# Patient Record
Sex: Female | Born: 1965
Health system: Southern US, Community
[De-identification: ages and names within clinical notes are randomized; demographics above are authoritative.]

## PROBLEM LIST (undated history)

## (undated) ENCOUNTER — Emergency Department (HOSPITAL_BASED_OUTPATIENT_CLINIC_OR_DEPARTMENT_OTHER)
Admission: EM | Disposition: A | Discharge status: Home / Self Care | Payer: Self-pay | Attending: Emergency Medicine | Admitting: Emergency Medicine

## (undated) DIAGNOSIS — F329 Major depressive disorder, single episode, unspecified: Secondary | ICD-10-CM

## (undated) DIAGNOSIS — E785 Hyperlipidemia, unspecified: Secondary | ICD-10-CM

## (undated) DIAGNOSIS — E669 Obesity, unspecified: Secondary | ICD-10-CM

## (undated) DIAGNOSIS — F32A Depression, unspecified: Secondary | ICD-10-CM

## (undated) DIAGNOSIS — K859 Acute pancreatitis without necrosis or infection, unspecified: Secondary | ICD-10-CM

## (undated) DIAGNOSIS — E119 Type 2 diabetes mellitus without complications: Secondary | ICD-10-CM

## (undated) DIAGNOSIS — J45909 Unspecified asthma, uncomplicated: Secondary | ICD-10-CM

## (undated) DIAGNOSIS — D496 Neoplasm of unspecified behavior of brain: Secondary | ICD-10-CM

## (undated) DIAGNOSIS — Z8719 Personal history of other diseases of the digestive system: Secondary | ICD-10-CM

## (undated) DIAGNOSIS — G629 Polyneuropathy, unspecified: Secondary | ICD-10-CM

## (undated) DIAGNOSIS — I10 Essential (primary) hypertension: Secondary | ICD-10-CM

## (undated) DIAGNOSIS — L0390 Cellulitis, unspecified: Secondary | ICD-10-CM

## (undated) HISTORY — PX: SHOULDER ARTHROSCOPY W/ ROTATOR CUFF REPAIR: SHX2400

## (undated) HISTORY — PX: ABDOMINAL HYSTERECTOMY: SHX81

## (undated) HISTORY — PX: BREAST CYST EXCISION: SHX579

---

## 1999-10-17 ENCOUNTER — Other Ambulatory Visit: Admission: RE | Admit: 1999-10-17 | Discharge: 1999-10-17 | Payer: Self-pay | Admitting: Obstetrics

## 1999-10-25 ENCOUNTER — Ambulatory Visit (HOSPITAL_COMMUNITY): Admission: RE | Admit: 1999-10-25 | Discharge: 1999-10-25 | Payer: Self-pay | Admitting: Obstetrics

## 1999-10-25 HISTORY — PX: TUBAL LIGATION: SHX77

## 2001-08-08 ENCOUNTER — Emergency Department (HOSPITAL_COMMUNITY): Admission: EM | Admit: 2001-08-08 | Discharge: 2001-08-08 | Payer: Self-pay | Admitting: Emergency Medicine

## 2002-12-16 ENCOUNTER — Emergency Department (HOSPITAL_COMMUNITY): Admission: EM | Admit: 2002-12-16 | Discharge: 2002-12-17 | Payer: Self-pay | Admitting: Emergency Medicine

## 2008-11-07 ENCOUNTER — Emergency Department (HOSPITAL_COMMUNITY): Admission: EM | Admit: 2008-11-07 | Discharge: 2008-11-08 | Payer: Self-pay | Admitting: Emergency Medicine

## 2010-12-08 LAB — BASIC METABOLIC PANEL
BUN: 7 mg/dL (ref 6–23)
CO2: 30 mEq/L (ref 19–32)
Calcium: 9.3 mg/dL (ref 8.4–10.5)
Chloride: 96 mEq/L (ref 96–112)
Creatinine, Ser: 0.62 mg/dL (ref 0.4–1.2)
GFR calc Af Amer: >60 mL/min (ref >60)
GFR calc non Af Amer: >60 mL/min (ref >60)
Glucose, Bld: 173 mg/dL — ABNORMAL HIGH (ref 70–99)
Potassium: 2.8 mEq/L — ABNORMAL LOW (ref 3.5–5.1)
Sodium: 136 mEq/L (ref 135–145)

## 2010-12-08 LAB — DIFFERENTIAL
Basophils Absolute: 0.1 10*3/uL (ref 0.0–0.1)
Basophils Relative: 1 % (ref 0–1)
Eosinophils Absolute: 0.1 10*3/uL (ref 0.0–0.7)
Eosinophils Relative: 2 % (ref 0–5)
Lymphocytes Relative: 26 % (ref 12–46)
Lymphs Abs: 1.9 10*3/uL (ref 0.7–4.0)
Monocytes Absolute: 0.6 10*3/uL (ref 0.1–1.0)
Monocytes Relative: 8 % (ref 3–12)
Neutro Abs: 4.7 10*3/uL (ref 1.7–7.7)
Neutrophils Relative %: 64 % (ref 43–77)

## 2010-12-08 LAB — CBC
HCT: 48.3 % — ABNORMAL HIGH (ref 36.0–46.0)
Hemoglobin: 16.9 g/dL — ABNORMAL HIGH (ref 12.0–15.0)
MCHC: 35 g/dL (ref 30.0–36.0)
MCV: 85.6 fL (ref 78.0–100.0)
Platelets: 349 10*3/uL (ref 150–400)
RBC: 5.64 MIL/uL — ABNORMAL HIGH (ref 3.87–5.11)
RDW: 12.8 % (ref 11.5–15.5)
WBC: 7.4 10*3/uL (ref 4.0–10.5)

## 2010-12-08 LAB — GLUCOSE, CAPILLARY: Glucose-Capillary: 206 mg/dL — ABNORMAL HIGH (ref 70–99)

## 2011-01-13 NOTE — Op Note (Signed)
[facility]  Patient:    Barbara Wang, Barbara Wang                MRN: [id number] Proc. Date: 10/25/99 Adm. Date:  [id number] Attending:  Venita Sheffield                           Operative Report  PREOPERATIVE DIAGNOSIS:       Multiparity.  POSTOPERATIVE DIAGNOSIS:  PROCEDURE:                    Open laparoscopic tubal sterilization.  SURGEON:                      Kathreen Cosier, M.D.  ANESTHESIA:                   General anesthesia.  DESCRIPTION OF PROCEDURE:     Under general anesthesia, patient in lithotomy position, abdomen, perineum and vagina were prepped and draped and bladder emptied with a straight catheter.  A weighted speculum was placed in the vagina and a Hulka tenaculum inserted in the cervix.  In the umbilicus, a transverse incision was ade and carried down to the fascia.  The fascia was cleaned and grasped with two Kochers and the fascia and the peritoneum opened with the Mayo scissors.  The sleeve of the trocar was inserted intraperitoneally.  Three liters of CO2 were infused intraperitoneally.  The visualizing scope was inserted through the sleeve of the scope; uterus, tubes and ovaries were normal.  The cautery probe was inserted through the sleeve of the scope and the right tube grasped one inch from the cornu.  The tube was traced to the fimbria.  Starting one inch from the cornu, the tube was cauterized; it was cauterized in a total of five places, moving lateral from the first site of cautery; approximately two inches of tube cauterized.  The procedure was done in the exact fashion on the other side. Probes was removed, CO2 allowed to escape from the peritoneal cavity, the fascia closed with one stitch of 0 Dexon and the skin closed with subcuticular stitch of 3-0 plain.  Patient tolerated her procedure well and taken to recovery room in good  condition. DD:  10/25/99 TD:  10/26/99 Job:  [id number]

## 2011-06-03 ENCOUNTER — Inpatient Hospital Stay (INDEPENDENT_AMBULATORY_CARE_PROVIDER_SITE_OTHER)
Admission: RE | Admit: 2011-06-03 | Discharge: 2011-06-03 | Disposition: A | Discharge status: Home / Self Care | Payer: Self-pay | Source: Ambulatory Visit | Attending: Emergency Medicine | Admitting: Emergency Medicine

## 2011-06-03 DIAGNOSIS — K089 Disorder of teeth and supporting structures, unspecified: Secondary | ICD-10-CM

## 2011-10-16 ENCOUNTER — Emergency Department (INDEPENDENT_AMBULATORY_CARE_PROVIDER_SITE_OTHER): Payer: Self-pay

## 2011-10-16 ENCOUNTER — Emergency Department (HOSPITAL_COMMUNITY)
Admission: EM | Admit: 2011-10-16 | Discharge: 2011-10-16 | Disposition: A | Discharge status: Home / Self Care | Payer: Self-pay | Source: Home / Self Care

## 2011-10-16 ENCOUNTER — Emergency Department (HOSPITAL_COMMUNITY): Payer: Self-pay

## 2011-10-16 ENCOUNTER — Encounter (HOSPITAL_COMMUNITY): Payer: Self-pay | Admitting: *Deleted

## 2011-10-16 DIAGNOSIS — S93409A Sprain of unspecified ligament of unspecified ankle, initial encounter: Secondary | ICD-10-CM

## 2011-10-16 DIAGNOSIS — J039 Acute tonsillitis, unspecified: Secondary | ICD-10-CM

## 2011-10-16 HISTORY — DX: Essential (primary) hypertension: I10

## 2011-10-16 LAB — POCT RAPID STREP A: Streptococcus, Group A Screen (Direct): NEGATIVE

## 2011-10-16 MED ORDER — AMOXICILLIN 500 MG PO CAPS: 500.0000 mg | ORAL_CAPSULE | Freq: Two times a day (BID) | ORAL | Status: AC

## 2011-10-16 MED ORDER — TRAMADOL HCL 50 MG PO TABS: 50.0000 mg | ORAL_TABLET | Freq: Four times a day (QID) | ORAL | Status: AC | PRN

## 2011-10-16 NOTE — ED Provider Notes (Signed)
Barbara Wang is a 46 y.o. female who presents to Urgent Care today for sore throat and for a fall.  Sore throat has been present x 1 day only.  Pain radiates to Left ear.  She has no preceding URI or other illness.  Eating and drinking well, handling secretions, no difficulty breathing.  She also suffered a fall 3 days ago.  Was out walking in front yard, slipped on low wall, fell on ground.  She was able to stand and walk back in house.  She has been using boots since then for ankle support.  Taking Ibuprofen with inconsistent relief.  She has also been using ice for relief.  Wants to make sure she did not suffer fracture.    Also of note, blood pressure elevated here at Urgent Care, see A&P below.  PMH reviewed.  ROS as above otherwise neg Medications reviewed. No current facility-administered medications for this encounter.   Current Outpatient Prescriptions  Medication Sig Dispense Refill  . insulin aspart (NOVOLOG) 100 UNIT/ML injection Inject into the skin. Sliding scale      . insulin glargine (LANTUS) 100 UNIT/ML injection Inject 60 Units into the skin 2 (two) times daily.      Marland Kitchen lisinopril-hydrochlorothiazide (PRINZIDE,ZESTORETIC) 10-12.5 MG per tablet Take 1 tablet by mouth daily.        Exam:  BP 178/95  Pulse 88  Temp(Src) 98.4 F (36.9 C) (Oral)  Resp 20  SpO2 99% Gen: Well NAD HEENT: EOMI,  MMM.  Left tonsil with erythema and white exudates noted, moderately enlarged.  Right tonsil WNL.  Left TM erythematous, although able to visualize landmarks beyond TM.  Ear canal clear.  Right ear WNL.   Lungs: CTABL Nl WOB Heart: RRR no MRG Abd: NABS, NT, ND Exts: Non edematous BL  LE, warm and well perfused.   Assessment and Plan: 1.  Tonsillitis:  Likely diagnosis based on presentation and fact she has erythema and exudates noted Left tonsil.  Plan short term course of Amoxicillin for relief.  FU if no improvement or symptoms worsen.  She is talking well and handling  secretions/airway well, do not think anything like peritonsillar abscess.    2.  Ankle sprain:  Rest, ice, compression, elevation for relief.  Also prescribed Tramadol for pain relief.  She is to continue weight bearing as tolerated.  To FU with PCP in 2 weeks to assess for improvement, or with Sports Medicine or [place] sooner if no improvement or symptoms worsen.  Able to bear weight both immediately and here at Sports Medicine, however she does have some mild tenderness over Right lateral malleolus.  X-rays negative.  Fracture unlikely.   3.  Elevated blood pressure:  Patient has not taken BP meds for several days.  Recommended she take one at home today and take them regularly.  No red flags by history or exam.  FU with PCP.

## 2011-10-16 NOTE — ED Notes (Addendum)
Reports stumbling over landscape border on Saturday - c/o continued right ankle and dorsal foot tenderness and painful weight-bearing.  Has also had productive cough x 1 wk; started w/ sore throat last night and severe left earache today.  Has felt feverish.  Has been taking Tyl, Tyl PM, and Nyquil without relief.  Exudate noted on tonsils.

## 2011-10-16 NOTE — Discharge Instructions (Signed)
Tonsillitis Tonsils are lumps of lymphoid tissues at the back of the throat. Each tonsil has 20 crevices (crypts). Tonsils help fight nose and throat infections and keep infection from spreading to other parts of the body for the first 18 months of life. Tonsillitis is an infection of the throat that causes the tonsils to become red, tender, and swollen. CAUSES Sudden and, if treated, temporary (acute) tonsillitis is usually caused by infection with streptococcal bacteria. Long lasting (chronic) tonsillitis occurs when the crypts of the tonsils become filled with pieces of food and bacteria, which makes it easy for the tonsils to become constantly infected. SYMPTOMS  Symptoms of tonsillitis include:  A sore throat.   White patches on the tonsils.   Fever.   Tiredness.  DIAGNOSIS Tonsillitis can be diagnosed through a physical exam. Diagnosis can be confirmed with the results of lab tests, including a throat culture. TREATMENT  The goals of tonsillitis treatment include the reduction of the severity and duration of symptoms, prevention of associated conditions, and prevention of disease transmission. Tonsillitis caused by bacteria can be treated with antibiotics. Usually, treatment with antibiotics is started before the cause of the tonsillitis is known. However, if it is determined that the cause is not bacterial, antibiotics will not treat the tonsillitis. If attacks of tonsillitis are severe and frequent, your caregiver may recommend surgery to remove the tonsils (tonsillectomy). HOME CARE INSTRUCTIONS   Rest as much as possible and get plenty of sleep.   Drink plenty of fluids. While the throat is very sore, eat soft foods or liquids, such as sherbet, soups, or instant breakfast drinks.   Eat frozen ice pops.   Older children and adults may gargle with a warm or cold liquid to help soothe the throat. Mix 1 teaspoon of salt in 1 cup of water.   Other family members who also develop a  sore throat or fever should have a medical exam or throat culture.   Only take over-the-counter or prescription medicines for pain, discomfort, or fever as directed by your caregiver.  SEEK MEDICAL CARE IF:   Your baby is older than 3 months with a rectal temperature of 100.5 F (38.1 C) or higher for more than 1 day.   Large, tender lumps develop in your neck.   A rash develops.   Green, yellow-brown, or bloody substance is coughed up.   You are unable to swallow liquids or food for 24 hours.   Your child is unable to swallow food or liquids for 12 hours.  SEEK IMMEDIATE MEDICAL CARE IF:   You develop any new symptoms such as vomiting, severe headache, stiff neck, chest pain, or trouble breathing or swallowing.   You have severe throat pain along with drooling or voice changes.   You have severe pain, unrelieved with recommended medications.   You are unable to fully open the mouth.   You develop redness, swelling, or severe pain anywhere in the neck.   You have a fever.   Your baby is older than 3 months with a rectal temperature of 102 F (38.9 C) or higher.   Your baby is 6 months old or younger with a rectal temperature of 100.4 F (38 C) or higher.  MAKE SURE YOU:   Understand these instructions.   Will watch your condition.   Will get help right away if you are not doing well or get worse.  Document Released: 05/24/2005 Document Revised: 04/26/2011 Document Reviewed: 10/20/2010 [facility] Patient Information 2012 [employer]  Ankle Sprain You have a sprained ankle. When you twist or sprain your ankle, the ligaments that hold the joint together are injured. This usually causes a lot of swelling and pain. Although these injuries can be quite severe, proper treatment will reduce your pain, shorten the period of disability, and help prevent re-injury. To treat a sprained ankle you should:  Elevate your ankle for the next 2 to 4 days.   During this period apply  ice packs to the injury for 20 to 30 minutes every 2 to 3 hours.   Keep the ankle wrapped in a compression bandage or splint as long as it is painful or swollen.   Do not walk on your ankle if it still hurts. This can slow the healing. Gentle range of motion exercises, however, can help decrease disability.   Use crutches if necessary until weight bearing is painless.   Prescription pain medicine may be needed to relieve discomfort.  A plaster or fiberglass splint may be applied initially. As your sprain improves, air, foam or gel-lined braces can be used to protect the ankle from further injury until the joint is completely healed. Ankle rehabilitation exercises may also be used to speed your recovery and make the joint more stable. Most moderate ankle sprains will heal completely in 6 weeks. However, if the sprain is severe, a cast or even surgery may be needed. Restrict your activities and see your doctor for follow-up as advised. If you have persistent pain, further evaluation and x-rays may be needed. Document Released: 09/21/2004 Document Revised: 04/26/2011 Document Reviewed: 08/15/2008 [facility] Patient Information 2012 [place], [place].

## 2011-10-18 NOTE — ED Provider Notes (Signed)
Medical screening examination/treatment/procedure(s) were performed by resident physician and as supervising physician I was immediately available for consultation/collaboration.  Richardo Priest, MD  Richardo Priest, MD 10/18/11 2249

## 2012-04-29 ENCOUNTER — Emergency Department (HOSPITAL_COMMUNITY)
Admission: EM | Admit: 2012-04-29 | Discharge: 2012-04-29 | Disposition: A | Discharge status: Home / Self Care | Payer: Self-pay | Attending: Emergency Medicine | Admitting: Emergency Medicine

## 2012-04-29 ENCOUNTER — Encounter (HOSPITAL_COMMUNITY): Payer: Self-pay | Admitting: Family Medicine

## 2012-04-29 DIAGNOSIS — K859 Acute pancreatitis without necrosis or infection, unspecified: Secondary | ICD-10-CM | POA: Insufficient documentation

## 2012-04-29 DIAGNOSIS — R109 Unspecified abdominal pain: Secondary | ICD-10-CM | POA: Insufficient documentation

## 2012-04-29 HISTORY — DX: Acute pancreatitis without necrosis or infection, unspecified: K85.90

## 2012-04-29 LAB — URINALYSIS, ROUTINE W REFLEX MICROSCOPIC
Bilirubin Urine: NEGATIVE
Glucose, UA: >1000 mg/dL — AB
Hgb urine dipstick: NEGATIVE
Ketones, ur: 15 mg/dL — AB
Nitrite: NEGATIVE
Protein, ur: NEGATIVE mg/dL
Specific Gravity, Urine: 1.019 (ref 1.005–1.030)
Urobilinogen, UA: 0.2 mg/dL (ref 0.0–1.0)
pH: 6 (ref 5.0–8.0)

## 2012-04-29 LAB — COMPREHENSIVE METABOLIC PANEL
ALT: 18 U/L (ref 0–35)
AST: 27 U/L (ref 0–37)
Albumin: 3.5 g/dL (ref 3.5–5.2)
Alkaline Phosphatase: 103 U/L (ref 39–117)
BUN: 10 mg/dL (ref 6–23)
CO2: 30 mEq/L (ref 19–32)
Calcium: 9.9 mg/dL (ref 8.4–10.5)
Chloride: 97 mEq/L (ref 96–112)
Creatinine, Ser: 0.59 mg/dL (ref 0.50–1.10)
GFR calc Af Amer: >90 mL/min (ref >90)
GFR calc non Af Amer: >90 mL/min (ref >90)
Glucose, Bld: 304 mg/dL — ABNORMAL HIGH (ref 70–99)
Potassium: 3.5 mEq/L (ref 3.5–5.1)
Sodium: 135 mEq/L (ref 135–145)
Total Bilirubin: 0.4 mg/dL (ref 0.3–1.2)
Total Protein: 7.7 g/dL (ref 6.0–8.3)

## 2012-04-29 LAB — CBC WITH DIFFERENTIAL/PLATELET
Basophils Absolute: 0 10*3/uL (ref 0.0–0.1)
Basophils Relative: 1 % (ref 0–1)
Eosinophils Absolute: 0.3 10*3/uL (ref 0.0–0.7)
Eosinophils Relative: 4 % (ref 0–5)
HCT: 39.3 % (ref 36.0–46.0)
Hemoglobin: 13.3 g/dL (ref 12.0–15.0)
Lymphocytes Relative: 31 % (ref 12–46)
Lymphs Abs: 2 10*3/uL (ref 0.7–4.0)
MCH: 29.2 pg (ref 26.0–34.0)
MCHC: 33.8 g/dL (ref 30.0–36.0)
MCV: 86.2 fL (ref 78.0–100.0)
Monocytes Absolute: 0.7 10*3/uL (ref 0.1–1.0)
Monocytes Relative: 10 % (ref 3–12)
Neutro Abs: 3.4 10*3/uL (ref 1.7–7.7)
Neutrophils Relative %: 54 % (ref 43–77)
Platelets: 405 10*3/uL — ABNORMAL HIGH (ref 150–400)
RBC: 4.56 MIL/uL (ref 3.87–5.11)
RDW: 13.2 % (ref 11.5–15.5)
WBC: 6.3 10*3/uL (ref 4.0–10.5)

## 2012-04-29 LAB — URINE MICROSCOPIC-ADD ON

## 2012-04-29 LAB — LIPASE, BLOOD: Lipase: 84 U/L — ABNORMAL HIGH (ref 11–59)

## 2012-04-29 NOTE — ED Notes (Addendum)
Pt complaining of upper abdominal pain x 1 month. Hx of pancreatitis. N,V. Recently admitted for the same.

## 2012-05-09 ENCOUNTER — Emergency Department (HOSPITAL_COMMUNITY): Payer: Self-pay

## 2012-05-09 ENCOUNTER — Inpatient Hospital Stay (HOSPITAL_COMMUNITY)
Admission: EM | Admit: 2012-05-09 | Discharge: 2012-05-12 | DRG: 417 | Disposition: A | Discharge status: Home / Self Care | Payer: MEDICAID | Attending: Internal Medicine | Admitting: Internal Medicine

## 2012-05-09 ENCOUNTER — Encounter (HOSPITAL_COMMUNITY): Payer: Self-pay | Admitting: Emergency Medicine

## 2012-05-09 DIAGNOSIS — F17200 Nicotine dependence, unspecified, uncomplicated: Secondary | ICD-10-CM | POA: Diagnosis present

## 2012-05-09 DIAGNOSIS — Z90710 Acquired absence of both cervix and uterus: Secondary | ICD-10-CM

## 2012-05-09 DIAGNOSIS — IMO0001 Reserved for inherently not codable concepts without codable children: Secondary | ICD-10-CM | POA: Diagnosis present

## 2012-05-09 DIAGNOSIS — F329 Major depressive disorder, single episode, unspecified: Secondary | ICD-10-CM | POA: Diagnosis present

## 2012-05-09 DIAGNOSIS — E876 Hypokalemia: Secondary | ICD-10-CM | POA: Diagnosis present

## 2012-05-09 DIAGNOSIS — K828 Other specified diseases of gallbladder: Principal | ICD-10-CM | POA: Diagnosis present

## 2012-05-09 DIAGNOSIS — F3289 Other specified depressive episodes: Secondary | ICD-10-CM | POA: Diagnosis present

## 2012-05-09 DIAGNOSIS — R109 Unspecified abdominal pain: Secondary | ICD-10-CM | POA: Diagnosis present

## 2012-05-09 DIAGNOSIS — E119 Type 2 diabetes mellitus without complications: Secondary | ICD-10-CM

## 2012-05-09 DIAGNOSIS — Z882 Allergy status to sulfonamides status: Secondary | ICD-10-CM

## 2012-05-09 DIAGNOSIS — K859 Acute pancreatitis without necrosis or infection, unspecified: Secondary | ICD-10-CM | POA: Diagnosis present

## 2012-05-09 DIAGNOSIS — I10 Essential (primary) hypertension: Secondary | ICD-10-CM | POA: Diagnosis present

## 2012-05-09 DIAGNOSIS — Z794 Long term (current) use of insulin: Secondary | ICD-10-CM

## 2012-05-09 DIAGNOSIS — Z880 Allergy status to penicillin: Secondary | ICD-10-CM

## 2012-05-09 LAB — COMPREHENSIVE METABOLIC PANEL
ALT: 13 U/L (ref 0–35)
AST: 16 U/L (ref 0–37)
Albumin: 3.5 g/dL (ref 3.5–5.2)
Alkaline Phosphatase: 79 U/L (ref 39–117)
BUN: 6 mg/dL (ref 6–23)
CO2: 27 mEq/L (ref 19–32)
Calcium: 9.8 mg/dL (ref 8.4–10.5)
Chloride: 101 mEq/L (ref 96–112)
Creatinine, Ser: 0.49 mg/dL — ABNORMAL LOW (ref 0.50–1.10)
GFR calc Af Amer: >90 mL/min (ref >90)
GFR calc non Af Amer: >90 mL/min (ref >90)
Glucose, Bld: 269 mg/dL — ABNORMAL HIGH (ref 70–99)
Potassium: 3.3 mEq/L — ABNORMAL LOW (ref 3.5–5.1)
Sodium: 138 mEq/L (ref 135–145)
Total Bilirubin: 0.3 mg/dL (ref 0.3–1.2)
Total Protein: 7.3 g/dL (ref 6.0–8.3)

## 2012-05-09 LAB — CBC WITH DIFFERENTIAL/PLATELET
Basophils Absolute: 0 10*3/uL (ref 0.0–0.1)
Basophils Relative: 0 % (ref 0–1)
Eosinophils Absolute: 0.1 10*3/uL (ref 0.0–0.7)
Eosinophils Relative: 1 % (ref 0–5)
HCT: 39.4 % (ref 36.0–46.0)
Hemoglobin: 13.8 g/dL (ref 12.0–15.0)
Lymphocytes Relative: 22 % (ref 12–46)
Lymphs Abs: 1.7 10*3/uL (ref 0.7–4.0)
MCH: 29.6 pg (ref 26.0–34.0)
MCHC: 35 g/dL (ref 30.0–36.0)
MCV: 84.4 fL (ref 78.0–100.0)
Monocytes Absolute: 0.5 10*3/uL (ref 0.1–1.0)
Monocytes Relative: 7 % (ref 3–12)
Neutro Abs: 5.3 10*3/uL (ref 1.7–7.7)
Neutrophils Relative %: 70 % (ref 43–77)
Platelets: 388 10*3/uL (ref 150–400)
RBC: 4.67 MIL/uL (ref 3.87–5.11)
RDW: 13.2 % (ref 11.5–15.5)
WBC: 7.5 10*3/uL (ref 4.0–10.5)

## 2012-05-09 LAB — URINE MICROSCOPIC-ADD ON

## 2012-05-09 LAB — ETHANOL: Alcohol, Ethyl (B): <11 mg/dL (ref 0–11)

## 2012-05-09 LAB — LIPID PANEL
Cholesterol: 101 mg/dL (ref 0–200)
HDL: 34 mg/dL — ABNORMAL LOW (ref >39)
LDL Cholesterol: 45 mg/dL (ref 0–99)
Total CHOL/HDL Ratio: 3 RATIO
Triglycerides: 109 mg/dL (ref <150)
VLDL: 22 mg/dL (ref 0–40)

## 2012-05-09 LAB — URINALYSIS, ROUTINE W REFLEX MICROSCOPIC
Glucose, UA: >1000 mg/dL — AB
Hgb urine dipstick: NEGATIVE
Ketones, ur: NEGATIVE mg/dL
Nitrite: NEGATIVE
Protein, ur: NEGATIVE mg/dL
Specific Gravity, Urine: 1.02 (ref 1.005–1.030)
Urobilinogen, UA: 1 mg/dL (ref 0.0–1.0)
pH: 6 (ref 5.0–8.0)

## 2012-05-09 LAB — GLUCOSE, CAPILLARY
Glucose-Capillary: 334 mg/dL — ABNORMAL HIGH (ref 70–99)
Glucose-Capillary: 279 mg/dL — ABNORMAL HIGH (ref 70–99)

## 2012-05-09 LAB — MAGNESIUM: Magnesium: 1.7 mg/dL (ref 1.5–2.5)

## 2012-05-09 LAB — LIPASE, BLOOD: Lipase: 61 U/L — ABNORMAL HIGH (ref 11–59)

## 2012-05-09 LAB — PHOSPHORUS: Phosphorus: 3.2 mg/dL (ref 2.3–4.6)

## 2012-05-09 MED ORDER — SODIUM CHLORIDE 0.9 % IV BOLUS (SEPSIS)
1000.0000 mL | Freq: Once | INTRAVENOUS | Status: AC
  Administered 2012-05-09: 1000 mL via INTRAVENOUS

## 2012-05-09 MED ORDER — METOCLOPRAMIDE HCL 5 MG/ML IJ SOLN
10.0000 mg | Freq: Once | INTRAMUSCULAR | Status: AC
  Administered 2012-05-09: 10 mg via INTRAVENOUS
  Filled 2012-05-09: qty 2

## 2012-05-09 MED ORDER — DEXTROSE 5 % IV SOLN
1.0000 g | Freq: Once | INTRAVENOUS | Status: AC
  Administered 2012-05-09: 1 g via INTRAVENOUS
  Filled 2012-05-09: qty 10

## 2012-05-09 MED ORDER — INSULIN GLARGINE 100 UNIT/ML ~~LOC~~ SOLN
40.0000 [IU] | Freq: Two times a day (BID) | SUBCUTANEOUS | Status: DC
  Administered 2012-05-09 – 2012-05-10 (×2): 40 [IU] via SUBCUTANEOUS
  Filled 2012-05-09: qty 1

## 2012-05-09 MED ORDER — METOCLOPRAMIDE HCL 5 MG/ML IJ SOLN
10.0000 mg | Freq: Three times a day (TID) | INTRAMUSCULAR | Status: DC
  Administered 2012-05-10 – 2012-05-11 (×3): 10 mg via INTRAVENOUS
  Filled 2012-05-09 (×7): qty 2

## 2012-05-09 MED ORDER — SODIUM CHLORIDE 0.9 % IV SOLN: INTRAVENOUS | Status: DC

## 2012-05-09 MED ORDER — ENOXAPARIN SODIUM 30 MG/0.3ML ~~LOC~~ SOLN
30.0000 mg | SUBCUTANEOUS | Status: DC
  Filled 2012-05-09: qty 0.3

## 2012-05-09 MED ORDER — ONDANSETRON HCL 4 MG/2ML IJ SOLN
4.0000 mg | Freq: Once | INTRAMUSCULAR | Status: AC
  Administered 2012-05-09: 4 mg via INTRAVENOUS
  Filled 2012-05-09: qty 2

## 2012-05-09 MED ORDER — ONDANSETRON HCL 4 MG/2ML IJ SOLN: 4.0000 mg | Freq: Four times a day (QID) | INTRAMUSCULAR | Status: DC | PRN

## 2012-05-09 MED ORDER — POTASSIUM CHLORIDE CRYS ER 20 MEQ PO TBCR
40.0000 meq | EXTENDED_RELEASE_TABLET | Freq: Once | ORAL | Status: AC
  Administered 2012-05-09: 40 meq via ORAL
  Filled 2012-05-09: qty 2

## 2012-05-09 MED ORDER — INSULIN ASPART 100 UNIT/ML ~~LOC~~ SOLN: 4.0000 [IU] | Freq: Three times a day (TID) | SUBCUTANEOUS | Status: DC

## 2012-05-09 MED ORDER — ASPIRIN 81 MG PO CHEW
CHEWABLE_TABLET | ORAL | Status: AC
  Filled 2012-05-09: qty 1

## 2012-05-09 MED ORDER — INSULIN ASPART 100 UNIT/ML ~~LOC~~ SOLN
0.0000 [IU] | Freq: Three times a day (TID) | SUBCUTANEOUS | Status: DC
  Administered 2012-05-10: 2 [IU] via SUBCUTANEOUS
  Administered 2012-05-11: 3 [IU] via SUBCUTANEOUS
  Administered 2012-05-12: 5 [IU] via SUBCUTANEOUS

## 2012-05-09 MED ORDER — ASPIRIN EC 81 MG PO TBEC
81.0000 mg | DELAYED_RELEASE_TABLET | Freq: Every day | ORAL | Status: DC
  Administered 2012-05-12: 81 mg via ORAL
  Filled 2012-05-09 (×4): qty 1

## 2012-05-09 MED ORDER — HYDROMORPHONE HCL PF 1 MG/ML IJ SOLN
0.5000 mg | INTRAMUSCULAR | Status: DC | PRN
  Administered 2012-05-10 (×2): 0.5 mg via INTRAVENOUS
  Filled 2012-05-09 (×2): qty 1

## 2012-05-09 MED ORDER — HYDROMORPHONE HCL PF 1 MG/ML IJ SOLN
1.0000 mg | Freq: Once | INTRAMUSCULAR | Status: AC
  Administered 2012-05-09: 1 mg via INTRAVENOUS
  Filled 2012-05-09: qty 1

## 2012-05-09 MED ORDER — INSULIN ASPART 100 UNIT/ML ~~LOC~~ SOLN
4.0000 [IU] | Freq: Once | SUBCUTANEOUS | Status: AC
  Administered 2012-05-09: 4 [IU] via INTRAVENOUS
  Filled 2012-05-09: qty 1

## 2012-05-09 MED ORDER — HYDROCODONE-ACETAMINOPHEN 5-325 MG PO TABS
1.0000 | ORAL_TABLET | ORAL | Status: DC | PRN
  Administered 2012-05-10 – 2012-05-11 (×4): 2 via ORAL
  Administered 2012-05-11: 1 via ORAL
  Administered 2012-05-11 – 2012-05-12 (×3): 2 via ORAL
  Filled 2012-05-09: qty 2
  Filled 2012-05-09: qty 1
  Filled 2012-05-09 (×2): qty 2
  Filled 2012-05-09: qty 1
  Filled 2012-05-09 (×3): qty 2
  Filled 2012-05-09: qty 1

## 2012-05-09 MED ORDER — DEXTROSE 5 % IV SOLN
1.0000 g | INTRAVENOUS | Status: DC
  Filled 2012-05-09: qty 10

## 2012-05-09 MED ORDER — ONDANSETRON HCL 4 MG PO TABS: 4.0000 mg | ORAL_TABLET | Freq: Four times a day (QID) | ORAL | Status: DC | PRN

## 2012-05-09 NOTE — ED Notes (Signed)
Pt to ultrasound, ABC's intact 

## 2012-05-09 NOTE — ED Notes (Signed)
Pain currently 10/10 sharp.  Take pain medication at home and feels like that is just a band aid because pain returns.  Last BM 3 days ago and took a stool softener with no relief one day ago.

## 2012-05-09 NOTE — ED Notes (Signed)
Patient remains alert and oriented.  Updated patient on info

## 2012-05-09 NOTE — H&P (Signed)
[facility] History and Physical  Barbara Wang ZOX:[id number] DOB: [birth year] DOA: 05/09/2012  Referring physician: ED physician PCP: Sheila Oats, MD   Chief Complaint: Abdominal pain  HPI:  Pt is 46 yo female who presents with generalized epigastric abdominal pain, intermittent and colicky in nature, 10/10 in severity when present, associated with poor oral intake and nausea but no vomiting. Pt reports similar episodes in the past and was just recently seen at [facility] and was told she has pancreatitis but the exact cause was not clear. She denies drug or alcohol use. Pt denies any fevers, chills, no specific aggravating or alleviating factors, no urinary concern but tells she has had UTI and was started on medical regimen but has not completed the therapy.  Assessment and Plan:  Principal Problem:  *Abdominal  pain, other specified site - possibly related to pancreatitis but it is not clear to me why is this recurrent problem for pt  - will admit to medical floor for further evaluation - will check UDS, HIV, alcohol level and lipid panel - will start with providing supportive care for now with IVF, analgesia and antiemetics as needed - keep NPO for now  Active Problems:  Diabetes mellitus - will check A1C - continue home medication regimen    HTN (hypertension) - will continue home medication regimen - will continue to monitor vitals per floor protocol   Hypokalemia - will check Mg level  - will supplement - BMP in AM   ? UTI - will treat with empiric Rocephin and readjust when urine culture is back  Code Status: Full Family Communication: Pt at bedside Disposition Plan: Admit to medical floor   Review of Systems:  Constitutional: Negative for fever, chills and malaise/fatigue. Negative for diaphoresis.  HENT: Negative for hearing loss, ear pain, nosebleeds, congestion, sore throat, neck pain, tinnitus and ear discharge.   Eyes: Negative  for blurred vision, double vision, photophobia, pain, discharge and redness.  Respiratory: Negative for cough, hemoptysis, sputum production, shortness of breath, wheezing and stridor.   Cardiovascular: Negative for chest pain, palpitations, orthopnea, claudication and leg swelling.  Gastrointestinal: Positive for nausea, and abdominal pain. Negative for heartburn, constipation, blood in stool and melena.  Genitourinary: Negative for dysuria, urgency, frequency, hematuria and flank pain.  Musculoskeletal: Negative for myalgias, back pain, joint pain and falls.  Skin: Negative for itching and rash.  Neurological: Negative for dizziness and weakness. Negative for tingling, tremors, sensory change, speech change, focal weakness, loss of consciousness and headaches.  Endo/Heme/Allergies: Negative for environmental allergies and polydipsia. Does not bruise/bleed easily.  Psychiatric/Behavioral: Negative for suicidal ideas. The patient is not nervous/anxious.      Past Medical History  Diagnosis Date  . Diabetes mellitus   . Hypertension   . Pancreatitis     Past Surgical History  Procedure Date  . Cesarean section     x2  . Tubal ligation   . Abdominal hysterectomy   . Rotator cuff repair     Social History:  reports that she has been smoking.  She does not have any smokeless tobacco history on file. She reports that she does not drink alcohol or use illicit drugs.  Allergies  Allergen Reactions  . Penicillins Hives  . Sulfa Antibiotics Hives  . Tramadol Hives   No family history of heart disease or cancers.    Medication Sig  aspirin EC 81 MG tablet Take 81 mg by mouth daily.  insulin aspart (NOVOLOG) 100 UNIT/ML injection Inject 4-10  Units into the skin 3 (three) times daily before meals. Sliding scale  insulin glargine (LANTUS) 100 UNIT/ML injection Inject 40 Units into the skin 2 (two) times daily.     Physical Exam: Filed Vitals:   05/09/12 1319 05/09/12 1619 05/09/12  1957  BP: 142/77 135/84 123/63  Pulse: 90 84 73  Temp: 98.3 F (36.8 C) 98.6 F (37 C)   TempSrc: Oral Oral   Resp: 16 22 18   SpO2: 97% 100% 93%    Physical Exam  Constitutional: Appears well-developed and well-nourished. No distress.  HENT: Normocephalic. External right and left ear normal. Oropharynx is clear and moist.  Eyes: Conjunctivae and EOM are normal. PERRLA, no scleral icterus.  Neck: Normal ROM. Neck supple. No JVD. No tracheal deviation. No thyromegaly.  CVS: RRR, S1/S2 +, no murmurs, no gallops, no carotid bruit.  Pulmonary: Effort and breath sounds normal, no stridor, rhonchi, wheezes, rales.  Abdominal: Soft. BS +,  no distension, tenderness in epigastric area, no rebound or guarding.  Musculoskeletal: Normal range of motion. No edema and no tenderness.  Lymphadenopathy: No lymphadenopathy noted, cervical, inguinal. Neuro: Alert. Normal reflexes, muscle tone coordination. No cranial nerve deficit. Skin: Skin is warm and dry. No rash noted. Not diaphoretic. No erythema. No pallor.  Psychiatric: Normal mood and affect. Behavior, judgment, thought content normal.   Labs on Admission:  Basic Metabolic Panel:  Lab 05/09/12 1610  NA 138  K 3.3*  CL 101  CO2 27  GLUCOSE 269*  BUN 6  CREATININE 0.49*  CALCIUM 9.8  MG --  PHOS --   Liver Function Tests:  Lab 05/09/12 1349  AST 16  ALT 13  ALKPHOS 79  BILITOT 0.3  PROT 7.3  ALBUMIN 3.5    Lab 05/09/12 1349  LIPASE 61*  AMYLASE --   CBC:  Lab 05/09/12 1349  WBC 7.5  NEUTROABS 5.3  HGB 13.8  HCT 39.4  MCV 84.4  PLT 388   CBG:  Lab 05/09/12 2129 05/09/12 1945  GLUCAP 279* 334*    Radiological Exams on Admission: US Abdomen Complete  05/09/2012  *RADIOLOGY REPORT*  Clinical Data:  Abdominal pain, nausea and vomiting.  COMPLETE ABDOMINAL ULTRASOUND  Comparison:  Right upper quadrant abdomen ultrasound dated 04/13/2012 and abdomen CT dated 04/13/2012.  Findings:  Gallbladder:  No gallstones,  gallbladder wall thickening, or pericholecystic fluid.  Common bile duct:  Normal in caliber, measuring 4.9 mm in diameter proximally.  Liver:  No focal lesion identified.  Within normal limits in parenchymal echogenicity.  IVC:  Appears normal.  Pancreas:  Poorly visualized.  The visualized portions appear normal.  Spleen:  Normal, measuring 6.6 cm in length.  Right Kidney:  Normal, measuring 10.2 cm in length.  Left Kidney:  Normal, measuring 11.5 cm in length.  Abdominal aorta:  No aneurysm identified.  IMPRESSION: Normal examination.   Original Report Authenticated By: Darrol Angel, M.D.     EKG: Normal sinus rhythm, no ST/T wave changes  Debbora Presto, MD  [facility] Pager [phone]  If 7PM-7AM, please contact night-coverage [email or web address] Password [facility] 05/09/2012, 9:39 PM

## 2012-05-09 NOTE — ED Provider Notes (Signed)
History     CSN: [id number]  Arrival date & time 05/09/12  1315   First MD Initiated Contact with Patient 05/09/12 1807      Chief Complaint  Patient presents with  . Abdominal Pain    (Consider location/radiation/quality/duration/timing/severity/associated sxs/prior treatment) Patient is a 46 y.o. female presenting with abdominal pain. The history is provided by the patient. No language interpreter was used.  Abdominal Pain The primary symptoms of the illness include abdominal pain, nausea and vomiting. The primary symptoms of the illness do not include fever, diarrhea, vaginal discharge or vaginal bleeding. The current episode started more than 2 days ago. The onset of the illness was gradual. The problem has been gradually worsening.  Symptoms associated with the illness do not include heartburn, constipation, urgency, frequency or back pain. Significant associated medical issues include diabetes. Significant associated medical issues do not include gallstones.   46 year old female coming in with right upper quadrant epigastric pain nausea and vomiting especially after she eats the past 2 days. Patient had recent hospitalization or [facility] for pancreatitis and she states that her lipase was >1000. She also recently finished Macrobid for a UTI. Patient is an insulin-dependent diabetic and her sugars are also elevated. States that she has cut her Lantus down to half the dose in a.m. and p.m. She's also on a sliding scale. States she does have some burning with urination. Pain and nausea medicines are not helping at home so She came to the hospital.   Appears sick. Denies alcohol. She is a smoker.  Past Medical History  Diagnosis Date  . Diabetes mellitus   . Hypertension   . Pancreatitis     Past Surgical History  Procedure Date  . Cesarean section     x2  . Tubal ligation   . Abdominal hysterectomy   . Rotator cuff repair     No family history on  file.  History  Substance Use Topics  . Smoking status: Current Every Day Smoker -- 0.5 packs/day  . Smokeless tobacco: Not on file  . Alcohol Use: No    OB History    Grav Para Term Preterm Abortions TAB SAB Ect Mult Living                  Review of Systems  Constitutional: Negative.  Negative for fever.  HENT: Negative.   Eyes: Negative.   Respiratory: Negative.   Cardiovascular: Negative.   Gastrointestinal: Positive for nausea, vomiting and abdominal pain. Negative for heartburn, diarrhea, constipation and abdominal distention.  Genitourinary: Negative for urgency, frequency, vaginal bleeding and vaginal discharge.  Musculoskeletal: Negative for back pain.  Neurological: Negative.   Psychiatric/Behavioral: Negative.   All other systems reviewed and are negative.    Allergies  Penicillins; Sulfa antibiotics; and Tramadol  Home Medications   Current Outpatient Rx  Name Route Sig Dispense Refill  . ASPIRIN EC 81 MG PO TBEC Oral Take 81 mg by mouth daily.    . INSULIN ASPART 100 UNIT/ML [place] SOLN Subcutaneous Inject 4-10 Units into the skin 3 (three) times daily before meals. Sliding scale    . INSULIN GLARGINE 100 UNIT/ML [place] SOLN Subcutaneous Inject 40 Units into the skin 2 (two) times daily.     Marland Kitchen NITROFURANTOIN MONOHYD MACRO 100 MG PO CAPS Oral Take 100 mg by mouth 2 (two) times daily.      BP 123/63  Pulse 73  Temp 98.6 F (37 C) (Oral)  Resp 18  SpO2 93%  Physical Exam  Nursing note and vitals reviewed. Constitutional: She is oriented to person, place, and time. She appears well-developed and well-nourished.  HENT:  Head: Normocephalic and atraumatic.  Eyes: Conjunctivae normal and EOM are normal. Pupils are equal, round, and reactive to light.  Neck: Normal range of motion. Neck supple.  Cardiovascular: Normal rate.   Pulmonary/Chest: Effort normal and breath sounds normal. No respiratory distress.  Abdominal: Soft. Bowel sounds are normal. She exhibits  no distension and no mass. There is tenderness. There is no rebound and no guarding.       RUQ /epigastric pain  Genitourinary: No vaginal discharge found.  Musculoskeletal: Normal range of motion. She exhibits no edema and no tenderness.  Neurological: She is alert and oriented to person, place, and time. She has normal reflexes.  Skin: Skin is warm and dry.  Psychiatric: She has a normal mood and affect.    ED Course  Procedures (including critical care time)  Labs Reviewed  COMPREHENSIVE METABOLIC PANEL - Abnormal; Notable for the following:    Potassium 3.3 (*)     Glucose, Bld 269 (*)     Creatinine, Ser 0.49 (*)     All other components within normal limits  LIPASE, BLOOD - Abnormal; Notable for the following:    Lipase 61 (*)     All other components within normal limits  URINALYSIS, ROUTINE W REFLEX MICROSCOPIC - Abnormal; Notable for the following:    Color, Urine AMBER (*)  BIOCHEMICALS MAY BE AFFECTED BY COLOR   APPearance HAZY (*)     Glucose, UA >1000 (*)     Bilirubin Urine SMALL (*)     Leukocytes, UA SMALL (*)     All other components within normal limits  URINE MICROSCOPIC-ADD ON - Abnormal; Notable for the following:    Squamous Epithelial / LPF MANY (*)     Bacteria, UA FEW (*)     All other components within normal limits  GLUCOSE, CAPILLARY - Abnormal; Notable for the following:    Glucose-Capillary 334 (*)     All other components within normal limits  CBC WITH DIFFERENTIAL   US Abdomen Complete  05/09/2012  *RADIOLOGY REPORT*  Clinical Data:  Abdominal pain, nausea and vomiting.  COMPLETE ABDOMINAL ULTRASOUND  Comparison:  Right upper quadrant abdomen ultrasound dated 04/13/2012 and abdomen CT dated 04/13/2012.  Findings:  Gallbladder:  No gallstones, gallbladder wall thickening, or pericholecystic fluid.  Common bile duct:  Normal in caliber, measuring 4.9 mm in diameter proximally.  Liver:  No focal lesion identified.  Within normal limits in parenchymal  echogenicity.  IVC:  Appears normal.  Pancreas:  Poorly visualized.  The visualized portions appear normal.  Spleen:  Normal, measuring 6.6 cm in length.  Right Kidney:  Normal, measuring 10.2 cm in length.  Left Kidney:  Normal, measuring 11.5 cm in length.  Abdominal aorta:  No aneurysm identified.  IMPRESSION: Normal examination.   Original Report Authenticated By: Darrol Angel, M.D.      No diagnosis found.    MDM    Labs Reviewed  COMPREHENSIVE METABOLIC PANEL - Abnormal; Notable for the following:    Potassium 3.3 (*)     Glucose, Bld 269 (*)     Creatinine, Ser 0.49 (*)     All other components within normal limits  LIPASE, BLOOD - Abnormal; Notable for the following:    Lipase 61 (*)     All other components within normal limits  URINALYSIS, ROUTINE W  REFLEX MICROSCOPIC - Abnormal; Notable for the following:    Color, Urine AMBER (*)  BIOCHEMICALS MAY BE AFFECTED BY COLOR   APPearance HAZY (*)     Glucose, UA >1000 (*)     Bilirubin Urine SMALL (*)     Leukocytes, UA SMALL (*)     All other components within normal limits  URINE MICROSCOPIC-ADD ON - Abnormal; Notable for the following:    Squamous Epithelial / LPF MANY (*)     Bacteria, UA FEW (*)     All other components within normal limits  GLUCOSE, CAPILLARY - Abnormal; Notable for the following:    Glucose-Capillary 334 (*)     All other components within normal limits  GLUCOSE, CAPILLARY - Abnormal; Notable for the following:    Glucose-Capillary 279 (*)     All other components within normal limits  CBC WITH DIFFERENTIAL     46 year old female with right upper epigastric pain and positive lipase 61 with recent admission for pancreatitis. She's also hyperglycemic. She was given a gram of Rocephin in the ER for 7-10 white cells in her urine after Macrobid course for UTI. Hospitalist will admit patient and put orders in. U/s negative for gallstones.         Remi Haggard, NP 05/09/12 2144

## 2012-05-09 NOTE — ED Notes (Signed)
Awaiting reglan from pharmacy.

## 2012-05-09 NOTE — ED Notes (Signed)
abd pain x 1 month and her sugar has been up

## 2012-05-10 ENCOUNTER — Inpatient Hospital Stay (HOSPITAL_COMMUNITY): Payer: MEDICAID

## 2012-05-10 DIAGNOSIS — R1011 Right upper quadrant pain: Secondary | ICD-10-CM

## 2012-05-10 DIAGNOSIS — R110 Nausea: Secondary | ICD-10-CM

## 2012-05-10 LAB — CBC
HCT: 36.3 % (ref 36.0–46.0)
Hemoglobin: 12.2 g/dL (ref 12.0–15.0)
MCH: 28.8 pg (ref 26.0–34.0)
MCHC: 33.6 g/dL (ref 30.0–36.0)
MCV: 85.8 fL (ref 78.0–100.0)
Platelets: 365 10*3/uL (ref 150–400)
RBC: 4.23 MIL/uL (ref 3.87–5.11)
RDW: 13.4 % (ref 11.5–15.5)
WBC: 6.3 10*3/uL (ref 4.0–10.5)

## 2012-05-10 LAB — BASIC METABOLIC PANEL
BUN: 7 mg/dL (ref 6–23)
CO2: 29 mEq/L (ref 19–32)
Calcium: 9.1 mg/dL (ref 8.4–10.5)
Chloride: 105 mEq/L (ref 96–112)
Creatinine, Ser: 0.53 mg/dL (ref 0.50–1.10)
GFR calc Af Amer: >90 mL/min (ref >90)
GFR calc non Af Amer: >90 mL/min (ref >90)
Glucose, Bld: 248 mg/dL — ABNORMAL HIGH (ref 70–99)
Potassium: 3.6 mEq/L (ref 3.5–5.1)
Sodium: 141 mEq/L (ref 135–145)

## 2012-05-10 LAB — GLUCOSE, CAPILLARY
Glucose-Capillary: 173 mg/dL — ABNORMAL HIGH (ref 70–99)
Glucose-Capillary: 103 mg/dL — ABNORMAL HIGH (ref 70–99)
Glucose-Capillary: 64 mg/dL — ABNORMAL LOW (ref 70–99)

## 2012-05-10 LAB — TSH: TSH: 0.407 u[IU]/mL (ref 0.350–4.500)

## 2012-05-10 LAB — HIV ANTIBODY (ROUTINE TESTING W REFLEX): HIV: NONREACTIVE

## 2012-05-10 LAB — HEMOGLOBIN A1C
Hgb A1c MFr Bld: 12.3 % — ABNORMAL HIGH (ref <5.7)
Mean Plasma Glucose: 306 mg/dL — ABNORMAL HIGH (ref <117)

## 2012-05-10 LAB — LIPASE, BLOOD: Lipase: 63 U/L — ABNORMAL HIGH (ref 11–59)

## 2012-05-10 MED ORDER — DEXTROSE 50 % IV SOLN
25.0000 mL | Freq: Once | INTRAVENOUS | Status: AC | PRN
  Administered 2012-05-10: 25 mL via INTRAVENOUS

## 2012-05-10 MED ORDER — ENOXAPARIN SODIUM 40 MG/0.4ML ~~LOC~~ SOLN
40.0000 mg | SUBCUTANEOUS | Status: DC
  Administered 2012-05-10: 40 mg via SUBCUTANEOUS
  Filled 2012-05-10 (×2): qty 0.4

## 2012-05-10 MED ORDER — POTASSIUM CHLORIDE 2 MEQ/ML IV SOLN
INTRAVENOUS | Status: DC
  Administered 2012-05-10 – 2012-05-12 (×4): via INTRAVENOUS
  Filled 2012-05-10 (×5): qty 1000

## 2012-05-10 MED ORDER — INSULIN GLARGINE 100 UNIT/ML ~~LOC~~ SOLN: 20.0000 [IU] | Freq: Two times a day (BID) | SUBCUTANEOUS | Status: DC

## 2012-05-10 MED ORDER — DEXTROSE 50 % IV SOLN
INTRAVENOUS | Status: AC
  Administered 2012-05-10: 25 mL via INTRAVENOUS
  Filled 2012-05-10: qty 50

## 2012-05-10 MED ORDER — SINCALIDE 5 MCG IJ SOLR
0.0200 ug/kg | Freq: Once | INTRAMUSCULAR | Status: AC
  Administered 2012-05-10: 1.6 ug via INTRAVENOUS
  Filled 2012-05-10: qty 5

## 2012-05-10 MED ORDER — TECHNETIUM TC 99M MEBROFENIN IV KIT
5.0000 | PACK | Freq: Once | INTRAVENOUS | Status: AC | PRN
  Administered 2012-05-10: 5 via INTRAVENOUS

## 2012-05-10 NOTE — Progress Notes (Signed)
PATIENT DETAILS Name: Barbara Wang Age: 46 y.o. Sex: female Date of Birth: 02/04/1966 Admit Date: 05/09/2012 Admitting Physician Dorothea Ogle, MD ZOX:[id number],[email or web address], MD  Subjective: Having RUQ pain for almost 1 month-admitted at [facility] on 8/18 with similar problems  Assessment/Plan: Principal Problem: RUQ pain with vomiting -suspect Biliary pathology -reveiwed with Radiologist-results of CT abd and MRCP abd done at [facility] last month -will get [facility] to see and defer ordering HIDA scan to them -if HIDA neg-will need GI eval for EGD  Active Problems:  Diabetes mellitus -stable with SSI and Current dosing of Lantus-however NPO-therefore will half the home dose   HTN (hypertension) -moderate control-but fluctuant -continue to monitor off BP meds   Hypokalemia -repleted -monitor periodically  Doubt UTI -stop Rocephin -no clinical symptoms  Disposition: Remain inpatient  DVT Prophylaxis: Prophylactic Lovenox   Code Status: Full code    Procedures:  None  CONSULTS:  general surgery  PHYSICAL EXAM: Vital signs in last 24 hours: Filed Vitals:   05/09/12 1957 05/09/12 2307 05/09/12 2324 05/10/12 0534  BP: 123/63  138/76 150/81  Pulse: 73  69 68  Temp:   98.3 F (36.8 C) 98.3 F (36.8 C)  TempSrc:   Oral Oral  Resp: 18  18 18   Height:  5\' 4"  (1.626 m)    Weight:  78.699 kg (173 lb 8 oz)    SpO2: 93%  98% 95%    Weight change:  Body mass index is 29.78 kg/(m^2).   Gen Exam: Awake and alert with clear speech.   Neck: Supple, No JVD.   Chest: B/L Clear.   CVS: S1 S2 Regular, no murmurs.  Abdomen: soft, BS +,RUQ tender, non distended. No rebound or rigidity Extremities: no edema, lower extremities warm to touch. Neurologic: Non Focal.   Skin: No Rash.   Wounds: N/A.    Intake/Output from previous day:  Intake/Output Summary (Last 24 hours) at 05/10/12 1240 Last data filed at 05/10/12 0900  Gross per 24 hour  Intake      0 ml    Output      0 ml  Net      0 ml     LAB RESULTS: CBC  Lab 05/10/12 0541 05/09/12 1349  WBC 6.3 7.5  HGB 12.2 13.8  HCT 36.3 39.4  PLT 365 388  MCV 85.8 84.4  MCH 28.8 29.6  MCHC 33.6 35.0  RDW 13.4 13.2  LYMPHSABS -- 1.7  MONOABS -- 0.5  EOSABS -- 0.1  BASOSABS -- 0.0  BANDABS -- --    Chemistries   Lab 05/10/12 0541 05/09/12 2210 05/09/12 1349  NA 141 -- 138  K 3.6 -- 3.3*  CL 105 -- 101  CO2 29 -- 27  GLUCOSE 248* -- 269*  BUN 7 -- 6  CREATININE 0.53 -- 0.49*  CALCIUM 9.1 -- 9.8  MG -- 1.7 --    CBG:  Lab 05/10/12 1227 05/10/12 1133 05/10/12 0757 05/09/12 2129 05/09/12 1945  GLUCAP 103* 64* 173* 279* 334*    GFR Estimated Creatinine Clearance: 87.3 ml/min (by C-G formula based on Cr of 0.53).  Coagulation profile No results found for this basename: INR:5,PROTIME:5 in the last 168 hours  Cardiac Enzymes No results found for this basename: CK:3,CKMB:3,TROPONINI:3,MYOGLOBIN:3 in the last 168 hours  No components found with this basename: POCBNP:3 No results found for this basename: DDIMER:2 in the last 72 hours  Basename 05/09/12 2210  HGBA1C 12.3*    Basename 05/09/12 2210  CHOL 101  HDL 34*  LDLCALC 45  TRIG 409  CHOLHDL 3.0  LDLDIRECT --    Basename 05/09/12 2210  TSH 0.407  T4TOTAL --  T3FREE --  THYROIDAB --   No results found for this basename: VITAMINB12:2,FOLATE:2,FERRITIN:2,TIBC:2,IRON:2,RETICCTPCT:2 in the last 72 hours  Basename 05/10/12 0541 05/09/12 1349  LIPASE 63* 61*  AMYLASE -- --    Urine Studies No results found for this basename: UACOL:2,UAPR:2,USPG:2,UPH:2,UTP:2,UGL:2,UKET:2,UBIL:2,UHGB:2,UNIT:2,UROB:2,ULEU:2,UEPI:2,UWBC:2,URBC:2,UBAC:2,CAST:2,CRYS:2,UCOM:2,BILUA:2 in the last 72 hours  MICROBIOLOGY: No results found for this or any previous visit (from the past 240 hour(s)).  RADIOLOGY STUDIES/RESULTS: US Abdomen Complete  05/09/2012  *RADIOLOGY REPORT*  Clinical Data:  Abdominal pain, nausea and  vomiting.  COMPLETE ABDOMINAL ULTRASOUND  Comparison:  Right upper quadrant abdomen ultrasound dated 04/13/2012 and abdomen CT dated 04/13/2012.  Findings:  Gallbladder:  No gallstones, gallbladder wall thickening, or pericholecystic fluid.  Common bile duct:  Normal in caliber, measuring 4.9 mm in diameter proximally.  Liver:  No focal lesion identified.  Within normal limits in parenchymal echogenicity.  IVC:  Appears normal.  Pancreas:  Poorly visualized.  The visualized portions appear normal.  Spleen:  Normal, measuring 6.6 cm in length.  Right Kidney:  Normal, measuring 10.2 cm in length.  Left Kidney:  Normal, measuring 11.5 cm in length.  Abdominal aorta:  No aneurysm identified.  IMPRESSION: Normal examination.   Original Report Authenticated By: Darrol Angel, M.D.     MEDICATIONS: Scheduled Meds:   . aspirin EC  81 mg Oral Daily  . cefTRIAXone (ROCEPHIN)  IV  1 g Intravenous Once  . enoxaparin (LOVENOX) injection  40 mg Subcutaneous Q24H  .  HYDROmorphone (DILAUDID) injection  1 mg Intravenous Once  . insulin aspart  0-9 Units Subcutaneous TID AC  . insulin aspart  4 Units Intravenous Once  . insulin glargine  40 Units Subcutaneous BID  . metoCLOPramide (REGLAN) injection  10 mg Intravenous Once  . metoCLOPramide (REGLAN) injection  10 mg Intravenous Q8H  . ondansetron  4 mg Intravenous Once  . potassium chloride  40 mEq Oral Once  . sodium chloride  1,000 mL Intravenous Once  . DISCONTD: aspirin      . DISCONTD: cefTRIAXone (ROCEPHIN)  IV  1 g Intravenous Q24H  . DISCONTD: enoxaparin (LOVENOX) injection  30 mg Subcutaneous Q24H  . DISCONTD: insulin aspart  4-10 Units Subcutaneous TID AC   Continuous Infusions:   . sodium chloride     PRN Meds:.dextrose, HYDROcodone-acetaminophen, HYDROmorphone (DILAUDID) injection, ondansetron (ZOFRAN) IV, ondansetron  Antibiotics: Anti-infectives     Start     Dose/Rate Route Frequency Ordered Stop   05/09/12 2200   cefTRIAXone  (ROCEPHIN) 1 g in dextrose 5 % 50 mL IVPB  Status:  Discontinued        1 g 100 mL/hr over 30 Minutes Intravenous Every 24 hours 05/09/12 2156 05/10/12 0929   05/09/12 1900   cefTRIAXone (ROCEPHIN) 1 g in dextrose 5 % 50 mL IVPB        1 g 100 mL/hr over 30 Minutes Intravenous  Once 05/09/12 1850 05/09/12 1941           Jeoffrey Massed, MD  [facility] Pager:[phone]  If 7PM-7AM, please contact night-coverage [email or web address] Password [email or web address] 05/10/2012, 12:40 PM   LOS: 1 day

## 2012-05-10 NOTE — ED Provider Notes (Signed)
Medical screening examination/treatment/procedure(s) were performed by non-physician practitioner and as supervising physician I was immediately available for consultation/collaboration.   Analiah Drum, MD 05/10/12 0040 

## 2012-05-10 NOTE — Progress Notes (Signed)
Hypoglycemic Event  CBG: 64  Treatment: D50 IV 25 mL  Symptoms: Shaky  Follow-up CBG: Time:1227 CBG Result:103  Possible Reasons for Event: Other: NPO  Comments/MD notified:Ghimire    Rondell Pardon, Derryl Harbor Ray  Remember to initiate Hypoglycemia Order Set & complete

## 2012-05-10 NOTE — Consult Note (Signed)
Without stones noted on any previous study definitively, I believe that a HIDA scan would be helpful.  The patient is at risk for acalculous cholecystitis, and may represent chronic cholecystitis.  Will follow up with HIDA results.  Marta Lamas. Gae Bon, MD, FACS [phone] [facility]

## 2012-05-10 NOTE — Consult Note (Signed)
Reason for Consult: Possible cholecystitis Referring Physician: Ghimire Primary care: None previously at [facility] clinic. Barbara Wang is an 46 y.o. female.  Chief complaint: Nausea and abdominal pain right upper quadrant. HPI: Patient is a 46 year old female who presents to the ER at [facility] yesterday with abdominal pain and nausea. Patient has a history of diabetes mellitus insulin-dependent with poor control she was admitted 03/31/12- 04/05/12 with DKA and a glucose greater than 1000. She was readmitted 04/13/2012 with pancreatitis adult lipase greater than 1000. She was discharged on 04/16/2012 reported ongoing nausea and abdominal discomfort. The abdominal pain and nausea are intermittent and seem to be associated with greasy foods. Problem has  been ongoing since March of 2013. She reports a 30 pound weight loss since January. She also reports in the smell of greasy food makes her sick now.She does have some reflux and epigastric discomfort.   Workup at [facility] included: CT scan showing edema fluid posterior pancreatic head the IVC and including the left renal vein. There was lymphadenopathy and  distal densities in common bile duct a possible stones or sludge. Abdominal ultrasound showed no stones no pericholecystic fluid, disease common bile duct was 0.5 cm with no choledocholithiasis. MRCP on 04/15/12 showed bilateral effusions atelectasis, perihepatic ascites, common bile duct was normal. Intrahepatic ducts showed no filling defects. Some small renal cysts. There were no filling defects in the gallbladder no gallbladder wall thickening. No focal hepatic lesions, pancreas was normal there was some lymphadenopathy. On admission to the [facility] yesterday, abdominal ultrasound shows no gallstones, no gallbladder wall thickening or pericholecystic, fluid common bile duct is normal, no focal lesions within the liver parenchyma. LFTs are normal. Lipase is 61 and 63  respectively yesterday and today. White count is 6.3 hemoglobin 12.2 hematocrit 36.3. Urinalysis shows some white cells and bacteria. Patient continues to have abdominal pain and some nausea. We have been asked to see to evaluate for possible cholecystectomy.   Past Medical History  Diagnosis Date  . Diabetes mellitus, Insulin dependant with poor control   . Hypertension   . Pancreatitis Tobacco use  30 years up to 3PPD Depression BMI 29.7     Past Surgical History  Procedure Date  . Cesarean section     x2  . Tubal ligation   . Abdominal hysterectomy   . Rotator cuff repair     No family history on file.  Social History:  reports that she has been smoking.  She does not have any smokeless tobacco history on file. She reports that she does not drink alcohol or use illicit drugs. unemployed since February 2013. Drugs: None  alcohol: None for 20 years  tobacco: Up to 3 packs per day for 30 years. 2 adult children, currently going through a divorce.  Allergies:  Allergies  Allergen Reactions  . Penicillins Hives  . Sulfa Antibiotics Hives  . Tramadol Hives    Medications:  Prior to Admission:  Prescriptions prior to admission  Medication Sig Dispense Refill  . aspirin EC 81 MG tablet Take 81 mg by mouth daily.      . insulin aspart (NOVOLOG) 100 UNIT/ML injection Inject 4-10 Units into the skin 3 (three) times daily before meals. Sliding scale      . insulin glargine (LANTUS) 100 UNIT/ML injection Inject 40 Units into the skin 2 (two) times daily.       . nitrofurantoin, macrocrystal-monohydrate, (MACROBID) 100 MG capsule Take 100 mg by mouth 2 (two) times  daily.       Scheduled:   . aspirin EC  81 mg Oral Daily  . cefTRIAXone (ROCEPHIN)  IV  1 g Intravenous Once  . enoxaparin (LOVENOX) injection  40 mg Subcutaneous Q24H  .  HYDROmorphone (DILAUDID) injection  1 mg Intravenous Once  . insulin aspart  0-9 Units Subcutaneous TID AC  . insulin aspart  4 Units Intravenous  Once  . insulin glargine  40 Units Subcutaneous BID  . metoCLOPramide (REGLAN) injection  10 mg Intravenous Once  . metoCLOPramide (REGLAN) injection  10 mg Intravenous Q8H  . ondansetron  4 mg Intravenous Once  . potassium chloride  40 mEq Oral Once  . sodium chloride  1,000 mL Intravenous Once  . DISCONTD: aspirin      . DISCONTD: cefTRIAXone (ROCEPHIN)  IV  1 g Intravenous Q24H  . DISCONTD: enoxaparin (LOVENOX) injection  30 mg Subcutaneous Q24H  . DISCONTD: insulin aspart  4-10 Units Subcutaneous TID AC   Continuous:   . sodium chloride     ZOX:[id number], HYDROmorphone (DILAUDID) injection, ondansetron (ZOFRAN) IV, ondansetron Anti-infectives     Start     Dose/Rate Route Frequency Ordered Stop   05/09/12 2200   cefTRIAXone (ROCEPHIN) 1 g in dextrose 5 % 50 mL IVPB  Status:  Discontinued        1 g 100 mL/hr over 30 Minutes Intravenous Every 24 hours 05/09/12 2156 05/10/12 0929   05/09/12 1900   cefTRIAXone (ROCEPHIN) 1 g in dextrose 5 % 50 mL IVPB        1 g 100 mL/hr over 30 Minutes Intravenous  Once 05/09/12 1850 05/09/12 1941          Results for orders placed during the hospital encounter of 05/09/12 (from the past 48 hour(s))  CBC WITH DIFFERENTIAL     Status: Normal   Collection Time   05/09/12  1:49 PM      Component Value Range Comment   WBC 7.5  4.0 - 10.5 K/uL    RBC 4.67  3.87 - 5.11 MIL/uL    Hemoglobin 13.8  12.0 - 15.0 g/dL    HCT 08.6  57.8 - 46.9 %    MCV 84.4  78.0 - 100.0 fL    MCH 29.6  26.0 - 34.0 pg    MCHC 35.0  30.0 - 36.0 g/dL    RDW 62.9  52.8 - 41.3 %    Platelets 388  150 - 400 K/uL    Neutrophils Relative 70  43 - 77 %    Neutro Abs 5.3  1.7 - 7.7 K/uL    Lymphocytes Relative 22  12 - 46 %    Lymphs Abs 1.7  0.7 - 4.0 K/uL    Monocytes Relative 7  3 - 12 %    Monocytes Absolute 0.5  0.1 - 1.0 K/uL    Eosinophils Relative 1  0 - 5 %    Eosinophils Absolute 0.1  0.0 - 0.7 K/uL    Basophils Relative 0  0 - 1 %     Basophils Absolute 0.0  0.0 - 0.1 K/uL   COMPREHENSIVE METABOLIC PANEL     Status: Abnormal   Collection Time   05/09/12  1:49 PM      Component Value Range Comment   Sodium 138  135 - 145 mEq/L    Potassium 3.3 (*) 3.5 - 5.1 mEq/L    Chloride 101  96 - 112 mEq/L    CO2 27  19 - 32 mEq/L    Glucose, Bld 269 (*) 70 - 99 mg/dL    BUN 6  6 - 23 mg/dL    Creatinine, Ser 4.09 (*) 0.50 - 1.10 mg/dL    Calcium 9.8  8.4 - 81.1 mg/dL    Total Protein 7.3  6.0 - 8.3 g/dL    Albumin 3.5  3.5 - 5.2 g/dL    AST 16  0 - 37 U/L    ALT 13  0 - 35 U/L    Alkaline Phosphatase 79  39 - 117 U/L    Total Bilirubin 0.3  0.3 - 1.2 mg/dL    GFR calc non Af Amer >90  >90 mL/min    GFR calc Af Amer >90  >90 mL/min   LIPASE, BLOOD     Status: Abnormal   Collection Time   05/09/12  1:49 PM      Component Value Range Comment   Lipase 61 (*) 11 - 59 U/L   URINALYSIS, ROUTINE W REFLEX MICROSCOPIC     Status: Abnormal   Collection Time   05/09/12  4:59 PM      Component Value Range Comment   Color, Urine AMBER (*) YELLOW BIOCHEMICALS MAY BE AFFECTED BY COLOR   APPearance HAZY (*) CLEAR    Specific Gravity, Urine 1.020  1.005 - 1.030    pH 6.0  5.0 - 8.0    Glucose, UA >1000 (*) NEGATIVE mg/dL    Hgb urine dipstick NEGATIVE  NEGATIVE    Bilirubin Urine SMALL (*) NEGATIVE    Ketones, ur NEGATIVE  NEGATIVE mg/dL    Protein, ur NEGATIVE  NEGATIVE mg/dL    Urobilinogen, UA 1.0  0.0 - 1.0 mg/dL    Nitrite NEGATIVE  NEGATIVE    Leukocytes, UA SMALL (*) NEGATIVE   URINE MICROSCOPIC-ADD ON     Status: Abnormal   Collection Time   05/09/12  4:59 PM      Component Value Range Comment   Squamous Epithelial / LPF MANY (*) RARE    WBC, UA 7-10  <3 WBC/hpf    Bacteria, UA FEW (*) RARE    Urine-Other MUCOUS PRESENT     GLUCOSE, CAPILLARY     Status: Abnormal   Collection Time   05/09/12  7:45 PM      Component Value Range Comment   Glucose-Capillary 334 (*) 70 - 99 mg/dL    Comment 1 Notify RN      Comment 2  Documented in Chart     GLUCOSE, CAPILLARY     Status: Abnormal   Collection Time   05/09/12  9:29 PM      Component Value Range Comment   Glucose-Capillary 279 (*) 70 - 99 mg/dL   PHOSPHORUS     Status: Normal   Collection Time   05/09/12 10:10 PM      Component Value Range Comment   Phosphorus 3.2  2.3 - 4.6 mg/dL   MAGNESIUM     Status: Normal   Collection Time   05/09/12 10:10 PM      Component Value Range Comment   Magnesium 1.7  1.5 - 2.5 mg/dL   LIPID PANEL     Status: Abnormal   Collection Time   05/09/12 10:10 PM      Component Value Range Comment   Cholesterol 101  0 - 200 mg/dL    Triglycerides 914  <782 mg/dL    HDL 34 (*) >95 mg/dL    Total  CHOL/HDL Ratio 3.0      VLDL 22  0 - 40 mg/dL    LDL Cholesterol 45  0 - 99 mg/dL   ETHANOL     Status: Normal   Collection Time   05/09/12 10:10 PM      Component Value Range Comment   Alcohol, Ethyl (B) <11  0 - 11 mg/dL   BASIC METABOLIC PANEL     Status: Abnormal   Collection Time   05/10/12  5:41 AM      Component Value Range Comment   Sodium 141  135 - 145 mEq/L    Potassium 3.6  3.5 - 5.1 mEq/L    Chloride 105  96 - 112 mEq/L    CO2 29  19 - 32 mEq/L    Glucose, Bld 248 (*) 70 - 99 mg/dL    BUN 7  6 - 23 mg/dL    Creatinine, Ser 4.09  0.50 - 1.10 mg/dL    Calcium 9.1  8.4 - 81.1 mg/dL    GFR calc non Af Amer >90  >90 mL/min    GFR calc Af Amer >90  >90 mL/min   CBC     Status: Normal   Collection Time   05/10/12  5:41 AM      Component Value Range Comment   WBC 6.3  4.0 - 10.5 K/uL    RBC 4.23  3.87 - 5.11 MIL/uL    Hemoglobin 12.2  12.0 - 15.0 g/dL    HCT 91.4  78.2 - 95.6 %    MCV 85.8  78.0 - 100.0 fL    MCH 28.8  26.0 - 34.0 pg    MCHC 33.6  30.0 - 36.0 g/dL    RDW 21.3  08.6 - 57.8 %    Platelets 365  150 - 400 K/uL   LIPASE, BLOOD     Status: Abnormal   Collection Time   05/10/12  5:41 AM      Component Value Range Comment   Lipase 63 (*) 11 - 59 U/L   GLUCOSE, CAPILLARY     Status: Abnormal    Collection Time   05/10/12  7:57 AM      Component Value Range Comment   Glucose-Capillary 173 (*) 70 - 99 mg/dL     US Abdomen Complete  05/09/2012  *RADIOLOGY REPORT*  Clinical Data:  Abdominal pain, nausea and vomiting.  COMPLETE ABDOMINAL ULTRASOUND  Comparison:  Right upper quadrant abdomen ultrasound dated 04/13/2012 and abdomen CT dated 04/13/2012.  Findings:  Gallbladder:  No gallstones, gallbladder wall thickening, or pericholecystic fluid.  Common bile duct:  Normal in caliber, measuring 4.9 mm in diameter proximally.  Liver:  No focal lesion identified.  Within normal limits in parenchymal echogenicity.  IVC:  Appears normal.  Pancreas:  Poorly visualized.  The visualized portions appear normal.  Spleen:  Normal, measuring 6.6 cm in length.  Right Kidney:  Normal, measuring 10.2 cm in length.  Left Kidney:  Normal, measuring 11.5 cm in length.  Abdominal aorta:  No aneurysm identified.  IMPRESSION: Normal examination.   Original Report Authenticated By: Darrol Angel, M.D.     Review of Systems  Constitutional: Positive for weight loss (says she has lost 30 pounds since January 2013.). Negative for fever, chills, malaise/fatigue and diaphoresis.  HENT: Negative.   Eyes:       Says her vision is poor, has not seen anyone about it.  Blames it on high sugars.  Respiratory: Negative  for cough, hemoptysis, sputum production, shortness of breath and wheezing.   Cardiovascular: Positive for chest pain (with vomiting, and reflux), palpitations (says she has palpatations at night.), orthopnea, claudication (says she cant lay flat at night.) and leg swelling (calf and ankle pain walking). PND: occasional.  Gastrointestinal: Positive for heartburn, nausea, vomiting (less now), abdominal pain (pain RUQ, mid abdomen) and constipation (chronic problem). Negative for diarrhea, blood in stool and melena.  Genitourinary:       Just finished treatment for UTI, ok voiding this AM.  Musculoskeletal:  Negative.   Skin: Negative for itching.       Spots right lower leg   Neurological: Positive for weakness (since bening in hospital 2 times in August.). Negative for dizziness, tingling, tremors, sensory change, speech change, focal weakness, seizures, loss of consciousness and headaches.  Endo/Heme/Allergies: Negative.   Psychiatric/Behavioral: Positive for depression (with recent illness  and situationl issues feels very depressed.). Negative for suicidal ideas, hallucinations, memory loss and substance abuse. The patient does not have insomnia.    Blood pressure 150/81, pulse 68, temperature 98.3 F (36.8 C), temperature source Oral, resp. rate 18, height 5\' 4"  (1.626 m), weight 78.699 kg (173 lb 8 oz), SpO2 95.00%. Physical Exam  Constitutional: She is oriented to person, place, and time. She appears well-developed and well-nourished. No distress.       BMI 29.7  HENT:  Head: Normocephalic and atraumatic.  Nose: Nose normal.  Eyes: Conjunctivae normal and EOM are normal. Pupils are equal, round, and reactive to light.  Neck: Normal range of motion. Neck supple. No JVD present. No tracheal deviation present. No thyromegaly present.  Cardiovascular: Normal rate, regular rhythm, normal heart sounds and intact distal pulses.  Exam reveals no gallop.   No murmur heard. Respiratory: Effort normal and breath sounds normal. No stridor. No respiratory distress. She has no wheezes. She has no rales. She exhibits no tenderness.  GI: Soft. Bowel sounds are normal. She exhibits no distension and no mass. There is tenderness (RUQ and mid epigastric areas). There is no rebound and no guarding.  Musculoskeletal: Normal range of motion. She exhibits no edema and no tenderness.  Lymphadenopathy:    She has no cervical adenopathy.  Neurological: She is alert and oriented to person, place, and time. No cranial nerve deficit.  Skin: Skin is warm and dry. No rash noted. She is not diaphoretic. No erythema.  No pallor.  Psychiatric: She has a normal mood and affect. Her behavior is normal. Judgment and thought content normal.    Assessment/Plan: 1. Pancreatitis with recurrent nausea and abdominal pain. 2. MRCP 04/15/12 which shows no stones and no gallbladder filling defects or thickening. 3. Insulin-dependent diabetes with poor control. 4. Hypertension 5. Tobacco use 6. BMI 29.7  Plan: I discussed with Dr. Lindie Spruce for a HIDA scan and follow with you. Will [facility] physician assistant for Dr. Frederik Schmidt.  Zaiya Annunziato 05/10/2012, 9:35 AM

## 2012-05-10 NOTE — Progress Notes (Signed)
patientt request a meal status after HIDDA scan. Patient stated she would eat something regardless if we got an order from the doctor or not. She would get someone to bring her some food. Got an order for clear liquid diet.

## 2012-05-11 ENCOUNTER — Encounter (HOSPITAL_COMMUNITY)
Admission: EM | Disposition: A | Discharge status: Home / Self Care | Payer: Self-pay | Source: Home / Self Care | Attending: Internal Medicine

## 2012-05-11 ENCOUNTER — Encounter (HOSPITAL_COMMUNITY): Payer: Self-pay | Admitting: *Deleted

## 2012-05-11 ENCOUNTER — Inpatient Hospital Stay (HOSPITAL_COMMUNITY): Payer: MEDICAID | Admitting: *Deleted

## 2012-05-11 ENCOUNTER — Inpatient Hospital Stay (HOSPITAL_COMMUNITY): Payer: MEDICAID

## 2012-05-11 DIAGNOSIS — K811 Chronic cholecystitis: Secondary | ICD-10-CM

## 2012-05-11 HISTORY — PX: CHOLECYSTECTOMY: SHX55

## 2012-05-11 LAB — GLUCOSE, CAPILLARY
Glucose-Capillary: 170 mg/dL — ABNORMAL HIGH (ref 70–99)
Glucose-Capillary: 204 mg/dL — ABNORMAL HIGH (ref 70–99)
Glucose-Capillary: 233 mg/dL — ABNORMAL HIGH (ref 70–99)

## 2012-05-11 LAB — COMPREHENSIVE METABOLIC PANEL
ALT: 14 U/L (ref 0–35)
AST: 20 U/L (ref 0–37)
Albumin: 3 g/dL — ABNORMAL LOW (ref 3.5–5.2)
Alkaline Phosphatase: 63 U/L (ref 39–117)
BUN: 4 mg/dL — ABNORMAL LOW (ref 6–23)
CO2: 25 mEq/L (ref 19–32)
Calcium: 9.2 mg/dL (ref 8.4–10.5)
Chloride: 106 mEq/L (ref 96–112)
Creatinine, Ser: 0.46 mg/dL — ABNORMAL LOW (ref 0.50–1.10)
GFR calc Af Amer: >90 mL/min (ref >90)
GFR calc non Af Amer: >90 mL/min (ref >90)
Glucose, Bld: 167 mg/dL — ABNORMAL HIGH (ref 70–99)
Potassium: 3.5 mEq/L (ref 3.5–5.1)
Sodium: 140 mEq/L (ref 135–145)
Total Bilirubin: 0.3 mg/dL (ref 0.3–1.2)
Total Protein: 6.5 g/dL (ref 6.0–8.3)

## 2012-05-11 LAB — CBC
HCT: 36.4 % (ref 36.0–46.0)
Hemoglobin: 12.1 g/dL (ref 12.0–15.0)
MCH: 28.5 pg (ref 26.0–34.0)
MCHC: 33.2 g/dL (ref 30.0–36.0)
MCV: 85.8 fL (ref 78.0–100.0)
Platelets: 349 10*3/uL (ref 150–400)
RBC: 4.24 MIL/uL (ref 3.87–5.11)
RDW: 13.1 % (ref 11.5–15.5)
WBC: 7 10*3/uL (ref 4.0–10.5)

## 2012-05-11 LAB — SURGICAL PCR SCREEN
MRSA, PCR: NEGATIVE
Staphylococcus aureus: NEGATIVE

## 2012-05-11 LAB — LIPASE, BLOOD: Lipase: 52 U/L (ref 11–59)

## 2012-05-11 SURGERY — LAPAROSCOPIC CHOLECYSTECTOMY WITH INTRAOPERATIVE CHOLANGIOGRAM
Anesthesia: General | Site: Abdomen | Wound class: Contaminated

## 2012-05-11 SURGERY — LAPAROSCOPIC CHOLECYSTECTOMY SINGLE SITE
Anesthesia: General

## 2012-05-11 MED ORDER — HYDRALAZINE HCL 20 MG/ML IJ SOLN
5.0000 mg | Freq: Four times a day (QID) | INTRAMUSCULAR | Status: DC | PRN
  Administered 2012-05-11: 5 mg via INTRAVENOUS
  Filled 2012-05-11 (×3): qty 0.25

## 2012-05-11 MED ORDER — ACETAMINOPHEN 10 MG/ML IV SOLN
INTRAVENOUS | Status: DC | PRN
  Administered 2012-05-11: 1000 mg via INTRAVENOUS

## 2012-05-11 MED ORDER — CIPROFLOXACIN IN D5W 400 MG/200ML IV SOLN
INTRAVENOUS | Status: DC | PRN
  Administered 2012-05-11: 400 mg via INTRAVENOUS

## 2012-05-11 MED ORDER — CIPROFLOXACIN IN D5W 400 MG/200ML IV SOLN
INTRAVENOUS | Status: AC
  Filled 2012-05-11: qty 200

## 2012-05-11 MED ORDER — NEOSTIGMINE METHYLSULFATE 1 MG/ML IJ SOLN
INTRAMUSCULAR | Status: DC | PRN
  Administered 2012-05-11: 5 mg via INTRAVENOUS

## 2012-05-11 MED ORDER — BUPIVACAINE-EPINEPHRINE PF 0.25-1:200000 % IJ SOLN
INTRAMUSCULAR | Status: AC
  Filled 2012-05-11: qty 30

## 2012-05-11 MED ORDER — HYDROMORPHONE HCL PF 1 MG/ML IJ SOLN: 0.2500 mg | INTRAMUSCULAR | Status: DC | PRN

## 2012-05-11 MED ORDER — MIDAZOLAM HCL 5 MG/5ML IJ SOLN
INTRAMUSCULAR | Status: DC | PRN
  Administered 2012-05-11: 2 mg via INTRAVENOUS

## 2012-05-11 MED ORDER — HYDROMORPHONE HCL PF 1 MG/ML IJ SOLN: 0.5000 mg | INTRAMUSCULAR | Status: DC | PRN

## 2012-05-11 MED ORDER — CIPROFLOXACIN IN D5W 400 MG/200ML IV SOLN: 400.0000 mg | INTRAVENOUS | Status: DC

## 2012-05-11 MED ORDER — METOPROLOL TARTRATE 1 MG/ML IV SOLN
2.5000 mg | Freq: Three times a day (TID) | INTRAVENOUS | Status: DC
  Administered 2012-05-11 – 2012-05-12 (×3): 2.5 mg via INTRAVENOUS
  Filled 2012-05-11 (×7): qty 5

## 2012-05-11 MED ORDER — ESMOLOL HCL 10 MG/ML IV SOLN
INTRAVENOUS | Status: DC | PRN
  Administered 2012-05-11: 30 mg via INTRAVENOUS
  Administered 2012-05-11: 10 mg via INTRAVENOUS

## 2012-05-11 MED ORDER — GLYCOPYRROLATE 0.2 MG/ML IJ SOLN
INTRAMUSCULAR | Status: DC | PRN
  Administered 2012-05-11: .6 mg via INTRAVENOUS

## 2012-05-11 MED ORDER — VECURONIUM BROMIDE 10 MG IV SOLR
INTRAVENOUS | Status: DC | PRN
  Administered 2012-05-11: 1 mg via INTRAVENOUS

## 2012-05-11 MED ORDER — OXYCODONE HCL 5 MG/5ML PO SOLN: 5.0000 mg | Freq: Once | ORAL | Status: AC | PRN

## 2012-05-11 MED ORDER — FENTANYL CITRATE 0.05 MG/ML IJ SOLN
INTRAMUSCULAR | Status: DC | PRN
  Administered 2012-05-11 (×3): 100 ug via INTRAVENOUS
  Administered 2012-05-11: 50 ug via INTRAVENOUS

## 2012-05-11 MED ORDER — LACTATED RINGERS IV SOLN
INTRAVENOUS | Status: DC | PRN
  Administered 2012-05-11 (×2): via INTRAVENOUS

## 2012-05-11 MED ORDER — BUPIVACAINE-EPINEPHRINE 0.25% -1:200000 IJ SOLN
INTRAMUSCULAR | Status: DC | PRN
  Administered 2012-05-11: 19 mL

## 2012-05-11 MED ORDER — ONDANSETRON HCL 4 MG/2ML IJ SOLN
INTRAMUSCULAR | Status: DC | PRN
  Administered 2012-05-11: 4 mg via INTRAVENOUS

## 2012-05-11 MED ORDER — ACETAMINOPHEN 10 MG/ML IV SOLN
INTRAVENOUS | Status: AC
  Filled 2012-05-11: qty 100

## 2012-05-11 MED ORDER — SODIUM CHLORIDE 0.9 % IR SOLN
Status: DC | PRN
  Administered 2012-05-11: 1

## 2012-05-11 MED ORDER — OXYCODONE HCL 5 MG PO TABS: 5.0000 mg | ORAL_TABLET | Freq: Once | ORAL | Status: AC | PRN

## 2012-05-11 MED ORDER — SODIUM CHLORIDE 0.9 % IV SOLN
INTRAVENOUS | Status: DC | PRN
  Administered 2012-05-11: 11:00:00

## 2012-05-11 MED ORDER — ENOXAPARIN SODIUM 40 MG/0.4ML ~~LOC~~ SOLN
40.0000 mg | SUBCUTANEOUS | Status: DC
  Administered 2012-05-12: 40 mg via SUBCUTANEOUS
  Filled 2012-05-11 (×2): qty 0.4

## 2012-05-11 MED ORDER — DROPERIDOL 2.5 MG/ML IJ SOLN
INTRAMUSCULAR | Status: DC | PRN
  Administered 2012-05-11: 0.625 mg via INTRAVENOUS

## 2012-05-11 SURGICAL SUPPLY — 45 items
APL SKNCLS STERI-STRIP NONHPOA (GAUZE/BANDAGES/DRESSINGS) ×1
APPLIER CLIP 5 13 M/L LIGAMAX5 (MISCELLANEOUS) ×2
APR CLP MED LRG 5 ANG JAW (MISCELLANEOUS) ×1
BAG SPEC RTRVL LRG 6X4 10 (ENDOMECHANICALS) ×1
BANDAGE ADHESIVE 1X3 (GAUZE/BANDAGES/DRESSINGS) ×4 IMPLANT
BENZOIN TINCTURE PRP APPL 2/3 (GAUZE/BANDAGES/DRESSINGS) ×2 IMPLANT
BLADE SURG ROTATE 9660 (MISCELLANEOUS) ×1 IMPLANT
CANISTER SUCTION 2500CC (MISCELLANEOUS) ×2 IMPLANT
CHLORAPREP W/TINT 26ML (MISCELLANEOUS) ×2 IMPLANT
CLIP APPLIE 5 13 M/L LIGAMAX5 (MISCELLANEOUS) ×1 IMPLANT
CLOTH BEACON ORANGE TIMEOUT ST (SAFETY) ×2 IMPLANT
CLSR STERI-STRIP ANTIMIC 1/2X4 (GAUZE/BANDAGES/DRESSINGS) ×1 IMPLANT
COVER MAYO STAND STRL (DRAPES) ×2 IMPLANT
COVER SURGICAL LIGHT HANDLE (MISCELLANEOUS) ×2 IMPLANT
DECANTER SPIKE VIAL GLASS SM (MISCELLANEOUS) ×1 IMPLANT
DEVICE TROCAR PUNCTURE CLOSURE (ENDOMECHANICALS) ×1 IMPLANT
DRAPE C-ARM 42X72 X-RAY (DRAPES) ×2 IMPLANT
DRAPE UTILITY 15X26 W/TAPE STR (DRAPE) ×4 IMPLANT
DRSG TEGADERM 4X4.75 (GAUZE/BANDAGES/DRESSINGS) ×2 IMPLANT
ELECT REM PT RETURN 9FT ADLT (ELECTROSURGICAL) ×2
ELECTRODE REM PT RTRN 9FT ADLT (ELECTROSURGICAL) ×1 IMPLANT
GAUZE SPONGE 2X2 8PLY STRL LF (GAUZE/BANDAGES/DRESSINGS) ×1 IMPLANT
GLOVE BIOGEL M STRL SZ7.5 (GLOVE) ×2 IMPLANT
GLOVE BIOGEL PI IND STRL 8 (GLOVE) ×1 IMPLANT
GLOVE BIOGEL PI INDICATOR 8 (GLOVE) ×1
GOWN STRL NON-REIN LRG LVL3 (GOWN DISPOSABLE) ×4 IMPLANT
GOWN STRL REIN XL XLG (GOWN DISPOSABLE) ×3 IMPLANT
KIT BASIN OR (CUSTOM PROCEDURE TRAY) ×2 IMPLANT
KIT ROOM TURNOVER OR (KITS) ×2 IMPLANT
NS IRRIG 1000ML POUR BTL (IV SOLUTION) ×2 IMPLANT
PAD ARMBOARD 7.5X6 YLW CONV (MISCELLANEOUS) ×2 IMPLANT
POUCH SPECIMEN RETRIEVAL 10MM (ENDOMECHANICALS) ×2 IMPLANT
SCISSORS LAP 5X35 DISP (ENDOMECHANICALS) ×1 IMPLANT
SET CHOLANGIOGRAPH 5 50 .035 (SET/KITS/TRAYS/PACK) ×2 IMPLANT
SET IRRIG TUBING LAPAROSCOPIC (IRRIGATION / IRRIGATOR) ×2 IMPLANT
SLEEVE ENDOPATH XCEL 5M (ENDOMECHANICALS) ×4 IMPLANT
SPECIMEN JAR SMALL (MISCELLANEOUS) ×2 IMPLANT
SPONGE GAUZE 2X2 STER 10/PKG (GAUZE/BANDAGES/DRESSINGS) ×1
SUT MNCRL AB 4-0 PS2 18 (SUTURE) ×2 IMPLANT
TOWEL OR 17X24 6PK STRL BLUE (TOWEL DISPOSABLE) ×2 IMPLANT
TOWEL OR 17X26 10 PK STRL BLUE (TOWEL DISPOSABLE) ×2 IMPLANT
TRAY LAPAROSCOPIC (CUSTOM PROCEDURE TRAY) ×2 IMPLANT
TROCAR XCEL 12X100 BLDLESS (ENDOMECHANICALS) ×1 IMPLANT
TROCAR XCEL BLUNT TIP 100MML (ENDOMECHANICALS) ×2 IMPLANT
TROCAR XCEL NON-BLD 5MMX100MML (ENDOMECHANICALS) ×2 IMPLANT

## 2012-05-11 NOTE — Progress Notes (Signed)
Subjective: Pt seen and examined. Has had RUQ pain radiating to back for about a month. Extensive workup at OSH. Associated with N/V. HIDA showed low GB ef  Objective: Vital signs in last 24 hours: Temp:  [98.4 F (36.9 C)-98.6 F (37 C)] 98.6 F (37 C) (09/14 0529) Pulse Rate:  [72-74] 74  (09/14 0529) Resp:  [18-20] 20  (09/14 0529) BP: (163-170)/(76-82) 170/76 mmHg (09/14 0529) SpO2:  [93 %-100 %] 100 % (09/14 0529)    Intake/Output from previous day: 09/13 0701 - 09/14 0700 In: 1345 [I.V.:1345] Out: -  Intake/Output this shift:    Alert, nad, approp cta Reg Soft, mild TTP in RUQ, lower midline incision  Lab Results:   Basename 05/11/12 0645 05/10/12 0541  WBC 7.0 6.3  HGB 12.1 12.2  HCT 36.4 36.3  PLT 349 365   BMET  Basename 05/11/12 0645 05/10/12 0541  NA 140 141  K 3.5 3.6  CL 106 105  CO2 25 29  GLUCOSE 167* 248*  BUN 4* 7  CREATININE 0.46* 0.53  CALCIUM 9.2 9.1   PT/INR No results found for this basename: LABPROT:2,INR:2 in the last 72 hours ABG No results found for this basename: PHART:2,PCO2:2,PO2:2,HCO3:2 in the last 72 hours  Studies/Results: US Abdomen Complete  05/09/2012  *RADIOLOGY REPORT*  Clinical Data:  Abdominal pain, nausea and vomiting.  COMPLETE ABDOMINAL ULTRASOUND  Comparison:  Right upper quadrant abdomen ultrasound dated 04/13/2012 and abdomen CT dated 04/13/2012.  Findings:  Gallbladder:  No gallstones, gallbladder wall thickening, or pericholecystic fluid.  Common bile duct:  Normal in caliber, measuring 4.9 mm in diameter proximally.  Liver:  No focal lesion identified.  Within normal limits in parenchymal echogenicity.  IVC:  Appears normal.  Pancreas:  Poorly visualized.  The visualized portions appear normal.  Spleen:  Normal, measuring 6.6 cm in length.  Right Kidney:  Normal, measuring 10.2 cm in length.  Left Kidney:  Normal, measuring 11.5 cm in length.  Abdominal aorta:  No aneurysm identified.  IMPRESSION: Normal  examination.   Original Report Authenticated By: Darrol Angel, M.D.    Nm Hepato W/eject Fract  05/10/2012  *RADIOLOGY REPORT*  Clinical Data:  Right upper quadrant abdominal pain.  NUCLEAR MEDICINE HEPATOBILIARY IMAGING WITH GALLBLADDER EF  Technique: Sequential images of the abdomen were obtained out to 60 minutes following intravenous administration of radiopharmaceutical.  After the slow intravenous infusion of 1.6 uCg Cholecystokinin, the gallbladder ejection fraction was determined.  Radiopharmaceutical:  5.0 mCi Tc-80m Choletec  Comparison: Abdominal ultrasound 05/09/2012.  Findings:  Initial images demonstrate homogenous hepatic activity and prompt visualization of the gallbladder and biliary system.  The stimulated portion of the study demonstrates limited gallbladder contraction and progressive small bowel activity.  The gallbladder ejection fraction calculated at approximately 30 minutes is 23.2%.  Normal ejection fractions are considered greater than 30%.  The patient did experience symptoms (abdominal pain) during CCK administration.  IMPRESSION:  1.  The cystic and common bile ducts are patent. 2.  Mildly decreased gallbladder ejection fraction of 23%.  This may indicate the presence of biliary dyskinesia.   Original Report Authenticated By: Gerrianne Scale, M.D.     Anti-infectives: Anti-infectives     Start     Dose/Rate Route Frequency Ordered Stop   05/09/12 2200   cefTRIAXone (ROCEPHIN) 1 g in dextrose 5 % 50 mL IVPB  Status:  Discontinued        1 g 100 mL/hr over 30 Minutes Intravenous Every 24 hours  05/09/12 2156 05/10/12 0929   05/09/12 1900   cefTRIAXone (ROCEPHIN) 1 g in dextrose 5 % 50 mL IVPB        1 g 100 mL/hr over 30 Minutes Intravenous  Once 05/09/12 1850 05/09/12 1941          Assessment/Plan: s/p * No surgery found * Mild pancreatitis Biliary dyskinesia Uncontrolled DM HTN  I believe some of the patient's symptoms are consistent with gallbladder  disease.  We discussed gallbladder disease. We discussed non-operative and operative management.  I discussed laparoscopic cholecystectomy with  in detail.  The patient was shown diagrams detailing the procedure.  We discussed the risks and benefits of a laparoscopic cholecystectomy including, but not limited to bleeding, infection, injury to surrounding structures such as the intestine or liver, bile leak, retained gallstones, need to convert to an open procedure, prolonged diarrhea, blood clots such as  DVT, common bile duct injury, anesthesia risks, and possible need for additional procedures.  We discussed the typical post-operative recovery course. I explained that the likelihood of improvement of their symptoms is fair to good.  She has elected to proceed with surgery  Mary Sella. Andrey Campanile, MD, FACS General, Bariatric, & Minimally Invasive Surgery [facility], [place]    LOS: 2 days    Barbara Wang 05/11/2012

## 2012-05-11 NOTE — Progress Notes (Signed)
PATIENT DETAILS Name: Barbara Wang Age: 46 y.o. Sex: female Date of Birth: 06/04/1966 Admit Date: 05/09/2012 Admitting Physician Dorothea Ogle, MD NGE:[id number], MD  Subjective: Having RUQ pain for almost 1 month-admitted at [facility] on 8/18 with similar problems  Assessment/Plan: Principal Problem: RUQ pain with vomiting -suspect Biliary pathology-HIDA shows patent cystic duct -but low Gall bladder EF-suspicious for biliary dyskinesia -reveiwed with Radiologist-results of CT abd and MRCP abd done at [place] last month -agree with cholecystectomy-hopefully her symptoms are from GB disease-if not will need GI w/u after cholecystectomy-patient aware of this and is agreeable  Active Problems:  Diabetes mellitus -hypoglycemic episode yesterday-SSI-resume lantus when diet resumed   HTN (hypertension) -apparently takes Metoprolol as outpatient-previously not on her outpatient meds-BP up today-therefore will start low dose IV lopressor   Hypokalemia -repleted -monitor periodically  Doubt UTI -stop Rocephin -no clinical symptoms  Disposition: Remain inpatient  DVT Prophylaxis: Prophylactic Lovenox   Code Status: Full code    Procedures:  Lap Cholecystectomy-9/14  CONSULTS:  general surgery  PHYSICAL EXAM: Vital signs in last 24 hours: Filed Vitals:   05/09/12 2324 05/10/12 0534 05/10/12 2116 05/11/12 0529  BP: 138/76 150/81 163/82 170/76  Pulse: 69 68 72 74  Temp: 98.3 F (36.8 C) 98.3 F (36.8 C) 98.4 F (36.9 C) 98.6 F (37 C)  TempSrc: Oral Oral Oral Oral  Resp: 18 18 18 20   Height:      Weight:      SpO2: 98% 95% 93% 100%    Weight change:  Body mass index is 29.78 kg/(m^2).   Gen Exam: Awake and alert with clear speech.   Neck: Supple, No JVD.   Chest: B/L Clear.   CVS: S1 S2 Regular, no murmurs.  Abdomen: soft, BS +,RUQ tender, non distended. No rebound or rigidity Extremities: no edema, lower extremities warm to  touch. Neurologic: Non Focal.   Skin: No Rash.   Wounds: N/A.    Intake/Output from previous day:  Intake/Output Summary (Last 24 hours) at 05/11/12 1135 Last data filed at 05/11/12 1115  Gross per 24 hour  Intake   2745 ml  Output     25 ml  Net   2720 ml     LAB RESULTS: CBC  Lab 05/11/12 0645 05/10/12 0541 05/09/12 1349  WBC 7.0 6.3 7.5  HGB 12.1 12.2 13.8  HCT 36.4 36.3 39.4  PLT 349 365 388  MCV 85.8 85.8 84.4  MCH 28.5 28.8 29.6  MCHC 33.2 33.6 35.0  RDW 13.1 13.4 13.2  LYMPHSABS -- -- 1.7  MONOABS -- -- 0.5  EOSABS -- -- 0.1  BASOSABS -- -- 0.0  BANDABS -- -- --    Chemistries   Lab 05/11/12 0645 05/10/12 0541 05/09/12 2210 05/09/12 1349  NA 140 141 -- 138  K 3.5 3.6 -- 3.3*  CL 106 105 -- 101  CO2 25 29 -- 27  GLUCOSE 167* 248* -- 269*  BUN 4* 7 -- 6  CREATININE 0.46* 0.53 -- 0.49*  CALCIUM 9.2 9.1 -- 9.8  MG -- -- 1.7 --    CBG:  Lab 05/11/12 0753 05/10/12 1227 05/10/12 1133 05/10/12 0757 05/09/12 2129  GLUCAP 170* 103* 64* 173* 279*    GFR Estimated Creatinine Clearance: 87.3 ml/min (by C-G formula based on Cr of 0.46).  Coagulation profile No results found for this basename: INR:5,PROTIME:5 in the last 168 hours  Cardiac Enzymes No results found for this basename: CK:3,CKMB:3,TROPONINI:3,MYOGLOBIN:3 in the last 168 hours  No components found with this basename: POCBNP:3 No results found for this basename: DDIMER:2 in the last 72 hours  Basename 05/09/12 2210  HGBA1C 12.3*    Basename 05/09/12 2210  CHOL 101  HDL 34*  LDLCALC 45  TRIG 409  CHOLHDL 3.0  LDLDIRECT --    Basename 05/09/12 2210  TSH 0.407  T4TOTAL --  T3FREE --  THYROIDAB --   No results found for this basename: VITAMINB12:2,FOLATE:2,FERRITIN:2,TIBC:2,IRON:2,RETICCTPCT:2 in the last 72 hours  Basename 05/11/12 0645 05/10/12 0541  LIPASE 52 63*  AMYLASE -- --    Urine Studies No results found for this basename:  UACOL:2,UAPR:2,USPG:2,UPH:2,UTP:2,UGL:2,UKET:2,UBIL:2,UHGB:2,UNIT:2,UROB:2,ULEU:2,UEPI:2,UWBC:2,URBC:2,UBAC:2,CAST:2,CRYS:2,UCOM:2,BILUA:2 in the last 72 hours  MICROBIOLOGY: No results found for this or any previous visit (from the past 240 hour(s)).  RADIOLOGY STUDIES/RESULTS: US Abdomen Complete  05/09/2012  *RADIOLOGY REPORT*  Clinical Data:  Abdominal pain, nausea and vomiting.  COMPLETE ABDOMINAL ULTRASOUND  Comparison:  Right upper quadrant abdomen ultrasound dated 04/13/2012 and abdomen CT dated 04/13/2012.  Findings:  Gallbladder:  No gallstones, gallbladder wall thickening, or pericholecystic fluid.  Common bile duct:  Normal in caliber, measuring 4.9 mm in diameter proximally.  Liver:  No focal lesion identified.  Within normal limits in parenchymal echogenicity.  IVC:  Appears normal.  Pancreas:  Poorly visualized.  The visualized portions appear normal.  Spleen:  Normal, measuring 6.6 cm in length.  Right Kidney:  Normal, measuring 10.2 cm in length.  Left Kidney:  Normal, measuring 11.5 cm in length.  Abdominal aorta:  No aneurysm identified.  IMPRESSION: Normal examination.   Original Report Authenticated By: Darrol Angel, M.D.     MEDICATIONS: Scheduled Meds:    . aspirin EC  81 mg Oral Daily  . enoxaparin (LOVENOX) injection  40 mg Subcutaneous Q24H  . insulin aspart  0-9 Units Subcutaneous TID AC  . metoprolol  2.5 mg Intravenous Q8H  . sincalide  0.02 mcg/kg Intravenous Once  . DISCONTD: insulin glargine  20 Units Subcutaneous BID  . DISCONTD: insulin glargine  40 Units Subcutaneous BID  . DISCONTD: metoCLOPramide (REGLAN) injection  10 mg Intravenous Q8H   Continuous Infusions:    . dextrose 5 % and 0.9% NaCl 1,000 mL with potassium chloride 20 mEq infusion 75 mL/hr at 05/11/12 0700  . DISCONTD: sodium chloride     PRN Meds:.bupivacaine-EPINEPHrine, dextrose, hydrALAZINE, HYDROcodone-acetaminophen, HYDROmorphone (DILAUDID) injection, Omnipaque 300 mg/mL (50 mL) in  0.9% normal saline (50 mL), ondansetron (ZOFRAN) IV, ondansetron, sodium chloride irrigation, technetium TC 49M mebrofenin  Antibiotics: Anti-infectives     Start     Dose/Rate Route Frequency Ordered Stop   05/09/12 2200   cefTRIAXone (ROCEPHIN) 1 g in dextrose 5 % 50 mL IVPB  Status:  Discontinued        1 g 100 mL/hr over 30 Minutes Intravenous Every 24 hours 05/09/12 2156 05/10/12 0929   05/09/12 1900   cefTRIAXone (ROCEPHIN) 1 g in dextrose 5 % 50 mL IVPB        1 g 100 mL/hr over 30 Minutes Intravenous  Once 05/09/12 1850 05/09/12 1941           Jeoffrey Massed, MD  [facility] Pager:[phone]  If 7PM-7AM, please contact night-coverage [email or web address] Password [email or web address] 05/11/2012, 11:35 AM   LOS: 2 days

## 2012-05-11 NOTE — Progress Notes (Signed)
Barbara Wang [id number] Transfer Data: 05/11/2012 12:58 PM Attending Provider: Maretta Bees, MD WUJ:[id number], MD Code Status: full   Barbara Wang is a 46 y.o. female patient transferred from PACU No acute distress noted.  No c/o shortness of breath, no c/o chest pain.   Blood pressure 177/71, pulse 67, temperature 97.7 F (36.5 C), temperature source Oral, resp. rate 20, height 5\' 4"  (1.626 m), weight 78.699 kg (173 lb 8 oz), SpO2 98.00%.   IV Fluids:  IV in place, occlusive dsg intact without redness, IV cath wrist left, condition patent and no redness normal saline.   Allergies:  Penicillins; Sulfa antibiotics; and Tramadol  Past Medical History:   has a past medical history of Diabetes mellitus; Hypertension; and Pancreatitis.  Past Surgical History:   has past surgical history that includes Cesarean section; Tubal ligation; Abdominal hysterectomy; and Rotator cuff repair.  Social History:   reports that she has been smoking.  She does not have any smokeless tobacco history on file. She reports that she does not drink alcohol or use illicit drugs.  Skin: surgical incisions  Orientation to room, and floor completed with information packet given to patient/family.   SR up x 2, fall assessment complete, with patient and family able to verbalize understanding of risk associated with falls, and verbalized understanding to call for assistance before getting out of bed.   Call light within reach. Patient able to voice and demonstrate understanding of unit orientation instructions.   Will cont to eval and treat per MD orders.  Susann Givens, RN 05/11/2012 12:58 PM

## 2012-05-11 NOTE — Anesthesia Preprocedure Evaluation (Addendum)
Anesthesia Evaluation  Patient identified by MRN, date of birth, ID band Patient awake    Reviewed: Allergy & Precautions, H&P , NPO status , Patient's Chart, lab work & pertinent test results  Airway Mallampati: II TM Distance: >3 FB Neck ROM: Full    Dental No notable dental hx. (+) Teeth Intact and Dental Advisory Given   Pulmonary neg pulmonary ROS,    Pulmonary exam normal       Cardiovascular hypertension, Pt. on medications     Neuro/Psych negative neurological ROS  negative psych ROS   GI/Hepatic negative GI ROS, Neg liver ROS,   Endo/Other  diabetes, Well Controlled, Type 2, Insulin Dependent  Renal/GU negative Renal ROS  negative genitourinary   Musculoskeletal   Abdominal   Peds  Hematology negative hematology ROS (+)   Anesthesia Other Findings   Reproductive/Obstetrics negative OB ROS                          Anesthesia Physical Anesthesia Plan  ASA: III  Anesthesia Plan: General   Post-op Pain Management:    Induction: Intravenous  Airway Management Planned: Oral ETT  Additional Equipment:   Intra-op Plan:   Post-operative Plan: Extubation in OR  Informed Consent: I have reviewed the patients History and Physical, chart, labs and discussed the procedure including the risks, benefits and alternatives for the proposed anesthesia with the patient or authorized representative who has indicated his/her understanding and acceptance.   Dental advisory given  Plan Discussed with: Surgeon and CRNA  Anesthesia Plan Comments:         Anesthesia Quick Evaluation

## 2012-05-11 NOTE — Preoperative (Signed)
Beta Blockers   Reason not to administer Beta Blockers:Not Applicable 

## 2012-05-11 NOTE — Op Note (Signed)
Laparoscopic Cholecystectomy with IOC Procedure Note  Indications: This patient presents with symptomatic gallbladder disease (biliary dyskinesia) and will undergo laparoscopic cholecystectomy.  Pre-operative Diagnosis: biliary dyskinesia  Post-operative Diagnosis: Same  Surgeon: Atilano Ina   Assistants: none  Anesthesia: General endotracheal anesthesia  ASA Class: 3  Procedure Details  The patient was seen again in the Holding Room. The risks, benefits, complications, treatment options, and expected outcomes were discussed with the patient. The possibilities of reaction to medication, pulmonary aspiration, perforation of viscus, bleeding, recurrent infection, finding a normal gallbladder, the need for additional procedures, failure to diagnose a condition, the possible need to convert to an open procedure, and creating a complication requiring transfusion or operation were discussed with the patient. The likelihood of improving the patient's symptoms with return to their baseline status is good.  The patient and/or family concurred with the proposed plan, giving informed consent. The site of surgery properly noted. The patient was taken to Operating Room, identified as Barbara Wang and the procedure verified as Laparoscopic Cholecystectomy with Intraoperative Cholangiogram. A Time Out was held and the above information confirmed.  Prior to the induction of general anesthesia, antibiotic prophylaxis was administered. General endotracheal anesthesia was then administered and tolerated well. After the induction, the abdomen was prepped with Chloraprep and draped in the sterile fashion. The patient was positioned in the supine position.  Because of her previous lower midline incision, I decided to gain entry into the abdominal cavity using a Optiview technique. A small incision was made in the LUQ and I then advanced a 0 degree 5mm laparoscope thru a 5mm trocar thru all layers of the  abdominal wall under direct visualization and smoothly entered the abdominal cavity. Pneumoperitoneum was then created with CO2 and tolerated well without any adverse changes in the patient's vital signs.  Local anesthetic agent was injected into the skin near the umbilicus and an incision made. A 12 mm trocar was placed under direct visualization.   Two 5-mm ports were placed in the right upper quadrant. All skin incisions were infiltrated with a local anesthetic agent before making the incision and placing the trocars.   We positioned the patient in reverse Trendelenburg, tilted slightly to the patient's left.  The gallbladder was identified, the fundus grasped and retracted cephalad. Adhesions were lysed bluntly and with the electrocautery where indicated, taking care not to injure any adjacent organs or viscus. The infundibulum was grasped and retracted laterally, exposing the peritoneum overlying the triangle of Calot. This was then divided and exposed in a blunt fashion. A critical view of the cystic duct and cystic artery was obtained.  The cystic duct was clearly identified and bluntly dissected circumferentially. The cystic duct was ligated with a clip distally.   An incision was made in the cystic duct and the [facility] cholangiogram catheter introduced. The catheter was secured using a clip. A cholangiogram was then obtained which showed good visualization of the distal and proximal biliary tree with no sign of filling defects or obstruction.  Contrast flowed easily into the duodenum. The catheter was then removed.   The cystic duct was then ligated with clips and divided. The cystic artery was identified, dissected free, ligated with clips and divided as well.   The gallbladder was dissected from the liver bed in retrograde fashion with the electrocautery. An additional vessel was encountered up in the GB fossa which was clipped. The gallbladder was removed and placed in an Endocatch sac.  The  gallbladder and Endocatch  sac were then removed through the umbilical port site. The liver bed was irrigated and inspected. Hemostasis was achieved with the electrocautery. Copious irrigation was utilized and was repeatedly aspirated until clear.      We again inspected the right upper quadrant for hemostasis.  I placed 2 interrupted 0-vicryl sutures to close the umbilical fascia using an endoclose. The umbilical closure was inspected and there was no air leak and nothing trapped within the closure. Pneumoperitoneum was released as we removed the trocars.  4-0 Monocryl was used to close the skin.   Benzoin, steri-strips, and clean dressings were applied. The patient was then extubated and brought to the recovery room in stable condition. Instrument, sponge, and needle counts were correct at closure and at the conclusion of the case.   Findings: +critical view. Some light omental adhesions in LUQ  Estimated Blood Loss: Minimal         Drains: none         Specimens: Gallbladder           Complications: None; patient tolerated the procedure well.         Disposition: PACU - hemodynamically stable.         Condition: stable  Barbara Wang. Andrey Campanile, MD, FACS General, Bariatric, & Minimally Invasive Surgery [facility], [place]

## 2012-05-11 NOTE — Anesthesia Postprocedure Evaluation (Signed)
  Anesthesia Post-op Note  Patient: Barbara Wang  Procedure(s) Performed: Procedure(s) (LRB) with comments: LAPAROSCOPIC CHOLECYSTECTOMY WITH INTRAOPERATIVE CHOLANGIOGRAM (N/A)  Patient Location: PACU  Anesthesia Type: General  Level of Consciousness: awake and alert   Airway and Oxygen Therapy: Patient Spontanous Breathing  Post-op Pain: none  Post-op Assessment: Post-op Vital signs reviewed, Patient's Cardiovascular Status Stable, Respiratory Function Stable, Patent Airway and No signs of Nausea or vomiting  Post-op Vital Signs: Reviewed and stable  Complications: No apparent anesthesia complications

## 2012-05-11 NOTE — Progress Notes (Signed)
[facility] notified that pt bp is 170/76 pt is asymptomatic new orders given will continue to monitor Barbara Skill LPN

## 2012-05-11 NOTE — Transfer of Care (Signed)
Immediate Anesthesia Transfer of Care Note  Patient: Barbara Wang  Procedure(s) Performed: Procedure(s) (LRB) with comments: LAPAROSCOPIC CHOLECYSTECTOMY WITH INTRAOPERATIVE CHOLANGIOGRAM (N/A)  Patient Location: PACU  Anesthesia Type: General  Level of Consciousness: awake  Airway & Oxygen Therapy: Patient Spontanous Breathing and Patient connected to face mask oxygen  Post-op Assessment: Report given to PACU RN and Post -op Vital signs reviewed and stable  Post vital signs: Reviewed and stable  Complications: No apparent anesthesia complications

## 2012-05-12 LAB — GLUCOSE, CAPILLARY: Glucose-Capillary: 273 mg/dL — ABNORMAL HIGH (ref 70–99)

## 2012-05-12 MED ORDER — OMEPRAZOLE 40 MG PO CPDR: 40.0000 mg | DELAYED_RELEASE_CAPSULE | Freq: Every day | ORAL | Status: DC

## 2012-05-12 MED ORDER — METOPROLOL TARTRATE 12.5 MG HALF TABLET: 12.5000 mg | ORAL_TABLET | Freq: Two times a day (BID) | ORAL | Status: DC

## 2012-05-12 MED ORDER — INSULIN GLARGINE 100 UNIT/ML ~~LOC~~ SOLN: 40.0000 [IU] | Freq: Two times a day (BID) | SUBCUTANEOUS | Status: DC

## 2012-05-12 MED ORDER — METOPROLOL TARTRATE 12.5 MG HALF TABLET
12.5000 mg | ORAL_TABLET | Freq: Two times a day (BID) | ORAL | Status: DC
  Filled 2012-05-12 (×2): qty 1

## 2012-05-12 MED ORDER — INSULIN ASPART 100 UNIT/ML ~~LOC~~ SOLN: 4.0000 [IU] | Freq: Three times a day (TID) | SUBCUTANEOUS | Status: DC

## 2012-05-12 MED ORDER — INSULIN GLARGINE 100 UNIT/ML ~~LOC~~ SOLN
40.0000 [IU] | Freq: Two times a day (BID) | SUBCUTANEOUS | Status: DC
  Administered 2012-05-12: 40 [IU] via SUBCUTANEOUS

## 2012-05-12 MED ORDER — WHITE PETROLATUM GEL
Status: AC
  Administered 2012-05-12: 07:00:00
  Filled 2012-05-12: qty 5

## 2012-05-12 MED ORDER — HYDROCODONE-ACETAMINOPHEN 5-325 MG PO TABS: 1.0000 | ORAL_TABLET | Freq: Four times a day (QID) | ORAL | Status: AC | PRN

## 2012-05-12 NOTE — Discharge Summary (Signed)
PATIENT DETAILS Name: Barbara Wang Age: 46 y.o. Sex: female Date of Birth: 04-Nov-1965 MRN: [id number]. Admit Date: 05/09/2012 Admitting Physician: Dorothea Ogle, MD WUJ:[id number],WGNFAOZH, MD  Recommendations for Outpatient Follow-up:  1. Further optimization of DM and anti Hypertensive medications 2. If RUQ pain recurs-needs GI work up and referral  PRIMARY DISCHARGE DIAGNOSIS:  Principal Problem:  *Biliary Colic/Dyskinesia  Active Problems:  Pancreatits  Diabetes mellitus  HTN (hypertension)  Hypokalemia      PAST MEDICAL HISTORY: Past Medical History  Diagnosis Date  . Diabetes mellitus   . Hypertension   . Pancreatitis     DISCHARGE MEDICATIONS:   Medication List     As of 05/12/2012 10:26 AM    TAKE these medications         aspirin EC 81 MG tablet   Take 81 mg by mouth daily.      HYDROcodone-acetaminophen 5-325 MG per tablet   Commonly known as: NORCO/VICODIN   Take 1-2 tablets by mouth every 6 (six) hours as needed.      insulin aspart 100 UNIT/ML injection   Commonly known as: novoLOG   Inject 4-10 Units into the skin 3 (three) times daily before meals. Sliding scale      insulin glargine 100 UNIT/ML injection   Commonly known as: LANTUS   Inject 40 Units into the skin 2 (two) times daily.      metoprolol tartrate 12.5 mg Tabs   Commonly known as: LOPRESSOR   Take 0.5 tablets (12.5 mg total) by mouth 2 (two) times daily.      nitrofurantoin (macrocrystal-monohydrate) 100 MG capsule   Commonly known as: MACROBID   Take 100 mg by mouth 2 (two) times daily.          BRIEF HPI:  See H&P, Labs, Consult and Test reports for all details in brief, patient was admitted for RUQ pain and vomiting. She recently had a admission at [facility] for similar complaints, she had a MRCP Abd and CT abd and was subsequently discharged to follow with GI as outpatient.  CONSULTATIONS:   general surgery  PERTINENT RADIOLOGIC STUDIES: Dg  Cholangiogram Operative  05/11/2012  *RADIOLOGY REPORT*  Clinical data:  Laparoscopic cholecystectomy.  INTRAOPERATIVE CHOLANGIOGRAM 05/11/2012:  Comparison: Abdominal ultrasound a 05/09/2012.  Findings:  A series of cholangiographic images from the C-arm fluoroscopic device were submitted for interpretation post- operatively.  The cannula is present in the cystic duct remnant with excellent opacification of the common bile duct, common hepatic duct, and proximal intrahepatic ducts.  No filling defects to suggest retained bile duct stones.  Excellent antegrade flow into the duodenum.  No extravasation.  The radiologic technologist documented 2.7 seconds of fluoroscopy time.  Please correlate with findings at real time fluoroscopy.  IMPRESSION: Normal intraoperative cholangiogram.  These images were submitted for radiologic interpretation only. Please see the procedural report for the amount of contrast and the fluoroscopy time utilized.   Original Report Authenticated By: Arnell Sieving, M.D.    US Abdomen Complete  05/09/2012  *RADIOLOGY REPORT*  Clinical Data:  Abdominal pain, nausea and vomiting.  COMPLETE ABDOMINAL ULTRASOUND  Comparison:  Right upper quadrant abdomen ultrasound dated 04/13/2012 and abdomen CT dated 04/13/2012.  Findings:  Gallbladder:  No gallstones, gallbladder wall thickening, or pericholecystic fluid.  Common bile duct:  Normal in caliber, measuring 4.9 mm in diameter proximally.  Liver:  No focal lesion identified.  Within normal limits in parenchymal echogenicity.  IVC:  Appears normal.  Pancreas:  Poorly visualized.  The visualized portions appear normal.  Spleen:  Normal, measuring 6.6 cm in length.  Right Kidney:  Normal, measuring 10.2 cm in length.  Left Kidney:  Normal, measuring 11.5 cm in length.  Abdominal aorta:  No aneurysm identified.  IMPRESSION: Normal examination.   Original Report Authenticated By: Darrol Angel, M.D.    Nm Hepato W/eject Fract  05/10/2012   *RADIOLOGY REPORT*  Clinical Data:  Right upper quadrant abdominal pain.  NUCLEAR MEDICINE HEPATOBILIARY IMAGING WITH GALLBLADDER EF  Technique: Sequential images of the abdomen were obtained out to 60 minutes following intravenous administration of radiopharmaceutical.  After the slow intravenous infusion of 1.6 uCg Cholecystokinin, the gallbladder ejection fraction was determined.  Radiopharmaceutical:  5.0 mCi Tc-65m Choletec  Comparison: Abdominal ultrasound 05/09/2012.  Findings:  Initial images demonstrate homogenous hepatic activity and prompt visualization of the gallbladder and biliary system.  The stimulated portion of the study demonstrates limited gallbladder contraction and progressive small bowel activity.  The gallbladder ejection fraction calculated at approximately 30 minutes is 23.2%.  Normal ejection fractions are considered greater than 30%.  The patient did experience symptoms (abdominal pain) during CCK administration.  IMPRESSION:  1.  The cystic and common bile ducts are patent. 2.  Mildly decreased gallbladder ejection fraction of 23%.  This may indicate the presence of biliary dyskinesia.   Original Report Authenticated By: Gerrianne Scale, M.D.      PERTINENT LAB RESULTS: CBC:  Basename 05/11/12 0645 05/10/12 0541  WBC 7.0 6.3  HGB 12.1 12.2  HCT 36.4 36.3  PLT 349 365   CMET CMP     Component Value Date/Time   NA 140 05/11/2012 0645   K 3.5 05/11/2012 0645   CL 106 05/11/2012 0645   CO2 25 05/11/2012 0645   GLUCOSE 167* 05/11/2012 0645   BUN 4* 05/11/2012 0645   CREATININE 0.46* 05/11/2012 0645   CALCIUM 9.2 05/11/2012 0645   PROT 6.5 05/11/2012 0645   ALBUMIN 3.0* 05/11/2012 0645   AST 20 05/11/2012 0645   ALT 14 05/11/2012 0645   ALKPHOS 63 05/11/2012 0645   BILITOT 0.3 05/11/2012 0645   GFRNONAA >90 05/11/2012 0645   GFRAA >90 05/11/2012 0645    GFR Estimated Creatinine Clearance: 87.3 ml/min (by C-G formula based on Cr of 0.46).  Basename 05/11/12 0645 05/10/12  0541  LIPASE 52 63*  AMYLASE -- --   No results found for this basename: CKTOTAL:3,CKMB:3,CKMBINDEX:3,TROPONINI:3 in the last 72 hours No components found with this basename: POCBNP:3 No results found for this basename: DDIMER:2 in the last 72 hours  Basename 05/09/12 2210  HGBA1C 12.3*    Basename 05/09/12 2210  CHOL 101  HDL 34*  LDLCALC 45  TRIG 829  CHOLHDL 3.0  LDLDIRECT --    Basename 05/09/12 2210  TSH 0.407  T4TOTAL --  T3FREE --  THYROIDAB --   No results found for this basename: VITAMINB12:2,FOLATE:2,FERRITIN:2,TIBC:2,IRON:2,RETICCTPCT:2 in the last 72 hours Coags: No results found for this basename: PT:2,INR:2 in the last 72 hours Microbiology: Recent Results (from the past 240 hour(s))  SURGICAL PCR SCREEN     Status: Normal   Collection Time   05/11/12  9:35 AM      Component Value Range Status Comment   MRSA, PCR NEGATIVE  NEGATIVE Final    Staphylococcus aureus NEGATIVE  NEGATIVE Final      BRIEF HOSPITAL COURSE:   Principal Problem:  *Abdominal  pain, other specified site-Likely Biliary Colic/Dyskinesia -given her history, clinical  features and RUQ pain on exam-it was suggestive of Biliary etiology. CT Abd and MRCP abd done at [facility] was discussed with Radiologist-no apparent CBD stone, suggestion of Gallstones on the CT Abd, however Abd Ultrasound done here on admission was negative for Choledocholithiasis. Her lipase was minimally elevated on admission. She was placed NPO, given IV fluids and other supportive care. General Surgery was consulted, HIDA scan was done, cystic ducts were patent-Gall Bladder EF was low. Subsequently CCS advised Lap Cholecystectomy, which was done on 9/14. This am-she does not have any RUQ pain like previous and has tolerated a regular diet. She is being discharged home, if her GI pain were to recur after cholecystectomy, she will need further GI work up and consultation.  Active Problems:  Diabetes  mellitus -continue with Lantus and Novolog,she does qualify for indigent funds here-and will be provided with supply of Novolog and Lantus prior to discharge.   HTN (hypertension) -c/w Lopressor   Hypokalemia -this was 2/2 to GI loss -K was repleted   TODAY-DAY OF DISCHARGE:  Subjective:   Tallula Romney today has no headache,no chest abdominal pain,no new weakness tingling or numbness, feels much better wants to go home today. No RUQ pain today  Objective:   Blood pressure 150/82, pulse 82, temperature 99 F (37.2 C), temperature source Oral, resp. rate 18, height 5\' 4"  (1.626 m), weight 78.699 kg (173 lb 8 oz), SpO2 92.00%.  Intake/Output Summary (Last 24 hours) at 05/12/12 1026 Last data filed at 05/12/12 0603  Gross per 24 hour  Intake 2988.75 ml  Output     25 ml  Net 2963.75 ml    Exam Awake Alert, Oriented *3, No new F.N deficits, Normal affect [place].AT,PERRAL Supple Neck,No JVD, No cervical lymphadenopathy appriciated.  Symmetrical Chest wall movement, Good air movement bilaterally, CTAB RRR,No Gallops,Rubs or new Murmurs, No Parasternal Heave +ve B.Sounds, Abd Soft, Non tender, No organomegaly appriciated, No rebound -guarding or rigidity.No RUQ pain today. No Cyanosis, Clubbing or edema, No new Rash or bruise  DISCHARGE CONDITION: Stable  DISPOSITION: HOME  DISCHARGE INSTRUCTIONS:    Activity:  As tolerated   Diet recommendation: Diabetic Diet Heart Healthy diet  Follow-up Information    Follow up with Atilano Ina, MD,FACS. Schedule an appointment as soon as possible for a visit in 2 weeks.   Contact information:   [address] [place] [address] [phone]       Please follow up.   Contact information:   Patient has a previously scheduled appointment at [facility] on 05/15/12. Please keep the appointment         Total Time spent on discharge equals 45 minutes.  SignedJeoffrey Massed 05/12/2012 10:26 AM

## 2012-05-12 NOTE — Progress Notes (Signed)
1 Day Post-Op  Subjective: PT doing well.  Tol PO.  +Amb.  Objective: Vital signs in last 24 hours: Temp:  [97.7 F (36.5 C)-99.3 F (37.4 C)] 99 F (37.2 C) (09/15 0602) Pulse Rate:  [66-88] 82  (09/15 0602) Resp:  [10-20] 18  (09/15 0602) BP: (150-177)/(59-89) 150/82 mmHg (09/15 0602) SpO2:  [92 %-100 %] 92 % (09/15 0602) Last BM Date: 05/07/12  Intake/Output from previous day: 09/14 0701 - 09/15 0700 In: 3488.8 [P.O.:360; I.V.:3128.8] Out: 25 [Blood:25] Intake/Output this shift:    General appearance: alert and cooperative GI: soft, non-tender; bowel sounds normal; no masses,  no organomegaly  Lab Results:   [facility] 05/11/12 0645 05/10/12 0541  WBC 7.0 6.3  HGB 12.1 12.2  HCT 36.4 36.3  PLT 349 365   BMET  Basename 05/11/12 0645 05/10/12 0541  NA 140 141  K 3.5 3.6  CL 106 105  CO2 25 29  GLUCOSE 167* 248*  BUN 4* 7  CREATININE 0.46* 0.53  CALCIUM 9.2 9.1   PT/INR No results found for this basename: LABPROT:2,INR:2 in the last 72 hours ABG No results found for this basename: PHART:2,PCO2:2,PO2:2,HCO3:2 in the last 72 hours  Studies/Results: Dg Cholangiogram Operative  05/11/2012  *RADIOLOGY REPORT*  Clinical data:  Laparoscopic cholecystectomy.  INTRAOPERATIVE CHOLANGIOGRAM 05/11/2012:  Comparison: Abdominal ultrasound a 05/09/2012.  Findings:  A series of cholangiographic images from the C-arm fluoroscopic device were submitted for interpretation post- operatively.  The cannula is present in the cystic duct remnant with excellent opacification of the common bile duct, common hepatic duct, and proximal intrahepatic ducts.  No filling defects to suggest retained bile duct stones.  Excellent antegrade flow into the duodenum.  No extravasation.  The radiologic technologist documented 2.7 seconds of fluoroscopy time.  Please correlate with findings at real time fluoroscopy.  IMPRESSION: Normal intraoperative cholangiogram.  These images were submitted for  radiologic interpretation only. Please see the procedural report for the amount of contrast and the fluoroscopy time utilized.   Original Report Authenticated By: Arnell Sieving, M.D.    Nm Hepato W/eject Fract  05/10/2012  *RADIOLOGY REPORT*  Clinical Data:  Right upper quadrant abdominal pain.  NUCLEAR MEDICINE HEPATOBILIARY IMAGING WITH GALLBLADDER EF  Technique: Sequential images of the abdomen were obtained out to 60 minutes following intravenous administration of radiopharmaceutical.  After the slow intravenous infusion of 1.6 uCg Cholecystokinin, the gallbladder ejection fraction was determined.  Radiopharmaceutical:  5.0 mCi Tc-81m Choletec  Comparison: Abdominal ultrasound 05/09/2012.  Findings:  Initial images demonstrate homogenous hepatic activity and prompt visualization of the gallbladder and biliary system.  The stimulated portion of the study demonstrates limited gallbladder contraction and progressive small bowel activity.  The gallbladder ejection fraction calculated at approximately 30 minutes is 23.2%.  Normal ejection fractions are considered greater than 30%.  The patient did experience symptoms (abdominal pain) during CCK administration.  IMPRESSION:  1.  The cystic and common bile ducts are patent. 2.  Mildly decreased gallbladder ejection fraction of 23%.  This may indicate the presence of biliary dyskinesia.   Original Report Authenticated By: Gerrianne Scale, M.D.     Anti-infectives: Anti-infectives     Start     Dose/Rate Route Frequency Ordered Stop   05/11/12 1300   ciprofloxacin (CIPRO) IVPB 400 mg  Status:  Discontinued        400 mg 200 mL/hr over 60 Minutes Intravenous 120 min pre-op 05/11/12 1300 05/11/12 1302   05/09/12 2200   cefTRIAXone (ROCEPHIN)  1 g in dextrose 5 % 50 mL IVPB  Status:  Discontinued        1 g 100 mL/hr over 30 Minutes Intravenous Every 24 hours 05/09/12 2156 05/10/12 0929   05/09/12 1900   cefTRIAXone (ROCEPHIN) 1 g in dextrose 5 % 50  mL IVPB        1 g 100 mL/hr over 30 Minutes Intravenous  Once 05/09/12 1850 05/09/12 1941          Assessment/Plan: s/p Procedure(s) (LRB) with comments: LAPAROSCOPIC CHOLECYSTECTOMY WITH INTRAOPERATIVE CHOLANGIOGRAM (N/A) 1.OK from Surgery standpoint to DC home 2.PT to f/u with Surgery x 2 weeks    LOS: 3 days    Marigene Ehlers., [facility] 05/12/2012

## 2012-05-12 NOTE — Progress Notes (Signed)
   CARE MANAGEMENT NOTE 05/12/2012  Patient:  Barbara Wang, Barbara Wang   Account Number:  [id number]  Date Initiated:  05/12/2012  Documentation initiated by:  [facility]  Subjective/Objective Assessment:   DM     Action/Plan:   unemployed, medication assistance   Anticipated DC Date:  05/12/2012   Anticipated DC Plan:  HOME/SELF CARE      DC Planning Services  CM consult  Medication Assistance      Choice offered to / List presented to:             Status of service:  Completed, signed off Medicare Important Message given?   (If response is "NO", the following Medicare IM given date fields will be blank) Date Medicare IM given:   Date Additional Medicare IM given:    Discharge Disposition:  HOME W HOME HEALTH SERVICES  Per UR Regulation:    If discussed at Long Length of Stay Meetings, dates discussed:    Comments:  05/12/2012 1000 Spoke to pt and states she is currently not working and has no drug or insurance coverage. Provided pt with a discount card. Pt is eligible for [employer] fund. Used fund for [employer] and Lantus. Explained to her that fund can only be used once per year. Dr. Jerral Ralph complete [employer] PAP form for Lantus. NCM explained she would need to added proof of income and fax to drug manufacture. She has an appt with her PCP on 9/18. Isidoro Donning RN CCM Case Mgmt phone [phone]

## 2012-05-13 ENCOUNTER — Encounter (HOSPITAL_COMMUNITY): Payer: Self-pay | Admitting: General Surgery

## 2012-05-27 ENCOUNTER — Telehealth (INDEPENDENT_AMBULATORY_CARE_PROVIDER_SITE_OTHER): Payer: Self-pay

## 2012-05-27 DIAGNOSIS — G8918 Other acute postprocedural pain: Secondary | ICD-10-CM

## 2012-05-27 MED ORDER — OXYCODONE HCL 5 MG PO TABS: 5.0000 mg | ORAL_TABLET | Freq: Four times a day (QID) | ORAL | Status: DC | PRN

## 2012-05-27 NOTE — Telephone Encounter (Signed)
Post op appt made 06/04/12 @ 3:45 pm. Will call patient with appt when Dr Andrey Campanile addresses pain medication question this pm.

## 2012-05-27 NOTE — Telephone Encounter (Signed)
MS Sprick RETURNED PHONE CALL FROM EARLIER TODAY. I RELAYED MESSAGE RE WRITTEN RX READY FOR PICK-UP AT FRONT DESK AND ALSO HER FOLLOW UP APPOINTMENT WITH DR. Bea Laura. [facility]/GY

## 2012-05-27 NOTE — Telephone Encounter (Signed)
The patient called for more pain medicine.  She was using Vicodin but feels like the Tylenol "gripes" her stomach.  She wants something without Tylenol.  She did have pus one time at her umbilicus but she cleaned it with alcohol and it's better today.  She has no fever.  She uses the [facility] in [place] [phone].  I told her Dr Andrey Campanile is here this afternoon and if he will write something she may have to pick it up this afternoon.  Please let her know.

## 2012-05-27 NOTE — Telephone Encounter (Signed)
Per Dr Andrey Campanile okay to write for Oxycodone IR 5 mg tablets one po q6hrs prn pain #30 with no refill. This is patient's last narcotic refill. [name] for patient to call back and let her know RX is written and at the front for pick up. Also to inform her of her post op appt that is scheduled. Awaiting patient's call.

## 2012-06-04 ENCOUNTER — Encounter (INDEPENDENT_AMBULATORY_CARE_PROVIDER_SITE_OTHER): Payer: Self-pay | Admitting: General Surgery

## 2012-06-04 ENCOUNTER — Ambulatory Visit (INDEPENDENT_AMBULATORY_CARE_PROVIDER_SITE_OTHER): Payer: Self-pay | Admitting: General Surgery

## 2012-06-04 VITALS — BP 132/84 | HR 95 | Temp 97.2°F | Resp 18 | Ht 64.0 in | Wt 167.8 lb

## 2012-06-04 DIAGNOSIS — R82998 Other abnormal findings in urine: Secondary | ICD-10-CM

## 2012-06-04 DIAGNOSIS — Z09 Encounter for follow-up examination after completed treatment for conditions other than malignant neoplasm: Secondary | ICD-10-CM

## 2012-06-04 DIAGNOSIS — R8290 Unspecified abnormal findings in urine: Secondary | ICD-10-CM

## 2012-06-04 NOTE — Progress Notes (Signed)
Subjective:     Patient ID: Barbara Wang, female   DOB: 1966-06-13, 46 y.o.   MRN: [id number]  HPI 46 year old [place] American female comes in for followup after undergoing laparoscopic cholecystectomy with interoperative cholangiogram for biliary dyskinesia. She was admitted to the medicine service from September 12 through the 15th for abdominal pain. She had been previously admitted for a mild case of pancreatitis. However her ultrasound did not reveal any gallstones. An MRCP did not show any ductal dilatation or common bile duct stones. She underwent a HIDA scan which showed biliary dyskinesia. She states that the pain that she was having before surgery has gone. She states now she has some intermittent left upper clot returned and lower back pain. She states the pain is relieved after having gas or passing a bowel movement. She denies any fever, chills, nausea or vomiting. She complains of foul-smelling urine. She reports a good appetite. She denies any pain with urination. She is having a bowel movement about every 4 days. She is still taking some pain medication  Review of Systems     Objective:   Physical Exam BP 132/84  Pulse 95  Temp 97.2 F (36.2 C) (Temporal)  Resp 18  Ht 5\' 4"  (1.626 m)  Wt 167 lb 12.8 oz (76.114 kg)  BMI 28.80 kg/m2  Gen: alert, NAD, non-toxic appearing Pupils: equal, no scleral icterus Pulm: Lungs clear to auscultation, symmetric chest rise CV: regular rate and rhythm Abd: soft, nontender, nondistended. Well-healed trocar sites. No cellulitis. No incisional hernia Ext: no edema, no calf tenderness Skin: no rash, no jaundice     Assessment:     Status post laparoscopic cholecystectomy with interoperative cholangiogram for biliary dyskinesia    Plan:     I reviewed her pathology report which showed mild chronic inflammation of her gallbladder. She does not appear to be ill. We discussed the importance of a high-fiber diet and drinking plenty  of fluids on a daily basis. I encouraged her to wean herself off of the pain medication. We discussed taking stool softeners as well as MiraLAX as needed to keep her regular. Says she will not be able to see her primary care physician for another couple of weeks, we will order a urinalysis with culture. We will contact her if her urinalysis is abnormal. Followup as needed  Mary Sella. Andrey Campanile, MD, FACS General, Bariatric, & Minimally Invasive Surgery [facility], [place]

## 2012-06-04 NOTE — Patient Instructions (Signed)
Try to drink 6-8 glasses of water per day decrease amount of pain medicine you are taking Try some miralax for constipation We will have your urine checked

## 2012-06-05 ENCOUNTER — Telehealth (INDEPENDENT_AMBULATORY_CARE_PROVIDER_SITE_OTHER): Payer: Self-pay | Admitting: General Surgery

## 2012-06-05 LAB — URINALYSIS, ROUTINE W REFLEX MICROSCOPIC
Bilirubin Urine: NEGATIVE
Glucose, UA: 100 mg/dL — AB
Hgb urine dipstick: NEGATIVE
Ketones, ur: NEGATIVE mg/dL
Leukocytes, UA: NEGATIVE
Nitrite: NEGATIVE
Protein, ur: NEGATIVE mg/dL
Specific Gravity, Urine: 1.017 (ref 1.005–1.030)
Urobilinogen, UA: 0.2 mg/dL (ref 0.0–1.0)
pH: 6 (ref 5.0–8.0)

## 2012-06-05 NOTE — Telephone Encounter (Signed)
[name] letting patient know testing was normal and to call back with any questions. Patient had a UA done yesterday due to foul smelling urine. Dr Andrey Campanile ordered UA. Results reviewed by Dr Derrell Lolling and patient does not need antibiotics. Results are normal. To follow up with her PCP.

## 2012-06-06 NOTE — Telephone Encounter (Signed)
The patient called stating she hadn't heard about her results.  I saw where there was a message left and I told her the results were normal and she didn't need antibiotics.  She asked why her urine smelled so bad and I told her I didn't know but she should follow up with her PCP.

## 2012-06-21 ENCOUNTER — Telehealth (INDEPENDENT_AMBULATORY_CARE_PROVIDER_SITE_OTHER): Payer: Self-pay

## 2012-06-21 NOTE — Telephone Encounter (Signed)
Norco # 30 with no refills called to [facility] with [place].

## 2012-06-21 NOTE — Telephone Encounter (Signed)
Pt called requesting refill pain med. Pt 5 wk po lap chole. Pt states she still has soreness at umbilical area. No fever,swelling,redness or drainage. Pt states wound has healed.Pt advised I will forward request to Dr Andrey Campanile to review. Pt uses [facility] [place].

## 2012-06-21 NOTE — Telephone Encounter (Signed)
Can have vicodin protocol. No more refills.

## 2012-06-21 NOTE — Telephone Encounter (Signed)
Patient aware.

## 2012-07-15 ENCOUNTER — Emergency Department (HOSPITAL_COMMUNITY): Payer: Self-pay

## 2012-07-15 ENCOUNTER — Emergency Department (HOSPITAL_COMMUNITY)
Admission: EM | Admit: 2012-07-15 | Discharge: 2012-07-16 | Disposition: A | Discharge status: Home / Self Care | Payer: Self-pay | Attending: Emergency Medicine | Admitting: Emergency Medicine

## 2012-07-15 ENCOUNTER — Encounter (HOSPITAL_COMMUNITY): Payer: Self-pay | Admitting: Adult Health

## 2012-07-15 DIAGNOSIS — Z79899 Other long term (current) drug therapy: Secondary | ICD-10-CM | POA: Insufficient documentation

## 2012-07-15 DIAGNOSIS — R1110 Vomiting, unspecified: Secondary | ICD-10-CM | POA: Insufficient documentation

## 2012-07-15 DIAGNOSIS — I10 Essential (primary) hypertension: Secondary | ICD-10-CM | POA: Insufficient documentation

## 2012-07-15 DIAGNOSIS — F17200 Nicotine dependence, unspecified, uncomplicated: Secondary | ICD-10-CM | POA: Insufficient documentation

## 2012-07-15 DIAGNOSIS — Z794 Long term (current) use of insulin: Secondary | ICD-10-CM | POA: Insufficient documentation

## 2012-07-15 DIAGNOSIS — R109 Unspecified abdominal pain: Secondary | ICD-10-CM | POA: Insufficient documentation

## 2012-07-15 DIAGNOSIS — E119 Type 2 diabetes mellitus without complications: Secondary | ICD-10-CM | POA: Insufficient documentation

## 2012-07-15 DIAGNOSIS — Z9889 Other specified postprocedural states: Secondary | ICD-10-CM | POA: Insufficient documentation

## 2012-07-15 DIAGNOSIS — N390 Urinary tract infection, site not specified: Secondary | ICD-10-CM | POA: Insufficient documentation

## 2012-07-15 DIAGNOSIS — Z8719 Personal history of other diseases of the digestive system: Secondary | ICD-10-CM | POA: Insufficient documentation

## 2012-07-15 DIAGNOSIS — R509 Fever, unspecified: Secondary | ICD-10-CM | POA: Insufficient documentation

## 2012-07-15 LAB — URINALYSIS, ROUTINE W REFLEX MICROSCOPIC
Glucose, UA: NEGATIVE mg/dL
Ketones, ur: 15 mg/dL — AB
Nitrite: NEGATIVE
Protein, ur: NEGATIVE mg/dL
Specific Gravity, Urine: 1.026 (ref 1.005–1.030)
Urobilinogen, UA: 0.2 mg/dL (ref 0.0–1.0)
pH: 5.5 (ref 5.0–8.0)

## 2012-07-15 LAB — CBC WITH DIFFERENTIAL/PLATELET
Basophils Absolute: 0 10*3/uL (ref 0.0–0.1)
Basophils Relative: 0 % (ref 0–1)
Eosinophils Absolute: 0 10*3/uL (ref 0.0–0.7)
Eosinophils Relative: 0 % (ref 0–5)
HCT: 37.9 % (ref 36.0–46.0)
Hemoglobin: 13.2 g/dL (ref 12.0–15.0)
Lymphocytes Relative: 21 % (ref 12–46)
Lymphs Abs: 1 10*3/uL (ref 0.7–4.0)
MCH: 29.1 pg (ref 26.0–34.0)
MCHC: 34.8 g/dL (ref 30.0–36.0)
MCV: 83.7 fL (ref 78.0–100.0)
Monocytes Absolute: 0.9 10*3/uL (ref 0.1–1.0)
Monocytes Relative: 19 % — ABNORMAL HIGH (ref 3–12)
Neutro Abs: 2.8 10*3/uL (ref 1.7–7.7)
Neutrophils Relative %: 60 % (ref 43–77)
Platelets: 210 10*3/uL (ref 150–400)
RBC: 4.53 MIL/uL (ref 3.87–5.11)
RDW: 12.9 % (ref 11.5–15.5)
WBC: 4.7 10*3/uL (ref 4.0–10.5)

## 2012-07-15 LAB — COMPREHENSIVE METABOLIC PANEL
ALT: 23 U/L (ref 0–35)
AST: 26 U/L (ref 0–37)
Albumin: 3.6 g/dL (ref 3.5–5.2)
Alkaline Phosphatase: 78 U/L (ref 39–117)
BUN: 19 mg/dL (ref 6–23)
CO2: 26 mEq/L (ref 19–32)
Calcium: 9.2 mg/dL (ref 8.4–10.5)
Chloride: 97 mEq/L (ref 96–112)
Creatinine, Ser: 0.91 mg/dL (ref 0.50–1.10)
GFR calc Af Amer: 85 mL/min — ABNORMAL LOW (ref >90)
GFR calc non Af Amer: 73 mL/min — ABNORMAL LOW (ref >90)
Glucose, Bld: 142 mg/dL — ABNORMAL HIGH (ref 70–99)
Potassium: 3.6 mEq/L (ref 3.5–5.1)
Sodium: 137 mEq/L (ref 135–145)
Total Bilirubin: 0.5 mg/dL (ref 0.3–1.2)
Total Protein: 7.5 g/dL (ref 6.0–8.3)

## 2012-07-15 LAB — URINE MICROSCOPIC-ADD ON

## 2012-07-15 LAB — LIPASE, BLOOD: Lipase: 38 U/L (ref 11–59)

## 2012-07-15 MED ORDER — SODIUM CHLORIDE 0.9 % IV BOLUS (SEPSIS)
1000.0000 mL | Freq: Once | INTRAVENOUS | Status: AC
  Administered 2012-07-15: 1000 mL via INTRAVENOUS

## 2012-07-15 NOTE — ED Notes (Addendum)
Presents with sternal discomfort that is better after coughing. Recently had laproscopic gallbladder surgery the 14th. Cough is described as productive and described as clear yelllow sputum. Reports fever of 101.0 today. Bilateral breath sounds clear.

## 2012-07-15 NOTE — ED Provider Notes (Signed)
History   This chart was scribed for Loren Racer, MD by Gerlean Ren, ED Scribe. This patient was seen in room TR07C/TR07C and the patient's care was started at 9:22 PM    CSN: [id number]  Arrival date & time 07/15/12  2045   First MD Initiated Contact with Patient 07/15/12 2106      Chief Complaint  Patient presents with  . Cough     The history is provided by the patient. No language interpreter was used.   Barbara Wang is a 46 y.o. female who presents to the Emergency Department complaining of 3-4 days of constant phlegm-producing cough with post-tussive non-bloody, non-bilious emesis when cough is severe with associated fever as high as 101.  Pt denies abdominal pain, chest pain, back pain, diarrhea, or any unusual foods.  Pt states food intake has been very little because eating causes emesis.  Pt had cholecystectomy 11/14.   Past Medical History  Diagnosis Date  . Diabetes mellitus   . Hypertension   . Pancreatitis     Past Surgical History  Procedure Date  . Cesarean section     x2  . Tubal ligation   . Abdominal hysterectomy   . Rotator cuff repair   . Cholecystectomy 05/11/2012    Procedure: LAPAROSCOPIC CHOLECYSTECTOMY WITH INTRAOPERATIVE CHOLANGIOGRAM;  Surgeon: Atilano Ina, MD,FACS;  Location: [facility] OR;  Service: General;  Laterality: N/A;    History reviewed. No pertinent family history.  History  Substance Use Topics  . Smoking status: Current Every Day Smoker -- 0.5 packs/day  . Smokeless tobacco: Not on file  . Alcohol Use: No    No OB history provided.  Review of Systems  Constitutional: Positive for fever.  HENT: Negative for sore throat.   Respiratory: Positive for cough. Negative for wheezing.   Cardiovascular: Negative for chest pain.  Gastrointestinal: Positive for nausea and vomiting. Negative for abdominal pain, diarrhea and abdominal distention.  Genitourinary: Positive for decreased urine volume. Negative for dysuria, urgency  and flank pain.  Musculoskeletal: Negative for back pain.    Allergies  Penicillins; Sulfa antibiotics; and Tramadol  Home Medications   Current Outpatient Rx  Name  Route  Sig  Dispense  Refill  . AMITRIPTYLINE HCL 25 MG PO TABS   Oral   Take 25 mg by mouth at bedtime.         . ASPIRIN EC 81 MG PO TBEC   Oral   Take 81 mg by mouth daily.         . INSULIN ASPART 100 UNIT/ML [place] SOLN   Subcutaneous   Inject 2-10 Units into the skin 3 (three) times daily before meals. Sliding scale:  1 unit per 100 CBG         . INSULIN GLARGINE 100 UNIT/ML [place] SOLN   Subcutaneous   Inject 25-40 Units into the skin at bedtime. Varies based on how much she eats         . LISINOPRIL-HYDROCHLOROTHIAZIDE 10-12.5 MG PO TABS   Oral   Take 1 tablet by mouth daily.         Marland Kitchen PRAVASTATIN SODIUM 40 MG PO TABS   Oral   Take 40 mg by mouth daily.           BP 100/64  Pulse 120  Temp 99.6 F (37.6 C) (Oral)  Resp 20  SpO2 95%  Physical Exam  Nursing note and vitals reviewed. Constitutional: She is oriented to person, place, and time. She  appears well-developed and well-nourished.  HENT:  Head: Normocephalic and atraumatic.  Eyes: Conjunctivae normal and EOM are normal. Pupils are equal, round, and reactive to light.  Neck: Normal range of motion. Neck supple.  Cardiovascular: Normal rate, regular rhythm and normal heart sounds.   Pulmonary/Chest: Effort normal and breath sounds normal. She has no wheezes.  Abdominal: Soft. Bowel sounds are normal. She exhibits no distension. There is no tenderness.  Musculoskeletal: Normal range of motion.  Neurological: She is alert and oriented to person, place, and time.  Skin: Skin is warm and dry.  Psychiatric: She has a normal mood and affect.    ED Course  Procedures (including critical care time) DIAGNOSTIC STUDIES: Oxygen Saturation is 95% on room air, adequate by my interpretation.    COORDINATION OF CARE: 9:28 PM- Patient  informed of clinical course, understands medical decision-making process, and agrees with plan.  Ordered IV bolus, CBC, c-met, lipase, urinalysis, abdominal XR, chest XR.      Labs Reviewed - No data to display Dg Abd Acute W/chest  07/15/2012  *RADIOLOGY REPORT*  Clinical Data: Vomiting.  ACUTE ABDOMEN SERIES (ABDOMEN 2 VIEW & CHEST 1 VIEW)  Comparison: CT scan 04/13/2012.  Findings: The upright chest x-ray demonstrates no acute cardiopulmonary findings.  The heart is normal in size.  Two views of the abdomen demonstrate moderate air and stool throughout the colon.  No distended small bowel loops to suggest a small bowel obstruction.  No free air.  The soft tissue shadows of the abdomen are maintained.  The bony structures are intact.  IMPRESSION:  1.  No acute cardial pulmonary findings. 2.  Plain film findings suggest a colonic ileus.   Original Report Authenticated By: Rudie Meyer, M.D.      No diagnosis found.    MDM  I personally performed the services described in this documentation, which was scribed in my presence. The recorded information has been reviewed and is accurate.      Loren Racer, MD 07/16/12 [phone]

## 2012-07-15 NOTE — ED Notes (Signed)
Pt returned from radiology.

## 2012-07-16 ENCOUNTER — Emergency Department (HOSPITAL_COMMUNITY): Payer: Self-pay

## 2012-07-16 MED ORDER — HYDROCODONE-ACETAMINOPHEN 5-500 MG PO TABS: 1.0000 | ORAL_TABLET | Freq: Four times a day (QID) | ORAL | Status: DC | PRN

## 2012-07-16 MED ORDER — ONDANSETRON HCL 4 MG PO TABS: 4.0000 mg | ORAL_TABLET | Freq: Four times a day (QID) | ORAL | Status: DC

## 2012-07-16 MED ORDER — LEVOFLOXACIN 500 MG PO TABS: 500.0000 mg | ORAL_TABLET | Freq: Every day | ORAL | Status: DC

## 2012-07-16 MED ORDER — IOHEXOL 300 MG/ML  SOLN
100.0000 mL | Freq: Once | INTRAMUSCULAR | Status: AC | PRN
  Administered 2012-07-16: 100 mL via INTRAVENOUS

## 2012-07-16 MED ORDER — BENZONATATE 100 MG PO CAPS: 100.0000 mg | ORAL_CAPSULE | Freq: Three times a day (TID) | ORAL | Status: DC

## 2012-07-16 MED ORDER — PHENAZOPYRIDINE HCL 200 MG PO TABS: 200.0000 mg | ORAL_TABLET | Freq: Three times a day (TID) | ORAL | Status: DC

## 2012-07-16 MED ORDER — CIPROFLOXACIN HCL 500 MG PO TABS: 500.0000 mg | ORAL_TABLET | Freq: Two times a day (BID) | ORAL | Status: DC

## 2012-07-17 LAB — URINE CULTURE: Colony Count: >=100000

## 2012-07-18 NOTE — ED Notes (Signed)
+   Urine Patient treated with cipro-sensitive to same-chart appended per protocol MD. 

## 2012-10-01 ENCOUNTER — Emergency Department (HOSPITAL_COMMUNITY)
Admission: EM | Admit: 2012-10-01 | Discharge: 2012-10-01 | Disposition: A | Discharge status: Home / Self Care | Payer: Self-pay | Attending: Emergency Medicine | Admitting: Emergency Medicine

## 2012-10-01 ENCOUNTER — Encounter (HOSPITAL_COMMUNITY): Payer: Self-pay | Admitting: *Deleted

## 2012-10-01 DIAGNOSIS — E119 Type 2 diabetes mellitus without complications: Secondary | ICD-10-CM | POA: Insufficient documentation

## 2012-10-01 DIAGNOSIS — K047 Periapical abscess without sinus: Secondary | ICD-10-CM | POA: Insufficient documentation

## 2012-10-01 DIAGNOSIS — I10 Essential (primary) hypertension: Secondary | ICD-10-CM | POA: Insufficient documentation

## 2012-10-01 DIAGNOSIS — Z7982 Long term (current) use of aspirin: Secondary | ICD-10-CM | POA: Insufficient documentation

## 2012-10-01 DIAGNOSIS — Z794 Long term (current) use of insulin: Secondary | ICD-10-CM | POA: Insufficient documentation

## 2012-10-01 DIAGNOSIS — Z8719 Personal history of other diseases of the digestive system: Secondary | ICD-10-CM | POA: Insufficient documentation

## 2012-10-01 DIAGNOSIS — F17200 Nicotine dependence, unspecified, uncomplicated: Secondary | ICD-10-CM | POA: Insufficient documentation

## 2012-10-01 DIAGNOSIS — K029 Dental caries, unspecified: Secondary | ICD-10-CM | POA: Insufficient documentation

## 2012-10-01 MED ORDER — CLINDAMYCIN HCL 150 MG PO CAPS: 150.0000 mg | ORAL_CAPSULE | Freq: Four times a day (QID) | ORAL | Status: DC

## 2012-10-01 MED ORDER — OXYCODONE-ACETAMINOPHEN 5-325 MG PO TABS
1.0000 | ORAL_TABLET | Freq: Once | ORAL | Status: AC
  Administered 2012-10-01: 1 via ORAL
  Filled 2012-10-01: qty 1

## 2012-10-01 MED ORDER — OXYCODONE-ACETAMINOPHEN 10-325 MG PO TABS: 1.0000 | ORAL_TABLET | ORAL | Status: DC | PRN

## 2012-10-01 NOTE — ED Provider Notes (Signed)
History     CSN: [id number]  Arrival date & time 10/01/12  1224   First MD Initiated Contact with Patient 10/01/12 1246      Chief Complaint  Patient presents with  . Dental Pain    (Consider location/radiation/quality/duration/timing/severity/associated sxs/prior treatment) HPI  47 year old female presents complaining of dental pain. Patient reports she has a bad dental decay to the right upper tooth for many years. For the past several days she developed gradual onset of sharp throbbing pain to the gum above the decay tooth.  Pain is moderate to severe, radiates up to her face, worse with eating, not improved with OTC pain meds.  Denies fever, chills, hearing changes, neck stiffness, vision changes, or rash.  No recent trauma.  Has hx of diabetes and HTN.  Past Medical History  Diagnosis Date  . Diabetes mellitus   . Hypertension   . Pancreatitis     Past Surgical History  Procedure Date  . Cesarean section     x2  . Tubal ligation   . Abdominal hysterectomy   . Rotator cuff repair   . Cholecystectomy 05/11/2012    Procedure: LAPAROSCOPIC CHOLECYSTECTOMY WITH INTRAOPERATIVE CHOLANGIOGRAM;  Surgeon: Atilano Ina, MD,FACS;  Location: [facility] OR;  Service: General;  Laterality: N/A;    No family history on file.  History  Substance Use Topics  . Smoking status: Current Every Day Smoker -- 0.5 packs/day  . Smokeless tobacco: Not on file  . Alcohol Use: No    OB History    Grav Para Term Preterm Abortions TAB SAB Ect Mult Living                  Review of Systems  Constitutional: Negative for fever and chills.  HENT: Positive for dental problem. Negative for congestion and sore throat.   Eyes: Negative for pain.  Skin: Negative for rash and wound.    Allergies  Penicillins; Sulfa antibiotics; and Tramadol  Home Medications   Current Outpatient Rx  Name  Route  Sig  Dispense  Refill  . AMITRIPTYLINE HCL 25 MG PO TABS   Oral   Take 25 mg by mouth at bedtime.         . ASPIRIN EC 81 MG PO TBEC   Oral   Take 81 mg by mouth daily.         Marland Kitchen FERROUS SULFATE 325 (65 FE) MG PO TABS   Oral   Take 325 mg by mouth daily with breakfast.         . INSULIN ASPART 100 UNIT/ML [place] SOLN   Subcutaneous   Inject 2-10 Units into the skin 3 (three) times daily before meals. Sliding scale:  1 unit per 100 CBG         . INSULIN GLARGINE 100 UNIT/ML [place] SOLN   Subcutaneous   Inject 25-40 Units into the skin at bedtime. Varies based on how much she eats         . LISINOPRIL-HYDROCHLOROTHIAZIDE 10-12.5 MG PO TABS   Oral   Take 1 tablet by mouth daily.         Marland Kitchen POLYETHYLENE GLYCOL 3350 PO POWD   Oral   Take 17 g by mouth daily.         Marland Kitchen PRAVASTATIN SODIUM 40 MG PO TABS   Oral   Take 40 mg by mouth daily.         Marland Kitchen VITAMIN B-6 50 MG PO TABS   Oral  Take 50 mg by mouth daily.         . TERBINAFINE HCL 250 MG PO TABS   Oral   Take 250 mg by mouth daily. For 12 weeks starting on 09/16/12         . VITAMIN B-12 250 MCG PO TABS   Oral   Take 250 mcg by mouth daily.           BP 173/100  Pulse 116  Temp 98.2 F (36.8 C) (Oral)  Resp 18  Ht 5\' 4"  (1.626 m)  Wt 169 lb (76.658 kg)  BMI 29.01 kg/m2  SpO2 98%  Physical Exam  Nursing note and vitals reviewed. Constitutional: She appears well-developed and well-nourished. She appears distressed (uncomfortable appearing.).  HENT:  Head: Normocephalic and atraumatic.  Right Ear: External ear normal.  Nose: Nose normal.  Mouth/Throat: Oropharynx is clear and moist. No oropharyngeal exudate.    Eyes: Conjunctivae normal are normal.  Neck: Normal range of motion. Neck supple.  Lymphadenopathy:    She has cervical adenopathy.  Neurological: She is alert.  Skin: Skin is warm. No rash noted.  Psychiatric: She has a normal mood and affect.    ED Course  Procedures (including critical care time)  Labs Reviewed - No data to display No results found.   No diagnosis  found.  1:26 PM Pt was seen and evaluated by me for her dental pain.  She appears to have evidence of periapical abscess overlying her R upper second premolar and canine.  Although periapical abscess is amenable for drainage pt declined procedure.  Will give pain meds, abx and referral to dentist in the next 2 days.  Smoking cessation discussed.  BP 173/100  Pulse 116  Temp 98.2 F (36.8 C) (Oral)  Resp 18  Ht 5\' 4"  (1.626 m)  Wt 169 lb (76.658 kg)  BMI 29.01 kg/m2  SpO2 98%  I have reviewed nursing notes and vital signs.  I reviewed available ER/hospitalization records thought the EMR  1. Periapical abscess MDM          Fayrene Helper, PA-C 10/01/12 1328

## 2012-10-01 NOTE — ED Provider Notes (Signed)
Medical screening examination/treatment/procedure(s) were performed by non-physician practitioner and as supervising physician I was immediately available for consultation/collaboration.   Gerhard Munch, MD 10/01/12 [phone]

## 2012-10-01 NOTE — ED Notes (Signed)
PT with R upper gum pain x 3 days there her upper R molars have been broken off for some time.

## 2012-10-04 ENCOUNTER — Emergency Department (HOSPITAL_COMMUNITY): Payer: No Typology Code available for payment source

## 2012-10-04 ENCOUNTER — Emergency Department (HOSPITAL_COMMUNITY)
Admission: EM | Admit: 2012-10-04 | Discharge: 2012-10-04 | Disposition: A | Discharge status: Home Health | Payer: No Typology Code available for payment source | Attending: Emergency Medicine | Admitting: Emergency Medicine

## 2012-10-04 ENCOUNTER — Encounter (HOSPITAL_COMMUNITY): Payer: Self-pay | Admitting: Emergency Medicine

## 2012-10-04 DIAGNOSIS — Y9389 Activity, other specified: Secondary | ICD-10-CM | POA: Insufficient documentation

## 2012-10-04 DIAGNOSIS — S0990XA Unspecified injury of head, initial encounter: Secondary | ICD-10-CM | POA: Insufficient documentation

## 2012-10-04 DIAGNOSIS — Z79899 Other long term (current) drug therapy: Secondary | ICD-10-CM | POA: Insufficient documentation

## 2012-10-04 DIAGNOSIS — R000 Tachycardia, unspecified: Secondary | ICD-10-CM | POA: Insufficient documentation

## 2012-10-04 DIAGNOSIS — E119 Type 2 diabetes mellitus without complications: Secondary | ICD-10-CM | POA: Insufficient documentation

## 2012-10-04 DIAGNOSIS — S39012A Strain of muscle, fascia and tendon of lower back, initial encounter: Secondary | ICD-10-CM

## 2012-10-04 DIAGNOSIS — Z8719 Personal history of other diseases of the digestive system: Secondary | ICD-10-CM | POA: Insufficient documentation

## 2012-10-04 DIAGNOSIS — F17200 Nicotine dependence, unspecified, uncomplicated: Secondary | ICD-10-CM | POA: Insufficient documentation

## 2012-10-04 DIAGNOSIS — S335XXA Sprain of ligaments of lumbar spine, initial encounter: Secondary | ICD-10-CM | POA: Insufficient documentation

## 2012-10-04 DIAGNOSIS — Y92410 Unspecified street and highway as the place of occurrence of the external cause: Secondary | ICD-10-CM | POA: Insufficient documentation

## 2012-10-04 DIAGNOSIS — R51 Headache: Secondary | ICD-10-CM

## 2012-10-04 DIAGNOSIS — Z794 Long term (current) use of insulin: Secondary | ICD-10-CM | POA: Insufficient documentation

## 2012-10-04 DIAGNOSIS — I10 Essential (primary) hypertension: Secondary | ICD-10-CM | POA: Insufficient documentation

## 2012-10-04 DIAGNOSIS — Z7982 Long term (current) use of aspirin: Secondary | ICD-10-CM | POA: Insufficient documentation

## 2012-10-04 MED ORDER — OXYCODONE-ACETAMINOPHEN 5-325 MG PO TABS
2.0000 | ORAL_TABLET | Freq: Once | ORAL | Status: AC
  Administered 2012-10-04: 2 via ORAL
  Filled 2012-10-04: qty 2

## 2012-10-04 MED ORDER — NAPROXEN 500 MG PO TABS: 500.0000 mg | ORAL_TABLET | Freq: Two times a day (BID) | ORAL | Status: DC

## 2012-10-04 MED ORDER — OXYCODONE-ACETAMINOPHEN 5-325 MG PO TABS: 1.0000 | ORAL_TABLET | ORAL | Status: DC | PRN

## 2012-10-04 NOTE — ED Notes (Signed)
Patient transported to X-ray 
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
## 2012-10-04 NOTE — ED Notes (Signed)
Backboard removed.

## 2012-10-04 NOTE — ED Notes (Signed)
Pt discharged to home with family. NAD.  
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
## 2014-07-13 ENCOUNTER — Emergency Department (INDEPENDENT_AMBULATORY_CARE_PROVIDER_SITE_OTHER)
Admission: EM | Admit: 2014-07-13 | Discharge: 2014-07-13 | Disposition: A | Discharge status: Home / Self Care | Payer: Self-pay | Source: Home / Self Care | Attending: Family Medicine | Admitting: Family Medicine

## 2014-07-13 ENCOUNTER — Ambulatory Visit (HOSPITAL_COMMUNITY): Payer: No Typology Code available for payment source | Attending: Family Medicine

## 2014-07-13 ENCOUNTER — Encounter (HOSPITAL_COMMUNITY): Payer: Self-pay | Admitting: Emergency Medicine

## 2014-07-13 DIAGNOSIS — J40 Bronchitis, not specified as acute or chronic: Secondary | ICD-10-CM

## 2014-07-13 DIAGNOSIS — R05 Cough: Secondary | ICD-10-CM | POA: Insufficient documentation

## 2014-07-13 DIAGNOSIS — R0602 Shortness of breath: Secondary | ICD-10-CM | POA: Insufficient documentation

## 2014-07-13 DIAGNOSIS — R509 Fever, unspecified: Secondary | ICD-10-CM | POA: Insufficient documentation

## 2014-07-13 MED ORDER — IPRATROPIUM-ALBUTEROL 0.5-2.5 (3) MG/3ML IN SOLN
RESPIRATORY_TRACT | Status: AC
  Filled 2014-07-13: qty 3

## 2014-07-13 MED ORDER — DOXYCYCLINE HYCLATE 100 MG PO CAPS: 100.0000 mg | ORAL_CAPSULE | Freq: Two times a day (BID) | ORAL | Status: DC

## 2014-07-13 MED ORDER — PREDNISONE 10 MG PO TABS: 30.0000 mg | ORAL_TABLET | Freq: Every day | ORAL | Status: DC

## 2014-07-13 MED ORDER — HYDROCODONE-ACETAMINOPHEN 5-325 MG PO TABS: 0.5000 | ORAL_TABLET | Freq: Every evening | ORAL | Status: DC | PRN

## 2014-07-13 MED ORDER — IPRATROPIUM-ALBUTEROL 0.5-2.5 (3) MG/3ML IN SOLN
3.0000 mL | Freq: Once | RESPIRATORY_TRACT | Status: AC
  Administered 2014-07-13: 3 mL via RESPIRATORY_TRACT

## 2014-07-13 NOTE — Discharge Instructions (Signed)
Thank you for coming in today. Continue albuterol Take prednisone and doxycycline Use hydrocodone cough medicine as needed  Call or go to the emergency room if you get worse, have trouble breathing, have chest pains, or palpitations.   Acute Bronchitis Bronchitis is inflammation of the airways that extend from the windpipe into the lungs (bronchi). The inflammation often causes mucus to develop. This leads to a cough, which is the most common symptom of bronchitis.  In acute bronchitis, the condition usually develops suddenly and goes away over time, usually in a couple weeks. Smoking, allergies, and asthma can make bronchitis worse. Repeated episodes of bronchitis may cause further lung problems.  CAUSES Acute bronchitis is most often caused by the same virus that causes a cold. The virus can spread from person to person (contagious) through coughing, sneezing, and touching contaminated objects. SIGNS AND SYMPTOMS   Cough.   Fever.   Coughing up mucus.   Body aches.   Chest congestion.   Chills.   Shortness of breath.   Sore throat.  DIAGNOSIS  Acute bronchitis is usually diagnosed through a physical exam. Your health care provider will also ask you questions about your medical history. Tests, such as chest X-rays, are sometimes done to rule out other conditions.  TREATMENT  Acute bronchitis usually goes away in a couple weeks. Oftentimes, no medical treatment is necessary. Medicines are sometimes given for relief of fever or cough. Antibiotic medicines are usually not needed but may be prescribed in certain situations. In some cases, an inhaler may be recommended to help reduce shortness of breath and control the cough. A cool mist vaporizer may also be used to help thin bronchial secretions and make it easier to clear the chest.  HOME CARE INSTRUCTIONS  Get plenty of rest.   Drink enough fluids to keep your urine clear or pale yellow (unless you have a medical  condition that requires fluid restriction). Increasing fluids may help thin your respiratory secretions (sputum) and reduce chest congestion, and it will prevent dehydration.   Take medicines only as directed by your health care provider.  If you were prescribed an antibiotic medicine, finish it all even if you start to feel better.  Avoid smoking and secondhand smoke. Exposure to cigarette smoke or irritating chemicals will make bronchitis worse. If you are a smoker, consider using nicotine gum or skin patches to help control withdrawal symptoms. Quitting smoking will help your lungs heal faster.   Reduce the chances of another bout of acute bronchitis by washing your hands frequently, avoiding people with cold symptoms, and trying not to touch your hands to your mouth, nose, or eyes.   Keep all follow-up visits as directed by your health care provider.  SEEK MEDICAL CARE IF: Your symptoms do not improve after 1 week of treatment.  SEEK IMMEDIATE MEDICAL CARE IF:  You develop an increased fever or chills.   You have chest pain.   You have severe shortness of breath.  You have bloody sputum.   You develop dehydration.  You faint or repeatedly feel like you are going to pass out.  You develop repeated vomiting.  You develop a severe headache. MAKE SURE YOU:   Understand these instructions.  Will watch your condition.  Will get help right away if you are not doing well or get worse. Document Released: 09/21/2004 Document Revised: 12/29/2013 Document Reviewed: 02/04/2013 [facility] Patient Information 2015 [place], [place]. This information is not intended to replace advice given to you by your  health care provider. Make sure you discuss any questions you have with your health care provider. ° °

## 2014-07-13 NOTE — ED Notes (Signed)
Patient concerned for pneumonia

## 2014-07-13 NOTE — ED Notes (Signed)
When asking patient how she was feeling, reported have chest discomfort through to back.  Notified dr Georgina Snell.  Patient to have ekg prior to going to radiology

## 2014-07-13 NOTE — ED Provider Notes (Signed)
Barbara Wang is a 48 y.o. female who presents to Urgent Care today for Bodyaches congestion wheezing shortness of breath. Symptoms present for the last several days. She has tried TheraFlu which helps some. Her current symptoms are similar to previous episodes of pneumonia. Patient notes subjective fevers. No significant chest pains or palpitations.   Past Medical History  Diagnosis Date  . Diabetes mellitus   . Hypertension   . Pancreatitis    Past Surgical History  Procedure Laterality Date  . Cesarean section      x2  . Tubal ligation    . Abdominal hysterectomy    . Rotator cuff repair    . Cholecystectomy  05/11/2012    Procedure: LAPAROSCOPIC CHOLECYSTECTOMY WITH INTRAOPERATIVE CHOLANGIOGRAM;  Surgeon: Gayland Curry, MD,FACS;  Location: [facility];  Service: General;  Laterality: N/A;   History  Substance Use Topics  . Smoking status: Current Every Day Smoker -- 0.50 packs/day  . Smokeless tobacco: Not on file  . Alcohol Use: No   ROS as above Medications: No current facility-administered medications for this encounter.   Current Outpatient Prescriptions  Medication Sig Dispense Refill  . amitriptyline (ELAVIL) 25 MG tablet Take 25 mg by mouth at bedtime.    Marland Kitchen aspirin EC 81 MG tablet Take 81 mg by mouth daily.    . clindamycin (CLEOCIN) 150 MG capsule Take 1 capsule (150 mg total) by mouth every 6 (six) hours. 28 capsule 0  . doxycycline (VIBRAMYCIN) 100 MG capsule Take 1 capsule (100 mg total) by mouth 2 (two) times daily. 20 capsule 0  . ferrous sulfate 325 (65 FE) MG tablet Take 325 mg by mouth daily with breakfast.    . HYDROcodone-acetaminophen (NORCO/VICODIN) 5-325 MG per tablet Take 0.5 tablets by mouth at bedtime as needed (cough). 6 tablet 0  . insulin aspart (NOVOLOG) 100 UNIT/ML injection Inject 2-10 Units into the skin 3 (three) times daily before meals. Per sliding scale    . insulin glargine (LANTUS) 100 UNIT/ML injection Inject 30 Units into the skin 2  (two) times daily.     Marland Kitchen lisinopril-hydrochlorothiazide (PRINZIDE,ZESTORETIC) 10-12.5 MG per tablet Take 1 tablet by mouth daily.    . naproxen (NAPROSYN) 500 MG tablet Take 1 tablet (500 mg total) by mouth 2 (two) times daily with a meal. 30 tablet 0  . oxyCODONE-acetaminophen (PERCOCET) 10-325 MG per tablet Take 1 tablet by mouth every 4 (four) hours as needed for pain. 16 tablet 0  . oxyCODONE-acetaminophen (PERCOCET) 5-325 MG per tablet Take 1 tablet by mouth every 4 (four) hours as needed for pain. 20 tablet 0  . polyethylene glycol powder (GLYCOLAX/MIRALAX) powder Take 8.5 g by mouth daily as needed. For constipation. Takes less than whole packet at times    . pravastatin (PRAVACHOL) 40 MG tablet Take 40 mg by mouth daily.    . predniSONE (DELTASONE) 10 MG tablet Take 3 tablets (30 mg total) by mouth daily. 15 tablet 0  . pyridOXINE (VITAMIN B-6) 50 MG tablet Take 50 mg by mouth daily.    Marland Kitchen terbinafine (LAMISIL) 250 MG tablet Take 250 mg by mouth daily. For 12 weeks starting on 09/16/12    . vitamin B-12 (CYANOCOBALAMIN) 250 MCG tablet Take 250 mcg by mouth daily.     Allergies  Allergen Reactions  . Azithromycin Hives  . Penicillins Hives  . Sulfa Antibiotics Hives  . Tramadol Hives     Exam:  BP 118/71 mmHg  Pulse 88  Temp(Src) 98.7 F (  37.1 C) (Oral)  Resp 16  SpO2 96% Gen: Well NAD HEENT: EOMI,  MMM Lungs: Normal work of breathing. CTABL Heart: RRR no MRG Abd: NABS, Soft. Nondistended, Nontender Exts: Brisk capillary refill, warm and well perfused.   Patient was given a 2.5/0.5 mg DuoNeb nebulizer treatment, and felt a little better  Twelve-lead EKG normal sinus rhythm at 98 bpm. No ST segment elevation or depression. Biphasic P waves leads II, and III.   No results found for this or any previous visit (from the past 24 hour(s)). Dg Chest 2 View  07/13/2014   CLINICAL DATA:  Short of breath, fever, productive cough  EXAM: CHEST  2 VIEW  COMPARISON:  Radiograph  07/15/2012  FINDINGS: Normal mediastinum and cardiac silhouette. Normal pulmonary vasculature. No evidence of effusion, infiltrate, or pneumothorax. No acute bony abnormality.  IMPRESSION: No acute cardiopulmonary process.   Electronically Signed   By: Suzy Bouchard M.D.   On: 07/13/2014 16:31    Assessment and Plan: 48 y.o. female with Bronchitis/viral URI. Treatment with prednisone and doxycycline and hydrocodone for cough suppression.  Discussed warning signs or symptoms. Please see discharge instructions. Patient expresses understanding.     Gregor Hams, MD 07/13/14 [phone]

## 2015-06-29 DIAGNOSIS — L0390 Cellulitis, unspecified: Secondary | ICD-10-CM

## 2015-06-29 HISTORY — DX: Cellulitis, unspecified: L03.90

## 2015-07-04 ENCOUNTER — Emergency Department (HOSPITAL_COMMUNITY): Payer: Self-pay

## 2015-07-04 ENCOUNTER — Inpatient Hospital Stay (HOSPITAL_COMMUNITY)
Admission: EM | Admit: 2015-07-04 | Discharge: 2015-07-06 | DRG: 603 | Disposition: A | Discharge status: Home / Self Care | Payer: Self-pay | Attending: Family Medicine | Admitting: Family Medicine

## 2015-07-04 DIAGNOSIS — B372 Candidiasis of skin and nail: Secondary | ICD-10-CM | POA: Diagnosis present

## 2015-07-04 DIAGNOSIS — Z79899 Other long term (current) drug therapy: Secondary | ICD-10-CM

## 2015-07-04 DIAGNOSIS — Z7982 Long term (current) use of aspirin: Secondary | ICD-10-CM

## 2015-07-04 DIAGNOSIS — E785 Hyperlipidemia, unspecified: Secondary | ICD-10-CM | POA: Diagnosis present

## 2015-07-04 DIAGNOSIS — Z888 Allergy status to other drugs, medicaments and biological substances status: Secondary | ICD-10-CM

## 2015-07-04 DIAGNOSIS — Z881 Allergy status to other antibiotic agents status: Secondary | ICD-10-CM

## 2015-07-04 DIAGNOSIS — Z8614 Personal history of Methicillin resistant Staphylococcus aureus infection: Secondary | ICD-10-CM

## 2015-07-04 DIAGNOSIS — Z880 Allergy status to penicillin: Secondary | ICD-10-CM

## 2015-07-04 DIAGNOSIS — I10 Essential (primary) hypertension: Secondary | ICD-10-CM | POA: Insufficient documentation

## 2015-07-04 DIAGNOSIS — E118 Type 2 diabetes mellitus with unspecified complications: Secondary | ICD-10-CM | POA: Insufficient documentation

## 2015-07-04 DIAGNOSIS — L0390 Cellulitis, unspecified: Secondary | ICD-10-CM | POA: Insufficient documentation

## 2015-07-04 DIAGNOSIS — Z882 Allergy status to sulfonamides status: Secondary | ICD-10-CM

## 2015-07-04 DIAGNOSIS — E1165 Type 2 diabetes mellitus with hyperglycemia: Secondary | ICD-10-CM | POA: Insufficient documentation

## 2015-07-04 DIAGNOSIS — L03119 Cellulitis of unspecified part of limb: Secondary | ICD-10-CM

## 2015-07-04 DIAGNOSIS — L02511 Cutaneous abscess of right hand: Principal | ICD-10-CM

## 2015-07-04 DIAGNOSIS — N179 Acute kidney failure, unspecified: Secondary | ICD-10-CM | POA: Diagnosis present

## 2015-07-04 DIAGNOSIS — L03011 Cellulitis of right finger: Secondary | ICD-10-CM | POA: Diagnosis present

## 2015-07-04 DIAGNOSIS — F17210 Nicotine dependence, cigarettes, uncomplicated: Secondary | ICD-10-CM | POA: Diagnosis present

## 2015-07-04 DIAGNOSIS — L02519 Cutaneous abscess of unspecified hand: Secondary | ICD-10-CM

## 2015-07-04 DIAGNOSIS — E1140 Type 2 diabetes mellitus with diabetic neuropathy, unspecified: Secondary | ICD-10-CM

## 2015-07-04 DIAGNOSIS — Z794 Long term (current) use of insulin: Secondary | ICD-10-CM

## 2015-07-04 DIAGNOSIS — Z23 Encounter for immunization: Secondary | ICD-10-CM

## 2015-07-04 DIAGNOSIS — Z79891 Long term (current) use of opiate analgesic: Secondary | ICD-10-CM

## 2015-07-04 HISTORY — DX: Personal history of other diseases of the digestive system: Z87.19

## 2015-07-04 HISTORY — DX: Unspecified asthma, uncomplicated: J45.909

## 2015-07-04 HISTORY — DX: Depression, unspecified: F32.A

## 2015-07-04 HISTORY — DX: Polyneuropathy, unspecified: G62.9

## 2015-07-04 HISTORY — DX: Cellulitis, unspecified: L03.90

## 2015-07-04 HISTORY — DX: Type 2 diabetes mellitus without complications: E11.9

## 2015-07-04 HISTORY — DX: Hyperlipidemia, unspecified: E78.5

## 2015-07-04 HISTORY — DX: Major depressive disorder, single episode, unspecified: F32.9

## 2015-07-04 LAB — GLUCOSE, CAPILLARY
Glucose-Capillary: 284 mg/dL — ABNORMAL HIGH (ref 65–99)
Glucose-Capillary: 419 mg/dL — ABNORMAL HIGH (ref 65–99)

## 2015-07-04 LAB — PROTIME-INR
INR: 1.1 (ref 0.00–1.49)
Prothrombin Time: 14.4 seconds (ref 11.6–15.2)

## 2015-07-04 LAB — I-STAT CHEM 8, ED
BUN: 26 mg/dL — ABNORMAL HIGH (ref 6–20)
Calcium, Ion: 1.16 mmol/L (ref 1.12–1.23)
Chloride: 100 mmol/L — ABNORMAL LOW (ref 101–111)
Creatinine, Ser: 1 mg/dL (ref 0.44–1.00)
Glucose, Bld: 280 mg/dL — ABNORMAL HIGH (ref 65–99)
HCT: 44 % (ref 36.0–46.0)
Hemoglobin: 15 g/dL (ref 12.0–15.0)
Potassium: 4.3 mmol/L (ref 3.5–5.1)
Sodium: 140 mmol/L (ref 135–145)
TCO2: 28 mmol/L (ref 0–100)

## 2015-07-04 LAB — CBC WITH DIFFERENTIAL/PLATELET
Basophils Absolute: 0 10*3/uL (ref 0.0–0.1)
Basophils Relative: 1 %
Eosinophils Absolute: 0.2 10*3/uL (ref 0.0–0.7)
Eosinophils Relative: 3 %
HCT: 41.9 % (ref 36.0–46.0)
Hemoglobin: 14.2 g/dL (ref 12.0–15.0)
Lymphocytes Relative: 24 %
Lymphs Abs: 2.1 10*3/uL (ref 0.7–4.0)
MCH: 29.2 pg (ref 26.0–34.0)
MCHC: 33.9 g/dL (ref 30.0–36.0)
MCV: 86.2 fL (ref 78.0–100.0)
Monocytes Absolute: 0.6 10*3/uL (ref 0.1–1.0)
Monocytes Relative: 7 %
Neutro Abs: 5.8 10*3/uL (ref 1.7–7.7)
Neutrophils Relative %: 65 %
Platelets: 361 10*3/uL (ref 150–400)
RBC: 4.86 MIL/uL (ref 3.87–5.11)
RDW: 12.9 % (ref 11.5–15.5)
WBC: 8.8 10*3/uL (ref 4.0–10.5)

## 2015-07-04 LAB — COMPREHENSIVE METABOLIC PANEL
ALT: 21 U/L (ref 14–54)
AST: 26 U/L (ref 15–41)
Albumin: 3.3 g/dL — ABNORMAL LOW (ref 3.5–5.0)
Alkaline Phosphatase: 96 U/L (ref 38–126)
Anion gap: 11 (ref 5–15)
BUN: 20 mg/dL (ref 6–20)
CO2: 24 mmol/L (ref 22–32)
Calcium: 8.8 mg/dL — ABNORMAL LOW (ref 8.9–10.3)
Chloride: 100 mmol/L — ABNORMAL LOW (ref 101–111)
Creatinine, Ser: 1.1 mg/dL — ABNORMAL HIGH (ref 0.44–1.00)
GFR calc Af Amer: >60 mL/min (ref >60)
GFR calc non Af Amer: 57 mL/min — ABNORMAL LOW (ref >60)
Glucose, Bld: 450 mg/dL — ABNORMAL HIGH (ref 65–99)
Potassium: 4.2 mmol/L (ref 3.5–5.1)
Sodium: 135 mmol/L (ref 135–145)
Total Bilirubin: 0.3 mg/dL (ref 0.3–1.2)
Total Protein: 6.9 g/dL (ref 6.5–8.1)

## 2015-07-04 MED ORDER — LIDOCAINE HCL 2 % IJ SOLN
10.0000 mL | Freq: Once | INTRAMUSCULAR | Status: DC
  Filled 2015-07-04: qty 20

## 2015-07-04 MED ORDER — SODIUM CHLORIDE 0.9 % IJ SOLN: 3.0000 mL | INTRAMUSCULAR | Status: DC | PRN

## 2015-07-04 MED ORDER — INSULIN ASPART 100 UNIT/ML ~~LOC~~ SOLN
5.0000 [IU] | Freq: Once | SUBCUTANEOUS | Status: AC
  Administered 2015-07-04: 5 [IU] via SUBCUTANEOUS

## 2015-07-04 MED ORDER — HEPARIN SODIUM (PORCINE) 5000 UNIT/ML IJ SOLN
5000.0000 [IU] | Freq: Three times a day (TID) | INTRAMUSCULAR | Status: DC
  Administered 2015-07-04 – 2015-07-06 (×4): 5000 [IU] via SUBCUTANEOUS
  Filled 2015-07-04 (×4): qty 1

## 2015-07-04 MED ORDER — ACETAMINOPHEN 325 MG PO TABS
650.0000 mg | ORAL_TABLET | Freq: Four times a day (QID) | ORAL | Status: DC | PRN
  Administered 2015-07-05 – 2015-07-06 (×3): 650 mg via ORAL
  Filled 2015-07-04 (×3): qty 2

## 2015-07-04 MED ORDER — CLINDAMYCIN PHOSPHATE 600 MG/50ML IV SOLN: 600.0000 mg | Freq: Three times a day (TID) | INTRAVENOUS | Status: DC

## 2015-07-04 MED ORDER — ACETAMINOPHEN 650 MG RE SUPP: 650.0000 mg | Freq: Four times a day (QID) | RECTAL | Status: DC | PRN

## 2015-07-04 MED ORDER — ONDANSETRON HCL 4 MG/2ML IJ SOLN
4.0000 mg | Freq: Once | INTRAMUSCULAR | Status: AC
  Administered 2015-07-04: 4 mg via INTRAVENOUS
  Filled 2015-07-04: qty 2

## 2015-07-04 MED ORDER — LISINOPRIL 10 MG PO TABS
10.0000 mg | ORAL_TABLET | Freq: Every day | ORAL | Status: DC
  Administered 2015-07-04 – 2015-07-06 (×3): 10 mg via ORAL
  Filled 2015-07-04 (×3): qty 1

## 2015-07-04 MED ORDER — HYDROCHLOROTHIAZIDE 25 MG PO TABS
25.0000 mg | ORAL_TABLET | Freq: Every day | ORAL | Status: DC
  Administered 2015-07-04 – 2015-07-06 (×3): 25 mg via ORAL
  Filled 2015-07-04 (×3): qty 1

## 2015-07-04 MED ORDER — ATORVASTATIN CALCIUM 40 MG PO TABS
40.0000 mg | ORAL_TABLET | Freq: Every day | ORAL | Status: DC
  Administered 2015-07-04 – 2015-07-06 (×3): 40 mg via ORAL
  Filled 2015-07-04 (×3): qty 1

## 2015-07-04 MED ORDER — INSULIN GLARGINE 100 UNIT/ML ~~LOC~~ SOLN
40.0000 [IU] | Freq: Every day | SUBCUTANEOUS | Status: DC
  Administered 2015-07-04 – 2015-07-06 (×3): 40 [IU] via SUBCUTANEOUS
  Filled 2015-07-04 (×4): qty 0.4

## 2015-07-04 MED ORDER — CLINDAMYCIN PHOSPHATE 600 MG/50ML IV SOLN
600.0000 mg | Freq: Three times a day (TID) | INTRAVENOUS | Status: DC
  Administered 2015-07-04 – 2015-07-06 (×6): 600 mg via INTRAVENOUS
  Filled 2015-07-04 (×8): qty 50

## 2015-07-04 MED ORDER — CLINDAMYCIN PHOSPHATE 600 MG/50ML IV SOLN
600.0000 mg | Freq: Once | INTRAVENOUS | Status: AC
  Administered 2015-07-04: 600 mg via INTRAVENOUS
  Filled 2015-07-04: qty 50

## 2015-07-04 MED ORDER — MORPHINE SULFATE (PF) 4 MG/ML IV SOLN
4.0000 mg | Freq: Once | INTRAVENOUS | Status: AC
  Administered 2015-07-04: 4 mg via INTRAVENOUS
  Filled 2015-07-04: qty 1

## 2015-07-04 MED ORDER — ASPIRIN EC 81 MG PO TBEC
81.0000 mg | DELAYED_RELEASE_TABLET | Freq: Every day | ORAL | Status: DC
  Administered 2015-07-04 – 2015-07-06 (×3): 81 mg via ORAL
  Filled 2015-07-04 (×3): qty 1

## 2015-07-04 MED ORDER — OXYCODONE HCL 5 MG PO TABS
5.0000 mg | ORAL_TABLET | ORAL | Status: DC | PRN
  Administered 2015-07-04 – 2015-07-06 (×9): 10 mg via ORAL
  Filled 2015-07-04 (×9): qty 2

## 2015-07-04 MED ORDER — SENNOSIDES-DOCUSATE SODIUM 8.6-50 MG PO TABS: 1.0000 | ORAL_TABLET | Freq: Every evening | ORAL | Status: DC | PRN

## 2015-07-04 MED ORDER — SODIUM CHLORIDE 0.9 % IV SOLN: 250.0000 mL | INTRAVENOUS | Status: DC | PRN

## 2015-07-04 MED ORDER — SODIUM CHLORIDE 0.9 % IJ SOLN
3.0000 mL | Freq: Two times a day (BID) | INTRAMUSCULAR | Status: DC
  Administered 2015-07-04: 3 mL via INTRAVENOUS

## 2015-07-04 MED ORDER — INSULIN ASPART 100 UNIT/ML ~~LOC~~ SOLN
0.0000 [IU] | Freq: Three times a day (TID) | SUBCUTANEOUS | Status: DC
  Administered 2015-07-04 – 2015-07-05 (×3): 8 [IU] via SUBCUTANEOUS
  Administered 2015-07-05: 5 [IU] via SUBCUTANEOUS
  Administered 2015-07-06 (×2): 8 [IU] via SUBCUTANEOUS

## 2015-07-04 NOTE — ED Notes (Signed)
Pt in from home c/o R middle finger pain, pt reports popping a bump on her R middle finger x 5 days ago, + swelling to R hand, limited ROM d/t pain, +PS, A&O x4

## 2015-07-04 NOTE — ED Provider Notes (Addendum)
CSN: [id number]     Arrival date & time 07/04/15  1054 History   First MD Initiated Contact with Patient 07/04/15 1210     Chief Complaint  Patient presents with  . Hand Pain     (Consider location/radiation/quality/duration/timing/severity/associated sxs/prior Treatment) HPI Comments: Patient is a 49 year old female who is right handed and has a history of diabetes which is insulin-dependent and hypertension presenting today with a five-day history of worsening pain swelling and redness to the right third finger and hand. On Tuesday patient noticed a small boil on her finger and was able to pop it. A moderate amount of pus came out and she put Neosporin on it and covered it with a Band-Aid. Later that day there was more swelling and a lot more pus drainage. Since that time she's continued to apply) and wash with soap and water however the drainage has stopped the finger has continued to swell and become more painful as well as swelling and redness now moving into the dorsal portion of her hand. She has severe pain when trying to extend the finger. She denies systemic symptoms at this time. She does have a prior history of MRSA but has never had infection on her hand   Patient is a 49 y.o. female presenting with hand pain. The history is provided by the patient.  Hand Pain This is a new problem. Episode onset: 5 days. The problem occurs constantly. The problem has been gradually worsening. Associated symptoms comments: Pain and swelling of the right 3rd finger.  . The symptoms are aggravated by bending. Nothing relieves the symptoms. She has tried a warm compress and rest for the symptoms. The treatment provided no relief.    Past Medical History  Diagnosis Date  . Diabetes mellitus   . Hypertension   . Pancreatitis    Past Surgical History  Procedure Laterality Date  . Cesarean section      x2  . Tubal ligation    . Abdominal hysterectomy    . Rotator cuff repair    . Cholecystectomy   05/11/2012    Procedure: LAPAROSCOPIC CHOLECYSTECTOMY WITH INTRAOPERATIVE CHOLANGIOGRAM;  Surgeon: Gayland Curry, MD,FACS;  Location: [facility];  Service: General;  Laterality: N/A;   No family history on file. Social History  Substance Use Topics  . Smoking status: Current Every Day Smoker -- 0.50 packs/day  . Smokeless tobacco: Not on file  . Alcohol Use: No   OB History    No data available     Review of Systems  All other systems reviewed and are negative.     Allergies  Penicillins; Azithromycin; Sulfa antibiotics; and Tramadol  Home Medications   Prior to Admission medications   Medication Sig Start Date End Date Taking? Authorizing Provider  amitriptyline (ELAVIL) 25 MG tablet Take 25 mg by mouth at bedtime.   Yes Historical Provider, MD  aspirin EC 81 MG tablet Take 81 mg by mouth daily.   Yes Historical Provider, MD  atorvastatin (LIPITOR) 40 MG tablet Take 40 mg by mouth daily.   Yes Historical Provider, MD  hydrochlorothiazide (HYDRODIURIL) 25 MG tablet Take 25 mg by mouth daily.   Yes Historical Provider, MD  insulin glargine (LANTUS) 100 UNIT/ML injection Inject 40 Units into the skin 2 (two) times daily.    Yes Historical Provider, MD  insulin regular (NOVOLIN R,HUMULIN R) 100 units/mL injection Inject 10-30 Units into the skin 3 (three) times daily before meals.   Yes Historical Provider, MD  lisinopril (PRINIVIL,ZESTRIL) 10 MG tablet Take 10 mg by mouth daily.   Yes Historical Provider, MD  lisinopril-hydrochlorothiazide (PRINZIDE,ZESTORETIC) 10-12.5 MG per tablet Take 1 tablet by mouth daily.   Yes Historical Provider, MD  vitamin B-12 (CYANOCOBALAMIN) 250 MCG tablet Take 250 mcg by mouth daily.   Yes Historical Provider, MD  clindamycin (CLEOCIN) 150 MG capsule Take 1 capsule (150 mg total) by mouth every 6 (six) hours. Patient not taking: Reported on 07/04/2015 10/01/12   Domenic Moras, PA-C  doxycycline (VIBRAMYCIN) 100 MG capsule Take 1 capsule (100 mg total) by mouth  2 (two) times daily. Patient not taking: Reported on 07/04/2015 07/13/14   Gregor Hams, MD  ferrous sulfate 325 (65 FE) MG tablet Take 325 mg by mouth daily as needed. Takes when her iron is low    Historical Provider, MD  HYDROcodone-acetaminophen (NORCO/VICODIN) 5-325 MG per tablet Take 0.5 tablets by mouth at bedtime as needed (cough). Patient not taking: Reported on 07/04/2015 07/13/14   Gregor Hams, MD  insulin aspart (NOVOLOG) 100 UNIT/ML injection Inject 2-10 Units into the skin 3 (three) times daily before meals. Per sliding scale    Historical Provider, MD  naproxen (NAPROSYN) 500 MG tablet Take 1 tablet (500 mg total) by mouth 2 (two) times daily with a meal. Patient not taking: Reported on 07/04/2015 10/04/12   Noemi Chapel, MD  oxyCODONE-acetaminophen (PERCOCET) 10-325 MG per tablet Take 1 tablet by mouth every 4 (four) hours as needed for pain. Patient not taking: Reported on 07/04/2015 10/01/12   Domenic Moras, PA-C  oxyCODONE-acetaminophen (PERCOCET) 5-325 MG per tablet Take 1 tablet by mouth every 4 (four) hours as needed for pain. Patient not taking: Reported on 07/04/2015 10/04/12   Noemi Chapel, MD  polyethylene glycol powder (GLYCOLAX/MIRALAX) powder Take 8.5 g by mouth daily as needed. For constipation. Takes less than whole packet at times    Historical Provider, MD  pravastatin (PRAVACHOL) 40 MG tablet Take 40 mg by mouth daily.    Historical Provider, MD  predniSONE (DELTASONE) 10 MG tablet Take 3 tablets (30 mg total) by mouth daily. Patient not taking: Reported on 07/04/2015 07/13/14   Gregor Hams, MD  pyridOXINE (VITAMIN B-6) 50 MG tablet Take 50 mg by mouth daily as needed. For tired spells    Historical Provider, MD  terbinafine (LAMISIL) 250 MG tablet Take 250 mg by mouth daily. For 12 weeks starting on 09/16/12    Historical Provider, MD   BP 118/75 mmHg  Pulse 114  Temp(Src) 98 F (36.7 C) (Oral)  Resp 16  Ht 5\' 4"  (1.626 m)  Wt 211 lb 4 oz (95.822 kg)  BMI 36.24 kg/m2   SpO2 94% Physical Exam  Constitutional: She is oriented to person, place, and time. She appears well-developed and well-nourished. No distress.  HENT:  Head: Normocephalic and atraumatic.  Mouth/Throat: Oropharynx is clear and moist.  Eyes: Conjunctivae and EOM are normal. Pupils are equal, round, and reactive to light.  Neck: Normal range of motion. Neck supple.  Cardiovascular: Regular rhythm and intact distal pulses.  Tachycardia present.   No murmur heard. Pulmonary/Chest: Effort normal and breath sounds normal. No respiratory distress. She has no wheezes. She has no rales.  Abdominal: Soft. She exhibits no distension. There is no tenderness. There is no rebound and no guarding.  Musculoskeletal: Normal range of motion. She exhibits tenderness. She exhibits no edema.       Hands: Neurological: She is alert and oriented to person, place,  and time.  Skin: Skin is warm and dry. No rash noted. No erythema.  Psychiatric: She has a normal mood and affect. Her behavior is normal.  Nursing note and vitals reviewed.   ED Course  Procedures (including critical care time) Labs Review Labs Reviewed  I-STAT CHEM 8, ED - Abnormal; Notable for the following:    Chloride 100 (*)    BUN 26 (*)    Glucose, Bld 280 (*)    All other components within normal limits  CBC WITH DIFFERENTIAL/PLATELET    Imaging Review Dg Hand Complete Right  07/04/2015  CLINICAL DATA:  Pain and swelling of the right hand. Possible insect bite 5 days ago. EXAM: RIGHT HAND - COMPLETE 3+ VIEW COMPARISON:  None. FINDINGS: There is soft tissue swelling of the entire hand and of the fingers and thumb. No visible foreign bodies. Arthritic changes between the scaphoid and trapezium. Degenerative changes in the capitate. IMPRESSION: Diffuse soft tissue swelling. Slight arthritic changes in the wrist. Electronically Signed   By: Lorriane Shire M.D.   On: 07/04/2015 13:36   I have personally reviewed and evaluated these images  and lab results as part of my medical decision-making.   EKG Interpretation None      MDM   Final diagnoses:  Cellulitis of hand excluding fingers  Abscess of finger, right    Patient is a 49 year old diabetic right-handed female who presents today with a five-day history of worsening pain swelling and redness of the hand. On the right third digit she has evidence of an abscess to the proximal finger with surrounding induration, fluctuance and redness spreading to the dorsal hand. She has flexion of the finger with severe pain when attempting to extend the finger. Concern for inspection status tenosynovitis as well as cellulitis and abscess. Patient denies any trauma prior to the symptoms starting. She does have a history of MRSA.  Patient given pain control and clindamycin. Will discuss with hand surgery. CBC, Chem-8 and x-ray pending  2:32 PM Labs with hyperglycemia but otherwise within normal limits. Dr. Burney Gauze saw the patient and this time does not feel that she needs to go to the OR but will do 24 hours of IV antibiotics to ensure improvement before going home.  Blanchie Dessert, MD 07/04/15 1432  Blanchie Dessert, MD 07/04/15 1451

## 2015-07-04 NOTE — H&P (Signed)
[facility] Admission History and Physical Service Pager: [phone]  Patient name: Barbara Wang Medical record number: [id number] Date of birth: [birth year] Age: 49 y.o. Gender: female  Primary Care Provider: Default, Provider, MD Consultants: Hand surgery  Code Status: Full  Chief Complaint: Right third digit pain and cellulitis  Assessment and Plan: Barbara Wang is a 49 y.o. female from [place] presenting with right third digit pain/cellulitis . PMH is significant for poorly controlled type 2 diabetes, hypertension, h/o pancreatitis, s/p cholecystectomy.  # Skin lesion/cellulitis; right UE, third digit, proximal phalanx, dorsal surface: Patient states that skin lesion was first noticed Tuesday morning (11/1). No history of trauma or injury to this hand. No prior history of skin ulcerations or poorly healing wounds. Initial lesion of unknown etiology. Most likely cause is a bacterial infection of a previously unrecognized skin lesion. X-ray showed no bony involvement. Patient has signs/symptoms concerning for infectious tenosynovitis with passive flexion of the affected digit, pain with passive extension, pain along the length of the tendon sheath, and digit edema. DDx includes a scenario much less likely: the acute nature of symptom development as well as described bulla formation is concerning for possible spider bite. Although unlikely and no known history of bite, brown recluse bites can sometimes go unnoticed until skin lesion surfaces. - Admit to MedSurg; attending physician Dr. Madison Hickman. - Consult hand surgery; appreciate the recommendations. - IV clindamycin 600 mg 3 times a day (allergy to penicillin) - Obtain blood cultures - Obtain PT/INR (due to possible spider-bite) - [place] Rt 3rd digit: r/o tenosynovitis (due to concern for infectious process) - Consideration for further imaging: MRI - AM BMP/CBC  # Type 2  diabetes, insulin-dependent: - A1c pending - Continue home aspirin 81 mg - SSI moderate scale - Lantus 40 units daily (home dose: 40 units twice a day)  # Hypertension - Continue home lisinopril and hydrochlorothiazide  # Hyperlipidemia - Continue home atorvastatin  FEN/GI: KVO IVF; heart healthy/carb modified diet >> NPO after breakfast 2/2 hand surgery requests Prophylaxis: SQ hep  Disposition: Pending medical improvement.  History of Present Illness:  Barbara Wang is a 49 y.o. female from [place] presenting with right third digit pain/cellulitis. Patient states that her pain began approximately 5 days ago. She states that she woke up on Tuesday of this week with a small lesion on the dorsal aspect of the third digit of her right hand. This lesion was firm, and had a small white central head. She states that she proceeded to pop this central head, expressing a small amount of purulent fluid. She then placed a Band-Aid over it. Later that day she noticed that the lesion now had a larger blister forming over it. This blister was then popped and a clear fluid was expressed. Over the next few days the pain in this area gradually worsened. Pain which started over this lesion then began to incorporate the associated MCP joint. It has since then progressed proximally beyond her wrist to her posterior forearm.   Patient denies any other signs or symptoms at this time. She states that she did not have any lesion, sore, wound, or scab located at the site of the current skin lesion prior to waking up Tuesday morning. She denies any trauma to this region. She denies any fever, chills, nausea, or vomiting. She states that, to her knowledge, she has not been exposed to any insects or noticed any spiders in her home.  Patient also denies any prior history of upper or lower extremity ulcerations or poorly healing skin lesions.  Patient is a poorly controlled type II diabetic. She reports that  her most recent A1c was "11 something". She has not taken some of her medications recently due to running out of them. She endorses diabetic neuropathy in her hands and feet, but denies any recent symptoms of hypo-/hyperglycemia.    Review Of Systems: Per HPI with the following additions: Denies diaphoresis, vision changes, headache, chest pain, shortness of breath, vision changes, abdominal pain, urinary frequency, dysuria. Otherwise the remainder of the systems were negative.  Patient Active Problem List   Diagnosis Date Noted  . Cellulitis of hand excluding fingers 07/04/2015  . Cellulitis and abscess of hand 07/04/2015  . Abscess of finger   . Type 2 diabetes mellitus with diabetic neuropathy ([place])   . Abdominal pain, other specified site 05/09/2012  . Diabetes mellitus ([place]) 05/09/2012  . HTN (hypertension) 05/09/2012  . Hypokalemia 05/09/2012    Past Medical History: Past Medical History  Diagnosis Date  . Diabetes mellitus   . Hypertension   . Pancreatitis     Past Surgical History: Past Surgical History  Procedure Laterality Date  . Cesarean section      x2  . Tubal ligation    . Abdominal hysterectomy    . Rotator cuff repair    . Cholecystectomy  05/11/2012    Procedure: LAPAROSCOPIC CHOLECYSTECTOMY WITH INTRAOPERATIVE CHOLANGIOGRAM;  Surgeon: Gayland Curry, MD,FACS;  Location: [facility];  Service: General;  Laterality: N/A;    Social History: Social History  Substance Use Topics  . Smoking status: Current Every Day Smoker -- 0.50 packs/day  . Smokeless tobacco: Not on file  . Alcohol Use: No   Additional social history: Continues to smoke 3-5 cigarettes a day Please also refer to relevant sections of EMR.  Family History: Diabetes and hypertension in her parents.  Allergies and Medications: Allergies  Allergen Reactions  . Penicillins Hives and Shortness Of Breath    Has patient had a PCN reaction causing immediate rash, facial/tongue/throat swelling, SOB  or lightheadedness with hypotension: Yes Has patient had a PCN reaction causing severe rash involving mucus membranes or skin necrosis: No Has patient had a PCN reaction that required hospitalization No Has patient had a PCN reaction occurring within the last 10 years: No If all of the above answers are "NO", then may proceed with Cephalosporin use.  . Azithromycin Hives  . Sulfa Antibiotics Hives  . Tramadol Hives   No current facility-administered medications on file prior to encounter.   Current Outpatient Prescriptions on File Prior to Encounter  Medication Sig Dispense Refill  . amitriptyline (ELAVIL) 25 MG tablet Take 25 mg by mouth at bedtime.    Marland Kitchen aspirin EC 81 MG tablet Take 81 mg by mouth daily.    . insulin glargine (LANTUS) 100 UNIT/ML injection Inject 40 Units into the skin 2 (two) times daily.     Marland Kitchen lisinopril-hydrochlorothiazide (PRINZIDE,ZESTORETIC) 10-12.5 MG per tablet Take 1 tablet by mouth daily.    . vitamin B-12 (CYANOCOBALAMIN) 250 MCG tablet Take 250 mcg by mouth daily.    . ferrous sulfate 325 (65 FE) MG tablet Take 325 mg by mouth daily as needed. Takes when her iron is low    . HYDROcodone-acetaminophen (NORCO/VICODIN) 5-325 MG per tablet Take 0.5 tablets by mouth at bedtime as needed (cough). (Patient not taking: Reported on 07/04/2015) 6 tablet 0  . insulin aspart (  NOVOLOG) 100 UNIT/ML injection Inject 2-10 Units into the skin 3 (three) times daily before meals. Per sliding scale    . naproxen (NAPROSYN) 500 MG tablet Take 1 tablet (500 mg total) by mouth 2 (two) times daily with a meal. (Patient not taking: Reported on 07/04/2015) 30 tablet 0  . oxyCODONE-acetaminophen (PERCOCET) 10-325 MG per tablet Take 1 tablet by mouth every 4 (four) hours as needed for pain. (Patient not taking: Reported on 07/04/2015) 16 tablet 0  . oxyCODONE-acetaminophen (PERCOCET) 5-325 MG per tablet Take 1 tablet by mouth every 4 (four) hours as needed for pain. (Patient not taking:  Reported on 07/04/2015) 20 tablet 0  . polyethylene glycol powder (GLYCOLAX/MIRALAX) powder Take 8.5 g by mouth daily as needed. For constipation. Takes less than whole packet at times    . pravastatin (PRAVACHOL) 40 MG tablet Take 40 mg by mouth daily.    Marland Kitchen pyridOXINE (VITAMIN B-6) 50 MG tablet Take 50 mg by mouth daily as needed. For tired spells    . terbinafine (LAMISIL) 250 MG tablet Take 250 mg by mouth daily. For 12 weeks starting on 09/16/12      Objective: BP 138/86 mmHg  Pulse 118  Temp(Src) 98.4 F (36.9 C) (Oral)  Resp 18  Ht 5\' 4"  (1.626 m)  Wt 211 lb 4 oz (95.822 kg)  BMI 36.24 kg/m2  SpO2 98% Exam: General -- oriented x3, pleasant and cooperative. HEENT -- PERRLA. EOMI. Ears, nose and throat were benign. Neck -- supple; no bruits. Integument -- 1 x 1 cm superficial skin ulceration. Surrounding erythema and edema present. No evidence of current drainage. Overlying scab. No surrounding vesicles/pustules. No evidence of other skin lesions appreciated. Neg Nikolsky sign. Chest -- normal work of breathing. Some bilateral crackles appreciated. Cardiac -- RRR. No murmurs noted.  Abdomen -- soft, nontender. No masses palpable. CNS -- cranial nerves II through XII grossly intact. Sensation intact bilaterally and throughout. Extremeties -- extreme tenderness noted at the right upper extremity's proximal phalanx of the third digit. Tenderness extends to incorporate the second third and fourth MCP joints, majority of the dorsal hand, wrist, and extensor surface of the right forearm. ROM limited by pain with both finger flexion and extension. Edema present within the right hand and second/third/fourth digits. Peripheral pulses intact bilaterally.  Labs and Imaging: CBC BMET   Recent Labs Lab 07/04/15 1345 07/04/15 1354  WBC 8.8  --   HGB 14.2 15.0  HCT 41.9 44.0  PLT 361  --     Recent Labs Lab 07/04/15 1354  NA 140  K 4.3  CL 100*  BUN 26*  CREATININE 1.00  GLUCOSE  280*     A1c: Pending Blood cultures: Pending PT/INR: Pending HIV: Pending   Elberta Leatherwood, MD 07/04/2015, 6:05 PM PGY-2, [facility] pager: [phone], text pages welcome

## 2015-07-04 NOTE — ED Notes (Signed)
Report attempted 

## 2015-07-04 NOTE — Consult Note (Signed)
Reason for Consult:right long finger pain Referring Physician: [place]  Barbara Wang is an 49 y.o. female.  HPI: with h/o right long finger pain and swelling with small abscess that drained last week now with increased pain and swelling  Past Medical History  Diagnosis Date  . Diabetes mellitus   . Hypertension   . Pancreatitis     Past Surgical History  Procedure Laterality Date  . Cesarean section      x2  . Tubal ligation    . Abdominal hysterectomy    . Rotator cuff repair    . Cholecystectomy  05/11/2012    Procedure: LAPAROSCOPIC CHOLECYSTECTOMY WITH INTRAOPERATIVE CHOLANGIOGRAM;  Surgeon: Gayland Curry, MD,FACS;  Location: [facility];  Service: General;  Laterality: N/A;    No family history on file.  Social History:  reports that she has been smoking.  She does not have any smokeless tobacco history on file. She reports that she does not drink alcohol or use illicit drugs.  Allergies:  Allergies  Allergen Reactions  . Penicillins Hives and Shortness Of Breath    Has patient had a PCN reaction causing immediate rash, facial/tongue/throat swelling, SOB or lightheadedness with hypotension: Yes Has patient had a PCN reaction causing severe rash involving mucus membranes or skin necrosis: No Has patient had a PCN reaction that required hospitalization No Has patient had a PCN reaction occurring within the last 10 years: No If all of the above answers are "NO", then may proceed with Cephalosporin use.  . Azithromycin Hives  . Sulfa Antibiotics Hives  . Tramadol Hives    Medications:  Scheduled: . lidocaine  10 mL Other Once  .  morphine injection  4 mg Intravenous Once  . ondansetron  4 mg Intravenous Once    No results found for this or any previous visit (from the past 48 hour(s)).  Dg Hand Complete Right  07/04/2015  CLINICAL DATA:  Pain and swelling of the right hand. Possible insect bite 5 days ago. EXAM: RIGHT HAND - COMPLETE 3+ VIEW COMPARISON:  None.  FINDINGS: There is soft tissue swelling of the entire hand and of the fingers and thumb. No visible foreign bodies. Arthritic changes between the scaphoid and trapezium. Degenerative changes in the capitate. IMPRESSION: Diffuse soft tissue swelling. Slight arthritic changes in the wrist. Electronically Signed   By: Lorriane Shire M.D.   On: 07/04/2015 13:36    Review of Systems  All other systems reviewed and are negative.  Blood pressure 118/75, pulse 114, temperature 98 F (36.7 C), temperature source Oral, resp. rate 16, height 5\' 4"  (1.626 m), weight 95.822 kg (211 lb 4 oz), SpO2 94 %. Physical Exam  Constitutional: She is oriented to person, place, and time. She appears well-developed and well-nourished.  HENT:  Head: Normocephalic and atraumatic.  Cardiovascular: Normal rate.   Respiratory: Effort normal.  Musculoskeletal:       Right hand: She exhibits tenderness and swelling.  Mild pain and tenderness over right long proximal phalanx with negative kanavels signs  Neurological: She is alert and oriented to person, place, and time.  Skin: Skin is warm. There is erythema.  Psychiatric: She has a normal mood and affect. Her behavior is normal. Judgment and thought content normal.    Assessment/Plan: As above  No surgical intervention needed now  Would admit to medicine and will wait for WBC  Most likely will need 24 hours of IV abx followed by po times 1 week  Please make npo  after breakfast tomorrow in case surgery is needed  Gotti Alwin A 07/04/2015, 1:40 PM

## 2015-07-04 NOTE — ED Notes (Signed)
Patient transported to X-ray 

## 2015-07-05 ENCOUNTER — Encounter (HOSPITAL_COMMUNITY): Payer: Self-pay | Admitting: General Practice

## 2015-07-05 ENCOUNTER — Inpatient Hospital Stay (HOSPITAL_COMMUNITY): Payer: Self-pay

## 2015-07-05 DIAGNOSIS — L03011 Cellulitis of right finger: Secondary | ICD-10-CM

## 2015-07-05 DIAGNOSIS — L0390 Cellulitis, unspecified: Secondary | ICD-10-CM | POA: Insufficient documentation

## 2015-07-05 DIAGNOSIS — I10 Essential (primary) hypertension: Secondary | ICD-10-CM

## 2015-07-05 DIAGNOSIS — Z794 Long term (current) use of insulin: Secondary | ICD-10-CM

## 2015-07-05 LAB — BASIC METABOLIC PANEL
Anion gap: 9 (ref 5–15)
BUN: 26 mg/dL — ABNORMAL HIGH (ref 6–20)
CO2: 27 mmol/L (ref 22–32)
Calcium: 8.9 mg/dL (ref 8.9–10.3)
Chloride: 98 mmol/L — ABNORMAL LOW (ref 101–111)
Creatinine, Ser: 1.26 mg/dL — ABNORMAL HIGH (ref 0.44–1.00)
GFR calc Af Amer: 56 mL/min — ABNORMAL LOW (ref >60)
GFR calc non Af Amer: 48 mL/min — ABNORMAL LOW (ref >60)
Glucose, Bld: 353 mg/dL — ABNORMAL HIGH (ref 65–99)
Potassium: 4.2 mmol/L (ref 3.5–5.1)
Sodium: 134 mmol/L — ABNORMAL LOW (ref 135–145)

## 2015-07-05 LAB — GLUCOSE, CAPILLARY
Glucose-Capillary: 372 mg/dL — ABNORMAL HIGH (ref 65–99)
Glucose-Capillary: 293 mg/dL — ABNORMAL HIGH (ref 65–99)
Glucose-Capillary: 273 mg/dL — ABNORMAL HIGH (ref 65–99)
Glucose-Capillary: 226 mg/dL — ABNORMAL HIGH (ref 65–99)
Glucose-Capillary: 313 mg/dL — ABNORMAL HIGH (ref 65–99)

## 2015-07-05 LAB — CBC
HCT: 40.8 % (ref 36.0–46.0)
Hemoglobin: 13.4 g/dL (ref 12.0–15.0)
MCH: 28.8 pg (ref 26.0–34.0)
MCHC: 32.8 g/dL (ref 30.0–36.0)
MCV: 87.6 fL (ref 78.0–100.0)
Platelets: 374 10*3/uL (ref 150–400)
RBC: 4.66 MIL/uL (ref 3.87–5.11)
RDW: 13.3 % (ref 11.5–15.5)
WBC: 10.1 10*3/uL (ref 4.0–10.5)

## 2015-07-05 LAB — HIV ANTIBODY (ROUTINE TESTING W REFLEX): HIV Screen 4th Generation wRfx: NONREACTIVE

## 2015-07-05 MED ORDER — DEXTROSE-NACL 5-0.9 % IV SOLN
INTRAVENOUS | Status: DC
  Administered 2015-07-05 – 2015-07-06 (×2): via INTRAVENOUS

## 2015-07-05 MED ORDER — NYSTATIN 100000 UNIT/GM EX POWD
Freq: Three times a day (TID) | CUTANEOUS | Status: DC
  Administered 2015-07-05 – 2015-07-06 (×3): via TOPICAL
  Filled 2015-07-05: qty 15

## 2015-07-05 MED ORDER — INSULIN ASPART 100 UNIT/ML ~~LOC~~ SOLN
8.0000 [IU] | Freq: Once | SUBCUTANEOUS | Status: AC
  Administered 2015-07-05: 8 [IU] via SUBCUTANEOUS

## 2015-07-05 MED ORDER — TETANUS-DIPHTH-ACELL PERTUSSIS 5-2.5-18.5 LF-MCG/0.5 IM SUSP
0.5000 mL | Freq: Once | INTRAMUSCULAR | Status: AC
  Administered 2015-07-06: 0.5 mL via INTRAMUSCULAR
  Filled 2015-07-05 (×3): qty 0.5

## 2015-07-05 MED ORDER — NICOTINE 7 MG/24HR TD PT24
7.0000 mg | MEDICATED_PATCH | Freq: Every day | TRANSDERMAL | Status: DC
Start: 2015-07-05 — End: 2015-07-06
  Administered 2015-07-05 – 2015-07-06 (×2): 7 mg via TRANSDERMAL
  Filled 2015-07-05 (×2): qty 1

## 2015-07-05 NOTE — Progress Notes (Signed)
Patient examined at bedside this am  Still no signs of tendon sheath or joint involvement  Tenderness most likely from cellulitis although out of proportion to exam  No surgical intervention needed  Continue IV abx till wbc trending down   Will see in my office thursday

## 2015-07-05 NOTE — Progress Notes (Signed)
Received patient form float RN [employer].  Patient AOx4 and independent.  Pain level at 6/10 at right middle finger.  Administered nicotine patch as new order.

## 2015-07-05 NOTE — Care Management Note (Signed)
Case Management Note  Patient Details  Name: CRYSTALE GIANNATTASIO MRN: [id number] Date of Birth: 1965/12/30  Subjective/Objective:                    Action/Plan:  Provided [place] letter dated 07-05-15 to 07-12-15 , explained to patient , patient aware pain medication not covered . Patient voiced understanding.   Patient has PCP . [facility] information provided .   Expected Discharge Date:                  Expected Discharge Plan:  Home/Self Care  In-House Referral:     Discharge planning Services  CM Consult, [place] Program, Medication Assistance  Post Acute Care Choice:    Choice offered to:  Patient  DME Arranged:    DME Agency:     HH Arranged:    [place] Agency:     Status of Service:  In process, will continue to follow  Medicare Important Message Given:    Date Medicare IM Given:    Medicare IM give by:    Date Additional Medicare IM Given:    Additional Medicare Important Message give by:     If discussed at [place] of Stay Meetings, dates discussed:    Additional Comments:  Marilu Favre, RN 07/05/2015, 3:46 PM

## 2015-07-05 NOTE — Progress Notes (Addendum)
Inpatient Diabetes Program Recommendations  AACE/ADA: New Consensus Statement on Inpatient Glycemic Control (2015)  Target Ranges:  Prepandial:   less than 140 mg/dL      Peak postprandial:   less than 180 mg/dL (1-2 hours)      Critically ill patients:  140 - 180 mg/dL  Results for WHITNI, PASQUINI (MRN [id number]) as of 07/05/2015 08:24  Ref. Range 07/04/2015 17:01 07/04/2015 22:02 07/05/2015 02:16  Glucose-Capillary Latest Ref Range: 65-99 mg/dL 284 (H) 419 (H) 372 (H)   Review of Glycemic Control  Diabetes history: DM2 Outpatient Diabetes medications: Lantus 40 units BID, Novolin R 10-30 units TID with meals Current orders for Inpatient glycemic control: Lantus 40 units daily, Novolog 0-15 units TID with meals  Inpatient Diabetes Program Recommendations: Correction (SSI): Please consider ordering Novolog bedtime correction scale. Insulin - Meal Coverage: When diet is resumed, please consider ordering Novolog 6 units TID with meals for meal coverage. HgbA1C: A1C is in process.  07/05/15@14 :15-Spoke with patient over the phone about diabetes and home regimen for diabetes control. Patient reports that she is followed by her PCP for diabetes management and currently she takes Lantus 40 units BID and Novolin R 10-30 units TID with meals as an outpatient for diabetes control.  Patient reports that she ran out of Lantus about 5 days ago and she has not been able to afford to refill her Lantus. Inquired about whether patient has applied for any medication assistance with manufacture of Lantus and she states that she has applied but she is unable to produce tax forms to verify income and is therefore not eligible for the program. Patient reports that she has applied for medicaid several times but has been turned down each time. In talking with the patient, questions had to be repeated twice at times and patient reports that she is very sleepy from the pain medication she has received and prefers  that diabetes coordinator talk with tomorrow. Therefore, will talk with patient again on Tuesday 07/06/15. Since patient has no insurance and is having difficulty getting medications, will consult Case Management. Patient may need to be placed on more affordable insulin such as Novolin 70/30 as an outpatient.  Thanks, Barnie Alderman, RN, MSN, CDE Diabetes Coordinator Inpatient Diabetes Program [phone] (Team Pager from [place] to [place]) [phone] ([place] office) [phone] [facility] office) [phone] [facility] office)

## 2015-07-05 NOTE — Progress Notes (Signed)
Family Medicine Teaching Service Daily Progress Note Intern Pager: [phone]  Patient name: Barbara Wang Medical record number: [id number] Date of birth: 1965-12-11 Age: 49 y.o. Gender: female  Primary Care Provider: Default, Provider, MD Consultants: Hand surgery Code Status: Full  Pt Overview and Major Events to Date:  11/6: Admitted to [facility] for cellulitis and possible tenosynovitis.  Assessment and Plan: Barbara Wang is a 49 y.o. female from the [place] area presenting with right third digit pain/cellulitis . PMH is significant for poorly controlled type 2 diabetes, hypertension, h/o pancreatitis, s/p cholecystectomy.  Skin lesion/cellulitis; right UE, third digit, proximal phalanx, dorsal surface:  Initial lesion of unknown etiology. Most likely cause is a bacterial infection of a previously unrecognized skin lesion. X-ray showed no bony involvement. Patient has signs/symptoms concerning for infectious tenosynovitis with passive flexion of the affected digit, pain with passive extension, pain along the length of the tendon sheath, and digit edema. Afebrile overnight with WBC 10.1. - Hand surgery consult; appreciate the recommendations. Believe no surgical intervention needed at this time. Will need 24 hours of IV abx followed by PO abx x 1 week. Will follow-up with new recommendations today. - IV clindamycin 600 mg 3 times a day (allergy to penicillin) - Blood cultures pending - PT 14.4, INR 1.10 - [place] Rt 3rd digit: r/o tenosynovitis (due to concern for infectious process) - Consideration for further imaging: MRI  Candidal infection under bilateral breasts: Erythema and moisture present under breasts. - Nystatin powder  Type 2 diabetes, insulin-dependent: CBGs ranging from 284-419 since admission. - A1c pending - Continue home aspirin 81 mg - SSI moderate scale - Lantus 40 units daily (home dose: 40 units twice a day). Will follow CBGs today and increase Lantus  as needed.  - Diabetes coordinator following, recommend Novolog 6 units TID with meals.   Hypertension: BPs ranging from 111-151/89-98 since admission. - Continue home lisinopril and hydrochlorothiazide  Hyperlipidemia: Last lipid panel 04/2012: Chol 101, TG 109, HDL 34, LDL 45 - Continue home atorvastatin 40mg   FEN/GI: KVO IVF; heart healthy/carb modified diet >> NPO after breakfast 2/2 hand surgery requests Prophylaxis: SQ hep  Disposition: Continued hospitalization required, will await hand surgery recommendations today.  Subjective:  Pt states she is doing okay this morning. She feels like the swelling in her right hand has gotten worse since admission. She states the pain medications are helping, but they wear off before she can get another dose. Her hand pain is shooting and episodic.  Objective: Temp:  [98 F (36.7 C)-99.2 F (37.3 C)] 98.8 F (37.1 C) (11/07 0646) Pulse Rate:  [77-120] 77 (11/07 0646) Resp:  [16-18] 18 (11/07 0646) BP: (111-151)/(66-98) 121/66 mmHg (11/07 0646) SpO2:  [90 %-100 %] 97 % (11/07 0646) Weight:  [211 lb 4 oz (95.822 kg)] 211 lb 4 oz (95.822 kg) (11/06 1127) Physical Exam: General -- pleasant and cooperative, in NAD HEENT -- PERRLA. EOMI. MMM. Neck -- supple, normal ROM Chest -- normal work of breathing. Lungs clear to auscultation.  Cardiac -- RRR. No murmurs noted.  Abdomen -- soft, nontender. No masses palpable. CNS -- cranial nerves II through XII grossly intact. Sensation intact bilaterally and throughout. Extremeties -- extreme tenderness noted at the right upper extremity's proximal phalanx of the third digit. Tenderness over the second, third, and fourth MCP joints. Also having tenderness over the hand and wrist. ROM limited by pain with both finger flexion and extension. Edema present within the right hand and second/third/fourth digits. Peripheral  pulses intact bilaterally. Skin -- 1 x 1 cm superficial skin ulceration. Surrounding  erythema and edema present. No drainage noted. Overlying scab. No surrounding vesicles/pustules. No evidence of other skin lesions appreciated.   Laboratory:  Recent Labs Lab 07/04/15 1345 07/04/15 1354 07/05/15 0407  WBC 8.8  --  10.1  HGB 14.2 15.0 13.4  HCT 41.9 44.0 40.8  PLT 361  --  374    Recent Labs Lab 07/04/15 1354 07/04/15 1926 07/05/15 0407  NA 140 135 134*  K 4.3 4.2 4.2  CL 100* 100* 98*  CO2  --  24 27  BUN 26* 20 26*  CREATININE 1.00 1.10* 1.26*  CALCIUM  --  8.8* 8.9  PROT  --  6.9  --   BILITOT  --  0.3  --   ALKPHOS  --  96  --   ALT  --  21  --   AST  --  26  --   GLUCOSE 280* 450* 353*   A1c: pending Blood cultures: pending PT 14.4, INR 1.10 HIV: pending  Imaging/Diagnostic Tests: X-ray R hand (11/6): diffuse soft tissue swelling, slight arthritic changes in the wrist.  Sela Hua, MD 07/05/2015, 8:33 AM PGY-1, [place] Intern pager: [phone], text pages welcome

## 2015-07-06 ENCOUNTER — Encounter (HOSPITAL_COMMUNITY): Payer: Self-pay | Admitting: General Practice

## 2015-07-06 LAB — HEMOGLOBIN A1C
Hgb A1c MFr Bld: 9.8 % — ABNORMAL HIGH (ref 4.8–5.6)
Mean Plasma Glucose: 235 mg/dL

## 2015-07-06 LAB — BASIC METABOLIC PANEL WITH GFR
Anion gap: 8 (ref 5–15)
BUN: 20 mg/dL (ref 6–20)
CO2: 26 mmol/L (ref 22–32)
Calcium: 8.9 mg/dL (ref 8.9–10.3)
Chloride: 100 mmol/L — ABNORMAL LOW (ref 101–111)
Creatinine, Ser: 1.03 mg/dL — ABNORMAL HIGH (ref 0.44–1.00)
GFR calc Af Amer: >60 mL/min
GFR calc non Af Amer: >60 mL/min
Glucose, Bld: 355 mg/dL — ABNORMAL HIGH (ref 65–99)
Potassium: 4 mmol/L (ref 3.5–5.1)
Sodium: 134 mmol/L — ABNORMAL LOW (ref 135–145)

## 2015-07-06 LAB — CBC
HCT: 37.9 % (ref 36.0–46.0)
Hemoglobin: 12.6 g/dL (ref 12.0–15.0)
MCH: 28.8 pg (ref 26.0–34.0)
MCHC: 33.2 g/dL (ref 30.0–36.0)
MCV: 86.5 fL (ref 78.0–100.0)
Platelets: 327 K/uL (ref 150–400)
RBC: 4.38 MIL/uL (ref 3.87–5.11)
RDW: 13 % (ref 11.5–15.5)
WBC: 8.1 K/uL (ref 4.0–10.5)

## 2015-07-06 LAB — GLUCOSE, CAPILLARY
Glucose-Capillary: 300 mg/dL — ABNORMAL HIGH (ref 65–99)
Glucose-Capillary: 297 mg/dL — ABNORMAL HIGH (ref 65–99)

## 2015-07-06 MED ORDER — INSULIN ASPART 100 UNIT/ML ~~LOC~~ SOLN: 10.0000 [IU] | Freq: Three times a day (TID) | SUBCUTANEOUS | Status: DC

## 2015-07-06 MED ORDER — CLINDAMYCIN HCL 300 MG PO CAPS: 600.0000 mg | ORAL_CAPSULE | Freq: Three times a day (TID) | ORAL | Status: DC

## 2015-07-06 MED ORDER — NYSTATIN 100000 UNIT/GM EX POWD: CUTANEOUS | Status: DC

## 2015-07-06 MED ORDER — INSULIN GLARGINE 100 UNIT/ML ~~LOC~~ SOLN: 50.0000 [IU] | Freq: Every day | SUBCUTANEOUS | Status: DC

## 2015-07-06 NOTE — Progress Notes (Signed)
MD contacted about clarification of IV fluid. Telephone order to [facility] fluid for now and MD stated they will address order.

## 2015-07-06 NOTE — Discharge Instructions (Signed)
You were hospitalized for an infection of your finger. We gave you IV antibiotics in the hospital. We switched you to the pill form of that antibiotic (Clindamycin) when you were discharged. Please take this antibiotic three times a day for 7 days.  Please follow-up in the Hand Surgeon's office on Thursday.  We have changed your insulin regimen. Please take Lantus 50 units daily. Please take Novolog 10 units three times a day with meals. We recommend that you follow with your doctor closely so that we can adjust your insulin as needed to keep your blood sugars under control.

## 2015-07-06 NOTE — Progress Notes (Signed)
Family Medicine Teaching Service Daily Progress Note Intern Pager: [phone]  Patient name: Barbara Wang Medical record number: [id number] Date of birth: 12-23-1965 Age: 49 y.o. Gender: female  Primary Care Provider: Default, Provider, MD Consultants: Hand surgery Code Status: Full  Pt Overview and Major Events to Date:  11/6: Admitted to [facility] for cellulitis and possible tenosynovitis.  Assessment and Plan: Barbara Wang is a 49 y.o. female from the [place] area presenting with right third digit pain/cellulitis . PMH is significant for poorly controlled type 2 diabetes, hypertension, h/o pancreatitis, s/p cholecystectomy.  Skin lesion/cellulitis; right UE, third digit, proximal phalanx, dorsal surface:  Initial lesion of unknown etiology. Most likely cause is a bacterial infection of a previously unrecognized skin lesion. X-ray showed no bony involvement. Afebrile overnight with WBC 8.1.  - U/S of right 3rd finger showing 1.6cm right third digit subq hypoechoic abnormality, concerning for underlying phlegmon or developing abscess. - Hand surgery consult; appreciate the recommendations. No signs of tendon sheath or joint involvement. No surgical intervention needed at this time. Follow-up in their clinic on Thursday. - IV clindamycin 600 mg 3 times a day (allergy to penicillin). Will transition to [place] today. - Blood cultures showing no growth x < 24 hours.  Candidal infection under bilateral breasts: Erythema and moisture present under breasts. - Nystatin powder tid  Mild AKI, resolved: Pt was noted to have a bump in Cr from 1.00 on admission to 1.26 on 11/7. Likely pre-renal, as BUN/Cr ratio was > 20:1 and Pt appeared dry on exam. - Hydrated yesterday with D5 NS at 160ml/hr - Cr improved this am to 1.08.  - Will continue to monitor.  Type 2 diabetes, insulin-dependent: CBGs ranging from 226-313 in the last 24 hours. Likely elevated in the setting of  infection. - A1c 9.8 - Continue home aspirin 81 mg - SSI moderate scale - Takes 40 units of Lantus bid at home and has been on Lantus 40 units daily during hospitalization. Has required 11 units of Novolog in the last 24 hours.  - Diabetes coordinator following, appreciate recommendations. Pt is having trouble affording Lantus and Novolog. Has been turned down multiple times by Medicaid. May need to be placed on more affordable insulin such as Novolin 70/30 as an outpatient.  Hypertension: BPs ranging from 113-116/63-75 since admission. - Continue home lisinopril and hydrochlorothiazide  Hyperlipidemia: Last lipid panel 04/2012: Chol 101, TG 109, HDL 34, LDL 45 - Continue home atorvastatin 40mg   FEN/GI: Will d/c IVFs today, heart healthy carb-modified diet. Prophylaxis: SQ hep  Disposition: Likely home today, with close follow-up on Thursday with Hand Surgery.  Subjective:  Pt states she is doing well this morning. She would like to go home today. She feels like the swelling of her right hand is unchanged from yesterday. She denies any fevers, chills, nausea, or vomiting.   Objective: Temp:  [98.4 F (36.9 C)-98.5 F (36.9 C)] 98.5 F (36.9 C) (11/08 0444) Pulse Rate:  [70-115] 70 (11/08 0444) Resp:  [18-19] 19 (11/08 0444) BP: (113-116)/(63-75) 116/75 mmHg (11/07 2114) SpO2:  [89 %-100 %] 89 % (11/08 0444) Weight:  [223 lb 1.6 oz (101.197 kg)] 223 lb 1.6 oz (101.197 kg) (11/07 2114) Physical Exam: General -- pleasant and cooperative, in NAD HEENT -- PERRLA. EOMI. MMM. Neck -- supple, normal ROM Chest -- normal work of breathing. Lungs clear to auscultation.  Cardiac -- RRR. No murmurs noted.  Abdomen -- soft, nontender. No masses palpable. CNS -- cranial nerves  II through XII grossly intact. Sensation intact bilaterally and throughout. Extremeties -- extreme tenderness noted at the right upper extremity's proximal phalanx of the third digit. Tenderness over the second, third,  and fourth MCP joints. Also having tenderness over the hand and wrist. ROM limited by pain with both finger flexion and extension. Edema present within the right hand and second/third/fourth digits. Peripheral pulses intact bilaterally. Skin -- 1 x 1 cm superficial skin ulceration. Surrounding erythema and edema present. No drainage noted. Overlying scab. No surrounding vesicles/pustules. No evidence of other skin lesions appreciated.   Laboratory:  Recent Labs Lab 07/04/15 1345 07/04/15 1354 07/05/15 0407  WBC 8.8  --  10.1  HGB 14.2 15.0 13.4  HCT 41.9 44.0 40.8  PLT 361  --  374    Recent Labs Lab 07/04/15 1926 07/05/15 0407 07/06/15 0228  NA 135 134* 134*  K 4.2 4.2 4.0  CL 100* 98* 100*  CO2 24 27 26   BUN 20 26* 20  CREATININE 1.10* 1.26* 1.03*  CALCIUM 8.8* 8.9 8.9  PROT 6.9  --   --   BILITOT 0.3  --   --   ALKPHOS 96  --   --   ALT 21  --   --   AST 26  --   --   GLUCOSE 450* 353* 355*   A1c: pending Blood cultures: pending PT 14.4, INR 1.10 HIV: pending  Imaging/Diagnostic Tests: X-ray R hand (11/6): diffuse soft tissue swelling, slight arthritic changes in the wrist.  Sela Hua, MD 07/06/2015, 7:12 AM PGY-1, [place] Intern pager: [phone], text pages welcome

## 2015-07-06 NOTE — Progress Notes (Signed)
Interim Progress Note: We spoke with Dr. Burney Gauze, who is the hand surgeon that was consulted for recommendations on the management of Barbara Wang's cellulitis. We read him the ultrasound results and he does not believe that a surgery is warranted at this time. He would like to continue the current plan and have Ms. Kroeze follow-up with him in clinic on Thursday.  Hyman Bible, MD PGY-1

## 2015-07-06 NOTE — Progress Notes (Signed)
Patient received discharge instructions and education using teach back. Prescriptions given to patient and reviewed with handouts. All questions answered. Patient to discharge home.

## 2015-07-06 NOTE — Progress Notes (Addendum)
Inpatient Diabetes Program Recommendations  AACE/ADA: New Consensus Statement on Inpatient Glycemic Control (2015)  Target Ranges:  Prepandial:   less than 140 mg/dL      Peak postprandial:   less than 180 mg/dL (1-2 hours)      Critically ill patients:  140 - 180 mg/dL  Results for Barbara Wang, Barbara Wang (MRN [id number]) as of 07/06/2015 10:32  Ref. Range 07/05/2015 02:16 07/05/2015 08:54 07/05/2015 11:54 07/05/2015 17:07 07/05/2015 21:11 07/06/2015 08:06  Glucose-Capillary Latest Ref Range: 65-99 mg/dL 372 (H) 293 (H) 273 (H) 226 (H) 313 (H) 300 (H)   Review of Glycemic Control Diabetes history: DM2 Outpatient Diabetes medications: Lantus 40 units BID, Novolin R 10-30 units TID with meals Current orders for Inpatient glycemic control: Lantus 40 units daily, Novolog 0-15 units TID with meals   Inpatient Diabetes Program Recommendations: Insulin - Basal: Please consider increasing Lantus to 50 units daily (if increased as recommend, please order one time order for Lantus 10 units now since patient has already received Lantus 40 units this morning). Correction (SSI): Please consider adding Novolog bedtime correction scale. Insulin - Meal Coverage: Please consider ordering Novolog 6 units TID with meals for meal coverage (in addition to Novolog correction scale). HgbA1C: A1C 9.8% on 07/04/2015.  07/06/15@13 :57-Spoke with patient again regarding diabetes and importance of glycemic control. Patient reports that she has been followed by [facility] for about 3 years and that she has been denied Medicaid multiple times. Currently patient does not have any insurance and she is having difficulty getting her insulin because she can not afford to pay for the refill. Patient has bee provided with [facility] voucher by Case Management. Discussed the [place] program and explained that the [facility] program will only provide her to purchase a 30 day supply of prescribed medications for $3 each. Patient states that all she  needs is a 30 day supply of her medications and that will allow her to have the medications she needs until she can move back to [place]. Patient states that she is planning to move back to [place] within the next few weeks and once she gets back there she will be able to get everything she needs. In talking with the patient she stated that she has had diabetes since 1992 and that she feels depressed and does not feel like doing the things she needs to do to take care of herself. Patient reports that she has 2 grandchildren which are her motivation to improve her health.  Patient reports that she use to be on Elavil in the past but she has not taken it in years. Encouraged patient to talk with her doctor to let them know how she is feeling. Stressed importance of getting diabetes controlled and impact diabetes has on overall health. Patient verbalized understanding of information discussed and states that she has no further questions related to diabetes at this time.   Thanks, Barnie Alderman, RN, MSN, CDE Diabetes Coordinator Inpatient Diabetes Program [phone] (Team Pager from [place] to [place]) [phone] ([place] office) [phone] [facility] office) [phone] [facility] office)

## 2015-07-09 LAB — CULTURE, BLOOD (ROUTINE X 2)
Culture: NO GROWTH
Culture: NO GROWTH

## 2015-07-09 NOTE — Discharge Summary (Signed)
[facility] Discharge Summary  Patient name: Barbara Wang record number: [id number] Date of birth: 1966/07/07 Age: 49 y.o. Gender: female Date of Admission: 07/04/2015  Date of Discharge: 07/06/15 Admitting Physician: Zenia Resides, MD  Primary Care Provider: Default, Provider, MD Consultants: Hand Surgery  Indication for Hospitalization: Cellulitis of the right third digit  Discharge Diagnoses/Problem List:  Cellulitis and abscess of the right third finger HTN T2DM HLD Candidal skin infection under breasts  Disposition: Home with follow-up in Hand Surgery clinic  Discharge Condition: Stable  Discharge Exam:  General -- pleasant and cooperative, in NAD HEENT -- PERRLA. EOMI. MMM. Neck -- supple, normal ROM Chest -- normal work of breathing. Lungs clear to auscultation.  Cardiac -- RRR. No murmurs noted.  Abdomen -- soft, nontender. No masses palpable. CNS -- cranial nerves II through XII grossly intact. Sensation intact bilaterally and throughout. Extremeties -- extreme tenderness noted at the right upper extremity's proximal phalanx of the third digit. Tenderness over the second, third, and fourth MCP joints. Also having tenderness over the hand and wrist. ROM limited by pain with both finger flexion and extension. Edema present within the right hand and second/third/fourth digits. Peripheral pulses intact bilaterally. Skin -- 1 x 1 cm superficial skin ulceration. Surrounding erythema and edema present. No drainage noted. Overlying scab. No surrounding vesicles/pustules. No evidence of other skin lesions appreciated.   Brief Hospital Course:  Barbara Wang is a 49 year old female who presented with right third digit pain and cellulitis that began as a small vesicular lesion. She also had associated swelling of the second, third, and fourth digits as well as pain throughout her hand, wrist, and posterior forearm. X-ray of her  right hand showed diffuse soft tissue swelling. She was treated with IV Clindamycin 600mg  tid (she has an allergy to penicillin). Hand surgery was consulted and felt that no surgical intervention was warranted at that time. They felt that she would improve with 24 hours of IV antibiotics, followed by po antibiotics for one week. We performed an ultrasound of the right third finger, which showed a 1/6cm hypoechoic abnormality concerning for underlying phlegmon or developing abscess. Hand surgery felt that she could be safely discharged with follow-up in their clinic. Blood cultures drawn on admission showed no growth. She was transitioned to PO Clindamycin to complete a 10 day course of antibiotics.   During hospitalization, Pt noted a rash underneath both breasts that was consistent with a candidal infection. She was treated with Nystatin powder. She also had a mild AKI, as her Cr bumped from 1.00 on admission to 1.26 on 11/7. We thought this was likely pre-renal in etiology, as Pt appeared dry on exam and BUN/Cr ratio was >20:1. Her Cr trended back down to baseline after IV hydration.  Based on Barbara Wang's blood sugars in the hospital, we changed her insulin regimen to Lantus 50 units daily and Novolog 10 units three times a day with meals. We recommend that she follow-up closely with her doctor to adjust her insulin regimen as needed.  Issues for Follow Up:  1. Pt was discharged on the following insulin regimen: Lantus 50 units daily and Novolog 10 units three times a day. 2. Barbara Wang medication list included HCTZ, Lisinopril, and Lisinopril-HCTZ. We advised Pt to stop taking the HCTZ and Lisinopril, but continue to take the Lisinopril-HCTZ  Significant Procedures: None  Significant Labs and Imaging:   Recent Labs Lab 07/04/15 1345 07/04/15 1354 07/05/15 0407  07/06/15 0854  WBC 8.8  --  10.1 8.1  HGB 14.2 15.0 13.4 12.6  HCT 41.9 44.0 40.8 37.9  PLT 361  --  374 327    Recent  Labs Lab 07/04/15 1354 07/04/15 1926 07/05/15 0407 07/06/15 0228  NA 140 135 134* 134*  K 4.3 4.2 4.2 4.0  CL 100* 100* 98* 100*  CO2  --  24 27 26   GLUCOSE 280* 450* 353* 355*  BUN 26* 20 26* 20  CREATININE 1.00 1.10* 1.26* 1.03*  CALCIUM  --  8.8* 8.9 8.9  ALKPHOS  --  96  --   --   AST  --  26  --   --   ALT  --  21  --   --   ALBUMIN  --  3.3*  --   --    Blood cultures: no growth U/S right third finger (11/7): 1.6cm right third digit subcutaneous complex hypoechoic abnormality, concerning for underlying phlegmon or developing abscess  X-ray right hand (11/6): Diffuse soft tissue swelling. Slight arthritic changes in the wrist.  Results/Tests Pending at Time of Discharge: None  Discharge Medications:    Medication List    STOP taking these medications        hydrochlorothiazide 25 MG tablet  Commonly known as:  HYDRODIURIL     insulin regular 100 units/mL injection  Commonly known as:  NOVOLIN R,HUMULIN R     lisinopril 10 MG tablet  Commonly known as:  PRINIVIL,ZESTRIL      TAKE these medications        amitriptyline 25 MG tablet  Commonly known as:  ELAVIL  Take 25 mg by mouth at bedtime.     aspirin EC 81 MG tablet  Take 81 mg by mouth daily.     atorvastatin 40 MG tablet  Commonly known as:  LIPITOR  Take 40 mg by mouth daily.     clindamycin 300 MG capsule  Commonly known as:  CLEOCIN  Take 2 capsules (600 mg total) by mouth 3 (three) times daily.     ferrous sulfate 325 (65 FE) MG tablet  Take 325 mg by mouth daily as needed. Takes when her iron is low     HYDROcodone-acetaminophen 5-325 MG tablet  Commonly known as:  NORCO/VICODIN  Take 0.5 tablets by mouth at bedtime as needed (cough).     insulin aspart 100 UNIT/ML injection  Commonly known as:  novoLOG  Inject 10 Units into the skin 3 (three) times daily before meals.     insulin glargine 100 UNIT/ML injection  Commonly known as:  LANTUS  Inject 0.5 mLs (50 Units total) into the  skin daily.     insulin glargine 100 UNIT/ML injection  Commonly known as:  LANTUS  Inject 0.5 mLs (50 Units total) into the skin daily.     lisinopril-hydrochlorothiazide 10-12.5 MG tablet  Commonly known as:  PRINZIDE,ZESTORETIC  Take 1 tablet by mouth daily.     naproxen 500 MG tablet  Commonly known as:  NAPROSYN  Take 1 tablet (500 mg total) by mouth 2 (two) times daily with a meal.     nystatin 100000 UNIT/GM Powd  Apply to affected area twice daily until rash goes away.     oxyCODONE-acetaminophen 10-325 MG tablet  Commonly known as:  PERCOCET  Take 1 tablet by mouth every 4 (four) hours as needed for pain.     oxyCODONE-acetaminophen 5-325 MG tablet  Commonly known as:  PERCOCET  Take  1 tablet by mouth every 4 (four) hours as needed for pain.     polyethylene glycol powder powder  Commonly known as:  GLYCOLAX/MIRALAX  Take 8.5 g by mouth daily as needed. For constipation. Takes less than whole packet at times     pravastatin 40 MG tablet  Commonly known as:  PRAVACHOL  Take 40 mg by mouth daily.     pyridOXINE 50 MG tablet  Commonly known as:  VITAMIN B-6  Take 50 mg by mouth daily as needed. For tired spells     terbinafine 250 MG tablet  Commonly known as:  LAMISIL  Take 250 mg by mouth daily. For 12 weeks starting on 09/16/12     vitamin B-12 250 MCG tablet  Commonly known as:  CYANOCOBALAMIN  Take 250 mcg by mouth daily.        Discharge Instructions: Please refer to Patient Instructions section of EMR for full details.  Patient was counseled important signs and symptoms that should prompt return to medical care, changes in medications, dietary instructions, activity restrictions, and follow up appointments.   Follow-Up Appointments:     Follow-up Information    Follow up with Schuyler Amor, MD.   Specialty:  Orthopedic Surgery   Why:  Please call ASAP to schedule appointment for Thursday.   Contact information:   [place]  [phone]       Sela Hua, MD 07/09/2015, 8:10 PM PGY-1, [place]

## 2016-01-15 ENCOUNTER — Ambulatory Visit (HOSPITAL_COMMUNITY)
Admission: EM | Admit: 2016-01-15 | Discharge: 2016-01-15 | Disposition: A | Discharge status: Home / Self Care | Payer: MEDICAID | Attending: Emergency Medicine | Admitting: Emergency Medicine

## 2016-01-15 ENCOUNTER — Encounter (HOSPITAL_COMMUNITY): Payer: Self-pay

## 2016-01-15 DIAGNOSIS — J302 Other seasonal allergic rhinitis: Secondary | ICD-10-CM

## 2016-01-15 DIAGNOSIS — N390 Urinary tract infection, site not specified: Secondary | ICD-10-CM

## 2016-01-15 DIAGNOSIS — I10 Essential (primary) hypertension: Secondary | ICD-10-CM

## 2016-01-15 LAB — POCT URINALYSIS DIP (DEVICE)
Glucose, UA: 100 mg/dL — AB
Hgb urine dipstick: NEGATIVE
Ketones, ur: NEGATIVE mg/dL
Nitrite: NEGATIVE
Protein, ur: 100 mg/dL — AB
Specific Gravity, Urine: 1.025 (ref 1.005–1.030)
Urobilinogen, UA: 0.2 mg/dL (ref 0.0–1.0)
pH: 6 (ref 5.0–8.0)

## 2016-01-15 LAB — POCT I-STAT, CHEM 8
BUN: 12 mg/dL (ref 6–20)
Calcium, Ion: 1.13 mmol/L (ref 1.12–1.23)
Chloride: 103 mmol/L (ref 101–111)
Creatinine, Ser: 0.7 mg/dL (ref 0.44–1.00)
Glucose, Bld: 184 mg/dL — ABNORMAL HIGH (ref 65–99)
HCT: 46 % (ref 36.0–46.0)
Hemoglobin: 15.6 g/dL — ABNORMAL HIGH (ref 12.0–15.0)
Potassium: 3.7 mmol/L (ref 3.5–5.1)
Sodium: 144 mmol/L (ref 135–145)
TCO2: 27 mmol/L (ref 0–100)

## 2016-01-15 MED ORDER — FLUCONAZOLE 150 MG PO TABS: 150.0000 mg | ORAL_TABLET | Freq: Every day | ORAL | Status: DC

## 2016-01-15 MED ORDER — LISINOPRIL-HYDROCHLOROTHIAZIDE 10-12.5 MG PO TABS: 1.0000 | ORAL_TABLET | Freq: Every day | ORAL | Status: DC

## 2016-01-15 MED ORDER — NITROFURANTOIN MONOHYD MACRO 100 MG PO CAPS: 100.0000 mg | ORAL_CAPSULE | Freq: Two times a day (BID) | ORAL | Status: DC

## 2016-01-15 NOTE — ED Notes (Signed)
Patient presents with elevated BP and UTI x3 days and has been hoarse x1 month, she has been taking Zyrtec and Claritin and has had no relief and would like to be checked out No acute distress

## 2016-01-15 NOTE — Discharge Instructions (Signed)
FOR UTI drink plenty of fluids. Take all of antibiotics.  BP meds are refilled x 1 only, f/u with PCP. Labs look ok.  Continue with current allergy medications and nasal spray daily.   Hypertension Hypertension is another name for high blood pressure. High blood pressure forces your heart to work harder to pump blood. A blood pressure reading has two numbers, which includes a higher number over a lower number (example: 110/72). HOME CARE   Have your blood pressure rechecked by your doctor.  Only take medicine as told by your doctor. Follow the directions carefully. The medicine does not work as well if you skip doses. Skipping doses also puts you at risk for problems.  Do not smoke.  Monitor your blood pressure at home as told by your doctor. GET HELP IF:  You think you are having a reaction to the medicine you are taking.  You have repeat headaches or feel dizzy.  You have puffiness (swelling) in your ankles.  You have trouble with your vision. GET HELP RIGHT AWAY IF:   You get a very bad headache and are confused.  You feel weak, numb, or faint.  You get chest or belly (abdominal) pain.  You throw up (vomit).  You cannot breathe very well. MAKE SURE YOU:   Understand these instructions.  Will watch your condition.  Will get help right away if you are not doing well or get worse.   This information is not intended to replace advice given to you by your health care provider. Make sure you discuss any questions you have with your health care provider.   Document Released: 01/31/2008 Document Revised: 08/19/2013 Document Reviewed: 06/06/2013 Elsevier Interactive Patient Education 2016 [place] Fever Hay fever is an allergic reaction to particles in the air. It cannot be passed from person to person. It cannot be cured, but it can be controlled. CAUSES  Hay fever is caused by something that triggers an allergic reaction (allergens). The following are  examples of allergens:  Ragweed.  Feathers.  Animal dander.  Grass and tree pollens.  Cigarette smoke.  House dust.  Pollution. SYMPTOMS   Sneezing.  Runny or stuffy nose.  Tearing eyes.  Itchy eyes, nose, mouth, throat, skin, or other area.  Sore throat.  Headache.  Decreased sense of smell or taste. DIAGNOSIS Your caregiver will perform a physical exam and ask questions about the symptoms you are having.Allergy testing may be done to determine exactly what triggers your hay fever.  TREATMENT   Over-the-counter medicines may help symptoms. These include:  Antihistamines.  Decongestants. These may help with nasal congestion.  Your caregiver may prescribe medicines if over-the-counter medicines do not work.  Some people benefit from allergy shots when other medicines are not helpful. HOME CARE INSTRUCTIONS   Avoid the allergen that is causing your symptoms, if possible.  Take all medicine as told by your caregiver. SEEK MEDICAL CARE IF:   You have severe allergy symptoms and your current medicines are not helping.  Your treatment was working at one time, but you are now experiencing symptoms.  You have sinus congestion and pressure.  You develop a fever or headache.  You have thick nasal discharge.  You have asthma and have a worsening cough and wheezing. SEEK IMMEDIATE MEDICAL CARE IF:   You have swelling of your tongue or lips.  You have trouble breathing.  You feel lightheaded or like you are going to faint.  You have cold sweats.  You have a fever.   This information is not intended to replace advice given to you by your health care provider. Make sure you discuss any questions you have with your health care provider.   Document Released: 08/14/2005 Document Revised: 11/06/2011 Document Reviewed: 02/24/2015 Elsevier Interactive Patient Education 2016 Elsevier Inc[employer]  Urinary Tract Infection A urinary tract infection (UTI) can occur  any place along the urinary tract. The tract includes the kidneys, ureters, bladder, and urethra. A type of germ called bacteria often causes a UTI. UTIs are often helped with antibiotic medicine.  HOME CARE   If given, take antibiotics as told by your doctor. Finish them even if you start to feel better.  Drink enough fluids to keep your pee (urine) clear or pale yellow.  Avoid tea, drinks with caffeine, and bubbly (carbonated) drinks.  Pee often. Avoid holding your pee in for a long time.  Pee before and after having sex (intercourse).  Wipe from front to back after you poop (bowel movement) if you are a woman. Use each tissue only once. GET HELP RIGHT AWAY IF:   You have back pain.  You have lower belly (abdominal) pain.  You have chills.  You feel sick to your stomach (nauseous).  You throw up (vomit).  Your burning or discomfort with peeing does not go away.  You have a fever.  Your symptoms are not better in 3 days. MAKE SURE YOU:   Understand these instructions.  Will watch your condition.  Will get help right away if you are not doing well or get worse.   This information is not intended to replace advice given to you by your health care provider. Make sure you discuss any questions you have with your health care provider.   Document Released: 01/31/2008 Document Revised: 09/04/2014 Document Reviewed: 03/14/2012 Elsevier Interactive Patient Education [employer].

## 2016-01-15 NOTE — ED Provider Notes (Signed)
CSN: [id number]     Arrival date & time 01/15/16  1433 History   First MD Initiated Contact with Patient 01/15/16 1518     Chief Complaint  Patient presents with  . Urinary Tract Infection  . Hypertension  . Hoarse   (Consider location/radiation/quality/duration/timing/severity/associated sxs/prior Treatment) HPI Comments: 50 yo black female presents for multiple reasons: 1-Need anti-hypertension medications refilled. She is here from [place] with her Mother and needs a refill until she goes back. Reports long term use.  2. Dysuria x 3 days. Some blood in urine. AZO without relief. No N, V, fevers.  3. Allergies-Horse ness. "happens every year". No sore throat.    Patient is a 50 y.o. female presenting with urinary tract infection and hypertension. The history is provided by the patient.  Urinary Tract Infection Associated symptoms: no fever and no flank pain   Hypertension Pertinent negatives include no headaches.    Past Medical History  Diagnosis Date  . Hypertension   . Pancreatitis   . Hyperlipemia   . History of hiatal hernia     "it went away on it's own"  . Depression   . Cellulitis 06/2015    rt hand   . Asthma   . Type II diabetes mellitus (HCC)     insulin dependent   . Neuropathy ([place])      "feet & hands "   Past Surgical History  Procedure Laterality Date  . Cesarean section  1981; 1987  . Shoulder arthroscopy w/ rotator cuff repair Right   . Cholecystectomy  05/11/2012    Procedure: LAPAROSCOPIC CHOLECYSTECTOMY WITH INTRAOPERATIVE CHOLANGIOGRAM;  Surgeon: Gayland Curry, MD,FACS;  Location: [facility];  Service: General;  Laterality: N/A;  . Abdominal hysterectomy    . Tubal ligation  10/25/1999    Archie Endo 01/11/2011   No family history on file. Social History  Substance Use Topics  . Smoking status: Current Every Day Smoker -- 0.50 packs/day for 40 years    Types: Cigarettes  . Smokeless tobacco: Never Used  . Alcohol Use: No   OB History    No data  available     Review of Systems  Constitutional: Negative for fever and chills.  HENT: Positive for congestion and postnasal drip. Negative for rhinorrhea and sore throat.   Respiratory: Negative for cough, chest tightness and wheezing.   Cardiovascular: Negative.   Genitourinary: Positive for dysuria. Negative for urgency, frequency, flank pain and pelvic pain.  Musculoskeletal: Negative for back pain.  Skin: Negative.   Allergic/Immunologic: Positive for environmental allergies.  Neurological: Negative for headaches.  Psychiatric/Behavioral: Negative.     Allergies  Penicillins; Azithromycin; Sulfa antibiotics; and Tramadol  Home Medications   Prior to Admission medications   Medication Sig Start Date End Date Taking? Authorizing Provider  atorvastatin (LIPITOR) 40 MG tablet Take 40 mg by mouth daily.   Yes Historical Provider, MD  insulin glargine (LANTUS) 100 UNIT/ML injection Inject 0.5 mLs (50 Units total) into the skin daily. 07/06/15  Yes Sela Hua, MD  insulin glargine (LANTUS) 100 UNIT/ML injection Inject 0.5 mLs (50 Units total) into the skin daily. 07/06/15  Yes Sela Hua, MD  amitriptyline (ELAVIL) 25 MG tablet Take 25 mg by mouth at bedtime.    Historical Provider, MD  aspirin EC 81 MG tablet Take 81 mg by mouth daily.    Historical Provider, MD  clindamycin (CLEOCIN) 300 MG capsule Take 2 capsules (600 mg total) by mouth 3 (three) times daily. 07/06/15  Sela Hua, MD  ferrous sulfate 325 (65 FE) MG tablet Take 325 mg by mouth daily as needed. Takes when her iron is low    Historical Provider, MD  fluconazole (DIFLUCAN) 150 MG tablet Take 1 tablet (150 mg total) by mouth daily. 01/15/16   Bjorn Pippin, PA-C  HYDROcodone-acetaminophen (NORCO/VICODIN) 5-325 MG per tablet Take 0.5 tablets by mouth at bedtime as needed (cough). Patient not taking: Reported on 07/04/2015 07/13/14   Gregor Hams, MD  insulin aspart (NOVOLOG) 100 UNIT/ML injection Inject 10  Units into the skin 3 (three) times daily before meals. 07/06/15   Sela Hua, MD  lisinopril-hydrochlorothiazide (PRINZIDE,ZESTORETIC) 10-12.5 MG tablet Take 1 tablet by mouth daily. 01/15/16   Bjorn Pippin, PA-C  naproxen (NAPROSYN) 500 MG tablet Take 1 tablet (500 mg total) by mouth 2 (two) times daily with a meal. Patient not taking: Reported on 07/04/2015 10/04/12   Noemi Chapel, MD  nitrofurantoin, macrocrystal-monohydrate, (MACROBID) 100 MG capsule Take 1 capsule (100 mg total) by mouth 2 (two) times daily. 01/15/16   Bjorn Pippin, PA-C  nystatin (MYCOSTATIN/NYSTOP) 100000 UNIT/GM POWD Apply to affected area twice daily until rash goes away. 07/06/15   Sela Hua, MD  oxyCODONE-acetaminophen (PERCOCET) 10-325 MG per tablet Take 1 tablet by mouth every 4 (four) hours as needed for pain. Patient not taking: Reported on 07/04/2015 10/01/12   Domenic Moras, PA-C  oxyCODONE-acetaminophen (PERCOCET) 5-325 MG per tablet Take 1 tablet by mouth every 4 (four) hours as needed for pain. Patient not taking: Reported on 07/04/2015 10/04/12   Noemi Chapel, MD  polyethylene glycol powder (GLYCOLAX/MIRALAX) powder Take 8.5 g by mouth daily as needed. For constipation. Takes less than whole packet at times    Historical Provider, MD  pravastatin (PRAVACHOL) 40 MG tablet Take 40 mg by mouth daily.    Historical Provider, MD  pyridOXINE (VITAMIN B-6) 50 MG tablet Take 50 mg by mouth daily as needed. For tired spells    Historical Provider, MD  terbinafine (LAMISIL) 250 MG tablet Take 250 mg by mouth daily. For 12 weeks starting on 09/16/12    Historical Provider, MD  vitamin B-12 (CYANOCOBALAMIN) 250 MCG tablet Take 250 mcg by mouth daily.    Historical Provider, MD   Meds Ordered and Administered this Visit  Medications - No data to display  BP 124/83 mmHg  Pulse 117  Temp(Src) 98.5 F (36.9 C) (Oral)  SpO2 100% No data found.   Physical Exam  Constitutional: She is oriented to person, place, and  time. She appears well-developed and well-nourished. No distress.  HENT:  Head: Normocephalic and atraumatic.  Mouth/Throat: Oropharynx is clear and moist. No oropharyngeal exudate.  Eyes: Right eye exhibits no discharge. Left eye exhibits no discharge.  Neck: Normal range of motion. Neck supple.  Cardiovascular: Normal rate and regular rhythm.   Pulmonary/Chest: Effort normal.  Abdominal:  No CVA tenderness  Lymphadenopathy:    She has no cervical adenopathy.  Neurological: She is alert and oriented to person, place, and time.  Skin: Skin is warm and dry. She is not diaphoretic.  Psychiatric: Her behavior is normal.  Nursing note and vitals reviewed.   ED Course  Procedures (including critical care time)  Labs Review Labs Reviewed  POCT URINALYSIS DIP (DEVICE) - Abnormal; Notable for the following:    Glucose, UA 100 (*)    Bilirubin Urine SMALL (*)    Protein, ur 100 (*)    Leukocytes, UA SMALL (*)  All other components within normal limits  POCT I-STAT, CHEM 8 - Abnormal; Notable for the following:    Glucose, Bld 184 (*)    Hemoglobin 15.6 (*)    All other components within normal limits    Imaging Review No results found.   Visual Acuity Review  Right Eye Distance:   Left Eye Distance:   Bilateral Distance:    Right Eye Near:   Left Eye Near:    Bilateral Near:         MDM   1. Essential hypertension, benign   2. Seasonal allergies   3. UTI (lower urinary tract infection)    1. ISTAT-normal o/t elevated glucose (184)- Ok to refill Meds x 1-Must f/u with PCP 2. Continue with Flonase/Zyrtec daily through pollen season.  3. Treat with Macrobid and Diflucan (patient requested) for empiric candida 4. FU PRN    Bjorn Pippin, PA-C 01/15/16 1636

## 2016-01-21 ENCOUNTER — Inpatient Hospital Stay (HOSPITAL_COMMUNITY)
Admission: EM | Admit: 2016-01-21 | Discharge: 2016-01-25 | DRG: 439 | Disposition: A | Discharge status: Home / Self Care | Payer: Medicaid - Out of State | Attending: Internal Medicine | Admitting: Internal Medicine

## 2016-01-21 ENCOUNTER — Encounter (HOSPITAL_COMMUNITY): Payer: Self-pay | Admitting: Emergency Medicine

## 2016-01-21 ENCOUNTER — Emergency Department (HOSPITAL_COMMUNITY): Payer: Medicaid - Out of State

## 2016-01-21 DIAGNOSIS — E876 Hypokalemia: Secondary | ICD-10-CM | POA: Diagnosis present

## 2016-01-21 DIAGNOSIS — I10 Essential (primary) hypertension: Secondary | ICD-10-CM | POA: Diagnosis present

## 2016-01-21 DIAGNOSIS — B3749 Other urogenital candidiasis: Secondary | ICD-10-CM | POA: Diagnosis present

## 2016-01-21 DIAGNOSIS — E785 Hyperlipidemia, unspecified: Secondary | ICD-10-CM | POA: Diagnosis present

## 2016-01-21 DIAGNOSIS — E669 Obesity, unspecified: Secondary | ICD-10-CM | POA: Diagnosis present

## 2016-01-21 DIAGNOSIS — Z794 Long term (current) use of insulin: Secondary | ICD-10-CM

## 2016-01-21 DIAGNOSIS — E1165 Type 2 diabetes mellitus with hyperglycemia: Secondary | ICD-10-CM | POA: Diagnosis present

## 2016-01-21 DIAGNOSIS — Z7982 Long term (current) use of aspirin: Secondary | ICD-10-CM

## 2016-01-21 DIAGNOSIS — F17210 Nicotine dependence, cigarettes, uncomplicated: Secondary | ICD-10-CM | POA: Diagnosis present

## 2016-01-21 DIAGNOSIS — K859 Acute pancreatitis without necrosis or infection, unspecified: Principal | ICD-10-CM | POA: Diagnosis present

## 2016-01-21 DIAGNOSIS — Z9049 Acquired absence of other specified parts of digestive tract: Secondary | ICD-10-CM

## 2016-01-21 DIAGNOSIS — Z6834 Body mass index (BMI) 34.0-34.9, adult: Secondary | ICD-10-CM

## 2016-01-21 HISTORY — DX: Obesity, unspecified: E66.9

## 2016-01-21 LAB — URINALYSIS, ROUTINE W REFLEX MICROSCOPIC
Glucose, UA: 500 mg/dL — AB
Hgb urine dipstick: NEGATIVE
Ketones, ur: 40 mg/dL — AB
Nitrite: NEGATIVE
Protein, ur: NEGATIVE mg/dL
Specific Gravity, Urine: 1.025 (ref 1.005–1.030)
pH: 5.5 (ref 5.0–8.0)

## 2016-01-21 LAB — CBC
HCT: 41.8 % (ref 36.0–46.0)
Hemoglobin: 14.3 g/dL (ref 12.0–15.0)
MCH: 29.4 pg (ref 26.0–34.0)
MCHC: 34.2 g/dL (ref 30.0–36.0)
MCV: 85.8 fL (ref 78.0–100.0)
Platelets: 374 10*3/uL (ref 150–400)
RBC: 4.87 MIL/uL (ref 3.87–5.11)
RDW: 12.5 % (ref 11.5–15.5)
WBC: 9 10*3/uL (ref 4.0–10.5)

## 2016-01-21 LAB — COMPREHENSIVE METABOLIC PANEL
ALT: 18 U/L (ref 14–54)
AST: 17 U/L (ref 15–41)
Albumin: 3.4 g/dL — ABNORMAL LOW (ref 3.5–5.0)
Alkaline Phosphatase: 92 U/L (ref 38–126)
Anion gap: 10 (ref 5–15)
BUN: 14 mg/dL (ref 6–20)
CO2: 28 mmol/L (ref 22–32)
Calcium: 9.6 mg/dL (ref 8.9–10.3)
Chloride: 100 mmol/L — ABNORMAL LOW (ref 101–111)
Creatinine, Ser: 0.81 mg/dL (ref 0.44–1.00)
GFR calc Af Amer: >60 mL/min (ref >60)
GFR calc non Af Amer: >60 mL/min (ref >60)
Glucose, Bld: 278 mg/dL — ABNORMAL HIGH (ref 65–99)
Potassium: 3.2 mmol/L — ABNORMAL LOW (ref 3.5–5.1)
Sodium: 138 mmol/L (ref 135–145)
Total Bilirubin: 0.7 mg/dL (ref 0.3–1.2)
Total Protein: 7.1 g/dL (ref 6.5–8.1)

## 2016-01-21 LAB — GLUCOSE, CAPILLARY
Glucose-Capillary: 239 mg/dL — ABNORMAL HIGH (ref 65–99)
Glucose-Capillary: 204 mg/dL — ABNORMAL HIGH (ref 65–99)

## 2016-01-21 LAB — LIPASE, BLOOD: Lipase: 613 U/L — ABNORMAL HIGH (ref 11–51)

## 2016-01-21 LAB — URINE MICROSCOPIC-ADD ON
Bacteria, UA: NONE SEEN
RBC / HPF: NONE SEEN RBC/hpf (ref 0–5)

## 2016-01-21 LAB — MAGNESIUM: Magnesium: 2 mg/dL (ref 1.7–2.4)

## 2016-01-21 MED ORDER — HYDROMORPHONE HCL 1 MG/ML IJ SOLN
0.5000 mg | INTRAMUSCULAR | Status: DC | PRN
  Administered 2016-01-21 – 2016-01-24 (×8): 0.5 mg via INTRAVENOUS
  Filled 2016-01-21 (×9): qty 1

## 2016-01-21 MED ORDER — ONDANSETRON HCL 4 MG/2ML IJ SOLN: 4.0000 mg | Freq: Four times a day (QID) | INTRAMUSCULAR | Status: DC | PRN

## 2016-01-21 MED ORDER — FLUCONAZOLE 100 MG PO TABS
150.0000 mg | ORAL_TABLET | Freq: Once | ORAL | Status: AC
  Administered 2016-01-21: 150 mg via ORAL
  Filled 2016-01-21: qty 2

## 2016-01-21 MED ORDER — HYDROMORPHONE HCL 1 MG/ML IJ SOLN
1.0000 mg | Freq: Once | INTRAMUSCULAR | Status: AC
  Administered 2016-01-21: 1 mg via INTRAVENOUS
  Filled 2016-01-21: qty 1

## 2016-01-21 MED ORDER — ACETAMINOPHEN 325 MG PO TABS: 650.0000 mg | ORAL_TABLET | Freq: Four times a day (QID) | ORAL | Status: DC | PRN

## 2016-01-21 MED ORDER — INSULIN ASPART 100 UNIT/ML ~~LOC~~ SOLN
0.0000 [IU] | Freq: Every day | SUBCUTANEOUS | Status: DC
  Administered 2016-01-21: 2 [IU] via SUBCUTANEOUS
  Administered 2016-01-24: 5 [IU] via SUBCUTANEOUS

## 2016-01-21 MED ORDER — HYDRALAZINE HCL 20 MG/ML IJ SOLN: 5.0000 mg | INTRAMUSCULAR | Status: DC | PRN

## 2016-01-21 MED ORDER — INSULIN GLARGINE 100 UNIT/ML ~~LOC~~ SOLN
25.0000 [IU] | Freq: Every day | SUBCUTANEOUS | Status: DC
  Administered 2016-01-21 – 2016-01-24 (×4): 25 [IU] via SUBCUTANEOUS
  Filled 2016-01-21 (×6): qty 0.25

## 2016-01-21 MED ORDER — SODIUM CHLORIDE 0.9 % IV SOLN
INTRAVENOUS | Status: AC
  Administered 2016-01-21 (×2): via INTRAVENOUS

## 2016-01-21 MED ORDER — DIPHENHYDRAMINE HCL 50 MG/ML IJ SOLN: 12.5000 mg | Freq: Four times a day (QID) | INTRAMUSCULAR | Status: DC | PRN

## 2016-01-21 MED ORDER — ACETAMINOPHEN 650 MG RE SUPP: 650.0000 mg | Freq: Four times a day (QID) | RECTAL | Status: DC | PRN

## 2016-01-21 MED ORDER — ENOXAPARIN SODIUM 40 MG/0.4ML ~~LOC~~ SOLN
40.0000 mg | SUBCUTANEOUS | Status: DC
  Administered 2016-01-21 – 2016-01-24 (×4): 40 mg via SUBCUTANEOUS
  Filled 2016-01-21 (×4): qty 0.4

## 2016-01-21 MED ORDER — SODIUM CHLORIDE 0.9 % IV BOLUS (SEPSIS)
1000.0000 mL | Freq: Once | INTRAVENOUS | Status: AC
  Administered 2016-01-21: 1000 mL via INTRAVENOUS

## 2016-01-21 MED ORDER — POTASSIUM CHLORIDE CRYS ER 20 MEQ PO TBCR
40.0000 meq | EXTENDED_RELEASE_TABLET | Freq: Two times a day (BID) | ORAL | Status: DC
Start: 2016-01-21 — End: 2016-01-25
  Administered 2016-01-21 – 2016-01-25 (×8): 40 meq via ORAL
  Filled 2016-01-21 (×8): qty 2

## 2016-01-21 MED ORDER — ONDANSETRON HCL 4 MG/2ML IJ SOLN
4.0000 mg | Freq: Once | INTRAMUSCULAR | Status: AC
  Administered 2016-01-21: 4 mg via INTRAVENOUS
  Filled 2016-01-21: qty 2

## 2016-01-21 MED ORDER — SODIUM CHLORIDE 0.9 % IV SOLN
INTRAVENOUS | Status: DC
  Administered 2016-01-21: 16:00:00 via INTRAVENOUS

## 2016-01-21 MED ORDER — KETOROLAC TROMETHAMINE 15 MG/ML IJ SOLN
15.0000 mg | Freq: Four times a day (QID) | INTRAMUSCULAR | Status: DC | PRN
  Administered 2016-01-21 – 2016-01-24 (×7): 15 mg via INTRAVENOUS
  Filled 2016-01-21 (×7): qty 1

## 2016-01-21 MED ORDER — MORPHINE SULFATE (PF) 2 MG/ML IV SOLN: 1.0000 mg | INTRAVENOUS | Status: DC | PRN

## 2016-01-21 MED ORDER — ONDANSETRON HCL 4 MG PO TABS: 4.0000 mg | ORAL_TABLET | Freq: Four times a day (QID) | ORAL | Status: DC | PRN

## 2016-01-21 MED ORDER — IOPAMIDOL (ISOVUE-300) INJECTION 61%
INTRAVENOUS | Status: AC
  Administered 2016-01-21: 100 mL
  Filled 2016-01-21: qty 100

## 2016-01-21 MED ORDER — TRAZODONE HCL 50 MG PO TABS
25.0000 mg | ORAL_TABLET | Freq: Every evening | ORAL | Status: DC | PRN
  Administered 2016-01-24 (×2): 25 mg via ORAL
  Filled 2016-01-21 (×2): qty 1

## 2016-01-21 MED ORDER — INSULIN ASPART 100 UNIT/ML ~~LOC~~ SOLN
0.0000 [IU] | Freq: Three times a day (TID) | SUBCUTANEOUS | Status: DC
  Administered 2016-01-21: 5 [IU] via SUBCUTANEOUS
  Administered 2016-01-22: 3 [IU] via SUBCUTANEOUS
  Administered 2016-01-22 – 2016-01-23 (×2): 5 [IU] via SUBCUTANEOUS
  Administered 2016-01-23: 11 [IU] via SUBCUTANEOUS
  Administered 2016-01-24: 3 [IU] via SUBCUTANEOUS
  Administered 2016-01-24: 8 [IU] via SUBCUTANEOUS
  Administered 2016-01-25: 3 [IU] via SUBCUTANEOUS
  Administered 2016-01-25: 15 [IU] via SUBCUTANEOUS

## 2016-01-21 MED ORDER — POTASSIUM CHLORIDE CRYS ER 20 MEQ PO TBCR
40.0000 meq | EXTENDED_RELEASE_TABLET | Freq: Once | ORAL | Status: AC
  Administered 2016-01-21: 40 meq via ORAL
  Filled 2016-01-21: qty 2

## 2016-01-21 MED ORDER — CETYLPYRIDINIUM CHLORIDE 0.05 % MT LIQD
7.0000 mL | Freq: Two times a day (BID) | OROMUCOSAL | Status: DC
  Administered 2016-01-21 – 2016-01-23 (×4): 7 mL via OROMUCOSAL

## 2016-01-21 MED ORDER — FENTANYL CITRATE (PF) 100 MCG/2ML IJ SOLN
50.0000 ug | Freq: Once | INTRAMUSCULAR | Status: AC
  Administered 2016-01-21: 50 ug via INTRAVENOUS
  Filled 2016-01-21: qty 2

## 2016-01-21 NOTE — ED Provider Notes (Signed)
CSN: [id number]     Arrival date & time 01/21/16  1003 History   First MD Initiated Contact with Patient 01/21/16 1123     Chief Complaint  Patient presents with  . Abdominal Pain  . Urinary Tract Infection    HPI  Barbara Wang is an 50 y.o. female with history of HTN, HLD, pancreatitis, DM who presents to the ED for evaluation of abdominal pain. She states she was seen at [facility] one week ago and was diagnosed with a UTI, treated with a course of Macrobid which she completed. She states she still has persistent dysuria and increased urinary frequency. She states that three days ago she started experiencing progressive epigastric pain. States the pain now radiates across her upper abdomen and radiates into her bilateral flanks. She does endorse associated nausea with intermittent NBNB emesis. She states she has had loose BM but this is normal for her. She is s/p cholecystectomy. She denies EtOH use. Denies tobacco or other drugs. She has not tried anything else to alleviate her symptoms.  Past Medical History  Diagnosis Date  . Hypertension   . Pancreatitis   . Hyperlipemia   . History of hiatal hernia     "it went away on it's own"  . Depression   . Cellulitis 06/2015    rt hand   . Asthma   . Type II diabetes mellitus (HCC)     insulin dependent   . Neuropathy ([place])      "feet & hands "   Past Surgical History  Procedure Laterality Date  . Cesarean section  1981; 1987  . Shoulder arthroscopy w/ rotator cuff repair Right   . Cholecystectomy  05/11/2012    Procedure: LAPAROSCOPIC CHOLECYSTECTOMY WITH INTRAOPERATIVE CHOLANGIOGRAM;  Surgeon: Gayland Curry, MD,FACS;  Location: [place];  Service: General;  Laterality: N/A;  . Abdominal hysterectomy    . Tubal ligation  10/25/1999    Archie Endo 01/11/2011   No family history on file. Social History  Substance Use Topics  . Smoking status: Current Every Day Smoker -- 0.50 packs/day for 40 years    Types: Cigarettes  . Smokeless  tobacco: Never Used  . Alcohol Use: No   OB History    No data available     Review of Systems  All other systems reviewed and are negative.     Allergies  Penicillins; Azithromycin; Sulfa antibiotics; and Tramadol  Home Medications   Prior to Admission medications   Medication Sig Start Date End Date Taking? Authorizing Provider  atorvastatin (LIPITOR) 40 MG tablet Take 40 mg by mouth daily.   Yes Historical Provider, MD  insulin glargine (LANTUS) 100 UNIT/ML injection Inject 0.5 mLs (50 Units total) into the skin daily. Patient taking differently: Inject 50 Units into the skin 2 (two) times daily.  07/06/15  Yes Sela Hua, MD  insulin regular (HUMULIN R) 100 units/mL injection Inject 5-25 Units into the skin 3 (three) times daily. 03/22/14  Yes Historical Provider, MD  lisinopril-hydrochlorothiazide (PRINZIDE,ZESTORETIC) 10-12.5 MG tablet Take 1 tablet by mouth daily. 01/15/16  Yes Bjorn Pippin, PA-C  amitriptyline (ELAVIL) 25 MG tablet Take 25 mg by mouth at bedtime.    Historical Provider, MD  aspirin EC 81 MG tablet Take 81 mg by mouth daily.    Historical Provider, MD  clindamycin (CLEOCIN) 300 MG capsule Take 2 capsules (600 mg total) by mouth 3 (three) times daily. 07/06/15   Sela Hua, MD  ferrous sulfate 325 (  65 FE) MG tablet Take 325 mg by mouth daily as needed. Takes when her iron is low    Historical Provider, MD  fluconazole (DIFLUCAN) 150 MG tablet Take 1 tablet (150 mg total) by mouth daily. 01/15/16   Bjorn Pippin, PA-C  HYDROcodone-acetaminophen (NORCO/VICODIN) 5-325 MG per tablet Take 0.5 tablets by mouth at bedtime as needed (cough). Patient not taking: Reported on 07/04/2015 07/13/14   Gregor Hams, MD  insulin aspart (NOVOLOG) 100 UNIT/ML injection Inject 10 Units into the skin 3 (three) times daily before meals. 07/06/15   Sela Hua, MD  insulin glargine (LANTUS) 100 UNIT/ML injection Inject 0.5 mLs (50 Units total) into the skin daily. 07/06/15    Sela Hua, MD  naproxen (NAPROSYN) 500 MG tablet Take 1 tablet (500 mg total) by mouth 2 (two) times daily with a meal. Patient not taking: Reported on 07/04/2015 10/04/12   Noemi Chapel, MD  nitrofurantoin, macrocrystal-monohydrate, (MACROBID) 100 MG capsule Take 1 capsule (100 mg total) by mouth 2 (two) times daily. 01/15/16   Bjorn Pippin, PA-C  nystatin (MYCOSTATIN/NYSTOP) 100000 UNIT/GM POWD Apply to affected area twice daily until rash goes away. 07/06/15   Sela Hua, MD  oxyCODONE-acetaminophen (PERCOCET) 10-325 MG per tablet Take 1 tablet by mouth every 4 (four) hours as needed for pain. Patient not taking: Reported on 07/04/2015 10/01/12   Domenic Moras, PA-C  oxyCODONE-acetaminophen (PERCOCET) 5-325 MG per tablet Take 1 tablet by mouth every 4 (four) hours as needed for pain. Patient not taking: Reported on 07/04/2015 10/04/12   Noemi Chapel, MD  polyethylene glycol powder (GLYCOLAX/MIRALAX) powder Take 8.5 g by mouth daily as needed. For constipation. Takes less than whole packet at times    Historical Provider, MD  pravastatin (PRAVACHOL) 40 MG tablet Take 40 mg by mouth daily.    Historical Provider, MD  pyridOXINE (VITAMIN B-6) 50 MG tablet Take 50 mg by mouth daily as needed. For tired spells    Historical Provider, MD  terbinafine (LAMISIL) 250 MG tablet Take 250 mg by mouth daily. For 12 weeks starting on 09/16/12    Historical Provider, MD  vitamin B-12 (CYANOCOBALAMIN) 250 MCG tablet Take 250 mcg by mouth daily.    Historical Provider, MD   BP 125/69 mmHg  Pulse 69  Temp(Src) 98.2 F (36.8 C) (Oral)  Resp 18  SpO2 95% Physical Exam  Constitutional: She is oriented to person, place, and time.  HENT:  Right Ear: External ear normal.  Left Ear: External ear normal.  Nose: Nose normal.  Mouth/Throat: Oropharynx is clear and moist. No oropharyngeal exudate.  Eyes: Conjunctivae and EOM are normal. Pupils are equal, round, and reactive to light.  Neck: Normal range of motion.  Neck supple.  Cardiovascular: Normal rate, regular rhythm, normal heart sounds and intact distal pulses.   Pulmonary/Chest: Effort normal and breath sounds normal. No respiratory distress. She has no wheezes.  Abdominal: Soft. Bowel sounds are normal. She exhibits no distension. There is no rebound.  +epigastric ttp + bilateral CVA tenderness Abdomen soft, nondistended, no rebound, no guarding  Musculoskeletal: She exhibits no edema.  Neurological: She is alert and oriented to person, place, and time. No cranial nerve deficit.  Skin: Skin is warm and dry.  Psychiatric: She has a normal mood and affect.  Nursing note and vitals reviewed.   ED Course  Procedures (including critical care time) Labs Review Labs Reviewed  LIPASE, BLOOD - Abnormal; Notable for the following:  Lipase 613 (*)    All other components within normal limits  COMPREHENSIVE METABOLIC PANEL - Abnormal; Notable for the following:    Potassium 3.2 (*)    Chloride 100 (*)    Glucose, Bld 278 (*)    Albumin 3.4 (*)    All other components within normal limits  CBC  URINALYSIS, ROUTINE W REFLEX MICROSCOPIC (NOT AT [facility])    Imaging Review No results found. I have personally reviewed and evaluated these images and lab results as part of my medical decision-making.   EKG Interpretation None      MDM   Final diagnoses:  Acute pancreatitis, unspecified complication status, unspecified pancreatitis type    Labs reveal mild hypokalemia of 3.2, lipase elevated to 613. Fluids, pain meds, and anti-emetics given. Will replete potassium in the ED. Will obtain CT abd/pelvis to assess pancreatitis. NPO and continue fluids. UA is pending though I do suspect possible persistent UTI/pyelonephritis. On repeat discussion pt continues to deny EtOH use; states she did drink socially in her youth but has not drank in >20 years. I am not sure the cause of her pancreatitis. Given unknown etiology, degree of pain, and history I  will call unassigned medicine for obs admission for pancreatitis.  I spoke with Dyanne Carrel of the hospitalist team who will admit pt to med-surg obs.   Anne Ng, PA-C 01/21/16 [place], MD 01/23/16 [id number]

## 2016-01-21 NOTE — H&P (Signed)
[facility] History and Physical  ALTHEIA SHADWELL [id number] DOB: 07-25-1966 DOA: 01/21/2016  Referring physician: Delrae Rend PCP: Default, Provider, MD   Chief Complaint: abdominal pain  HPI: Barbara Wang is a 50 y.o. female with a past medical history includes hypertension, height per lipidemia, pancreatitis, diabetes, obesity since emergency department with the chief complaint today history of abdominal pain. Initial evaluation reveals pancreatitis.  Information is obtained from the patient. She states one week ago she was diagnosed with urinary tract back and was treated with Macrobid which she says she completed. He reports continued dysuria and frequency in spite of completing antibiotics. 3 days ago she developed epigastric pain that radiates across her abdomen to bilateral flanks. She describes the pain as constant sharp. Nothing makes it better or worse. Associated symptoms include nausea with 2 episodes of vomiting. She denies bloody emesis or coffee-ground emesis. He is status post cholecystectomy she denies EtOH use. She denies chest pain palpitation headache dizziness syncope or near-syncope. She denies fever chills cough diarrhea constipation bright red blood per rectum.  In the emergency department she is afebrile hemodynamically stable and not hypoxic. She is provided with fentanyl IV fluids.   Review of Systems:  10 point review of systems complete and all systems are negative except as indicated in the history of present illness  Past Medical History  Diagnosis Date  . Hypertension   . Pancreatitis   . Hyperlipemia   . History of hiatal hernia     "it went away on it's own"  . Depression   . Cellulitis 06/2015    rt hand   . Asthma   . Type II diabetes mellitus (HCC)     insulin dependent   . Neuropathy ([place])      "feet & hands "  . Obesity    Past Surgical History  Procedure Laterality Date  . Cesarean section  1981; 1987  . Shoulder  arthroscopy w/ rotator cuff repair Right   . Cholecystectomy  05/11/2012    Procedure: LAPAROSCOPIC CHOLECYSTECTOMY WITH INTRAOPERATIVE CHOLANGIOGRAM;  Surgeon: Gayland Curry, MD,FACS;  Location: [place];  Service: General;  Laterality: N/A;  . Abdominal hysterectomy    . Tubal ligation  10/25/1999    Archie Endo 01/11/2011   Social History:  reports that she has been smoking Cigarettes.  She has a 20 pack-year smoking history. She has never used smokeless tobacco. She reports that she does not drink alcohol or use illicit drugs.  Allergies  Allergen Reactions  . Penicillins Hives and Shortness Of Breath    Has patient had a PCN reaction causing immediate rash, facial/tongue/throat swelling, SOB or lightheadedness with hypotension: Yes Has patient had a PCN reaction causing severe rash involving mucus membranes or skin necrosis: No Has patient had a PCN reaction that required hospitalization No Has patient had a PCN reaction occurring within the last 10 years: No If all of the above answers are "NO", then may proceed with Cephalosporin use.  . Azithromycin Hives  . Sulfa Antibiotics Hives  . Tramadol Hives    No family history on file. Family medical history positive for diabetes and her mother. She has 3 siblings their family medical history is negative for cancer positive for diabetes and hypertension  Prior to Admission medications   Medication Sig Start Date End Date Taking? Authorizing Provider  atorvastatin (LIPITOR) 40 MG tablet Take 40 mg by mouth daily.   Yes Historical Provider, MD  hydrochlorothiazide (MICROZIDE) 12.5 MG capsule Take 12.5  mg by mouth daily. 03/22/14  Yes Historical Provider, MD  insulin glargine (LANTUS) 100 UNIT/ML injection Inject 0.5 mLs (50 Units total) into the skin daily. Patient taking differently: Inject 50 Units into the skin 2 (two) times daily.  07/06/15  Yes Sela Hua, MD  insulin regular (HUMULIN R) 100 units/mL injection Inject 5-25 Units into the skin  3 (three) times daily. 03/22/14  Yes Historical Provider, MD  lisinopril-hydrochlorothiazide (PRINZIDE,ZESTORETIC) 10-12.5 MG tablet Take 1 tablet by mouth daily. 01/15/16  Yes Bjorn Pippin, PA-C  nystatin (MYCOSTATIN/NYSTOP) 100000 UNIT/GM POWD Apply to affected area twice daily until rash goes away. Patient not taking: Reported on 01/21/2016 07/06/15   Sela Hua, MD  polyethylene glycol powder [facility]) powder Take 8.5 g by mouth daily as needed. For constipation. Takes less than whole packet at times    Historical Provider, MD   Physical Exam: Filed Vitals:   01/21/16 1415 01/21/16 1430 01/21/16 1445 01/21/16 1446  BP: 131/70 123/72 133/68   Pulse:   66   Temp:      TempSrc:      Resp:      SpO2:   90% 95%    Wt Readings from Last 3 Encounters:  07/05/15 101.197 kg (223 lb 1.6 oz)  10/01/12 76.658 kg (169 lb)  06/04/12 76.114 kg (167 lb 12.8 oz)    General:  Appears Somewhat lethargic and comfortable Eyes: PERRL, normal lids, irises & conjunctiva ENT: grossly normal hearing, lips & tongue, mucous membranes of her mouth are pink somewhat dry Neck: no LAD, masses or thyromegaly Cardiovascular: RRR, no m/r/g. No LE edema.  Respiratory: CTA bilaterally, no w/r/r. Normal respiratory effort. Aspiration somewhat shallow Abdomen: soft, obese positive bowel sounds are sluggish. Mild tenderness to palpation particularly in epigastric area, no guarding or rebounding Skin: no rash or induration seen on limited exam Musculoskeletal: grossly normal tone BUE/BLE Psychiatric: grossly normal mood and affect, speech fluent and appropriate Neurologic: grossly non-focal. speech slow but clear facial symmetry responds appropriately to questions and commands albeit of slowly           Labs on Admission:  Basic Metabolic Panel:  Recent Labs Lab 01/15/16 1610 01/21/16 1100  NA 144 138  K 3.7 3.2*  CL 103 100*  CO2  --  28  GLUCOSE 184* 278*  BUN 12 14  CREATININE 0.70 0.81   CALCIUM  --  9.6   Liver Function Tests:  Recent Labs Lab 01/21/16 1100  AST 17  ALT 18  ALKPHOS 92  BILITOT 0.7  PROT 7.1  ALBUMIN 3.4*    Recent Labs Lab 01/21/16 1100  LIPASE 613*   No results for input(s): AMMONIA in the last 168 hours. CBC:  Recent Labs Lab 01/15/16 1610 01/21/16 1100  WBC  --  9.0  HGB 15.6* 14.3  HCT 46.0 41.8  MCV  --  85.8  PLT  --  374   Cardiac Enzymes: No results for input(s): CKTOTAL, CKMB, CKMBINDEX, TROPONINI in the last 168 hours.  BNP (last 3 results) No results for input(s): BNP in the last 8760 hours.  ProBNP (last 3 results) No results for input(s): PROBNP in the last 8760 hours.  CBG: No results for input(s): GLUCAP in the last 168 hours.  Radiological Exams on Admission: Ct Abdomen Pelvis W Contrast  01/21/2016  CLINICAL DATA:  Bilateral abdominal pain radiating through to back 3-4 days with nausea and vomiting. Concern for pancreatitis. EXAM: CT ABDOMEN AND PELVIS WITH CONTRAST  TECHNIQUE: Multidetector CT imaging of the abdomen and pelvis was performed using the standard protocol following bolus administration of intravenous contrast. CONTRAST:  151mL ISOVUE-300 IOPAMIDOL (ISOVUE-300) INJECTION 61% COMPARISON:  07/16/2012 FINDINGS: Lung bases demonstrate mild bibasilar scarring. Abdominal images demonstrate evidence of previous cholecystectomy. The liver, spleen and adrenal glands are within normal. There is subtle hazy attenuation of the peripancreatic fat adjacent the head of the pancreas as well as tail of the pancreas. Findings may be due to mild acute pancreatitis. Kidneys are normal in size without hydronephrosis or nephrolithiasis. There are a few small sub cm renal cortical hypodensities bilaterally too small to characterize but likely cysts. Ureters are within normal. Vascular structures are within normal. Stomach is within normal. Small bowel is within normal. Appendix is normal. Colon is unremarkable. Few small lymph  nodes in gastrohepatic ligament and celiac axis. The mesentery is otherwise unremarkable. Pelvic images demonstrate surgical absence of the uterus. Bladder, adnexa and rectum are normal. No free fluid. Remaining bones hand soft tissues are unchanged. IMPRESSION: Subtle findings as described likely representing mild acute pancreatitis. Few small bilateral sub cm renal cortical hypodensities too small to characterize but likely cysts. Electronically Signed   By: Marin Olp M.D.   On: 01/21/2016 14:06    EKG:   Assessment/Plan Principal Problem:   Pancreatitis Active Problems:   Diabetes mellitus (HCC)   HTN (hypertension)   Hypokalemia   Obesity  #1. Pancreatitis. Patient with history of same. She is status post cholecystectomy 2013. Lipase 613. CT of the abdomen and pelvis yields subtle findings consistent with mild acute pancreatitis. Denies EtOH. May be related to medications -Admit to MedSurg -Vigorous IV fluids -Bowel rest -advance to clears as tolerated -Hold home hydrochlorothiazide -Analgesia -Anti-emetic -Recheck lipase tomorrow  #2. Hypokalemia. Mild. Likely related to decreased oral intake in the setting of nausea vomiting secondary to #1. -We'll replete -We'll check a magnesium level -Recheck in the morning  #3. Abdominal pain/nausea/vomiting. Likely related to above. Patient also diagnosed with UTI last week and completed antibiotics. Continues to complain of dysuria. Urinalysis with no bacteria 6-30 WBCs.  -We will await urine culture before providing antibiotics -Monitor urine output  #4. Diabetes. Patient on Lantus and sliding scale at home. Serum glucose 178 on admission -We'll continue Lantus at half her home dose -Will use sliding scale insulin for optimal control -We'll obtain a hemoglobin A1c  #5. Hypertension. Fair control in the emergency department. Home medications include hydrochlorothiazide, lisinopril -We'll hold these for now -We'll use when  necessary hydralazine -Monitor closely  #6. Obesity -nutritional consult -obtain BMI   Code Status: full DVT Prophylaxis: Family Communication: none present Disposition Plan: home when ready hopefully 24 hours  Time spent: 12 minutes  [facility]

## 2016-01-21 NOTE — ED Notes (Signed)
Patient transported to CT 

## 2016-01-21 NOTE — ED Notes (Signed)
PA Dyanne Carrel at bedside

## 2016-01-21 NOTE — ED Notes (Signed)
Pt c/o abd pain started 2-3 days ago after being treated for UTI (wseen at urgent care) also has hoarse voice since pollen "was bad-- can't get rid of it" c/o burning on urination.

## 2016-01-21 NOTE — ED Notes (Signed)
PA Sam in room.

## 2016-01-21 NOTE — Care Management Note (Signed)
Case Management Note  Patient Details  Name: Barbara Wang MRN: [id number] Date of Birth: 03/06/1966  Subjective/Objective:       50 y.o. female with history of HTN, HLD, pancreatitis, DM who presents to the ED for evaluation of abdominal pain. She states she was seen at [facility] one week ago and was diagnosed with a UTI./ From alone.             Action/Plan: Follow for disposition needs.   Expected Discharge Date:  01/22/16               Expected Discharge Plan:  Home/Self Care  In-House Referral:  NA  Discharge planning Services  CM Consult  Post Acute Care Choice:    Choice offered to:     DME Arranged:    DME Agency:     HH Arranged:    HH Agency:     Status of Service:  In process, will continue to follow  Medicare Important Message Given:    Date Medicare IM Given:    Medicare IM give by:    Date Additional Medicare IM Given:    Additional Medicare Important Message give by:     If discussed at [place] of Stay Meetings, dates discussed:    Additional Comments: Pt enrolled in [facility] program 06/2015; will not be eligible until 06/2016. Fuller Mandril, RN 01/21/2016, 3:01 PM

## 2016-01-22 DIAGNOSIS — B3749 Other urogenital candidiasis: Secondary | ICD-10-CM | POA: Diagnosis present

## 2016-01-22 DIAGNOSIS — I10 Essential (primary) hypertension: Secondary | ICD-10-CM

## 2016-01-22 DIAGNOSIS — F17210 Nicotine dependence, cigarettes, uncomplicated: Secondary | ICD-10-CM | POA: Diagnosis present

## 2016-01-22 DIAGNOSIS — K859 Acute pancreatitis without necrosis or infection, unspecified: Principal | ICD-10-CM

## 2016-01-22 DIAGNOSIS — E785 Hyperlipidemia, unspecified: Secondary | ICD-10-CM | POA: Diagnosis present

## 2016-01-22 DIAGNOSIS — Z7982 Long term (current) use of aspirin: Secondary | ICD-10-CM | POA: Diagnosis not present

## 2016-01-22 DIAGNOSIS — E669 Obesity, unspecified: Secondary | ICD-10-CM | POA: Diagnosis present

## 2016-01-22 DIAGNOSIS — Z6834 Body mass index (BMI) 34.0-34.9, adult: Secondary | ICD-10-CM | POA: Diagnosis not present

## 2016-01-22 DIAGNOSIS — E876 Hypokalemia: Secondary | ICD-10-CM

## 2016-01-22 DIAGNOSIS — Z9049 Acquired absence of other specified parts of digestive tract: Secondary | ICD-10-CM | POA: Diagnosis not present

## 2016-01-22 DIAGNOSIS — E1165 Type 2 diabetes mellitus with hyperglycemia: Secondary | ICD-10-CM | POA: Diagnosis present

## 2016-01-22 DIAGNOSIS — Z794 Long term (current) use of insulin: Secondary | ICD-10-CM | POA: Diagnosis not present

## 2016-01-22 LAB — BASIC METABOLIC PANEL
Anion gap: 5 (ref 5–15)
BUN: 14 mg/dL (ref 6–20)
CO2: 26 mmol/L (ref 22–32)
Calcium: 8.6 mg/dL — ABNORMAL LOW (ref 8.9–10.3)
Chloride: 109 mmol/L (ref 101–111)
Creatinine, Ser: 0.76 mg/dL (ref 0.44–1.00)
GFR calc Af Amer: >60 mL/min (ref >60)
GFR calc non Af Amer: >60 mL/min (ref >60)
Glucose, Bld: 179 mg/dL — ABNORMAL HIGH (ref 65–99)
Potassium: 3.8 mmol/L (ref 3.5–5.1)
Sodium: 140 mmol/L (ref 135–145)

## 2016-01-22 LAB — URINE CULTURE

## 2016-01-22 LAB — GLUCOSE, CAPILLARY
Glucose-Capillary: 220 mg/dL — ABNORMAL HIGH (ref 65–99)
Glucose-Capillary: 104 mg/dL — ABNORMAL HIGH (ref 65–99)
Glucose-Capillary: 178 mg/dL — ABNORMAL HIGH (ref 65–99)
Glucose-Capillary: 111 mg/dL — ABNORMAL HIGH (ref 65–99)

## 2016-01-22 LAB — HEMOGLOBIN A1C
Hgb A1c MFr Bld: 12.2 % — ABNORMAL HIGH (ref 4.8–5.6)
Mean Plasma Glucose: 303 mg/dL

## 2016-01-22 LAB — LIPID PANEL
Cholesterol: 124 mg/dL (ref 0–200)
HDL: 25 mg/dL — ABNORMAL LOW (ref >40)
LDL Cholesterol: 74 mg/dL (ref 0–99)
Total CHOL/HDL Ratio: 5 RATIO
Triglycerides: 127 mg/dL (ref <150)
VLDL: 25 mg/dL (ref 0–40)

## 2016-01-22 LAB — CBC
HCT: 38.1 % (ref 36.0–46.0)
Hemoglobin: 12.2 g/dL (ref 12.0–15.0)
MCH: 28.3 pg (ref 26.0–34.0)
MCHC: 32 g/dL (ref 30.0–36.0)
MCV: 88.4 fL (ref 78.0–100.0)
Platelets: 307 10*3/uL (ref 150–400)
RBC: 4.31 MIL/uL (ref 3.87–5.11)
RDW: 12.8 % (ref 11.5–15.5)
WBC: 7.2 10*3/uL (ref 4.0–10.5)

## 2016-01-22 LAB — LIPASE, BLOOD: Lipase: 574 U/L — ABNORMAL HIGH (ref 11–51)

## 2016-01-22 MED ORDER — SODIUM CHLORIDE 0.9 % IV SOLN
INTRAVENOUS | Status: DC
  Administered 2016-01-22 – 2016-01-24 (×4): via INTRAVENOUS

## 2016-01-22 MED ORDER — FLUCONAZOLE IN SODIUM CHLORIDE 100-0.9 MG/50ML-% IV SOLN
100.0000 mg | INTRAVENOUS | Status: DC
  Administered 2016-01-22: 100 mg via INTRAVENOUS
  Filled 2016-01-22 (×6): qty 50

## 2016-01-22 MED ORDER — CIPROFLOXACIN IN D5W 200 MG/100ML IV SOLN: 200.0000 mg | INTRAVENOUS | Status: DC

## 2016-01-22 NOTE — Progress Notes (Signed)
Patient very upset and wanting to go out, this Probation officer explained to her that we need a clearance from her Md to go out, patient insisted that she needs to go out and when this Probation officer ask her why she is wanting to go out, she said, she needed to smoke, this Probation officer reminded patient our no smoking policy and patient insisted to go out. Nicotine offered but patient stated she need to smoke. MD paged.

## 2016-01-22 NOTE — Progress Notes (Addendum)
Patient ID: Barbara Wang, female   DOB: [birth year], 50 y.o.   MRN: [id number]  PROGRESS NOTE    Barbara Wang  [id number] DOB: 1966-05-28 DOA: 01/21/2016  PCP: Default, Provider, MD   Brief Narrative:  50 y.o. female with a past medical history significant for Dm on insulin, dyslipidemia and hypertension who presented to [facility] ED with upper epigastric and periumbilicalabdominal pain started few days prior to this admission, burning like, constant, radiating to the back and not relieved with tylenol. She has had associated nausea and 2 episodes of non bloody vomiting. Also, recently she completed Macrobid for uti but continues to have burning sensation on urination and pain with urination.   Assessment & Plan:  Epigastric and periumbilical pain / Acute pancreatitis  - Lipase on admission 613 and this am 574 - Check lipid level - Alcohol level not checked on admission, pt denied alcohol use  - CT abdomen showed mild acute pancreatitis  - Still has pain so will keep NPO, IV fluids and antiemetics as needed  - Continue pain management effort - LFT's are WNL - Recheck lipase in am  Hypokalemia - Due to GI losses - Supplemented - Now WNL  Yeast UTI - Start fluconazole today  Uncontrolled diabetes mellitus without complications with long term insulin use - Continue Lantus 25 units at bedtime and SSI - A1c 12.2 indicating poor glycemic control   Essential hypertension - Home meds not resumed as BP controlled 133/62  Obesity - Body mass index is 34 kg/(m^2). - Counseled on nutrition    DVT prophylaxis: Lovenox subQ Code Status: full code  Family Communication: no family at the bedside Disposition Plan: home once tolerates regula diet    Consultants:   None   Procedures:  None   Antimicrobials:   Fluconazole 01/22/2016 -->    Subjective: Says pain in stomach burning and radiating to back.  Objective: Filed Vitals:   01/21/16 1548 01/21/16 1600  01/21/16 2129 01/22/16 0450  BP: 124/55  129/58 132/62  Pulse: 64  71 66  Temp: 97.9 F (36.6 C)  97.8 F (36.6 C) 98.2 F (36.8 C)  TempSrc: Oral  Oral Oral  Resp: 18  18 18   Height:  5\' 4"  (1.626 m)    Weight:  89.9 kg (198 lb 3.1 oz)    SpO2: 96%  97% 98%    Intake/Output Summary (Last 24 hours) at 01/22/16 0659 Last data filed at 01/22/16 0500  Gross per 24 hour  Intake 2581.67 ml  Output    550 ml  Net 2031.67 ml   Filed Weights   01/21/16 1600  Weight: 89.9 kg (198 lb 3.1 oz)    Examination:  General exam: Appears calm and comfortable  Respiratory system: Clear to auscultation. Respiratory effort normal. Cardiovascular system: S1 & S2 heard, RRR. No JVD, murmurs, rubs, gallops or clicks. No pedal edema. Gastrointestinal system: Abdomen is nondistended, soft and tender in periumbilical area. No organomegaly or masses felt. Normal bowel sounds heard. Central nervous system: Alert and oriented. No focal neurological deficits. Extremities: Symmetric 5 x 5 power. Skin: No rashes, lesions or ulcers Psychiatry: Judgement and insight appear normal. Mood & affect appropriate.   Data Reviewed: I have personally reviewed following labs and imaging studies  CBC:  Recent Labs Lab 01/15/16 1610 01/21/16 1100 01/22/16 0447  WBC  --  9.0 7.2  HGB 15.6* 14.3 12.2  HCT 46.0 41.8 38.1  MCV  --  85.8 88.4  PLT  --  374 [id number]   Basic Metabolic Panel:  Recent Labs Lab 01/15/16 1610 01/21/16 1045 01/21/16 1100 01/22/16 0447  NA 144  --  138 140  K 3.7  --  3.2* 3.8  CL 103  --  100* 109  CO2  --   --  28 26  GLUCOSE 184*  --  278* 179*  BUN 12  --  14 14  CREATININE 0.70  --  0.81 0.76  CALCIUM  --   --  9.6 8.6*  MG  --  2.0  --   --    GFR: Estimated Creatinine Clearance: 89.3 mL/min (by C-G formula based on Cr of 0.76). Liver Function Tests:  Recent Labs Lab 01/21/16 1100  AST 17  ALT 18  ALKPHOS 92  BILITOT 0.7  PROT 7.1  ALBUMIN 3.4*    Recent  Labs Lab 01/21/16 1100 01/22/16 0447  LIPASE 613* 574*   No results for input(s): AMMONIA in the last 168 hours. Coagulation Profile: No results for input(s): INR, PROTIME in the last 168 hours. Cardiac Enzymes: No results for input(s): CKTOTAL, CKMB, CKMBINDEX, TROPONINI in the last 168 hours. BNP (last 3 results) No results for input(s): PROBNP in the last 8760 hours. HbA1C:  Recent Labs  01/21/16 1045  HGBA1C 12.2*   CBG:  Recent Labs Lab 01/21/16 1645 01/21/16 2136  GLUCAP 239* 204*   Lipid Profile: No results for input(s): CHOL, HDL, LDLCALC, TRIG, CHOLHDL, LDLDIRECT in the last 72 hours. Thyroid Function Tests: No results for input(s): TSH, T4TOTAL, FREET4, T3FREE, THYROIDAB in the last 72 hours. Anemia Panel: No results for input(s): VITAMINB12, FOLATE, FERRITIN, TIBC, IRON, RETICCTPCT in the last 72 hours. Urine analysis:    Component Value Date/Time   COLORURINE YELLOW 01/21/2016 1314   APPEARANCEUR CLOUDY* 01/21/2016 1314   LABSPEC 1.025 01/21/2016 1314   PHURINE 5.5 01/21/2016 1314   GLUCOSEU 500* 01/21/2016 1314   HGBUR NEGATIVE 01/21/2016 1314   BILIRUBINUR SMALL* 01/21/2016 1314   KETONESUR 40* 01/21/2016 1314   PROTEINUR NEGATIVE 01/21/2016 1314   UROBILINOGEN 0.2 01/15/2016 1524   NITRITE NEGATIVE 01/21/2016 1314   LEUKOCYTESUR SMALL* 01/21/2016 1314   Sepsis Labs: @LABRCNTIP (procalcitonin:4,lacticidven:4)   )No results found for this or any previous visit (from the past 240 hour(s)).    Radiology Studies: Ct Abdomen Pelvis W Contrast 01/21/2016   Subtle findings as described likely representing mild acute pancreatitis. Few small bilateral sub cm renal cortical hypodensities too small to characterize but likely cysts.    Scheduled Meds: . [COMPLETED] sodium chloride   Intravenous STAT  . antiseptic oral rinse  7 mL Mouth Rinse BID  . enoxaparin (LOVENOX) injection  40 mg Subcutaneous Q24H  . insulin aspart  0-15 Units Subcutaneous TID  WC  . insulin aspart  0-5 Units Subcutaneous QHS  . insulin glargine  25 Units Subcutaneous QHS  . potassium chloride  40 mEq Oral BID   Continuous Infusions: . sodium chloride 100 mL/hr at 01/22/16 0500      Time spent: 25 minutes  Greater than 50% of the time spent on counseling and coordinating the care.   Leisa Lenz, MD [facility] Pager [phone]  If 7PM-7AM, please contact night-coverage [email or web address] Password [facility] 01/22/2016, 6:59 AM

## 2016-01-23 LAB — GLUCOSE, CAPILLARY
Glucose-Capillary: 104 mg/dL — ABNORMAL HIGH (ref 65–99)
Glucose-Capillary: 242 mg/dL — ABNORMAL HIGH (ref 65–99)
Glucose-Capillary: 308 mg/dL — ABNORMAL HIGH (ref 65–99)
Glucose-Capillary: 222 mg/dL — ABNORMAL HIGH (ref 65–99)

## 2016-01-23 LAB — LIPASE, BLOOD: Lipase: 149 U/L — ABNORMAL HIGH (ref 11–51)

## 2016-01-23 MED ORDER — LISINOPRIL 10 MG PO TABS
10.0000 mg | ORAL_TABLET | Freq: Every day | ORAL | Status: DC
  Administered 2016-01-23 – 2016-01-25 (×3): 10 mg via ORAL
  Filled 2016-01-23 (×3): qty 1

## 2016-01-23 MED ORDER — HYDROCHLOROTHIAZIDE 12.5 MG PO CAPS
12.5000 mg | ORAL_CAPSULE | Freq: Every day | ORAL | Status: DC
  Administered 2016-01-23 – 2016-01-25 (×3): 12.5 mg via ORAL
  Filled 2016-01-23 (×3): qty 1

## 2016-01-23 MED ORDER — CIPROFLOXACIN IN D5W 200 MG/100ML IV SOLN
200.0000 mg | Freq: Two times a day (BID) | INTRAVENOUS | Status: DC
  Administered 2016-01-23 (×2): 200 mg via INTRAVENOUS
  Filled 2016-01-23 (×3): qty 100

## 2016-01-23 MED ORDER — LISINOPRIL-HYDROCHLOROTHIAZIDE 10-12.5 MG PO TABS: 1.0000 | ORAL_TABLET | Freq: Every day | ORAL | Status: DC

## 2016-01-23 MED ORDER — LORAZEPAM 2 MG/ML IJ SOLN
0.5000 mg | Freq: Once | INTRAMUSCULAR | Status: AC
  Administered 2016-01-23: 0.5 mg via INTRAVENOUS
  Filled 2016-01-23: qty 1

## 2016-01-23 MED ORDER — CIPROFLOXACIN IN D5W 400 MG/200ML IV SOLN: 400.0000 mg | INTRAVENOUS | Status: DC

## 2016-01-23 NOTE — Progress Notes (Signed)
Patient complaining of burning with urination.  She says diflucan is not working;wants an antibiotic. Paged MD for Pyridium for symptom relief.

## 2016-01-23 NOTE — Progress Notes (Signed)
Patient ID: Barbara Wang, female   DOB: 1965-11-05, 50 y.o.   MRN: [id number]  PROGRESS NOTE    Barbara Wang  [id number] DOB: 01-14-1966 DOA: 01/21/2016  PCP: Default, Provider, MD   Brief Narrative:  50 y.o. female with a past medical history significant for Dm on insulin, dyslipidemia and hypertension who presented to [facility] ED with upper epigastric and periumbilicalabdominal pain started few days prior to this admission, burning like, constant, radiating to the back and not relieved with tylenol. She has had associated nausea and 2 episodes of non bloody vomiting. Also, recently she completed Macrobid for uti but continues to have burning sensation on urination and pain with urination.   Assessment & Plan:  Epigastric and periumbilical pain / Acute pancreatitis  - CT scan on the admission with findings of acute pancreatitis - Lipase on admission 613 and this am 574 - Lipase level this morning is 149 - Lipid panel demonstrated normal LDL and triglycerides - Advance diet to clear liquids - Continue IV fluids for hydration  Hypokalemia - Due to GI losses - Supplemented - Now WNL  Yeast UTI - Started fluconazole 01/22/2016 - Since patient also has burning sensation on urination she requests an antibiotic which we will use ciprofloxacin  Uncontrolled diabetes mellitus without complications with long term insulin use - A1c 12.2 indicating poor glycemic control  - CBGs in past 24 hours: 178, 111, 104 - Continue Lantus 25 units at bedtime and SSI  Essential hypertension - Home meds not resume at the time of the admission because blood pressure was controlled without antihypertensive - Since blood pressure is 154/74 we will resume home medications   Obesity - Body mass index is 34 kg/(m^2). - Counseled on nutrition    DVT prophylaxis: Lovenox subQ Code Status: full code  Family Communication: no family at the bedside Disposition Plan: home once tolerates  regular diet    Consultants:   None   Procedures:  None   Antimicrobials:   Fluconazole 01/22/2016 -->   Cipro 01/23/2016 -->    Subjective: Reports pain is better but has burnign sensation on urination.   Objective: Filed Vitals:   01/22/16 0450 01/22/16 1439 01/22/16 2154 01/23/16 0630  BP: 132/62 163/75 159/69 154/74  Pulse: 66 69 60 57  Temp: 98.2 F (36.8 C) 98.2 F (36.8 C) 97.6 F (36.4 C) 97.5 F (36.4 C)  TempSrc: Oral Oral Oral Oral  Resp: 18 18 17 18   Height:      Weight:      SpO2: 98% 100% 100% 100%    Intake/Output Summary (Last 24 hours) at 01/23/16 [id number] Last data filed at 01/23/16 0810  Gross per 24 hour  Intake    800 ml  Output    400 ml  Net    400 ml   Filed Weights   01/21/16 1600  Weight: 89.9 kg (198 lb 3.1 oz)    Examination:  General exam: Appears in no acute distress  Respiratory system: Respiratory effort normal. No wheezing  Cardiovascular system: S1 & S2 appreciated, Rate controlled Gastrointestinal system: (+) BS, non tender  Central nervous system: No focal neurological deficits. Extremities: no edema, palpable pulses  Skin: warm and dry  Psychiatry: no agitation, no restlessness   Data Reviewed: I have personally reviewed following labs and imaging studies  CBC:  Recent Labs Lab 01/21/16 1100 01/22/16 0447  WBC 9.0 7.2  HGB 14.3 12.2  HCT 41.8 38.1  MCV 85.8 88.4  PLT 374  [id number]   Basic Metabolic Panel:  Recent Labs Lab 01/21/16 1045 01/21/16 1100 01/22/16 0447  NA  --  138 140  K  --  3.2* 3.8  CL  --  100* 109  CO2  --  28 26  GLUCOSE  --  278* 179*  BUN  --  14 14  CREATININE  --  0.81 0.76  CALCIUM  --  9.6 8.6*  MG 2.0  --   --    GFR: Estimated Creatinine Clearance: 89.3 mL/min (by C-G formula based on Cr of 0.76). Liver Function Tests:  Recent Labs Lab 01/21/16 1100  AST 17  ALT 18  ALKPHOS 92  BILITOT 0.7  PROT 7.1  ALBUMIN 3.4*    Recent Labs Lab 01/21/16 1100  01/22/16 0447 01/23/16 0518  LIPASE 613* 574* 149*   No results for input(s): AMMONIA in the last 168 hours. Coagulation Profile: No results for input(s): INR, PROTIME in the last 168 hours. Cardiac Enzymes: No results for input(s): CKTOTAL, CKMB, CKMBINDEX, TROPONINI in the last 168 hours. BNP (last 3 results) No results for input(s): PROBNP in the last 8760 hours. HbA1C:  Recent Labs  01/21/16 1045  HGBA1C 12.2*   CBG:  Recent Labs Lab 01/22/16 0753 01/22/16 1152 01/22/16 1656 01/22/16 2035 01/23/16 0816  GLUCAP 220* 104* 178* 111* 104*   Lipid Profile:  Recent Labs  01/22/16 1044  CHOL 124  HDL 25*  LDLCALC 74  TRIG 127  CHOLHDL 5.0   Thyroid Function Tests: No results for input(s): TSH, T4TOTAL, FREET4, T3FREE, THYROIDAB in the last 72 hours. Anemia Panel: No results for input(s): VITAMINB12, FOLATE, FERRITIN, TIBC, IRON, RETICCTPCT in the last 72 hours. Urine analysis:    Component Value Date/Time   COLORURINE YELLOW 01/21/2016 1314   APPEARANCEUR CLOUDY* 01/21/2016 1314   LABSPEC 1.025 01/21/2016 1314   PHURINE 5.5 01/21/2016 1314   GLUCOSEU 500* 01/21/2016 1314   HGBUR NEGATIVE 01/21/2016 1314   BILIRUBINUR SMALL* 01/21/2016 1314   KETONESUR 40* 01/21/2016 1314   PROTEINUR NEGATIVE 01/21/2016 1314   UROBILINOGEN 0.2 01/15/2016 1524   NITRITE NEGATIVE 01/21/2016 1314   LEUKOCYTESUR SMALL* 01/21/2016 1314   Sepsis Labs: @LABRCNTIP (procalcitonin:4,lacticidven:4)   Recent Results (from the past 240 hour(s))  Urine culture     Status: Abnormal   Collection Time: 01/21/16  1:14 PM  Result Value Ref Range Status   Specimen Description URINE, RANDOM  Final   Special Requests NONE  Final   Culture MULTIPLE SPECIES PRESENT, SUGGEST RECOLLECTION (A)  Final   Report Status 01/22/2016 FINAL  Final      Radiology Studies: Ct Abdomen Pelvis W Contrast 01/21/2016   Subtle findings as described likely representing mild acute pancreatitis. Few small  bilateral sub cm renal cortical hypodensities too small to characterize but likely cysts.    Scheduled Meds: . antiseptic oral rinse  7 mL Mouth Rinse BID  . ciprofloxacin  200 mg Intravenous Q12H  . enoxaparin (LOVENOX) injection  40 mg Subcutaneous Q24H  . fluconazole (DIFLUCAN) IV  100 mg Intravenous Q24H  . insulin aspart  0-15 Units Subcutaneous TID WC  . insulin aspart  0-5 Units Subcutaneous QHS  . insulin glargine  25 Units Subcutaneous QHS  . potassium chloride  40 mEq Oral BID   Continuous Infusions: . sodium chloride 100 mL/hr at 01/23/16 0615     LOS: 1 day  Time spent: 25 minutes  Greater than 50% of the time spent on counseling and coordinating the care.  Leisa Lenz, MD [facility] Pager [phone]  If 7PM-7AM, please contact night-coverage [email or web address] Password [facility] 01/23/2016, 8:24 AM

## 2016-01-23 NOTE — Progress Notes (Signed)
Patient made RN aware that she was going outside. RN told patient per policy, she cannot go outside without her doctor's order. "I'm 50 years old, I'll go where I want when I want." RN paged MD. Awaiting a return call.

## 2016-01-23 NOTE — Progress Notes (Signed)
RN called into patient's room due to patient having difficulty breathing. On assessment, patient's O2 sat is 100% on RA and pulse is 54. Per patient, "It feels like my chest is tight and I can't catch my breath." MD paged. Awaiting a return call. Will continue to monitor.

## 2016-01-24 LAB — GLUCOSE, CAPILLARY
Glucose-Capillary: 104 mg/dL — ABNORMAL HIGH (ref 65–99)
Glucose-Capillary: 67 mg/dL (ref 65–99)
Glucose-Capillary: 188 mg/dL — ABNORMAL HIGH (ref 65–99)
Glucose-Capillary: 285 mg/dL — ABNORMAL HIGH (ref 65–99)
Glucose-Capillary: 394 mg/dL — ABNORMAL HIGH (ref 65–99)

## 2016-01-24 LAB — LIPASE, BLOOD: Lipase: 107 U/L — ABNORMAL HIGH (ref 11–51)

## 2016-01-24 MED ORDER — DIPHENHYDRAMINE HCL 12.5 MG/5ML PO ELIX: 12.5000 mg | ORAL_SOLUTION | Freq: Four times a day (QID) | ORAL | Status: DC | PRN

## 2016-01-24 MED ORDER — HYDROCODONE-ACETAMINOPHEN 10-325 MG PO TABS
1.0000 | ORAL_TABLET | ORAL | Status: DC | PRN
  Administered 2016-01-24 – 2016-01-25 (×2): 2 via ORAL
  Filled 2016-01-24 (×3): qty 2

## 2016-01-24 MED ORDER — CIPROFLOXACIN HCL 250 MG PO TABS: 250.0000 mg | ORAL_TABLET | Freq: Two times a day (BID) | ORAL | Status: DC

## 2016-01-24 MED ORDER — CIPROFLOXACIN HCL 250 MG PO TABS
250.0000 mg | ORAL_TABLET | Freq: Two times a day (BID) | ORAL | Status: DC
  Administered 2016-01-24 – 2016-01-25 (×3): 250 mg via ORAL
  Filled 2016-01-24 (×3): qty 1

## 2016-01-24 MED ORDER — FLUCONAZOLE 100 MG PO TABS: 100.0000 mg | ORAL_TABLET | Freq: Every day | ORAL | Status: DC

## 2016-01-24 MED ORDER — FLUCONAZOLE 100 MG PO TABS
100.0000 mg | ORAL_TABLET | Freq: Every day | ORAL | Status: DC
  Administered 2016-01-24 – 2016-01-25 (×2): 100 mg via ORAL
  Filled 2016-01-24 (×2): qty 1

## 2016-01-24 NOTE — Discharge Instructions (Addendum)

## 2016-01-24 NOTE — Discharge Summary (Signed)
Physician Discharge Summary  Barbara Wang [id number] DOB: Jan 07, 1966 DOA: 01/21/2016   PCP: Default, Provider, MD  Admit date: 01/21/2016 Discharge date: 01/24/2016  Recommendations for Outpatient Follow-up:  1. Cipro and fluconazole on discharge for 5 days for bacterial and yeast uti   Discharge Diagnoses:  Principal Problem:   Pancreatitis Active Problems:   Diabetes mellitus ([place])   HTN (hypertension)   Hypokalemia   Obesity    Discharge Condition: stable   Diet recommendation: as tolerated; soft, cooked at least for next weeks or so  History of present illness:  50 y.o. female with a past medical history significant for Dm on insulin, dyslipidemia and hypertension who presented to [facility] ED with upper epigastric and periumbilicalabdominal pain started few days prior to this admission, burning like, constant, radiating to the back and not relieved with tylenol. She has had associated nausea and 2 episodes of non bloody vomiting. Also, recently she completed Macrobid for uti but continues to have burning sensation on urination and pain with urination.  Hospital Course:   Assessment & Plan:  Epigastric and periumbilical pain / Acute pancreatitis  - CT scan on the admission with findings of acute pancreatitis - Lipase on admission 613 --> 107 - Wanted regular diet today and tolerated it well - Lipid panel demonstrated normal LDL and triglycerides  Hypokalemia - Due to GI losses - Supplemented and WNL  Yeast UTI - Started fluconazole 01/22/2016 - Since patient also has burning sensation on urination she requests an antibiotic which we will use ciprofloxacin. Cipro and diflucan for 5 days on discharge   Uncontrolled diabetes mellitus without complications with long term insulin use - A1c 12.2 indicating poor glycemic control  - Continue Lantus per home regimen   Essential hypertension - Continue home meds on discharge   Obesity - Body mass index is 34  kg/(m^2). - Counseled on nutrition    DVT prophylaxis: Lovenox subQ Code Status: full code  Family Communication: no family at the bedside    Consultants:   None  Procedures:  None  Antimicrobials:   Fluconazole 01/22/2016 -->   Cipro 01/23/2016 -->   Signed:  Leisa Lenz, MD  [facility] 01/24/2016, 3:07 PM  Pager #: [phone]  Time spent in minutes: less than 30 minutes    Discharge Exam: Filed Vitals:   01/24/16 0621 01/24/16 1226  BP: 154/70 176/80  Pulse: 60 62  Temp: 98.1 F (36.7 C) 98.2 F (36.8 C)  Resp: 18 18   Filed Vitals:   01/23/16 1324 01/23/16 2212 01/24/16 0621 01/24/16 1226  BP: 159/70 161/81 154/70 176/80  Pulse: 67 69 60 62  Temp: 98.2 F (36.8 C) 97.8 F (36.6 C) 98.1 F (36.7 C) 98.2 F (36.8 C)  TempSrc: Oral Oral Oral Oral  Resp: 16 17 18 18   Height:      Weight:      SpO2: 100% 99% 100% 100%    General: Pt is alert, follows commands appropriately, not in acute distress Cardiovascular: Regular rate and rhythm, S1/S2 +, no murmurs Respiratory: Clear to auscultation bilaterally, no wheezing, no crackles, no rhonchi Abdominal: Soft, non tender, non distended, bowel sounds +, no guarding Extremities: no edema, no cyanosis, pulses palpable bilaterally DP and PT Neuro: Grossly nonfocal  Discharge Instructions  Discharge Instructions    Call MD for:  difficulty breathing, headache or visual disturbances    Complete by:  As directed      Call MD for:  persistant nausea and vomiting  Complete by:  As directed      Call MD for:  severe uncontrolled pain    Complete by:  As directed      Diet - low sodium heart healthy    Complete by:  As directed      Discharge instructions    Complete by:  As directed   Please take cipro and fluconazole for 5 days on discharge for UTI     Increase activity slowly    Complete by:  As directed             Medication List    STOP taking these medications         nystatin powder  Generic drug:  nystatin      TAKE these medications        atorvastatin 40 MG tablet  Commonly known as:  LIPITOR  Take 40 mg by mouth daily.     ciprofloxacin 250 MG tablet  Commonly known as:  CIPRO  Take 1 tablet (250 mg total) by mouth 2 (two) times daily.     fluconazole 100 MG tablet  Commonly known as:  DIFLUCAN  Take 1 tablet (100 mg total) by mouth daily.     HUMULIN R 100 units/mL injection  Generic drug:  insulin regular  Inject 5-25 Units into the skin 3 (three) times daily.     insulin glargine 100 UNIT/ML injection  Commonly known as:  LANTUS  Inject 0.5 mLs (50 Units total) into the skin daily.     lisinopril-hydrochlorothiazide 10-12.5 MG tablet  Commonly known as:  PRINZIDE,ZESTORETIC  Take 1 tablet by mouth daily.     polyethylene glycol powder powder  Commonly known as:  GLYCOLAX/MIRALAX  Take 8.5 g by mouth daily as needed. For constipation. Takes less than whole packet at times          The results of significant diagnostics from this hospitalization (including imaging, microbiology, ancillary and laboratory) are listed below for reference.    Significant Diagnostic Studies: Ct Abdomen Pelvis W Contrast  01/21/2016  CLINICAL DATA:  Bilateral abdominal pain radiating through to back 3-4 days with nausea and vomiting. Concern for pancreatitis. EXAM: CT ABDOMEN AND PELVIS WITH CONTRAST TECHNIQUE: Multidetector CT imaging of the abdomen and pelvis was performed using the standard protocol following bolus administration of intravenous contrast. CONTRAST:  136mL ISOVUE-300 IOPAMIDOL (ISOVUE-300) INJECTION 61% COMPARISON:  07/16/2012 FINDINGS: Lung bases demonstrate mild bibasilar scarring. Abdominal images demonstrate evidence of previous cholecystectomy. The liver, spleen and adrenal glands are within normal. There is subtle hazy attenuation of the peripancreatic fat adjacent the head of the pancreas as well as tail of the pancreas. Findings  may be due to mild acute pancreatitis. Kidneys are normal in size without hydronephrosis or nephrolithiasis. There are a few small sub cm renal cortical hypodensities bilaterally too small to characterize but likely cysts. Ureters are within normal. Vascular structures are within normal. Stomach is within normal. Small bowel is within normal. Appendix is normal. Colon is unremarkable. Few small lymph nodes in gastrohepatic ligament and celiac axis. The mesentery is otherwise unremarkable. Pelvic images demonstrate surgical absence of the uterus. Bladder, adnexa and rectum are normal. No free fluid. Remaining bones hand soft tissues are unchanged. IMPRESSION: Subtle findings as described likely representing mild acute pancreatitis. Few small bilateral sub cm renal cortical hypodensities too small to characterize but likely cysts. Electronically Signed   By: Marin Olp M.D.   On: 01/21/2016 14:06    Microbiology: Recent  Results (from the past 240 hour(s))  Urine culture     Status: Abnormal   Collection Time: 01/21/16  1:14 PM  Result Value Ref Range Status   Specimen Description URINE, RANDOM  Final   Special Requests NONE  Final   Culture MULTIPLE SPECIES PRESENT, SUGGEST RECOLLECTION (A)  Final   Report Status 01/22/2016 FINAL  Final     Labs: Basic Metabolic Panel:  Recent Labs Lab 01/21/16 1045 01/21/16 1100 01/22/16 0447  NA  --  138 140  K  --  3.2* 3.8  CL  --  100* 109  CO2  --  28 26  GLUCOSE  --  278* 179*  BUN  --  14 14  CREATININE  --  0.81 0.76  CALCIUM  --  9.6 8.6*  MG 2.0  --   --    Liver Function Tests:  Recent Labs Lab 01/21/16 1100  AST 17  ALT 18  ALKPHOS 92  BILITOT 0.7  PROT 7.1  ALBUMIN 3.4*    Recent Labs Lab 01/21/16 1100 01/22/16 0447 01/23/16 0518 01/24/16 0432  LIPASE 613* 574* 149* 107*   No results for input(s): AMMONIA in the last 168 hours. CBC:  Recent Labs Lab 01/21/16 1100 01/22/16 0447  WBC 9.0 7.2  HGB 14.3 12.2   HCT 41.8 38.1  MCV 85.8 88.4  PLT 374 307   Cardiac Enzymes: No results for input(s): CKTOTAL, CKMB, CKMBINDEX, TROPONINI in the last 168 hours. BNP: BNP (last 3 results) No results for input(s): BNP in the last 8760 hours.  ProBNP (last 3 results) No results for input(s): PROBNP in the last 8760 hours.  CBG:  Recent Labs Lab 01/23/16 1633 01/23/16 2158 01/24/16 0731 01/24/16 0826 01/24/16 1117  GLUCAP 308* 222* 67 104* 188*

## 2016-01-25 LAB — GLUCOSE, CAPILLARY
Glucose-Capillary: 190 mg/dL — ABNORMAL HIGH (ref 65–99)
Glucose-Capillary: 383 mg/dL — ABNORMAL HIGH (ref 65–99)

## 2016-01-25 MED ORDER — OXYCODONE-ACETAMINOPHEN 5-325 MG PO TABS: 1.0000 | ORAL_TABLET | ORAL | Status: DC | PRN

## 2016-01-25 NOTE — Progress Notes (Signed)
Pt still c/o pain after eating. Discussed pancreatitis diet with her with good understanding. Discharged home accompanied by daughter.

## 2016-01-25 NOTE — Care Management Note (Signed)
Case Management Note  Patient Details  Name: SHAMIKIA JUHAS MRN: [id number] Date of Birth: Sep 14, 1965  Subjective/Objective:                    Action/Plan:  Gave patient [place] letter for antibiotics , patient aware pain medication is NOT covered . Patient will be staying in [facility] at discharge . Information given to patient on [facility] . Patient states she has heard of it in past . Explained she needs to call to schedule appointment. [facility] needs to speak directly to the patient to see if she is eligible.   Patient voiced understanding of above.  Expected Discharge Date:  01/22/16               Expected Discharge Plan:  Home/Self Care  In-House Referral:  NA  Discharge planning Services  CM Consult, [facility], [facility]  Post Acute Care Choice:    Choice offered to:  Patient  DME Arranged:    DME Agency:     HH Arranged:    [place] Agency:     Status of Service:  Completed, signed off  Medicare Important Message Given:    Date Medicare IM Given:    Medicare IM give by:    Date Additional Medicare IM Given:    Additional Medicare Important Message give by:     If discussed at [place] of Stay Meetings, dates discussed:    Additional Comments:  Marilu Favre, RN 01/25/2016, 10:36 AM

## 2016-01-25 NOTE — Progress Notes (Signed)
Patient seen and examined at the bedside. She did not feel she was ready for discharge yesterday because of pain. Patient is not nauseous and she is not throwing up at this time. She still has mild pain but eating fine this morning, tolerating regular diet without nausea or vomiting.  She is from [place] area and has primary care physician in [place] area. We'll try to schedule an appointment with our community health wellness clinic but I'm sure of their time availability prior to patient's flight back to [place] area.  Medically she is stable for discharge. Please refer to discharge summary completed 01/24/2016.  Leisa Lenz [facility] [id number]

## 2016-02-02 ENCOUNTER — Ambulatory Visit: Payer: Self-pay | Attending: Internal Medicine | Admitting: Internal Medicine

## 2016-02-02 ENCOUNTER — Other Ambulatory Visit: Payer: Self-pay | Admitting: Internal Medicine

## 2016-02-02 ENCOUNTER — Encounter: Payer: Self-pay | Admitting: Internal Medicine

## 2016-02-02 ENCOUNTER — Other Ambulatory Visit: Payer: Self-pay | Admitting: *Deleted

## 2016-02-02 VITALS — BP 100/65 | HR 89 | Temp 98.0°F | Resp 18 | Ht 64.0 in | Wt 192.8 lb

## 2016-02-02 DIAGNOSIS — Z79899 Other long term (current) drug therapy: Secondary | ICD-10-CM | POA: Insufficient documentation

## 2016-02-02 DIAGNOSIS — K449 Diaphragmatic hernia without obstruction or gangrene: Secondary | ICD-10-CM | POA: Insufficient documentation

## 2016-02-02 DIAGNOSIS — E119 Type 2 diabetes mellitus without complications: Secondary | ICD-10-CM | POA: Insufficient documentation

## 2016-02-02 DIAGNOSIS — K859 Acute pancreatitis without necrosis or infection, unspecified: Secondary | ICD-10-CM | POA: Insufficient documentation

## 2016-02-02 DIAGNOSIS — F17210 Nicotine dependence, cigarettes, uncomplicated: Secondary | ICD-10-CM | POA: Insufficient documentation

## 2016-02-02 DIAGNOSIS — I10 Essential (primary) hypertension: Secondary | ICD-10-CM | POA: Insufficient documentation

## 2016-02-02 DIAGNOSIS — E1165 Type 2 diabetes mellitus with hyperglycemia: Secondary | ICD-10-CM

## 2016-02-02 DIAGNOSIS — Z880 Allergy status to penicillin: Secondary | ICD-10-CM | POA: Insufficient documentation

## 2016-02-02 DIAGNOSIS — Z794 Long term (current) use of insulin: Secondary | ICD-10-CM | POA: Insufficient documentation

## 2016-02-02 DIAGNOSIS — Z9851 Tubal ligation status: Secondary | ICD-10-CM | POA: Insufficient documentation

## 2016-02-02 DIAGNOSIS — Z9049 Acquired absence of other specified parts of digestive tract: Secondary | ICD-10-CM | POA: Insufficient documentation

## 2016-02-02 DIAGNOSIS — Z90710 Acquired absence of both cervix and uterus: Secondary | ICD-10-CM | POA: Insufficient documentation

## 2016-02-02 LAB — CBC WITH DIFFERENTIAL/PLATELET
Basophils Absolute: 75 cells/uL (ref 0–200)
Basophils Relative: 1 %
Eosinophils Absolute: 150 cells/uL (ref 15–500)
Eosinophils Relative: 2 %
HCT: 45.2 % — ABNORMAL HIGH (ref 35.0–45.0)
Hemoglobin: 15.2 g/dL (ref 11.7–15.5)
Lymphocytes Relative: 25 %
Lymphs Abs: 1875 cells/uL (ref 850–3900)
MCH: 28.7 pg (ref 27.0–33.0)
MCHC: 33.6 g/dL (ref 32.0–36.0)
MCV: 85.4 fL (ref 80.0–100.0)
MPV: 10.4 fL (ref 7.5–12.5)
Monocytes Absolute: 525 cells/uL (ref 200–950)
Monocytes Relative: 7 %
Neutro Abs: 4875 cells/uL (ref 1500–7800)
Neutrophils Relative %: 65 %
Platelets: 433 10*3/uL — ABNORMAL HIGH (ref 140–400)
RBC: 5.29 MIL/uL — ABNORMAL HIGH (ref 3.80–5.10)
RDW: 13.4 % (ref 11.0–15.0)
WBC: 7.5 10*3/uL (ref 3.8–10.8)

## 2016-02-02 LAB — POCT URINALYSIS DIPSTICK
Bilirubin, UA: NEGATIVE
Blood, UA: NEGATIVE
Ketones, UA: NEGATIVE
Nitrite, UA: NEGATIVE
Spec Grav, UA: 1.01
Urobilinogen, UA: 0.2
pH, UA: 5.5

## 2016-02-02 LAB — GLUCOSE, POCT (MANUAL RESULT ENTRY): POC Glucose: 281 mg/dl — AB (ref 70–99)

## 2016-02-02 MED ORDER — PANTOPRAZOLE SODIUM 40 MG PO TBEC: 40.0000 mg | DELAYED_RELEASE_TABLET | Freq: Every day | ORAL | Status: DC

## 2016-02-02 MED ORDER — ACETAMINOPHEN-CODEINE #3 300-30 MG PO TABS: 1.0000 | ORAL_TABLET | ORAL | Status: DC | PRN

## 2016-02-02 MED ORDER — ACETAMINOPHEN-CODEINE #2 300-15 MG PO TABS: 1.0000 | ORAL_TABLET | ORAL | Status: DC | PRN

## 2016-02-02 MED FILL — ?PANTOPRAZOLE SOD DR 40MG: 40 MG | 30 days supply | Qty: 30 | Fill #0

## 2016-02-02 MED FILL — ACETAMINOPHEN/COD #3 TABLET: 300-30 | 2 days supply | Qty: 10 | Fill #0

## 2016-02-02 NOTE — Assessment & Plan Note (Addendum)
Unclear the etiology of current pain- May be a flare of pancreatitis- needs labs- see orders  Her pain is more left sided- will get a UA- look for blood. I doubt renal colic but needs eval  i've asked her to take PPI for the next few days while we get her labs back. Also clear liquids until we call her tomorrow.

## 2016-02-02 NOTE — Progress Notes (Signed)
Patient comes in for post hospital visit She was hospitalized with mild pancreatitis (CT report) and elevated lipase> 600)  Was also treated for uti with cipr and antifungal. Reviewed other labs- no significant abnormality  Worth noting that CT abd without evidence of renal calculi  Past Medical History  Diagnosis Date  . Hypertension   . Pancreatitis   . Hyperlipemia   . History of hiatal hernia     "it went away on it's own"  . Depression   . Cellulitis 06/2015    rt hand   . Asthma   . Type II diabetes mellitus (HCC)     insulin dependent   . Neuropathy ([place])      "feet & hands "  . Obesity     Social History   Social History  . Marital Status: Married    Spouse Name: N/A  . Number of Children: N/A  . Years of Education: N/A   Occupational History  . Not on file.   Social History Main Topics  . Smoking status: Current Every Day Smoker -- 0.50 packs/day for 40 years    Types: Cigarettes  . Smokeless tobacco: Never Used  . Alcohol Use: No  . Drug Use: No  . Sexual Activity: Not Currently    Birth Control/ Protection: Surgical   Other Topics Concern  . Not on file   Social History Narrative    Past Surgical History  Procedure Laterality Date  . Cesarean section  1981; 1987  . Shoulder arthroscopy w/ rotator cuff repair Right   . Cholecystectomy  05/11/2012    Procedure: LAPAROSCOPIC CHOLECYSTECTOMY WITH INTRAOPERATIVE CHOLANGIOGRAM;  Surgeon: Gayland Curry, MD,FACS;  Location: [facility];  Service: General;  Laterality: N/A;  . Abdominal hysterectomy    . Tubal ligation  10/25/1999    Archie Endo 01/11/2011    History reviewed. No pertinent family history.  Allergies  Allergen Reactions  . Penicillins Hives and Shortness Of Breath    Has patient had a PCN reaction causing immediate rash, facial/tongue/throat swelling, SOB or lightheadedness with hypotension: Yes Has patient had a PCN reaction causing severe rash involving mucus membranes or skin necrosis:  No Has patient had a PCN reaction that required hospitalization No Has patient had a PCN reaction occurring within the last 10 years: No If all of the above answers are "NO", then may proceed with Cephalosporin use.  . Azithromycin Hives  . Sulfa Antibiotics Hives  . Tramadol Hives    Current Outpatient Prescriptions on File Prior to Visit  Medication Sig Dispense Refill  . atorvastatin (LIPITOR) 40 MG tablet Take 40 mg by mouth daily.    . insulin glargine (LANTUS) 100 UNIT/ML injection Inject 0.5 mLs (50 Units total) into the skin daily. (Patient taking differently: Inject 50 Units into the skin 2 (two) times daily. ) 10 mL 11  . insulin regular (HUMULIN R) 100 units/mL injection Inject 5-25 Units into the skin 3 (three) times daily.    Marland Kitchen lisinopril-hydrochlorothiazide (PRINZIDE,ZESTORETIC) 10-12.5 MG tablet Take 1 tablet by mouth daily. 30 tablet 0  . polyethylene glycol powder (GLYCOLAX/MIRALAX) powder Take 8.5 g by mouth daily as needed. For constipation. Takes less than whole packet at times    . oxyCODONE-acetaminophen (PERCOCET) 5-325 MG tablet Take 1 tablet by mouth every 4 (four) hours as needed for severe pain. (Patient not taking: Reported on 02/02/2016) 20 tablet 0   No current facility-administered medications on file prior to visit.     patient denies chest  pain, shortness of breath, orthopnea. Denies lower extremity edema, abdominal pain, change in appetite, change in bowel movements. Patient denies rashes, musculoskeletal complaints. No other specific complaints in a complete review of systems.   BP 100/65 mmHg  Pulse 89  Temp(Src) 98 F (36.7 C) (Oral)  Resp 18  Ht 5\' 4"  (1.626 m)  Wt 192 lb 12.8 oz (87.454 kg)  BMI 33.08 kg/m2  SpO2 100%  uncomfortable appearingfemale in no acute distress. HEENT exam atraumatic, normocephalic, extraocular muscles are intact. No icterus.  Neck is supple. No jugular venous distention no thyromegaly. Chest clear to auscultation without  increased work of breathing. Cardiac exam S1 and S2 are regular. Abdominal exam active bowel sounds, soft, nontender. Extremities no edema. Neurologic exam she is alert without any motor sensory deficits. Gait is normal.  She does have some left sided flank tenderness  Pancreatitis Unclear the etiology of current pain- May be a flare of pancreatitis- needs labs- see orders  Her pain is more left sided- will get a UA- look for blood. I doubt renal colic but needs eval  i've asked her to take PPI for the next few days while we get her labs back. Also clear liquids until we call her tomorrow.     i do not think she has uti or pyelo- she tells me she completed 5 days of cipro and fluconazole.   Ok to use prn percocet-- she should minimize  wil try ppi for a few days

## 2016-02-02 NOTE — Patient Instructions (Signed)
Stay hydrated with clear liquids- no sugar  We will call you tomorrow. If we don't call you please call us back.

## 2016-02-02 NOTE — Progress Notes (Signed)
Patient is here for HFU  Patient complains of having lower back pain scaled currently at a 10.  Patient has taken medication today. Patient has only drank a cup of coffee today.

## 2016-02-02 NOTE — Addendum Note (Signed)
Addended by: Lisabeth Pick on: 02/02/2016 03:42 PM   Modules accepted: Orders

## 2016-02-03 ENCOUNTER — Telehealth: Payer: Self-pay

## 2016-02-03 LAB — MICROALBUMIN / CREATININE URINE RATIO
Creatinine, Urine: 128 mg/dL (ref 20–320)
Microalb Creat Ratio: 19 mcg/mg creat (ref <30)
Microalb, Ur: 2.4 mg/dL

## 2016-02-03 LAB — HEPATIC FUNCTION PANEL
ALT: 17 U/L (ref 6–29)
AST: 14 U/L (ref 10–35)
Albumin: 4.1 g/dL (ref 3.6–5.1)
Alkaline Phosphatase: 109 U/L (ref 33–130)
Bilirubin, Direct: 0.1 mg/dL (ref <=0.2)
Indirect Bilirubin: 0.3 mg/dL (ref 0.2–1.2)
Total Bilirubin: 0.4 mg/dL (ref 0.2–1.2)
Total Protein: 7.7 g/dL (ref 6.1–8.1)

## 2016-02-03 LAB — LIPASE: Lipase: 1894 U/L — ABNORMAL HIGH (ref 7–60)

## 2016-02-03 LAB — BASIC METABOLIC PANEL
BUN: 24 mg/dL (ref 7–25)
CO2: 25 mmol/L (ref 20–31)
Calcium: 10 mg/dL (ref 8.6–10.4)
Chloride: 100 mmol/L (ref 98–110)
Creat: 0.92 mg/dL (ref 0.50–1.05)
Glucose, Bld: 266 mg/dL — ABNORMAL HIGH (ref 65–99)
Potassium: 4.1 mmol/L (ref 3.5–5.3)
Sodium: 137 mmol/L (ref 135–146)

## 2016-02-03 LAB — URINE CULTURE
Colony Count: NO GROWTH
Organism ID, Bacteria: NO GROWTH

## 2016-02-03 NOTE — Telephone Encounter (Signed)
Pt. Called requesting her Lab results. Please f/u with pt. °

## 2016-02-04 ENCOUNTER — Encounter (HOSPITAL_COMMUNITY): Payer: Self-pay

## 2016-02-04 ENCOUNTER — Inpatient Hospital Stay (HOSPITAL_COMMUNITY)
Admission: EM | Admit: 2016-02-04 | Discharge: 2016-02-08 | DRG: 194 | Disposition: A | Discharge status: Home / Self Care | Payer: Medicaid - Out of State | Attending: Internal Medicine | Admitting: Internal Medicine

## 2016-02-04 DIAGNOSIS — J189 Pneumonia, unspecified organism: Principal | ICD-10-CM | POA: Diagnosis present

## 2016-02-04 DIAGNOSIS — E869 Volume depletion, unspecified: Secondary | ICD-10-CM | POA: Diagnosis present

## 2016-02-04 DIAGNOSIS — E785 Hyperlipidemia, unspecified: Secondary | ICD-10-CM | POA: Diagnosis present

## 2016-02-04 DIAGNOSIS — I10 Essential (primary) hypertension: Secondary | ICD-10-CM | POA: Diagnosis present

## 2016-02-04 DIAGNOSIS — E1142 Type 2 diabetes mellitus with diabetic polyneuropathy: Secondary | ICD-10-CM | POA: Diagnosis present

## 2016-02-04 DIAGNOSIS — B379 Candidiasis, unspecified: Secondary | ICD-10-CM | POA: Diagnosis present

## 2016-02-04 DIAGNOSIS — E118 Type 2 diabetes mellitus with unspecified complications: Secondary | ICD-10-CM | POA: Diagnosis present

## 2016-02-04 DIAGNOSIS — E860 Dehydration: Secondary | ICD-10-CM | POA: Diagnosis present

## 2016-02-04 DIAGNOSIS — N179 Acute kidney failure, unspecified: Secondary | ICD-10-CM | POA: Diagnosis present

## 2016-02-04 DIAGNOSIS — F17210 Nicotine dependence, cigarettes, uncomplicated: Secondary | ICD-10-CM | POA: Diagnosis present

## 2016-02-04 DIAGNOSIS — E876 Hypokalemia: Secondary | ICD-10-CM | POA: Diagnosis present

## 2016-02-04 DIAGNOSIS — N390 Urinary tract infection, site not specified: Secondary | ICD-10-CM | POA: Diagnosis present

## 2016-02-04 DIAGNOSIS — R8290 Unspecified abnormal findings in urine: Secondary | ICD-10-CM | POA: Diagnosis present

## 2016-02-04 DIAGNOSIS — Z09 Encounter for follow-up examination after completed treatment for conditions other than malignant neoplasm: Secondary | ICD-10-CM

## 2016-02-04 DIAGNOSIS — R1013 Epigastric pain: Secondary | ICD-10-CM

## 2016-02-04 DIAGNOSIS — E1140 Type 2 diabetes mellitus with diabetic neuropathy, unspecified: Secondary | ICD-10-CM | POA: Diagnosis present

## 2016-02-04 DIAGNOSIS — E1165 Type 2 diabetes mellitus with hyperglycemia: Secondary | ICD-10-CM | POA: Diagnosis present

## 2016-02-04 DIAGNOSIS — N760 Acute vaginitis: Secondary | ICD-10-CM | POA: Diagnosis present

## 2016-02-04 DIAGNOSIS — B9689 Other specified bacterial agents as the cause of diseases classified elsewhere: Secondary | ICD-10-CM | POA: Diagnosis present

## 2016-02-04 DIAGNOSIS — Z794 Long term (current) use of insulin: Secondary | ICD-10-CM

## 2016-02-04 DIAGNOSIS — K2970 Gastritis, unspecified, without bleeding: Secondary | ICD-10-CM | POA: Diagnosis present

## 2016-02-04 LAB — CBC
HCT: 43.2 % (ref 36.0–46.0)
Hemoglobin: 14.5 g/dL (ref 12.0–15.0)
MCH: 28.8 pg (ref 26.0–34.0)
MCHC: 33.6 g/dL (ref 30.0–36.0)
MCV: 85.9 fL (ref 78.0–100.0)
Platelets: 333 10*3/uL (ref 150–400)
RBC: 5.03 MIL/uL (ref 3.87–5.11)
RDW: 12.7 % (ref 11.5–15.5)
WBC: 7 10*3/uL (ref 4.0–10.5)

## 2016-02-04 LAB — TSH: TSH: 0.428 u[IU]/mL (ref 0.350–4.500)

## 2016-02-04 LAB — COMPREHENSIVE METABOLIC PANEL
ALT: 22 U/L (ref 14–54)
AST: 18 U/L (ref 15–41)
Albumin: 3.6 g/dL (ref 3.5–5.0)
Alkaline Phosphatase: 99 U/L (ref 38–126)
Anion gap: 9 (ref 5–15)
BUN: 22 mg/dL — ABNORMAL HIGH (ref 6–20)
CO2: 27 mmol/L (ref 22–32)
Calcium: 9.6 mg/dL (ref 8.9–10.3)
Chloride: 98 mmol/L — ABNORMAL LOW (ref 101–111)
Creatinine, Ser: 1.12 mg/dL — ABNORMAL HIGH (ref 0.44–1.00)
GFR calc Af Amer: >60 mL/min (ref >60)
GFR calc non Af Amer: 55 mL/min — ABNORMAL LOW (ref >60)
Glucose, Bld: 502 mg/dL — ABNORMAL HIGH (ref 65–99)
Potassium: 4 mmol/L (ref 3.5–5.1)
Sodium: 134 mmol/L — ABNORMAL LOW (ref 135–145)
Total Bilirubin: 0.8 mg/dL (ref 0.3–1.2)
Total Protein: 7.7 g/dL (ref 6.5–8.1)

## 2016-02-04 LAB — URINALYSIS, ROUTINE W REFLEX MICROSCOPIC
Bilirubin Urine: NEGATIVE
Glucose, UA: >1000 mg/dL — AB
Ketones, ur: 15 mg/dL — AB
Nitrite: NEGATIVE
Protein, ur: NEGATIVE mg/dL
Specific Gravity, Urine: 1.034 — ABNORMAL HIGH (ref 1.005–1.030)
pH: 5.5 (ref 5.0–8.0)

## 2016-02-04 LAB — URINE MICROSCOPIC-ADD ON
Bacteria, UA: NONE SEEN
RBC / HPF: NONE SEEN RBC/hpf (ref 0–5)

## 2016-02-04 LAB — WET PREP, GENITAL
Sperm: NONE SEEN
Trich, Wet Prep: NONE SEEN
Yeast Wet Prep HPF POC: NONE SEEN

## 2016-02-04 LAB — CBG MONITORING, ED: Glucose-Capillary: 534 mg/dL — ABNORMAL HIGH (ref 65–99)

## 2016-02-04 LAB — LIPASE, BLOOD: Lipase: 152 U/L — ABNORMAL HIGH (ref 11–51)

## 2016-02-04 MED ORDER — ALUM & MAG HYDROXIDE-SIMETH 200-200-20 MG/5ML PO SUSP
15.0000 mL | Freq: Once | ORAL | Status: AC
  Administered 2016-02-04: 15 mL via ORAL
  Filled 2016-02-04: qty 30

## 2016-02-04 MED ORDER — INSULIN ASPART 100 UNIT/ML ~~LOC~~ SOLN
8.0000 [IU] | Freq: Once | SUBCUTANEOUS | Status: AC
  Administered 2016-02-05: 8 [IU] via INTRAVENOUS
  Filled 2016-02-04: qty 1

## 2016-02-04 MED ORDER — CYCLOBENZAPRINE HCL 10 MG PO TABS
5.0000 mg | ORAL_TABLET | Freq: Once | ORAL | Status: AC
  Administered 2016-02-04: 5 mg via ORAL
  Filled 2016-02-04: qty 1

## 2016-02-04 MED ORDER — ONDANSETRON 4 MG PO TBDP
4.0000 mg | ORAL_TABLET | Freq: Once | ORAL | Status: AC | PRN
  Administered 2016-02-04: 4 mg via ORAL

## 2016-02-04 MED ORDER — LIDOCAINE VISCOUS 2 % MT SOLN
15.0000 mL | Freq: Once | OROMUCOSAL | Status: AC
  Administered 2016-02-04: 15 mL via OROMUCOSAL
  Filled 2016-02-04: qty 15

## 2016-02-04 MED ORDER — HYDROMORPHONE HCL 1 MG/ML IJ SOLN: 1.0000 mg | Freq: Once | INTRAMUSCULAR | Status: DC

## 2016-02-04 MED ORDER — FLUCONAZOLE 100 MG PO TABS
150.0000 mg | ORAL_TABLET | Freq: Once | ORAL | Status: AC
  Administered 2016-02-05: 150 mg via ORAL
  Filled 2016-02-04: qty 2

## 2016-02-04 MED ORDER — SODIUM CHLORIDE 0.9 % IV BOLUS (SEPSIS)
1000.0000 mL | Freq: Once | INTRAVENOUS | Status: AC
  Administered 2016-02-04: 1000 mL via INTRAVENOUS

## 2016-02-04 MED ORDER — ONDANSETRON 4 MG PO TBDP
ORAL_TABLET | ORAL | Status: AC
  Filled 2016-02-04: qty 1

## 2016-02-04 MED ORDER — ACETAMINOPHEN 500 MG PO TABS
1000.0000 mg | ORAL_TABLET | Freq: Once | ORAL | Status: AC
  Administered 2016-02-04: 1000 mg via ORAL
  Filled 2016-02-04: qty 2

## 2016-02-04 NOTE — ED Notes (Signed)
Informed MD of pt's request for pain medication

## 2016-02-04 NOTE — Telephone Encounter (Signed)
Patient called to get lab results. Please follow up.

## 2016-02-04 NOTE — ED Notes (Signed)
CBG was 534, RN notified

## 2016-02-04 NOTE — ED Provider Notes (Signed)
CSN: [id number]     Arrival date & time 02/04/16  1711 History   First MD Initiated Contact with Patient 02/04/16 1924     Chief Complaint  Patient presents with  . Abdominal Pain   HPI Pt reports that she has severe abdominal burning after eating and the pain radiates into her lt for about a month or so. Endorses flank pain, worsening, progressive, present for longer than the dysuria. Started on protonix 2 days ago.   The pain starts in the stomatch and worse when she eats, burning, radiates to both sides, now only the left side.Movement makes pain worse. Pt not very active, difficult to ambulate.  No vomiting, some nausea. Does not take ibuprofen or nsaids. No vaginal discharge. Review of records shows pt had multiple contaminated urine samples, but most recent ones in 6/7 were negative. rx with cirpo, macrobid.   Pt states that after pancretitis treated (recent admission) she felt better but now coming back  Pt. Is having dysuria. no vaginal dc  Has cp and sob  Pt. Is weak and fatigued.   Past Medical History  Diagnosis Date  . Hypertension   . Pancreatitis   . Hyperlipemia   . History of hiatal hernia     "it went away on it's own"  . Depression   . Cellulitis 06/2015    rt hand   . Asthma   . Type II diabetes mellitus (HCC)     insulin dependent   . Neuropathy ([place])      "feet & hands "  . Obesity    Past Surgical History  Procedure Laterality Date  . Cesarean section  1981; 1987  . Shoulder arthroscopy w/ rotator cuff repair Right   . Cholecystectomy  05/11/2012    Procedure: LAPAROSCOPIC CHOLECYSTECTOMY WITH INTRAOPERATIVE CHOLANGIOGRAM;  Surgeon: Gayland Curry, MD,FACS;  Location: [facility];  Service: General;  Laterality: N/A;  . Abdominal hysterectomy    . Tubal ligation  10/25/1999    Archie Endo 01/11/2011   No family history on file. Social History  Substance Use Topics  . Smoking status: Current Every Day Smoker -- 0.50 packs/day for 40 years    Types: Cigarettes   . Smokeless tobacco: Never Used  . Alcohol Use: No   OB History    No data available     Review of Systems  Constitutional: Negative for fever.  Allergic/Immunologic: Negative for immunocompromised state.  All other systems reviewed and are negative.     Allergies  Penicillins; Azithromycin; Sulfa antibiotics; and Tramadol  Home Medications   Prior to Admission medications   Medication Sig Start Date End Date Taking? Authorizing Provider  acetaminophen (TYLENOL) 650 MG CR tablet Take 1,300 mg by mouth every 4 (four) hours.   Yes Historical Provider, MD  acetaminophen-codeine (TYLENOL #3) 300-30 MG tablet Take 1 tablet by mouth every 4 (four) hours as needed for moderate pain. 02/02/16  Yes Lisabeth Pick, MD  atorvastatin (LIPITOR) 40 MG tablet Take 40 mg by mouth daily at 6 PM.    Yes Historical Provider, MD  insulin glargine (LANTUS) 100 UNIT/ML injection Inject 0.5 mLs (50 Units total) into the skin daily. Patient taking differently: Inject 50 Units into the skin 2 (two) times daily.  07/06/15  Yes Sela Hua, MD  insulin regular (HUMULIN R) 100 units/mL injection Inject 5-25 Units into the skin 3 (three) times daily. 03/22/14  Yes Historical Provider, MD  lisinopril-hydrochlorothiazide (PRINZIDE,ZESTORETIC) 10-12.5 MG tablet Take 1 tablet by  mouth daily. Patient taking differently: Take 1 tablet by mouth at bedtime.  01/15/16  Yes Bjorn Pippin, PA-C  pantoprazole (PROTONIX) 40 MG tablet Take 1 tablet (40 mg total) by mouth daily. 02/02/16  Yes Bruce Kendall Flack, MD   BP 112/71 mmHg  Pulse 98  Temp(Src) 98.7 F (37.1 C) (Oral)  Resp 18  Ht 5\' 4"  (1.626 m)  Wt 87.091 kg  BMI 32.94 kg/m2  SpO2 96% Physical Exam  Constitutional: She is oriented to person, place, and time. She appears well-developed and well-nourished. No distress.  HENT:  Head: Normocephalic and atraumatic.  Eyes: Conjunctivae are normal. Right eye exhibits no discharge. Left eye exhibits no discharge.   Neck: Normal range of motion. Neck supple.  Cardiovascular: Normal rate and regular rhythm.   Pulmonary/Chest: Effort normal and breath sounds normal. No respiratory distress.  Abdominal: Soft. Bowel sounds are normal. She exhibits no distension and no mass. There is no tenderness. There is no rebound and no guarding.  Neg cvat Neg murphys sign  Genitourinary: Vaginal discharge found.  No cervix (s/p hysterectomy) but thin white discharge, no significant tenderness  Musculoskeletal: She exhibits no edema.  Pt very deconditioned, has to be assisted to be sit up, lay down, sit in pelvic bed, moans with any movement of her back  Neurological: She is alert and oriented to person, place, and time.  Skin: Skin is warm. No rash noted.  Psychiatric: She has a normal mood and affect.  Nursing note and vitals reviewed.   ED Course  Procedures (including critical care time) Labs Review Labs Reviewed  WET PREP, GENITAL - Abnormal; Notable for the following:    Clue Cells Wet Prep HPF POC PRESENT (*)    WBC, Wet Prep HPF POC FEW (*)    All other components within normal limits  LIPASE, BLOOD - Abnormal; Notable for the following:    Lipase 152 (*)    All other components within normal limits  COMPREHENSIVE METABOLIC PANEL - Abnormal; Notable for the following:    Sodium 134 (*)    Chloride 98 (*)    Glucose, Bld 502 (*)    BUN 22 (*)    Creatinine, Ser 1.12 (*)    GFR calc non Af Amer 55 (*)    All other components within normal limits  URINALYSIS, ROUTINE W REFLEX MICROSCOPIC (NOT AT [facility]) - Abnormal; Notable for the following:    Specific Gravity, Urine 1.034 (*)    Glucose, UA >1000 (*)    Hgb urine dipstick TRACE (*)    Ketones, ur 15 (*)    Leukocytes, UA TRACE (*)    All other components within normal limits  URINE MICROSCOPIC-ADD ON - Abnormal; Notable for the following:    Squamous Epithelial / LPF 0-5 (*)    All other components within normal limits  CBG MONITORING, ED -  Abnormal; Notable for the following:    Glucose-Capillary 534 (*)    All other components within normal limits  URINE CULTURE  CBC  TSH  H. PYLORI ANTIBODY, IGG  CBG MONITORING, ED  GC/CHLAMYDIA PROBE AMP ([place]) NOT AT [facility]  WET PREP  (BD AFFIRM) ([place])    Imaging Review No results found. I have personally reviewed and evaluated these images and lab results as part of my medical decision-making.   EKG Interpretation   Date/Time:  Friday February 04 2016 21:00:23 EDT Ventricular Rate:  93 PR Interval:  149 QRS Duration: 90 QT Interval:  368 QTC Calculation: 458 R Axis:   41 Text Interpretation:  Sinus rhythm Borderline low voltage, extremity leads  Minimal ST elevation, inferior leads Confirmed by Hazle Coca [phone]) on  02/04/2016 9:02:33 PM      MDM   Final diagnoses:  Epigastric pain  Yeast infection   Labs reviewed - UA without evidence of infection - will send for culture given dysuria and mild leuks but doubt stone as no sig blood. lipase is improving from prior (recently pancreatitis), CMP with elevated flucose at 502 but no anion gap, Cr wnls, CBC without significant leukocytoisis. Pt has multiple prior abdominal surgeries, recent CT in May when pt had pancreatitis. Pt has had gb removed but still has appendix However, abdominal exam reassuring and benign. Patient likely has gastritis, doubt esophageal pathology or significant ulcer, doubt perforation. Patient has very musculoskeletal reproducible positional pain on her back but no numbness, weakness, saddle anesthesia. Urinary incontinence is at baseline. Doubt cauda equina or any significant back abnormality. EKG without signs of acute ischemia doubt ACS. No lightheadedness to suggest AAA. Patient treated for her elevated glucose and will be given for condoms all for her yeast infection which is likely call seeing her dysuria. Patient instructed to have more close control of her sugars. Social work consult placed  as pt has difficulty affording medications. Patient to follow up with PCP regarding H pylori and GC chlamydia. Return for severe worsening abdominal pain with intractable vomiting, chest pain, lightheadedness, or any other concerning symptoms for an emergency.   Karma Greaser, MD 02/05/16 Benancio Deeds  Quintella Reichert, MD 02/08/16 1538

## 2016-02-04 NOTE — Telephone Encounter (Signed)
Pt. Called requesting her lab results. Please f/u with pt.  °

## 2016-02-04 NOTE — ED Notes (Signed)
Pt. Reports that she has severe abdominal burning after eating and the pain radiates into her lt. Flank.  Pt. Is having dysuria .  Pt. Is having nausea denies any vomiting.  Pt. Is weak and fatigued.  Skin is warm and dry.  Alert and oriented. X4

## 2016-02-05 ENCOUNTER — Emergency Department (HOSPITAL_COMMUNITY): Payer: Medicaid - Out of State

## 2016-02-05 ENCOUNTER — Encounter (HOSPITAL_COMMUNITY): Payer: Self-pay | Admitting: Radiology

## 2016-02-05 DIAGNOSIS — E876 Hypokalemia: Secondary | ICD-10-CM

## 2016-02-05 DIAGNOSIS — E869 Volume depletion, unspecified: Secondary | ICD-10-CM | POA: Diagnosis present

## 2016-02-05 DIAGNOSIS — N760 Acute vaginitis: Secondary | ICD-10-CM

## 2016-02-05 DIAGNOSIS — R8290 Unspecified abnormal findings in urine: Secondary | ICD-10-CM | POA: Diagnosis not present

## 2016-02-05 DIAGNOSIS — J189 Pneumonia, unspecified organism: Secondary | ICD-10-CM | POA: Diagnosis present

## 2016-02-05 DIAGNOSIS — E1040 Type 1 diabetes mellitus with diabetic neuropathy, unspecified: Secondary | ICD-10-CM | POA: Diagnosis not present

## 2016-02-05 DIAGNOSIS — B9689 Other specified bacterial agents as the cause of diseases classified elsewhere: Secondary | ICD-10-CM | POA: Diagnosis present

## 2016-02-05 DIAGNOSIS — E860 Dehydration: Secondary | ICD-10-CM | POA: Diagnosis present

## 2016-02-05 LAB — CBG MONITORING, ED
Glucose-Capillary: 401 mg/dL — ABNORMAL HIGH (ref 65–99)
Glucose-Capillary: 431 mg/dL — ABNORMAL HIGH (ref 65–99)
Glucose-Capillary: 536 mg/dL — ABNORMAL HIGH (ref 65–99)

## 2016-02-05 LAB — RAPID URINE DRUG SCREEN, HOSP PERFORMED
Amphetamines: NOT DETECTED
Barbiturates: NOT DETECTED
Benzodiazepines: NOT DETECTED
Cocaine: NOT DETECTED
Opiates: POSITIVE — AB
Tetrahydrocannabinol: NOT DETECTED

## 2016-02-05 LAB — GLUCOSE, CAPILLARY
Glucose-Capillary: 402 mg/dL — ABNORMAL HIGH (ref 65–99)
Glucose-Capillary: 403 mg/dL — ABNORMAL HIGH (ref 65–99)
Glucose-Capillary: 335 mg/dL — ABNORMAL HIGH (ref 65–99)
Glucose-Capillary: 327 mg/dL — ABNORMAL HIGH (ref 65–99)

## 2016-02-05 LAB — GAMMA GT: GGT: 25 U/L (ref 7–50)

## 2016-02-05 LAB — I-STAT CG4 LACTIC ACID, ED: Lactic Acid, Venous: 0.92 mmol/L (ref 0.5–2.0)

## 2016-02-05 LAB — I-STAT TROPONIN, ED: Troponin i, poc: 0 ng/mL (ref 0.00–0.08)

## 2016-02-05 LAB — STREP PNEUMONIAE URINARY ANTIGEN: Strep Pneumo Urinary Antigen: NEGATIVE

## 2016-02-05 LAB — BRAIN NATRIURETIC PEPTIDE: B Natriuretic Peptide: 16.8 pg/mL (ref 0.0–100.0)

## 2016-02-05 MED ORDER — NICOTINE 21 MG/24HR TD PT24
21.0000 mg | MEDICATED_PATCH | Freq: Every day | TRANSDERMAL | Status: DC
  Administered 2016-02-05 – 2016-02-08 (×4): 21 mg via TRANSDERMAL
  Filled 2016-02-05 (×4): qty 1

## 2016-02-05 MED ORDER — KETOROLAC TROMETHAMINE 30 MG/ML IJ SOLN
30.0000 mg | Freq: Once | INTRAMUSCULAR | Status: AC
  Administered 2016-02-05: 30 mg via INTRAVENOUS
  Filled 2016-02-05: qty 1

## 2016-02-05 MED ORDER — INSULIN GLARGINE 100 UNIT/ML ~~LOC~~ SOLN
50.0000 [IU] | Freq: Two times a day (BID) | SUBCUTANEOUS | Status: DC
  Administered 2016-02-05 – 2016-02-08 (×6): 50 [IU] via SUBCUTANEOUS
  Filled 2016-02-05 (×7): qty 0.5

## 2016-02-05 MED ORDER — OXYCODONE HCL 5 MG PO TABS
5.0000 mg | ORAL_TABLET | Freq: Two times a day (BID) | ORAL | Status: DC | PRN
  Administered 2016-02-05 – 2016-02-08 (×6): 5 mg via ORAL
  Filled 2016-02-05 (×6): qty 1

## 2016-02-05 MED ORDER — PHENAZOPYRIDINE HCL 200 MG PO TABS: 200.0000 mg | ORAL_TABLET | Freq: Three times a day (TID) | ORAL | Status: DC

## 2016-02-05 MED ORDER — SODIUM CHLORIDE 0.9 % IV BOLUS (SEPSIS)
1000.0000 mL | Freq: Once | INTRAVENOUS | Status: AC
  Administered 2016-02-05: 1000 mL via INTRAVENOUS

## 2016-02-05 MED ORDER — VANCOMYCIN HCL IN DEXTROSE 1-5 GM/200ML-% IV SOLN
1000.0000 mg | Freq: Two times a day (BID) | INTRAVENOUS | Status: DC
  Administered 2016-02-05 – 2016-02-07 (×5): 1000 mg via INTRAVENOUS
  Filled 2016-02-05 (×6): qty 200

## 2016-02-05 MED ORDER — INSULIN ASPART 100 UNIT/ML ~~LOC~~ SOLN
0.0000 [IU] | Freq: Three times a day (TID) | SUBCUTANEOUS | Status: DC
  Administered 2016-02-05: 7 [IU] via SUBCUTANEOUS
  Administered 2016-02-05 (×2): 9 [IU] via SUBCUTANEOUS
  Administered 2016-02-06: 5 [IU] via SUBCUTANEOUS
  Administered 2016-02-06: 7 [IU] via SUBCUTANEOUS
  Administered 2016-02-06 – 2016-02-07 (×2): 3 [IU] via SUBCUTANEOUS
  Administered 2016-02-07: 5 [IU] via SUBCUTANEOUS
  Administered 2016-02-07: 1 [IU] via SUBCUTANEOUS

## 2016-02-05 MED ORDER — PANTOPRAZOLE SODIUM 40 MG PO TBEC
40.0000 mg | DELAYED_RELEASE_TABLET | Freq: Every day | ORAL | Status: DC
  Administered 2016-02-05 – 2016-02-08 (×4): 40 mg via ORAL
  Filled 2016-02-05 (×4): qty 1

## 2016-02-05 MED ORDER — METRONIDAZOLE IN NACL 5-0.79 MG/ML-% IV SOLN
500.0000 mg | Freq: Three times a day (TID) | INTRAVENOUS | Status: DC
  Administered 2016-02-05 – 2016-02-06 (×3): 500 mg via INTRAVENOUS
  Filled 2016-02-05 (×3): qty 100

## 2016-02-05 MED ORDER — POTASSIUM CHLORIDE IN NACL 20-0.9 MEQ/L-% IV SOLN
INTRAVENOUS | Status: DC
  Administered 2016-02-05 (×2): 1000 mL via INTRAVENOUS
  Administered 2016-02-06: 03:00:00 via INTRAVENOUS
  Administered 2016-02-06 – 2016-02-07 (×2): 1000 mL via INTRAVENOUS
  Filled 2016-02-05 (×11): qty 1000

## 2016-02-05 MED ORDER — DEXTROSE 5 % IV SOLN: 2.0000 g | Freq: Three times a day (TID) | INTRAVENOUS | Status: DC

## 2016-02-05 MED ORDER — ACETAMINOPHEN 325 MG PO TABS
650.0000 mg | ORAL_TABLET | Freq: Four times a day (QID) | ORAL | Status: DC | PRN
  Administered 2016-02-05 – 2016-02-06 (×2): 650 mg via ORAL
  Filled 2016-02-05 (×4): qty 2

## 2016-02-05 MED ORDER — CYCLOBENZAPRINE HCL 10 MG PO TABS: 10.0000 mg | ORAL_TABLET | Freq: Two times a day (BID) | ORAL | Status: DC | PRN

## 2016-02-05 MED ORDER — DEXTROSE 5 % IV SOLN: 1.0000 g | Freq: Once | INTRAVENOUS | Status: DC

## 2016-02-05 MED ORDER — INSULIN GLARGINE 100 UNIT/ML ~~LOC~~ SOLN: 50.0000 [IU] | Freq: Two times a day (BID) | SUBCUTANEOUS | Status: DC

## 2016-02-05 MED ORDER — INSULIN ASPART 100 UNIT/ML ~~LOC~~ SOLN
5.0000 [IU] | Freq: Three times a day (TID) | SUBCUTANEOUS | Status: DC
  Administered 2016-02-05 – 2016-02-08 (×9): 5 [IU] via SUBCUTANEOUS

## 2016-02-05 MED ORDER — INSULIN GLARGINE 100 UNIT/ML ~~LOC~~ SOLN
40.0000 [IU] | Freq: Once | SUBCUTANEOUS | Status: AC
  Administered 2016-02-05: 40 [IU] via SUBCUTANEOUS
  Filled 2016-02-05: qty 0.4

## 2016-02-05 MED ORDER — ATORVASTATIN CALCIUM 40 MG PO TABS
40.0000 mg | ORAL_TABLET | Freq: Every day | ORAL | Status: DC
  Administered 2016-02-05 – 2016-02-07 (×3): 40 mg via ORAL
  Filled 2016-02-05 (×3): qty 1

## 2016-02-05 MED ORDER — IOPAMIDOL (ISOVUE-370) INJECTION 76%
INTRAVENOUS | Status: AC
  Administered 2016-02-05: 50 mL
  Filled 2016-02-05: qty 100

## 2016-02-05 MED ORDER — OXYCODONE HCL 5 MG PO TABS
5.0000 mg | ORAL_TABLET | Freq: Once | ORAL | Status: AC
  Administered 2016-02-06: 5 mg via ORAL
  Filled 2016-02-05: qty 1

## 2016-02-05 MED ORDER — DEXTROSE 5 % IV SOLN
2.0000 g | Freq: Three times a day (TID) | INTRAVENOUS | Status: DC
  Administered 2016-02-05 – 2016-02-07 (×7): 2 g via INTRAVENOUS
  Filled 2016-02-05 (×9): qty 2

## 2016-02-05 MED ORDER — INSULIN ASPART 100 UNIT/ML ~~LOC~~ SOLN
0.0000 [IU] | Freq: Every day | SUBCUTANEOUS | Status: DC
  Administered 2016-02-05: 4 [IU] via SUBCUTANEOUS
  Administered 2016-02-06 – 2016-02-07 (×2): 3 [IU] via SUBCUTANEOUS

## 2016-02-05 MED ORDER — ENOXAPARIN SODIUM 40 MG/0.4ML ~~LOC~~ SOLN
40.0000 mg | SUBCUTANEOUS | Status: DC
  Administered 2016-02-05 – 2016-02-07 (×3): 40 mg via SUBCUTANEOUS
  Filled 2016-02-05 (×4): qty 0.4

## 2016-02-05 NOTE — Progress Notes (Signed)
Patient complaining of back pain; MD notified and an order for Tylenol 650 was given. Patient refused Tylenol, saying that is not helping at all and she wants something stronger than Tylenol; patient very upset, yelling at staff. MD informed, but no new order was placed. Patient was advised to try first Tylenol and if it is not working we can try something stronger, but patient refused. MD notified. Will continue to monitor.

## 2016-02-05 NOTE — Progress Notes (Signed)
Patient trasfered from ED to [phone] via stretcher; alert and oriented x 4; no complaints of abdominal pain - tolerated; IV saline locked in RAC and R hand fluids running in right wrist; skin intact. Orient patient to room and unit; gave patient care guide; instructed how to use the call bell and  fall risk precautions. Will continue to monitor the patient.

## 2016-02-05 NOTE — ED Notes (Signed)
Pt acknowledges taking all belongings upstairs with her.

## 2016-02-05 NOTE — ED Notes (Signed)
Pt reports running low on insulin at home; unable to afford insulin prescriptions

## 2016-02-05 NOTE — H&P (Signed)
History and Physical    Barbara Wang [id number] DOB: 07-31-1966 DOA: 02/04/2016   PCP: Default, Provider, MD Althia Forts Patient coming from/Resides with: Private residence in [place] and periodically returns to [place], [place] to visit her boyfriend-currently has been here nearly 30 days  Chief Complaint: Abdominal pain and generalized weakness  HPI: Barbara Wang is a 50 y.o. female with medical history significant for hypertension, diabetes on insulin, peripheral neuropathy secondary to diabetes, obesity and recent admission for idiopathic pancreatitis. She initially presented to the hospital for abdominal pain and tachycardia. After treatment in the ER her tachycardia persisted and her CBGs remained elevated. Her care was turned over to be on coming EDP who noted the patient had developed new borderline hypoxemic with room air saturations between 91-93% at rest and given her persistent tachycardia and recent hospitalization concerns were for pulmonary embolism. CT of the chest showed no evidence of PE athough revealed early mild bibasilar pneumonia processes. Her lactic acid was normal and she was empirically given vancomycin and aztreonam.  In discussion with the patient she states she has been having some abdominal pain since discharge but it never resolved. She is had generalized weakness with persistent nausea. Her CBGs had been better controlled in the hospital but have been increasing since discharge although it appears the patient was also not able to afford her Lantus and apparently was not taking her insulins as prescribed. She also has sliding scale insulin per her med rec. She has not had any vomiting or diarrhea. She does report that she's been very tired with walking and a little short winded but she thought this was because she had recently been hospitalized. She's not had any cough fevers or chills.  ED Course:  Initial vital signs: PO 98.7-BP  112/71-pulse 98-respirations 18-room air saturations 96% Follow-up Vital signs: PO 98.1-BP 121/78-pulse 124-respirations 22-room air saturations 91-93% CT Angio of the chest (PE protocol): No evidence of pulmonary embolism but mild patchy central opacities in the lung bases concerning for mild pneumonia Lab data: Sodium 134, potassium 4.0, BUN 22 creatinine 1.12, glucose 502, lipase 152, LFTs normal, WBC 7000, hemoglobin 10.5, platelets 333,000; urinalysis unremarkable except for greater than 1000 of glucose, 15 of ketones, trace leukocytes, specific gravity elevated at 1.034-positive yeast in 6-30 WBC Zofran 4 mg sublingual tablet 1 Mylanta 15 mL 1 Viscous lidocaine 15 mL 1 Normal saline bolus 2 L Flexeril 5 mg by mouth 1 Tylenol 1000 mg by mouth 1 Regular insulin 8 units IV 1 Diflucan 150 mg by mouth 1 Lantus 40 units subcutaneous 1 Toradol 30 mg IV 1 Aztreonam 2 g IV 1 Vancomycin 1000 mg IV 1  Review of Systems:  In addition to the HPI above,  No Fever-chills No Headache, changes with Vision or hearing, new weakness, tingling, numbness in any extremity, No problems swallowing food or Liquids, indigestion/reflux No Chest pain, Cough, palpitations, orthopnea or DOE No melena or hematochezia, no dark tarry stools No dysuria, hematuria or flank pain No new skin rashes, lesions, masses or bruises, No new joints pains-aches No recent weight gain or loss No polyuria, polydypsia or polyphagia,   Past Medical History  Diagnosis Date  . Hypertension   . Pancreatitis   . Hyperlipemia   . History of hiatal hernia     "it went away on it's own"  . Depression   . Cellulitis 06/2015    rt hand   . Asthma   . 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 Neuropathy ([place])      "feet & hands "  . Obesity     Past Surgical History  Procedure Laterality Date  . Cesarean section  1981; 1987  . Shoulder arthroscopy w/ rotator cuff repair Right   .  Cholecystectomy  05/11/2012    Procedure: LAPAROSCOPIC CHOLECYSTECTOMY WITH INTRAOPERATIVE CHOLANGIOGRAM;  Surgeon: Gayland Curry, MD,FACS;  Location: [facility];  Service: General;  Laterality: N/A;  . Abdominal hysterectomy    . Tubal ligation  10/25/1999    Archie Endo 01/11/2011    Social History   Social History  . Marital Status: Married    Spouse Name: N/A  . Number of Children: N/A  . Years of Education: N/A   Occupational History  . Not on file.   Social History Main Topics  . Smoking status: Current Every Day Smoker -- 0.50 packs/day for 40 years    Types: Cigarettes  . Smokeless tobacco: Never Used  . Alcohol Use: No  . Drug Use: No  . Sexual Activity: Not Currently    Birth Control/ Protection: Surgical   Other Topics Concern  . Not on file   Social History Narrative    Mobility: Without assistive devices Work history: Unemployed, previously worked at an [employer], corporate in [place]  . Penicillins Hives and Shortness Of Breath    Has patient had a PCN reaction causing immediate rash, facial/tongue/throat swelling, SOB or lightheadedness with hypotension: Yes Has patient had a PCN reaction causing severe rash involving mucus membranes or skin necrosis: No Has patient had a PCN reaction that required hospitalization No Has patient had a PCN reaction occurring within the last 10 years: No If all of the above answers are "NO", then may proceed with Cephalosporin use.  . Azithromycin Hives  . Sulfa Antibiotics Hives  . Tramadol Hives    Family history reviewed and not pertinent to current admission symptoms   Prior to Admission medications   Medication Sig Start Date End Date Taking? Authorizing Provider  acetaminophen (TYLENOL) 650 MG CR tablet Take 1,300 mg by mouth every 4 (four) hours.   Yes Historical Provider, MD  acetaminophen-codeine (TYLENOL #3) 300-30 MG tablet Take 1 tablet by mouth every 4 (four) hours as needed for  moderate pain. 02/02/16  Yes Lisabeth Pick, MD  atorvastatin (LIPITOR) 40 MG tablet Take 40 mg by mouth daily at 6 PM.    Yes Historical Provider, MD  insulin glargine (LANTUS) 100 UNIT/ML injection Inject 0.5 mLs (50 Units total) into the skin daily. Patient taking differently: Inject 50 Units into the skin 2 (two) times daily.  07/06/15  Yes Sela Hua, MD  insulin regular (HUMULIN R) 100 units/mL injection Inject 5-25 Units into the skin 3 (three) times daily. 03/22/14  Yes Historical Provider, MD  lisinopril-hydrochlorothiazide (PRINZIDE,ZESTORETIC) 10-12.5 MG tablet Take 1 tablet by mouth daily. Patient taking differently: Take 1 tablet by mouth at bedtime.  01/15/16  Yes Bjorn Pippin, PA-C  pantoprazole (PROTONIX) 40 MG tablet Take 1 tablet (40 mg total) by mouth daily. 02/02/16  Yes Lisabeth Pick, MD  cyclobenzaprine (FLEXERIL) 10 MG tablet Take 1 tablet (10 mg total) by mouth 2 (two) times daily as needed for muscle spasms. 02/05/16   Karma Greaser, MD  phenazopyridine (PYRIDIUM) 200 MG tablet Take 1 tablet (200 mg total) by mouth 3 (three) times daily. 02/05/16   Karma Greaser, MD    Physical  Exam: Filed Vitals:   02/05/16 0730 02/05/16 0744 02/05/16 0800 02/05/16 0839  BP: 134/95  137/94 123/65  Pulse: 120 117 119 76  Temp:    97.8 F (36.6 C)  TempSrc:      Resp: 22 20 18 20   Height:      Weight:      SpO2: 92% 93% 95% 100%      Constitutional: NAD, calm, comfortable Eyes: PERRL, lids and conjunctivae normal ENMT: Mucous membranes are moist. Posterior pharynx clear of any exudate or lesions.Normal dentition.  Neck: normal, supple, no masses, no thyromegaly Respiratory: clear to auscultation bilaterallySomewhat decreased in the bases, no wheezing, no crackles. Normal respiratory effort. No accessory muscle use. O2 sats greater than 92% at rest on room air  Cardiovascular: Regular rate and rhythm, no murmurs / rubs / gallops. No extremity edema. 2+ pedal pulses.  No carotid bruits.  Abdomen: no tenderness appreciated with palpation, no masses palpated. No hepatosplenomegaly. Bowel sounds positive.  Musculoskeletal: no clubbing / cyanosis. No joint deformity upper and lower extremities. Good ROM, no contractures. Normal muscle tone.  Skin: no rashes, lesions, ulcers. No induration Neurologic: CN 2-12 grossly intact. Sensation intact, DTR normal. Strength 5/5 x all 4 extremities.  Psychiatric: Normal judgment and insight. Alert and oriented x 3. Normal mood.    Labs on Admission: I have personally reviewed following labs and imaging studies  CBC:  Recent Labs Lab 02/02/16 1443 02/04/16 1753  WBC 7.5 7.0  NEUTROABS 4875  --   HGB 15.2 14.5  HCT 45.2* 43.2  MCV 85.4 85.9  PLT 433* 0000000   Basic Metabolic Panel:  Recent Labs Lab 02/02/16 1443 02/04/16 1753  NA 137 134*  K 4.1 4.0  CL 100 98*  CO2 25 27  GLUCOSE 266* 502*  BUN 24 22*  CREATININE 0.92 1.12*  CALCIUM 10.0 9.6   GFR: Estimated Creatinine Clearance: 62.8 mL/min (by C-G formula based on Cr of 1.12). Liver Function Tests:  Recent Labs Lab 02/02/16 1443 02/04/16 1753  AST 14 18  ALT 17 22  ALKPHOS 109 99  BILITOT 0.4 0.8  PROT 7.7 7.7  ALBUMIN 4.1 3.6    Recent Labs Lab 02/02/16 1443 02/04/16 1753  LIPASE 1894* 152*   No results for input(s): AMMONIA in the last 168 hours. Coagulation Profile: No results for input(s): INR, PROTIME in the last 168 hours. Cardiac Enzymes: No results for input(s): CKTOTAL, CKMB, CKMBINDEX, TROPONINI in the last 168 hours. BNP (last 3 results) No results for input(s): PROBNP in the last 8760 hours. HbA1C: No results for input(s): HGBA1C in the last 72 hours. CBG:  Recent Labs Lab 02/04/16 2258 02/05/16 0053 02/05/16 0326 02/05/16 0643 02/05/16 0835  GLUCAP 534* 536* 431* 401* 402*   Lipid Profile: No results for input(s): CHOL, HDL, LDLCALC, TRIG, CHOLHDL, LDLDIRECT in the last 72 hours. Thyroid Function  Tests:  Recent Labs  02/04/16 2108  TSH 0.428   Anemia Panel: No results for input(s): VITAMINB12, FOLATE, FERRITIN, TIBC, IRON, RETICCTPCT in the last 72 hours. Urine analysis:    Component Value Date/Time   COLORURINE YELLOW 02/04/2016 1839   APPEARANCEUR CLEAR 02/04/2016 1839   LABSPEC 1.034* 02/04/2016 1839   PHURINE 5.5 02/04/2016 1839   GLUCOSEU >1000* 02/04/2016 1839   HGBUR TRACE* 02/04/2016 [place] NEGATIVE 02/04/2016 1839   BILIRUBINUR negative 02/02/2016 1600   KETONESUR 15* 02/04/2016 1839   PROTEINUR NEGATIVE 02/04/2016 1839   PROTEINUR trace 02/02/2016 1600   UROBILINOGEN  0.2 02/02/2016 1600   UROBILINOGEN 0.2 01/15/2016 1524   NITRITE NEGATIVE 02/04/2016 1839   NITRITE negative 02/02/2016 1600   LEUKOCYTESUR TRACE* 02/04/2016 1839   Sepsis Labs: @LABRCNTIP (procalcitonin:4,lacticidven:4) ) Recent Results (from the past 240 hour(s))  Urine culture     Status: None   Collection Time: 02/02/16  2:30 PM  Result Value Ref Range Status   Colony Count NO GROWTH  Final   Organism ID, Bacteria NO GROWTH  Final  Wet prep, genital     Status: Abnormal   Collection Time: 02/04/16 10:34 PM  Result Value Ref Range Status   Yeast Wet Prep HPF POC NONE SEEN NONE SEEN Final   Trich, Wet Prep NONE SEEN NONE SEEN Final   Clue Cells Wet Prep HPF POC PRESENT (A) NONE SEEN Final   WBC, Wet Prep HPF POC FEW (A) NONE SEEN Final   Sperm NONE SEEN  Final  Culture, blood (routine x 2)     Status: None (Preliminary result)   Collection Time: 02/05/16  6:51 AM  Result Value Ref Range Status   Specimen Description BLOOD RIGHT UPPER ARM  Final   Special Requests IN PEDIATRIC BOTTLE  4CC  Final   Culture PENDING  Incomplete   Report Status PENDING  Incomplete     Radiological Exams on Admission: Ct Angio Chest Pe W/cm &/or Wo Cm  02/05/2016  CLINICAL DATA:  Acute onset of fatigue, generalized weakness and tachycardia. Shortness of breath and chest pain. Initial  encounter. EXAM: CT ANGIOGRAPHY CHEST WITH CONTRAST TECHNIQUE: Multidetector CT imaging of the chest was performed using the standard protocol during bolus administration of intravenous contrast. Multiplanar CT image reconstructions and MIPs were obtained to evaluate the vascular anatomy. CONTRAST:  50 mL of Isovue 370 IV contrast COMPARISON:  Chest radiograph performed 07/13/2014 FINDINGS: There is no evidence of pulmonary embolus. Mild patchy central opacities are noted at the lung bases, concerning for mild infection. There is no evidence of pleural effusion or pneumothorax. No masses are identified; no abnormal focal contrast enhancement is seen. The mediastinum is unremarkable in appearance. No mediastinal lymphadenopathy is seen. No pericardial effusion is identified. The great vessels are grossly unremarkable in appearance. No axillary lymphadenopathy is seen. The visualized portions of the thyroid gland are unremarkable in appearance. The visualized portions of the liver and spleen are unremarkable. The patient is status post cholecystectomy, with clips noted at the gallbladder fossa. The visualized portions of the pancreas, adrenal glands and kidneys are within normal limits. No acute osseous abnormalities are seen. Review of the MIP images confirms the above findings. IMPRESSION: 1. No evidence of pulmonary embolus. 2. Mild patchy central opacities at the lung bases, concerning for mild pneumonia. Electronically Signed   By: Garald Balding M.D.   On: 02/05/2016 05:12    EKG: (Independently reviewed) sinus tachycardia, ventricular rate 133 bpm, QTC 466 ms, no evidence of ischemic changes  Assessment/Plan Principal Problem:   HCAP (healthcare-associated pneumonia) -Patient presented with abdominal pain generalized malaise and some shortness of breath with activity is being hospitalized recently with borderline hypoxia and CT findings concerning for early bibasilar pneumonia -No leukocytosis, fevers  or chills but will presumptively treat for healthcare acquired pneumonia -Check ambulatory pulse oximetry on room air to confirm definitive hypoxemia -Follow up on blood cultures -Check urinary strep and Legionella -HIV per protocol  Active Problems:   Diabetes mellitus, insulin dependent (IDDM), uncontrolled  -Likely related to underlying infectious processes as well as noncompliance with medication regimens  due to inability to obtain medications while temp rarely residing out of state -Patient apparently has Medicaid in [place] but reports is unable to afford Lantus here in [place] -Case management consulted to assist with obtaining medications while residing in [place] -Patient reported to attending physician she will immediately return to [place] once discharged -Check hemoglobin A1c -Continue reported Lantus dosage while here -SSI -Did have ketones in urine but anion gap was normal    Volume depletion -Appears to be multifactorial: ongoing abdominal pain with poor oral intake, hyperglycemia, and concomitant use of antihypertensive medication with thiazide diuretic -Specific gravity on urine elevated at 1.034 -BUN stable but creatinine slightly elevated from a baseline of 0.92 to 1.12 -Received 2 liters fluid in ER and will continue IV fluids at 1 25 mL per hour for now -Electrolyte panel in the morning -Given tachycardia we'll also check urine drug screen to rule out substances such as cocaine -With recent pancreatitis lipase currently has decreased to 152 from a peak of 1800, check GGT to ensure no alcohol component although patient denies alcohol usage but does admit to tobacco abuse    HTN (hypertension) -Currently controlled -Given dehydration and borderline acute kidney injury will hold preadmission ACE inhibitor (lisinopril)    Abnormal urinalysis -Appears consistent with UTI and could explain patient's continued issues with nonspecific abdominal pain and  nausea -Check urine culture and follow-up on blood cultures -Empiric antibiotics for HCAP should cover urinary processes until culture resulted    Bacterial vaginosis -Noted on wet prep in ER -Patient asymptomatic -Given nausea will treat initially with IV Flagyl   Tobacco abuse -Nicotine patch      DVT prophylaxis: Lovenox Code Status: Full code Family Communication: Boyfriend at bedside but is asleep Disposition Plan: Anticipate discharge back to [facility] once medically stable Consults called: None Admission status: Observation/telemetry     Ortencia Askari L. ANP-BC [facility] Pager [phone]   If 7PM-7AM, please contact night-coverage [email or web address] Password [email or web address]  02/05/2016, 8:45 AM

## 2016-02-05 NOTE — Progress Notes (Signed)
Patient asking again for pain but she refused again Tylenol. Patient said that she was before on oxycodone and then when she want to wellness clinic oxycodone was changed to Tylenol #3. Patient said Tylenol is not helping and she asked to talk with her doctor. MD was notified. Will continue to monitor.

## 2016-02-05 NOTE — ED Provider Notes (Signed)
This patient was evaluated earlier in the evening by Drs. Jarold Song and Ralene Bathe after she presented with abdominal pain. Recent hospitalization for pancreatitis. She had been discharged and was awaiting fluid bolus. I was contacted by the nurse because her blood glucose remained in the 500s with no improvement after treatments. She was also noted to be tachycardic and mildly hypoxic, 91-93% on room air. EKG shows sinus tachycardia without ischemic changes. Obtained troponin which was negative. Because of patient's recent hospitalization, obtained CTA of chest to rule out PE. CTA was negative for PE but does show early pneumonia. Gave the patient vancomycin and aztreonam for HCAP coverage. Pt admitted to hospitalist, Dr. Eliseo Squires, discussed with Ebony Hail.  Sharlett Iles, MD 02/05/16 [phone]

## 2016-02-05 NOTE — Care Management Note (Signed)
Case Management Note  Patient Details  Name: Barbara Wang MRN: [id number] Date of Birth: 1966-03-25  Subjective/Objective:                 Presents with abd pain, generalized weakness, history of HTN, DM with neuropathy. She lives in [place] and has medicaid. She comes to [facility] periodically to see family/ boyfriend and has run out of her diabetes medications. She says she is unable to fill them here as her medicaid does not work. She was recently hospitalized in late May for pancreatitis. Independent to ADL's pta, no DME usage. States active with the [facility].  Action/Plan: Plan is to d/c to home when medically stable. CM to f/u with d/c disposition.  Expected Discharge Date:                  Expected Discharge Plan:  Home/Self Care Mallie Snooks, fiance)  In-House Referral:     Discharge planning Services  CM Consult  Post Acute Care Choice:    Choice offered to:     DME Arranged:    DME Agency:     HH Arranged:    HH Agency:     Status of Service:  In process, will continue to follow  Medicare Important Message Given:    Date Medicare IM Given:    Medicare IM give by:    Date Additional Medicare IM Given:    Additional Medicare Important Message give by:     If discussed at [place] of Stay Meetings, dates discussed:    Additional Comments: Saara Cabanilla (Mother)  [phone]  [address] [place], [place] [phone] 02/05/2016, 10:21 AM

## 2016-02-05 NOTE — ED Notes (Signed)
IV team at bedside 

## 2016-02-05 NOTE — Progress Notes (Signed)
Pharmacy Antibiotic Note  Barbara Wang is a 50 y.o. female admitted on 02/04/2016 with pneumonia.  Pharmacy has been consulted for Vancomycin/Aztreonam dosing. CT Angio concerning for PNA. WBC WNL. Renal function ok.   Plan: -Vancomycin 1000 mg IV q12h -Aztreonam 2g IV q8h -Trend WBC, temp, renal function  -Drug levels at steady state  Height: 5\' 4"  (162.6 cm) Weight: 192 lb (87.091 kg) IBW/kg (Calculated) : 54.7  Temp (24hrs), Avg:98.4 F (36.9 C), Min:98.1 F (36.7 C), Max:98.7 F (37.1 C)   Recent Labs Lab 02/02/16 1443 02/04/16 1753  WBC 7.5 7.0  CREATININE 0.92 1.12*    Estimated Creatinine Clearance: 62.8 mL/min (by C-G formula based on Cr of 1.12).    Allergies  Allergen Reactions  . Penicillins Hives and Shortness Of Breath    Has patient had a PCN reaction causing immediate rash, facial/tongue/throat swelling, SOB or lightheadedness with hypotension: Yes Has patient had a PCN reaction causing severe rash involving mucus membranes or skin necrosis: No Has patient had a PCN reaction that required hospitalization No Has patient had a PCN reaction occurring within the last 10 years: No If all of the above answers are "NO", then may proceed with Cephalosporin use.  . Azithromycin Hives  . Sulfa Antibiotics Hives  . Tramadol Hives    Narda Bonds 02/05/2016 6:28 AM

## 2016-02-05 NOTE — Progress Notes (Signed)
Patient demanding oxycodone as given during previous hospitalization for her chronic pain.  She is "allergic" to tramadol.  Would avoid IV narcotics and WOULD NOT PRESCRIBE PO NARCOTICS AT DISCHARGE. Eulogio Bear

## 2016-02-05 NOTE — ED Notes (Signed)
Pt noted to be tachycardic on the pulse ox; cardiac monitor applied showing sinus tach. Dr.Little made aware

## 2016-02-05 NOTE — ED Notes (Signed)
Ct contacted for new IV placement

## 2016-02-06 DIAGNOSIS — N760 Acute vaginitis: Secondary | ICD-10-CM | POA: Diagnosis present

## 2016-02-06 DIAGNOSIS — K2970 Gastritis, unspecified, without bleeding: Secondary | ICD-10-CM | POA: Diagnosis present

## 2016-02-06 DIAGNOSIS — E860 Dehydration: Secondary | ICD-10-CM | POA: Diagnosis present

## 2016-02-06 DIAGNOSIS — N390 Urinary tract infection, site not specified: Secondary | ICD-10-CM | POA: Diagnosis present

## 2016-02-06 DIAGNOSIS — E1142 Type 2 diabetes mellitus with diabetic polyneuropathy: Secondary | ICD-10-CM | POA: Diagnosis present

## 2016-02-06 DIAGNOSIS — E1065 Type 1 diabetes mellitus with hyperglycemia: Secondary | ICD-10-CM | POA: Diagnosis not present

## 2016-02-06 DIAGNOSIS — E1165 Type 2 diabetes mellitus with hyperglycemia: Secondary | ICD-10-CM | POA: Diagnosis present

## 2016-02-06 DIAGNOSIS — Z794 Long term (current) use of insulin: Secondary | ICD-10-CM | POA: Diagnosis not present

## 2016-02-06 DIAGNOSIS — E785 Hyperlipidemia, unspecified: Secondary | ICD-10-CM | POA: Diagnosis present

## 2016-02-06 DIAGNOSIS — I10 Essential (primary) hypertension: Secondary | ICD-10-CM | POA: Diagnosis present

## 2016-02-06 DIAGNOSIS — J189 Pneumonia, unspecified organism: Principal | ICD-10-CM

## 2016-02-06 DIAGNOSIS — B379 Candidiasis, unspecified: Secondary | ICD-10-CM | POA: Diagnosis present

## 2016-02-06 DIAGNOSIS — E1140 Type 2 diabetes mellitus with diabetic neuropathy, unspecified: Secondary | ICD-10-CM | POA: Diagnosis present

## 2016-02-06 DIAGNOSIS — R1013 Epigastric pain: Secondary | ICD-10-CM | POA: Diagnosis not present

## 2016-02-06 DIAGNOSIS — F17210 Nicotine dependence, cigarettes, uncomplicated: Secondary | ICD-10-CM | POA: Diagnosis present

## 2016-02-06 DIAGNOSIS — E1040 Type 1 diabetes mellitus with diabetic neuropathy, unspecified: Secondary | ICD-10-CM | POA: Diagnosis not present

## 2016-02-06 DIAGNOSIS — N179 Acute kidney failure, unspecified: Secondary | ICD-10-CM | POA: Diagnosis present

## 2016-02-06 LAB — CBC WITH DIFFERENTIAL/PLATELET
Basophils Absolute: 0 10*3/uL (ref 0.0–0.1)
Basophils Relative: 0 %
Eosinophils Absolute: 0.2 10*3/uL (ref 0.0–0.7)
Eosinophils Relative: 3 %
HCT: 36 % (ref 36.0–46.0)
Hemoglobin: 11.6 g/dL — ABNORMAL LOW (ref 12.0–15.0)
Lymphocytes Relative: 24 %
Lymphs Abs: 1.6 10*3/uL (ref 0.7–4.0)
MCH: 27.9 pg (ref 26.0–34.0)
MCHC: 32.2 g/dL (ref 30.0–36.0)
MCV: 86.5 fL (ref 78.0–100.0)
Monocytes Absolute: 0.6 10*3/uL (ref 0.1–1.0)
Monocytes Relative: 10 %
Neutro Abs: 4.1 10*3/uL (ref 1.7–7.7)
Neutrophils Relative %: 63 %
Platelets: 284 10*3/uL (ref 150–400)
RBC: 4.16 MIL/uL (ref 3.87–5.11)
RDW: 12.6 % (ref 11.5–15.5)
WBC: 6.5 10*3/uL (ref 4.0–10.5)

## 2016-02-06 LAB — GLUCOSE, CAPILLARY
Glucose-Capillary: 312 mg/dL — ABNORMAL HIGH (ref 65–99)
Glucose-Capillary: 257 mg/dL — ABNORMAL HIGH (ref 65–99)
Glucose-Capillary: 207 mg/dL — ABNORMAL HIGH (ref 65–99)
Glucose-Capillary: 294 mg/dL — ABNORMAL HIGH (ref 65–99)

## 2016-02-06 LAB — URINE CULTURE

## 2016-02-06 LAB — COMPREHENSIVE METABOLIC PANEL
ALT: 13 U/L — ABNORMAL LOW (ref 14–54)
AST: 15 U/L (ref 15–41)
Albumin: 2.6 g/dL — ABNORMAL LOW (ref 3.5–5.0)
Alkaline Phosphatase: 80 U/L (ref 38–126)
Anion gap: 7 (ref 5–15)
BUN: 17 mg/dL (ref 6–20)
CO2: 25 mmol/L (ref 22–32)
Calcium: 8.6 mg/dL — ABNORMAL LOW (ref 8.9–10.3)
Chloride: 107 mmol/L (ref 101–111)
Creatinine, Ser: 0.98 mg/dL (ref 0.44–1.00)
GFR calc Af Amer: >60 mL/min (ref >60)
GFR calc non Af Amer: >60 mL/min (ref >60)
Glucose, Bld: 346 mg/dL — ABNORMAL HIGH (ref 65–99)
Potassium: 4.3 mmol/L (ref 3.5–5.1)
Sodium: 139 mmol/L (ref 135–145)
Total Bilirubin: 0.5 mg/dL (ref 0.3–1.2)
Total Protein: 5.8 g/dL — ABNORMAL LOW (ref 6.5–8.1)

## 2016-02-06 LAB — HIV ANTIBODY (ROUTINE TESTING W REFLEX): HIV Screen 4th Generation wRfx: NONREACTIVE

## 2016-02-06 LAB — LEGIONELLA PNEUMOPHILA SEROGP 1 UR AG: L. pneumophila Serogp 1 Ur Ag: NEGATIVE

## 2016-02-06 MED ORDER — FLUCONAZOLE 100 MG PO TABS
200.0000 mg | ORAL_TABLET | Freq: Once | ORAL | Status: AC
  Administered 2016-02-06: 200 mg via ORAL
  Filled 2016-02-06: qty 2

## 2016-02-06 MED ORDER — BENZOCAINE 10 % MT GEL
Freq: Four times a day (QID) | OROMUCOSAL | Status: DC | PRN
  Administered 2016-02-06 – 2016-02-07 (×2): 1 via OROMUCOSAL
  Filled 2016-02-06: qty 9.4

## 2016-02-06 MED ORDER — METRONIDAZOLE 500 MG PO TABS
500.0000 mg | ORAL_TABLET | Freq: Three times a day (TID) | ORAL | Status: DC
  Administered 2016-02-06 – 2016-02-07 (×3): 500 mg via ORAL
  Filled 2016-02-06 (×3): qty 1

## 2016-02-06 NOTE — Progress Notes (Signed)
Patient requesting a wheelchair to go outside.  I explained she has to stay on the unit. She agreed.

## 2016-02-06 NOTE — Progress Notes (Signed)
PROGRESS NOTE    GAYE ALLISON  [id number] DOB: 09/05/1965 DOA: 02/04/2016 PCP: Default, Provider, MD    Brief Narrative:Barbara Wang is a 50 y.o. female with a Past Medical History of HTN, DM with neuropathy presents with abdominal pain and generalized weakness. She was found to have developing pneumonia and appears to have run out of anti diabetic medications.   Assessment & Plan:   Principal Problem:   HCAP (healthcare-associated pneumonia) Active Problems:   HTN (hypertension)   Diabetes mellitus, insulin dependent (IDDM), uncontrolled ([place])   Bacterial vaginosis   Volume depletion   Abnormal urinalysis  Health Care associated pneumonia: Admitted to telemetry.  Started on broad spectrum antibiotics, and nac oxygen as needed.  PE was ruled out.  Blood cultures ordered and negative so far.   Abdominal pain: probably secondary to bacterial vaginosis vs UTI .  Non specific.  Liver fucntion panel is normal.  abd exam is benign.  Currently on flagyl for vaginosis and diflucan for yeast in the UTI.  Urine cultures are negative.    Dehydration/ generalized weakness: - gentle hydration and PT evaluation.   Uncontrolled diabetes mellitus: - hgba1c is pending.  CBG (last 3)   Recent Labs  02/06/16 0813 02/06/16 1202 02/06/16 1742  GLUCAP 312* 257* 207*    Restarted her lantus and SSI.  If her cbgs remain elevated, plan to add on novolog premeals.     DVT prophylaxis: (Lovenox/) Code Status: (Full) Family Communication: none at bedside.  Disposition Plan: pending further management, pending PT eval.    Consultants:   none   Procedures: none   Antimicrobials: vancomycin  Aztreonam   Flagyl bacterial vaginosis.   Diflucan yeast in the urine.    Subjective: Reports burning micturition.   Objective: Filed Vitals:   02/05/16 0846 02/05/16 1454 02/05/16 2118 02/06/16 0510  BP:  140/70 149/78 139/64  Pulse:  77 88 74  Temp:   97.6 F (36.4 C) 97.8 F (36.6 C) 98 F (36.7 C)  TempSrc:    Oral  Resp:  19 18 18   Height: 5\' 4"  (1.626 m)     Weight: 88.043 kg (194 lb 1.6 oz)     SpO2:   98% 98%    Intake/Output Summary (Last 24 hours) at 02/06/16 1515 Last data filed at 02/06/16 1430  Gross per 24 hour  Intake 4443.75 ml  Output      0 ml  Net 4443.75 ml   Filed Weights   02/04/16 1730 02/05/16 0846  Weight: 87.091 kg (192 lb) 88.043 kg (194 lb 1.6 oz)    Examination:  General exam: Appears calm and comfortable  Respiratory system: Clear to auscultation. Respiratory effort normal. Cardiovascular system: S1 & S2 heard, RRR. No JVD, murmurs, rubs, gallops or clicks. No pedal edema. Gastrointestinal system: Abdomen is nondistended, soft and nontender. No organomegaly or masses felt. Normal bowel sounds heard. Central nervous system: Alert and oriented. No focal neurological deficits. Extremities: Symmetric 5 x 5 power. Skin: No rashes, lesions or ulcers Psychiatry: Judgement and insight appear normal. Mood & affect appropriate.     Data Reviewed: I have personally reviewed following labs and imaging studies  CBC:  Recent Labs Lab 02/02/16 1443 02/04/16 1753 02/06/16 0537  WBC 7.5 7.0 6.5  NEUTROABS 4875  --  4.1  HGB 15.2 14.5 11.6*  HCT 45.2* 43.2 36.0  MCV 85.4 85.9 86.5  PLT 433* 333 [id number]   Basic Metabolic Panel:  Recent Labs Lab 02/02/16 1443  02/04/16 1753 02/06/16 0537  NA 137 134* 139  K 4.1 4.0 4.3  CL 100 98* 107  CO2 25 27 25   GLUCOSE 266* 502* 346*  BUN 24 22* 17  CREATININE 0.92 1.12* 0.98  CALCIUM 10.0 9.6 8.6*   GFR: Estimated Creatinine Clearance: 72.1 mL/min (by C-G formula based on Cr of 0.98). Liver Function Tests:  Recent Labs Lab 02/02/16 1443 02/04/16 1753 02/06/16 0537  AST 14 18 15   ALT 17 22 13*  ALKPHOS 109 99 80  BILITOT 0.4 0.8 0.5  PROT 7.7 7.7 5.8*  ALBUMIN 4.1 3.6 2.6*    Recent Labs Lab 02/02/16 1443 02/04/16 1753  LIPASE 1894*  152*   No results for input(s): AMMONIA in the last 168 hours. Coagulation Profile: No results for input(s): INR, PROTIME in the last 168 hours. Cardiac Enzymes: No results for input(s): CKTOTAL, CKMB, CKMBINDEX, TROPONINI in the last 168 hours. BNP (last 3 results) No results for input(s): PROBNP in the last 8760 hours. HbA1C: No results for input(s): HGBA1C in the last 72 hours. CBG:  Recent Labs Lab 02/05/16 1148 02/05/16 1702 02/05/16 2118 02/06/16 0813 02/06/16 1202  GLUCAP 403* 335* 327* 312* 257*   Lipid Profile: No results for input(s): CHOL, HDL, LDLCALC, TRIG, CHOLHDL, LDLDIRECT in the last 72 hours. Thyroid Function Tests:  Recent Labs  02/04/16 2108  TSH 0.428   Anemia Panel: No results for input(s): VITAMINB12, FOLATE, FERRITIN, TIBC, IRON, RETICCTPCT in the last 72 hours. Sepsis Labs:  Recent Labs Lab 02/05/16 0705  LATICACIDVEN 0.92    Recent Results (from the past 240 hour(s))  Urine culture     Status: None   Collection Time: 02/02/16  2:30 PM  Result Value Ref Range Status   Colony Count NO GROWTH  Final   Organism ID, Bacteria NO GROWTH  Final  Urine culture     Status: Abnormal   Collection Time: 02/04/16  6:39 PM  Result Value Ref Range Status   Specimen Description URINE, CLEAN CATCH  Final   Special Requests NONE  Final   Culture MULTIPLE SPECIES PRESENT, SUGGEST RECOLLECTION (A)  Final   Report Status 02/06/2016 FINAL  Final  Wet prep, genital     Status: Abnormal   Collection Time: 02/04/16 10:34 PM  Result Value Ref Range Status   Yeast Wet Prep HPF POC NONE SEEN NONE SEEN Final   Trich, Wet Prep NONE SEEN NONE SEEN Final   Clue Cells Wet Prep HPF POC PRESENT (A) NONE SEEN Final   WBC, Wet Prep HPF POC FEW (A) NONE SEEN Final   Sperm NONE SEEN  Final  Culture, blood (routine x 2)     Status: None (Preliminary result)   Collection Time: 02/05/16  6:43 AM  Result Value Ref Range Status   Specimen Description BLOOD RIGHT WRIST   Final   Special Requests   Final    BOTTLES DRAWN AEROBIC AND ANAEROBIC  10 CC AER 5CC ANA   Culture NO GROWTH 1 DAY  Final   Report Status PENDING  Incomplete  Culture, blood (routine x 2)     Status: None (Preliminary result)   Collection Time: 02/05/16  6:51 AM  Result Value Ref Range Status   Specimen Description BLOOD RIGHT UPPER ARM  Final   Special Requests IN PEDIATRIC BOTTLE  4CC  Final   Culture NO GROWTH 1 DAY  Final   Report Status PENDING  Incomplete  Urine culture     Status: None (  Preliminary result)   Collection Time: 02/05/16  9:33 AM  Result Value Ref Range Status   Specimen Description URINE, RANDOM  Final   Special Requests NONE  Final   Culture CULTURE REINCUBATED FOR BETTER GROWTH  Final   Report Status PENDING  Incomplete         Radiology Studies: Ct Angio Chest Pe W/cm &/or Wo Cm  02/05/2016  CLINICAL DATA:  Acute onset of fatigue, generalized weakness and tachycardia. Shortness of breath and chest pain. Initial encounter. EXAM: CT ANGIOGRAPHY CHEST WITH CONTRAST TECHNIQUE: Multidetector CT imaging of the chest was performed using the standard protocol during bolus administration of intravenous contrast. Multiplanar CT image reconstructions and MIPs were obtained to evaluate the vascular anatomy. CONTRAST:  50 mL of Isovue 370 IV contrast COMPARISON:  Chest radiograph performed 07/13/2014 FINDINGS: There is no evidence of pulmonary embolus. Mild patchy central opacities are noted at the lung bases, concerning for mild infection. There is no evidence of pleural effusion or pneumothorax. No masses are identified; no abnormal focal contrast enhancement is seen. The mediastinum is unremarkable in appearance. No mediastinal lymphadenopathy is seen. No pericardial effusion is identified. The great vessels are grossly unremarkable in appearance. No axillary lymphadenopathy is seen. The visualized portions of the thyroid gland are unremarkable in appearance. The visualized  portions of the liver and spleen are unremarkable. The patient is status post cholecystectomy, with clips noted at the gallbladder fossa. The visualized portions of the pancreas, adrenal glands and kidneys are within normal limits. No acute osseous abnormalities are seen. Review of the MIP images confirms the above findings. IMPRESSION: 1. No evidence of pulmonary embolus. 2. Mild patchy central opacities at the lung bases, concerning for mild pneumonia. Electronically Signed   By: Garald Balding M.D.   On: 02/05/2016 05:12        Scheduled Meds: . atorvastatin  40 mg Oral q1800  . aztreonam  2 g Intravenous Q8H  . enoxaparin (LOVENOX) injection  40 mg Subcutaneous Q24H  . insulin aspart  0-5 Units Subcutaneous QHS  . insulin aspart  0-9 Units Subcutaneous TID WC  . insulin aspart  5 Units Subcutaneous TID WC  . insulin glargine  50 Units Subcutaneous BID  . metroNIDAZOLE  500 mg Oral Q8H  . nicotine  21 mg Transdermal Daily  . pantoprazole  40 mg Oral Daily  . vancomycin  1,000 mg Intravenous Q12H   Continuous Infusions: . 0.9 % NaCl with KCl 20 mEq / L 200 mL (02/06/16 1025)        Time spent: 25 minutes.    Hosie Poisson, MD [facility] Pager [phone]  If 7PM-7AM, please contact night-coverage [email or web address] Password [facility] 02/06/2016, 3:15 PM

## 2016-02-07 ENCOUNTER — Inpatient Hospital Stay (HOSPITAL_COMMUNITY): Payer: Medicaid - Out of State

## 2016-02-07 DIAGNOSIS — E1040 Type 1 diabetes mellitus with diabetic neuropathy, unspecified: Secondary | ICD-10-CM

## 2016-02-07 DIAGNOSIS — I10 Essential (primary) hypertension: Secondary | ICD-10-CM

## 2016-02-07 DIAGNOSIS — E1065 Type 1 diabetes mellitus with hyperglycemia: Secondary | ICD-10-CM

## 2016-02-07 LAB — H. PYLORI ANTIBODY, IGG: H Pylori IgG: 1.1 U/mL — ABNORMAL HIGH (ref 0.0–0.8)

## 2016-02-07 LAB — GLUCOSE, CAPILLARY
Glucose-Capillary: 147 mg/dL — ABNORMAL HIGH (ref 65–99)
Glucose-Capillary: 237 mg/dL — ABNORMAL HIGH (ref 65–99)
Glucose-Capillary: 228 mg/dL — ABNORMAL HIGH (ref 65–99)
Glucose-Capillary: 265 mg/dL — ABNORMAL HIGH (ref 65–99)

## 2016-02-07 LAB — URINE CULTURE: Culture: >=100000 — AB

## 2016-02-07 LAB — HEMOGLOBIN A1C
Hgb A1c MFr Bld: 12.7 % — ABNORMAL HIGH (ref 4.8–5.6)
Mean Plasma Glucose: 318 mg/dL

## 2016-02-07 LAB — GC/CHLAMYDIA PROBE AMP (~~LOC~~) NOT AT ARMC
Chlamydia: NEGATIVE
Neisseria Gonorrhea: NEGATIVE

## 2016-02-07 MED ORDER — FLUCONAZOLE 100 MG PO TABS
200.0000 mg | ORAL_TABLET | Freq: Once | ORAL | Status: AC
  Administered 2016-02-07: 200 mg via ORAL
  Filled 2016-02-07: qty 2

## 2016-02-07 MED ORDER — INSULIN ASPART 100 UNIT/ML ~~LOC~~ SOLN
0.0000 [IU] | Freq: Three times a day (TID) | SUBCUTANEOUS | Status: DC
Start: 2016-02-08 — End: 2016-02-08
  Administered 2016-02-08: 4 [IU] via SUBCUTANEOUS

## 2016-02-07 MED ORDER — LABETALOL HCL 5 MG/ML IV SOLN
10.0000 mg | Freq: Once | INTRAVENOUS | Status: AC
  Administered 2016-02-07: 10 mg via INTRAVENOUS
  Filled 2016-02-07: qty 4

## 2016-02-07 MED ORDER — LEVOFLOXACIN 750 MG PO TABS
750.0000 mg | ORAL_TABLET | Freq: Every day | ORAL | Status: DC
  Administered 2016-02-07 – 2016-02-08 (×2): 750 mg via ORAL
  Filled 2016-02-07 (×2): qty 1

## 2016-02-07 MED ORDER — METRONIDAZOLE 500 MG PO TABS
500.0000 mg | ORAL_TABLET | Freq: Two times a day (BID) | ORAL | Status: DC
Start: 2016-02-07 — End: 2016-02-08
  Administered 2016-02-07 – 2016-02-08 (×2): 500 mg via ORAL
  Filled 2016-02-07 (×2): qty 1

## 2016-02-07 NOTE — Progress Notes (Addendum)
Inpatient Diabetes Program Recommendations  AACE/ADA: New Consensus Statement on Inpatient Glycemic Control (2015)  Target Ranges:  Prepandial:   less than 140 mg/dL      Peak postprandial:   less than 180 mg/dL (1-2 hours)      Critically ill patients:  140 - 180 mg/dL  Results for KORTNY, SULEMAN (MRN [id number]) as of 02/07/2016 10:40  Ref. Range 02/06/2016 08:13 02/06/2016 12:02 02/06/2016 17:42 02/06/2016 21:47 02/07/2016 07:45  Glucose-Capillary Latest Ref Range: 65-99 mg/dL 312 (H) 257 (H) 207 (H) 294 (H) 147 (H)  Results for NIOMIE, FADLEY (MRN [id number]) as of 02/07/2016 10:40  Ref. Range 01/21/2016 10:45  Hemoglobin A1C Latest Ref Range: 4.8-5.6 % 12.2 (H)    Review of Glycemic Control  Diabetes history: DM2 Outpatient Diabetes medications: Lantus 50 units BID, Novolin R 5-25 units TID with meals Current orders for Inpatient glycemic control: Lantus 50 units BID, Novolog 5 units TID with meals for meal coverage, Novolog 0-9 units TID with meals, Novolog 0-5 units QHS  Inpatient Diabetes Program Recommendations: Insulin - Meal Coverage: Please consider increasing meal coverage to Novolog 10 units TID with meals. HgbA1C: Current A1C in process. Last A1C in the chart was 12.2% on 01/21/2016 indicating an average glucose of 312 mg/dl.  NOTE: Noted in chart that patient has not been able to get DM medications as she is from [place] area but visiting her boyfriend in [place] for at least past 30 days. Called patient over the phone; however, PT in room with patient at this time and patient asked to call back in 30 minutes.   Addendum 02/07/16@13 :19-Have attempted to call patient multiple times but patient is not answering phone. Called RN and asked that she ask patient to answer her phone so diabetes coordinator (off campus) could talk with her over the phone. However, again tried to call patient but no answer. Will continue to try to call patient.  Thanks, Barnie Alderman, RN, MSN,  CDE Diabetes Coordinator Inpatient Diabetes Program [phone] (Team Pager from [place] to [place]) [phone] ([place] office) [phone] [facility] office) [phone] [facility] office)

## 2016-02-07 NOTE — Progress Notes (Signed)
PT Cancellation and Discharge Note  Patient Details Name: Barbara Wang MRN: [id number] DOB: 09/04/1965   Cancelled Treatment:    Reason Eval/Treat Not Completed: PT screened, no needs identified, will sign off.  Pt adamant about not needing PT services, stating that she is getting up by herself.  Confirmed with RN that she is independent for nsg staff.  No further PT needs at this time.  Please re-order if needs arise.     Catarina Hartshorn, [place] 02/07/2016, 12:31 PM

## 2016-02-07 NOTE — Progress Notes (Signed)
MD on unit notified of pt's burst of svt.  Pt asymptomatic will continue to monitor the pt.Hoover Brunette, RN

## 2016-02-07 NOTE — Progress Notes (Signed)
PROGRESS NOTE    Barbara Wang  [id number] DOB: [birth year] DOA: 02/04/2016 PCP: Default, Provider, MD    Brief Narrative:Barbara Wang is a 50 y.o. female with a Past Medical History of HTN, DM with neuropathy presents with abdominal pain and generalized weakness. She was found to have developing pneumonia and appears to have run out of anti diabetic medications.   Assessment & Plan:   Principal Problem:   HCAP (healthcare-associated pneumonia) Active Problems:   HTN (hypertension)   Diabetes mellitus, insulin dependent (IDDM), uncontrolled ([place])   Bacterial vaginosis   Volume depletion   Abnormal urinalysis  Health Care associated pneumonia: Admitted to telemetry.  Started on broad spectrum antibiotics, and nac oxygen as needed.  PE was ruled out.  Blood cultures ordered and negative so far.  Transition to oral antibiotics soon.  Abdominal pain: probably secondary to bacterial vaginosis vs UTI .  Non specific.  Liver fucntion panel is normal.  abd exam is benign.  Currently on flagyl for vaginosis and diflucan for yeast in the UTI.  Urine cultures show yeast.  Dehydration/ generalized weakness: - gentle hydration and PT evaluation.   Uncontrolled diabetes mellitus: - hgba1c is pending.  CBG (last 3)   Recent Labs  02/06/16 2147 02/07/16 0745 02/07/16 1203  GLUCAP 294* 147* 237*    Restarted her lantus and SSI.  If her cbgs remain elevated, plan to add on novolog premeals.  Increased the SSI to resistant scale.      DVT prophylaxis: (Lovenox/) Code Status: (Full) Family Communication: none at bedside.  Disposition Plan: home tomorrow.    Consultants:   none   Procedures: none   Antimicrobials: vancomycin  Aztreonam   Flagyl bacterial vaginosis.   Diflucan yeast in the urine.    Subjective: Reports burning micturition.   Objective: Filed Vitals:   02/06/16 0510 02/06/16 2150 02/07/16 0559 02/07/16 1519  BP: 139/64  178/83 174/73 152/69  Pulse: 74 77 76 78  Temp: 98 F (36.7 C) 98.3 F (36.8 C) 98.3 F (36.8 C) 98.1 F (36.7 C)  TempSrc: Oral  Oral   Resp: 18 18 16 20   Height:      Weight:      SpO2: 98% 98% 96% 98%    Intake/Output Summary (Last 24 hours) at 02/07/16 1729 Last data filed at 02/06/16 1816  Gross per 24 hour  Intake     60 ml  Output      0 ml  Net     60 ml   Filed Weights   02/04/16 1730 02/05/16 0846  Weight: 87.091 kg (192 lb) 88.043 kg (194 lb 1.6 oz)    Examination:  General exam: Appears calm and comfortable  Respiratory system: Clear to auscultation. Respiratory effort normal. Cardiovascular system: S1 & S2 heard, RRR. No JVD, murmurs, rubs, gallops or clicks. No pedal edema. Gastrointestinal system: Abdomen is nondistended, soft and nontender. No organomegaly or masses felt. Normal bowel sounds heard. Central nervous system: Alert and oriented. No focal neurological deficits. Extremities: Symmetric 5 x 5 power. Skin: No rashes, lesions or ulcers Psychiatry: Judgement and insight appear normal. Mood & affect appropriate.     Data Reviewed: I have personally reviewed following labs and imaging studies  CBC:  Recent Labs Lab 02/02/16 1443 02/04/16 1753 02/06/16 0537  WBC 7.5 7.0 6.5  NEUTROABS 4875  --  4.1  HGB 15.2 14.5 11.6*  HCT 45.2* 43.2 36.0  MCV 85.4 85.9 86.5  PLT 433* 333 284  Basic Metabolic Panel:  Recent Labs Lab 02/02/16 1443 02/04/16 1753 02/06/16 0537  NA 137 134* 139  K 4.1 4.0 4.3  CL 100 98* 107  CO2 25 27 25   GLUCOSE 266* 502* 346*  BUN 24 22* 17  CREATININE 0.92 1.12* 0.98  CALCIUM 10.0 9.6 8.6*   GFR: Estimated Creatinine Clearance: 72.1 mL/min (by C-G formula based on Cr of 0.98). Liver Function Tests:  Recent Labs Lab 02/02/16 1443 02/04/16 1753 02/06/16 0537  AST 14 18 15   ALT 17 22 13*  ALKPHOS 109 99 80  BILITOT 0.4 0.8 0.5  PROT 7.7 7.7 5.8*  ALBUMIN 4.1 3.6 2.6*    Recent Labs Lab  02/02/16 1443 02/04/16 1753  LIPASE 1894* 152*   No results for input(s): AMMONIA in the last 168 hours. Coagulation Profile: No results for input(s): INR, PROTIME in the last 168 hours. Cardiac Enzymes: No results for input(s): CKTOTAL, CKMB, CKMBINDEX, TROPONINI in the last 168 hours. BNP (last 3 results) No results for input(s): PROBNP in the last 8760 hours. HbA1C:  Recent Labs  02/05/16 0730  HGBA1C 12.7*   CBG:  Recent Labs Lab 02/06/16 1202 02/06/16 1742 02/06/16 2147 02/07/16 0745 02/07/16 1203  GLUCAP 257* 207* 294* 147* 237*   Lipid Profile: No results for input(s): CHOL, HDL, LDLCALC, TRIG, CHOLHDL, LDLDIRECT in the last 72 hours. Thyroid Function Tests:  Recent Labs  02/04/16 2108  TSH 0.428   Anemia Panel: No results for input(s): VITAMINB12, FOLATE, FERRITIN, TIBC, IRON, RETICCTPCT in the last 72 hours. Sepsis Labs:  Recent Labs Lab 02/05/16 0705  LATICACIDVEN 0.92    Recent Results (from the past 240 hour(s))  Urine culture     Status: None   Collection Time: 02/02/16  2:30 PM  Result Value Ref Range Status   Colony Count NO GROWTH  Final   Organism ID, Bacteria NO GROWTH  Final  Urine culture     Status: Abnormal   Collection Time: 02/04/16  6:39 PM  Result Value Ref Range Status   Specimen Description URINE, CLEAN CATCH  Final   Special Requests NONE  Final   Culture MULTIPLE SPECIES PRESENT, SUGGEST RECOLLECTION (A)  Final   Report Status 02/06/2016 FINAL  Final  Wet prep, genital     Status: Abnormal   Collection Time: 02/04/16 10:34 PM  Result Value Ref Range Status   Yeast Wet Prep HPF POC NONE SEEN NONE SEEN Final   Trich, Wet Prep NONE SEEN NONE SEEN Final   Clue Cells Wet Prep HPF POC PRESENT (A) NONE SEEN Final   WBC, Wet Prep HPF POC FEW (A) NONE SEEN Final   Sperm NONE SEEN  Final  Culture, blood (routine x 2)     Status: None (Preliminary result)   Collection Time: 02/05/16  6:43 AM  Result Value Ref Range Status    Specimen Description BLOOD RIGHT WRIST  Final   Special Requests   Final    BOTTLES DRAWN AEROBIC AND ANAEROBIC  10 CC AER 5CC ANA   Culture NO GROWTH 2 DAYS  Final   Report Status PENDING  Incomplete  Culture, blood (routine x 2)     Status: None (Preliminary result)   Collection Time: 02/05/16  6:51 AM  Result Value Ref Range Status   Specimen Description BLOOD RIGHT UPPER ARM  Final   Special Requests IN PEDIATRIC BOTTLE  4CC  Final   Culture NO GROWTH 2 DAYS  Final   Report Status PENDING  Incomplete  Urine culture     Status: Abnormal   Collection Time: 02/05/16  9:33 AM  Result Value Ref Range Status   Specimen Description URINE, RANDOM  Final   Special Requests NONE  Final   Culture >=100,000 COLONIES/mL YEAST (A)  Final   Report Status 02/07/2016 FINAL  Final         Radiology Studies: Dg Chest 2 View  02/07/2016  CLINICAL DATA:  Pneumonia.  Chest pain.  Weakness. EXAM: CHEST  2 VIEW COMPARISON:  CT chest 02/05/2016.  Chest x-ray 07/13/2014. FINDINGS: The lungs are clear wiithout focal pneumonia, edema, pneumothorax or pleural effusion. Interstitial markings are diffusely coarsened with chronic features. The cardio pericardial silhouette is enlarged. The visualized bony structures of the thorax are intact. Telemetry leads overlie the chest. IMPRESSION: No dense focal airspace consolidation evident by x-ray. Patchy central opacities seen at the lung bases on the previous CT are not evident by x-ray today. Electronically Signed   By: Misty Stanley M.D.   On: 02/07/2016 13:46        Scheduled Meds: . atorvastatin  40 mg Oral q1800  . enoxaparin (LOVENOX) injection  40 mg Subcutaneous Q24H  . [START ON 02/08/2016] insulin aspart  0-20 Units Subcutaneous TID WC  . insulin aspart  0-5 Units Subcutaneous QHS  . insulin aspart  5 Units Subcutaneous TID WC  . insulin glargine  50 Units Subcutaneous BID  . levofloxacin  750 mg Oral Daily  . metroNIDAZOLE  500 mg Oral Q12H  .  nicotine  21 mg Transdermal Daily  . pantoprazole  40 mg Oral Daily   Continuous Infusions:     LOS: 1 day    Time spent: 25 minutes.    Hosie Poisson, MD [facility] Pager [phone]  If 7PM-7AM, please contact night-coverage [email or web address] Password [facility] 02/07/2016, 5:29 PM

## 2016-02-08 ENCOUNTER — Telehealth: Payer: Self-pay

## 2016-02-08 ENCOUNTER — Telehealth: Payer: Self-pay | Admitting: *Deleted

## 2016-02-08 DIAGNOSIS — A499 Bacterial infection, unspecified: Secondary | ICD-10-CM

## 2016-02-08 DIAGNOSIS — N760 Acute vaginitis: Secondary | ICD-10-CM

## 2016-02-08 LAB — GLUCOSE, CAPILLARY
Glucose-Capillary: 89 mg/dL (ref 65–99)
Glucose-Capillary: 191 mg/dL — ABNORMAL HIGH (ref 65–99)

## 2016-02-08 MED ORDER — OXYCODONE HCL 5 MG PO TABS: 5.0000 mg | ORAL_TABLET | Freq: Two times a day (BID) | ORAL | Status: DC | PRN

## 2016-02-08 MED ORDER — METRONIDAZOLE 500 MG PO TABS: 500.0000 mg | ORAL_TABLET | Freq: Two times a day (BID) | ORAL | Status: DC

## 2016-02-08 MED ORDER — INSULIN GLARGINE 100 UNIT/ML SOLOSTAR PEN: 50.0000 [IU] | PEN_INJECTOR | Freq: Two times a day (BID) | SUBCUTANEOUS | Status: DC

## 2016-02-08 MED ORDER — LEVOFLOXACIN 750 MG PO TABS: 750.0000 mg | ORAL_TABLET | Freq: Every day | ORAL | Status: DC

## 2016-02-08 MED ORDER — INSULIN ASPART 100 UNIT/ML FLEXPEN: PEN_INJECTOR | SUBCUTANEOUS | Status: DC

## 2016-02-08 NOTE — Progress Notes (Signed)
NURSING PROGRESS NOTE  Barbara Wang [id number] Discharge Data: 02/08/2016 3:07 PM Attending Provider: Hosie Poisson, MD [id number], Provider, MD   Madolyn Frieze to be D/C'd Home per MD order.    All IV's will be discontinued and monitored for bleeding.  All belongings will be returned to patient for patient to take home.  Last Documented Vital Signs:  Blood pressure 153/74, pulse 66, temperature 98.6 F (37 C), temperature source Oral, resp. rate 20, height 5\' 4"  (1.626 m), weight 88.043 kg (194 lb 1.6 oz), SpO2 100 %.  Joslyn Hy, MSN, RN, [employer]

## 2016-02-08 NOTE — Progress Notes (Addendum)
Spoke with patient about diabetes and home regimen for diabetes control. Patient states that she has Medicaid in [place] but has been denied [place] Medicaid multiple times when she has applied in the past. Patient stated that she came to [place] to visit her mother.  Patient reports that she is followed by PCP in [place] for diabetes management and currently she takes Lantus 40 units BID and Novolin R 5-25 units TID with meals as an outpatient for diabetes control. Patient states that she went to the [facility] and [facility] on 02/02/2016 and that she was able to get the medications she needed for $4 copay. However, at that time she still had insulin and was anticipating on returning to [place] before she ran out of insulin. Patient reports that she ran out of insulin and that she needs to see if she can get one vial of Lantus and one vial of Novolin R at time of discharge so that she can get back home to [place] within a week and get her DM medications refilled. Patient states that she checks her glucose 2-3 per day and that when she takes her insulin it is "still up and down".  Discussed A1C results (12.7% on 02/05/2016) and explained that his current A1C indicates an average glucose of 318 mg/dl over the past 2-3 months. Discussed glucose and A1C goals. Discussed importance of checking CBGs and maintaining good CBG control to prevent long-term and short-term complications. Explained how hyperglycemia leads to damage within blood vessels which lead to the common complications seen with uncontrolled diabetes. Stressed to the patient the importance of improving glycemic control to prevent further complications from uncontrolled diabetes. Discussed impact of nutrition, exercise, stress, sickness, and medications on diabetes control. Informed patient that diabetes coordinator will talk with Case Management to see if the hospital can provide any medication assistance. However, explained to the patient that since she  has active [place] Medicaid the hospital may not be able to provide her with an assistance.  Patient states that she plans to return to [place] within a week and she will be able to get her insulins refilled in [place]. Discussed generic insulins (Novolin R, Novolin N, and Novolin 70/30) that can be purchased at [facility] for $25 per vial and asked patient if she could afford to get generic insulins from [facility]. Patient states that she does not have $25 for generic insulin and again states that she needs to be given a vial of Lantus and Novolin R. Will discuss with CM.  Patient reports that she has all supplies needed to check her glucose. Encouraged patient to check her glucose 3-4 times per day (before meals and at bedtime) and to keep a log book of glucose readings and insulin taken which she will need to take to doctor appointments. Explained that she needs to follow up with her doctor in [place] to improve diabetes control. Patient verbalized understanding of information discussed and she states that she has no further questions at this time related to diabetes.  Called Whitman Hero, RN, CM to discuss insulin needs and patient request for vial of Lantus and Novolin R. CM stated that since patient has [place] Medicaid patient is no eligable for [facility] program. Informed CM that patient has been seen by [facility] recently so patient can follow up there while still in [place]. Called [facility] pharmacy to asked about insulin samples and/or to see if the clinic could provide the patient with Lantus and rapid acting insulin. Spoke  with Samul Dada) representative from [place] and was told that patient is not eligible for samples of insulin since she is not planning to establish care at the clinic since she plans to return to [place]. As samples are for established patients with PCP's at the clinic who need insulin and are in the process of applying for medication assistance. Samul Dada talked with the clinic director and got approval for patient to get  one Lantus 3 ml insulin pen for $10 copay and one Novolog 3 ml insulin pen for $10 copay. By getting one insulin pen of Lantus and one of Novolog patient should have enough insulin to last her about 1 week to give her time to get back to [place]. Called patient back to inform her of Lantus and Novolog insulin pen for $10 each at the [facility] and patient stated "that isn't doing me any good because I don't have $20 to get the insulin pens."  Encouraged patient to ask family and/or friends to see if they can give her the $20 to get the insulin pens from the [facility].   MD, at time discharge, please provide a Rx for Lantus 3 ml insulin pen and Novolog 3 ml insulin pen.  Thanks, Barnie Alderman, RN, MSN, CDE Diabetes Coordinator Inpatient Diabetes Program [phone] (Team Pager) [phone] ([place] office) [phone] [facility] office) [phone] [facility] office)

## 2016-02-08 NOTE — Telephone Encounter (Signed)
Message received from Blanchie Dessert, [place], inquiring if the patient would be eligible for a [facility] letter for her insulins.  The cost of the insulins would be $20.   Call placed to Whitman Hero, RN CM and inquired about the [facility] letter. She stated that the patient is not eligible for a MATCH letter at this time. She noted that the patient is aware that her insulin would cost $20 at [place] and she ( the patient)  is currently trying to collect the money from a friend. The patient is aware that the [facility] pharmacy is open until 1730.

## 2016-02-08 NOTE — Care Management Note (Signed)
Case Management Note  Patient Details  Name: Barbara Wang MRN: [id number] Date of Birth: Oct 25, 1965  Subjective/Objective:                 CM spoke with pt regarding insulin pens. [facility] pharmacy will provide pt with lantus and novolog pens @ a $ 10.00 cost for each. CM explained information to pt and pt stated she couldn't afford the cost of the pens, and stated she will solicit from family and friends to help with cost of medication.CM informed pt prescriptions(lantus/novolog pens) were electronically sent to [facility] pharmacy and the pharmacy closes @ 1730.   Action/Plan: Discharge to home today.  Expected Discharge Date:       02/08/2016           Expected Discharge Plan:  Home/Self Care Mallie Snooks, fiance)  In-House Referral:     Discharge planning Services  CM Consult  Post Acute Care Choice:    Choice offered to:     DME Arranged:    DME Agency:     HH Arranged:    [place] Agency:     Status of Service:  Completed   Medicare Important Message Given:    Date Medicare IM Given:    Medicare IM give by:    Date Additional Medicare IM Given:    Additional Medicare Important Message give by:     If discussed at [place] of Stay Meetings, dates discussed:    Additional Comments:  Sharin Mons, [place] [phone] 02/08/2016, 12:52 PM

## 2016-02-08 NOTE — Telephone Encounter (Signed)
Patient is currently in the hospital. Results have not been reviewed by the provider at this time.

## 2016-02-08 NOTE — Discharge Summary (Signed)
Physician Discharge Summary  ZOEL PICOU [id number] DOB: 1966/08/08 DOA: 02/04/2016  PCP: Default, Provider, MD  Admit date: 02/04/2016 Discharge date: 02/08/2016  Admitted From: Home Disposition: Home   Recommendations for Outpatient Follow-up:  1. Follow up with PCP in 1-2 weeks 2. Please obtain BMP/CBC in one week     Discharge Condition:stable.  CODE STATUS:full code.  Diet recommendation: Heart Healthy / Carb Modified  Brief/Interim Summary: Tenishia H Chamorro is a 50 y.o. female with a Past Medical History of HTN, DM with neuropathy presents with abdominal pain and generalized weakness. She was found to have developing pneumonia and appears to have run out of anti diabetic medications.   Discharge Diagnoses:  Principal Problem:   HCAP (healthcare-associated pneumonia) Active Problems:   HTN (hypertension)   Diabetes mellitus, insulin dependent (IDDM), uncontrolled ([place])   Bacterial vaginosis   Volume depletion   Abnormal urinalysis  Health Care associated pneumonia: Admitted to telemetry.  Started on broad spectrum antibiotics, and nac oxygen as needed.  PE was ruled out.  Blood cultures ordered and negative so far.  Transition to oral antibiotics on discharge.   Abdominal pain: probably secondary to bacterial vaginosis vs UTI .  Non specific.  Liver fucntion panel is normal.  abd exam is benign.  Currently on flagyl for vaginosis and diflucan for yeast in the UTI.  Urine cultures show yeast. Discharged on oral flagyl.   Dehydration/ generalized weakness: - gentle hydration and PT evaluation.   Uncontrolled diabetes mellitus: - hgba1c is around 12.  CBG (last 3)   Recent Labs (last 2 labs)      Recent Labs  02/06/16 2147 02/07/16 0745 02/07/16 1203  GLUCAP 294* 147* 237*      Restarted her lantus and SSI.         Discharge Instructions  Discharge Instructions    Diet - low sodium heart healthy    Complete  by:  As directed      Discharge instructions    Complete by:  As directed   Please follow up with PCP in one week.            Medication List    STOP taking these medications        acetaminophen-codeine 300-30 MG tablet  Commonly known as:  TYLENOL #3     HUMULIN R 100 units/mL injection  Generic drug:  insulin regular     insulin glargine 100 UNIT/ML injection  Commonly known as:  LANTUS  Replaced by:  Insulin Glargine 100 UNIT/ML Solostar Pen      TAKE these medications        acetaminophen 650 MG CR tablet  Commonly known as:  TYLENOL  Take 1,300 mg by mouth every 4 (four) hours.     atorvastatin 40 MG tablet  Commonly known as:  LIPITOR  Take 40 mg by mouth daily at 6 PM.     insulin aspart 100 UNIT/ML FlexPen  Commonly known as:  NOVOLOG  CBG 70 - 120: 0 units CBG 121 - 150: 3 units CBG 151 - 200: 4 units CBG 201 - 250: 7 units CBG 251 - 300: 11 units CBG 301 - 350: 15 units CBG 351 - 400: 20 units     Insulin Glargine 100 UNIT/ML Solostar Pen  Commonly known as:  LANTUS  Inject 50 Units into the skin 2 (two) times daily.     levofloxacin 750 MG tablet  Commonly known as:  LEVAQUIN  Take 1 tablet (750  mg total) by mouth daily.  Start taking on:  02/09/2016     lisinopril-hydrochlorothiazide 10-12.5 MG tablet  Commonly known as:  PRINZIDE,ZESTORETIC  Take 1 tablet by mouth daily.     metroNIDAZOLE 500 MG tablet  Commonly known as:  FLAGYL  Take 1 tablet (500 mg total) by mouth every 12 (twelve) hours.     oxyCODONE 5 MG immediate release tablet  Commonly known as:  Oxy IR/ROXICODONE  Take 1 tablet (5 mg total) by mouth every 12 (twelve) hours as needed for severe pain.     pantoprazole 40 MG tablet  Commonly known as:  PROTONIX  Take 1 tablet (40 mg total) by mouth daily.           Follow-up Information    Follow up with [facility]. Schedule an appointment as soon as possible for a visit in 2 days.   Contact  information:   [place] [phone]     Allergies  Allergen Reactions  . Penicillins Hives and Shortness Of Breath    Has patient had a PCN reaction causing immediate rash, facial/tongue/throat swelling, SOB or lightheadedness with hypotension: Yes Has patient had a PCN reaction causing severe rash involving mucus membranes or skin necrosis: No Has patient had a PCN reaction that required hospitalization No Has patient had a PCN reaction occurring within the last 10 years: No If all of the above answers are "NO", then may proceed with Cephalosporin use.  . Azithromycin Hives  . Sulfa Antibiotics Hives  . Tramadol Hives    Consultations:  none   Procedures/Studies: Dg Chest 2 View  02/07/2016  CLINICAL DATA:  Pneumonia.  Chest pain.  Weakness. EXAM: CHEST  2 VIEW COMPARISON:  CT chest 02/05/2016.  Chest x-ray 07/13/2014. FINDINGS: The lungs are clear wiithout focal pneumonia, edema, pneumothorax or pleural effusion. Interstitial markings are diffusely coarsened with chronic features. The cardio pericardial silhouette is enlarged. The visualized bony structures of the thorax are intact. Telemetry leads overlie the chest. IMPRESSION: No dense focal airspace consolidation evident by x-ray. Patchy central opacities seen at the lung bases on the previous CT are not evident by x-ray today. Electronically Signed   By: Misty Stanley M.D.   On: 02/07/2016 13:46   Ct Angio Chest Pe W/cm &/or Wo Cm  02/05/2016  CLINICAL DATA:  Acute onset of fatigue, generalized weakness and tachycardia. Shortness of breath and chest pain. Initial encounter. EXAM: CT ANGIOGRAPHY CHEST WITH CONTRAST TECHNIQUE: Multidetector CT imaging of the chest was performed using the standard protocol during bolus administration of intravenous contrast. Multiplanar CT image reconstructions and MIPs were obtained to evaluate the vascular anatomy. CONTRAST:  50 mL of Isovue 370 IV  contrast COMPARISON:  Chest radiograph performed 07/13/2014 FINDINGS: There is no evidence of pulmonary embolus. Mild patchy central opacities are noted at the lung bases, concerning for mild infection. There is no evidence of pleural effusion or pneumothorax. No masses are identified; no abnormal focal contrast enhancement is seen. The mediastinum is unremarkable in appearance. No mediastinal lymphadenopathy is seen. No pericardial effusion is identified. The great vessels are grossly unremarkable in appearance. No axillary lymphadenopathy is seen. The visualized portions of the thyroid gland are unremarkable in appearance. The visualized portions of the liver and spleen are unremarkable. The patient is status post cholecystectomy, with clips noted at the gallbladder fossa. The visualized portions of the pancreas, adrenal glands and kidneys are within normal limits. No  acute osseous abnormalities are seen. Review of the MIP images confirms the above findings. IMPRESSION: 1. No evidence of pulmonary embolus. 2. Mild patchy central opacities at the lung bases, concerning for mild pneumonia. Electronically Signed   By: Garald Balding M.D.   On: 02/05/2016 05:12   Ct Abdomen Pelvis W Contrast  01/21/2016  CLINICAL DATA:  Bilateral abdominal pain radiating through to back 3-4 days with nausea and vomiting. Concern for pancreatitis. EXAM: CT ABDOMEN AND PELVIS WITH CONTRAST TECHNIQUE: Multidetector CT imaging of the abdomen and pelvis was performed using the standard protocol following bolus administration of intravenous contrast. CONTRAST:  155mL ISOVUE-300 IOPAMIDOL (ISOVUE-300) INJECTION 61% COMPARISON:  07/16/2012 FINDINGS: Lung bases demonstrate mild bibasilar scarring. Abdominal images demonstrate evidence of previous cholecystectomy. The liver, spleen and adrenal glands are within normal. There is subtle hazy attenuation of the peripancreatic fat adjacent the head of the pancreas as well as tail of the  pancreas. Findings may be due to mild acute pancreatitis. Kidneys are normal in size without hydronephrosis or nephrolithiasis. There are a few small sub cm renal cortical hypodensities bilaterally too small to characterize but likely cysts. Ureters are within normal. Vascular structures are within normal. Stomach is within normal. Small bowel is within normal. Appendix is normal. Colon is unremarkable. Few small lymph nodes in gastrohepatic ligament and celiac axis. The mesentery is otherwise unremarkable. Pelvic images demonstrate surgical absence of the uterus. Bladder, adnexa and rectum are normal. No free fluid. Remaining bones hand soft tissues are unchanged. IMPRESSION: Subtle findings as described likely representing mild acute pancreatitis. Few small bilateral sub cm renal cortical hypodensities too small to characterize but likely cysts. Electronically Signed   By: Marin Olp M.D.   On: 01/21/2016 14:06       Subjective: Denies any new complaints.   Discharge Exam: Filed Vitals:   02/07/16 2118 02/08/16 0508  BP: 158/80 153/74  Pulse: 73 66  Temp: 99.3 F (37.4 C) 98.6 F (37 C)  Resp: 18 20   Filed Vitals:   02/07/16 0559 02/07/16 1519 02/07/16 2118 02/08/16 0508  BP: 174/73 152/69 158/80 153/74  Pulse: 76 78 73 66  Temp: 98.3 F (36.8 C) 98.1 F (36.7 C) 99.3 F (37.4 C) 98.6 F (37 C)  TempSrc: Oral  Oral Oral  Resp: 16 20 18 20   Height:      Weight:      SpO2: 96% 98% 97% 100%    General: Pt is alert, awake, not in acute distress Cardiovascular: RRR, S1/S2 +, no rubs, no gallops Respiratory: CTA bilaterally, no wheezing, no rhonchi Abdominal: Soft, NT, ND, bowel sounds + Extremities: no edema, no cyanosis    The results of significant diagnostics from this hospitalization (including imaging, microbiology, ancillary and laboratory) are listed below for reference.     Microbiology: Recent Results (from the past 240 hour(s))  Urine culture     Status:  None   Collection Time: 02/02/16  2:30 PM  Result Value Ref Range Status   Colony Count NO GROWTH  Final   Organism ID, Bacteria NO GROWTH  Final  Urine culture     Status: Abnormal   Collection Time: 02/04/16  6:39 PM  Result Value Ref Range Status   Specimen Description URINE, CLEAN CATCH  Final   Special Requests NONE  Final   Culture MULTIPLE SPECIES PRESENT, SUGGEST RECOLLECTION (A)  Final   Report Status 02/06/2016 FINAL  Final  Wet prep, genital     Status: Abnormal  Collection Time: 02/04/16 10:34 PM  Result Value Ref Range Status   Yeast Wet Prep HPF POC NONE SEEN NONE SEEN Final   Trich, Wet Prep NONE SEEN NONE SEEN Final   Clue Cells Wet Prep HPF POC PRESENT (A) NONE SEEN Final   WBC, Wet Prep HPF POC FEW (A) NONE SEEN Final   Sperm NONE SEEN  Final  Culture, blood (routine x 2)     Status: None (Preliminary result)   Collection Time: 02/05/16  6:43 AM  Result Value Ref Range Status   Specimen Description BLOOD RIGHT WRIST  Final   Special Requests   Final    BOTTLES DRAWN AEROBIC AND ANAEROBIC  10 CC AER 5CC ANA   Culture NO GROWTH 3 DAYS  Final   Report Status PENDING  Incomplete  Culture, blood (routine x 2)     Status: None (Preliminary result)   Collection Time: 02/05/16  6:51 AM  Result Value Ref Range Status   Specimen Description BLOOD RIGHT UPPER ARM  Final   Special Requests IN PEDIATRIC BOTTLE  4CC  Final   Culture NO GROWTH 3 DAYS  Final   Report Status PENDING  Incomplete  Urine culture     Status: Abnormal   Collection Time: 02/05/16  9:33 AM  Result Value Ref Range Status   Specimen Description URINE, RANDOM  Final   Special Requests NONE  Final   Culture >=100,000 COLONIES/mL YEAST (A)  Final   Report Status 02/07/2016 FINAL  Final     Labs: BNP (last 3 results)  Recent Labs  02/05/16 0328  BNP [id number]   Basic Metabolic Panel:  Recent Labs Lab 02/02/16 1443 02/04/16 1753 02/06/16 0537  NA 137 134* 139  K 4.1 4.0 4.3  CL 100 98* 107   CO2 25 27 25   GLUCOSE 266* 502* 346*  BUN 24 22* 17  CREATININE 0.92 1.12* 0.98  CALCIUM 10.0 9.6 8.6*   Liver Function Tests:  Recent Labs Lab 02/02/16 1443 02/04/16 1753 02/06/16 0537  AST 14 18 15   ALT 17 22 13*  ALKPHOS 109 99 80  BILITOT 0.4 0.8 0.5  PROT 7.7 7.7 5.8*  ALBUMIN 4.1 3.6 2.6*    Recent Labs Lab 02/02/16 1443 02/04/16 1753  LIPASE 1894* 152*   No results for input(s): AMMONIA in the last 168 hours. CBC:  Recent Labs Lab 02/02/16 1443 02/04/16 1753 02/06/16 0537  WBC 7.5 7.0 6.5  NEUTROABS 4875  --  4.1  HGB 15.2 14.5 11.6*  HCT 45.2* 43.2 36.0  MCV 85.4 85.9 86.5  PLT 433* 333 284   Cardiac Enzymes: No results for input(s): CKTOTAL, CKMB, CKMBINDEX, TROPONINI in the last 168 hours. BNP: Invalid input(s): POCBNP CBG:  Recent Labs Lab 02/07/16 1203 02/07/16 1737 02/07/16 2115 02/08/16 0831 02/08/16 1222  GLUCAP 237* 228* 265* 89 191*   D-Dimer No results for input(s): DDIMER in the last 72 hours. Hgb A1c No results for input(s): HGBA1C in the last 72 hours. Lipid Profile No results for input(s): CHOL, HDL, LDLCALC, TRIG, CHOLHDL, LDLDIRECT in the last 72 hours. Thyroid function studies No results for input(s): TSH, T4TOTAL, T3FREE, THYROIDAB in the last 72 hours.  Invalid input(s): FREET3 Anemia work up No results for input(s): VITAMINB12, FOLATE, FERRITIN, TIBC, IRON, RETICCTPCT in the last 72 hours. Urinalysis    Component Value Date/Time   COLORURINE YELLOW 02/04/2016 1839   APPEARANCEUR CLEAR 02/04/2016 1839   LABSPEC 1.034* 02/04/2016 1839   PHURINE 5.5 02/04/2016  [place] >1000* 02/04/2016 1839   HGBUR TRACE* 02/04/2016 [place] 02/04/2016 1839   BILIRUBINUR negative 02/02/2016 1600   KETONESUR 15* 02/04/2016 1839   PROTEINUR NEGATIVE 02/04/2016 1839   PROTEINUR trace 02/02/2016 1600   UROBILINOGEN 0.2 02/02/2016 1600   UROBILINOGEN 0.2 01/15/2016 1524   NITRITE NEGATIVE 02/04/2016  1839   NITRITE negative 02/02/2016 1600   LEUKOCYTESUR TRACE* 02/04/2016 1839   Sepsis Labs Invalid input(s): PROCALCITONIN,  WBC,  LACTICIDVEN Microbiology Recent Results (from the past 240 hour(s))  Urine culture     Status: None   Collection Time: 02/02/16  2:30 PM  Result Value Ref Range Status   Colony Count NO GROWTH  Final   Organism ID, Bacteria NO GROWTH  Final  Urine culture     Status: Abnormal   Collection Time: 02/04/16  6:39 PM  Result Value Ref Range Status   Specimen Description URINE, CLEAN CATCH  Final   Special Requests NONE  Final   Culture MULTIPLE SPECIES PRESENT, SUGGEST RECOLLECTION (A)  Final   Report Status 02/06/2016 FINAL  Final  Wet prep, genital     Status: Abnormal   Collection Time: 02/04/16 10:34 PM  Result Value Ref Range Status   Yeast Wet Prep HPF POC NONE SEEN NONE SEEN Final   Trich, Wet Prep NONE SEEN NONE SEEN Final   Clue Cells Wet Prep HPF POC PRESENT (A) NONE SEEN Final   WBC, Wet Prep HPF POC FEW (A) NONE SEEN Final   Sperm NONE SEEN  Final  Culture, blood (routine x 2)     Status: None (Preliminary result)   Collection Time: 02/05/16  6:43 AM  Result Value Ref Range Status   Specimen Description BLOOD RIGHT WRIST  Final   Special Requests   Final    BOTTLES DRAWN AEROBIC AND ANAEROBIC  10 CC AER 5CC ANA   Culture NO GROWTH 3 DAYS  Final   Report Status PENDING  Incomplete  Culture, blood (routine x 2)     Status: None (Preliminary result)   Collection Time: 02/05/16  6:51 AM  Result Value Ref Range Status   Specimen Description BLOOD RIGHT UPPER ARM  Final   Special Requests IN PEDIATRIC BOTTLE  4CC  Final   Culture NO GROWTH 3 DAYS  Final   Report Status PENDING  Incomplete  Urine culture     Status: Abnormal   Collection Time: 02/05/16  9:33 AM  Result Value Ref Range Status   Specimen Description URINE, RANDOM  Final   Special Requests NONE  Final   Culture >=100,000 COLONIES/mL YEAST (A)  Final   Report Status  02/07/2016 FINAL  Final     Time coordinating discharge: Over 30 minutes  SIGNED:   Hosie Poisson, MD  [facility] 02/08/2016, 11:21 PM Pager  [phone] If 7PM-7AM, please contact night-coverage [email or web address] Password [email or web address]

## 2016-02-10 LAB — CULTURE, BLOOD (ROUTINE X 2)
Culture: NO GROWTH
Culture: NO GROWTH

## 2016-02-19 ENCOUNTER — Encounter (HOSPITAL_COMMUNITY): Payer: Self-pay

## 2016-02-19 ENCOUNTER — Emergency Department (HOSPITAL_COMMUNITY): Payer: Medicaid - Out of State

## 2016-02-19 ENCOUNTER — Inpatient Hospital Stay (HOSPITAL_COMMUNITY)
Admission: EM | Admit: 2016-02-19 | Discharge: 2016-02-23 | DRG: 872 | Disposition: A | Discharge status: Home / Self Care | Payer: Medicaid - Out of State | Attending: Internal Medicine | Admitting: Internal Medicine

## 2016-02-19 ENCOUNTER — Other Ambulatory Visit: Payer: Self-pay

## 2016-02-19 DIAGNOSIS — R509 Fever, unspecified: Secondary | ICD-10-CM | POA: Diagnosis present

## 2016-02-19 DIAGNOSIS — E785 Hyperlipidemia, unspecified: Secondary | ICD-10-CM | POA: Diagnosis present

## 2016-02-19 DIAGNOSIS — D649 Anemia, unspecified: Secondary | ICD-10-CM | POA: Diagnosis not present

## 2016-02-19 DIAGNOSIS — F329 Major depressive disorder, single episode, unspecified: Secondary | ICD-10-CM | POA: Diagnosis present

## 2016-02-19 DIAGNOSIS — Z79899 Other long term (current) drug therapy: Secondary | ICD-10-CM

## 2016-02-19 DIAGNOSIS — I10 Essential (primary) hypertension: Secondary | ICD-10-CM | POA: Diagnosis present

## 2016-02-19 DIAGNOSIS — R291 Meningismus: Secondary | ICD-10-CM | POA: Insufficient documentation

## 2016-02-19 DIAGNOSIS — E1142 Type 2 diabetes mellitus with diabetic polyneuropathy: Secondary | ICD-10-CM | POA: Diagnosis present

## 2016-02-19 DIAGNOSIS — A419 Sepsis, unspecified organism: Principal | ICD-10-CM | POA: Diagnosis present

## 2016-02-19 DIAGNOSIS — E1165 Type 2 diabetes mellitus with hyperglycemia: Secondary | ICD-10-CM | POA: Diagnosis present

## 2016-02-19 DIAGNOSIS — J45909 Unspecified asthma, uncomplicated: Secondary | ICD-10-CM | POA: Diagnosis present

## 2016-02-19 DIAGNOSIS — G039 Meningitis, unspecified: Secondary | ICD-10-CM

## 2016-02-19 DIAGNOSIS — N390 Urinary tract infection, site not specified: Secondary | ICD-10-CM

## 2016-02-19 DIAGNOSIS — Z794 Long term (current) use of insulin: Secondary | ICD-10-CM | POA: Diagnosis not present

## 2016-02-19 DIAGNOSIS — R319 Hematuria, unspecified: Secondary | ICD-10-CM

## 2016-02-19 DIAGNOSIS — F17210 Nicotine dependence, cigarettes, uncomplicated: Secondary | ICD-10-CM | POA: Diagnosis present

## 2016-02-19 DIAGNOSIS — E669 Obesity, unspecified: Secondary | ICD-10-CM | POA: Diagnosis present

## 2016-02-19 DIAGNOSIS — E876 Hypokalemia: Secondary | ICD-10-CM | POA: Diagnosis present

## 2016-02-19 DIAGNOSIS — Z6837 Body mass index (BMI) 37.0-37.9, adult: Secondary | ICD-10-CM

## 2016-02-19 LAB — URINE MICROSCOPIC-ADD ON: Bacteria, UA: NONE SEEN

## 2016-02-19 LAB — CBC WITH DIFFERENTIAL/PLATELET
Band Neutrophils: 0 %
Basophils Absolute: 1.7 10*3/uL — ABNORMAL HIGH (ref 0.0–0.1)
Basophils Relative: 16 %
Blasts: 0 %
Eosinophils Absolute: 0 10*3/uL (ref 0.0–0.7)
Eosinophils Relative: 0 %
HCT: 42.6 % (ref 36.0–46.0)
Hemoglobin: 14.8 g/dL (ref 12.0–15.0)
Lymphocytes Relative: 10 %
Lymphs Abs: 1.1 10*3/uL (ref 0.7–4.0)
MCH: 30.1 pg (ref 26.0–34.0)
MCHC: 34.7 g/dL (ref 30.0–36.0)
MCV: 86.6 fL (ref 78.0–100.0)
Metamyelocytes Relative: 0 %
Monocytes Absolute: 0.3 10*3/uL (ref 0.1–1.0)
Monocytes Relative: 3 %
Myelocytes: 0 %
Neutro Abs: 7.4 10*3/uL (ref 1.7–7.7)
Neutrophils Relative %: 71 %
Other: 0 %
Platelets: 312 10*3/uL (ref 150–400)
Promyelocytes Absolute: 0 %
RBC: 4.92 MIL/uL (ref 3.87–5.11)
RDW: 13 % (ref 11.5–15.5)
WBC: 10.5 10*3/uL (ref 4.0–10.5)
nRBC: 0 /100 WBC

## 2016-02-19 LAB — CSF CELL COUNT WITH DIFFERENTIAL
RBC Count, CSF: 0 /mm3
RBC Count, CSF: 8 /mm3 — ABNORMAL HIGH
Tube #: 1
Tube #: 4
WBC, CSF: 1 /mm3 (ref 0–5)
WBC, CSF: 7 /mm3 — ABNORMAL HIGH (ref 0–5)

## 2016-02-19 LAB — COMPREHENSIVE METABOLIC PANEL
ALT: 27 U/L (ref 14–54)
AST: 35 U/L (ref 15–41)
Albumin: 2.8 g/dL — ABNORMAL LOW (ref 3.5–5.0)
Alkaline Phosphatase: 89 U/L (ref 38–126)
Anion gap: 10 (ref 5–15)
BUN: 13 mg/dL (ref 6–20)
CO2: 24 mmol/L (ref 22–32)
Calcium: 8.8 mg/dL — ABNORMAL LOW (ref 8.9–10.3)
Chloride: 100 mmol/L — ABNORMAL LOW (ref 101–111)
Creatinine, Ser: 0.95 mg/dL (ref 0.44–1.00)
GFR calc Af Amer: >60 mL/min (ref >60)
GFR calc non Af Amer: >60 mL/min (ref >60)
Glucose, Bld: 284 mg/dL — ABNORMAL HIGH (ref 65–99)
Potassium: 3.3 mmol/L — ABNORMAL LOW (ref 3.5–5.1)
Sodium: 134 mmol/L — ABNORMAL LOW (ref 135–145)
Total Bilirubin: 0.7 mg/dL (ref 0.3–1.2)
Total Protein: 6.6 g/dL (ref 6.5–8.1)

## 2016-02-19 LAB — URINALYSIS, ROUTINE W REFLEX MICROSCOPIC
Glucose, UA: >1000 mg/dL — AB
Ketones, ur: 40 mg/dL — AB
Nitrite: POSITIVE — AB
Protein, ur: 30 mg/dL — AB
Specific Gravity, Urine: 1.021 (ref 1.005–1.030)
pH: 5.5 (ref 5.0–8.0)

## 2016-02-19 LAB — PROTEIN AND GLUCOSE, CSF
Glucose, CSF: 171 mg/dL — ABNORMAL HIGH (ref 40–70)
Total  Protein, CSF: 66 mg/dL — ABNORMAL HIGH (ref 15–45)

## 2016-02-19 LAB — CBG MONITORING, ED
Glucose-Capillary: 297 mg/dL — ABNORMAL HIGH (ref 65–99)
Glucose-Capillary: 285 mg/dL — ABNORMAL HIGH (ref 65–99)

## 2016-02-19 LAB — PROTIME-INR
INR: 1.33 (ref 0.00–1.49)
Prothrombin Time: 16.6 seconds — ABNORMAL HIGH (ref 11.6–15.2)

## 2016-02-19 LAB — LACTIC ACID, PLASMA
Lactic Acid, Venous: 1.3 mmol/L (ref 0.5–2.0)
Lactic Acid, Venous: 1.1 mmol/L (ref 0.5–2.0)

## 2016-02-19 LAB — APTT: aPTT: 32 seconds (ref 24–37)

## 2016-02-19 LAB — GLUCOSE, CAPILLARY: Glucose-Capillary: 300 mg/dL — ABNORMAL HIGH (ref 65–99)

## 2016-02-19 LAB — PROCALCITONIN: Procalcitonin: 5.97 ng/mL

## 2016-02-19 LAB — I-STAT CG4 LACTIC ACID, ED
Lactic Acid, Venous: 1.39 mmol/L (ref 0.5–2.0)
Lactic Acid, Venous: 1.12 mmol/L (ref 0.5–2.0)

## 2016-02-19 LAB — MRSA PCR SCREENING: MRSA by PCR: NEGATIVE

## 2016-02-19 MED ORDER — SODIUM CHLORIDE 0.9 % IV BOLUS (SEPSIS)
1000.0000 mL | Freq: Once | INTRAVENOUS | Status: AC
  Administered 2016-02-19: 1000 mL via INTRAVENOUS

## 2016-02-19 MED ORDER — DEXTROSE 5 % IV SOLN
2.0000 g | Freq: Three times a day (TID) | INTRAVENOUS | Status: AC
Start: 2016-02-20 — End: 2016-02-21
  Administered 2016-02-19 – 2016-02-21 (×6): 2 g via INTRAVENOUS
  Filled 2016-02-19 (×7): qty 2

## 2016-02-19 MED ORDER — DEXTROSE 5 % IV SOLN
1.0000 g | Freq: Once | INTRAVENOUS | Status: DC
  Filled 2016-02-19: qty 1

## 2016-02-19 MED ORDER — VANCOMYCIN HCL IN DEXTROSE 750-5 MG/150ML-% IV SOLN
750.0000 mg | Freq: Once | INTRAVENOUS | Status: AC
  Administered 2016-02-19: 750 mg via INTRAVENOUS
  Filled 2016-02-19: qty 150

## 2016-02-19 MED ORDER — DEXTROSE 5 % IV SOLN: 2.0000 g | Freq: Once | INTRAVENOUS | Status: DC

## 2016-02-19 MED ORDER — FLUCONAZOLE IN SODIUM CHLORIDE 400-0.9 MG/200ML-% IV SOLN
400.0000 mg | INTRAVENOUS | Status: DC
  Administered 2016-02-19 – 2016-02-21 (×3): 400 mg via INTRAVENOUS
  Filled 2016-02-19 (×5): qty 200

## 2016-02-19 MED ORDER — ACETAMINOPHEN 325 MG PO TABS
650.0000 mg | ORAL_TABLET | ORAL | Status: DC | PRN
  Administered 2016-02-19: 650 mg via ORAL
  Filled 2016-02-19 (×2): qty 2

## 2016-02-19 MED ORDER — SODIUM CHLORIDE 0.9% FLUSH
3.0000 mL | Freq: Two times a day (BID) | INTRAVENOUS | Status: DC
  Administered 2016-02-19 – 2016-02-22 (×6): 3 mL via INTRAVENOUS

## 2016-02-19 MED ORDER — IBUPROFEN 200 MG PO TABS
400.0000 mg | ORAL_TABLET | Freq: Once | ORAL | Status: AC
  Administered 2016-02-19: 400 mg via ORAL
  Filled 2016-02-19: qty 2

## 2016-02-19 MED ORDER — DEXTROSE 5 % IV SOLN
2.0000 g | Freq: Two times a day (BID) | INTRAVENOUS | Status: DC
  Administered 2016-02-19: 2 g via INTRAVENOUS
  Filled 2016-02-19 (×2): qty 2

## 2016-02-19 MED ORDER — VANCOMYCIN HCL IN DEXTROSE 1-5 GM/200ML-% IV SOLN
1000.0000 mg | Freq: Three times a day (TID) | INTRAVENOUS | Status: AC
  Administered 2016-02-19 – 2016-02-21 (×6): 1000 mg via INTRAVENOUS
  Filled 2016-02-19 (×7): qty 200

## 2016-02-19 MED ORDER — KETOROLAC TROMETHAMINE 30 MG/ML IJ SOLN
15.0000 mg | Freq: Once | INTRAMUSCULAR | Status: AC
  Administered 2016-02-19: 15 mg via INTRAVENOUS
  Filled 2016-02-19: qty 1

## 2016-02-19 MED ORDER — PANTOPRAZOLE SODIUM 40 MG PO TBEC
40.0000 mg | DELAYED_RELEASE_TABLET | Freq: Every day | ORAL | Status: DC
  Administered 2016-02-19 – 2016-02-23 (×5): 40 mg via ORAL
  Filled 2016-02-19 (×5): qty 1

## 2016-02-19 MED ORDER — ENOXAPARIN SODIUM 40 MG/0.4ML ~~LOC~~ SOLN
40.0000 mg | SUBCUTANEOUS | Status: DC
  Administered 2016-02-19 – 2016-02-22 (×4): 40 mg via SUBCUTANEOUS
  Filled 2016-02-19 (×4): qty 0.4

## 2016-02-19 MED ORDER — INSULIN GLARGINE 100 UNIT/ML ~~LOC~~ SOLN
30.0000 [IU] | Freq: Two times a day (BID) | SUBCUTANEOUS | Status: DC
  Administered 2016-02-19 – 2016-02-20 (×2): 30 [IU] via SUBCUTANEOUS
  Filled 2016-02-19 (×3): qty 0.3

## 2016-02-19 MED ORDER — INSULIN ASPART 100 UNIT/ML ~~LOC~~ SOLN
0.0000 [IU] | Freq: Three times a day (TID) | SUBCUTANEOUS | Status: DC
  Administered 2016-02-19: 8 [IU] via SUBCUTANEOUS
  Administered 2016-02-20: 15 [IU] via SUBCUTANEOUS
  Filled 2016-02-19: qty 1

## 2016-02-19 MED ORDER — ACETAMINOPHEN 325 MG PO TABS
650.0000 mg | ORAL_TABLET | Freq: Once | ORAL | Status: AC
  Administered 2016-02-19: 650 mg via ORAL
  Filled 2016-02-19: qty 2

## 2016-02-19 MED ORDER — CEFTRIAXONE SODIUM 2 G IJ SOLR
2.0000 g | Freq: Two times a day (BID) | INTRAMUSCULAR | Status: DC
  Administered 2016-02-19: 2 g via INTRAVENOUS
  Filled 2016-02-19 (×2): qty 2

## 2016-02-19 MED ORDER — VANCOMYCIN HCL IN DEXTROSE 1-5 GM/200ML-% IV SOLN
1000.0000 mg | Freq: Once | INTRAVENOUS | Status: AC
  Administered 2016-02-19: 1000 mg via INTRAVENOUS
  Filled 2016-02-19: qty 200

## 2016-02-19 NOTE — ED Notes (Signed)
Pt. Tolerated LP without any difficulty, no leaking at the site.  Pt. Denies any  numbness or tingling of extremeties

## 2016-02-19 NOTE — ED Notes (Signed)
Pt. Refused to have In and Out Cath

## 2016-02-19 NOTE — ED Provider Notes (Signed)
CSN: [id number]     Arrival date & time 02/19/16  1058 History   First MD Initiated Contact with Patient 02/19/16 1117     Chief Complaint  Patient presents with  . Fever     (Consider location/radiation/quality/duration/timing/severity/associated sxs/prior Treatment) HPI Comments: Barbara Wang is a 50 y.o. Female with history of T2DM, obesity, and HTN presents to ED with complaint of fever, head pain, back pain back, and neck pain. She was dx with PNA at the begining of June and admitted to hospital for tx. During hospital stay,she was found to have yeast in urine and bacterial vaginosis. She was tx'd with antibiotics and antifungals and d/c'd home on antibiotics - which she is still taking; however, endorses she never fully recovered. Review of records, indicate an additional hospital admission in late May 2017 with findings of a yeast UTI, tx'd with fluconazole. Returns to ED today was onset of fever, head pain, and neck pain since yesterday that has progressively worsened. Head pain is in the occipital region with no radiation, 10/10 in pain, with associated lightheadedness. Denies changes in vision, trouble swallowing, numbness/weakness, or head trauma. She continues to endorse dysuria. Denies hematuria. Of note, she also endorses shortness of breath and left sided back pain, worse with deep inspiration. Has associated non-productive cough. Denies chest pain.  Patient is a 50 y.o. female presenting with fever. The history is provided by the patient.  Fever Associated symptoms: chills, dysuria and headaches   Associated symptoms: no chest pain, no diarrhea, no nausea, no rash, no sore throat and no vomiting     Past Medical History  Diagnosis Date  . Hypertension   . Pancreatitis   . Hyperlipemia   . History of hiatal hernia     "it went away on it's own"  . Depression   . Cellulitis 06/2015    rt hand   . Asthma   . Type II diabetes mellitus (HCC)     insulin dependent    . Neuropathy ([facility])      "feet & hands "  . Obesity    Past Surgical History  Procedure Laterality Date  . Cesarean section  1981; 1987  . Shoulder arthroscopy w/ rotator cuff repair Right   . Cholecystectomy  05/11/2012    Procedure: LAPAROSCOPIC CHOLECYSTECTOMY WITH INTRAOPERATIVE CHOLANGIOGRAM;  Surgeon: Gayland Curry, MD,FACS;  Location: [facility];  Service: General;  Laterality: N/A;  . Abdominal hysterectomy    . Tubal ligation  10/25/1999    Archie Endo 01/11/2011   No family history on file. Social History  Substance Use Topics  . Smoking status: Current Every Day Smoker -- 0.50 packs/day for 40 years    Types: Cigarettes  . Smokeless tobacco: Never Used  . Alcohol Use: No   OB History    No data available     Review of Systems  Constitutional: Positive for fever, chills and fatigue. Negative for diaphoresis.  HENT: Negative for sore throat and trouble swallowing.   Eyes: Negative for visual disturbance.  Respiratory: Positive for shortness of breath.   Cardiovascular: Negative for chest pain and palpitations.  Gastrointestinal: Negative for nausea, vomiting, abdominal pain, diarrhea, constipation and blood in stool.  Genitourinary: Positive for dysuria. Negative for hematuria.  Musculoskeletal: Positive for neck pain.  Skin: Negative for rash.  Neurological: Positive for weakness ( generalized), light-headedness and headaches. Negative for dizziness and numbness.      Allergies  Penicillins; Azithromycin; Sulfa antibiotics; and Tramadol  Home Medications  Prior to Admission medications   Medication Sig Start Date End Date Taking? Authorizing Provider  acetaminophen (TYLENOL) 650 MG CR tablet Take 1,300 mg by mouth every 4 (four) hours.   Yes Historical Provider, MD  atorvastatin (LIPITOR) 40 MG tablet Take 40 mg by mouth daily at 6 PM.    Yes Historical Provider, MD  insulin aspart (NOVOLOG) 100 UNIT/ML FlexPen CBG 70 - 120: 0 units CBG 121 - 150: 3 units CBG 151 -  200: 4 units CBG 201 - 250: 7 units CBG 251 - 300: 11 units CBG 301 - 350: 15 units CBG 351 - 400: 20 units 02/08/16  Yes Hosie Poisson, MD  Insulin Glargine (LANTUS) 100 UNIT/ML Solostar Pen Inject 50 Units into the skin 2 (two) times daily. 02/08/16  Yes Hosie Poisson, MD  levofloxacin (LEVAQUIN) 750 MG tablet Take 1 tablet (750 mg total) by mouth daily. 02/09/16  Yes Hosie Poisson, MD  lisinopril-hydrochlorothiazide (PRINZIDE,ZESTORETIC) 10-12.5 MG tablet Take 1 tablet by mouth daily. Patient taking differently: Take 1 tablet by mouth at bedtime.  01/15/16  Yes Bjorn Pippin, PA-C  pantoprazole (PROTONIX) 40 MG tablet Take 1 tablet (40 mg total) by mouth daily. 02/02/16  Yes Lisabeth Pick, MD  metroNIDAZOLE (FLAGYL) 500 MG tablet Take 1 tablet (500 mg total) by mouth every 12 (twelve) hours. Patient not taking: Reported on 02/19/2016 02/08/16   Hosie Poisson, MD  oxyCODONE (OXY IR/ROXICODONE) 5 MG immediate release tablet Take 1 tablet (5 mg total) by mouth every 12 (twelve) hours as needed for severe pain. Patient not taking: Reported on 02/19/2016 02/08/16   Hosie Poisson, MD   BP 132/94 mmHg  Pulse 120  Temp(Src) 97.8 F (36.6 C) (Oral)  Resp 27  Ht 5\' 4"  (1.626 m)  Wt 98.3 kg  BMI 37.18 kg/m2  SpO2 98% Physical Exam  Constitutional: She appears well-developed and well-nourished. She appears toxic. She appears ill.  HENT:  Head: Normocephalic and atraumatic.  Mouth/Throat: Uvula is midline and oropharynx is clear and moist. No trismus in the jaw. No uvula swelling. No oropharyngeal exudate.  Eyes: Conjunctivae and EOM are normal. Pupils are equal, round, and reactive to light. Right eye exhibits no discharge. Left eye exhibits no discharge. No scleral icterus.  Neck: Phonation normal. Spinous process tenderness and muscular tenderness present. Decreased range of motion present. No Kernig's sign noted.  Patient able to rotate neck approximately 45 degrees with pain.   Cardiovascular:  Regular rhythm, normal heart sounds and intact distal pulses.  Tachycardia present.   No murmur heard. Pulmonary/Chest: Effort normal and breath sounds normal. No respiratory distress. She has no wheezes. She has no rales.  Abdominal: Soft. Bowel sounds are normal. There is no tenderness. There is no rebound and no guarding.  Lymphadenopathy:    She has no cervical adenopathy.  Neurological: She is alert. She has normal strength. No sensory deficit.  Reflex Scores:      Bicep reflexes are 2+ on the right side and 2+ on the left side.      Patellar reflexes are 2+ on the right side and 2+ on the left side. Mental Status:  Alert, thought content appropriate, able to give a coherent history. Speech fluent without evidence of aphasia.  Cranial Nerves:  II:  Peripheral visual fields grossly normal, pupils equal, round, reactive to light III,IV, VI: ptosis not present, extra-ocular motions intact bilaterally  V,VII: smile symmetric, facial light touch sensation equal VIII: hearing grossly normal to voice  X:  uvula elevates symmetrically  XI: bilateral shoulder shrug symmetric and strong XII: midline tongue extension without fassiculations Deep Tendon Reflexes: 2+ and symmetric in the biceps and patella Cerebellar: normal finger-to-nose with bilateral upper extremities CV: distal pulses palpable throughout   Skin: Skin is warm and dry.  Psychiatric: She has a normal mood and affect. Her behavior is normal.    ED Course  Procedures (including critical care time) Labs Review Labs Reviewed  COMPREHENSIVE METABOLIC PANEL - Abnormal; Notable for the following:    Sodium 134 (*)    Potassium 3.3 (*)    Chloride 100 (*)    Glucose, Bld 284 (*)    Calcium 8.8 (*)    Albumin 2.8 (*)    All other components within normal limits  CBC WITH DIFFERENTIAL/PLATELET - Abnormal; Notable for the following:    Basophils Absolute 1.7 (*)    All other components within normal limits  URINALYSIS, ROUTINE  W REFLEX MICROSCOPIC (NOT AT [facility]) - Abnormal; Notable for the following:    Color, Urine ORANGE (*)    APPearance CLOUDY (*)    Glucose, UA >1000 (*)    Hgb urine dipstick SMALL (*)    Bilirubin Urine SMALL (*)    Ketones, ur 40 (*)    Protein, ur 30 (*)    Nitrite POSITIVE (*)    Leukocytes, UA SMALL (*)    All other components within normal limits  URINE MICROSCOPIC-ADD ON - Abnormal; Notable for the following:    Squamous Epithelial / LPF 0-5 (*)    All other components within normal limits  PROTIME-INR - Abnormal; Notable for the following:    Prothrombin Time 16.6 (*)    All other components within normal limits  PROTEIN AND GLUCOSE, CSF - Abnormal; Notable for the following:    Glucose, CSF 171 (*)    Total  Protein, CSF 66 (*)    All other components within normal limits  CSF CELL COUNT WITH DIFFERENTIAL - Abnormal; Notable for the following:    RBC Count, CSF 8 (*)    WBC, CSF 7 (*)    All other components within normal limits  COMPREHENSIVE METABOLIC PANEL - Abnormal; Notable for the following:    Potassium 3.3 (*)    Glucose, Bld 406 (*)    Calcium 8.1 (*)    Total Protein 5.9 (*)    Albumin 2.4 (*)    AST 56 (*)    All other components within normal limits  CBC - Abnormal; Notable for the following:    Hemoglobin 11.7 (*)    HCT 35.9 (*)    All other components within normal limits  GLUCOSE, CAPILLARY - Abnormal; Notable for the following:    Glucose-Capillary 300 (*)    All other components within normal limits  CBG MONITORING, ED - Abnormal; Notable for the following:    Glucose-Capillary 297 (*)    All other components within normal limits  CBG MONITORING, ED - Abnormal; Notable for the following:    Glucose-Capillary 285 (*)    All other components within normal limits  CULTURE, BLOOD (ROUTINE X 2)  CSF CULTURE  MRSA PCR SCREENING  CULTURE, BLOOD (ROUTINE X 2)  CULTURE, BLOOD (ROUTINE X 2)  CULTURE, BLOOD (ROUTINE X 2)  LACTIC ACID, PLASMA  LACTIC  ACID, PLASMA  PROCALCITONIN  APTT  CSF CELL COUNT WITH DIFFERENTIAL  I-STAT CG4 LACTIC ACID, ED  I-STAT CG4 LACTIC ACID, ED  CYTOLOGY - NON PAP    Imaging  Review Ct Head Wo Contrast  02/19/2016  CLINICAL DATA:  Awoke this morning with posterior head and neck pain, pain with movement, fever, weakness, generalized body aches, history hypertension, type II diabetes mellitus, smoker EXAM: CT HEAD WITHOUT CONTRAST CT CERVICAL SPINE WITHOUT CONTRAST TECHNIQUE: Multidetector CT imaging of the head and cervical spine was performed following the standard protocol without intravenous contrast. Multiplanar CT image reconstructions of the cervical spine were also generated. COMPARISON:  CT head 12/25/2008 FINDINGS: CT HEAD FINDINGS Normal ventricular morphology. No midline shift or mass effect. Normal appearance of brain parenchyma. No intracranial hemorrhage, mass lesion, evidence acute infarction, or extra-axial fluid collections. Mucosal retention cyst at floor of RIGHT maxillary sinus. Bones and sinuses otherwise unremarkable. CT CERVICAL SPINE FINDINGS Prevertebral soft tissues normal thickness. Disc space narrowing with endplate spur formation C4-C5 through C6-C7. Vertebral body heights maintained without fracture or subluxation. Visualized skullbase intact. Lung apices clear. IMPRESSION: Normal CT head. Degenerative disc disease changes cervical spine. No acute cervical spine abnormalities. Electronically Signed   By: Lavonia Dana M.D.   On: 02/19/2016 13:21   Ct Cervical Spine Wo Contrast  02/19/2016  CLINICAL DATA:  Awoke this morning with posterior head and neck pain, pain with movement, fever, weakness, generalized body aches, history hypertension, type II diabetes mellitus, smoker EXAM: CT HEAD WITHOUT CONTRAST CT CERVICAL SPINE WITHOUT CONTRAST TECHNIQUE: Multidetector CT imaging of the head and cervical spine was performed following the standard protocol without intravenous contrast. Multiplanar CT  image reconstructions of the cervical spine were also generated. COMPARISON:  CT head 12/25/2008 FINDINGS: CT HEAD FINDINGS Normal ventricular morphology. No midline shift or mass effect. Normal appearance of brain parenchyma. No intracranial hemorrhage, mass lesion, evidence acute infarction, or extra-axial fluid collections. Mucosal retention cyst at floor of RIGHT maxillary sinus. Bones and sinuses otherwise unremarkable. CT CERVICAL SPINE FINDINGS Prevertebral soft tissues normal thickness. Disc space narrowing with endplate spur formation C4-C5 through C6-C7. Vertebral body heights maintained without fracture or subluxation. Visualized skullbase intact. Lung apices clear. IMPRESSION: Normal CT head. Degenerative disc disease changes cervical spine. No acute cervical spine abnormalities. Electronically Signed   By: Lavonia Dana M.D.   On: 02/19/2016 13:21   Dg Chest Port 1 View  02/19/2016  CLINICAL DATA:  Fever and headache. EXAM: PORTABLE CHEST 1 VIEW COMPARISON:  02/07/2016 FINDINGS: Both lungs are clear. Heart and mediastinum are within normal limits. The trachea is midline. No acute bone abnormality. Negative for a pneumothorax. IMPRESSION: No active disease. Electronically Signed   By: Markus Daft M.D.   On: 02/19/2016 12:12   I have personally reviewed and evaluated these images and lab results as part of my medical decision-making.   EKG Interpretation   Date/Time:  Saturday February 19 2016 11:07:07 EDT Ventricular Rate:  145 PR Interval:    QRS Duration: 85 QT Interval:  291 QTC Calculation: 452 R Axis:   -28 Text Interpretation:  Sinus tachycardia Borderline left axis deviation  Confirmed by Ashok Cordia  MD, Lennette Bihari ([id number]) on 02/19/2016 11:20:02 AM      MDM   Final diagnoses:  Urinary tract infection with hematuria, site unspecified   Patient is febrile and tachycardic. She is ill-appearing and in discomfort. Patient is able to rotate neck with pain, some TTP of neck musculature.  Negative kernigs. Sepsis orders initiated, IVF initiatied. Tylenol and ibuprofen given. CXR negative. Lactic acid normal. EKG shows sinus tachycardia. CT head normal. CBC unremarkable. CMP shows hyperglycemia, most likely infectious in etiology. Hyponatremic and hypokalemic -  may be secondary to hyperlycemia. U/A positive UTI and yeast present on microscopic. Continue IVF. Discussed pt with Dr. Ashok Cordia, who also evaluated pt. Suspect sepsis with UTI likely source. Broad spectrum ABX initiated and pharmacy consult for fluconazole initiated. Consult to hospitalist for admission.   Consulted Dr. Janne Napoleon, greatly appreciated her time. Will admit for further evaluation and management of sepsis in setting of UTI.     [address] [place], [place] 02/20/16 [id number]  Lajean Saver, MD 02/20/16 [phone]

## 2016-02-19 NOTE — H&P (Signed)
[facility] History and Physical  GENECIS SCHMOLL [id number] DOB: June 13, 1966 DOA: 02/19/2016  Referring physician: ED PCP: Default, Provider, MD   Chief Complaint: fever  HPI: Barbara Wang is a 50 y.o. female with significant past history hypertension, diabetes on insulin, peripheral neuropathy secondary to diabetes, obesity, recent admission for idiopathic and cryptitis and also recent admission on 02/05/2016 for healthcare acquired pneumonia.  Of note, during that hospitalization, she grew out yeast in Urine cultures, and has vaginosis as well.  She was discharged with oral Flagyl and levofloxacin.  Per patient, ever since she was discharged, she still never felt quite the same, with  complaints of dysuria.  She presents to the ED today feeling worse with neck pain, headache, fevers, n/v as well as chills and rigors at home. She denies any chest pain or palpitations or sore throat.   In ED, tachycardic 140s, tm 102.2, hypotensive 84/66 (lowest bp).  Sepsis protocol initiated, pt received vancomycin, 30mg  /kg NS bolus, about to receive cefepime and fluconazole.  Review of Systems:  Per HPI, o/w all systems reviewed and negative.   Past Medical History  Diagnosis Date  . Hypertension   . Pancreatitis   . Hyperlipemia   . History of hiatal hernia     "it went away on it's own"  . Depression   . Cellulitis 06/2015    rt hand   . Asthma   . Type II diabetes mellitus (HCC)     insulin dependent   . Neuropathy ([place])      "feet & hands "  . Obesity    Past Surgical History  Procedure Laterality Date  . Cesarean section  1981; 1987  . Shoulder arthroscopy w/ rotator cuff repair Right   . Cholecystectomy  05/11/2012    Procedure: LAPAROSCOPIC CHOLECYSTECTOMY WITH INTRAOPERATIVE CHOLANGIOGRAM;  Surgeon: Gayland Curry, MD,FACS;  Location: [facility];  Service: General;  Laterality: N/A;  . Abdominal hysterectomy    . Tubal ligation  10/25/1999    Archie Endo 01/11/2011    Social History:  reports that she has been smoking Cigarettes.  She has a 20 pack-year smoking history. She has never used smokeless tobacco. She reports that she does not drink alcohol or use illicit drugs.  Allergies  Allergen Reactions  . Penicillins Hives and Shortness Of Breath    Has patient had a PCN reaction causing immediate rash, facial/tongue/throat swelling, SOB or lightheadedness with hypotension: Yes Has patient had a PCN reaction causing severe rash involving mucus membranes or skin necrosis: No Has patient had a PCN reaction that required hospitalization No Has patient had a PCN reaction occurring within the last 10 years: No If all of the above answers are "NO", then may proceed with Cephalosporin use.  . Azithromycin Hives  . Sulfa Antibiotics Hives  . Tramadol Hives    No family history on file. reviewed, noncontributory.  Prior to Admission medications   Medication Sig Start Date End Date Taking? Authorizing Provider  acetaminophen (TYLENOL) 650 MG CR tablet Take 1,300 mg by mouth every 4 (four) hours.   Yes Historical Provider, MD  atorvastatin (LIPITOR) 40 MG tablet Take 40 mg by mouth daily at 6 PM.    Yes Historical Provider, MD  insulin aspart (NOVOLOG) 100 UNIT/ML FlexPen CBG 70 - 120: 0 units CBG 121 - 150: 3 units CBG 151 - 200: 4 units CBG 201 - 250: 7 units CBG 251 - 300: 11 units CBG 301 - 350: 15 units CBG 351 -  400: 20 units 02/08/16  Yes Hosie Poisson, MD  Insulin Glargine (LANTUS) 100 UNIT/ML Solostar Pen Inject 50 Units into the skin 2 (two) times daily. 02/08/16  Yes Hosie Poisson, MD  levofloxacin (LEVAQUIN) 750 MG tablet Take 1 tablet (750 mg total) by mouth daily. 02/09/16  Yes Hosie Poisson, MD  lisinopril-hydrochlorothiazide (PRINZIDE,ZESTORETIC) 10-12.5 MG tablet Take 1 tablet by mouth daily. Patient taking differently: Take 1 tablet by mouth at bedtime.  01/15/16  Yes Bjorn Pippin, PA-C  pantoprazole (PROTONIX) 40 MG tablet Take 1 tablet  (40 mg total) by mouth daily. 02/02/16  Yes Lisabeth Pick, MD  metroNIDAZOLE (FLAGYL) 500 MG tablet Take 1 tablet (500 mg total) by mouth every 12 (twelve) hours. Patient not taking: Reported on 02/19/2016 02/08/16   Hosie Poisson, MD  oxyCODONE (OXY IR/ROXICODONE) 5 MG immediate release tablet Take 1 tablet (5 mg total) by mouth every 12 (twelve) hours as needed for severe pain. Patient not taking: Reported on 02/19/2016 02/08/16   Hosie Poisson, MD   Physical Exam: Filed Vitals:   02/19/16 1430 02/19/16 1500 02/19/16 1515 02/19/16 1518  BP: 97/60 89/50 84/66    Pulse: 139 135 134   Temp:    97.8 F (36.6 C)  TempSrc:    Oral  Resp: 24 26 31    SpO2: 99% 96% 99%     Wt Readings from Last 3 Encounters:  02/05/16 88.043 kg (194 lb 1.6 oz)  02/02/16 87.454 kg (192 lb 12.8 oz)  01/21/16 89.9 kg (198 lb 3.1 oz)    General: ill appearing,  pleasant, NAD, AAOx3, morbid obese Eyes: PERRL, normal lids, irises & conjunctiva ENT: grossly normal hearing, lips & tongue, dry mmm, +nuchal regidity. Neck: no LAD, no jvd Cardiovascular: RRR, tachycardic, no m/r/g. No LE edema. Telemetry: ST 140s Respiratory: CTA bilaterally, no w/r/c. Normal respiratory effort. Abdomen: soft, ntnd, obese, difficult exam 2nd to body habitus Skin: no rash or induration seen on limited exam Musculoskeletal: grossly normal tone BUE/BLE Psychiatric: grossly normal mood and affect, speech fluent and appropriate Neurologic: grossly non-focal.          Labs on Admission:  Basic Metabolic Panel:  Recent Labs Lab 02/19/16 1150  NA 134*  K 3.3*  CL 100*  CO2 24  GLUCOSE 284*  BUN 13  CREATININE 0.95  CALCIUM 8.8*   Liver Function Tests:  Recent Labs Lab 02/19/16 1150  AST 35  ALT 27  ALKPHOS 89  BILITOT 0.7  PROT 6.6  ALBUMIN 2.8*   No results for input(s): LIPASE, AMYLASE in the last 168 hours. No results for input(s): AMMONIA in the last 168 hours. CBC:  Recent Labs Lab 02/19/16 1150  WBC 10.5   NEUTROABS 7.4  HGB 14.8  HCT 42.6  MCV 86.6  PLT 312   Cardiac Enzymes: No results for input(s): CKTOTAL, CKMB, CKMBINDEX, TROPONINI in the last 168 hours.  BNP (last 3 results)  Recent Labs  02/05/16 0328  BNP 16.8    ProBNP (last 3 results) No results for input(s): PROBNP in the last 8760 hours.  CBG:  Recent Labs Lab 02/19/16 1121  GLUCAP 297*    Radiological Exams on Admission: Ct Head Wo Contrast  02/19/2016  CLINICAL DATA:  Awoke this morning with posterior head and neck pain, pain with movement, fever, weakness, generalized body aches, history hypertension, type II diabetes mellitus, smoker EXAM: CT HEAD WITHOUT CONTRAST CT CERVICAL SPINE WITHOUT CONTRAST TECHNIQUE: Multidetector CT imaging of the head and cervical spine was performed  following the standard protocol without intravenous contrast. Multiplanar CT image reconstructions of the cervical spine were also generated. COMPARISON:  CT head 12/25/2008 FINDINGS: CT HEAD FINDINGS Normal ventricular morphology. No midline shift or mass effect. Normal appearance of brain parenchyma. No intracranial hemorrhage, mass lesion, evidence acute infarction, or extra-axial fluid collections. Mucosal retention cyst at floor of RIGHT maxillary sinus. Bones and sinuses otherwise unremarkable. CT CERVICAL SPINE FINDINGS Prevertebral soft tissues normal thickness. Disc space narrowing with endplate spur formation C4-C5 through C6-C7. Vertebral body heights maintained without fracture or subluxation. Visualized skullbase intact. Lung apices clear. IMPRESSION: Normal CT head. Degenerative disc disease changes cervical spine. No acute cervical spine abnormalities. Electronically Signed   By: Lavonia Dana M.D.   On: 02/19/2016 13:21   Ct Cervical Spine Wo Contrast  02/19/2016  CLINICAL DATA:  Awoke this morning with posterior head and neck pain, pain with movement, fever, weakness, generalized body aches, history hypertension, type II diabetes  mellitus, smoker EXAM: CT HEAD WITHOUT CONTRAST CT CERVICAL SPINE WITHOUT CONTRAST TECHNIQUE: Multidetector CT imaging of the head and cervical spine was performed following the standard protocol without intravenous contrast. Multiplanar CT image reconstructions of the cervical spine were also generated. COMPARISON:  CT head 12/25/2008 FINDINGS: CT HEAD FINDINGS Normal ventricular morphology. No midline shift or mass effect. Normal appearance of brain parenchyma. No intracranial hemorrhage, mass lesion, evidence acute infarction, or extra-axial fluid collections. Mucosal retention cyst at floor of RIGHT maxillary sinus. Bones and sinuses otherwise unremarkable. CT CERVICAL SPINE FINDINGS Prevertebral soft tissues normal thickness. Disc space narrowing with endplate spur formation C4-C5 through C6-C7. Vertebral body heights maintained without fracture or subluxation. Visualized skullbase intact. Lung apices clear. IMPRESSION: Normal CT head. Degenerative disc disease changes cervical spine. No acute cervical spine abnormalities. Electronically Signed   By: Lavonia Dana M.D.   On: 02/19/2016 13:21   Dg Chest Port 1 View  02/19/2016  CLINICAL DATA:  Fever and headache. EXAM: PORTABLE CHEST 1 VIEW COMPARISON:  02/07/2016 FINDINGS: Both lungs are clear. Heart and mediastinum are within normal limits. The trachea is midline. No acute bone abnormality. Negative for a pneumothorax. IMPRESSION: No active disease. Electronically Signed   By: Markus Daft M.D.   On: 02/19/2016 12:12    EKG: Independently reviewed.   EKG Interpretation  Date/Time:  Saturday February 19 2016 11:07:07 EDT Ventricular Rate:  145 PR Interval:    QRS Duration: 85 QT Interval:  291 QTC Calculation: 452 R Axis:   -28 Text Interpretation:  Sinus tachycardia Borderline left axis deviation Confirmed by STEINL  MD, Lennette Bihari ([id number]) on 02/19/2016 11:20:02 AM        Assessment/Plan Principal Problem:   Sepsis ([place]) Active Problems:   UTI  (lower urinary tract infection)   Diabetes mellitus ([place])   Obesity   1. Sepsis - likely source is UTI, w/ hypotension, tachcardia, fevers, but w/ nuchal regidity. - admit Stepdown - pancx, emp abx w/ vancomycin, cefepime, rocephin 2 g q12h, fluconazole, pharm to assist w/ dosing - r/o meningitis, c/s Neuro, Dr Leonel Ramsay, who is about to do LP now. Appreciate assisstance greatly. - sepsis protocol, trend lactic acid, chk procalcitonin -  2. Uti, w/ hx of +yeast in urine - f/u ucx,  - ct abd 01/21/16 w/ no renal stones noted at time.  3. Dm, uncontrolled, insulin dependent, w/ dm neuropathy - accuchks - riss - reduced lantus to 30bid (from home 50bid) for now.  4. Morbid obesity  5. Hx of htn - holding  antihypertensives in setting of sepsis.   Code Status: FULL DVT Prophylaxis: lovenox 40 Family Communication: patient Disposition Plan:  inpt stepdown  Time spent: 22mins  Maren Reamer MD., MBA/MHA [facility] Pager [phone]

## 2016-02-19 NOTE — ED Notes (Signed)
Checked patient blood sugar it was 297 notified RN of blood sugar

## 2016-02-19 NOTE — ED Notes (Signed)
Placed patient into a gown and on the monitor 

## 2016-02-19 NOTE — Progress Notes (Signed)
Pharmacy Antibiotic Note  Barbara Wang is a 58F with PMH significant for DM, HTN, HLD, depression, and asthma presenting with fever. Pharmacy to dose cefepime, vancomycin and fluconazole for possible sepsis 2/2 UTI. On course of levaquin PTA for PNA. Also prescribed metronidazole PTA for trichomoniasis but denies taking medication per med rec. Tmax 102.2, SCr 0.95, CrCl ~95 mL/min. Allergy to penicillins (SOB). Discussed with MD and would like to proceed with cefepime. WBC WNL. Received vancomycin 1g in ED  Plan: -Vancomycin 750mg  x1 followed by 1000mg  q8h -Cefepime 2g q12h -Fluconazole 400mg  q24h -F/U cultures, clinical progress     Temp (24hrs), Avg:100.4 F (38 C), Min:97.8 F (36.6 C), Max:102.2 F (39 C)   Recent Labs Lab 02/19/16 1138 02/19/16 1150  WBC  --  10.5  CREATININE  --  0.95  LATICACIDVEN 1.39  --     CrCl cannot be calculated (Unknown ideal weight.).    Allergies  Allergen Reactions  . Penicillins Hives and Shortness Of Breath    Has patient had a PCN reaction causing immediate rash, facial/tongue/throat swelling, SOB or lightheadedness with hypotension: Yes Has patient had a PCN reaction causing severe rash involving mucus membranes or skin necrosis: No Has patient had a PCN reaction that required hospitalization No Has patient had a PCN reaction occurring within the last 10 years: No If all of the above answers are "NO", then may proceed with Cephalosporin use.  . Azithromycin Hives  . Sulfa Antibiotics Hives  . Tramadol Hives    Antimicrobials this admission: Vanc 6/24>> Cefepime 6/24>> Fluconazole 6/24>>  Thank you for allowing pharmacy to be a part of this patient's care.  Stephens November, PharmD Clinical Pharmacy Resident 3:37 PM, 02/19/2016

## 2016-02-19 NOTE — ED Notes (Signed)
Pt. Has been treating pneumonia and UTI and yesterday she has developed a fever and has progressively feeling worse.  She woke up this AM with posterior neck pain and head pain.   She is alert and oriented X4   She is very weak .  She is having generalized body aches.

## 2016-02-19 NOTE — Procedures (Signed)
I was asked by the internal medicine team for assistance with lumbar puncture.   Indication: Fever, meningismus.   Risks of the procedure were dicussed with the patient including post-LP headache, bleeding, infection, weakness/numbness of legs(radiculopathy), death.  The patient agreed and written consent was obtained.   The patient was prepped and draped, and using sterile technique a 20 gauge quinke spinal needle was inserted in the L4-5 space. The opening pressure was 17 cm H2O. Approximately 6 cc of CSF were obtained and sent for analysis.   Will defer follow up of labs to IM team. Please call with any further questions or concerns.   Roland Rack, MD [facility] [phone]  If 7pm- 7am, please page neurology on call as listed in [place].

## 2016-02-20 LAB — COMPREHENSIVE METABOLIC PANEL
ALT: 37 U/L (ref 14–54)
AST: 56 U/L — ABNORMAL HIGH (ref 15–41)
Albumin: 2.4 g/dL — ABNORMAL LOW (ref 3.5–5.0)
Alkaline Phosphatase: 78 U/L (ref 38–126)
Anion gap: 6 (ref 5–15)
BUN: 14 mg/dL (ref 6–20)
CO2: 26 mmol/L (ref 22–32)
Calcium: 8.1 mg/dL — ABNORMAL LOW (ref 8.9–10.3)
Chloride: 105 mmol/L (ref 101–111)
Creatinine, Ser: 0.93 mg/dL (ref 0.44–1.00)
GFR calc Af Amer: >60 mL/min (ref >60)
GFR calc non Af Amer: >60 mL/min (ref >60)
Glucose, Bld: 406 mg/dL — ABNORMAL HIGH (ref 65–99)
Potassium: 3.3 mmol/L — ABNORMAL LOW (ref 3.5–5.1)
Sodium: 137 mmol/L (ref 135–145)
Total Bilirubin: 0.4 mg/dL (ref 0.3–1.2)
Total Protein: 5.9 g/dL — ABNORMAL LOW (ref 6.5–8.1)

## 2016-02-20 LAB — CBC
HCT: 35.9 % — ABNORMAL LOW (ref 36.0–46.0)
Hemoglobin: 11.7 g/dL — ABNORMAL LOW (ref 12.0–15.0)
MCH: 28.8 pg (ref 26.0–34.0)
MCHC: 32.6 g/dL (ref 30.0–36.0)
MCV: 88.4 fL (ref 78.0–100.0)
Platelets: 338 10*3/uL (ref 150–400)
RBC: 4.06 MIL/uL (ref 3.87–5.11)
RDW: 13.1 % (ref 11.5–15.5)
WBC: 8.7 10*3/uL (ref 4.0–10.5)

## 2016-02-20 LAB — GLUCOSE, CAPILLARY
Glucose-Capillary: 390 mg/dL — ABNORMAL HIGH (ref 65–99)
Glucose-Capillary: 336 mg/dL — ABNORMAL HIGH (ref 65–99)
Glucose-Capillary: 297 mg/dL — ABNORMAL HIGH (ref 65–99)
Glucose-Capillary: 254 mg/dL — ABNORMAL HIGH (ref 65–99)

## 2016-02-20 MED ORDER — POTASSIUM CHLORIDE CRYS ER 20 MEQ PO TBCR
40.0000 meq | EXTENDED_RELEASE_TABLET | Freq: Once | ORAL | Status: AC
  Administered 2016-02-20: 40 meq via ORAL
  Filled 2016-02-20: qty 2

## 2016-02-20 MED ORDER — KETOROLAC TROMETHAMINE 30 MG/ML IJ SOLN
15.0000 mg | Freq: Once | INTRAMUSCULAR | Status: AC
  Administered 2016-02-20: 15 mg via INTRAVENOUS
  Filled 2016-02-20: qty 1

## 2016-02-20 MED ORDER — INSULIN ASPART 100 UNIT/ML ~~LOC~~ SOLN
0.0000 [IU] | Freq: Three times a day (TID) | SUBCUTANEOUS | Status: DC
  Administered 2016-02-20: 11 [IU] via SUBCUTANEOUS
  Administered 2016-02-20: 15 [IU] via SUBCUTANEOUS
  Administered 2016-02-21: 7 [IU] via SUBCUTANEOUS
  Administered 2016-02-21: 11 [IU] via SUBCUTANEOUS
  Administered 2016-02-21: 4 [IU] via SUBCUTANEOUS
  Administered 2016-02-22: 7 [IU] via SUBCUTANEOUS
  Administered 2016-02-22 (×2): 3 [IU] via SUBCUTANEOUS
  Administered 2016-02-23: 7 [IU] via SUBCUTANEOUS

## 2016-02-20 MED ORDER — IBUPROFEN 400 MG PO TABS
400.0000 mg | ORAL_TABLET | Freq: Four times a day (QID) | ORAL | Status: DC | PRN
  Administered 2016-02-21 – 2016-02-23 (×4): 400 mg via ORAL
  Filled 2016-02-20 (×2): qty 1
  Filled 2016-02-20: qty 2
  Filled 2016-02-20: qty 1

## 2016-02-20 MED ORDER — ONDANSETRON HCL 4 MG/2ML IJ SOLN
4.0000 mg | Freq: Four times a day (QID) | INTRAMUSCULAR | Status: DC | PRN
  Administered 2016-02-20: 4 mg via INTRAVENOUS
  Filled 2016-02-20: qty 2

## 2016-02-20 MED ORDER — INSULIN GLARGINE 100 UNIT/ML ~~LOC~~ SOLN
40.0000 [IU] | Freq: Two times a day (BID) | SUBCUTANEOUS | Status: DC
Start: 2016-02-20 — End: 2016-02-21
  Administered 2016-02-20 – 2016-02-21 (×2): 40 [IU] via SUBCUTANEOUS
  Filled 2016-02-20 (×3): qty 0.4

## 2016-02-20 MED ORDER — ACETAMINOPHEN 325 MG PO TABS
650.0000 mg | ORAL_TABLET | ORAL | Status: DC | PRN
  Administered 2016-02-23: 650 mg via ORAL
  Filled 2016-02-20: qty 2

## 2016-02-20 MED ORDER — METHOCARBAMOL 750 MG PO TABS: 750.0000 mg | ORAL_TABLET | Freq: Four times a day (QID) | ORAL | Status: DC | PRN

## 2016-02-20 MED ORDER — INSULIN ASPART 100 UNIT/ML ~~LOC~~ SOLN
0.0000 [IU] | Freq: Every day | SUBCUTANEOUS | Status: DC
  Administered 2016-02-20: 3 [IU] via SUBCUTANEOUS

## 2016-02-20 MED ORDER — OXYCODONE HCL 5 MG PO TABS
5.0000 mg | ORAL_TABLET | ORAL | Status: DC | PRN
  Administered 2016-02-20 – 2016-02-23 (×9): 10 mg via ORAL
  Filled 2016-02-20 (×9): qty 2

## 2016-02-20 NOTE — Progress Notes (Signed)
Pt is having continuous, severe, 10/10 neck pain.  Toradol has helped her sleep but the pain does not ever go away.  RN will continue to monitor.

## 2016-02-20 NOTE — Progress Notes (Signed)
Utilization Review Completed.Barbara Wang T6/25/2017  

## 2016-02-20 NOTE — Progress Notes (Signed)
[facility] TEAM 1 - Stepdown/ICU TEAM  Barbara Wang  [id number] DOB: [birth year] DOA: 02/19/2016 PCP: Default, Provider, MD    Brief Narrative:  50 y.o. female with a history of hypertension, diabetes on insulin, peripheral neuropathy, obesity, recent admission for idiopathic pancreatitis, and a recent admission (02/05/2016) for healthcare acquired pneumonia. She was discharged with oral Flagyl and levofloxacin. Per patient she never felt quite the same after her d/c, with ongoing dysuria. She presented to the ED feeling worse with neck pain, headache, fevers, n/v as well as chills and rigors at home.   In the ED she was tachycardic in the 140s, febrile to 102.2, and hypotensive at 84/66. The sepsis protocol was initiated, and the pt received vancomycin, 30mg  /kg NS bolus, cefepime, and fluconazole.  Subjective: The patient complains of ongoing severe neck pain limiting her ability to turn her head either way up or down.  She reports an ongoing modest headache.  She denies fevers chills shortness of breath or abdominal pain.  She does report some left flank pain.  Assessment & Plan:  Headache - Nuchal rigidity LP was obtained with results not consistent with bacterial meningitis (cell count unremarkable - protein elevated - glucose elevated - cx pending) - HIV negative 02/05/16 - send CSF for HSV PCR  Sepsis due to UTI  ?fungal UTI (>100k yeast 02/05/16 urine cx) - follow  Hypokalemia  Replace and follow  Normocytic anemia  likely due to smoldering illness - check Fe studies  DM - uncontrolled, insulin dependent CBG poorly controlled - adjust tx and follow   Obesity - Body mass index is 37.18 kg/(m^2).  HLD Hold medical tx until intake more consistent   Hx of HTN BP has stabilized/is climbing - resume tx in a stepwise fashion  DVT prophylaxis: lovenox  Code Status: FULL CODE Family Communication: no family present at time of exam  Disposition Plan: SDU - probable  transfer to medical bed 6/26  Consultants:  Neurology (for LP only)  Procedures: 6/24 LP  Antimicrobials:  Cefepime 6/24 > Diflucan 6/24> Vanc 6/24 > Rocephin 6/24  Objective: Blood pressure 144/84, pulse 76, temperature 97.7 F (36.5 C), temperature source Oral, resp. rate 24, height 5\' 4"  (1.626 m), weight 98.3 kg (216 lb 11.4 oz), SpO2 100 %.  Intake/Output Summary (Last 24 hours) at 02/20/16 0916 Last data filed at 02/20/16 0808  Gross per 24 hour  Intake   1290 ml  Output   1450 ml  Net   -160 ml   Filed Weights   02/19/16 1844  Weight: 98.3 kg (216 lb 11.4 oz)    Examination: General: No acute respiratory distress - limited range of motion in the neck with pain induced on attempts to move left right up or down Lungs: Clear to auscultation bilaterally without wheezes or crackles Cardiovascular: Regular rate and rhythm without murmur gallop or rub normal S1 and S2 Abdomen: Nontender, overweight, soft, bowel sounds positive, no rebound, no ascites, no appreciable mass Extremities: No significant cyanosis, clubbing, or edema bilateral lower extremities  CBC:  Recent Labs Lab 02/19/16 1150 02/20/16 0304  WBC 10.5 8.7  NEUTROABS 7.4  --   HGB 14.8 11.7*  HCT 42.6 35.9*  MCV 86.6 88.4  PLT 312 [id number]   Basic Metabolic Panel:  Recent Labs Lab 02/19/16 1150 02/20/16 0304  NA 134* 137  K 3.3* 3.3*  CL 100* 105  CO2 24 26  GLUCOSE 284* 406*  BUN 13 14  CREATININE 0.95 0.93  CALCIUM 8.8* 8.1*   GFR: Estimated Creatinine Clearance: 80.5 mL/min (by C-G formula based on Cr of 0.93).  Liver Function Tests:  Recent Labs Lab 02/19/16 1150 02/20/16 0304  AST 35 56*  ALT 27 37  ALKPHOS 89 78  BILITOT 0.7 0.4  PROT 6.6 5.9*  ALBUMIN 2.8* 2.4*    Coagulation Profile:  Recent Labs Lab 02/19/16 1801  INR 1.33    Cardiac Enzymes: No results for input(s): CKTOTAL, CKMB, CKMBINDEX, TROPONINI in the last 168 hours.  HbA1C: HGB A1C MFR BLD    Date/Time Value Ref Range Status  02/05/2016 07:30 AM 12.7* 4.8 - 5.6 % Final    Comment:    (NOTE)         Pre-diabetes: 5.7 - 6.4         Diabetes: >6.4         Glycemic control for adults with diabetes: <7.0   01/21/2016 10:45 AM 12.2* 4.8 - 5.6 % Final    Comment:    (NOTE)         Pre-diabetes: 5.7 - 6.4         Diabetes: >6.4         Glycemic control for adults with diabetes: <7.0     CBG:  Recent Labs Lab 02/19/16 1121 02/19/16 1624 02/19/16 2215 02/20/16 0758  GLUCAP 297* 285* 300* 390*    Recent Results (from the past 240 hour(s))  Blood Culture (routine x 2)     Status: None (Preliminary result)   Collection Time: 02/19/16 11:35 AM  Result Value Ref Range Status   Specimen Description BLOOD LEFT HAND  Final   Special Requests BOTTLES DRAWN AEROBIC AND ANAEROBIC  5CC  Final   Culture PENDING  Incomplete   Report Status PENDING  Incomplete  CSF culture with Stat gram stain     Status: None (Preliminary result)   Collection Time: 02/19/16  4:35 PM  Result Value Ref Range Status   Specimen Description CSF  Final   Special Requests Normal  Final   Gram Stain   Final    WBC PRESENT, PREDOMINANTLY MONONUCLEAR NO ORGANISMS SEEN CYTOSPIN SMEAR    Culture NO GROWTH < 24 HOURS  Final   Report Status PENDING  Incomplete  MRSA PCR Screening     Status: None   Collection Time: 02/19/16  6:46 PM  Result Value Ref Range Status   MRSA by PCR NEGATIVE NEGATIVE Final    Comment:        The GeneXpert MRSA Assay (FDA approved for NASAL specimens only), is one component of a comprehensive MRSA colonization surveillance program. It is not intended to diagnose MRSA infection nor to guide or monitor treatment for MRSA infections.      Scheduled Meds: . ceFEPime (MAXIPIME) IV  2 g Intravenous Q8H  . enoxaparin (LOVENOX) injection  40 mg Subcutaneous Q24H  . fluconazole (DIFLUCAN) IV  400 mg Intravenous Q24H  . insulin aspart  0-15 Units Subcutaneous TID WC  .  insulin glargine  30 Units Subcutaneous BID  . pantoprazole  40 mg Oral Daily  . sodium chloride flush  3 mL Intravenous Q12H  . vancomycin  1,000 mg Intravenous Q8H     LOS: 1 day   Time spent: 35 minutes   Cherene Altes, MD [facility] Office  [phone] Pager - Text Page per Amion as per below:  On-Call/Text Page:      [email or web address]      password [email or web address]  If  7PM-7AM, please contact night-coverage [email or web address] Password [email or web address] 02/20/2016, 9:16 AM

## 2016-02-21 DIAGNOSIS — G030 Nonpyogenic meningitis: Secondary | ICD-10-CM

## 2016-02-21 LAB — COMPREHENSIVE METABOLIC PANEL
ALT: 36 U/L (ref 14–54)
AST: 38 U/L (ref 15–41)
Albumin: 2.6 g/dL — ABNORMAL LOW (ref 3.5–5.0)
Alkaline Phosphatase: 74 U/L (ref 38–126)
Anion gap: 6 (ref 5–15)
BUN: 13 mg/dL (ref 6–20)
CO2: 24 mmol/L (ref 22–32)
Calcium: 8.8 mg/dL — ABNORMAL LOW (ref 8.9–10.3)
Chloride: 106 mmol/L (ref 101–111)
Creatinine, Ser: 0.83 mg/dL (ref 0.44–1.00)
GFR calc Af Amer: >60 mL/min (ref >60)
GFR calc non Af Amer: >60 mL/min (ref >60)
Glucose, Bld: 246 mg/dL — ABNORMAL HIGH (ref 65–99)
Potassium: 3.7 mmol/L (ref 3.5–5.1)
Sodium: 136 mmol/L (ref 135–145)
Total Bilirubin: 0.5 mg/dL (ref 0.3–1.2)
Total Protein: 6.1 g/dL — ABNORMAL LOW (ref 6.5–8.1)

## 2016-02-21 LAB — FERRITIN: Ferritin: 188 ng/mL (ref 11–307)

## 2016-02-21 LAB — URINE CULTURE: Culture: NO GROWTH

## 2016-02-21 LAB — RETICULOCYTES
RBC.: 3.94 MIL/uL (ref 3.87–5.11)
Retic Count, Absolute: 67 10*3/uL (ref 19.0–186.0)
Retic Ct Pct: 1.7 % (ref 0.4–3.1)

## 2016-02-21 LAB — IRON AND TIBC
Iron: 39 ug/dL (ref 28–170)
Saturation Ratios: 16 % (ref 10.4–31.8)
TIBC: 248 ug/dL — ABNORMAL LOW (ref 250–450)
UIBC: 209 ug/dL

## 2016-02-21 LAB — HERPES SIMPLEX VIRUS(HSV) DNA BY PCR
HSV 1 DNA: NEGATIVE
HSV 2 DNA: NEGATIVE

## 2016-02-21 LAB — CBC
HCT: 34.1 % — ABNORMAL LOW (ref 36.0–46.0)
Hemoglobin: 10.8 g/dL — ABNORMAL LOW (ref 12.0–15.0)
MCH: 27.4 pg (ref 26.0–34.0)
MCHC: 31.7 g/dL (ref 30.0–36.0)
MCV: 86.5 fL (ref 78.0–100.0)
Platelets: 326 10*3/uL (ref 150–400)
RBC: 3.94 MIL/uL (ref 3.87–5.11)
RDW: 13.1 % (ref 11.5–15.5)
WBC: 7.5 10*3/uL (ref 4.0–10.5)

## 2016-02-21 LAB — GLUCOSE, CAPILLARY
Glucose-Capillary: 207 mg/dL — ABNORMAL HIGH (ref 65–99)
Glucose-Capillary: 261 mg/dL — ABNORMAL HIGH (ref 65–99)
Glucose-Capillary: 155 mg/dL — ABNORMAL HIGH (ref 65–99)
Glucose-Capillary: 129 mg/dL — ABNORMAL HIGH (ref 65–99)

## 2016-02-21 LAB — FOLATE: Folate: 9.6 ng/mL (ref >5.9)

## 2016-02-21 LAB — VITAMIN B12: Vitamin B-12: 279 pg/mL (ref 180–914)

## 2016-02-21 MED ORDER — INSULIN GLARGINE 100 UNIT/ML ~~LOC~~ SOLN
50.0000 [IU] | Freq: Two times a day (BID) | SUBCUTANEOUS | Status: DC
Start: 2016-02-21 — End: 2016-02-23
  Administered 2016-02-21 – 2016-02-23 (×4): 50 [IU] via SUBCUTANEOUS
  Filled 2016-02-21 (×5): qty 0.5

## 2016-02-21 MED ORDER — LISINOPRIL 10 MG PO TABS
10.0000 mg | ORAL_TABLET | Freq: Every day | ORAL | Status: DC
  Administered 2016-02-21 – 2016-02-23 (×3): 10 mg via ORAL
  Filled 2016-02-21 (×3): qty 1

## 2016-02-21 MED ORDER — ATORVASTATIN CALCIUM 40 MG PO TABS
40.0000 mg | ORAL_TABLET | Freq: Every day | ORAL | Status: DC
Start: 2016-02-21 — End: 2016-02-23
  Administered 2016-02-21 – 2016-02-22 (×2): 40 mg via ORAL
  Filled 2016-02-21 (×2): qty 1

## 2016-02-21 MED ORDER — CYANOCOBALAMIN 1000 MCG/ML IJ SOLN
1000.0000 ug | Freq: Once | INTRAMUSCULAR | Status: AC
  Administered 2016-02-21: 1000 ug via SUBCUTANEOUS
  Filled 2016-02-21: qty 1

## 2016-02-21 MED ORDER — HYDROCHLOROTHIAZIDE 12.5 MG PO CAPS
12.5000 mg | ORAL_CAPSULE | Freq: Every day | ORAL | Status: DC
  Administered 2016-02-21 – 2016-02-23 (×3): 12.5 mg via ORAL
  Filled 2016-02-21 (×3): qty 1

## 2016-02-21 MED ORDER — POTASSIUM CHLORIDE CRYS ER 20 MEQ PO TBCR
40.0000 meq | EXTENDED_RELEASE_TABLET | Freq: Once | ORAL | Status: AC
  Administered 2016-02-21: 40 meq via ORAL
  Filled 2016-02-21: qty 2

## 2016-02-21 MED ORDER — LISINOPRIL-HYDROCHLOROTHIAZIDE 10-12.5 MG PO TABS: 1.0000 | ORAL_TABLET | Freq: Every day | ORAL | Status: DC

## 2016-02-21 NOTE — Progress Notes (Signed)
Inpatient Diabetes Program Recommendations  AACE/ADA: New Consensus Statement on Inpatient Glycemic Control (2015)  Target Ranges:  Prepandial:   less than 140 mg/dL      Peak postprandial:   less than 180 mg/dL (1-2 hours)      Critically ill patients:  140 - 180 mg/dL   Lab Results  Component Value Date   GLUCAP 261* 02/21/2016   HGBA1C 12.7* 02/05/2016    Review of Glycemic Control:  Results for ENNIS, CRITE (MRN [id number]) as of 02/21/2016 15:15  Ref. Range 02/20/2016 12:09 02/20/2016 16:34 02/20/2016 21:30 02/21/2016 08:45 02/21/2016 13:19  Glucose-Capillary Latest Ref Range: 65-99 mg/dL 336 (H) 297 (H) 254 (H) 207 (H) 261 (H)   Diabetes history: Type 2 diabetes Outpatient Diabetes medications: Novolog resistant tid with meals, Lantus 50 units bid Current orders for Inpatient glycemic control:  Lantus 50 units bid, Novolog resistant tid with meals and HS  Inpatient Diabetes Program Recommendations:   Spoke with patient regarding elevated A1C.  Pt. states she is from [place], [place] and was here visiting her mother.  She has a PCP in [place] who assists in management of her diabetes. She states that she takes Lantus and Novolin R at home.  She endorses that she takes medications regularly.  Discussed results of A1C and she states that this is better then it was.  We discussed goal A1C of 7% and normal blood sugar levels. Patient believes that her A1C is high due to the continued infections she has had.  May need to add Novolog meal coverage 6 units tid with meals-hold if patient eats less than 50%. Will follow.   Thanks, Adah Perl, RN, BC-ADM Inpatient Diabetes Coordinator Pager [phone] (8a-5p)

## 2016-02-21 NOTE — Progress Notes (Signed)
[place] TEAM 1 - Stepdown/ICU TEAM  Barbara Wang  [id number] DOB: Jul 25, 1966 DOA: 02/19/2016 PCP: Default, Provider, MD    Brief Narrative:  50 y.o. female with a history of hypertension, diabetes on insulin, peripheral neuropathy, obesity, recent admission for idiopathic pancreatitis, and a recent admission (02/05/2016) for healthcare acquired pneumonia. She was discharged with oral Flagyl and levofloxacin. Per patient she never felt quite the same after her d/c, with ongoing dysuria. She presented to the ED feeling worse with neck pain, headache, fevers, n/v as well as chills and rigors at home.   In the ED she was tachycardic in the 140s, febrile to 102.2, and hypotensive at 84/66. The sepsis protocol was initiated, and the pt received vancomycin, 30mg  /kg NS bolus, cefepime, and fluconazole.  Subjective: The patient is feeling much better.  Her neck remains somewhat sore but is nowhere near as stiff as it was even just yesterday.  She denies shortness of breath nausea or vomiting.  She did have very transient musculoskeletal type chest pain this morning but she states this typically happens when her blood pressure gets higher than [id number] systolic.  Assessment & Plan:  Headache - Nuchal rigidity - ?Aseptic meningitis  LP was obtained with results not consistent with bacterial meningitis (cell count unremarkable - protein elevated - glucose elevated - cx remains negative) - HIV negative 02/05/16 - CSF has been sent for for HSV PCR but I suspect the likelihood that this is quite low, especially given her clinical improvement, and therefore we will withhold acyclovir unless her symptoms worsen again - stop abx coverage after 3 days of tx   Sepsis due to UTI  ?fungal UTI (>100k yeast 02/05/16 urine cx) - repeat urine culture pending - cont empiric diflucan for now   Hypokalemia  Replaced - follow  Normocytic anemia  likely due to smoldering illness - Fe studies c/w this - give  one time B12 supplement for borderline level of 279  DM - uncontrolled, insulin dependent CBG remains poorly controlled - adjust tx again today and follow   Obesity - Body mass index is 37.18 kg/(m^2).  HLD Resume home medical tx today   HTN BP has stabilized/is climbing - resume home meds and follow   DVT prophylaxis: lovenox  Code Status: FULL CODE Family Communication: no family present at time of exam  Disposition Plan: transfer to medical bed - ambulate   Consultants:  Neurology (for LP only)  Procedures: 6/24 LP  Antimicrobials:  Cefepime 6/24 > 6/26 Diflucan 6/24> Vanc 6/24 > 6/26 Rocephin 6/24  Objective: Blood pressure 150/77, pulse 93, temperature 98.5 F (36.9 C), temperature source Oral, resp. rate 19, height 5\' 4"  (1.626 m), weight 98.3 kg (216 lb 11.4 oz), SpO2 96 %.  Intake/Output Summary (Last 24 hours) at 02/21/16 1031 Last data filed at 02/21/16 1027  Gross per 24 hour  Intake    503 ml  Output    500 ml  Net      3 ml   Filed Weights   02/19/16 1844  Weight: 98.3 kg (216 lb 11.4 oz)    Examination: General: No acute respiratory distress - significantly improved range of motion in the neck  Lungs: Clear to auscultation bilaterally without wheezes  Cardiovascular: Regular rate and rhythm without murmur gallop or rub Abdomen: Nontender, overweight, soft, bowel sounds positive, no rebound, no ascites, no appreciable mass Extremities: No significant cyanosis, clubbing, edema bilateral lower extremities  CBC:  Recent Labs Lab 02/19/16 1150 02/20/16  0304 02/21/16 0240  WBC 10.5 8.7 7.5  NEUTROABS 7.4  --   --   HGB 14.8 11.7* 10.8*  HCT 42.6 35.9* 34.1*  MCV 86.6 88.4 86.5  PLT 312 338 [id number]   Basic Metabolic Panel:  Recent Labs Lab 02/19/16 1150 02/20/16 0304 02/21/16 0240  NA 134* 137 136  K 3.3* 3.3* 3.7  CL 100* 105 106  CO2 24 26 24   GLUCOSE 284* 406* 246*  BUN 13 14 13   CREATININE 0.95 0.93 0.83  CALCIUM 8.8* 8.1* 8.8*     GFR: Estimated Creatinine Clearance: 90.2 mL/min (by C-G formula based on Cr of 0.83).  Liver Function Tests:  Recent Labs Lab 02/19/16 1150 02/20/16 0304 02/21/16 0240  AST 35 56* 38  ALT 27 37 36  ALKPHOS 89 78 74  BILITOT 0.7 0.4 0.5  PROT 6.6 5.9* 6.1*  ALBUMIN 2.8* 2.4* 2.6*    Coagulation Profile:  Recent Labs Lab 02/19/16 1801  INR 1.33    Cardiac Enzymes: No results for input(s): CKTOTAL, CKMB, CKMBINDEX, TROPONINI in the last 168 hours.  HbA1C: HGB A1C MFR BLD  Date/Time Value Ref Range Status  02/05/2016 07:30 AM 12.7* 4.8 - 5.6 % Final    Comment:    (NOTE)         Pre-diabetes: 5.7 - 6.4         Diabetes: >6.4         Glycemic control for adults with diabetes: <7.0   01/21/2016 10:45 AM 12.2* 4.8 - 5.6 % Final    Comment:    (NOTE)         Pre-diabetes: 5.7 - 6.4         Diabetes: >6.4         Glycemic control for adults with diabetes: <7.0     CBG:  Recent Labs Lab 02/20/16 0758 02/20/16 1209 02/20/16 1634 02/20/16 2130 02/21/16 0845  GLUCAP 390* 336* 297* 254* 207*    Recent Results (from the past 240 hour(s))  Blood Culture (routine x 2)     Status: None (Preliminary result)   Collection Time: 02/19/16 11:30 AM  Result Value Ref Range Status   Specimen Description BLOOD LEFT FOREARM  Final   Special Requests BOTTLES DRAWN AEROBIC AND ANAEROBIC  10CC  Final   Culture NO GROWTH 1 DAY  Final   Report Status PENDING  Incomplete  Blood Culture (routine x 2)     Status: None (Preliminary result)   Collection Time: 02/19/16 11:35 AM  Result Value Ref Range Status   Specimen Description BLOOD LEFT HAND  Final   Special Requests BOTTLES DRAWN AEROBIC AND ANAEROBIC  5CC  Final   Culture NO GROWTH 1 DAY  Final   Report Status PENDING  Incomplete  CSF culture with Stat gram stain     Status: None (Preliminary result)   Collection Time: 02/19/16  4:35 PM  Result Value Ref Range Status   Specimen Description CSF  Final   Special  Requests Normal  Final   Gram Stain   Final    WBC PRESENT, PREDOMINANTLY MONONUCLEAR NO ORGANISMS SEEN CYTOSPIN SMEAR    Culture NO GROWTH 2 DAYS  Final   Report Status PENDING  Incomplete  Culture, blood (x 2)     Status: None (Preliminary result)   Collection Time: 02/19/16  5:56 PM  Result Value Ref Range Status   Specimen Description BLOOD RIGHT ARM  Final   Special Requests IN PEDIATRIC BOTTLE 4CC  Final   Culture NO GROWTH < 24 HOURS  Final   Report Status PENDING  Incomplete  Culture, blood (x 2)     Status: None (Preliminary result)   Collection Time: 02/19/16  6:01 PM  Result Value Ref Range Status   Specimen Description BLOOD RIGHT FOREARM  Final   Special Requests BOTTLES DRAWN AEROBIC AND ANAEROBIC 5CC  Final   Culture NO GROWTH < 24 HOURS  Final   Report Status PENDING  Incomplete  MRSA PCR Screening     Status: None   Collection Time: 02/19/16  6:46 PM  Result Value Ref Range Status   MRSA by PCR NEGATIVE NEGATIVE Final    Comment:        The GeneXpert MRSA Assay (FDA approved for NASAL specimens only), is one component of a comprehensive MRSA colonization surveillance program. It is not intended to diagnose MRSA infection nor to guide or monitor treatment for MRSA infections.      Scheduled Meds: . ceFEPime (MAXIPIME) IV  2 g Intravenous Q8H  . enoxaparin (LOVENOX) injection  40 mg Subcutaneous Q24H  . fluconazole (DIFLUCAN) IV  400 mg Intravenous Q24H  . insulin aspart  0-20 Units Subcutaneous TID WC  . insulin aspart  0-5 Units Subcutaneous QHS  . insulin glargine  40 Units Subcutaneous BID  . pantoprazole  40 mg Oral Daily  . sodium chloride flush  3 mL Intravenous Q12H  . vancomycin  1,000 mg Intravenous Q8H     LOS: 2 days   Time spent: 35 minutes   Cherene Altes, MD [facility] Office  [phone] Pager - Text Page per Shea Evans as per below:  On-Call/Text Page:      [email or web address]      password [email or web address]  If 7PM-7AM, please contact  night-coverage [email or web address] Password [facility] 02/21/2016, 10:31 AM

## 2016-02-21 NOTE — Plan of Care (Signed)
Problem: Pain Managment: Goal: General experience of comfort will improve Outcome: Progressing Pt continue to have severe neck pain, but medication allows some relief.

## 2016-02-21 NOTE — Progress Notes (Signed)
Transferred patient to 6N05 per wheelchair.  Patient is alert and oriented.

## 2016-02-21 NOTE — Progress Notes (Signed)
Pt complaining of chest pain and headache.  Chest pain is 7/10, feels like tightness.  Pt does not have her daily lisinopril and HCTZ scheduled?  They are both on her home med list.  She feels this is why her chest and head are hurting.  EKG was NSR.  400mg  of Ibuprofen was given at 0645.  RN will continue to monitor.

## 2016-02-22 LAB — GLUCOSE, CAPILLARY
Glucose-Capillary: 133 mg/dL — ABNORMAL HIGH (ref 65–99)
Glucose-Capillary: 145 mg/dL — ABNORMAL HIGH (ref 65–99)
Glucose-Capillary: 232 mg/dL — ABNORMAL HIGH (ref 65–99)
Glucose-Capillary: 113 mg/dL — ABNORMAL HIGH (ref 65–99)

## 2016-02-22 MED ORDER — CYANOCOBALAMIN 1000 MCG/ML IJ SOLN
1000.0000 ug | Freq: Once | INTRAMUSCULAR | Status: AC
  Administered 2016-02-22: 1000 ug via INTRAMUSCULAR
  Filled 2016-02-22: qty 1

## 2016-02-22 NOTE — Progress Notes (Signed)
Barbara Wang  [id number] DOB: 12/21/1965 DOA: 02/19/2016 PCP: Default, Provider, MD    Brief Narrative:  50 y.o. female with a history of hypertension, diabetes on insulin, peripheral neuropathy, obesity, recent admission for idiopathic pancreatitis, and a recent admission (02/05/2016) for healthcare acquired pneumonia. She was discharged with oral Flagyl and levofloxacin. Per patient she never felt quite the same after her d/c, with ongoing dysuria. She presented to the ED feeling worse with neck pain, headache, fevers, n/v as well as chills and rigors at home.   In the ED she was tachycardic in the 140s, febrile to 102.2, and hypotensive at 84/66. The sepsis protocol was initiated, and the pt received vancomycin, 30mg  /kg NS bolus, cefepime, and fluconazole.  Subjective: The patient is feeling much better.  Her neck remains somewhat sore but is improving. She denies shortness of breath nausea or vomiting.   Assessment & Plan:  Headache - Nuchal rigidity - ?Aseptic meningitis  LP was obtained with results not consistent with bacterial meningitis (cell count unremarkable - protein elevated - glucose elevated - cx remains negative) - HIV negative 02/05/16 - CSF has been sent for for HSV PCR is negative.  Sepsis due to UTI  - Patient presents with fever, tachycardia, hypotension, perform negative blood culture, LP is nonrevealing, only workup significant for positive urine analysis, she was empirically on IV Diflucan for presumed fungal UTI. - As was discussed with ID Dr Linus Salmons, current urine culture is negative, and usually fungal UTI does not cause sepsis as discussed with him, and no indication for treatment, so plan is to observe off antibiotic over next 24 hours, if remains afebrile and continues to improve and be discharged home tomorrow.  Hypokalemia  Replaced - follow  Normocytic anemia  likely due to smoldering illness - Fe studies c/w this - given  B12 supplement for  borderline level of 279, discharged on by mouth B-12  DM - uncontrolled, insulin dependent CBG is acceptable today, continue with current regimen on Lantus and sliding scale  Obesity - Body mass index is 37.18 kg/(m^2).  HLD Resume home medical tx    HTN Acceptable on home meds  DVT prophylaxis: lovenox  Code Status: FULL CODE Family Communication: no family present at time of exam  Disposition Plan: Home when stable   Consultants:  Neurology (for LP only)  Procedures: 6/24 LP  Antimicrobials:  Cefepime 6/24 > 6/26 Diflucan 6/24>6-26 Vanc 6/24 > 6/26 Rocephin 6/24  Objective: Blood pressure 150/73, pulse 90, temperature 98.3 F (36.8 C), temperature source Oral, resp. rate 14, height 5\' 4"  (1.626 m), weight 98.3 kg (216 lb 11.4 oz), SpO2 98 %.  Intake/Output Summary (Last 24 hours) at 02/22/16 1116 Last data filed at 02/22/16 0918  Gross per 24 hour  Intake    940 ml  Output   1350 ml  Net   -410 ml   Filed Weights   02/19/16 1844  Weight: 98.3 kg (216 lb 11.4 oz)    Examination: General: No acute respiratory distress - significantly improved range of motion in the neck  Lungs: Clear to auscultation bilaterally without wheezes  Cardiovascular: Regular rate and rhythm without murmur gallop or rub Abdomen: Nontender, overweight, soft, bowel sounds positive, no rebound, no ascites, no appreciable mass Extremities: No significant cyanosis, clubbing, edema bilateral lower extremities  CBC:  Recent Labs Lab 02/19/16 1150 02/20/16 0304 02/21/16 0240  WBC 10.5 8.7 7.5  NEUTROABS 7.4  --   --   HGB 14.8  11.7* 10.8*  HCT 42.6 35.9* 34.1*  MCV 86.6 88.4 86.5  PLT 312 338 [id number]   Basic Metabolic Panel:  Recent Labs Lab 02/19/16 1150 02/20/16 0304 02/21/16 0240  NA 134* 137 136  K 3.3* 3.3* 3.7  CL 100* 105 106  CO2 24 26 24   GLUCOSE 284* 406* 246*  BUN 13 14 13   CREATININE 0.95 0.93 0.83  CALCIUM 8.8* 8.1* 8.8*   GFR: Estimated Creatinine  Clearance: 90.2 mL/min (by C-G formula based on Cr of 0.83).  Liver Function Tests:  Recent Labs Lab 02/19/16 1150 02/20/16 0304 02/21/16 0240  AST 35 56* 38  ALT 27 37 36  ALKPHOS 89 78 74  BILITOT 0.7 0.4 0.5  PROT 6.6 5.9* 6.1*  ALBUMIN 2.8* 2.4* 2.6*    Coagulation Profile:  Recent Labs Lab 02/19/16 1801  INR 1.33    Cardiac Enzymes: No results for input(s): CKTOTAL, CKMB, CKMBINDEX, TROPONINI in the last 168 hours.  HbA1C: HGB A1C MFR BLD  Date/Time Value Ref Range Status  02/05/2016 07:30 AM 12.7* 4.8 - 5.6 % Final    Comment:    (NOTE)         Pre-diabetes: 5.7 - 6.4         Diabetes: >6.4         Glycemic control for adults with diabetes: <7.0   01/21/2016 10:45 AM 12.2* 4.8 - 5.6 % Final    Comment:    (NOTE)         Pre-diabetes: 5.7 - 6.4         Diabetes: >6.4         Glycemic control for adults with diabetes: <7.0     CBG:  Recent Labs Lab 02/21/16 0845 02/21/16 1319 02/21/16 1723 02/21/16 2121 02/22/16 0722  GLUCAP 207* 261* 155* 129* 133*    Recent Results (from the past 240 hour(s))  Blood Culture (routine x 2)     Status: None (Preliminary result)   Collection Time: 02/19/16 11:30 AM  Result Value Ref Range Status   Specimen Description BLOOD LEFT FOREARM  Final   Special Requests BOTTLES DRAWN AEROBIC AND ANAEROBIC  10CC  Final   Culture NO GROWTH 2 DAYS  Final   Report Status PENDING  Incomplete  Blood Culture (routine x 2)     Status: None (Preliminary result)   Collection Time: 02/19/16 11:35 AM  Result Value Ref Range Status   Specimen Description BLOOD LEFT HAND  Final   Special Requests BOTTLES DRAWN AEROBIC AND ANAEROBIC  5CC  Final   Culture NO GROWTH 2 DAYS  Final   Report Status PENDING  Incomplete  CSF culture with Stat gram stain     Status: None (Preliminary result)   Collection Time: 02/19/16  4:35 PM  Result Value Ref Range Status   Specimen Description CSF  Final   Special Requests Normal  Final   Gram  Stain   Final    WBC PRESENT, PREDOMINANTLY MONONUCLEAR NO ORGANISMS SEEN CYTOSPIN SMEAR    Culture NO GROWTH 2 DAYS  Final   Report Status PENDING  Incomplete  Culture, blood (x 2)     Status: None (Preliminary result)   Collection Time: 02/19/16  5:56 PM  Result Value Ref Range Status   Specimen Description BLOOD RIGHT ARM  Final   Special Requests IN PEDIATRIC BOTTLE 4CC  Final   Culture NO GROWTH 2 DAYS  Final   Report Status PENDING  Incomplete  Culture, blood (x  2)     Status: None (Preliminary result)   Collection Time: 02/19/16  6:01 PM  Result Value Ref Range Status   Specimen Description BLOOD RIGHT FOREARM  Final   Special Requests BOTTLES DRAWN AEROBIC AND ANAEROBIC 5CC  Final   Culture NO GROWTH 2 DAYS  Final   Report Status PENDING  Incomplete  MRSA PCR Screening     Status: None   Collection Time: 02/19/16  6:46 PM  Result Value Ref Range Status   MRSA by PCR NEGATIVE NEGATIVE Final    Comment:        The GeneXpert MRSA Assay (FDA approved for NASAL specimens only), is one component of a comprehensive MRSA colonization surveillance program. It is not intended to diagnose MRSA infection nor to guide or monitor treatment for MRSA infections.   Culture, Urine     Status: None   Collection Time: 02/20/16  4:30 PM  Result Value Ref Range Status   Specimen Description URINE, CLEAN CATCH  Final   Special Requests NONE  Final   Culture NO GROWTH  Final   Report Status 02/21/2016 FINAL  Final     Scheduled Meds: . atorvastatin  40 mg Oral q1800  . enoxaparin (LOVENOX) injection  40 mg Subcutaneous Q24H  . lisinopril  10 mg Oral Daily   And  . hydrochlorothiazide  12.5 mg Oral Daily  . insulin aspart  0-20 Units Subcutaneous TID WC  . insulin aspart  0-5 Units Subcutaneous QHS  . insulin glargine  50 Units Subcutaneous BID  . pantoprazole  40 mg Oral Daily  . sodium chloride flush  3 mL Intravenous Q12H     LOS: 3 days   Time spent: 25 minutes    Phillips Climes , MD Pager [phone] [facility] Office  [phone] Pager - Text Page per Amion as per below:  On-Call/Text Page:      [email or web address]      password [email or web address]  If 7PM-7AM, please contact night-coverage [email or web address] Password [facility] 02/22/2016, 11:16 AM

## 2016-02-22 NOTE — Care Management Note (Signed)
Case Management Note  Patient Details  Name: Barbara Wang MRN: [id number] Date of Birth: 01/22/1966  Subjective/Objective:                    Action/Plan: Spoke to patient regarding discharge. Patient is NOT  Eligible for [facility] letter , she has already used [facility] this month. Patient plans on going to [place] to fill prescriptions at discharge ( used this pharmacy the last time she was discharge).  Patient is here visiting her mother and is not sure when she will be returning to [place]. Offered to schedule a follow up appointment at  [facility] , patient wanted appointment made. Same done for March 30, 2016 at 230 pm   Expected Discharge Date:                  Expected Discharge Plan:  Home/Self Care  In-House Referral:     Discharge planning Services  CM Consult, [facility]  Post Acute Care Choice:    Choice offered to:     DME Arranged:    DME Agency:     HH Arranged:    [place] Agency:     Status of Service:  Completed, signed off  If discussed at [employer], dates discussed:    Additional Comments:  Marilu Favre, RN 02/22/2016, 1:36 PM

## 2016-02-23 LAB — BASIC METABOLIC PANEL
Anion gap: 12 (ref 5–15)
BUN: 10 mg/dL (ref 6–20)
CO2: 25 mmol/L (ref 22–32)
Calcium: 8.9 mg/dL (ref 8.9–10.3)
Chloride: 103 mmol/L (ref 101–111)
Creatinine, Ser: 0.97 mg/dL (ref 0.44–1.00)
GFR calc Af Amer: >60 mL/min (ref >60)
GFR calc non Af Amer: >60 mL/min (ref >60)
Glucose, Bld: 125 mg/dL — ABNORMAL HIGH (ref 65–99)
Potassium: 3.3 mmol/L — ABNORMAL LOW (ref 3.5–5.1)
Sodium: 140 mmol/L (ref 135–145)

## 2016-02-23 LAB — CBC
HCT: 33.9 % — ABNORMAL LOW (ref 36.0–46.0)
Hemoglobin: 10.8 g/dL — ABNORMAL LOW (ref 12.0–15.0)
MCH: 27.5 pg (ref 26.0–34.0)
MCHC: 31.9 g/dL (ref 30.0–36.0)
MCV: 86.3 fL (ref 78.0–100.0)
Platelets: 309 10*3/uL (ref 150–400)
RBC: 3.93 MIL/uL (ref 3.87–5.11)
RDW: 12.9 % (ref 11.5–15.5)
WBC: 7.4 10*3/uL (ref 4.0–10.5)

## 2016-02-23 LAB — CSF CULTURE: Culture: NO GROWTH

## 2016-02-23 LAB — GLUCOSE, CAPILLARY
Glucose-Capillary: 87 mg/dL (ref 65–99)
Glucose-Capillary: 226 mg/dL — ABNORMAL HIGH (ref 65–99)

## 2016-02-23 LAB — CSF CULTURE W GRAM STAIN: Special Requests: NORMAL

## 2016-02-23 MED ORDER — LEVOFLOXACIN 750 MG PO TABS: 750.0000 mg | ORAL_TABLET | Freq: Every day | ORAL | Status: DC

## 2016-02-23 MED ORDER — IBUPROFEN 400 MG PO TABS: 400.0000 mg | ORAL_TABLET | Freq: Four times a day (QID) | ORAL | Status: DC | PRN

## 2016-02-23 NOTE — Progress Notes (Signed)
AVS given to patient. IV removed. Understanding verbalized. Belongings packed. Transportation arranged by patient.

## 2016-02-23 NOTE — Discharge Summary (Signed)
Physician Discharge Summary  SMAYA ZYLKA [id number] DOB: 1966/05/23 DOA: 02/19/2016  PCP: Default, Provider, MD  Admit date: 02/19/2016 Discharge date: 02/23/2016  Time spent: > 30 minutes  Recommendations for Outpatient Follow-up:  1. Follow up with primary MD in [place] area in 1 week   Discharge Diagnoses:  Principal Problem:   Sepsis ([place]) Active Problems:   Diabetes mellitus ([place])   Obesity   UTI (lower urinary tract infection)  Discharge Condition: stable  Diet recommendation: diabetic  Filed Weights   02/19/16 1844  Weight: 98.3 kg (216 lb 11.4 oz)    History of present illness:  See H&P, Labs, Consult and Test reports for all details in brief, patient is a 50 y.o. female with a history of hypertension, diabetes on insulin, peripheral neuropathy, obesity, recent admission for idiopathic pancreatitis, and a recent admission (02/05/2016) for healthcare acquired pneumonia. She was discharged with oral Flagyl and levofloxacin. Per patient she never felt quite the same after her d/c, with ongoing dysuria. She presented to the ED feeling worse with neck pain, headache, fevers, n/v as well as chills and rigors at home.   In the ED she was tachycardic in the 140s, febrile to 102.2, and hypotensive at 84/66. The sepsis protocol was initiated, and the pt received vancomycin, 30mg  /kg NS bolus, cefepime, and fluconazole.  Hospital Course:  Headache - Nuchal rigidity - ?Aseptic meningitis  LP was obtained with results not consistent with bacterial meningitis (cell count unremarkable - protein elevated - glucose elevated - cx remains negative) - HIV negative 02/05/16 - CSF has been sent for for HSV PCR is negative. Sepsis due to UTI  - Patient presents with fever, tachycardia, hypotension, perform negative blood culture, LP is nonrevealing, only workup significant for positive urine analysis - cultures are negative however has been on and off on antibiotics, favor  treating on d/c given significant septic picture on admission DM - uncontrolled, insulin dependent Obesity - Body mass index is 37.18 kg/(m^2). HLD - Resume home medical tx  HTN - Acceptable on home meds  Procedures:  None    Consultations:  None   Discharge Exam: Filed Vitals:   02/22/16 1012 02/22/16 1403 02/22/16 2143 02/23/16 0614  BP: 150/73 167/83 145/71 140/70  Pulse: 90 81 84 66  Temp:  98.5 F (36.9 C) 98.1 F (36.7 C) 98.2 F (36.8 C)  TempSrc:  Oral Oral Oral  Resp:  16 16 16   Height:      Weight:      SpO2: 98% 100% 97% 100%    General: NAD Cardiovascular: RRR Respiratory: CTA biL  Discharge Instructions Activity:  As tolerated   Get Medicines reviewed and adjusted: Please take all your medications with you for your next visit with your Primary MD  Please request your Primary MD to go over all hospital tests and procedure/radiological results at the follow up, please ask your Primary MD to get all Hospital records sent to his/her office.  If you experience worsening of your admission symptoms, develop shortness of breath, life threatening emergency, suicidal or homicidal thoughts you must seek medical attention immediately by calling 911 or calling your MD immediately if symptoms less severe.  You must read complete instructions/literature along with all the possible adverse reactions/side effects for all the Medicines you take and that have been prescribed to you. Take any new Medicines after you have completely understood and accpet all the possible adverse reactions/side effects.   Do not drive when taking Pain medications.  Do not take more than prescribed Pain, Sleep and Anxiety Medications  Special Instructions: If you have smoked or chewed Tobacco in the last 2 yrs please stop smoking, stop any regular Alcohol and or any Recreational drug use.  Wear Seat belts while driving.  Please note  You were cared for by a hospitalist during your  hospital stay. Once you are discharged, your primary care physician will handle any further medical issues. Please note that NO REFILLS for any discharge medications will be authorized once you are discharged, as it is imperative that you return to your primary care physician (or establish a relationship with a primary care physician if you do not have one) for your aftercare needs so that they can reassess your need for medications and monitor your lab values.    Medication List    STOP taking these medications        metroNIDAZOLE 500 MG tablet  Commonly known as:  FLAGYL      TAKE these medications        acetaminophen 650 MG CR tablet  Commonly known as:  TYLENOL  Take 1,300 mg by mouth every 4 (four) hours.     atorvastatin 40 MG tablet  Commonly known as:  LIPITOR  Take 40 mg by mouth daily at 6 PM.     ibuprofen 400 MG tablet  Commonly known as:  ADVIL,MOTRIN  Take 1 tablet (400 mg total) by mouth every 6 (six) hours as needed for moderate pain.     insulin aspart 100 UNIT/ML FlexPen  Commonly known as:  NOVOLOG  CBG 70 - 120: 0 units CBG 121 - 150: 3 units CBG 151 - 200: 4 units CBG 201 - 250: 7 units CBG 251 - 300: 11 units CBG 301 - 350: 15 units CBG 351 - 400: 20 units     Insulin Glargine 100 UNIT/ML Solostar Pen  Commonly known as:  LANTUS  Inject 50 Units into the skin 2 (two) times daily.     levofloxacin 750 MG tablet  Commonly known as:  LEVAQUIN  Take 1 tablet (750 mg total) by mouth daily.     lisinopril-hydrochlorothiazide 10-12.5 MG tablet  Commonly known as:  PRINZIDE,ZESTORETIC  Take 1 tablet by mouth daily.     oxyCODONE 5 MG immediate release tablet  Commonly known as:  Oxy IR/ROXICODONE  Take 1 tablet (5 mg total) by mouth every 12 (twelve) hours as needed for severe pain.     pantoprazole 40 MG tablet  Commonly known as:  PROTONIX  Take 1 tablet (40 mg total) by mouth daily.           Follow-up Information    Go to [facility].   Why:  March 30, 2016 at 230 pm    Contact information:   [place] [phone]       The results of significant diagnostics from this hospitalization (including imaging, microbiology, ancillary and laboratory) are listed below for reference.    Significant Diagnostic Studies: Dg Chest 2 View  02/07/2016  CLINICAL DATA:  Pneumonia.  Chest pain.  Weakness. EXAM: CHEST  2 VIEW COMPARISON:  CT chest 02/05/2016.  Chest x-ray 07/13/2014. FINDINGS: The lungs are clear wiithout focal pneumonia, edema, pneumothorax or pleural effusion. Interstitial markings are diffusely coarsened with chronic features. The cardio pericardial silhouette is enlarged. The visualized bony structures of the thorax are intact. Telemetry leads overlie the chest. IMPRESSION: No dense focal  airspace consolidation evident by x-ray. Patchy central opacities seen at the lung bases on the previous CT are not evident by x-ray today. Electronically Signed   By: Misty Stanley M.D.   On: 02/07/2016 13:46   Ct Head Wo Contrast  02/19/2016  CLINICAL DATA:  Awoke this morning with posterior head and neck pain, pain with movement, fever, weakness, generalized body aches, history hypertension, type II diabetes mellitus, smoker EXAM: CT HEAD WITHOUT CONTRAST CT CERVICAL SPINE WITHOUT CONTRAST TECHNIQUE: Multidetector CT imaging of the head and cervical spine was performed following the standard protocol without intravenous contrast. Multiplanar CT image reconstructions of the cervical spine were also generated. COMPARISON:  CT head 12/25/2008 FINDINGS: CT HEAD FINDINGS Normal ventricular morphology. No midline shift or mass effect. Normal appearance of brain parenchyma. No intracranial hemorrhage, mass lesion, evidence acute infarction, or extra-axial fluid collections. Mucosal retention cyst at floor of RIGHT maxillary sinus. Bones and sinuses otherwise unremarkable. CT CERVICAL SPINE FINDINGS  Prevertebral soft tissues normal thickness. Disc space narrowing with endplate spur formation C4-C5 through C6-C7. Vertebral body heights maintained without fracture or subluxation. Visualized skullbase intact. Lung apices clear. IMPRESSION: Normal CT head. Degenerative disc disease changes cervical spine. No acute cervical spine abnormalities. Electronically Signed   By: Lavonia Dana M.D.   On: 02/19/2016 13:21   Ct Angio Chest Pe W/cm &/or Wo Cm  02/05/2016  CLINICAL DATA:  Acute onset of fatigue, generalized weakness and tachycardia. Shortness of breath and chest pain. Initial encounter. EXAM: CT ANGIOGRAPHY CHEST WITH CONTRAST TECHNIQUE: Multidetector CT imaging of the chest was performed using the standard protocol during bolus administration of intravenous contrast. Multiplanar CT image reconstructions and MIPs were obtained to evaluate the vascular anatomy. CONTRAST:  50 mL of Isovue 370 IV contrast COMPARISON:  Chest radiograph performed 07/13/2014 FINDINGS: There is no evidence of pulmonary embolus. Mild patchy central opacities are noted at the lung bases, concerning for mild infection. There is no evidence of pleural effusion or pneumothorax. No masses are identified; no abnormal focal contrast enhancement is seen. The mediastinum is unremarkable in appearance. No mediastinal lymphadenopathy is seen. No pericardial effusion is identified. The great vessels are grossly unremarkable in appearance. No axillary lymphadenopathy is seen. The visualized portions of the thyroid gland are unremarkable in appearance. The visualized portions of the liver and spleen are unremarkable. The patient is status post cholecystectomy, with clips noted at the gallbladder fossa. The visualized portions of the pancreas, adrenal glands and kidneys are within normal limits. No acute osseous abnormalities are seen. Review of the MIP images confirms the above findings. IMPRESSION: 1. No evidence of pulmonary embolus. 2. Mild  patchy central opacities at the lung bases, concerning for mild pneumonia. Electronically Signed   By: Garald Balding M.D.   On: 02/05/2016 05:12   Ct Cervical Spine Wo Contrast  02/19/2016  CLINICAL DATA:  Awoke this morning with posterior head and neck pain, pain with movement, fever, weakness, generalized body aches, history hypertension, type II diabetes mellitus, smoker EXAM: CT HEAD WITHOUT CONTRAST CT CERVICAL SPINE WITHOUT CONTRAST TECHNIQUE: Multidetector CT imaging of the head and cervical spine was performed following the standard protocol without intravenous contrast. Multiplanar CT image reconstructions of the cervical spine were also generated. COMPARISON:  CT head 12/25/2008 FINDINGS: CT HEAD FINDINGS Normal ventricular morphology. No midline shift or mass effect. Normal appearance of brain parenchyma. No intracranial hemorrhage, mass lesion, evidence acute infarction, or extra-axial fluid collections. Mucosal retention cyst at floor of RIGHT maxillary sinus.  Bones and sinuses otherwise unremarkable. CT CERVICAL SPINE FINDINGS Prevertebral soft tissues normal thickness. Disc space narrowing with endplate spur formation C4-C5 through C6-C7. Vertebral body heights maintained without fracture or subluxation. Visualized skullbase intact. Lung apices clear. IMPRESSION: Normal CT head. Degenerative disc disease changes cervical spine. No acute cervical spine abnormalities. Electronically Signed   By: Lavonia Dana M.D.   On: 02/19/2016 13:21   Dg Chest Port 1 View  02/19/2016  CLINICAL DATA:  Fever and headache. EXAM: PORTABLE CHEST 1 VIEW COMPARISON:  02/07/2016 FINDINGS: Both lungs are clear. Heart and mediastinum are within normal limits. The trachea is midline. No acute bone abnormality. Negative for a pneumothorax. IMPRESSION: No active disease. Electronically Signed   By: Markus Daft M.D.   On: 02/19/2016 12:12    Microbiology: Recent Results (from the past 240 hour(s))  Blood Culture (routine  x 2)     Status: None (Preliminary result)   Collection Time: 02/19/16 11:30 AM  Result Value Ref Range Status   Specimen Description BLOOD LEFT FOREARM  Final   Special Requests BOTTLES DRAWN AEROBIC AND ANAEROBIC  10CC  Final   Culture NO GROWTH 4 DAYS  Final   Report Status PENDING  Incomplete  Blood Culture (routine x 2)     Status: None (Preliminary result)   Collection Time: 02/19/16 11:35 AM  Result Value Ref Range Status   Specimen Description BLOOD LEFT HAND  Final   Special Requests BOTTLES DRAWN AEROBIC AND ANAEROBIC  5CC  Final   Culture NO GROWTH 4 DAYS  Final   Report Status PENDING  Incomplete  CSF culture with Stat gram stain     Status: None   Collection Time: 02/19/16  4:35 PM  Result Value Ref Range Status   Specimen Description CSF  Final   Special Requests Normal  Final   Gram Stain   Final    WBC PRESENT, PREDOMINANTLY MONONUCLEAR NO ORGANISMS SEEN CYTOSPIN SMEAR    Culture NO GROWTH 3 DAYS  Final   Report Status 02/23/2016 FINAL  Final  Culture, blood (x 2)     Status: None (Preliminary result)   Collection Time: 02/19/16  5:56 PM  Result Value Ref Range Status   Specimen Description BLOOD RIGHT ARM  Final   Special Requests IN PEDIATRIC BOTTLE 4CC  Final   Culture NO GROWTH 4 DAYS  Final   Report Status PENDING  Incomplete  Culture, blood (x 2)     Status: None (Preliminary result)   Collection Time: 02/19/16  6:01 PM  Result Value Ref Range Status   Specimen Description BLOOD RIGHT FOREARM  Final   Special Requests BOTTLES DRAWN AEROBIC AND ANAEROBIC 5CC  Final   Culture NO GROWTH 4 DAYS  Final   Report Status PENDING  Incomplete  MRSA PCR Screening     Status: None   Collection Time: 02/19/16  6:46 PM  Result Value Ref Range Status   MRSA by PCR NEGATIVE NEGATIVE Final    Comment:        The GeneXpert MRSA Assay (FDA approved for NASAL specimens only), is one component of a comprehensive MRSA colonization surveillance program. It is  not intended to diagnose MRSA infection nor to guide or monitor treatment for MRSA infections.   Culture, Urine     Status: None   Collection Time: 02/20/16  4:30 PM  Result Value Ref Range Status   Specimen Description URINE, CLEAN CATCH  Final   Special Requests NONE  Final  Culture NO GROWTH  Final   Report Status 02/21/2016 FINAL  Final     Labs: Basic Metabolic Panel:  Recent Labs Lab 02/19/16 1150 02/20/16 0304 02/21/16 0240 02/23/16 0553  NA 134* 137 136 140  K 3.3* 3.3* 3.7 3.3*  CL 100* 105 106 103  CO2 24 26 24 25   GLUCOSE 284* 406* 246* 125*  BUN 13 14 13 10   CREATININE 0.95 0.93 0.83 0.97  CALCIUM 8.8* 8.1* 8.8* 8.9   Liver Function Tests:  Recent Labs Lab 02/19/16 1150 02/20/16 0304 02/21/16 0240  AST 35 56* 38  ALT 27 37 36  ALKPHOS 89 78 74  BILITOT 0.7 0.4 0.5  PROT 6.6 5.9* 6.1*  ALBUMIN 2.8* 2.4* 2.6*   CBC:  Recent Labs Lab 02/19/16 1150 02/20/16 0304 02/21/16 0240 02/23/16 0553  WBC 10.5 8.7 7.5 7.4  NEUTROABS 7.4  --   --   --   HGB 14.8 11.7* 10.8* 10.8*  HCT 42.6 35.9* 34.1* 33.9*  MCV 86.6 88.4 86.5 86.3  PLT 312 338 326 309   BNP: BNP (last 3 results)  Recent Labs  02/05/16 0328  BNP 16.8    CBG:  Recent Labs Lab 02/22/16 1130 02/22/16 1703 02/22/16 2142 02/23/16 0803 02/23/16 1220  GLUCAP 145* 232* 113* 87 226*    Signed:  GHERGHE, COSTIN  [facility] 02/23/2016, 4:12 PM

## 2016-02-24 LAB — CULTURE, BLOOD (ROUTINE X 2)
Culture: NO GROWTH
Culture: NO GROWTH
Culture: NO GROWTH
Culture: NO GROWTH

## 2016-03-30 ENCOUNTER — Ambulatory Visit: Payer: Medicaid - Out of State | Admitting: Family Medicine

## 2016-04-06 NOTE — Telephone Encounter (Signed)
Error

## 2016-07-01 ENCOUNTER — Emergency Department (HOSPITAL_COMMUNITY): Payer: Medicaid - Out of State

## 2016-07-01 ENCOUNTER — Encounter (HOSPITAL_COMMUNITY): Payer: Self-pay

## 2016-07-01 ENCOUNTER — Inpatient Hospital Stay (HOSPITAL_COMMUNITY)
Admission: EM | Admit: 2016-07-01 | Discharge: 2016-07-02 | DRG: 638 | Disposition: A | Discharge status: Home / Self Care | Payer: Medicaid - Out of State | Attending: Oncology | Admitting: Oncology

## 2016-07-01 DIAGNOSIS — I10 Essential (primary) hypertension: Secondary | ICD-10-CM | POA: Diagnosis present

## 2016-07-01 DIAGNOSIS — Z881 Allergy status to other antibiotic agents status: Secondary | ICD-10-CM

## 2016-07-01 DIAGNOSIS — Z79891 Long term (current) use of opiate analgesic: Secondary | ICD-10-CM

## 2016-07-01 DIAGNOSIS — E1100 Type 2 diabetes mellitus with hyperosmolarity without nonketotic hyperglycemic-hyperosmolar coma (NKHHC): Secondary | ICD-10-CM | POA: Diagnosis present

## 2016-07-01 DIAGNOSIS — Z833 Family history of diabetes mellitus: Secondary | ICD-10-CM

## 2016-07-01 DIAGNOSIS — M791 Myalgia: Secondary | ICD-10-CM | POA: Diagnosis not present

## 2016-07-01 DIAGNOSIS — B9689 Other specified bacterial agents as the cause of diseases classified elsewhere: Secondary | ICD-10-CM

## 2016-07-01 DIAGNOSIS — Z823 Family history of stroke: Secondary | ICD-10-CM

## 2016-07-01 DIAGNOSIS — Z79899 Other long term (current) drug therapy: Secondary | ICD-10-CM

## 2016-07-01 DIAGNOSIS — E1101 Type 2 diabetes mellitus with hyperosmolarity with coma: Principal | ICD-10-CM | POA: Diagnosis present

## 2016-07-01 DIAGNOSIS — R350 Frequency of micturition: Secondary | ICD-10-CM | POA: Diagnosis present

## 2016-07-01 DIAGNOSIS — Z23 Encounter for immunization: Secondary | ICD-10-CM

## 2016-07-01 DIAGNOSIS — Y92410 Unspecified street and highway as the place of occurrence of the external cause: Secondary | ICD-10-CM

## 2016-07-01 DIAGNOSIS — E785 Hyperlipidemia, unspecified: Secondary | ICD-10-CM | POA: Diagnosis present

## 2016-07-01 DIAGNOSIS — F17210 Nicotine dependence, cigarettes, uncomplicated: Secondary | ICD-10-CM | POA: Diagnosis present

## 2016-07-01 DIAGNOSIS — Z7982 Long term (current) use of aspirin: Secondary | ICD-10-CM

## 2016-07-01 DIAGNOSIS — N390 Urinary tract infection, site not specified: Secondary | ICD-10-CM | POA: Diagnosis present

## 2016-07-01 DIAGNOSIS — Z794 Long term (current) use of insulin: Secondary | ICD-10-CM | POA: Diagnosis not present

## 2016-07-01 DIAGNOSIS — J984 Other disorders of lung: Secondary | ICD-10-CM

## 2016-07-01 DIAGNOSIS — M62838 Other muscle spasm: Secondary | ICD-10-CM | POA: Diagnosis present

## 2016-07-01 DIAGNOSIS — Z7951 Long term (current) use of inhaled steroids: Secondary | ICD-10-CM

## 2016-07-01 DIAGNOSIS — Z882 Allergy status to sulfonamides status: Secondary | ICD-10-CM

## 2016-07-01 DIAGNOSIS — K219 Gastro-esophageal reflux disease without esophagitis: Secondary | ICD-10-CM | POA: Diagnosis not present

## 2016-07-01 DIAGNOSIS — B9620 Unspecified Escherichia coli [E. coli] as the cause of diseases classified elsewhere: Secondary | ICD-10-CM | POA: Diagnosis present

## 2016-07-01 DIAGNOSIS — N179 Acute kidney failure, unspecified: Secondary | ICD-10-CM | POA: Diagnosis present

## 2016-07-01 DIAGNOSIS — R739 Hyperglycemia, unspecified: Secondary | ICD-10-CM

## 2016-07-01 DIAGNOSIS — Z888 Allergy status to other drugs, medicaments and biological substances status: Secondary | ICD-10-CM

## 2016-07-01 DIAGNOSIS — Z880 Allergy status to penicillin: Secondary | ICD-10-CM

## 2016-07-01 DIAGNOSIS — R300 Dysuria: Secondary | ICD-10-CM | POA: Diagnosis present

## 2016-07-01 LAB — COMPREHENSIVE METABOLIC PANEL
ALT: 19 U/L (ref 14–54)
AST: 23 U/L (ref 15–41)
Albumin: 3.7 g/dL (ref 3.5–5.0)
Alkaline Phosphatase: 110 U/L (ref 38–126)
Anion gap: 10 (ref 5–15)
BUN: 15 mg/dL (ref 6–20)
CO2: 26 mmol/L (ref 22–32)
Calcium: 9.4 mg/dL (ref 8.9–10.3)
Chloride: 94 mmol/L — ABNORMAL LOW (ref 101–111)
Creatinine, Ser: 1.06 mg/dL — ABNORMAL HIGH (ref 0.44–1.00)
GFR calc Af Amer: >60 mL/min (ref >60)
GFR calc non Af Amer: 59 mL/min — ABNORMAL LOW (ref >60)
Glucose, Bld: 657 mg/dL (ref 65–99)
Potassium: 4.5 mmol/L (ref 3.5–5.1)
Sodium: 130 mmol/L — ABNORMAL LOW (ref 135–145)
Total Bilirubin: 1.2 mg/dL (ref 0.3–1.2)
Total Protein: 6.9 g/dL (ref 6.5–8.1)

## 2016-07-01 LAB — CBC
HCT: 43 % (ref 36.0–46.0)
Hemoglobin: 14.9 g/dL (ref 12.0–15.0)
MCH: 28.7 pg (ref 26.0–34.0)
MCHC: 34.7 g/dL (ref 30.0–36.0)
MCV: 82.7 fL (ref 78.0–100.0)
Platelets: 290 10*3/uL (ref 150–400)
RBC: 5.2 MIL/uL — ABNORMAL HIGH (ref 3.87–5.11)
RDW: 14.4 % (ref 11.5–15.5)
WBC: 6.2 10*3/uL (ref 4.0–10.5)

## 2016-07-01 LAB — GLUCOSE, CAPILLARY
Glucose-Capillary: 535 mg/dL (ref 65–99)
Glucose-Capillary: 482 mg/dL — ABNORMAL HIGH (ref 65–99)
Glucose-Capillary: 342 mg/dL — ABNORMAL HIGH (ref 65–99)

## 2016-07-01 LAB — BASIC METABOLIC PANEL
Anion gap: 9 (ref 5–15)
BUN: 13 mg/dL (ref 6–20)
CO2: 22 mmol/L (ref 22–32)
Calcium: 8.3 mg/dL — ABNORMAL LOW (ref 8.9–10.3)
Chloride: 101 mmol/L (ref 101–111)
Creatinine, Ser: 1 mg/dL (ref 0.44–1.00)
GFR calc Af Amer: >60 mL/min (ref >60)
GFR calc non Af Amer: >60 mL/min (ref >60)
Glucose, Bld: 522 mg/dL (ref 65–99)
Potassium: 4.1 mmol/L (ref 3.5–5.1)
Sodium: 132 mmol/L — ABNORMAL LOW (ref 135–145)

## 2016-07-01 LAB — URINALYSIS, ROUTINE W REFLEX MICROSCOPIC
Bilirubin Urine: NEGATIVE
Glucose, UA: >1000 mg/dL — AB
Hgb urine dipstick: NEGATIVE
Ketones, ur: NEGATIVE mg/dL
Leukocytes, UA: NEGATIVE
Nitrite: POSITIVE — AB
Protein, ur: NEGATIVE mg/dL
Specific Gravity, Urine: 1.029 (ref 1.005–1.030)
pH: 5 (ref 5.0–8.0)

## 2016-07-01 LAB — I-STAT VENOUS BLOOD GAS, ED
Acid-Base Excess: 1 mmol/L (ref 0.0–2.0)
Bicarbonate: 26.7 mmol/L (ref 20.0–28.0)
O2 Saturation: 63 %
TCO2: 28 mmol/L (ref 0–100)
pCO2, Ven: 45.5 mmHg (ref 44.0–60.0)
pH, Ven: 7.376 (ref 7.250–7.430)
pO2, Ven: 34 mmHg (ref 32.0–45.0)

## 2016-07-01 LAB — URINE MICROSCOPIC-ADD ON

## 2016-07-01 LAB — CBG MONITORING, ED
Glucose-Capillary: >600 mg/dL (ref 65–99)
Glucose-Capillary: 543 mg/dL (ref 65–99)
Glucose-Capillary: 434 mg/dL — ABNORMAL HIGH (ref 65–99)

## 2016-07-01 MED ORDER — ATORVASTATIN CALCIUM 40 MG PO TABS
40.0000 mg | ORAL_TABLET | Freq: Every day | ORAL | Status: DC
  Administered 2016-07-01: 40 mg via ORAL
  Filled 2016-07-01: qty 1

## 2016-07-01 MED ORDER — POTASSIUM CHLORIDE CRYS ER 20 MEQ PO TBCR
40.0000 meq | EXTENDED_RELEASE_TABLET | Freq: Once | ORAL | Status: AC
  Administered 2016-07-01: 40 meq via ORAL
  Filled 2016-07-01: qty 2

## 2016-07-01 MED ORDER — SODIUM CHLORIDE 0.9 % IV BOLUS (SEPSIS)
1000.0000 mL | Freq: Once | INTRAVENOUS | Status: AC
  Administered 2016-07-01: 1000 mL via INTRAVENOUS

## 2016-07-01 MED ORDER — FENTANYL CITRATE (PF) 100 MCG/2ML IJ SOLN
50.0000 ug | Freq: Once | INTRAMUSCULAR | Status: AC
Start: 2016-07-01 — End: 2016-07-01
  Administered 2016-07-01: 50 ug via INTRAVENOUS
  Filled 2016-07-01: qty 2

## 2016-07-01 MED ORDER — ENOXAPARIN SODIUM 40 MG/0.4ML ~~LOC~~ SOLN
40.0000 mg | SUBCUTANEOUS | Status: DC
  Administered 2016-07-01: 40 mg via SUBCUTANEOUS
  Filled 2016-07-01: qty 0.4

## 2016-07-01 MED ORDER — NITROFURANTOIN MONOHYD MACRO 100 MG PO CAPS
100.0000 mg | ORAL_CAPSULE | Freq: Two times a day (BID) | ORAL | Status: DC
  Administered 2016-07-01 – 2016-07-02 (×2): 100 mg via ORAL
  Filled 2016-07-01 (×3): qty 1

## 2016-07-01 MED ORDER — ACETAMINOPHEN 325 MG PO TABS
650.0000 mg | ORAL_TABLET | Freq: Four times a day (QID) | ORAL | Status: DC | PRN
  Administered 2016-07-02: 650 mg via ORAL
  Filled 2016-07-01: qty 2

## 2016-07-01 MED ORDER — NICOTINE 21 MG/24HR TD PT24
21.0000 mg | MEDICATED_PATCH | Freq: Every day | TRANSDERMAL | Status: DC
  Administered 2016-07-02: 21 mg via TRANSDERMAL
  Filled 2016-07-01: qty 1

## 2016-07-01 MED ORDER — FLUTICASONE PROPIONATE HFA 220 MCG/ACT IN AERO: 2.0000 | INHALATION_SPRAY | Freq: Two times a day (BID) | RESPIRATORY_TRACT | Status: DC | PRN

## 2016-07-01 MED ORDER — SODIUM CHLORIDE 0.9 % IV SOLN
INTRAVENOUS | Status: DC
  Administered 2016-07-01: 4.8 [IU]/h via INTRAVENOUS
  Filled 2016-07-01: qty 2.5

## 2016-07-01 MED ORDER — ASPIRIN EC 81 MG PO TBEC
81.0000 mg | DELAYED_RELEASE_TABLET | Freq: Every day | ORAL | Status: DC
  Administered 2016-07-02: 81 mg via ORAL
  Filled 2016-07-01: qty 1

## 2016-07-01 MED ORDER — SODIUM CHLORIDE 0.9 % IV SOLN
INTRAVENOUS | Status: DC
  Administered 2016-07-01: 21:00:00 via INTRAVENOUS

## 2016-07-01 MED ORDER — DEXTROSE-NACL 5-0.45 % IV SOLN
INTRAVENOUS | Status: DC
  Administered 2016-07-02: via INTRAVENOUS

## 2016-07-01 MED ORDER — ACETAMINOPHEN 650 MG RE SUPP: 650.0000 mg | Freq: Four times a day (QID) | RECTAL | Status: DC | PRN

## 2016-07-01 MED ORDER — GUAIFENESIN ER 600 MG PO TB12
600.0000 mg | ORAL_TABLET | Freq: Two times a day (BID) | ORAL | Status: DC | PRN
  Administered 2016-07-01: 600 mg via ORAL
  Filled 2016-07-01: qty 1

## 2016-07-01 MED ORDER — INFLUENZA VAC SPLIT QUAD 0.5 ML IM SUSY
0.5000 mL | PREFILLED_SYRINGE | INTRAMUSCULAR | Status: AC
  Administered 2016-07-02: 0.5 mL via INTRAMUSCULAR
  Filled 2016-07-01: qty 0.5

## 2016-07-01 MED ORDER — ALBUTEROL SULFATE (2.5 MG/3ML) 0.083% IN NEBU: 3.0000 mL | INHALATION_SOLUTION | Freq: Four times a day (QID) | RESPIRATORY_TRACT | Status: DC | PRN

## 2016-07-01 MED ORDER — ONDANSETRON HCL 4 MG/2ML IJ SOLN
4.0000 mg | Freq: Once | INTRAMUSCULAR | Status: AC
  Administered 2016-07-01: 4 mg via INTRAVENOUS
  Filled 2016-07-01: qty 2

## 2016-07-01 MED ORDER — INSULIN ASPART 100 UNIT/ML ~~LOC~~ SOLN
9.0000 [IU] | Freq: Once | SUBCUTANEOUS | Status: AC
  Administered 2016-07-01: 9 [IU] via INTRAVENOUS
  Filled 2016-07-01: qty 1

## 2016-07-01 MED ORDER — PNEUMOCOCCAL VAC POLYVALENT 25 MCG/0.5ML IJ INJ
0.5000 mL | INJECTION | INTRAMUSCULAR | Status: AC
  Administered 2016-07-02: 0.5 mL via INTRAMUSCULAR
  Filled 2016-07-01 (×2): qty 0.5

## 2016-07-01 MED ORDER — NAPROXEN 250 MG PO TABS
500.0000 mg | ORAL_TABLET | Freq: Two times a day (BID) | ORAL | Status: DC
  Administered 2016-07-02 (×2): 500 mg via ORAL
  Filled 2016-07-01 (×4): qty 2

## 2016-07-01 MED ORDER — DEXTROSE 50 % IV SOLN: 25.0000 mL | INTRAVENOUS | Status: DC | PRN

## 2016-07-01 MED ORDER — BUDESONIDE 0.5 MG/2ML IN SUSP: 1.0000 mg | Freq: Two times a day (BID) | RESPIRATORY_TRACT | Status: DC | PRN

## 2016-07-01 MED ORDER — INSULIN REGULAR BOLUS VIA INFUSION
0.0000 [IU] | Freq: Three times a day (TID) | INTRAVENOUS | Status: DC
  Filled 2016-07-01: qty 10

## 2016-07-01 NOTE — Progress Notes (Signed)
New Admission Note:  Arrival Method: Stretcher Mental Orientation: Alert and oriented x 4 Telemetry: N/A Assessment: Completed Skin: Warm and dry IV: NSL Pain: Denies Tubes: N/A Safety Measures: Safety Fall Prevention Plan was given, discussed and signed. Admission: Completed [address] Orientation: Patient has been orientated to the room, unit and the staff. Family: Celesta Gentile  Orders have been reviewed and implemented. Will continue to monitor the patient. Call light has been placed within reach and bed alarm has been activated.   Sima Matas BSN, RN  Phone Number: [phone]

## 2016-07-01 NOTE — ED Notes (Signed)
Pt returned from xray
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
## 2016-07-01 NOTE — ED Notes (Signed)
CBG 543; RN made aware

## 2016-07-01 NOTE — ED Notes (Signed)
CBG resulted at 434. RN notified.

## 2016-07-01 NOTE — ED Notes (Signed)
Checked patient blood sugar it read greater then 600 notified RN Jamire of blood sugar

## 2016-07-01 NOTE — ED Notes (Signed)
CRITICAL VALUE ALERT  Critical value received:  Glucose 657  Date of notification: 07/01/2016  Time of notification: 1748  Critical value read back: yes  Nurse who received alert:  Raynelle Bring RN   MD notified: Dannial Monarch
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
## 2016-07-02 DIAGNOSIS — E785 Hyperlipidemia, unspecified: Secondary | ICD-10-CM | POA: Diagnosis present

## 2016-07-02 DIAGNOSIS — N179 Acute kidney failure, unspecified: Secondary | ICD-10-CM | POA: Diagnosis present

## 2016-07-02 DIAGNOSIS — Y92410 Unspecified street and highway as the place of occurrence of the external cause: Secondary | ICD-10-CM | POA: Diagnosis not present

## 2016-07-02 DIAGNOSIS — N390 Urinary tract infection, site not specified: Secondary | ICD-10-CM

## 2016-07-02 DIAGNOSIS — Z7982 Long term (current) use of aspirin: Secondary | ICD-10-CM | POA: Diagnosis not present

## 2016-07-02 DIAGNOSIS — E1101 Type 2 diabetes mellitus with hyperosmolarity with coma: Secondary | ICD-10-CM | POA: Diagnosis present

## 2016-07-02 DIAGNOSIS — K219 Gastro-esophageal reflux disease without esophagitis: Secondary | ICD-10-CM | POA: Diagnosis present

## 2016-07-02 DIAGNOSIS — F17210 Nicotine dependence, cigarettes, uncomplicated: Secondary | ICD-10-CM | POA: Diagnosis present

## 2016-07-02 DIAGNOSIS — R350 Frequency of micturition: Secondary | ICD-10-CM | POA: Diagnosis present

## 2016-07-02 DIAGNOSIS — Z823 Family history of stroke: Secondary | ICD-10-CM | POA: Diagnosis not present

## 2016-07-02 DIAGNOSIS — R739 Hyperglycemia, unspecified: Secondary | ICD-10-CM | POA: Diagnosis present

## 2016-07-02 DIAGNOSIS — Z882 Allergy status to sulfonamides status: Secondary | ICD-10-CM | POA: Diagnosis not present

## 2016-07-02 DIAGNOSIS — S134XXA Sprain of ligaments of cervical spine, initial encounter: Secondary | ICD-10-CM

## 2016-07-02 DIAGNOSIS — I10 Essential (primary) hypertension: Secondary | ICD-10-CM | POA: Diagnosis present

## 2016-07-02 DIAGNOSIS — E1100 Type 2 diabetes mellitus with hyperosmolarity without nonketotic hyperglycemic-hyperosmolar coma (NKHHC): Secondary | ICD-10-CM | POA: Diagnosis present

## 2016-07-02 DIAGNOSIS — M62838 Other muscle spasm: Secondary | ICD-10-CM | POA: Diagnosis present

## 2016-07-02 DIAGNOSIS — Z79899 Other long term (current) drug therapy: Secondary | ICD-10-CM | POA: Diagnosis not present

## 2016-07-02 DIAGNOSIS — B9620 Unspecified Escherichia coli [E. coli] as the cause of diseases classified elsewhere: Secondary | ICD-10-CM | POA: Diagnosis present

## 2016-07-02 DIAGNOSIS — Z7951 Long term (current) use of inhaled steroids: Secondary | ICD-10-CM | POA: Diagnosis not present

## 2016-07-02 DIAGNOSIS — Z79891 Long term (current) use of opiate analgesic: Secondary | ICD-10-CM | POA: Diagnosis not present

## 2016-07-02 DIAGNOSIS — Z794 Long term (current) use of insulin: Secondary | ICD-10-CM

## 2016-07-02 DIAGNOSIS — Z23 Encounter for immunization: Secondary | ICD-10-CM | POA: Diagnosis not present

## 2016-07-02 DIAGNOSIS — R300 Dysuria: Secondary | ICD-10-CM | POA: Diagnosis present

## 2016-07-02 DIAGNOSIS — Z833 Family history of diabetes mellitus: Secondary | ICD-10-CM | POA: Diagnosis not present

## 2016-07-02 LAB — GLUCOSE, CAPILLARY
Glucose-Capillary: 154 mg/dL — ABNORMAL HIGH (ref 65–99)
Glucose-Capillary: 154 mg/dL — ABNORMAL HIGH (ref 65–99)
Glucose-Capillary: 171 mg/dL — ABNORMAL HIGH (ref 65–99)
Glucose-Capillary: 176 mg/dL — ABNORMAL HIGH (ref 65–99)
Glucose-Capillary: 177 mg/dL — ABNORMAL HIGH (ref 65–99)
Glucose-Capillary: 213 mg/dL — ABNORMAL HIGH (ref 65–99)
Glucose-Capillary: 222 mg/dL — ABNORMAL HIGH (ref 65–99)
Glucose-Capillary: 254 mg/dL — ABNORMAL HIGH (ref 65–99)
Glucose-Capillary: 324 mg/dL — ABNORMAL HIGH (ref 65–99)
Glucose-Capillary: 347 mg/dL — ABNORMAL HIGH (ref 65–99)
Glucose-Capillary: 435 mg/dL — ABNORMAL HIGH (ref 65–99)

## 2016-07-02 LAB — BASIC METABOLIC PANEL WITH GFR
Anion gap: 5 (ref 5–15)
BUN: 13 mg/dL (ref 6–20)
CO2: 25 mmol/L (ref 22–32)
Calcium: 8.5 mg/dL — ABNORMAL LOW (ref 8.9–10.3)
Chloride: 110 mmol/L (ref 101–111)
Creatinine, Ser: 0.91 mg/dL (ref 0.44–1.00)
GFR calc Af Amer: >60 mL/min
GFR calc non Af Amer: >60 mL/min
Glucose, Bld: 135 mg/dL — ABNORMAL HIGH (ref 65–99)
Potassium: 3.7 mmol/L (ref 3.5–5.1)
Sodium: 140 mmol/L (ref 135–145)

## 2016-07-02 MED ORDER — INSULIN ASPART 100 UNIT/ML ~~LOC~~ SOLN: 0.0000 [IU] | Freq: Every day | SUBCUTANEOUS | Status: DC

## 2016-07-02 MED ORDER — INSULIN GLARGINE 100 UNIT/ML SOLOSTAR PEN: 40.0000 [IU] | PEN_INJECTOR | Freq: Two times a day (BID) | SUBCUTANEOUS | 1 refills | Status: DC

## 2016-07-02 MED ORDER — NITROFURANTOIN MONOHYD MACRO 100 MG PO CAPS: 100.0000 mg | ORAL_CAPSULE | Freq: Two times a day (BID) | ORAL | 0 refills | Status: DC

## 2016-07-02 MED ORDER — CYCLOBENZAPRINE HCL 5 MG PO TABS: 5.0000 mg | ORAL_TABLET | Freq: Two times a day (BID) | ORAL | 0 refills | Status: DC | PRN

## 2016-07-02 MED ORDER — CYCLOBENZAPRINE HCL 5 MG PO TABS: 5.0000 mg | ORAL_TABLET | Freq: Two times a day (BID) | ORAL | Status: DC | PRN

## 2016-07-02 MED ORDER — RAMELTEON 8 MG PO TABS
8.0000 mg | ORAL_TABLET | Freq: Every day | ORAL | Status: DC
  Filled 2016-07-02: qty 1

## 2016-07-02 MED ORDER — INSULIN ASPART 100 UNIT/ML ~~LOC~~ SOLN
0.0000 [IU] | Freq: Three times a day (TID) | SUBCUTANEOUS | Status: DC
  Administered 2016-07-02: 8 [IU] via SUBCUTANEOUS
  Administered 2016-07-02: 11 [IU] via SUBCUTANEOUS
  Administered 2016-07-02: 15 [IU] via SUBCUTANEOUS

## 2016-07-02 MED ORDER — INSULIN GLARGINE 100 UNIT/ML ~~LOC~~ SOLN
40.0000 [IU] | Freq: Two times a day (BID) | SUBCUTANEOUS | Status: DC
  Administered 2016-07-02 (×2): 40 [IU] via SUBCUTANEOUS
  Filled 2016-07-02 (×3): qty 0.4

## 2016-07-02 MED ORDER — BACLOFEN 10 MG PO TABS
10.0000 mg | ORAL_TABLET | Freq: Once | ORAL | Status: AC
  Administered 2016-07-02: 10 mg via ORAL
  Filled 2016-07-02: qty 1
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
## 2016-07-02 NOTE — Progress Notes (Signed)
MD notified of patient's CBG result of 435 at 1201; order obtained for 15 units of Novolog. Will continue to monitor.

## 2016-07-02 NOTE — Progress Notes (Signed)
Reviewed discharge instructions and medications with patient; all questions answered. Printed prescription given to patient. Assessment is as charted. Patient leaving unit via wheelchair in stable condition.
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
## 2016-07-02 NOTE — Progress Notes (Signed)
Patient upset why her sugar is trending up.  " I can not sleep. I been hungry all night. If I do not get to eat something I am going home." MD on call on call paged.

## 2016-07-02 NOTE — Progress Notes (Signed)
I went to see Barbara Wang, she is on insulin drip and D51/2 NS she says she is hungry but is willing to wait for next glucose check. She ask for something to help her sleep and says this will keep her mind off of eating. Placed order for melatonin

## 2016-07-04 LAB — URINE CULTURE: Culture: >=100000 — AB

## 2016-09-08 ENCOUNTER — Telehealth: Payer: Self-pay

## 2016-09-08 NOTE — Telephone Encounter (Signed)
Pt. Came into facility requesting a refill on the following medications:  insulin regular (NOVOLIN R,HUMULIN R) 100 units/mL injection  Insulin Glargine (LANTUS) 100 UNIT/ML Solostar Pen  Pt. Has only seen Dr. Leanne Chang and states she is out of insulin. Pt. Was told to call 09/11/16 to schedule an appt.  To establish care. Please f/u

## 2016-09-08 NOTE — Telephone Encounter (Signed)
I am going to forward to Dr. Doreene Burke - patient is not established with [place] and Dr. Leanne Chang only evaluated her for pancreatitis.

## 2016-09-11 ENCOUNTER — Other Ambulatory Visit: Payer: Self-pay | Admitting: Internal Medicine

## 2016-09-11 ENCOUNTER — Other Ambulatory Visit: Payer: Self-pay | Admitting: *Deleted

## 2016-09-11 MED ORDER — INSULIN REGULAR HUMAN 100 UNIT/ML IJ SOLN: 1.0000 [IU] | Freq: Three times a day (TID) | INTRAMUSCULAR | 0 refills | Status: DC

## 2016-09-11 MED ORDER — INSULIN GLARGINE 100 UNIT/ML SOLOSTAR PEN: 40.0000 [IU] | PEN_INJECTOR | Freq: Two times a day (BID) | SUBCUTANEOUS | 1 refills | Status: DC

## 2016-09-11 MED ORDER — INSULIN GLARGINE 100 UNIT/ML SOLOSTAR PEN
40.0000 [IU] | PEN_INJECTOR | Freq: Two times a day (BID) | SUBCUTANEOUS | 1 refills | Status: DC
Start: 2016-09-11 — End: 2016-09-18

## 2016-09-11 MED FILL — !LANTUS 100 UNITS/ML VIAL: 100 | 25 days supply | Qty: 20 | Fill #0

## 2016-09-11 MED FILL — !NOVOLIN R 100 UNITS/ML VIA: 100 | 28 days supply | Qty: 10 | Fill #0

## 2016-09-11 NOTE — Telephone Encounter (Signed)
Refilled at her current dosage regimen, please schedule appointment for patient to establish care with a [facility] provider as soon as possible

## 2016-09-11 NOTE — Telephone Encounter (Signed)
Please schedule patient an appointment to establish with a PCP. Dr. Doreene Burke provided patient with a courtesy refill until appointment can be scheduled with another PCP.

## 2016-09-18 ENCOUNTER — Ambulatory Visit: Payer: Self-pay | Attending: Internal Medicine | Admitting: Internal Medicine

## 2016-09-18 ENCOUNTER — Encounter: Payer: Self-pay | Admitting: Licensed Clinical Social Worker

## 2016-09-18 ENCOUNTER — Encounter: Payer: Self-pay | Admitting: Internal Medicine

## 2016-09-18 ENCOUNTER — Ambulatory Visit: Payer: Self-pay

## 2016-09-18 VITALS — BP 166/88 | HR 79 | Temp 98.5°F | Resp 16 | Wt 178.4 lb

## 2016-09-18 DIAGNOSIS — Z794 Long term (current) use of insulin: Secondary | ICD-10-CM | POA: Insufficient documentation

## 2016-09-18 DIAGNOSIS — E1040 Type 1 diabetes mellitus with diabetic neuropathy, unspecified: Secondary | ICD-10-CM

## 2016-09-18 DIAGNOSIS — N183 Chronic kidney disease, stage 3 (moderate): Secondary | ICD-10-CM | POA: Insufficient documentation

## 2016-09-18 DIAGNOSIS — I129 Hypertensive chronic kidney disease with stage 1 through stage 4 chronic kidney disease, or unspecified chronic kidney disease: Secondary | ICD-10-CM | POA: Insufficient documentation

## 2016-09-18 DIAGNOSIS — E1022 Type 1 diabetes mellitus with diabetic chronic kidney disease: Secondary | ICD-10-CM | POA: Insufficient documentation

## 2016-09-18 DIAGNOSIS — Z880 Allergy status to penicillin: Secondary | ICD-10-CM | POA: Insufficient documentation

## 2016-09-18 DIAGNOSIS — H538 Other visual disturbances: Secondary | ICD-10-CM | POA: Insufficient documentation

## 2016-09-18 DIAGNOSIS — F32A Depression, unspecified: Secondary | ICD-10-CM

## 2016-09-18 DIAGNOSIS — B379 Candidiasis, unspecified: Secondary | ICD-10-CM

## 2016-09-18 DIAGNOSIS — I10 Essential (primary) hypertension: Secondary | ICD-10-CM

## 2016-09-18 DIAGNOSIS — Z79899 Other long term (current) drug therapy: Secondary | ICD-10-CM | POA: Insufficient documentation

## 2016-09-18 DIAGNOSIS — E1065 Type 1 diabetes mellitus with hyperglycemia: Secondary | ICD-10-CM

## 2016-09-18 DIAGNOSIS — F329 Major depressive disorder, single episode, unspecified: Secondary | ICD-10-CM | POA: Insufficient documentation

## 2016-09-18 DIAGNOSIS — Z720 Tobacco use: Secondary | ICD-10-CM

## 2016-09-18 DIAGNOSIS — IMO0002 Reserved for concepts with insufficient information to code with codable children: Secondary | ICD-10-CM

## 2016-09-18 DIAGNOSIS — F17210 Nicotine dependence, cigarettes, uncomplicated: Secondary | ICD-10-CM | POA: Insufficient documentation

## 2016-09-18 LAB — GLUCOSE, POCT (MANUAL RESULT ENTRY)
POC Glucose: 298 mg/dl — AB (ref 70–99)
POC Glucose: 330 mg/dl — AB (ref 70–99)

## 2016-09-18 LAB — BASIC METABOLIC PANEL WITH GFR
BUN: 7 mg/dL (ref 7–25)
CO2: 30 mmol/L (ref 20–31)
Calcium: 9.6 mg/dL (ref 8.6–10.4)
Chloride: 104 mmol/L (ref 98–110)
Creat: 0.86 mg/dL (ref 0.50–1.05)
GFR, Est African American: >89 mL/min (ref >=60)
GFR, Est Non African American: 78 mL/min (ref >=60)
Glucose, Bld: 256 mg/dL — ABNORMAL HIGH (ref 65–99)
Potassium: 3.4 mmol/L — ABNORMAL LOW (ref 3.5–5.3)
Sodium: 143 mmol/L (ref 135–146)

## 2016-09-18 LAB — POCT GLYCOSYLATED HEMOGLOBIN (HGB A1C)

## 2016-09-18 MED ORDER — METFORMIN HCL 1000 MG PO TABS: 1000.0000 mg | ORAL_TABLET | Freq: Two times a day (BID) | ORAL | 3 refills | Status: DC

## 2016-09-18 MED ORDER — FLUCONAZOLE 150 MG PO TABS: 150.0000 mg | ORAL_TABLET | Freq: Every day | ORAL | 0 refills | Status: DC

## 2016-09-18 MED ORDER — NICOTINE 14 MG/24HR TD PT24: 14.0000 mg | MEDICATED_PATCH | Freq: Every day | TRANSDERMAL | 3 refills | Status: DC

## 2016-09-18 MED ORDER — INSULIN GLARGINE 100 UNIT/ML SOLOSTAR PEN: 50.0000 [IU] | PEN_INJECTOR | Freq: Two times a day (BID) | SUBCUTANEOUS | 3 refills | Status: DC

## 2016-09-18 MED ORDER — TRUEPLUS LANCETS 26G MISC: 1.0000 | Freq: Three times a day (TID) | 12 refills | Status: DC | PRN

## 2016-09-18 MED ORDER — TRUE METRIX GO GLUCOSE METER W/DEVICE KIT: 1.0000 | PACK | Freq: Three times a day (TID) | 0 refills | Status: DC | PRN

## 2016-09-18 MED ORDER — ATORVASTATIN CALCIUM 40 MG PO TABS: 40.0000 mg | ORAL_TABLET | Freq: Every day | ORAL | 3 refills | Status: DC

## 2016-09-18 MED ORDER — LISINOPRIL-HYDROCHLOROTHIAZIDE 10-12.5 MG PO TABS: 1.0000 | ORAL_TABLET | Freq: Every day | ORAL | 3 refills | Status: DC

## 2016-09-18 MED ORDER — INSULIN REGULAR HUMAN 100 UNIT/ML IJ SOLN: 10.0000 [IU] | Freq: Three times a day (TID) | INTRAMUSCULAR | 3 refills | Status: DC

## 2016-09-18 MED ORDER — INSULIN ASPART 100 UNIT/ML ~~LOC~~ SOLN: 10.0000 [IU] | Freq: Once | SUBCUTANEOUS | Status: DC

## 2016-09-18 MED ORDER — GABAPENTIN 600 MG PO TABS: 600.0000 mg | ORAL_TABLET | Freq: Three times a day (TID) | ORAL | 3 refills | Status: DC

## 2016-09-18 MED ORDER — ASPIRIN 81 MG PO TBEC: 81.0000 mg | DELAYED_RELEASE_TABLET | Freq: Every day | ORAL | 3 refills | Status: DC

## 2016-09-18 MED ORDER — ESCITALOPRAM OXALATE 20 MG PO TABS: 20.0000 mg | ORAL_TABLET | Freq: Every day | ORAL | 2 refills | Status: DC

## 2016-09-18 MED ORDER — GLUCOSE BLOOD VI STRP: ORAL_STRIP | 12 refills | Status: DC

## 2016-09-18 MED FILL — TRUE METRIX TEST STRIP: 25 days supply | Qty: 100 | Fill #0

## 2016-09-18 MED FILL — $LANTUS SOLOSTAR 100 UNITS/: 100 | 15 days supply | Qty: 15 | Fill #0

## 2016-09-18 MED FILL — TRUEplus LANCETS 28G MISC: 25 days supply | Qty: 100 | Fill #0

## 2016-09-18 MED FILL — FLUCONAZOLE 150 MG TABLET: 150 | 3 days supply | Qty: 3 | Fill #0

## 2016-09-18 MED FILL — TRUE METRIX BLOOD GLUCOSE M: W/DEVICE | 1 days supply | Qty: 1 | Fill #0

## 2016-09-18 MED FILL — NovoLIN R 100 UNIT/ML SOLN: 100 | 33 days supply | Qty: 10 | Fill #0

## 2016-09-18 MED FILL — metFORMIN HCL 1000 MG TABS: 1000 | 30 days supply | Qty: 60 | Fill #0

## 2016-09-18 MED FILL — LISINOPRIL-HCTZ 10-12.5 MG: 10-12.5 | 30 days supply | Qty: 30 | Fill #0

## 2016-09-18 NOTE — Patient Instructions (Addendum)
- financial aid packet/.  - [facility] 2 wks /dm chk. - fu [facility] rn bp   Check blood sugars on waking up daily., and at least 1-2 times rest of day.    Also check blood sugars about 2 hours after a meal and do this after different meals by rotation  Recommended blood sugar levels on waking up is 90-130 and about 2 hours after meal is 130-160  Please bring your blood sugar monitor to each visit, thank you  Restart exercise  Continue cholesterol med.  -- please call us if you have any questions  - -  Hypertension Hypertension is another name for high blood pressure. High blood pressure forces your heart to work harder to pump blood. A blood pressure reading has two numbers, which includes a higher number over a lower number (example: 110/72). Follow these instructions at home:  Have your blood pressure rechecked by your doctor.  Only take medicine as told by your doctor. Follow the directions carefully. The medicine does not work as well if you skip doses. Skipping doses also puts you at risk for problems.  Do not smoke.  Monitor your blood pressure at home as told by your doctor. Contact a doctor if:  You think you are having a reaction to the medicine you are taking.  You have repeat headaches or feel dizzy.  You have puffiness (swelling) in your ankles.  You have trouble with your vision. Get help right away if:  You get a very bad headache and are confused.  You feel weak, numb, or faint.  You get chest or belly (abdominal) pain.  You throw up (vomit).  You cannot breathe very well. This information is not intended to replace advice given to you by your health care provider. Make sure you discuss any questions you have with your health care provider. Document Released: 01/31/2008 Document Revised: 01/20/2016 Document Reviewed: 06/06/2013 Elsevier Interactive Patient Education  2017 [employer] Inc[employer]   -  Low-Sodium Eating Plan Sodium  raises blood pressure and causes water to be held in the body. Getting less sodium from food will help lower your blood pressure, reduce any swelling, and protect your heart, liver, and kidneys. We get sodium by adding salt (sodium chloride) to food. Most of our sodium comes from canned, boxed, and frozen foods. Restaurant foods, fast foods, and pizza are also very high in sodium. Even if you take medicine to lower your blood pressure or to reduce fluid in your body, getting less sodium from your food is important. What is my plan? Most people should limit their sodium intake to 2,300 mg a day. Your health care provider recommends that you limit your sodium intake to 000mg  a day. What do I need to know about this eating plan? For the low-sodium eating plan, you will follow these general guidelines:  Choose foods with a % Daily Value for sodium of less than 5% (as listed on the food label).  Use salt-free seasonings or herbs instead of table salt or sea salt.  Check with your health care provider or pharmacist before using salt substitutes.  Eat fresh foods.  Eat more vegetables and fruits.  Limit canned vegetables. If you do use them, rinse them well to decrease the sodium.  Limit cheese to 1 oz (28 g) per day.  Eat lower-sodium products, often labeled as "lower sodium" or "no salt added."  Avoid foods that contain monosodium glutamate (MSG). MSG is sometimes added to [place]  and some canned foods.  Check food labels (Nutrition Facts labels) on foods to learn how much sodium is in one serving.  Eat more home-cooked food and less restaurant, buffet, and fast food.  When eating at a restaurant, ask that your food be prepared with less salt, or no salt if possible. How do I read food labels for sodium information? The Nutrition Facts label lists the amount of sodium in one serving of the food. If you eat more than one serving, you must multiply the listed amount of sodium by the  number of servings. Food labels may also identify foods as:  Sodium free-Less than 5 mg in a serving.  Very low sodium-35 mg or less in a serving.  Low sodium-140 mg or less in a serving.  Light in sodium-50% less sodium in a serving. For example, if a food that usually has 300 mg of sodium is changed to become light in sodium, it will have 150 mg of sodium.  Reduced sodium-25% less sodium in a serving. For example, if a food that usually has 400 mg of sodium is changed to reduced sodium, it will have 300 mg of sodium. What foods can I eat? Grains  Low-sodium cereals, including oats, puffed wheat and rice, and shredded wheat cereals. Low-sodium crackers. Unsalted rice and pasta. Lower-sodium bread. Vegetables  Frozen or fresh vegetables. Low-sodium or reduced-sodium canned vegetables. Low-sodium or reduced-sodium tomato sauce and paste. Low-sodium or reduced-sodium tomato and vegetable juices. Fruits  Fresh, frozen, and canned fruit. Fruit juice. Meat and Other Protein Products  Low-sodium canned tuna and salmon. Fresh or frozen meat, poultry, seafood, and fish. Lamb. Unsalted nuts. Dried beans, peas, and lentils without added salt. Unsalted canned beans. Homemade soups without salt. Eggs. Dairy  Milk. Soy milk. Ricotta cheese. Low-sodium or reduced-sodium cheeses. Yogurt. Condiments  Fresh and dried herbs and spices. Salt-free seasonings. Onion and garlic powders. Low-sodium varieties of mustard and ketchup. Fresh or refrigerated horseradish. Lemon juice. Fats and Oils  Reduced-sodium salad dressings. Unsalted butter. Other  Unsalted popcorn and pretzels. The items listed above may not be a complete list of recommended foods or beverages. Contact your dietitian for more options.  What foods are not recommended? Grains  Instant hot cereals. Bread stuffing, pancake, and biscuit mixes. Croutons. Seasoned rice or pasta mixes. Noodle soup cups. Boxed or frozen macaroni and cheese.  Self-rising flour. Regular salted crackers. Vegetables  Regular canned vegetables. Regular canned tomato sauce and paste. Regular tomato and vegetable juices. Frozen vegetables in sauces. Salted [place] fries. Olives. Angie Fava. Relishes. Sauerkraut. Salsa. Meat and Other Protein Products  Salted, canned, smoked, spiced, or pickled meats, seafood, or fish. Bacon, ham, sausage, hot dogs, corned beef, chipped beef, and packaged luncheon meats. Salt pork. Jerky. Pickled herring. Anchovies, regular canned tuna, and sardines. Salted nuts. Dairy  Processed cheese and cheese spreads. Cheese curds. Blue cheese and cottage cheese. Buttermilk. Condiments  Onion and garlic salt, seasoned salt, table salt, and sea salt. Canned and packaged gravies. Worcestershire sauce. Tartar sauce. Barbecue sauce. Teriyaki sauce. Soy sauce, including reduced sodium. Steak sauce. Fish sauce. Oyster sauce. Cocktail sauce. Horseradish that you find on the shelf. Regular ketchup and mustard. Meat flavorings and tenderizers. Bouillon cubes. Hot sauce. Tabasco sauce. Marinades. Taco seasonings. Relishes. Fats and Oils  Regular salad dressings. Salted butter. Margarine. Ghee. Bacon fat. Other  Potato and tortilla chips. Corn chips and puffs. Salted popcorn and pretzels. Canned or dried soups. Pizza. Frozen entrees and pot pies. The items listed  above may not be a complete list of foods and beverages to avoid. Contact your dietitian for more information.  This information is not intended to replace advice given to you by your health care provider. Make sure you discuss any questions you have with your health care provider. Document Released: 02/03/2002 Document Revised: 01/20/2016 Document Reviewed: 06/18/2013 Elsevier Interactive Patient Education  2017 Elsevier Inc.  -  Diabetes Mellitus and Food It is important for you to manage your blood sugar (glucose) level. Your blood glucose level can be greatly affected by what you eat.  Eating healthier foods in the appropriate amounts throughout the day at about the same time each day will help you control your blood glucose level. It can also help slow or prevent worsening of your diabetes mellitus. Healthy eating may even help you improve the level of your blood pressure and reach or maintain a healthy weight. General recommendations for healthful eating and cooking habits include:  Eating meals and snacks regularly. Avoid going long periods of time without eating to lose weight.  Eating a diet that consists mainly of plant-based foods, such as fruits, vegetables, nuts, legumes, and whole grains.  Using low-heat cooking methods, such as baking, instead of high-heat cooking methods, such as deep frying. Work with your dietitian to make sure you understand how to use the Nutrition Facts information on food labels. How can food affect me? Carbohydrates  Carbohydrates affect your blood glucose level more than any other type of food. Your dietitian will help you determine how many carbohydrates to eat at each meal and teach you how to count carbohydrates. Counting carbohydrates is important to keep your blood glucose at a healthy level, especially if you are using insulin or taking certain medicines for diabetes mellitus. Alcohol  Alcohol can cause sudden decreases in blood glucose (hypoglycemia), especially if you use insulin or take certain medicines for diabetes mellitus. Hypoglycemia can be a life-threatening condition. Symptoms of hypoglycemia (sleepiness, dizziness, and disorientation) are similar to symptoms of having too much alcohol. If your health care provider has given you approval to drink alcohol, do so in moderation and use the following guidelines:  Women should not have more than one drink per day, and men should not have more than two drinks per day. One drink is equal to:  12 oz of beer.  5 oz of wine.  1 oz of hard liquor.  Do not drink on an empty  stomach.  Keep yourself hydrated. Have water, diet soda, or unsweetened iced tea.  Regular soda, juice, and other mixers might contain a lot of carbohydrates and should be counted. What foods are not recommended? As you make food choices, it is important to remember that all foods are not the same. Some foods have fewer nutrients per serving than other foods, even though they might have the same number of calories or carbohydrates. It is difficult to get your body what it needs when you eat foods with fewer nutrients. Examples of foods that you should avoid that are high in calories and carbohydrates but low in nutrients include:  Trans fats (most processed foods list trans fats on the Nutrition Facts label).  Regular soda.  Juice.  Candy.  Sweets, such as cake, pie, doughnuts, and cookies.  Fried foods. What foods can I eat? Eat nutrient-rich foods, which will nourish your body and keep you healthy. The food you should eat also will depend on several factors, including:  The calories you need.  The medicines  you take.  Your weight.  Your blood glucose level.  Your blood pressure level.  Your cholesterol level. You should eat a variety of foods, including:  Protein.  Lean cuts of meat.  Proteins low in saturated fats, such as fish, egg whites, and beans. Avoid processed meats.  Fruits and vegetables.  Fruits and vegetables that may help control blood glucose levels, such as apples, mangoes, and yams.  Dairy products.  Choose fat-free or low-fat dairy products, such as milk, yogurt, and cheese.  Grains, bread, pasta, and rice.  Choose whole grain products, such as multigrain bread, whole oats, and brown rice. These foods may help control blood pressure.  Fats.  Foods containing healthful fats, such as nuts, avocado, olive oil, canola oil, and fish. Does everyone with diabetes mellitus have the same meal plan? Because every person with diabetes mellitus is  different, there is not one meal plan that works for everyone. It is very important that you meet with a dietitian who will help you create a meal plan that is just right for you. This information is not intended to replace advice given to you by your health care provider. Make sure you discuss any questions you have with your health care provider. Document Released: 05/11/2005 Document Revised: 01/20/2016 Document Reviewed: 07/11/2013 Elsevier Interactive Patient Education  2017 [employer].

## 2016-09-18 NOTE — BH Specialist Note (Signed)
Session Start time: 3:45 PM   End Time: 4:05 PM Total Time:  20 minutes Type of Service: [place]: No.   Interpreter Name & Language: N/A # [facility] Visits July 2017-June 2018: 1st   SUBJECTIVE: Barbara Wang is a 51 y.o. female  Pt. was referred by Dr. Janne Napoleon for:  anxiety, depression and community resources (housing). Pt. reports the following symptoms/concerns: overwhelming feelings of sadness and worry, difficulty sleeping, difficulty concentrating, and irritability Duration of problem:  Ongoing Severity: moderate Previous treatment: Pt has received counseling in the past to address depression. Pt stated that she did not find it beneficial. Pt is not receiving any behavioral health services   OBJECTIVE: Mood: Anxious & Affect: Appropriate Risk of harm to self or others: Pt denied SI/HI Assessments administered: PHQ-9; GAD-7  LIFE CONTEXT:  Family & Social: Pt resided with partner, who was recently incarcerated 06/2016. She is being evicted from home due to inability to pay rent. Pt has two adult daughters (resides in [place]) and mother who provides emotional support School/ Work: Pt is unemployed. She does not receive public benefits and was denied disability Self-Care: Pt reports difficulty sleeping and changes in appetite. No report of substance use Life changes: Pt is being evicted from home due to insufficient finances. She has had difficulty affording medicatons What is important to pt/family (values): Family, Spirituality, and Independence   GOALS ADDRESSED:  Decrease symptoms of depression Decrease symptoms of anxiety  INTERVENTIONS: Solution Focused, Strength-based and Supportive   ASSESSMENT:  Pt currently experiencing depression and anxiety triggered by financial strain due to partner's recent arrest. Pt reports overwhelming feelings of sadness and worry, difficulty sleeping, difficulty concentrating, and irritability.  She has limited support due to inability to reside with adult daughters or mother. Pt may benefit from psychotherapy and medication management. LCSWA educated pt on the cycle of depression and anxiety and discussed healthy coping skills to decrease symptoms. Pt identified coping strategies for current stressors. LCSWA provided pt with community resources for crisis intervention, therapy, medication management, legal aid and rent/utility assistance. Pt has obtained application for Financial Counseling and will schedule appointment in near future.      PLAN: 1. F/U with behavioral health clinician: Pt was encouraged to contact [facility]CSWA if symptoms worsen or fail to improve to schedule behavioral appointments at [facility]. 2. Behavioral Health meds: Lexapro 3. Behavioral recommendations: LCSWA recommends that pt apply healthy coping skills discussed and utilize community resources. Pt is encouraged to schedule follow up appointment with LCSWA 4. Referral: Brief Counseling/Psychotherapy, [employer], Problem-solving teaching/coping strategies, Psychoeducation and Supportive Counseling 5. From scale of 1-10, how likely are you to follow plan: [place], MSW, [place] Worker 09/19/16 11:26 AM  Warmhandoff:   Warm Hand Off Completed.

## 2016-09-18 NOTE — Progress Notes (Signed)
Barbara Wang, is a 51 y.o. female  [id number]  [id number]  DOB - 1966/02/14  CC:  Chief Complaint  Patient presents with  . Establish Care       HPI: Barbara Wang is a 51 y.o. female here today to establish medical care, w/ hx of poorly controlled type 1 dm, insulin dependent, htn, ckd 3, and tob abuse.  Pt is new to our clinic.  Recent admission to hospital 11/4-7/17 for HONK.  Denies any complaints, but under a lot of stress currently. About to be kicked out of her current home next few weeks, no money, stressed about life. Denies si/hi/avh, but does note depression. Would like to start something to help her anxiety/depression.  1/2 ppd, wants to stop, asked about nicoderm patches and chantex.  Gets occasional ha on the back of her head as well, per pt prior admissions x 2 for viral mengitis. Of note, vision blurry as well, has not had eye exam in long time  Recently on abx for tooth infection, currently has vaginal itching, concerned for recurrent yeast infection.  Patient has No headache currently, No chest pain, No abdominal pain - No Nausea, No new weakness tingling or numbness, No Cough - SOB.    Review of Systems: Per hpi, o/w all systems reviewed and negative.    Allergies  Allergen Reactions  . Penicillins Hives and Shortness Of Breath    Has patient had a PCN reaction causing immediate rash, facial/tongue/throat swelling, SOB or lightheadedness with hypotension: Yes Has patient had a PCN reaction causing severe rash involving mucus membranes or skin necrosis: No Has patient had a PCN reaction that required hospitalization No Has patient had a PCN reaction occurring within the last 10 years: No If all of the above answers are "NO", then may proceed with Cephalosporin use.  . Azithromycin Hives  . Sulfa Antibiotics Hives  . Tramadol Hives   Past Medical History:  Diagnosis Date  . Asthma   . Cellulitis 06/2015   rt hand   . Depression    . History of hiatal hernia    "it went away on it's own"  . Hyperlipemia   . Hypertension   . Neuropathy ([place])     "feet & hands "  . Obesity   . Pancreatitis   . Type II diabetes mellitus (HCC)    insulin dependent    Current Outpatient Prescriptions on File Prior to Visit  Medication Sig Dispense Refill  . albuterol (PROAIR HFA) 108 (90 Base) MCG/ACT inhaler Inhale 2 puffs into the lungs every 6 (six) hours as needed for wheezing or shortness of breath.    Marland Kitchen albuterol (PROVENTIL HFA;VENTOLIN HFA) 108 (90 Base) MCG/ACT inhaler Inhale 2 puffs into the lungs every 6 (six) hours as needed for wheezing or shortness of breath.    Marland Kitchen albuterol (PROVENTIL) (2.5 MG/3ML) 0.083% nebulizer solution Take 2.5 mg by nebulization every 6 (six) hours as needed for wheezing or shortness of breath.    . cetirizine (ZYRTEC) 10 MG tablet Take 10 mg by mouth daily as needed for allergies or rhinitis.    . fluticasone (FLONASE) 50 MCG/ACT nasal spray Place 2 sprays into both nostrils 2 (two) times daily as needed for allergies or rhinitis.    . fluticasone (FLOVENT HFA) 220 MCG/ACT inhaler Inhale 2 puffs into the lungs 2 (two) times daily as needed (shortness of breath/ wheezing).    . GuaiFENesin (MUCUS RELIEF ADULT PO) Take 1 tablet by mouth 2 (two) times  daily as needed (cough/ congestion).    Marland Kitchen ibuprofen (ADVIL,MOTRIN) 800 MG tablet Take 800 mg by mouth every 6 (six) hours as needed for headache (pain).   0  . loratadine (CLARITIN) 10 MG tablet Take 10 mg by mouth daily as needed for allergies or rhinitis.   3  . nitrofurantoin, macrocrystal-monohydrate, (MACROBID) 100 MG capsule Take 1 capsule (100 mg total) by mouth every 12 (twelve) hours. 12 capsule 0  . oxyCODONE (OXY IR/ROXICODONE) 5 MG immediate release tablet Take 1 tablet (5 mg total) by mouth every 12 (twelve) hours as needed for severe pain. (Patient not taking: Reported on 07/01/2016) 10 tablet 0  . pantoprazole (PROTONIX) 40 MG tablet Take 1  tablet (40 mg total) by mouth daily. (Patient not taking: Reported on 07/01/2016) 30 tablet 0   No current facility-administered medications on file prior to visit.    Family History  Problem Relation Age of Onset  . Stroke Mother   . Diabetes Father   . Diabetes Sister    Social History   Social History  . Marital status: Married    Spouse name: N/A  . Number of children: N/A  . Years of education: N/A   Occupational History  . Not on file.   Social History Main Topics  . Smoking status: Current Every Day Smoker    Packs/day: 1.00    Years: 40.00    Types: Cigarettes  . Smokeless tobacco: Never Used  . Alcohol use No  . Drug use: No  . Sexual activity: Not Currently    Birth control/ protection: Surgical   Other Topics Concern  . Not on file   Social History Narrative  . No narrative on file    Objective:   Vitals:   09/18/16 1509  BP: (!) 166/88  Pulse: 79  Resp: 16  Temp: 98.5 F (36.9 C)    Filed Weights   09/18/16 1509  Weight: 178 lb 6.4 oz (80.9 kg)    BP Readings from Last 3 Encounters:  09/18/16 (!) 166/88  07/02/16 128/72  02/23/16 140/70    Physical Exam: Constitutional: Patient appears well-developed and well-nourished. No distress. AAOx3, obese, pleasant HENT: Normocephalic, atraumatic, External right and left ear normal. Oropharynx is clear and moist. bilat TMs clear. Eyes: Conjunctivae and EOM are normal. PERRL, no scleral icterus. Neck: Normal ROM. Neck supple. No JVD.  CVS: RRR, S1/S2 +, no murmurs, no gallops, no carotid bruit.  Pulmonary: Effort and breath sounds normal, no stridor, rhonchi, wheezes, rales.  Abdominal: Soft. BS +, no distension, tenderness, rebound or guarding.  Musculoskeletal: Normal range of motion. No edema and no tenderness.  Foot exam: bilateral peripheral pulses 2+ (dorsalis pedis and post tibialis pulses), no ulcers noted/no ecchymosis, warm to touch, monofilament testing 3/3 bilat. Sensation intact.  No  c/c/e.Marland Kitchen Neuro: Alert. muscle tone coordination wnl. No cranial nerve deficit grossly. Skin: Skin is warm and dry. No rash noted. Not diaphoretic. No erythema. No pallor. Psychiatric: Normal mood and affect. Behavior, judgment, thought content normal.  Lab Results  Component Value Date   WBC 6.2 07/01/2016   HGB 14.9 07/01/2016   HCT 43.0 07/01/2016   MCV 82.7 07/01/2016   PLT 290 07/01/2016   Lab Results  Component Value Date   CREATININE 0.91 07/02/2016   BUN 13 07/02/2016   NA 140 07/02/2016   K 3.7 07/02/2016   CL 110 07/02/2016   CO2 25 07/02/2016    Lab Results  Component Value Date  HGBA1C  09/18/2016     Comment:     Greater than 15.0   Lipid Panel     Component Value Date/Time   CHOL 124 01/22/2016 1044   TRIG 127 01/22/2016 1044   HDL 25 (L) 01/22/2016 1044   CHOLHDL 5.0 01/22/2016 1044   VLDL 25 01/22/2016 1044   LDLCALC 74 01/22/2016 1044        Depression screen PHQ 2/9 09/18/2016 02/02/2016  Decreased Interest 1 0  Down, Depressed, Hopeless 3 0  PHQ - 2 Score 4 0  Altered sleeping 3 -  Tired, decreased energy 1 -  Change in appetite 2 -  Feeling bad or failure about yourself  2 -  Trouble concentrating 2 -  Moving slowly or fidgety/restless 0 -  Suicidal thoughts 0 -  PHQ-9 Score 14 -    Assessment and plan:   1. Essential hypertension Uncontrolled, off bp meds for long time. - renew prinzide 10-12.5 qd - rn bp check w/ Travia 2 wks, if sbp >130, increase prinzide to 20-25 qd  2. 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 If cannot tolerate, call me and can switch to longacting metformin. -rx glucomter and supplies  - POCT glucose (manual entry) 330 - -> 298 repeat - POCT glycosylated hemoglobin (Hb A1C) >15 - insulin aspart (novoLOG)  injection 10 Units; Inject 0.1 mLs (10 Units total) into the skin once. - BASIC METABOLIC PANEL WITH GFR - Microalbumin/Creatinine Ratio, Urine - [facility] 2 wks for dm chk. - renewed neurontin 600mg  tid   3. Tobacco abuse Would avoid chantex due to s/es, trial nicoderm patch 14mg  daily, instructed not to smoke while on patch, smoking cessation tips provided   4. Possible yeast infection on recent abx recently, and w/ ooc dm, could make it worse - diflucan 150mg  qd x 3 days.  5. Depression Denies si/hi/avh Started pt on lexapro 20mg  qd, Asked Jasmine sw to talk to pt as well to provide available area resources.  Return in about 4 weeks (around 10/16/2016) for depression /anxiety.  The patient was given clear instructions to go to ER or return to medical center if symptoms don't improve, worsen or new problems develop. The patient verbalized understanding. The patient was told to call to get lab results if they haven't heard anything in the next week.    This note has been created with Surveyor, quantity. Any transcriptional errors are unintentional.   Maren Reamer, MD, [facility], [place]   09/18/2016, 4:39 PM

## 2016-09-18 NOTE — Addendum Note (Signed)
Addended by: Jackelyn Knife on: 09/18/2016 04:59 PM   Modules accepted: Orders

## 2016-09-19 MED FILL — GABAPENTIN 600 MG TABLET: 600 | 30 days supply | Qty: 90 | Fill #0

## 2016-09-19 MED FILL — ATORVASTATIN 40 MG TABLET: 40 | 30 days supply | Qty: 30 | Fill #0

## 2016-09-19 MED FILL — ESCITALOPRAM 20 MG TABLET: 20 | 30 days supply | Qty: 30 | Fill #0

## 2016-09-25 ENCOUNTER — Other Ambulatory Visit: Payer: Self-pay | Admitting: *Deleted

## 2016-09-25 MED ORDER — INSULIN GLARGINE 100 UNIT/ML SOLOSTAR PEN: 50.0000 [IU] | PEN_INJECTOR | Freq: Two times a day (BID) | SUBCUTANEOUS | 3 refills | Status: DC

## 2016-09-25 MED ORDER — INSULIN NPH (HUMAN) (ISOPHANE) 100 UNIT/ML ~~LOC~~ SUSP: 1.0000 [IU] | Freq: Three times a day (TID) | SUBCUTANEOUS | 3 refills | Status: DC

## 2016-09-25 NOTE — Telephone Encounter (Signed)
PRINTED FOR PASS PROGRAM 

## 2016-10-03 ENCOUNTER — Ambulatory Visit: Payer: Self-pay | Attending: Internal Medicine | Admitting: Pharmacist

## 2016-10-03 ENCOUNTER — Encounter: Payer: Self-pay | Admitting: Pharmacist

## 2016-10-03 ENCOUNTER — Ambulatory Visit: Payer: Self-pay

## 2016-10-03 VITALS — BP 153/82

## 2016-10-03 DIAGNOSIS — I10 Essential (primary) hypertension: Secondary | ICD-10-CM | POA: Insufficient documentation

## 2016-10-03 DIAGNOSIS — E1165 Type 2 diabetes mellitus with hyperglycemia: Secondary | ICD-10-CM

## 2016-10-03 DIAGNOSIS — Z720 Tobacco use: Secondary | ICD-10-CM | POA: Insufficient documentation

## 2016-10-03 DIAGNOSIS — R351 Nocturia: Secondary | ICD-10-CM | POA: Insufficient documentation

## 2016-10-03 DIAGNOSIS — Z794 Long term (current) use of insulin: Secondary | ICD-10-CM | POA: Insufficient documentation

## 2016-10-03 DIAGNOSIS — E1140 Type 2 diabetes mellitus with diabetic neuropathy, unspecified: Secondary | ICD-10-CM | POA: Insufficient documentation

## 2016-10-03 DIAGNOSIS — Z590 Homelessness: Secondary | ICD-10-CM | POA: Insufficient documentation

## 2016-10-03 MED ORDER — LISINOPRIL-HYDROCHLOROTHIAZIDE 10-12.5 MG PO TABS: 2.0000 | ORAL_TABLET | Freq: Every day | ORAL | 3 refills | Status: DC

## 2016-10-03 MED ORDER — HYDROCORTISONE 0.5 % EX CREA: 1.0000 "application " | TOPICAL_CREAM | Freq: Two times a day (BID) | CUTANEOUS | 0 refills | Status: DC

## 2016-10-03 MED ORDER — PANTOPRAZOLE SODIUM 40 MG PO TBEC: 40.0000 mg | DELAYED_RELEASE_TABLET | Freq: Every day | ORAL | 0 refills | Status: DC

## 2016-10-03 MED ORDER — ALBUTEROL SULFATE HFA 108 (90 BASE) MCG/ACT IN AERS: 2.0000 | INHALATION_SPRAY | Freq: Four times a day (QID) | RESPIRATORY_TRACT | 0 refills | Status: DC | PRN

## 2016-10-03 MED ORDER — METFORMIN HCL ER 500 MG PO TB24: 500.0000 mg | ORAL_TABLET | Freq: Every day | ORAL | 2 refills | Status: DC

## 2016-10-03 MED ORDER — IBUPROFEN 800 MG PO TABS: 800.0000 mg | ORAL_TABLET | Freq: Four times a day (QID) | ORAL | 0 refills | Status: DC | PRN

## 2016-10-03 NOTE — Progress Notes (Signed)
    S:    Patient arrives in good spirits.  Presents for diabetes evaluation, education, and management at the request of Dr/ Langeland. Patient was referred on 09/18/16.  Patient was last seen by Primary Care Provider on 09/18/16.   Patient reports adherence with medications.  Current diabetes medications include: Lantus 50 units BID, regular insulin 10 units TID Current hypertension medications include: lisinopril 20-12.5 mg daily  Patient denies hypoglycemic events.  Patient reported dietary habits: patient is homeless so she eats what she is given and able to get.  Patient reported exercise habits: walking   Patient reports nocturia.  Patient reports neuropathy. Patient reports visual changes. Patient reports self foot exams.   Patient reports GI upset with metformin.  Patient is very interested in smoking cessation. A lot of her friends have quit using Chantix and she would really like to try it.    O:  Lab Results  Component Value Date   HGBA1C  09/18/2016     Comment:     Greater than 15.0   Vitals:   10/03/16 1048  BP: (!) 153/82    Home fasting CBG: 100s-130s 2 hour post-prandial/random CBG: <200   A/P: Diabetes longstanding currently UNcontrolled based on A1c >15 but improving based on self CBG monitoring. Patient denies hypoglycemic events and is able to verbalize appropriate hypoglycemia management plan. Patient reports adherence with medication. Control is suboptimal due to food insecurity.  Continue Lantus 50 units BID and insulin regular 10 units TID. She does have a few outliers but she truly is doing a great job overall. Switched to extended release metformin to hopefully mitigate GI upset.   Next A1C anticipated April 2018.    Hypertension longstanding currently uncontrolled based on reading of 153/82.  Patient reports adherence with medication. Control is suboptimal due to lack of physical activity and inability to reall control salt intake. Will  increase lisinopril from 20-12.5 to 40-25 mg daily. Patient can double up on tablets for now due to financial constraints but new dose has been ordered.  Tobacco abuse longstanding currently interested in quitting smoking. Chantix appears to be an option due to a financial assistance program through the drug company. Patches were also ordered for patient. Will ask Dr. Janne Napoleon if Chantix is appropriate. Will assist with smoking cessation efforts once she is able to access medication.   Written patient instructions provided.  Total time in face to face counseling 30 minutes.   Follow up in Pharmacist Clinic Visit in 2 weeks.   Patient seen with Windell Moulding, PharmD Candidate.

## 2016-10-03 NOTE — Patient Instructions (Addendum)
Meet with Barbara Wang in the pharmacy to start the PASS program for Chantix  Continue your insulin at the current doses.  Increase lisinopril-HCTZ to two tablets  Switched metformin to extended release to help with stomach upset.  Come back after you get set up with the pharmacy for Chantix and we will help you quit smoking!   Hypoglycemia  Hypoglycemia is when the sugar (glucose) level in the blood is too low. Symptoms of low blood sugar may include:  Feeling:  Hungry.  Worried or nervous (anxious).  Sweaty and clammy.  Confused.  Dizzy.  Sleepy.  Sick to your stomach (nauseous).  Having:  A fast heartbeat.  A headache.  A change in your vision.  Jerky movements that you cannot control (seizure).  Nightmares.  Tingling or no feeling (numbness) around the mouth, lips, or tongue.  Having trouble with:  Talking.  Paying attention (concentrating).  Moving (coordination).  Sleeping.  Shaking.  Passing out (fainting).  Getting upset easily (irritability). Low blood sugar can happen to people who have diabetes and people who do not have diabetes. Low blood sugar can happen quickly, and it can be an emergency. Treating Low Blood Sugar  Low blood sugar is often treated by eating or drinking something sugary right away. If you can think clearly and swallow safely, follow the 15:15 rule:  Take 15 grams of a fast-acting carb (carbohydrate). Some fast-acting carbs are:  1 tube of glucose gel.  3 sugar tablets (glucose pills).  6-8 pieces of hard candy.  4 oz (120 mL) of fruit juice.  4 oz (120 mL) of regular (not diet) soda.  Check your blood sugar 15 minutes after you take the carb.  If your blood sugar is still at or below 70 mg/dL (3.9 mmol/L), take 15 grams of a carb again.  If your blood sugar does not go above 70 mg/dL (3.9 mmol/L) after 3 tries, get help right away.  After your blood sugar goes back to normal, eat a meal or a snack within 1  hour. Treating Very Low Blood Sugar  If your blood sugar is at or below 54 mg/dL (3 mmol/L), you have very low blood sugar (severe hypoglycemia). This is an emergency. Do not wait to see if the symptoms will go away. Get medical help right away. Call your local emergency services (911 in the [place] Do not drive yourself to the hospital. If you have very low blood sugar and you cannot eat or drink, you may need a glucagon shot (injection). A family member or friend should learn how to check your blood sugar and how to give you a glucagon shot. Ask your doctor if you need to have a glucagon shot kit at home. Follow these instructions at home: General instructions  Avoid any diets that cause you to not eat enough food. Talk with your doctor before you start any new diet.  Take over-the-counter and prescription medicines only as told by your doctor.  Limit alcohol to no more than 1 drink per day for nonpregnant women and 2 drinks per day for men. One drink equals 12 oz of beer, 5 oz of wine, or 1 oz of hard liquor.  Keep all follow-up visits as told by your doctor. This is important. If You Have Diabetes:   Make sure you know the symptoms of low blood sugar.  Always keep a source of sugar with you, such as:  Sugar.  Sugar tablets.  Glucose gel.  Fruit juice.  Regular  soda (not diet soda).  Milk.  Hard candy.  Honey.  Take your medicines as told.  Follow your exercise and meal plan.  Eat on time. Do not skip meals.  Follow your sick day plan when you cannot eat or drink normally. Make this plan ahead of time with your doctor.  Check your blood sugar as often as told by your doctor. Always check before and after exercise.  Share your diabetes care plan with:  Your work or school.  People you live with.  Check your pee (urine) for ketones:  When you are sick.  As told by your doctor.  Carry a card or wear jewelry that says you have diabetes. If You Have Low Blood  Sugar From Other Causes:   Check your blood sugar as often as told by your doctor.  Follow instructions from your doctor about what you cannot eat or drink. Contact a doctor if:  You have trouble keeping your blood sugar in your target range.  You have low blood sugar often. Get help right away if:  You still have symptoms after you eat or drink something sugary.  Your blood sugar is at or below 54 mg/dL (3 mmol/L).  You have jerky movements that you cannot control.  You pass out. These symptoms may be an emergency. Do not wait to see if the symptoms will go away. Get medical help right away. Call your local emergency services (911 in the [place] Do not drive yourself to the hospital.  This information is not intended to replace advice given to you by your health care provider. Make sure you discuss any questions you have with your health care provider. Document Released: 11/08/2009 Document Revised: 01/20/2016 Document Reviewed: 09/17/2015 Elsevier Interactive Patient Education  2017 [employer].

## 2016-11-13 MED FILL — ?PANTOPRAZOLE SOD DR 40MG: 40 MG | 30 days supply | Qty: 30 | Fill #0

## 2016-11-13 MED FILL — LISINOPRIL-HCTZ 10-12.5 MG: 10-12.5 | 30 days supply | Qty: 60 | Fill #0

## 2016-11-13 MED FILL — VENTOLIN HFA 90 MCG INHALER: 108 (90 BAS | 25 days supply | Qty: 18 | Fill #0

## 2016-11-13 MED FILL — METFORMIN HCL ER 500 MG TAB: 500 | 30 days supply | Qty: 30 | Fill #0

## 2016-11-13 MED FILL — $LANTUS SOLOSTAR 100 UNITS/: 100 | 15 days supply | Qty: 15 | Fill #1

## 2016-11-13 MED FILL — IBUPROFEN 800 MG TABLET: 800 | 8 days supply | Qty: 30 | Fill #0

## 2016-11-16 MED FILL — ?ESCITALOPRAM 20 MG TABLET: 20 | 30 days supply | Qty: 30 | Fill #1

## 2016-11-22 ENCOUNTER — Other Ambulatory Visit: Payer: Self-pay | Admitting: Pharmacist

## 2016-11-22 MED ORDER — INSULIN REGULAR HUMAN 100 UNIT/ML IJ SOLN: 10.0000 [IU] | Freq: Three times a day (TID) | INTRAMUSCULAR | 3 refills | Status: DC

## 2016-11-29 ENCOUNTER — Telehealth: Payer: Self-pay

## 2016-11-29 NOTE — Telephone Encounter (Signed)
Called to find out if pt wants colonoscopy scheduled

## 2016-12-23 ENCOUNTER — Encounter (HOSPITAL_COMMUNITY): Payer: Self-pay | Admitting: Emergency Medicine

## 2016-12-23 ENCOUNTER — Emergency Department (HOSPITAL_COMMUNITY)
Admission: EM | Admit: 2016-12-23 | Discharge: 2016-12-23 | Disposition: A | Discharge status: Home / Self Care | Payer: Self-pay | Attending: Emergency Medicine | Admitting: Emergency Medicine

## 2016-12-23 DIAGNOSIS — E1165 Type 2 diabetes mellitus with hyperglycemia: Secondary | ICD-10-CM | POA: Insufficient documentation

## 2016-12-23 DIAGNOSIS — J45909 Unspecified asthma, uncomplicated: Secondary | ICD-10-CM | POA: Insufficient documentation

## 2016-12-23 DIAGNOSIS — R9089 Other abnormal findings on diagnostic imaging of central nervous system: Secondary | ICD-10-CM

## 2016-12-23 DIAGNOSIS — Z7982 Long term (current) use of aspirin: Secondary | ICD-10-CM | POA: Insufficient documentation

## 2016-12-23 DIAGNOSIS — Z794 Long term (current) use of insulin: Secondary | ICD-10-CM | POA: Insufficient documentation

## 2016-12-23 DIAGNOSIS — N390 Urinary tract infection, site not specified: Secondary | ICD-10-CM | POA: Insufficient documentation

## 2016-12-23 DIAGNOSIS — F17210 Nicotine dependence, cigarettes, uncomplicated: Secondary | ICD-10-CM | POA: Insufficient documentation

## 2016-12-23 DIAGNOSIS — G8929 Other chronic pain: Secondary | ICD-10-CM

## 2016-12-23 DIAGNOSIS — Z79899 Other long term (current) drug therapy: Secondary | ICD-10-CM | POA: Insufficient documentation

## 2016-12-23 DIAGNOSIS — I10 Essential (primary) hypertension: Secondary | ICD-10-CM | POA: Insufficient documentation

## 2016-12-23 DIAGNOSIS — R51 Headache: Secondary | ICD-10-CM | POA: Insufficient documentation

## 2016-12-23 LAB — URINALYSIS, ROUTINE W REFLEX MICROSCOPIC
Bilirubin Urine: NEGATIVE
Glucose, UA: >=500 mg/dL — AB
Ketones, ur: NEGATIVE mg/dL
Leukocytes, UA: NEGATIVE
Nitrite: NEGATIVE
Protein, ur: NEGATIVE mg/dL
Specific Gravity, Urine: 1.021 (ref 1.005–1.030)
pH: 5 (ref 5.0–8.0)

## 2016-12-23 LAB — BASIC METABOLIC PANEL
Anion gap: 9 (ref 5–15)
BUN: 22 mg/dL — ABNORMAL HIGH (ref 6–20)
CO2: 28 mmol/L (ref 22–32)
Calcium: 9.4 mg/dL (ref 8.9–10.3)
Chloride: 101 mmol/L (ref 101–111)
Creatinine, Ser: 0.93 mg/dL (ref 0.44–1.00)
GFR calc Af Amer: >60 mL/min (ref >60)
GFR calc non Af Amer: >60 mL/min (ref >60)
Glucose, Bld: 296 mg/dL — ABNORMAL HIGH (ref 65–99)
Potassium: 4.2 mmol/L (ref 3.5–5.1)
Sodium: 138 mmol/L (ref 135–145)

## 2016-12-23 LAB — CBC
HCT: 41.2 % (ref 36.0–46.0)
Hemoglobin: 13.7 g/dL (ref 12.0–15.0)
MCH: 29.8 pg (ref 26.0–34.0)
MCHC: 33.3 g/dL (ref 30.0–36.0)
MCV: 89.6 fL (ref 78.0–100.0)
Platelets: 341 10*3/uL (ref 150–400)
RBC: 4.6 MIL/uL (ref 3.87–5.11)
RDW: 13.1 % (ref 11.5–15.5)
WBC: 8.1 10*3/uL (ref 4.0–10.5)

## 2016-12-23 LAB — CBG MONITORING, ED: Glucose-Capillary: 297 mg/dL — ABNORMAL HIGH (ref 65–99)

## 2016-12-23 MED ORDER — CIPROFLOXACIN HCL 500 MG PO TABS: 500.0000 mg | ORAL_TABLET | Freq: Two times a day (BID) | ORAL | 0 refills | Status: AC

## 2016-12-23 MED ORDER — ACETAMINOPHEN 325 MG PO TABS
650.0000 mg | ORAL_TABLET | Freq: Once | ORAL | Status: AC
  Administered 2016-12-23: 650 mg via ORAL
  Filled 2016-12-23: qty 2

## 2016-12-23 MED ORDER — INSULIN GLARGINE 100 UNIT/ML ~~LOC~~ SOLN
100.0000 [IU] | Freq: Once | SUBCUTANEOUS | Status: AC
Start: 2016-12-23 — End: 2016-12-23
  Administered 2016-12-23: 100 [IU] via SUBCUTANEOUS
  Filled 2016-12-23: qty 1
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
## 2016-12-23 NOTE — ED Notes (Signed)
Pt had CT scan 5 months ago, no tumor noted on CT scan. Pt stated she had a brain tumor in the middle of the year 2017. Pt last took insulin last night.

## 2016-12-23 NOTE — ED Notes (Signed)
No neuro deficits noted in triage, equal grip, AAOX4, no drift, no facial droop.

## 2016-12-23 NOTE — ED Triage Notes (Signed)
Per [facility], pt very vague, poor historian, called out for HA. Pt c/o headache and high blood sugar, and when she coughs "everything goes black". Pt c/o symptoms for "a long time". CBG was 323. Pt states shes been peeing and pooping on herself and shes just "tired of everything". Lives with fiance. Pt states she was told last year and she had a brain tumor.

## 2016-12-23 NOTE — Discharge Instructions (Signed)
You have a urinary tract infection. Stay very well hydrated with plenty of water throughout the day. Take antibiotic until completed. Alternate between tylenol and motrin as needed for pain and headaches. Take all of your usual home medications, including your insulin. Follow up with your primary care physician in 1 week for recheck of ongoing symptoms, and for referral to a neurologist for ongoing management of your headaches and your abnormal brain MRI finding. Return to ER for emergent changing or worsening of symptoms.  Please seek immediate care if you develop the following: You develop back pain.  Your symptoms are no better, or worse in 3 days. There is severe back pain or lower abdominal pain.  You develop chills.  You have a fever.  There is nausea or vomiting.  There is continued burning or discomfort with urination.

## 2016-12-26 LAB — URINE CULTURE: Culture: >=100000 — AB

## 2016-12-27 ENCOUNTER — Telehealth: Payer: Self-pay | Admitting: Emergency Medicine

## 2016-12-27 NOTE — Telephone Encounter (Signed)
Post ED Visit - Positive Culture Follow-up  Culture report reviewed by antimicrobial stewardship pharmacist:  []  Elenor Quinones, Pharm.D. []  Heide Guile, Pharm.D., BCPS AQ-ID []  Parks Neptune, Pharm.D., BCPS []  Alycia Rossetti, Pharm.D., BCPS []  [place], Pharm.D., BCPS, AAHIVP []  Legrand Como, Pharm.D., BCPS, AAHIVP []  Salome Arnt, PharmD, BCPS []  Dimitri Ped, PharmD, BCPS []  Vincenza Hews, PharmD, BCPS  Positive urine culture Treated with ciprofloxacin, organism sensitive to the same and no further patient follow-up is required at this time.  Hazle Nordmann 12/27/2016, 12:10 PM

## 2017-01-05 MED FILL — $HUMULIN R 100 UNITS/ML VIA: 100 | 30 days supply | Qty: 10 | Fill #0

## 2017-01-05 MED FILL — METFORMIN HCL ER 500 MG TAB: 500 | 30 days supply | Qty: 30 | Fill #1

## 2017-01-05 MED FILL — ?ESCITALOPRAM 20 MG TABLET: 20 | 30 days supply | Qty: 30 | Fill #2

## 2017-01-05 MED FILL — LISINOPRIL-HCTZ 10-12.5 MG: 10-12.5 | 30 days supply | Qty: 60 | Fill #1

## 2017-01-05 MED FILL — $LANTUS SOLOSTAR 100 UNITS/: 100 | 30 days supply | Qty: 30 | Fill #0

## 2017-01-08 ENCOUNTER — Ambulatory Visit: Payer: Self-pay | Admitting: Internal Medicine

## 2017-01-08 ENCOUNTER — Ambulatory Visit: Payer: Self-pay | Attending: Internal Medicine | Admitting: Internal Medicine

## 2017-01-08 ENCOUNTER — Encounter: Payer: Self-pay | Admitting: Internal Medicine

## 2017-01-08 VITALS — BP 101/66 | HR 111 | Temp 97.7°F | Resp 16 | Wt 186.0 lb

## 2017-01-08 DIAGNOSIS — D496 Neoplasm of unspecified behavior of brain: Secondary | ICD-10-CM | POA: Insufficient documentation

## 2017-01-08 DIAGNOSIS — Z794 Long term (current) use of insulin: Secondary | ICD-10-CM | POA: Insufficient documentation

## 2017-01-08 DIAGNOSIS — Z7982 Long term (current) use of aspirin: Secondary | ICD-10-CM | POA: Insufficient documentation

## 2017-01-08 DIAGNOSIS — Z888 Allergy status to other drugs, medicaments and biological substances status: Secondary | ICD-10-CM | POA: Insufficient documentation

## 2017-01-08 DIAGNOSIS — Z5900 Homelessness unspecified: Secondary | ICD-10-CM

## 2017-01-08 DIAGNOSIS — E785 Hyperlipidemia, unspecified: Secondary | ICD-10-CM | POA: Insufficient documentation

## 2017-01-08 DIAGNOSIS — J45909 Unspecified asthma, uncomplicated: Secondary | ICD-10-CM | POA: Insufficient documentation

## 2017-01-08 DIAGNOSIS — IMO0002 Reserved for concepts with insufficient information to code with codable children: Secondary | ICD-10-CM

## 2017-01-08 DIAGNOSIS — K449 Diaphragmatic hernia without obstruction or gangrene: Secondary | ICD-10-CM | POA: Insufficient documentation

## 2017-01-08 DIAGNOSIS — E1065 Type 1 diabetes mellitus with hyperglycemia: Secondary | ICD-10-CM

## 2017-01-08 DIAGNOSIS — F419 Anxiety disorder, unspecified: Secondary | ICD-10-CM | POA: Insufficient documentation

## 2017-01-08 DIAGNOSIS — F329 Major depressive disorder, single episode, unspecified: Secondary | ICD-10-CM | POA: Insufficient documentation

## 2017-01-08 DIAGNOSIS — R930 Abnormal findings on diagnostic imaging of skull and head, not elsewhere classified: Secondary | ICD-10-CM | POA: Insufficient documentation

## 2017-01-08 DIAGNOSIS — R519 Headache, unspecified: Secondary | ICD-10-CM

## 2017-01-08 DIAGNOSIS — K859 Acute pancreatitis without necrosis or infection, unspecified: Secondary | ICD-10-CM | POA: Insufficient documentation

## 2017-01-08 DIAGNOSIS — I10 Essential (primary) hypertension: Secondary | ICD-10-CM | POA: Insufficient documentation

## 2017-01-08 DIAGNOSIS — R197 Diarrhea, unspecified: Secondary | ICD-10-CM | POA: Insufficient documentation

## 2017-01-08 DIAGNOSIS — R159 Full incontinence of feces: Secondary | ICD-10-CM | POA: Insufficient documentation

## 2017-01-08 DIAGNOSIS — J9801 Acute bronchospasm: Secondary | ICD-10-CM

## 2017-01-08 DIAGNOSIS — E1040 Type 1 diabetes mellitus with diabetic neuropathy, unspecified: Secondary | ICD-10-CM

## 2017-01-08 DIAGNOSIS — R32 Unspecified urinary incontinence: Secondary | ICD-10-CM | POA: Insufficient documentation

## 2017-01-08 DIAGNOSIS — R51 Headache: Secondary | ICD-10-CM | POA: Insufficient documentation

## 2017-01-08 DIAGNOSIS — E1100 Type 2 diabetes mellitus with hyperosmolarity without nonketotic hyperglycemic-hyperosmolar coma (NKHHC): Secondary | ICD-10-CM | POA: Insufficient documentation

## 2017-01-08 DIAGNOSIS — Z79899 Other long term (current) drug therapy: Secondary | ICD-10-CM | POA: Insufficient documentation

## 2017-01-08 DIAGNOSIS — E669 Obesity, unspecified: Secondary | ICD-10-CM | POA: Insufficient documentation

## 2017-01-08 DIAGNOSIS — Z880 Allergy status to penicillin: Secondary | ICD-10-CM | POA: Insufficient documentation

## 2017-01-08 DIAGNOSIS — E1101 Type 2 diabetes mellitus with hyperosmolarity with coma: Secondary | ICD-10-CM

## 2017-01-08 LAB — POCT URINALYSIS DIPSTICK
Bilirubin, UA: NEGATIVE
Blood, UA: NEGATIVE
Glucose, UA: >1000
Ketones, UA: NEGATIVE
Leukocytes, UA: NEGATIVE
Nitrite, UA: NEGATIVE
Protein, UA: NEGATIVE
Spec Grav, UA: 1.02 (ref 1.010–1.025)
Urobilinogen, UA: 0.2 E.U./dL
pH, UA: 5 (ref 5.0–8.0)

## 2017-01-08 LAB — GLUCOSE, POCT (MANUAL RESULT ENTRY)
POC Glucose: 521 mg/dl — AB (ref 70–99)
POC Glucose: 547 mg/dl — AB (ref 70–99)
POC Glucose: 401 mg/dl — AB (ref 70–99)

## 2017-01-08 LAB — POCT GLYCOSYLATED HEMOGLOBIN (HGB A1C): Hemoglobin A1C: 12.2

## 2017-01-08 MED ORDER — LISINOPRIL-HYDROCHLOROTHIAZIDE 10-12.5 MG PO TABS: 2.0000 | ORAL_TABLET | Freq: Every day | ORAL | 3 refills | Status: DC

## 2017-01-08 MED ORDER — METFORMIN HCL ER 500 MG PO TB24: 500.0000 mg | ORAL_TABLET | Freq: Two times a day (BID) | ORAL | 2 refills | Status: DC

## 2017-01-08 MED ORDER — ATORVASTATIN CALCIUM 40 MG PO TABS: 40.0000 mg | ORAL_TABLET | Freq: Every day | ORAL | 3 refills | Status: DC

## 2017-01-08 MED ORDER — ALBUTEROL SULFATE HFA 108 (90 BASE) MCG/ACT IN AERS: 2.0000 | INHALATION_SPRAY | Freq: Four times a day (QID) | RESPIRATORY_TRACT | 11 refills | Status: DC | PRN

## 2017-01-08 MED ORDER — INSULIN DETEMIR 100 UNIT/ML FLEXPEN: 50.0000 [IU] | PEN_INJECTOR | Freq: Two times a day (BID) | SUBCUTANEOUS | 3 refills | Status: DC

## 2017-01-08 MED ORDER — INSULIN GLARGINE 100 UNIT/ML SOLOSTAR PEN: 50.0000 [IU] | PEN_INJECTOR | Freq: Two times a day (BID) | SUBCUTANEOUS | 3 refills | Status: DC

## 2017-01-08 MED ORDER — METFORMIN HCL ER 500 MG PO TB24: 500.0000 mg | ORAL_TABLET | Freq: Every day | ORAL | 2 refills | Status: DC

## 2017-01-08 MED ORDER — INSULIN ASPART 100 UNIT/ML ~~LOC~~ SOLN
20.0000 [IU] | Freq: Once | SUBCUTANEOUS | Status: AC
  Administered 2017-01-08: 20 [IU] via SUBCUTANEOUS

## 2017-01-08 MED ORDER — ESCITALOPRAM OXALATE 20 MG PO TABS: 20.0000 mg | ORAL_TABLET | Freq: Every day | ORAL | 2 refills | Status: DC

## 2017-01-08 MED ORDER — INSULIN REGULAR HUMAN 100 UNIT/ML IJ SOLN: 10.0000 [IU] | Freq: Three times a day (TID) | INTRAMUSCULAR | 3 refills | Status: DC

## 2017-01-08 MED ORDER — INSULIN REGULAR HUMAN 100 UNIT/ML IJ SOLN: 20.0000 [IU] | Freq: Three times a day (TID) | INTRAMUSCULAR | 3 refills | Status: DC

## 2017-01-08 MED ORDER — GUAIFENESIN ER 600 MG PO TB12: 600.0000 mg | ORAL_TABLET | Freq: Two times a day (BID) | ORAL | 0 refills | Status: DC

## 2017-01-08 MED FILL — ATORVASTATIN 40 MG TABLET: 40 | 30 days supply | Qty: 30 | Fill #0

## 2017-01-08 MED FILL — PROAIR HFA 90 MCG INHALER: 108 (90 BAS | 25 days supply | Qty: 9 | Fill #0

## 2017-01-08 NOTE — Progress Notes (Signed)
Barbara Wang, is a 51 y.o. female  TDD:[id number]  WCB:[id number]  DOB - 18-Feb-1966  Chief Complaint  Patient presents with  . Follow-up        Subjective:   Barbara Wang is a 51 y.o. female here today for a follow up visit for ooc dm, homelessness, anxiety/depression, chronic ha and ht. Last seen 09/18/16. Of note, she was seen in ED 12/23/16 for intractable HAs. At that time she told ER she was diagnosed w/ "brain tumor" when she was in [place], and given referral to see neurology, which she never did. Of note, in [place], she had Medicaid, but was declined that when she came to [place].  She mentions her HA come and go, currently denies ha, but they have been about same and no significant changes. Denies any visual changes.  She notes she is staying at her mother's assisted living facility every other day (can only stay there 3 x /wk) and in her car. She notes that she is transportation issues and is relying on someone to take her to appts, so I am not certain if she actually has a car.  She c/o of diarrhea, that has been ongoing for months, but has been getting worse to include nighttime incontinence as well.  Notes n/v last few days, w/ emesis yesterday but none today.  She has not been on any insulin for at least 1 wk, but going to pick up refills today.  She has been coughing   A bit more, nonproductive, and has hard time catching her breath as well.  She declined nebulizer treatment today, she will do a treatment when gets back to her Mom's house today.   Patient has No headache, No chest pain, No abdominal pain - No Nausea, No new weakness tingling or numbness, No Cough - SOB.  No problems updated.  ALLERGIES: Allergies  Allergen Reactions  . Penicillins Hives and Shortness Of Breath    Has patient had a PCN reaction causing immediate rash, facial/tongue/throat swelling, SOB or lightheadedness with hypotension: Yes Has patient had a PCN reaction causing severe rash  involving mucus membranes or skin necrosis: No Has patient had a PCN reaction that required hospitalization No Has patient had a PCN reaction occurring within the last 10 years: No If all of the above answers are "NO", then may proceed with Cephalosporin use.  . Azithromycin Hives  . Sulfa Antibiotics Hives  . Tramadol Hives    PAST MEDICAL HISTORY: Past Medical History:  Diagnosis Date  . Asthma   . Cellulitis 06/2015   rt hand   . Depression   . History of hiatal hernia    "it went away on it's own"  . Hyperlipemia   . Hypertension   . Neuropathy     "feet & hands "  . Obesity   . Pancreatitis   . Type II diabetes mellitus (HCC)    insulin dependent     MEDICATIONS AT HOME: Prior to Admission medications   Medication Sig Start Date End Date Taking? Authorizing Provider  albuterol (PROAIR HFA) 108 (90 Base) MCG/ACT inhaler Inhale 2 puffs into the lungs every 6 (six) hours as needed for wheezing or shortness of breath. 01/08/17   Zulay Corrie, Leda Quail, MD  albuterol (PROVENTIL HFA;VENTOLIN HFA) 108 (90 Base) MCG/ACT inhaler Inhale 2 puffs into the lungs every 6 (six) hours as needed for wheezing or shortness of breath. 10/03/16   Maren Reamer, MD  albuterol (PROVENTIL) (2.5 MG/3ML) 0.083% nebulizer solution  Take 2.5 mg by nebulization every 6 (six) hours as needed for wheezing or shortness of breath.    [provider]  aspirin 81 MG EC tablet Take 1 tablet (81 mg total) by mouth daily. 09/18/16   Maren Reamer, MD  atorvastatin (LIPITOR) 40 MG tablet Take 1 tablet (40 mg total) by mouth daily at 6 PM. 01/08/17   Ronella Plunk T, MD  Blood Glucose Monitoring Suppl (TRUE METRIX GO GLUCOSE METER) w/Device KIT 1 each by Does not apply route every 8 (eight) hours as needed. 09/18/16   Teniola Tseng, Leda Quail, MD  escitalopram (LEXAPRO) 20 MG tablet Take 1 tablet (20 mg total) by mouth at bedtime. Take 1/2 pill x 4 days, than take 1 tab daily 09/18/16   Lottie Mussel T, MD    fluticasone [facility]) 50 MCG/ACT nasal spray Place 2 sprays into both nostrils 2 (two) times daily as needed for allergies or rhinitis.    [provider]  fluticasone (FLOVENT HFA) 220 MCG/ACT inhaler Inhale 2 puffs into the lungs 2 (two) times daily as needed (shortness of breath/ wheezing).    [provider]  gabapentin (NEURONTIN) 600 MG tablet Take 1 tablet (600 mg total) by mouth 3 (three) times daily. 09/18/16   Lottie Mussel T, MD  glucose blood test strip Use as instructed 09/18/16   Lottie Mussel T, MD  guaiFENesin (MUCINEX) 600 MG 12 hr tablet Take 1 tablet (600 mg total) by mouth 2 (two) times daily. 01/08/17   Maren Reamer, MD  hydrocortisone cream 0.5 % Apply 1 application topically 2 (two) times daily. 10/03/16   Tresa Garter, MD  ibuprofen (ADVIL,MOTRIN) 800 MG tablet Take 1 tablet (800 mg total) by mouth every 6 (six) hours as needed for headache (pain). 10/03/16   Maren Reamer, MD  Insulin Detemir (LEVEMIR) 100 UNIT/ML Pen Inject 50 Units into the skin 2 (two) times daily. 01/08/17   Maren Reamer, MD  Insulin Glargine (LANTUS) 100 UNIT/ML Solostar Pen Inject 50 Units into the skin 2 (two) times daily. 01/08/17   Mateya Torti, Leda Quail, MD  insulin regular (HUMULIN R) 100 units/mL injection Inject 0.2 mLs (20 Units total) into the skin 3 (three) times daily before meals. 01/08/17   Maren Reamer, MD  lisinopril-hydrochlorothiazide (PRINZIDE,ZESTORETIC) 10-12.5 MG tablet Take 2 tablets by mouth daily. 01/08/17   Maren Reamer, MD  loratadine (CLARITIN) 10 MG tablet Take 10 mg by mouth daily as needed for allergies or rhinitis.  04/03/16   [provider]  metFORMIN (GLUCOPHAGE XR) 500 MG 24 hr tablet Take 1 tablet (500 mg total) by mouth 2 (two) times daily. 01/08/17   Imraan Wendell, Leda Quail, MD  nicotine (NICODERM CQ) 14 mg/24hr patch Place 1 patch (14 mg total) onto the skin daily. Patient not taking: Reported on 10/03/2016 09/18/16   Lottie Mussel T, MD  pantoprazole (PROTONIX) 40 MG tablet Take 1 tablet (40 mg total) by mouth daily. 10/03/16   Maren Reamer, MD  TRUEPLUS LANCETS 26G MISC 1 each by Does not apply route every 8 (eight) hours as needed. 09/18/16   Maren Reamer, MD     Objective:   Vitals:   01/08/17 1048  BP: 101/66  Pulse: (!) 111  Resp: 16  Temp: 97.7 F (36.5 C)  TempSrc: Oral  SpO2: 97%  Weight: 186 lb (84.4 kg)    Exam General appearance : Awake, alert, not in any distress. Speech Clear. Not toxic looking,  chronically ill appearing, nonprod cough noted throughout exam HEENT: Atraumatic and Normocephalic, pupils equally reactive to light. Neck: supple, no JVD. No cervical lymphadenopathy.  Chest:Good air entry bilaterally, no w/c/r.  Has bronchospasms spells than resolves. CVS: S1 S2 regular, no murmurs/gallups or rubs. Abdomen: Bowel sounds active, obese, Non tender.  No g/r. Extremities: B/L Lower Ext shows no edema, both legs are warm to touch Neurology: Awake alert, and oriented X 3, CN II-XII grossly intact, Non focal Skin:No Rash  Data Review Lab Results  Component Value Date   HGBA1C 12.2 01/08/2017   HGBA1C  09/18/2016     Comment:     Greater than 15.0   HGBA1C 12.7 (H) 02/05/2016    Depression screen PHQ 2/9 01/08/2017 09/18/2016 02/02/2016  Decreased Interest 3 1 0  Down, Depressed, Hopeless 3 3 0  PHQ - 2 Score 6 4 0  Altered sleeping 3 3 -  Tired, decreased energy 3 1 -  Change in appetite 2 2 -  Feeling bad or failure about yourself  3 2 -  Trouble concentrating 2 2 -  Moving slowly or fidgety/restless 1 0 -  Suicidal thoughts 2 0 -  PHQ-9 Score 22 14 -   [facility] MRI brain w/o contrast 04/07/16 Conclusion. 1. 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 No enlargement of sella id'd.  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 2. Otherwise, no intra-axial hemorrhage common forefoot, or mass    Assessment & Plan   1. Type 2 diabetes mellitus with hyperosmolar nonketotic hyperglycemia (HCC) ooc, ran out of her insulin for at least a wk and unable to get here to pick up refills. - CMP and Liver - CBC with Differential - insulin aspart (novoLOG) injection 20 Units; Inject 0.2 mLs (20 Units total) into the skin   X 2 - POCT glucose (manual entry)  547 --> 521 --> 401 -  POCT urinalysis dipstick  - neg Ketones, no nitrates/LE - POCT glycosylated hemoglobin (Hb A1C) 12.2 (from 15) - increased humulin to 20 w/ meal - chg lantus to levemir 50bid for cost savings - increased metformin to 500 bid. - encouraged her to chk her cbgs at least 2x a day. - ada diet discussed, info provided - fu w/ [facility] 1 wk for dm check, and fu w/ MD in 2 wks.  2. dairrhea Annye Rusk incontinence Suspect due to ooc dm, needs better dm control. Recd activia yogurt bid as less expensive form of probiotic for now  3. Abnormal MRI of head We made copy of her report.  Will scan. - mri 04/07/16 at [facility], noted as above - at time she had Medicaid in [place], however she did not f/u w/ Neurology there and ended up moving to [facility], and was unable to renew her medicaid here. - recd financial aid packet,  - neuro eval, and possibly neurosurg when has financial aid.  Will put in referral for now - she is considering going back to [place] (where her brothers are) and thinks she can get her Medicaid back there. - she has CD w/ her of scan.  4. Chronic nonintractable headache, unspecified headache type HA remains unchanged per pt, really needs Neuro and possibly even Neurosrg f/u,  but limited now due to no insurance nature.   5. Essential hypertension,  benign Controlled, continue prinzide 10-12.5 qd  6. Homeless single person As [place] to see pt, provide area resources.  7. Bronchospasm Clear lungs. Pt declined alb neb trx here, continue claritin, recd mucinex and use her alb nebs when she get's to her Mom's.  8. Anxiety/depression, multfatorial, including stressors of health, homelessness Denies si/hi/avh currently Continue lexapro 20 qhs Asked sw / [facility] to see pt as well, appreciate assistance.     Patient have been counseled extensively about nutrition and exercise  Return in about 2 weeks (around 01/22/2017) for ha, ooc dm.  The patient was given clear instructions to go to ER or return to medical center if symptoms don't improve, worsen or new problems develop. The patient verbalized understanding. The patient was told to call to get lab results if they haven't heard anything in the next week.   This note has been created with Surveyor, quantity. Any transcriptional errors are unintentional.   Maren Reamer, MD, [facility] [place], [place]   01/08/2017, 12:18 PM

## 2017-01-08 NOTE — Patient Instructions (Addendum)
- financial aid packet. appt w/ financial counselor  - ASAP  - 1 wk fu - [place] -  DM check   - probiotics daily - trial Activia yogurt 2 times a day  to help with the diarrhea * I increased your short acting Humulin to 20 units with meals.  (short acting insulin) * I changed your lantus to levemir - still 50 units 2 times a day (still a long acting insulin, just cheaper) * I increased your metformin to 2 x a day.  Diabetes Mellitus and Food It is important for you to manage your blood sugar (glucose) level. Your blood glucose level can be greatly affected by what you eat. Eating healthier foods in the appropriate amounts throughout the day at about the same time each day will help you control your blood glucose level. It can also help slow or prevent worsening of your diabetes mellitus. Healthy eating may even help you improve the level of your blood pressure and reach or maintain a healthy weight. General recommendations for healthful eating and cooking habits include:  Eating meals and snacks regularly. Avoid going long periods of time without eating to lose weight.  Eating a diet that consists mainly of plant-based foods, such as fruits, vegetables, nuts, legumes, and whole grains.  Using low-heat cooking methods, such as baking, instead of high-heat cooking methods, such as deep frying. Work with your dietitian to make sure you understand how to use the Nutrition Facts information on food labels. How can food affect me? Carbohydrates  Carbohydrates affect your blood glucose level more than any other type of food. Your dietitian will help you determine how many carbohydrates to eat at each meal and teach you how to count carbohydrates. Counting carbohydrates is important to keep your blood glucose at a healthy level, especially if you are using insulin or taking certain medicines for diabetes mellitus. Alcohol  Alcohol can cause sudden decreases in blood glucose (hypoglycemia),  especially if you use insulin or take certain medicines for diabetes mellitus. Hypoglycemia can be a life-threatening condition. Symptoms of hypoglycemia (sleepiness, dizziness, and disorientation) are similar to symptoms of having too much alcohol. If your health care provider has given you approval to drink alcohol, do so in moderation and use the following guidelines:  Women should not have more than one drink per day, and men should not have more than two drinks per day. One drink is equal to:  12 oz of beer.  5 oz of wine.  1 oz of hard liquor.  Do not drink on an empty stomach.  Keep yourself hydrated. Have water, diet soda, or unsweetened iced tea.  Regular soda, juice, and other mixers might contain a lot of carbohydrates and should be counted. What foods are not recommended? As you make food choices, it is important to remember that all foods are not the same. Some foods have fewer nutrients per serving than other foods, even though they might have the same number of calories or carbohydrates. It is difficult to get your body what it needs when you eat foods with fewer nutrients. Examples of foods that you should avoid that are high in calories and carbohydrates but low in nutrients include:  Trans fats (most processed foods list trans fats on the Nutrition Facts label).  Regular soda.  Juice.  Candy.  Sweets, such as cake, pie, doughnuts, and cookies.  Fried foods. What foods can I eat? Eat nutrient-rich foods, which will nourish your body and keep you  healthy. The food you should eat also will depend on several factors, including:  The calories you need.  The medicines you take.  Your weight.  Your blood glucose level.  Your blood pressure level.  Your cholesterol level. You should eat a variety of foods, including:  Protein.  Lean cuts of meat.  Proteins low in saturated fats, such as fish, egg whites, and beans. Avoid processed meats.  Fruits and  vegetables.  Fruits and vegetables that may help control blood glucose levels, such as apples, mangoes, and yams.  Dairy products.  Choose fat-free or low-fat dairy products, such as milk, yogurt, and cheese.  Grains, bread, pasta, and rice.  Choose whole grain products, such as multigrain bread, whole oats, and brown rice. These foods may help control blood pressure.  Fats.  Foods containing healthful fats, such as nuts, avocado, olive oil, canola oil, and fish. Does everyone with diabetes mellitus have the same meal plan? Because every person with diabetes mellitus is different, there is not one meal plan that works for everyone. It is very important that you meet with a dietitian who will help you create a meal plan that is just right for you. This information is not intended to replace advice given to you by your health care provider. Make sure you discuss any questions you have with your health care provider. Document Released: 05/11/2005 Document Revised: 01/20/2016 Document Reviewed: 07/11/2013 Elsevier Interactive Patient Education  2017 [employer].

## 2017-01-09 ENCOUNTER — Telehealth: Payer: Self-pay

## 2017-01-09 ENCOUNTER — Telehealth: Payer: Self-pay | Admitting: Licensed Clinical Social Worker

## 2017-01-09 LAB — CBC WITH DIFFERENTIAL/PLATELET
Basophils Absolute: 0 10*3/uL (ref 0.0–0.2)
Basos: 1 %
EOS (ABSOLUTE): 0.2 10*3/uL (ref 0.0–0.4)
Eos: 3 %
Hematocrit: 42.6 % (ref 34.0–46.6)
Hemoglobin: 14 g/dL (ref 11.1–15.9)
Immature Grans (Abs): 0 10*3/uL (ref 0.0–0.1)
Immature Granulocytes: 0 %
Lymphocytes Absolute: 2.2 10*3/uL (ref 0.7–3.1)
Lymphs: 31 %
MCH: 29.2 pg (ref 26.6–33.0)
MCHC: 32.9 g/dL (ref 31.5–35.7)
MCV: 89 fL (ref 79–97)
Monocytes Absolute: 0.4 10*3/uL (ref 0.1–0.9)
Monocytes: 6 %
Neutrophils Absolute: 4.1 10*3/uL (ref 1.4–7.0)
Neutrophils: 59 %
Platelets: 306 10*3/uL (ref 150–379)
RBC: 4.79 x10E6/uL (ref 3.77–5.28)
RDW: 13.4 % (ref 12.3–15.4)
WBC: 6.9 10*3/uL (ref 3.4–10.8)

## 2017-01-09 LAB — CMP AND LIVER
ALT: 19 IU/L (ref 0–32)
AST: 19 IU/L (ref 0–40)
Albumin: 4 g/dL (ref 3.5–5.5)
Alkaline Phosphatase: 131 IU/L — ABNORMAL HIGH (ref 39–117)
BUN: 25 mg/dL — ABNORMAL HIGH (ref 6–24)
Bilirubin Total: 0.3 mg/dL (ref 0.0–1.2)
Bilirubin, Direct: 0.12 mg/dL (ref 0.00–0.40)
CO2: 24 mmol/L (ref 18–29)
Calcium: 9.3 mg/dL (ref 8.7–10.2)
Chloride: 101 mmol/L (ref 96–106)
Creatinine, Ser: 1.18 mg/dL — ABNORMAL HIGH (ref 0.57–1.00)
GFR calc Af Amer: 61 mL/min/{1.73_m2} (ref >59)
GFR calc non Af Amer: 53 mL/min/{1.73_m2} — ABNORMAL LOW (ref >59)
Glucose: 443 mg/dL — ABNORMAL HIGH (ref 65–99)
Potassium: 4.5 mmol/L (ref 3.5–5.2)
Sodium: 138 mmol/L (ref 134–144)
Total Protein: 6.7 g/dL (ref 6.0–8.5)

## 2017-01-09 NOTE — Telephone Encounter (Signed)
Contacted pt to go over lab results pt is aware of results and doesn't have any questions or concerns 

## 2017-01-09 NOTE — Telephone Encounter (Signed)
Contacted pt to go over lab results pt is aware and doesn't have any questions or concerns 

## 2017-01-09 NOTE — Telephone Encounter (Signed)
LCSWA attempted to contact pt to follow up on behavioral health screens from prior appointment. LCSWA left message for a return call.  

## 2017-01-10 ENCOUNTER — Telehealth: Payer: Self-pay | Admitting: Licensed Clinical Social Worker

## 2017-01-10 ENCOUNTER — Encounter: Payer: Self-pay | Admitting: Neurology

## 2017-01-10 NOTE — Telephone Encounter (Signed)
LCSWA contacted pt via telephone to discuss behavioral health screens from prior appointment with PCP.   Pt reported that she is "feeling better" physically. She shared that she has been compliant with her medications; however, further disclosed difficulty paying for her meds. Pt is currently unemployed and is not eligible for benefits at this time.   Pt disclosed an increase in depression and anxiety triggered by current homelessness and financial strain. Pt receives limited support, transportation barriers, and bounces from her mother and daughter's residence each week. LCSWA inquired about medication management. Pt is taking prescribed Lexapro, states "it's helped a little but not much". Pt reports hx of suicidal ideations; however, denies current SI/HI/AVH.    LCSWA and pt discussed healthy strategies to utilize to cope with depression and anxiety regarding psychosocial stressors. LCSWA discussed community resources (i.e. Museum/gallery curator) to assist with obtaining housing and/or shelter. Pt was strongly encouraged to complete Financial Counseling application to help with transportation and medication financial assistance. Pt shared that she has met with financial counselor; however, has difficulty obtaining required paperwork (bank statements) to complete application. Pt is unable to schedule an appointment with [facility]; therefore, [facility] emailed crisis intervention, psychotherapy, and housing resources to patient.   Pt thanked LCSWA for following up. Pt was encouraged to contact LCSWA if symptoms worsen or fail to improve to schedule behavioral appointments at [facility].  Christa See, MSW, LCSWA Clinical Social Worker 01/10/17 10:03 AM

## 2017-01-11 ENCOUNTER — Encounter: Payer: Self-pay | Admitting: Internal Medicine

## 2017-01-15 ENCOUNTER — Ambulatory Visit: Payer: Self-pay | Admitting: Internal Medicine

## 2017-01-26 ENCOUNTER — Ambulatory Visit: Payer: Self-pay | Admitting: Internal Medicine

## 2017-03-02 ENCOUNTER — Emergency Department (HOSPITAL_COMMUNITY)
Admission: EM | Admit: 2017-03-02 | Discharge: 2017-03-03 | Disposition: A | Discharge status: Home / Self Care | Payer: Self-pay | Attending: Emergency Medicine | Admitting: Emergency Medicine

## 2017-03-02 ENCOUNTER — Emergency Department (HOSPITAL_COMMUNITY): Payer: Self-pay

## 2017-03-02 DIAGNOSIS — Z79899 Other long term (current) drug therapy: Secondary | ICD-10-CM | POA: Insufficient documentation

## 2017-03-02 DIAGNOSIS — Z7982 Long term (current) use of aspirin: Secondary | ICD-10-CM | POA: Insufficient documentation

## 2017-03-02 DIAGNOSIS — F17210 Nicotine dependence, cigarettes, uncomplicated: Secondary | ICD-10-CM | POA: Insufficient documentation

## 2017-03-02 DIAGNOSIS — E785 Hyperlipidemia, unspecified: Secondary | ICD-10-CM | POA: Insufficient documentation

## 2017-03-02 DIAGNOSIS — I10 Essential (primary) hypertension: Secondary | ICD-10-CM | POA: Insufficient documentation

## 2017-03-02 DIAGNOSIS — Z794 Long term (current) use of insulin: Secondary | ICD-10-CM | POA: Insufficient documentation

## 2017-03-02 DIAGNOSIS — E1140 Type 2 diabetes mellitus with diabetic neuropathy, unspecified: Secondary | ICD-10-CM | POA: Insufficient documentation

## 2017-03-02 DIAGNOSIS — J45909 Unspecified asthma, uncomplicated: Secondary | ICD-10-CM | POA: Insufficient documentation

## 2017-03-02 DIAGNOSIS — N390 Urinary tract infection, site not specified: Secondary | ICD-10-CM | POA: Insufficient documentation

## 2017-03-02 LAB — URINALYSIS, ROUTINE W REFLEX MICROSCOPIC
Bilirubin Urine: NEGATIVE
Glucose, UA: >=500 mg/dL — AB
Ketones, ur: NEGATIVE mg/dL
Nitrite: POSITIVE — AB
Protein, ur: 30 mg/dL — AB
Specific Gravity, Urine: 1.013 (ref 1.005–1.030)
pH: 5 (ref 5.0–8.0)

## 2017-03-02 LAB — CBC WITH DIFFERENTIAL/PLATELET
Basophils Absolute: 0 10*3/uL (ref 0.0–0.1)
Basophils Relative: 0 %
Eosinophils Absolute: 0.1 10*3/uL (ref 0.0–0.7)
Eosinophils Relative: 1 %
HCT: 44 % (ref 36.0–46.0)
Hemoglobin: 15 g/dL (ref 12.0–15.0)
Lymphocytes Relative: 12 %
Lymphs Abs: 1.5 10*3/uL (ref 0.7–4.0)
MCH: 29.9 pg (ref 26.0–34.0)
MCHC: 34.1 g/dL (ref 30.0–36.0)
MCV: 87.8 fL (ref 78.0–100.0)
Monocytes Absolute: 0.6 10*3/uL (ref 0.1–1.0)
Monocytes Relative: 5 %
Neutro Abs: 10.5 10*3/uL — ABNORMAL HIGH (ref 1.7–7.7)
Neutrophils Relative %: 82 %
Platelets: 270 10*3/uL (ref 150–400)
RBC: 5.01 MIL/uL (ref 3.87–5.11)
RDW: 12.8 % (ref 11.5–15.5)
WBC: 12.7 10*3/uL — ABNORMAL HIGH (ref 4.0–10.5)

## 2017-03-02 LAB — COMPREHENSIVE METABOLIC PANEL
ALT: 26 U/L (ref 14–54)
AST: 34 U/L (ref 15–41)
Albumin: 3.7 g/dL (ref 3.5–5.0)
Alkaline Phosphatase: 102 U/L (ref 38–126)
Anion gap: 10 (ref 5–15)
BUN: 17 mg/dL (ref 6–20)
CO2: 25 mmol/L (ref 22–32)
Calcium: 9.5 mg/dL (ref 8.9–10.3)
Chloride: 99 mmol/L — ABNORMAL LOW (ref 101–111)
Creatinine, Ser: 1.24 mg/dL — ABNORMAL HIGH (ref 0.44–1.00)
GFR calc Af Amer: 56 mL/min — ABNORMAL LOW (ref >60)
GFR calc non Af Amer: 49 mL/min — ABNORMAL LOW (ref >60)
Glucose, Bld: 221 mg/dL — ABNORMAL HIGH (ref 65–99)
Potassium: 3.8 mmol/L (ref 3.5–5.1)
Sodium: 134 mmol/L — ABNORMAL LOW (ref 135–145)
Total Bilirubin: 0.7 mg/dL (ref 0.3–1.2)
Total Protein: 7.1 g/dL (ref 6.5–8.1)

## 2017-03-02 LAB — CBG MONITORING, ED: Glucose-Capillary: 202 mg/dL — ABNORMAL HIGH (ref 65–99)

## 2017-03-02 LAB — PROTIME-INR
INR: 0.9
Prothrombin Time: 12.2 seconds (ref 11.4–15.2)

## 2017-03-02 LAB — I-STAT CG4 LACTIC ACID, ED
Lactic Acid, Venous: 1.49 mmol/L (ref 0.5–1.9)
Lactic Acid, Venous: 2.42 mmol/L (ref 0.5–1.9)

## 2017-03-02 MED ORDER — SODIUM CHLORIDE 0.9 % IV BOLUS (SEPSIS)
1000.0000 mL | Freq: Once | INTRAVENOUS | Status: AC
  Administered 2017-03-02: 1000 mL via INTRAVENOUS

## 2017-03-02 MED ORDER — ACETAMINOPHEN 325 MG PO TABS
650.0000 mg | ORAL_TABLET | Freq: Once | ORAL | Status: AC
  Administered 2017-03-02: 650 mg via ORAL
  Filled 2017-03-02: qty 2

## 2017-03-02 MED ORDER — LEVOFLOXACIN IN D5W 500 MG/100ML IV SOLN
500.0000 mg | Freq: Once | INTRAVENOUS | Status: AC
  Administered 2017-03-02: 500 mg via INTRAVENOUS
  Filled 2017-03-02: qty 100

## 2017-03-02 MED ORDER — IBUPROFEN 800 MG PO TABS: 800.0000 mg | ORAL_TABLET | Freq: Three times a day (TID) | ORAL | 0 refills | Status: DC | PRN

## 2017-03-02 MED ORDER — LORAZEPAM 2 MG/ML IJ SOLN
0.5000 mg | Freq: Once | INTRAMUSCULAR | Status: DC
  Filled 2017-03-02: qty 1

## 2017-03-02 MED ORDER — LEVOFLOXACIN 500 MG PO TABS: 500.0000 mg | ORAL_TABLET | Freq: Every day | ORAL | 0 refills | Status: DC

## 2017-03-02 NOTE — ED Notes (Signed)
Patient returned from xray.

## 2017-03-02 NOTE — ED Notes (Signed)
2 sets of blood cultures sent down to [facility]. Verified that [facility] received them

## 2017-03-02 NOTE — Discharge Instructions (Signed)
Drink plenty of fluids.   Take Tylenol for fever. Follow-up with her family doctor next week

## 2017-03-02 NOTE — ED Notes (Signed)
Patient transported to X-ray 

## 2017-03-02 NOTE — ED Notes (Signed)
istat lactic acid already collected,  cnc istat lactic acid @2356 

## 2017-03-02 NOTE — ED Triage Notes (Signed)
Pt c/o fever, chills, shortness of breath, HA, diarrhea last night. Pt states she did not have any tylenol at home.
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
## 2017-03-03 NOTE — ED Notes (Signed)
Pt verbalized understanding of d/c instructions and has no further questions. Pt is stable, A&Ox4, VSS.  

## 2017-03-04 LAB — URINE CULTURE

## 2017-03-07 LAB — CULTURE, BLOOD (ROUTINE X 2)
Culture: NO GROWTH
Culture: NO GROWTH
Special Requests: ADEQUATE
Special Requests: ADEQUATE

## 2017-03-13 MED FILL — TRUEplus LANCETS 28G MISC: 25 days supply | Qty: 100 | Fill #1

## 2017-03-13 MED FILL — GABAPENTIN 600 MG TABLET: 600 | 30 days supply | Qty: 90 | Fill #1

## 2017-03-13 MED FILL — ATORVASTATIN 40 MG TABLET: 40 | 30 days supply | Qty: 30 | Fill #1

## 2017-03-13 MED FILL — ESCITALOPRAM 20 MG TABLET: 20 | 30 days supply | Qty: 30 | Fill #0

## 2017-03-13 MED FILL — PROAIR HFA 90 MCG INHALER: 108 (90 BAS | 25 days supply | Qty: 9 | Fill #1

## 2017-03-13 MED FILL — LISINOPRIL-HCTZ 10-12.5 MG: 10-12.5 | 30 days supply | Qty: 60 | Fill #0

## 2017-03-13 MED FILL — TRUE METRIX TEST STRIP: 25 days supply | Qty: 100 | Fill #1

## 2017-03-13 MED FILL — $HUMULIN R 100 UNITS/ML VIA: 100 | 50 days supply | Qty: 30 | Fill #0

## 2017-03-13 MED FILL — METFORMIN HCL ER 500 MG TAB: 500 | 30 days supply | Qty: 60 | Fill #0

## 2017-03-14 MED FILL — $LANTUS SOLOSTAR 100 UNITS/: 100 | 30 days supply | Qty: 30 | Fill #0

## 2017-03-16 ENCOUNTER — Ambulatory Visit: Payer: Self-pay | Attending: Internal Medicine | Admitting: Family Medicine

## 2017-03-16 VITALS — BP 125/81 | HR 112 | Temp 98.0°F | Resp 16 | Wt 189.8 lb

## 2017-03-16 DIAGNOSIS — J310 Chronic rhinitis: Secondary | ICD-10-CM | POA: Insufficient documentation

## 2017-03-16 DIAGNOSIS — F17210 Nicotine dependence, cigarettes, uncomplicated: Secondary | ICD-10-CM | POA: Insufficient documentation

## 2017-03-16 DIAGNOSIS — N3000 Acute cystitis without hematuria: Secondary | ICD-10-CM

## 2017-03-16 DIAGNOSIS — N12 Tubulo-interstitial nephritis, not specified as acute or chronic: Secondary | ICD-10-CM | POA: Insufficient documentation

## 2017-03-16 DIAGNOSIS — Z794 Long term (current) use of insulin: Secondary | ICD-10-CM | POA: Insufficient documentation

## 2017-03-16 DIAGNOSIS — M545 Low back pain, unspecified: Secondary | ICD-10-CM

## 2017-03-16 DIAGNOSIS — J4531 Mild persistent asthma with (acute) exacerbation: Secondary | ICD-10-CM

## 2017-03-16 DIAGNOSIS — E1101 Type 2 diabetes mellitus with hyperosmolarity with coma: Secondary | ICD-10-CM

## 2017-03-16 DIAGNOSIS — E1100 Type 2 diabetes mellitus with hyperosmolarity without nonketotic hyperglycemic-hyperosmolar coma (NKHHC): Secondary | ICD-10-CM | POA: Insufficient documentation

## 2017-03-16 DIAGNOSIS — Z7982 Long term (current) use of aspirin: Secondary | ICD-10-CM | POA: Insufficient documentation

## 2017-03-16 DIAGNOSIS — J45909 Unspecified asthma, uncomplicated: Secondary | ICD-10-CM | POA: Insufficient documentation

## 2017-03-16 DIAGNOSIS — Z79899 Other long term (current) drug therapy: Secondary | ICD-10-CM | POA: Insufficient documentation

## 2017-03-16 DIAGNOSIS — R300 Dysuria: Secondary | ICD-10-CM | POA: Insufficient documentation

## 2017-03-16 LAB — POCT URINALYSIS DIPSTICK
Bilirubin, UA: NEGATIVE
Glucose, UA: 500
Ketones, UA: NEGATIVE
Nitrite, UA: NEGATIVE
Protein, UA: 100
Spec Grav, UA: 1.025 (ref 1.010–1.025)
Urobilinogen, UA: 0.2 E.U./dL
pH, UA: 5.5 (ref 5.0–8.0)

## 2017-03-16 LAB — GLUCOSE, POCT (MANUAL RESULT ENTRY)
POC Glucose: 544 mg/dl — AB (ref 70–99)
POC Glucose: 465 mg/dl — AB (ref 70–99)

## 2017-03-16 LAB — POCT GLYCOSYLATED HEMOGLOBIN (HGB A1C): Hemoglobin A1C: 13

## 2017-03-16 MED ORDER — INSULIN GLARGINE 100 UNIT/ML SOLOSTAR PEN: 50.0000 [IU] | PEN_INJECTOR | Freq: Every day | SUBCUTANEOUS | 3 refills | Status: DC

## 2017-03-16 MED ORDER — METFORMIN HCL ER 500 MG PO TB24: 500.0000 mg | ORAL_TABLET | Freq: Two times a day (BID) | ORAL | 2 refills | Status: DC

## 2017-03-16 MED ORDER — INSULIN ASPART 100 UNIT/ML ~~LOC~~ SOLN: 20.0000 [IU] | Freq: Once | SUBCUTANEOUS | Status: DC

## 2017-03-16 MED ORDER — ALBUTEROL SULFATE HFA 108 (90 BASE) MCG/ACT IN AERS: 2.0000 | INHALATION_SPRAY | Freq: Four times a day (QID) | RESPIRATORY_TRACT | 0 refills | Status: DC | PRN

## 2017-03-16 MED ORDER — FLUTICASONE PROPIONATE 50 MCG/ACT NA SUSP: 2.0000 | Freq: Two times a day (BID) | NASAL | 6 refills | Status: DC | PRN

## 2017-03-16 MED ORDER — FLUCONAZOLE 150 MG PO TABS: 150.0000 mg | ORAL_TABLET | Freq: Once | ORAL | 0 refills | Status: AC

## 2017-03-16 MED ORDER — INSULIN REGULAR HUMAN 100 UNIT/ML IJ SOLN: 10.0000 [IU] | Freq: Three times a day (TID) | INTRAMUSCULAR | 11 refills | Status: DC

## 2017-03-16 MED ORDER — IBUPROFEN 600 MG PO TABS: 600.0000 mg | ORAL_TABLET | Freq: Three times a day (TID) | ORAL | 0 refills | Status: DC | PRN

## 2017-03-16 MED ORDER — LEVOFLOXACIN 750 MG PO TABS: 750.0000 mg | ORAL_TABLET | Freq: Every day | ORAL | 0 refills | Status: DC

## 2017-03-16 MED FILL — IBUPROFEN 600 MG TABLET: 600 | 10 days supply | Qty: 30 | Fill #0

## 2017-03-16 MED FILL — FLUTICASONE PROP 50 MCG SPR: 50 | 30 days supply | Qty: 16 | Fill #0

## 2017-03-16 MED FILL — FLUCONAZOLE 150 MG TABLET: 150 | 1 days supply | Qty: 1 | Fill #0

## 2017-03-16 MED FILL — ?LEVOFLOXACIN 750 MG TABLET: 750 | 5 days supply | Qty: 5 | Fill #0

## 2017-03-16 NOTE — Patient Instructions (Signed)
Urinary Tract Infection, Adult A urinary tract infection (UTI) is an infection of any part of the urinary tract. The urinary tract includes the:  Kidneys.  Ureters.  Bladder.  Urethra.  These organs make, store, and get rid of pee (urine) in the body. Follow these instructions at home:  Take over-the-counter and prescription medicines only as told by your doctor.  If you were prescribed an antibiotic medicine, take it as told by your doctor. Do not stop taking the antibiotic even if you start to feel better.  Avoid the following drinks: ? Alcohol. ? Caffeine. ? Tea. ? Carbonated drinks.  Drink enough fluid to keep your pee clear or pale yellow.  Keep all follow-up visits as told by your doctor. This is important.  Make sure to: ? Empty your bladder often and completely. Do not to hold pee for long periods of time. ? Empty your bladder before and after sex. ? Wipe from front to back after a bowel movement if you are female. Use each tissue one time when you wipe. Contact a doctor if:  You have back pain.  You have a fever.  You feel sick to your stomach (nauseous).  You throw up (vomit).  Your symptoms do not get better after 3 days.  Your symptoms go away and then come back. Get help right away if:  You have very bad back pain.  You have very bad lower belly (abdominal) pain.  You are throwing up and cannot keep down any medicines or water. This information is not intended to replace advice given to you by your health care provider. Make sure you discuss any questions you have with your health care provider. Document Released: 01/31/2008 Document Revised: 01/20/2016 Document Reviewed: 07/05/2015 Elsevier Interactive Patient Education  2018 [employer].   Asthma, Adult Asthma is a condition of the lungs in which the airways tighten and narrow. Asthma can make it hard to breathe. Asthma cannot be cured, but medicine and lifestyle changes can help control it.  Asthma may be started (triggered) by:  Animal skin flakes (dander).  Dust.  Cockroaches.  Pollen.  Mold.  Smoke.  Cleaning products.  Hair sprays or aerosol sprays.  Paint fumes or strong smells.  Cold air, weather changes, and winds.  Crying or laughing hard.  Stress.  Certain medicines or drugs.  Foods, such as dried fruit, potato chips, and sparkling grape juice.  Infections or conditions (colds, flu).  Exercise.  Certain medical conditions or diseases.  Exercise or tiring activities.  Follow these instructions at home:  Take medicine as told by your doctor.  Use a peak flow meter as told by your doctor. A peak flow meter is a tool that measures how well the lungs are working.  Record and keep track of the peak flow meter's readings.  Understand and use the asthma action plan. An asthma action plan is a written plan for taking care of your asthma and treating your attacks.  To help prevent asthma attacks: ? Do not smoke. Stay away from secondhand smoke. ? Change your heating and air conditioning filter often. ? Limit your use of fireplaces and wood stoves. ? Get rid of pests (such as roaches and mice) and their droppings. ? Throw away plants if you see mold on them. ? Clean your floors. Dust regularly. Use cleaning products that do not smell. ? Have someone vacuum when you are not home. Use a vacuum cleaner with a HEPA filter if possible. ? Replace carpet  with wood, tile, or vinyl flooring. Carpet can trap animal skin flakes and dust. ? Use allergy-proof pillows, mattress covers, and box spring covers. ? Wash bed sheets and blankets every week in hot water and dry them in a dryer. ? Use blankets that are made of polyester or cotton. ? Clean bathrooms and kitchens with bleach. If possible, have someone repaint the walls in these rooms with mold-resistant paint. Keep out of the rooms that are being cleaned and painted. ? Wash hands often. Contact a  doctor if:  You have make a whistling sound when breaking (wheeze), have shortness of breath, or have a cough even if taking medicine to prevent attacks.  The colored mucus you cough up (sputum) is thicker than usual.  The colored mucus you cough up changes from clear or white to yellow, green, gray, or bloody.  You have problems from the medicine you are taking such as: ? A rash. ? Itching. ? Swelling. ? Trouble breathing.  You need reliever medicines more than 2-3 times a week.  Your peak flow measurement is still at 50-79% of your personal best after following the action plan for 1 hour.  You have a fever. Get help right away if:  You seem to be worse and are not responding to medicine during an asthma attack.  You are short of breath even at rest.  You get short of breath when doing very little activity.  You have trouble eating, drinking, or talking.  You have chest pain.  You have a fast heartbeat.  Your lips or fingernails start to turn blue.  You are light-headed, dizzy, or faint.  Your peak flow is less than 50% of your personal best. This information is not intended to replace advice given to you by your health care provider. Make sure you discuss any questions you have with your health care provider. Document Released: 01/31/2008 Document Revised: 01/20/2016 Document Reviewed: 03/13/2013 Elsevier Interactive Patient Education  2017 [employer].

## 2017-03-16 NOTE — Progress Notes (Signed)
Subjective:  Patient ID: Barbara Wang, female    DOB: [birth year]  Age: 51 y.o. MRN: [id number]  CC: Dysuria   HPI Annisha H Oglesby presents for complaints of burning with urination She has had symptoms for 5 days. Patient also complains of back pain, cough and rhinitis. Patient denies fever and vaginal discharge. Patient does not have a history of recurrent UTI.  Patient does have a history of pyelonephritis last month. She reports completing course of antibiotics. History of diabetes. Symptoms: none. Patient denies foot ulcerations, nausea, paresthesia of the feet, polydipsia, polyuria, visual disturbances and vomitting.  Evaluation to date has been included: fasting blood sugar and hemoglobin A1C.  Home sugars: patient does not check sugars. She reports being without her diabetic medications for 4 days. Treatment to date: metformin, novolog, and lantus. History of asthma. Patient's symptoms include non-productive cough. Associated symptoms include bilateral ear congestion and rhinorrhea . She is a current smoker. She reports smoking 1/2 pack of cigarettes per day. She is not ready to quit at this time. The patient has been suffering from these symptoms for approximately 5 days. Symptoms have been unchanged since their onset. Medications used in the past to treat these symptoms include beta agonist inhalers. Patient has not required Emergency Room treatment for these symptoms, and has not required hospitalization recently. She also complaints of low back pain. Symptoms began 4days ago. It was not related to no known injury. The pain is rated moderate, and is located at the bilateral lumbar. The pain is described as aching and occurs intermittentlyShe denies symptoms have been progressive. Symptoms are exacerbated by movement. She denies taking anything for symptoms.    Outpatient Medications Prior to Visit  Medication Sig Dispense Refill  . aspirin 81 MG EC tablet Take 1 tablet (81 mg  total) by mouth daily. 90 tablet 3  . atorvastatin (LIPITOR) 40 MG tablet Take 1 tablet (40 mg total) by mouth daily at 6 PM. 90 tablet 3  . Blood Glucose Monitoring Suppl (TRUE METRIX GO GLUCOSE METER) w/Device KIT 1 each by Does not apply route every 8 (eight) hours as needed. 1 kit 0  . escitalopram (LEXAPRO) 20 MG tablet Take 1 tablet (20 mg total) by mouth at bedtime. take 1 tab daily 30 tablet 2  . fluticasone (FLOVENT HFA) 220 MCG/ACT inhaler Inhale 2 puffs into the lungs 2 (two) times daily as needed (shortness of breath/ wheezing).    . gabapentin (NEURONTIN) 600 MG tablet Take 1 tablet (600 mg total) by mouth 3 (three) times daily. 90 tablet 3  . glucose blood test strip Use as instructed 100 each 12  . guaiFENesin (MUCINEX) 600 MG 12 hr tablet Take 1 tablet (600 mg total) by mouth 2 (two) times daily. 60 tablet 0  . lisinopril-hydrochlorothiazide (PRINZIDE,ZESTORETIC) 10-12.5 MG tablet Take 2 tablets by mouth daily. 60 tablet 3  . loratadine (CLARITIN) 10 MG tablet Take 10 mg by mouth daily as needed for allergies or rhinitis.   3  . pantoprazole (PROTONIX) 40 MG tablet Take 1 tablet (40 mg total) by mouth daily. 30 tablet 0  . TRUEPLUS LANCETS 26G MISC 1 each by Does not apply route every 8 (eight) hours as needed. 100 each 12  . albuterol (PROAIR HFA) 108 (90 Base) MCG/ACT inhaler Inhale 2 puffs into the lungs every 6 (six) hours as needed for wheezing or shortness of breath. 1 Inhaler 11  . albuterol (PROVENTIL HFA;VENTOLIN HFA) 108 (90 Base) MCG/ACT inhaler Inhale 2  puffs into the lungs every 6 (six) hours as needed for wheezing or shortness of breath. 1 Inhaler 0  . albuterol (PROVENTIL) (2.5 MG/3ML) 0.083% nebulizer solution Take 2.5 mg by nebulization every 6 (six) hours as needed for wheezing or shortness of breath.    . fluticasone (FLONASE) 50 MCG/ACT nasal spray Place 2 sprays into both nostrils 2 (two) times daily as needed for allergies or rhinitis.    . Insulin Glargine  (LANTUS) 100 UNIT/ML Solostar Pen Inject 50 Units into the skin 2 (two) times daily. 90 mL 3  . insulin regular (HUMULIN R) 100 units/mL injection Inject 0.2 mLs (20 Units total) into the skin 3 (three) times daily before meals. (Patient taking differently: Inject 10-20 Units into the skin 3 (three) times daily before meals. Sliding scale) 40 mL 3  . metFORMIN (GLUCOPHAGE XR) 500 MG 24 hr tablet Take 1 tablet (500 mg total) by mouth 2 (two) times daily. 180 tablet 2  . hydrocortisone cream 0.5 % Apply 1 application topically 2 (two) times daily. 30 g 0  . nicotine (NICODERM CQ) 14 mg/24hr patch Place 1 patch (14 mg total) onto the skin daily. (Patient not taking: Reported on 10/03/2016) 28 patch 3  . ibuprofen (ADVIL,MOTRIN) 800 MG tablet Take 1 tablet (800 mg total) by mouth every 8 (eight) hours as needed for moderate pain. 15 tablet 0  . Insulin Detemir (LEVEMIR) 100 UNIT/ML Pen Inject 50 Units into the skin 2 (two) times daily. (Patient not taking: Reported on 03/02/2017) 40 mL 3  . levofloxacin (LEVAQUIN) 500 MG tablet Take 1 tablet (500 mg total) by mouth daily. (Patient not taking: Reported on 03/16/2017) 7 tablet 0   Facility-Administered Medications Prior to Visit  Medication Dose Route Frequency Provider Last Rate Last Dose  . insulin aspart (novoLOG) injection 10 Units  10 Units Subcutaneous Once Langeland, Leda Quail, MD        ROS Review of Systems  Constitutional: Negative.   HENT: Positive for rhinorrhea.        Ear congestion  Respiratory: Positive for cough.   Genitourinary: Positive for dysuria.  Musculoskeletal: Positive for back pain.   Objective:  BP 125/81 (BP Location: Left Arm, Patient Position: Sitting, Cuff Size: Normal)   Pulse (!) 112   Temp 98 F (36.7 C) (Oral)   Resp 16   Wt 189 lb 12.8 oz (86.1 kg)   SpO2 96%   BMI 32.58 kg/m   BP/Weight 03/16/2017 09/02/1094 0/11/5407  Systolic BP 811 914 -  Diastolic BP 81 64 -  Wt. (Lbs) 189.8 - 180  BMI 32.58 - 30.9      Physical Exam  Constitutional: She appears well-developed and well-nourished. She appears toxic.  HENT:  Head: Normocephalic and atraumatic.  Right Ear: External ear normal.  Left Ear: External ear normal.  Nose: Mucosal edema and rhinorrhea present.  Mouth/Throat: Uvula is midline and mucous membranes are normal. No oropharyngeal exudate.  Eyes: Pupils are equal, round, and reactive to light. Conjunctivae are normal.  Cardiovascular: Normal rate, regular rhythm, normal heart sounds and intact distal pulses.   Pulmonary/Chest: Effort normal and breath sounds normal. She has no wheezes.  Abdominal: Soft. Bowel sounds are normal.  Musculoskeletal:       Lumbar back: She exhibits pain.  Skin: Skin is warm and dry.  Nursing note and vitals reviewed.  Assessment & Plan:   Problem List Items Addressed This Visit      Respiratory   Asthma - Primary  Relevant Medications   levofloxacin (LEVAQUIN) 750 MG tablet   albuterol (PROVENTIL HFA;VENTOLIN HFA) 108 (90 Base) MCG/ACT inhaler   fluticasone (FLONASE) 50 MCG/ACT nasal spray   Other Relevant Orders   Respiratory virus panel     Endocrine   Type 2 diabetes mellitus with hyperosmolar nonketotic hyperglycemia (HCC)   Check CBG's TID and bring glucometer or blood glucose log to next office visit.   Follow up clinical pharmacist 2 weeks.   Follow up with PCP in 8 weeks.   Relevant Medications   insulin aspart (novoLOG) injection 20 Units   Insulin Glargine (LANTUS) 100 UNIT/ML Solostar Pen   metFORMIN (GLUCOPHAGE XR) 500 MG 24 hr tablet   insulin regular (HUMULIN R) 100 units/mL injection   Other Relevant Orders   Glucose (CBG) (Completed)   HgB A1c (Completed)   Glucose (CBG) (Completed)     Genitourinary   Urinary tract infection   Recent history of pyelonephritis.   Relevant Medications   levofloxacin (LEVAQUIN) 750 MG tablet   Other Relevant Orders   Urine Culture (Completed)    Other Visit Diagnoses    Dysuria        Relevant Orders   POCT urinalysis dipstick (Completed)   Acute bilateral low back pain without sciatica       Relevant Medications   ibuprofen (ADVIL,MOTRIN) 600 MG tablet      Meds ordered this encounter  Medications  . insulin aspart (novoLOG) injection 20 Units  . DISCONTD: ibuprofen (ADVIL,MOTRIN) 600 MG tablet    Sig: Take 1 tablet (600 mg total) by mouth every 8 (eight) hours as needed for moderate pain (Take with food.).    Dispense:  30 tablet    Refill:  0    Order Specific Question:   Supervising Provider    Answer:   Tresa Garter [id number]  . Insulin Glargine (LANTUS) 100 UNIT/ML Solostar Pen    Sig: Inject 50 Units into the skin at bedtime.    Dispense:  90 mL    Refill:  3    Order Specific Question:   Supervising Provider    Answer:   Tresa Garter [id number]  . metFORMIN (GLUCOPHAGE XR) 500 MG 24 hr tablet    Sig: Take 1 tablet (500 mg total) by mouth 2 (two) times daily with a meal.    Dispense:  60 tablet    Refill:  2    Order Specific Question:   Supervising Provider    Answer:   Tresa Garter [id number]  . fluconazole (DIFLUCAN) 150 MG tablet    Sig: Take 1 tablet (150 mg total) by mouth once.    Dispense:  1 tablet    Refill:  0    Order Specific Question:   Supervising Provider    Answer:   Tresa Garter [id number]  . levofloxacin (LEVAQUIN) 750 MG tablet    Sig: Take 1 tablet (750 mg total) by mouth daily.    Dispense:  5 tablet    Refill:  0    Order Specific Question:   Supervising Provider    Answer:   Tresa Garter [id number]  . albuterol (PROVENTIL HFA;VENTOLIN HFA) 108 (90 Base) MCG/ACT inhaler    Sig: Inhale 2 puffs into the lungs every 6 (six) hours as needed for wheezing or shortness of breath.    Dispense:  1 Inhaler    Refill:  0    Order Specific Question:   Supervising Provider    Answer:  JEGEDE, OLUGBEMIGA E [id number]  . fluticasone (FLONASE) 50 MCG/ACT nasal spray    Sig: Place 2 sprays into  both nostrils 2 (two) times daily as needed for allergies or rhinitis.    Dispense:  16 g    Refill:  6    Order Specific Question:   Supervising Provider    Answer:   Tresa Garter [id number]  . insulin regular (HUMULIN R) 100 units/mL injection    Sig: Inject 0.1-0.2 mLs (10-20 Units total) into the skin 3 (three) times daily before meals. Sliding scale    Dispense:  10 mL    Refill:  11    Order Specific Question:   Supervising Provider    Answer:   Tresa Garter [id number]  . ibuprofen (ADVIL,MOTRIN) 600 MG tablet    Sig: Take 1 tablet (600 mg total) by mouth every 8 (eight) hours as needed for moderate pain (Take with food.).    Dispense:  30 tablet    Refill:  0    Order Specific Question:   Supervising Provider    Answer:   Tresa Garter [[id number]]    Follow-up: Return in about 2 weeks (around 03/30/2017) for DM check with [name].   Alfonse Spruce FNP

## 2017-03-16 NOTE — Progress Notes (Signed)
Patient Triage Assessment Form Todays Date: 03/16/2017 Name: Barbara Wang DOB: 03/25/1966 Reason for walkin: possible pneumonia When did your symptoms start: 706/2018 Please list symptoms: felt feverish, body aches, back ache and dysuria cough Are you having pain: yes  Assessment: pt arrived to [facility], c/o back pain and dysuria. Completed ATB: Levaquin. Feels like she has UTI today.  Vital Signs:  T: 98.0 P: 112  R: 16  SpO2: 96% RA  BP: 125/81 CBG: 544 Urinalysis  Plan:  Work-in with provider

## 2017-03-20 LAB — SPECIMEN STATUS REPORT

## 2017-03-21 ENCOUNTER — Telehealth: Payer: Self-pay

## 2017-03-21 LAB — RESPIRATORY VIRUS PANEL
Adenovirus: NEGATIVE
Influenza A: NEGATIVE
Influenza B: NEGATIVE
Metapneumovirus: NEGATIVE
Parainfluenza 1: NEGATIVE
Parainfluenza 2: NEGATIVE
Parainfluenza 3: NEGATIVE
Respiratory Syncytial Virus A: NEGATIVE
Respiratory Syncytial Virus B: NEGATIVE
Rhinovirus: NEGATIVE

## 2017-03-21 NOTE — Telephone Encounter (Signed)
-----   Message from Alfonse Spruce, [place] sent at 03/20/2017  3:13 PM EDT ----- Respiratory virus panel that evaluates for most common respiratory viruses is normal. Urine culture results still pending

## 2017-03-21 NOTE — Telephone Encounter (Signed)
CMA call regarding results  Patient did not answer but left a VM stating the reason of the call & to call back  if have any questions

## 2017-03-22 LAB — URINE CULTURE

## 2017-03-26 ENCOUNTER — Ambulatory Visit (INDEPENDENT_AMBULATORY_CARE_PROVIDER_SITE_OTHER): Payer: Self-pay | Admitting: Neurology

## 2017-03-26 ENCOUNTER — Telehealth: Payer: Self-pay

## 2017-03-26 ENCOUNTER — Other Ambulatory Visit: Payer: Self-pay

## 2017-03-26 ENCOUNTER — Encounter: Payer: Self-pay | Admitting: Neurology

## 2017-03-26 VITALS — BP 118/72 | HR 84 | Ht 64.0 in | Wt 185.0 lb

## 2017-03-26 DIAGNOSIS — D497 Neoplasm of unspecified behavior of endocrine glands and other parts of nervous system: Secondary | ICD-10-CM

## 2017-03-26 DIAGNOSIS — R519 Headache, unspecified: Secondary | ICD-10-CM

## 2017-03-26 DIAGNOSIS — F17200 Nicotine dependence, unspecified, uncomplicated: Secondary | ICD-10-CM

## 2017-03-26 DIAGNOSIS — R51 Headache: Principal | ICD-10-CM

## 2017-03-26 MED ORDER — TOPIRAMATE 50 MG PO TABS: 50.0000 mg | ORAL_TABLET | Freq: Every day | ORAL | 2 refills | Status: DC

## 2017-03-26 MED ORDER — TIZANIDINE HCL 4 MG PO TABS: 4.0000 mg | ORAL_TABLET | Freq: Every evening | ORAL | 0 refills | Status: DC | PRN

## 2017-03-26 NOTE — Addendum Note (Signed)
Addended byAnnamaria Helling on: 03/26/2017 10:02 AM   Modules accepted: Orders

## 2017-03-26 NOTE — Telephone Encounter (Signed)
-----   Message from Alfonse Spruce, [place] sent at 03/26/2017 10:26 AM EDT ----- Normal flora, no harmful bacterial growth.

## 2017-03-26 NOTE — Telephone Encounter (Signed)
CMA call regarding lab results  Patient did not answer & VM was full to leave message

## 2017-03-26 NOTE — Addendum Note (Signed)
Addended by: Clois Comber on: 03/26/2017 09:46 AM   Modules accepted: Orders

## 2017-03-26 NOTE — Progress Notes (Addendum)
NEUROLOGY CONSULTATION NOTE  Barbara Wang MRN: [id number] DOB: 1965/09/16  Referring provider: Dr. Janne Napoleon Primary care provider: Dr. Janne Napoleon  Reason for consult:  Headache, abnormal MRI of brain  HISTORY OF PRESENT ILLNESS: Barbara Wang is a 51 year old right-handed female with hypertension, asthma, type 2 diabetes with neuropathy, and hyperlipidemia who presents for headache and abnormal brain MRI.  MRI report reviewed. Onset:  Unsure.  Maybe 1 to 2 years ago.  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   Location:  Occipital into the neck (spinal) Quality:  pounding Intensity:  10/10 Aura:  no Prodrome:  no Postdrome:  no Associated symptoms:  photophobia.  She has not had any new worse headache of her life, waking up from sleep Duration:  All day Frequency:  2 days a week Frequency of abortive medication: daily Triggers/exacerbating factors:  Light, neck movement Relieving factors:  Laying down, tylenol or ibuprofen Activity:  aggravates  Past NSAIDS:  no Past analgesics:  no Past abortive triptans:  no Past muscle relaxants:  no Past anti-emetic:  no Past antihypertensive medications:  no Past antidepressant medications:  no Past anticonvulsant medications:  no Past vitamins/Herbal/Supplements:  no Other past therapies:  no  Current NSAIDS:  ASA 43m, ibuprofen Current analgesics:  Tylenol Current triptans:  no Current anti-emetic:  no Current muscle relaxants:  no Current anti-anxiolytic:  no Current sleep aide:  no Current Antihypertensive medications:  Lisinopril-HCTZ Current Antidepressant medications:  escitalopram 234mCurrent Anticonvulsant medications:  gabapentin 60049mhree times daily Current Vitamins/Herbal/Supplements:  no Current Antihistamines/Decongestants:  no Other therapy:  no  MRI of brain without contrast performed at an outside facility on 04/07/16 revealed a noncalcified 2 cm x 1.8  cm x 2.5 cm extra-axial suprasellar mass with no hemorrhage or splaying of the dorsum sella, 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  CT of cervical spine from 07/01/16 was personally reviewed and revealed no obvious stenosis.  03/02/17 LABS:  CBC with WBC 12.7, HGB 15, HCT 44, PLT 270; CMP with Na 134, K 3.8, Cl 99, CO2 25, glucose 221, BUN 17, Cr 1.24, total bili 0.7, ALP 102, AST 34, ALT 26.  PAST MEDICAL HISTORY: Past Medical History:  Diagnosis Date  . Asthma   . Cellulitis 06/2015   rt hand   . Depression   . History of hiatal hernia    "it went away on it's own"  . Hyperlipemia   . Hypertension   . Neuropathy     "feet & hands "  . Obesity   . Pancreatitis   . Type II diabetes mellitus (HCC)    insulin dependent     PAST SURGICAL HISTORY: Past Surgical History:  Procedure Laterality Date  . ABDOMINAL HYSTERECTOMY    . [id number]  . CHOLECYSTECTOMY  05/11/2012   Procedure: LAPAROSCOPIC CHOLECYSTECTOMY WITH INTRAOPERATIVE CHOLANGIOGRAM;  Surgeon: EriGayland CurryD,FACS;  [facility];  Laterality: N/A;  . SHOULDER ARTHROSCOPY W/ ROTATOR CUFF REPAIR Right   . TUBAL LIGATION  10/25/1999   /noArchie Endo16/2012    MEDICATIONS: Current Outpatient Prescriptions on File Prior to Visit  Medication Sig Dispense Refill  . albuterol (PROVENTIL HFA;VENTOLIN HFA) 108 (90 Base) MCG/ACT inhaler Inhale 2 puffs into the lungs every 6 (six) hours as needed for wheezing or shortness of breath. 1 Inhaler 0  . aspirin 81 MG EC tablet Take 1  tablet (81 mg total) by mouth daily. 90 tablet 3  . atorvastatin (LIPITOR) 40 MG tablet Take 1 tablet (40 mg total) by mouth daily at 6 PM. 90 tablet 3  . escitalopram (LEXAPRO) 20 MG tablet Take 1 tablet (20 mg total) by mouth at bedtime. take 1 tab daily 30 tablet 2  . fluticasone (FLONASE) 50 MCG/ACT nasal spray Place 2 sprays into both nostrils 2 (two) times  daily as needed for allergies or rhinitis. 16 g 6  . fluticasone (FLOVENT HFA) 220 MCG/ACT inhaler Inhale 2 puffs into the lungs 2 (two) times daily as needed (shortness of breath/ wheezing).    . gabapentin (NEURONTIN) 600 MG tablet Take 1 tablet (600 mg total) by mouth 3 (three) times daily. 90 tablet 3  . guaiFENesin (MUCINEX) 600 MG 12 hr tablet Take 1 tablet (600 mg total) by mouth 2 (two) times daily. 60 tablet 0  . hydrocortisone cream 0.5 % Apply 1 application topically 2 (two) times daily. 30 g 0  . ibuprofen (ADVIL,MOTRIN) 600 MG tablet Take 1 tablet (600 mg total) by mouth every 8 (eight) hours as needed for moderate pain (Take with food.). 30 tablet 0  . Insulin Glargine (LANTUS) 100 UNIT/ML Solostar Pen Inject 50 Units into the skin at bedtime. 90 mL 3  . insulin regular (HUMULIN R) 100 units/mL injection Inject 0.1-0.2 mLs (10-20 Units total) into the skin 3 (three) times daily before meals. Sliding scale 10 mL 11  . lisinopril-hydrochlorothiazide (PRINZIDE,ZESTORETIC) 10-12.5 MG tablet Take 2 tablets by mouth daily. 60 tablet 3  . loratadine (CLARITIN) 10 MG tablet Take 10 mg by mouth daily as needed for allergies or rhinitis.   3  . metFORMIN (GLUCOPHAGE XR) 500 MG 24 hr tablet Take 1 tablet (500 mg total) by mouth 2 (two) times daily with a meal. 60 tablet 2  . pantoprazole (PROTONIX) 40 MG tablet Take 1 tablet (40 mg total) by mouth daily. 30 tablet 0  . Blood Glucose Monitoring Suppl (TRUE METRIX GO GLUCOSE METER) w/Device KIT 1 each by Does not apply route every 8 (eight) hours as needed. (Patient not taking: Reported on 03/26/2017) 1 kit 0  . glucose blood test strip Use as instructed (Patient not taking: Reported on 03/26/2017) 100 each 12  . TRUEPLUS LANCETS 26G MISC 1 each by Does not apply route every 8 (eight) hours as needed. (Patient not taking: Reported on 03/26/2017) 100 each 12   Current Facility-Administered Medications on File Prior to Visit  Medication Dose Route  Frequency Provider Last Rate Last Dose  . insulin aspart (novoLOG) injection 10 Units  10 Units Subcutaneous Once Langeland, Dawn T, MD      . insulin aspart (novoLOG) injection 20 Units  20 Units Subcutaneous Once Hairston, Mandesia R, FNP        ALLERGIES: Allergies  Allergen Reactions  . Penicillins Hives and Shortness Of Breath    Has patient had a PCN reaction causing immediate rash, facial/tongue/throat swelling, SOB or lightheadedness with hypotension: Yes Has patient had a PCN reaction causing severe rash involving mucus membranes or skin necrosis: No Has patient had a PCN reaction that required hospitalization No Has patient had a PCN reaction occurring within the last 10 years: No If all of the above answers are "NO", then may proceed with Cephalosporin use.  . Azithromycin Hives  . Sulfa Antibiotics Hives  . Tramadol Hives    FAMILY HISTORY: Family History  Problem Relation Age of Onset  . Stroke Mother   .   Diabetes Father   . Diabetes Sister     SOCIAL HISTORY: Social History   Social History  . Marital status: Single    Spouse name: N/A  . Number of children: N/A  . Years of education: N/A   Occupational History  . Not on file.   Social History Main Topics  . Smoking status: Current Every Day Smoker    Packs/day: 1.00    Years: 40.00    Types: Cigarettes  . Smokeless tobacco: Never Used  . Alcohol use No  . Drug use: No  . Sexual activity: Not Currently    Birth control/ protection: Surgical   Other Topics Concern  . Not on file   Social History Narrative  . No narrative on file    REVIEW OF SYSTEMS: Constitutional: No fevers, chills, or sweats, no generalized fatigue, change in appetite Eyes: No visual changes, double vision, eye pain Ear, nose and throat: No hearing loss, ear pain, nasal congestion, sore throat Cardiovascular: No chest pain, palpitations Respiratory:  No shortness of breath at rest or with exertion,  wheezes GastrointestinaI: sometimes incontinence. Genitourinary:  Sometimes incontinence. Musculoskeletal:  Neck pain Integumentary: No rash, pruritus, skin lesions Neurological: as above Psychiatric: No depression, insomnia, anxiety Endocrine: No palpitations, fatigue, diaphoresis, mood swings, change in appetite, change in weight, increased thirst Hematologic/Lymphatic:  No purpura, petechiae. Allergic/Immunologic: no itchy/runny eyes, nasal congestion, recent allergic reactions, rashes  PHYSICAL EXAM: Vitals:   03/26/17 0818  BP: 118/72  Pulse: 84   General: No acute distress.  Patient appears well-groomed.   Head:  Normocephalic/atraumatic Eyes:  fundi examined but not visualized Neck: supple, bilateral paraspinal tenderness, full range of motion Back: No paraspinal tenderness Heart: regular rate and rhythm Lungs: Clear to auscultation bilaterally. Vascular: No carotid bruits. Neurological Exam: Mental status: alert and oriented to person, place, and time, recent and remote memory intact, fund of knowledge intact, attention and concentration intact, speech fluent and not dysarthric, language intact. Cranial nerves: CN I: not tested CN II: pupils equal, round and reactive to light, bi-temporal vision loss CN III, IV, VI:  full range of motion, no nystagmus, no ptosis CN V: facial sensation intact CN VII: upper and lower face symmetric CN VIII: hearing intact CN IX, X: gag intact, uvula midline CN XI: sternocleidomastoid and trapezius muscles intact CN XII: tongue midline Bulk & Tone: normal, no fasciculations. Motor:  5/5 throughout  Sensation:  Pinprick and vibration sensation intact. Deep Tendon Reflexes:  2+ throughout, toes downgoing.  Finger to nose testing:  Without dysmetria.  Heel to shin:  Without dysmetria.  Gait:  Normal station and stride.  Able to turn, unsteady tandem walk. Romberg negative.  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 Cigarette smoker  PLAN: 1.  Repeat MRI of brain and pituitary with and without contrast  2.  Topiramate 46m at bedtime.  Recheck BMP in 4 weeks.  Side effects discussed.  Tizanidine 491mat bedtime as needed. 3.  3.  Check prolactin, growth hormone, LH/FSH, ACTH, TSH. Your provider has requested that you have labwork completed today. [id number] on the second floor of this building before leaving the office today. You do not need to check in. If you are not called within 15 minutes please check with the front desk.  4.  Refer to neurosurgery, opthalmology and endocrinology. We will send referrals and their offices will call you directly with  an appt.  5.  Limit use of Tylenol and ibuprofen to no more than 2 days out of the week. 6.  Will need referrals to neurosurgery (after repeat MRI performed), endocrinology and ophthalmology 7.  Follow up in 3 months. ADDENDUM:  Smoking cessation  Thank you for allowing me to take part in the care of this patient.   , DO  CC:  Dawn Langeland, MD     

## 2017-03-26 NOTE — Patient Instructions (Addendum)
1.  We will start topiramate (Topamax) 50mg  tablet at bedtime.  Contact me in 4 weeks with update and we can increase dose if needed. Possible side effects include: impaired thinking, sedation, paresthesias (numbness and tingling) and weight loss.  It may cause dehydration and there is a small risk for kidney stones, so make sure to stay hydrated with water during the day.  There is also a very small risk for glaucoma, so if you notice any change in your vision while taking this medication, see an ophthalmologist.   2.  Limit Tylenol and ibuprofen to no more than 2 days out of the week 3.  Check prolactin, growth hormone, LH/FSH, ACTH, TSH. Your provider has requested that you have labwork completed today. Please go to [facility] ([id number]) on the second floor of this building before leaving the office today. You do not need to check in. If you are not called within 15 minutes please check with the front desk.  4.  Refer to neurosurgery, opthalmology and endocrinology. We will send referrals and their offices will call you directly with an appt.  5.  Follow up in 3 months. 6.  Repeat BMP in one month. 7.  Take tizanidine 4mg  at bedtime as needed for neck pain.

## 2017-03-27 LAB — FSH/LH
FSH: 55.7 m[IU]/mL
LH: 32.7 m[IU]/mL

## 2017-03-27 LAB — TSH: TSH: 0.49 mIU/L

## 2017-03-27 LAB — PROLACTIN: Prolactin: 7.6 ng/mL

## 2017-03-28 LAB — ACTH: C206 ACTH: 16 pg/mL (ref 6–50)

## 2017-03-29 ENCOUNTER — Telehealth: Payer: Self-pay

## 2017-03-29 LAB — GROWTH HORMONE: Growth Hormone: 0.3 ng/mL (ref <=7.1)

## 2017-03-29 NOTE — Telephone Encounter (Signed)
Attempted to reach Pt with lab results. VM is full, unable to leave a message.

## 2017-03-29 NOTE — Telephone Encounter (Signed)
-----   Message from Cameron Sprang, MD sent at 03/29/2017 11:49 AM EDT ----- Pls let her know bloodwork is normal with the hormone levels checked. Thanks

## 2017-03-30 NOTE — Telephone Encounter (Signed)
-----   Message from Cameron Sprang, MD sent at 03/29/2017 11:49 AM EDT ----- Pls let her know bloodwork is normal with the hormone levels checked. Thanks

## 2017-03-30 NOTE — Telephone Encounter (Signed)
Spoke with Pt, advsd her hormone levels are normal. Pt questioning urine test results, states still has has UTI. Advsd Pt will check results, Pt unsure when that was done. Will call Pt back.--viewed recent labs, U/A was from PCP, called Pt advsd her to call PCP for results.

## 2017-04-04 ENCOUNTER — Ambulatory Visit: Payer: Self-pay | Attending: Internal Medicine | Admitting: Family Medicine

## 2017-04-04 VITALS — BP 132/79 | HR 101 | Temp 97.7°F | Resp 18 | Ht 64.0 in | Wt 188.6 lb

## 2017-04-04 DIAGNOSIS — N12 Tubulo-interstitial nephritis, not specified as acute or chronic: Secondary | ICD-10-CM | POA: Insufficient documentation

## 2017-04-04 DIAGNOSIS — M549 Dorsalgia, unspecified: Secondary | ICD-10-CM

## 2017-04-04 DIAGNOSIS — Z794 Long term (current) use of insulin: Secondary | ICD-10-CM | POA: Insufficient documentation

## 2017-04-04 DIAGNOSIS — E119 Type 2 diabetes mellitus without complications: Secondary | ICD-10-CM | POA: Insufficient documentation

## 2017-04-04 DIAGNOSIS — N390 Urinary tract infection, site not specified: Secondary | ICD-10-CM

## 2017-04-04 DIAGNOSIS — R399 Unspecified symptoms and signs involving the genitourinary system: Secondary | ICD-10-CM

## 2017-04-04 DIAGNOSIS — R300 Dysuria: Secondary | ICD-10-CM | POA: Insufficient documentation

## 2017-04-04 DIAGNOSIS — Z87440 Personal history of urinary (tract) infections: Secondary | ICD-10-CM | POA: Insufficient documentation

## 2017-04-04 LAB — POCT URINALYSIS DIPSTICK
Bilirubin, UA: NEGATIVE
Glucose, UA: NEGATIVE
Ketones, UA: NEGATIVE
Nitrite, UA: NEGATIVE
Spec Grav, UA: 1.015 (ref 1.010–1.025)
Urobilinogen, UA: 0.2 E.U./dL
pH, UA: 5.5 (ref 5.0–8.0)

## 2017-04-04 LAB — GLUCOSE, POCT (MANUAL RESULT ENTRY)
POC Glucose: 72 mg/dL (ref 70–99)
POC Glucose: 74 mg/dl (ref 70–99)

## 2017-04-04 MED ORDER — NITROFURANTOIN MONOHYD MACRO 100 MG PO CAPS
100.0000 mg | ORAL_CAPSULE | Freq: Two times a day (BID) | ORAL | 0 refills | Status: DC
Start: 2017-04-04 — End: 2017-05-29

## 2017-04-04 MED FILL — NITROFURANTOIN MONO-MCR 100: 100 | 5 days supply | Qty: 10 | Fill #0

## 2017-04-04 NOTE — Progress Notes (Signed)
    S:     No chief complaint on file.   Patient arrives in good spirits.  Presents for diabetes evaluation, education, and management at the request of Fredia Beets, NP. Patient was referred on 03/16/17.  Patient was last seen by Primary Care Provider on 03/16/17.   Patient reports adherence with medications but does report forgetting a dose of metformin. Denies missing any insulin..  Current diabetes medications include: Lantus 50 units BID (not daily as prescribed), Insulin Regular 10-20 units sliding scale, and metformin 500 mg BID.  Patient denies hypoglycemic events. She does not check her blood sugars at home.   Patient reports nocturia and feels that she has a UTI, she has a burning sensation when urinating   She reports that she has been really tired recently and that being so tired has made her dizzy.   O:  Physical Exam   ROS   Lab Results  Component Value Date   HGBA1C 13.0 03/16/2017   There were no vitals filed for this visit.   POCT glucose = 74 (fasting)  Home fasting CBG: doesn't check  2 hour post-prandial/random CBG: doesn't check  A/P: Diabetes longstanding currently uncontrolled based on A1c of 13. Patient denies hypoglycemic events and is able to verbalize appropriate hypoglycemia management plan. Patient reports adherence with medication. Control is suboptimal due to sedentary lifestyle and dietary indiscretion.  Continue current medications as prescribed. Instructed patient to not take Lantus twice daily as she was prescribed it daily. I am worried she is getting too low (fatigue/dizziness) and not checking her blood sugars. Patient to start checking blood sugars and return in 2 weeks with meter/log. Can titrate insulin as needed as that time.  Patient to be seen by [facility] today for UTI symptoms and by [facility] for positive GAD-7.  Next A1C anticipated October 2018.    Written patient instructions provided.  Total time in face to face  counseling 15 minutes.   Follow up in Pharmacist Clinic Visit in 2 weeks.   Patient seen with Waverly Ferrari, PharmD Candidate

## 2017-04-04 NOTE — Patient Instructions (Addendum)
Thanks for coming to see me  Just take the Lantus once a day   Check your blood sugars every day for the next two weeks and come back and see me!  Apply for orange card.   Hypoglycemia Hypoglycemia is when the sugar (glucose) level in the blood is too low. Symptoms of low blood sugar may include:  Feeling: ? Hungry. ? Worried or nervous (anxious). ? Sweaty and clammy. ? Confused. ? Dizzy. ? Sleepy. ? Sick to your stomach (nauseous).  Having: ? A fast heartbeat. ? A headache. ? A change in your vision. ? Jerky movements that you cannot control (seizure). ? Nightmares. ? Tingling or no feeling (numbness) around the mouth, lips, or tongue.  Having trouble with: ? Talking. ? Paying attention (concentrating). ? Moving (coordination). ? Sleeping.  Shaking.  Passing out (fainting).  Getting upset easily (irritability).  Low blood sugar can happen to people who have diabetes and people who do not have diabetes. Low blood sugar can happen quickly, and it can be an emergency. Treating Low Blood Sugar Low blood sugar is often treated by eating or drinking something sugary right away. If you can think clearly and swallow safely, follow the 15:15 rule:  Take 15 grams of a fast-acting carb (carbohydrate). Some fast-acting carbs are: ? 1 tube of glucose gel. ? 3 sugar tablets (glucose pills). ? 6-8 pieces of hard candy. ? 4 oz (120 mL) of fruit juice. ? 4 oz (120 mL) of regular (not diet) soda.  Check your blood sugar 15 minutes after you take the carb.  If your blood sugar is still at or below 70 mg/dL (3.9 mmol/L), take 15 grams of a carb again.  If your blood sugar does not go above 70 mg/dL (3.9 mmol/L) after 3 tries, get help right away.  After your blood sugar goes back to normal, eat a meal or a snack within 1 hour.  Treating Very Low Blood Sugar If your blood sugar is at or below 54 mg/dL (3 mmol/L), you have very low blood sugar (severe hypoglycemia). This is an  emergency. Do not wait to see if the symptoms will go away. Get medical help right away. Call your local emergency services (911 in the [place] Do not drive yourself to the hospital. If you have very low blood sugar and you cannot eat or drink, you may need a glucagon shot (injection). A family member or friend should learn how to check your blood sugar and how to give you a glucagon shot. Ask your doctor if you need to have a glucagon shot kit at home. Follow these instructions at home: General instructions  Avoid any diets that cause you to not eat enough food. Talk with your doctor before you start any new diet.  Take over-the-counter and prescription medicines only as told by your doctor.  Limit alcohol to no more than 1 drink per day for nonpregnant women and 2 drinks per day for men. One drink equals 12 oz of beer, 5 oz of wine, or 1 oz of hard liquor.  Keep all follow-up visits as told by your doctor. This is important. If You Have Diabetes:   Make sure you know the symptoms of low blood sugar.  Always keep a source of sugar with you, such as: ? Sugar. ? Sugar tablets. ? Glucose gel. ? Fruit juice. ? Regular soda (not diet soda). ? Milk. ? Hard candy. ? Honey.  Take your medicines as told.  Follow your exercise  and meal plan. ? Eat on time. Do not skip meals. ? Follow your sick day plan when you cannot eat or drink normally. Make this plan ahead of time with your doctor.  Check your blood sugar as often as told by your doctor. Always check before and after exercise.  Share your diabetes care plan with: ? Your work or school. ? People you live with.  Check your pee (urine) for ketones: ? When you are sick. ? As told by your doctor.  Carry a card or wear jewelry that says you have diabetes. If You Have Low Blood Sugar From Other Causes:   Check your blood sugar as often as told by your doctor.  Follow instructions from your doctor about what you cannot eat or  drink. Contact a doctor if:  You have trouble keeping your blood sugar in your target range.  You have low blood sugar often. Get help right away if:  You still have symptoms after you eat or drink something sugary.  Your blood sugar is at or below 54 mg/dL (3 mmol/L).  You have jerky movements that you cannot control.  You pass out. These symptoms may be an emergency. Do not wait to see if the symptoms will go away. Get medical help right away. Call your local emergency services (911 in the [place] Do not drive yourself to the hospital. This information is not intended to replace advice given to you by your health care provider. Make sure you discuss any questions you have with your health care provider. Document Released: 11/08/2009 Document Revised: 01/20/2016 Document Reviewed: 09/17/2015 Elsevier Interactive Patient Education  2018 [employer].    Urinary Tract Infection, Adult A urinary tract infection (UTI) is an infection of any part of the urinary tract, which includes the kidneys, ureters, bladder, and urethra. These organs make, store, and get rid of urine in the body. UTI can be a bladder infection (cystitis) or kidney infection (pyelonephritis). What are the causes? This infection may be caused by fungi, viruses, or bacteria. Bacteria are the most common cause of UTIs. This condition can also be caused by repeated incomplete emptying of the bladder during urination. What increases the risk? This condition is more likely to develop if:  You ignore your need to urinate or hold urine for long periods of time.  You do not empty your bladder completely during urination.  You wipe back to front after urinating or having a bowel movement, if you are female.  You are uncircumcised, if you are female.  You are constipated.  You have a urinary catheter that stays in place (indwelling).  You have a weak defense (immune) system.  You have a medical condition that affects  your bowels, kidneys, or bladder.  You have diabetes.  You take antibiotic medicines frequently or for long periods of time, and the antibiotics no longer work well against certain types of infections (antibiotic resistance).  You take medicines that irritate your urinary tract.  You are exposed to chemicals that irritate your urinary tract.  You are female.  What are the signs or symptoms? Symptoms of this condition include:  Fever.  Frequent urination or passing small amounts of urine frequently.  Needing to urinate urgently.  Pain or burning with urination.  Urine that smells bad or unusual.  Cloudy urine.  Pain in the lower abdomen or back.  Trouble urinating.  Blood in the urine.  Vomiting or being less hungry than normal.  Diarrhea or abdominal pain.  Vaginal discharge, if you are female.  How is this diagnosed? This condition is diagnosed with a medical history and physical exam. You will also need to provide a urine sample to test your urine. Other tests may be done, including:  Blood tests.  Sexually transmitted disease (STD) testing.  If you have had more than one UTI, a cystoscopy or imaging studies may be done to determine the cause of the infections. How is this treated? Treatment for this condition often includes a combination of two or more of the following:  Antibiotic medicine.  Other medicines to treat less common causes of UTI.  Over-the-counter medicines to treat pain.  Drinking enough water to stay hydrated.  Follow these instructions at home:  Take over-the-counter and prescription medicines only as told by your health care provider.  If you were prescribed an antibiotic, take it as told by your health care provider. Do not stop taking the antibiotic even if you start to feel better.  Avoid alcohol, caffeine, tea, and carbonated beverages. They can irritate your bladder.  Drink enough fluid to keep your urine clear or pale  yellow.  Keep all follow-up visits as told by your health care provider. This is important.  Make sure to: ? Empty your bladder often and completely. Do not hold urine for long periods of time. ? Empty your bladder before and after sex. ? Wipe from front to back after a bowel movement if you are female. Use each tissue one time when you wipe. Contact a health care provider if:  You have back pain.  You have a fever.  You feel nauseous or vomit.  Your symptoms do not get better after 3 days.  Your symptoms go away and then return. Get help right away if:  You have severe back pain or lower abdominal pain.  You are vomiting and cannot keep down any medicines or water. This information is not intended to replace advice given to you by your health care provider. Make sure you discuss any questions you have with your health care provider. Document Released: 05/24/2005 Document Revised: 01/26/2016 Document Reviewed: 07/05/2015 Elsevier Interactive Patient Education  2017 [employer].

## 2017-04-04 NOTE — Progress Notes (Signed)
Subjective:  Patient ID: Barbara Wang, female    DOB: 12/30/1965  Age: 52 y.o. MRN: [id number]  CC: Dysuria   HPI Barbara Wang presents for possible uti.  Patient complains of burning with urination She has had symptoms for 1 month. Patient also complains of back pain. Patient denies any hematuria, fever, malodorous urine, and vaginal discharge. Patient does have a history of recurrent UTI. Was recently treated with levofloxacin, she reports completing course.  Patient does have a history of pyelonephritis. She reports taking a relatives pyridium to help relieve symptoms in the past. She reports using OTC AZO for symptoms with minimal relief.  Outpatient Medications Prior to Visit  Medication Sig Dispense Refill  . albuterol (PROVENTIL HFA;VENTOLIN HFA) 108 (90 Base) MCG/ACT inhaler Inhale 2 puffs into the lungs every 6 (six) hours as needed for wheezing or shortness of breath. 1 Inhaler 0  . aspirin 81 MG EC tablet Take 1 tablet (81 mg total) by mouth daily. 90 tablet 3  . atorvastatin (LIPITOR) 40 MG tablet Take 1 tablet (40 mg total) by mouth daily at 6 PM. 90 tablet 3  . Blood Glucose Monitoring Suppl (TRUE METRIX GO GLUCOSE METER) w/Device KIT 1 each by Does not apply route every 8 (eight) hours as needed. (Patient not taking: Reported on 03/26/2017) 1 kit 0  . escitalopram (LEXAPRO) 20 MG tablet Take 1 tablet (20 mg total) by mouth at bedtime. take 1 tab daily 30 tablet 2  . fluticasone (FLONASE) 50 MCG/ACT nasal spray Place 2 sprays into both nostrils 2 (two) times daily as needed for allergies or rhinitis. 16 g 6  . fluticasone (FLOVENT HFA) 220 MCG/ACT inhaler Inhale 2 puffs into the lungs 2 (two) times daily as needed (shortness of breath/ wheezing).    . gabapentin (NEURONTIN) 600 MG tablet Take 1 tablet (600 mg total) by mouth 3 (three) times daily. 90 tablet 3  . glucose blood test strip Use as instructed (Patient not taking: Reported on 03/26/2017) 100 each 12  .  guaiFENesin (MUCINEX) 600 MG 12 hr tablet Take 1 tablet (600 mg total) by mouth 2 (two) times daily. 60 tablet 0  . hydrocortisone cream 0.5 % Apply 1 application topically 2 (two) times daily. 30 g 0  . ibuprofen (ADVIL,MOTRIN) 600 MG tablet Take 1 tablet (600 mg total) by mouth every 8 (eight) hours as needed for moderate pain (Take with food.). 30 tablet 0  . Insulin Glargine (LANTUS) 100 UNIT/ML Solostar Pen Inject 50 Units into the skin at bedtime. 90 mL 3  . insulin regular (HUMULIN R) 100 units/mL injection Inject 0.1-0.2 mLs (10-20 Units total) into the skin 3 (three) times daily before meals. Sliding scale 10 mL 11  . lisinopril-hydrochlorothiazide (PRINZIDE,ZESTORETIC) 10-12.5 MG tablet Take 2 tablets by mouth daily. 60 tablet 3  . loratadine (CLARITIN) 10 MG tablet Take 10 mg by mouth daily as needed for allergies or rhinitis.   3  . metFORMIN (GLUCOPHAGE XR) 500 MG 24 hr tablet Take 1 tablet (500 mg total) by mouth 2 (two) times daily with a meal. 60 tablet 2  . pantoprazole (PROTONIX) 40 MG tablet Take 1 tablet (40 mg total) by mouth daily. 30 tablet 0  . tiZANidine (ZANAFLEX) 4 MG tablet Take 1 tablet (4 mg total) by mouth at bedtime as needed for muscle spasms. 30 tablet 0  . topiramate (TOPAMAX) 50 MG tablet Take 1 tablet (50 mg total) by mouth at bedtime. 30 tablet 2  .  TRUEPLUS LANCETS 26G MISC 1 each by Does not apply route every 8 (eight) hours as needed. (Patient not taking: Reported on 03/26/2017) 100 each 12   Facility-Administered Medications Prior to Visit  Medication Dose Route Frequency Provider Last Rate Last Dose  . insulin aspart (novoLOG) injection 10 Units  10 Units Subcutaneous Once Langeland, Dawn T, MD      . insulin aspart (novoLOG) injection 20 Units  20 Units Subcutaneous Once ,  R, FNP        ROS Review of Systems  Constitutional: Negative.   Respiratory: Negative.   Cardiovascular: Negative.   Gastrointestinal: Negative.   Genitourinary:  Positive for dysuria and pelvic pain.  Musculoskeletal: Positive for back pain.   Objective:  BP 132/79 (BP Location: Left Arm, Patient Position: Sitting, Cuff Size: Normal)   Pulse (!) 101   Temp 97.7 F (36.5 C) (Oral)   Resp 18   Ht 5' 4" (1.626 m)   Wt 188 lb 9.6 oz (85.5 kg)   SpO2 95%   BMI 32.37 kg/m   BP/Weight 04/04/2017 03/26/2017 03/16/2017  Systolic BP 132 118 125  Diastolic BP 79 72 81  Wt. (Lbs) 188.6 185 189.8  BMI 32.37 31.76 32.58   Physical Exam  Constitutional: She appears well-developed and well-nourished.  Neck: No JVD present.  Cardiovascular: Normal rate, regular rhythm, normal heart sounds and intact distal pulses.   Pulmonary/Chest: Effort normal and breath sounds normal.  Abdominal: Soft. Bowel sounds are normal. There is tenderness (suprapubic). There is CVA tenderness.  Skin: Skin is warm and dry.  Nursing note and vitals reviewed.  Assessment & Plan:   Problem List Items Addressed This Visit      Endocrine   Diabetes mellitus (HCC)   Relevant Orders   Glucose (CBG) (Completed)   Glucose (CBG) (Completed)    Other Visit Diagnoses    UTI symptoms    -  Primary   Relevant Medications   nitrofurantoin, macrocrystal-monohydrate, (MACROBID) 100 MG capsule   Other Relevant Orders   Urinalysis Dipstick (Completed)   DG Abd 1 View   Urine cytology ancillary only (Completed)   Urine Culture (Completed)   Frequent UTI       Relevant Medications   nitrofurantoin, macrocrystal-monohydrate, (MACROBID) 100 MG capsule   Other Relevant Orders   Urinalysis Dipstick (Completed)   DG Abd 1 View   Ambulatory referral to Urology   CVA tenderness       Relevant Orders   Urinalysis Dipstick (Completed)   DG Abd 1 View      Meds ordered this encounter  Medications  . nitrofurantoin, macrocrystal-monohydrate, (MACROBID) 100 MG capsule    Sig: Take 1 capsule (100 mg total) by mouth 2 (two) times daily.    Dispense:  10 capsule    Refill:  0    Order  Specific Question:   Supervising Provider    Answer:   JEGEDE, OLUGBEMIGA E [[id number]]    Follow-up: Return in about 2 weeks (around 04/18/2017), or if symptoms worsen or fail to improve, for DM with [name] .    R  FNP    

## 2017-04-05 LAB — URINE CYTOLOGY ANCILLARY ONLY
Chlamydia: NEGATIVE
Neisseria Gonorrhea: NEGATIVE
Trichomonas: NEGATIVE

## 2017-04-08 LAB — URINE CULTURE

## 2017-04-09 LAB — URINE CYTOLOGY ANCILLARY ONLY
Bacterial vaginitis: NEGATIVE
Candida vaginitis: POSITIVE — AB

## 2017-04-13 ENCOUNTER — Other Ambulatory Visit: Payer: Self-pay | Admitting: Family Medicine

## 2017-04-13 ENCOUNTER — Telehealth: Payer: Self-pay

## 2017-04-13 ENCOUNTER — Ambulatory Visit
Admission: RE | Admit: 2017-04-13 | Discharge: 2017-04-13 | Disposition: A | Discharge status: Home / Self Care | Payer: Self-pay | Source: Ambulatory Visit | Attending: Neurology | Admitting: Neurology

## 2017-04-13 DIAGNOSIS — R519 Headache, unspecified: Secondary | ICD-10-CM

## 2017-04-13 DIAGNOSIS — B373 Candidiasis of vulva and vagina: Secondary | ICD-10-CM

## 2017-04-13 DIAGNOSIS — B3731 Acute candidiasis of vulva and vagina: Secondary | ICD-10-CM

## 2017-04-13 DIAGNOSIS — R51 Headache: Principal | ICD-10-CM

## 2017-04-13 DIAGNOSIS — D497 Neoplasm of unspecified behavior of endocrine glands and other parts of nervous system: Secondary | ICD-10-CM

## 2017-04-13 MED ORDER — GADOBENATE DIMEGLUMINE 529 MG/ML IV SOLN
8.0000 mL | Freq: Once | INTRAVENOUS | Status: AC | PRN
  Administered 2017-04-13: 8 mL via INTRAVENOUS

## 2017-04-13 MED ORDER — FLUCONAZOLE 150 MG PO TABS: 150.0000 mg | ORAL_TABLET | Freq: Once | ORAL | 0 refills | Status: DC

## 2017-04-13 MED ORDER — FLUCONAZOLE 150 MG PO TABS: 150.0000 mg | ORAL_TABLET | Freq: Once | ORAL | 0 refills | Status: AC

## 2017-04-13 MED FILL — FLUCONAZOLE 150 MG TABLET: 150 | 1 days supply | Qty: 1 | Fill #0

## 2017-04-13 NOTE — Telephone Encounter (Signed)
-----   Message from Alfonse Spruce, [place] sent at 04/13/2017 12:28 PM EDT ----- Yeast was positive. You will be prescribed diflucan to treat. To reduce your risk of developing BV don't douche, don't use scented soap or sprays, and wear cotton undergarments. - Gonorrhea, Chlamydia, BV, and Trichomonas were all negative. Urine culture was negative for bacteria.

## 2017-04-13 NOTE — Telephone Encounter (Signed)
CMA call regarding lab results  Patient did not answer & unable to leave message  

## 2017-04-13 NOTE — Telephone Encounter (Signed)
-----   Message from Alfonse Spruce, [place] sent at 04/13/2017 12:28 PM EDT ----- Yeast was positive. You will be prescribed diflucan to treat. To reduce your risk of developing BV don't douche, don't use scented soap or sprays, and wear cotton undergarments. - Gonorrhea, Chlamydia, BV, and Trichomonas were all negative. Urine culture was negative for bacteria.

## 2017-04-13 NOTE — Telephone Encounter (Signed)
Patient return CMA call  Patient verify DOB   Patient was aware and understood  

## 2017-04-17 ENCOUNTER — Telehealth: Payer: Self-pay | Admitting: Family Medicine

## 2017-04-17 NOTE — Telephone Encounter (Signed)
Pt is calling a referral to [facility]ist Since the crown is loose and can't eat and she does not have any insurance or even the orange card of the [employer] financial asst. But according to her they will do they work for free. She is asking to please sent a referral there ASAP, please follow up

## 2017-04-17 NOTE — Telephone Encounter (Signed)
Pt is calling a referral to [facility]ist Since the crown is loose and can't eat and she does not have any insurance or even the orange card of the [employer] financial asst. But according to her they will do they work for free. She is asking to please sent a referral there ASAP, please advice?

## 2017-04-17 NOTE — Telephone Encounter (Signed)
Please ask patient for contact information of person to follow up with regarding referral to [facility].

## 2017-04-18 ENCOUNTER — Ambulatory Visit: Payer: Self-pay | Attending: Family Medicine | Admitting: Pharmacist

## 2017-04-18 ENCOUNTER — Other Ambulatory Visit: Payer: Self-pay | Admitting: Family Medicine

## 2017-04-18 DIAGNOSIS — E1100 Type 2 diabetes mellitus with hyperosmolarity without nonketotic hyperglycemic-hyperosmolar coma (NKHHC): Secondary | ICD-10-CM

## 2017-04-18 DIAGNOSIS — Z794 Long term (current) use of insulin: Secondary | ICD-10-CM | POA: Insufficient documentation

## 2017-04-18 DIAGNOSIS — M545 Low back pain, unspecified: Secondary | ICD-10-CM

## 2017-04-18 DIAGNOSIS — E119 Type 2 diabetes mellitus without complications: Secondary | ICD-10-CM | POA: Insufficient documentation

## 2017-04-18 DIAGNOSIS — E1101 Type 2 diabetes mellitus with hyperosmolarity with coma: Secondary | ICD-10-CM

## 2017-04-18 LAB — GLUCOSE, POCT (MANUAL RESULT ENTRY): POC Glucose: 256 mg/dl — AB (ref 70–99)

## 2017-04-18 MED ORDER — INSULIN GLARGINE 100 UNIT/ML SOLOSTAR PEN: 40.0000 [IU] | PEN_INJECTOR | Freq: Two times a day (BID) | SUBCUTANEOUS | 3 refills | Status: DC

## 2017-04-18 MED FILL — ATORVASTATIN 40 MG TABLET: 40 | 30 days supply | Qty: 30 | Fill #2

## 2017-04-18 MED FILL — FLUTICASONE PROP 50 MCG SPR: 50 | 15 days supply | Qty: 16 | Fill #1

## 2017-04-18 MED FILL — $HUMULIN R 100 UNITS/ML VIA: 100 | 16 days supply | Qty: 10 | Fill #0

## 2017-04-18 MED FILL — METFORMIN HCL ER 500 MG TAB: 500 | 30 days supply | Qty: 60 | Fill #0

## 2017-04-18 MED FILL — IBUPROFEN 600 MG TABLET: 600 | 10 days supply | Qty: 30 | Fill #0

## 2017-04-18 MED FILL — ESCITALOPRAM 20 MG TABLET: 20 | 30 days supply | Qty: 30 | Fill #1

## 2017-04-18 MED FILL — TRUEplus LANCETS 28G MISC: 25 days supply | Qty: 100 | Fill #2

## 2017-04-18 MED FILL — TRUE METRIX TEST STRIP: 25 days supply | Qty: 100 | Fill #2

## 2017-04-18 MED FILL — LISINOPRIL-HCTZ 10-12.5 MG: 10-12.5 | 30 days supply | Qty: 60 | Fill #1

## 2017-04-18 MED FILL — PROAIR HFA 90 MCG INHALER: 108 (90 BAS | 25 days supply | Qty: 9 | Fill #0

## 2017-04-18 MED FILL — $LANTUS SOLOSTAR 100 UNITS/: 100 | 30 days supply | Qty: 30 | Fill #1

## 2017-04-18 MED FILL — GABAPENTIN 600 MG TABLET: 600 | 30 days supply | Qty: 90 | Fill #2

## 2017-04-18 NOTE — Progress Notes (Signed)
    S:     Chief Complaint  Patient presents with  . Medication Management    Patient arrives in good spirits.  Presents for diabetes evaluation, education, and management at the request of Fredia Beets, NP. Patient was referred on 03/16/17.  Patient was last seen by Primary Care Provider on 8/8//18.   Patient reports adherence with medications but again self titrated her Lantus to 40 units BID after getting fastings in the 300s with the Lantus 50 units daily.   Current diabetes medications include: Lantus 40 units twice daily, Insulin Regular 10-20 units sliding scale, and metformin 500 mg BID.  Patient denies hypoglycemic events. She does not check her blood sugars at home.  She reports that: oatmeal, yogurt, applesauce  She reports that she wants a referral to the dental clinic at [facility]. She called and spoke to them and they said that they would see her if she has a referral.   O:  Physical Exam   ROS   Lab Results  Component Value Date   HGBA1C 13.0 03/16/2017   There were no vitals filed for this visit.  Home fasting CBG: 82-124 2 hour post-prandial/random CBG: 300s-400s  POCT glucose = 256  A/P: Diabetes longstanding currently uncontrolled based on A1c of 13. Patient denies hypoglycemic events and is able to verbalize appropriate hypoglycemia management plan. Patient reports adherence with medication. Control is suboptimal due to sedentary lifestyle and dietary indiscretion.  Continue current medications and will change Lantus dose to reflect that she has been taking - 40 units BID. Since patient is unable to eat much, will not change rapid acting at this time. Will need meter with readings at next visit to confirm her reported readings and then can adjust as needed. Will not administer insulin in clinic due to patient not eating and risk of hypoglycemia.   Patient needs crown replaced. Patient gave me fax number and I was able to pull the referral form  online. Completed and reviewed by [facility]. I faxed the form today. Patient will follow up with [facility] next week for pain and MRI results review.  Next A1C anticipated October 2018.    Written patient instructions provided.  Total time in face to face counseling 15 minutes.   Patient seen with Waverly Ferrari, PharmD Candidate

## 2017-04-18 NOTE — Patient Instructions (Addendum)
Thanks for coming to see [place]  Continue current medications  Follow up with [facility] in 1 week for tooth pain

## 2017-04-18 NOTE — Telephone Encounter (Signed)
Patient provided fax number for [facility] clinic [phone]. Was able to locate referral form needed. Will give to [facility]

## 2017-04-19 ENCOUNTER — Telehealth: Payer: Self-pay | Admitting: Neurology

## 2017-04-19 NOTE — Telephone Encounter (Signed)
Patient called needing to get MRI results. Please call. Thanks

## 2017-04-19 NOTE — Telephone Encounter (Signed)
Pt rqstng MRI results

## 2017-04-19 NOTE — Telephone Encounter (Signed)
Please inform patient that MRI shows that the findings is most consistent with a meningioma causing mass effect on the structures behind the eye, causing her vision changes.  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  Dr. Georgie Chard last note indicated referral to neurosurgery, endocrinology, and opthalmology.  Please check the status of these.

## 2017-04-20 NOTE — Telephone Encounter (Signed)
Patient lmom to have someone return her call. Thanks

## 2017-04-20 NOTE — Telephone Encounter (Signed)
Patient provided fax number for [facility] clinic [phone]. Was able to locate referral form needed.   I also call them & they gave me the same information fax number [phone] the referral contact person name is Museum/gallery conservator.

## 2017-04-20 NOTE — Telephone Encounter (Signed)
Lm on VM for Pt to call back for MRI results-referrals were put in on 03/26/17, will call thiose offices to check status.

## 2017-04-20 NOTE — Telephone Encounter (Signed)
[name] call, advsd her of MRI results. Advsd her of Appt w/Dr Elayne Snare on 05/29/17 and that they have been trying to reach her w/appt date and her VM was full. Gave Pt telephone numbers for [facility], [facility] and [facility] to contact them directly to schedule. Pt agreed it will better than them trying to reach her. Reminded her of f/u appt here 06/29/17

## 2017-04-26 ENCOUNTER — Telehealth: Payer: Self-pay | Admitting: Family Medicine

## 2017-04-26 NOTE — Telephone Encounter (Signed)
Amber, from [facility], called regarding the referral that was sent to them [facility]) from [place] for Ms Pirozzi. They are needing more information about the patient. Please return the call to [phone] and send information to [phone] (fax).  Thank you.

## 2017-04-27 ENCOUNTER — Encounter: Payer: Self-pay | Admitting: Family Medicine

## 2017-04-27 ENCOUNTER — Ambulatory Visit: Payer: Self-pay | Attending: Family Medicine | Admitting: Family Medicine

## 2017-04-27 VITALS — BP 116/63 | HR 77 | Temp 98.3°F | Resp 18 | Ht 64.0 in | Wt 189.6 lb

## 2017-04-27 DIAGNOSIS — H547 Unspecified visual loss: Secondary | ICD-10-CM | POA: Insufficient documentation

## 2017-04-27 DIAGNOSIS — Z794 Long term (current) use of insulin: Secondary | ICD-10-CM | POA: Insufficient documentation

## 2017-04-27 DIAGNOSIS — Z7982 Long term (current) use of aspirin: Secondary | ICD-10-CM | POA: Insufficient documentation

## 2017-04-27 DIAGNOSIS — F17210 Nicotine dependence, cigarettes, uncomplicated: Secondary | ICD-10-CM | POA: Insufficient documentation

## 2017-04-27 DIAGNOSIS — K0889 Other specified disorders of teeth and supporting structures: Secondary | ICD-10-CM | POA: Insufficient documentation

## 2017-04-27 DIAGNOSIS — K047 Periapical abscess without sinus: Secondary | ICD-10-CM | POA: Insufficient documentation

## 2017-04-27 DIAGNOSIS — E1100 Type 2 diabetes mellitus with hyperosmolarity without nonketotic hyperglycemic-hyperosmolar coma (NKHHC): Secondary | ICD-10-CM | POA: Insufficient documentation

## 2017-04-27 DIAGNOSIS — F17200 Nicotine dependence, unspecified, uncomplicated: Secondary | ICD-10-CM

## 2017-04-27 DIAGNOSIS — Z299 Encounter for prophylactic measures, unspecified: Secondary | ICD-10-CM

## 2017-04-27 DIAGNOSIS — E1101 Type 2 diabetes mellitus with hyperosmolarity with coma: Secondary | ICD-10-CM

## 2017-04-27 DIAGNOSIS — Z79899 Other long term (current) drug therapy: Secondary | ICD-10-CM | POA: Insufficient documentation

## 2017-04-27 LAB — GLUCOSE, POCT (MANUAL RESULT ENTRY): POC Glucose: 104 mg/dl — AB (ref 70–99)

## 2017-04-27 MED ORDER — CLINDAMYCIN HCL 300 MG PO CAPS: 300.0000 mg | ORAL_CAPSULE | Freq: Three times a day (TID) | ORAL | 0 refills | Status: DC

## 2017-04-27 MED ORDER — VARENICLINE TARTRATE 0.5 MG X 11 & 1 MG X 42 PO MISC
ORAL | 0 refills | Status: DC
Start: 2017-04-27 — End: 2017-06-19

## 2017-04-27 MED ORDER — FLUCONAZOLE 150 MG PO TABS: 150.0000 mg | ORAL_TABLET | Freq: Once | ORAL | 0 refills | Status: AC

## 2017-04-27 MED FILL — FLUCONAZOLE 150 MG TABLET: 150 | 1 days supply | Qty: 1 | Fill #0

## 2017-04-27 MED FILL — CLINDAMYCIN HCL 300 MG CAP: 300 | 10 days supply | Qty: 30 | Fill #0

## 2017-04-27 NOTE — Patient Instructions (Addendum)
Apply for [facility] Card   Dental Abscess A dental abscess is pus in or around a tooth. Follow these instructions at home:  Take medicines only as told by your dentist.  If you were prescribed antibiotic medicine, finish all of it even if you start to feel better.  Rinse your mouth (gargle) often with salt water.  Do not drive or use heavy machinery, like a lawn mower, while taking pain medicine.  Do not apply heat to the outside of your mouth.  Keep all follow-up visits as told by your dentist. This is important. Contact a doctor if:  Your pain is worse, and medicine does not help. Get help right away if:  You have a fever or chills.  Your symptoms suddenly get worse.  You have a very bad headache.  You have problems breathing or swallowing.  You have trouble opening your mouth.  You have puffiness (swelling) in your neck or around your eye. This information is not intended to replace advice given to you by your health care provider. Make sure you discuss any questions you have with your health care provider. Document Released: 12/29/2014 Document Revised: 01/20/2016 Document Reviewed: 08/11/2014 Elsevier Interactive Patient Education  [employer].

## 2017-04-27 NOTE — Telephone Encounter (Signed)
I faxed the referral form after [place] approved it but I can't speak to why the patient needs this referral. Will forward to [facility]

## 2017-04-27 NOTE — Progress Notes (Signed)
Subjective:  Patient ID: Barbara Wang, female    DOB: July 24, 1966  Age: 51 y.o. MRN: [id number]  CC: Dental Pain   HPI Yuridia RIELEY KHALSA presents for dental pain. Recent history of dental clinic visit. She reports dental work could not be  Completed due to. She brings prescription for clindamycin and requests medication fill. History of diabetes. Symptoms: decreased visual acuity for greater than 3 months. Patient denies foot ulcerations, nausea, paresthesia of the feet, polydipsia, polyuria, visual disturbances and vomiting.  Evaluation to date has been included: fasting blood sugar and hemoglobin A1C.  Home sugars: patient does not check sugars. Treatment to date: metformin, novolog, and lantus.  She is a current smoker. She reports smoking 1/2 pack of cigarettes per day for 18 years.. She is ready to quit at this time.      Outpatient Medications Prior to Visit  Medication Sig Dispense Refill  . albuterol (PROVENTIL HFA;VENTOLIN HFA) 108 (90 Base) MCG/ACT inhaler Inhale 2 puffs into the lungs every 6 (six) hours as needed for wheezing or shortness of breath. 1 Inhaler 0  . aspirin 81 MG EC tablet Take 1 tablet (81 mg total) by mouth daily. 90 tablet 3  . atorvastatin (LIPITOR) 40 MG tablet Take 1 tablet (40 mg total) by mouth daily at 6 PM. 90 tablet 3  . Blood Glucose Monitoring Suppl (TRUE METRIX GO GLUCOSE METER) w/Device KIT 1 each by Does not apply route every 8 (eight) hours as needed. 1 kit 0  . escitalopram (LEXAPRO) 20 MG tablet Take 1 tablet (20 mg total) by mouth at bedtime. take 1 tab daily 30 tablet 2  . fluticasone (FLONASE) 50 MCG/ACT nasal spray Place 2 sprays into both nostrils 2 (two) times daily as needed for allergies or rhinitis. 16 g 6  . fluticasone (FLOVENT HFA) 220 MCG/ACT inhaler Inhale 2 puffs into the lungs 2 (two) times daily as needed (shortness of breath/ wheezing).    . gabapentin (NEURONTIN) 600 MG tablet Take 1 tablet (600 mg total) by mouth 3  (three) times daily. 90 tablet 3  . glucose blood test strip Use as instructed 100 each 12  . guaiFENesin (MUCINEX) 600 MG 12 hr tablet Take 1 tablet (600 mg total) by mouth 2 (two) times daily. 60 tablet 0  . hydrocortisone cream 0.5 % Apply 1 application topically 2 (two) times daily. 30 g 0  . ibuprofen (ADVIL,MOTRIN) 600 MG tablet TAKE 1 TABLET BY MOUTH EVERY 8 HOURS AS NEEDED FOR MODERATE PAIN (TAKE WITH FOOD.). 30 tablet 0  . Insulin Glargine (LANTUS) 100 UNIT/ML Solostar Pen Inject 40 Units into the skin 2 (two) times daily. 90 mL 3  . insulin regular (HUMULIN R) 100 units/mL injection Inject 0.1-0.2 mLs (10-20 Units total) into the skin 3 (three) times daily before meals. Sliding scale 10 mL 11  . lisinopril-hydrochlorothiazide (PRINZIDE,ZESTORETIC) 10-12.5 MG tablet Take 2 tablets by mouth daily. 60 tablet 3  . loratadine (CLARITIN) 10 MG tablet Take 10 mg by mouth daily as needed for allergies or rhinitis.   3  . metFORMIN (GLUCOPHAGE XR) 500 MG 24 hr tablet Take 1 tablet (500 mg total) by mouth 2 (two) times daily with a meal. 60 tablet 2  . nitrofurantoin, macrocrystal-monohydrate, (MACROBID) 100 MG capsule Take 1 capsule (100 mg total) by mouth 2 (two) times daily. 10 capsule 0  . pantoprazole (PROTONIX) 40 MG tablet Take 1 tablet (40 mg total) by mouth daily. 30 tablet 0  . tiZANidine (ZANAFLEX)  4 MG tablet Take 1 tablet (4 mg total) by mouth at bedtime as needed for muscle spasms. 30 tablet 0  . topiramate (TOPAMAX) 50 MG tablet Take 1 tablet (50 mg total) by mouth at bedtime. 30 tablet 2  . TRUEPLUS LANCETS 26G MISC 1 each by Does not apply route every 8 (eight) hours as needed. 100 each 12   Facility-Administered Medications Prior to Visit  Medication Dose Route Frequency Provider Last Rate Last Dose  . insulin aspart (novoLOG) injection 10 Units  10 Units Subcutaneous Once Langeland, Dawn T, MD      . insulin aspart (novoLOG) injection 20 Units  20 Units Subcutaneous Once  Burke Terry R, FNP        ROS Review of Systems  Constitutional: Negative.   HENT: Positive for dental problem.   Eyes: Positive for visual disturbance.  Respiratory: Negative.   Cardiovascular: Negative.   Gastrointestinal: Negative.   Genitourinary: Negative.   Musculoskeletal: Negative.   Neurological: Negative.   Psychiatric/Behavioral: Negative.    Objective:  BP 116/63 (BP Location: Left Arm, Patient Position: Sitting, Cuff Size: Normal)   Pulse 77   Temp 98.3 F (36.8 C) (Oral)   Resp 18   Ht _0  (1.626 m)   Wt 189 lb 9.6 oz (86 kg)   SpO2 97%   BMI 32.54 kg/m   BP/Weight 04/27/2017 04/04/2017 3/82/5053  Systolic BP 976 734 193  Diastolic BP 63 79 72  Wt. (Lbs) 189.6 188.6 185  BMI 32.54 32.37 31.76     Physical Exam  Constitutional: She appears well-developed and well-nourished.  HENT:  Head: Normocephalic and atraumatic.  Right Ear: External ear normal.  Left Ear: External ear normal.  Nose: Nose normal.  Mouth/Throat: Oropharynx is clear and moist.    Eyes: Pupils are equal, round, and reactive to light. Conjunctivae and EOM are normal.  Neck: Normal range of motion. Neck supple. No JVD present.  Cardiovascular: Normal rate, regular rhythm, normal heart sounds and intact distal pulses.   Pulmonary/Chest: Effort normal and breath sounds normal.  Abdominal: Soft. Bowel sounds are normal.  Lymphadenopathy:    She has no cervical adenopathy.  Skin: Skin is warm and dry.  Nursing note and vitals reviewed.   Assessment & Plan:   Problem List Items Addressed This Visit      Endocrine   Type 2 diabetes mellitus with hyperosmolar nonketotic hyperglycemia (HCC)   Relevant Orders   Glucose (CBG) (Completed)    Other Visit Diagnoses    Dental abscess    -  Primary   Relevant Medications   clindamycin (CLEOCIN) 300 MG capsule   Other Relevant Orders   Ambulatory referral to Dentistry   Decreased vision       Visual acuity screen   Relevant  Orders   Ambulatory referral to Ophthalmology   Prophylactic measure          fluconazole (DIFLUCAN) 150 MG tablet   Ready to quit smoking       Relevant Medications   varenicline (CHANTIX STARTING MONTH PAK) 0.5 MG X 11 & 1 MG X 42 tablet      Meds ordered this encounter  Medications  . clindamycin (CLEOCIN) 300 MG capsule    Sig: Take 1 capsule (300 mg total) by mouth 3 (three) times daily.    Dispense:  30 capsule    Refill:  0    Order Specific Question:   Supervising Provider    Answer:   Angelica Chessman  E [[id number]]  . fluconazole (DIFLUCAN) 150 MG tablet    Sig: Take 1 tablet (150 mg total) by mouth once.    Dispense:  1 tablet    Refill:  0    Order Specific Question:   Supervising Provider    Answer:   Tresa Garter [id number]  . varenicline (CHANTIX STARTING MONTH PAK) 0.5 MG X 11 & 1 MG X 42 tablet    Sig: Take one 0.5 mg tablet by mouth once daily for 3 days, then increase to one 0.5 mg tablet twice daily for 4 days, then increase to one 1 mg tablet twice daily.    Dispense:  53 tablet    Refill:  0    Order Specific Question:   Supervising Provider    Answer:   Tresa Garter [[id number]]    Follow-up: Return in about 2 months (around 06/27/2017), or if symptoms worsen or fail to improve, for Diabetes.   Alfonse Spruce FNP

## 2017-04-27 NOTE — Progress Notes (Signed)
Patient is here for f/up toothache

## 2017-05-14 ENCOUNTER — Ambulatory Visit: Payer: Self-pay | Attending: Family Medicine

## 2017-05-16 ENCOUNTER — Emergency Department (HOSPITAL_COMMUNITY)
Admission: EM | Admit: 2017-05-16 | Discharge: 2017-05-16 | Disposition: A | Discharge status: Home / Self Care | Payer: Self-pay | Attending: Emergency Medicine | Admitting: Emergency Medicine

## 2017-05-16 ENCOUNTER — Encounter (HOSPITAL_COMMUNITY): Payer: Self-pay | Admitting: Emergency Medicine

## 2017-05-16 DIAGNOSIS — Z5321 Procedure and treatment not carried out due to patient leaving prior to being seen by health care provider: Secondary | ICD-10-CM | POA: Insufficient documentation

## 2017-05-16 DIAGNOSIS — R112 Nausea with vomiting, unspecified: Secondary | ICD-10-CM | POA: Insufficient documentation

## 2017-05-16 HISTORY — DX: Neoplasm of unspecified behavior of brain: D49.6

## 2017-05-16 LAB — CBC
HCT: 41.2 % (ref 36.0–46.0)
Hemoglobin: 14 g/dL (ref 12.0–15.0)
MCH: 29.2 pg (ref 26.0–34.0)
MCHC: 34 g/dL (ref 30.0–36.0)
MCV: 86 fL (ref 78.0–100.0)
Platelets: 350 10*3/uL (ref 150–400)
RBC: 4.79 MIL/uL (ref 3.87–5.11)
RDW: 13 % (ref 11.5–15.5)
WBC: 6.1 10*3/uL (ref 4.0–10.5)

## 2017-05-16 LAB — COMPREHENSIVE METABOLIC PANEL
ALT: 24 U/L (ref 14–54)
AST: 23 U/L (ref 15–41)
Albumin: 3.8 g/dL (ref 3.5–5.0)
Alkaline Phosphatase: 95 U/L (ref 38–126)
Anion gap: 8 (ref 5–15)
BUN: 14 mg/dL (ref 6–20)
CO2: 28 mmol/L (ref 22–32)
Calcium: 10 mg/dL (ref 8.9–10.3)
Chloride: 102 mmol/L (ref 101–111)
Creatinine, Ser: 1 mg/dL (ref 0.44–1.00)
GFR calc Af Amer: >60 mL/min (ref >60)
GFR calc non Af Amer: >60 mL/min (ref >60)
Glucose, Bld: 271 mg/dL — ABNORMAL HIGH (ref 65–99)
Potassium: 4.2 mmol/L (ref 3.5–5.1)
Sodium: 138 mmol/L (ref 135–145)
Total Bilirubin: 0.5 mg/dL (ref 0.3–1.2)
Total Protein: 7.9 g/dL (ref 6.5–8.1)

## 2017-05-16 LAB — LIPASE, BLOOD: Lipase: 41 U/L (ref 11–51)

## 2017-05-16 MED ORDER — ONDANSETRON 4 MG PO TBDP
ORAL_TABLET | ORAL | Status: AC
  Filled 2017-05-16: qty 1

## 2017-05-16 MED ORDER — ONDANSETRON 4 MG PO TBDP
4.0000 mg | ORAL_TABLET | Freq: Once | ORAL | Status: AC | PRN
  Administered 2017-05-16: 4 mg via ORAL

## 2017-05-16 NOTE — ED Notes (Signed)
Called for vitals multiple times with no answer

## 2017-05-16 NOTE — ED Notes (Signed)
Pt reports emesis X2 days, pt states she went to [facility] last night for same. Pt states she has a brain tumor and they think her N/V is r/t to that. Zofran ODT given in triage.

## 2017-05-29 ENCOUNTER — Ambulatory Visit (INDEPENDENT_AMBULATORY_CARE_PROVIDER_SITE_OTHER): Payer: Self-pay | Admitting: Endocrinology

## 2017-05-29 ENCOUNTER — Encounter: Payer: Self-pay | Admitting: Endocrinology

## 2017-05-29 VITALS — BP 130/82 | HR 80 | Ht 64.0 in | Wt 189.6 lb

## 2017-05-29 DIAGNOSIS — Z794 Long term (current) use of insulin: Secondary | ICD-10-CM

## 2017-05-29 DIAGNOSIS — R930 Abnormal findings on diagnostic imaging of skull and head, not elsewhere classified: Secondary | ICD-10-CM

## 2017-05-29 DIAGNOSIS — E1165 Type 2 diabetes mellitus with hyperglycemia: Secondary | ICD-10-CM

## 2017-05-29 DIAGNOSIS — E230 Hypopituitarism: Secondary | ICD-10-CM

## 2017-05-29 LAB — T4, FREE: Free T4: 1 ng/dL (ref 0.60–1.60)

## 2017-05-29 LAB — CORTISOL: Cortisol, Plasma: 8.7 ug/dL

## 2017-05-29 NOTE — Patient Instructions (Signed)
Check blood sugars on waking up  3/7 days  Also check blood sugars about 2 hours after a meal and do this after different meals by rotation  Recommended blood sugar levels on waking up is 90-130 and about 2 hours after meal is 130-160  Please bring your blood sugar monitor to each visit, thank you   

## 2017-05-29 NOTE — Progress Notes (Signed)
Patient ID: Barbara Wang, female   DOB: 1966/06/19, 51 y.o.   MRN: [id number]          Referring physician: Tomi Likens  Chief complaint: Brain tumor  History of Present Illness:    She has been referred here by her neurologist for evaluation of her pituitary function. She says she has been told a couple of years ago in [place] that she had a brain tumor but she did not pursue management of this at that time.  At that time she was being treated for viral meningitis and probably had the tumor diagnosed incidentally. No records are available on Epic  Patient does not remember details well and does not know why she was seeing the neurologist here locally this year, probably had been referred for headaches She apparently had an MRI done in August and this showed the following She has a mass measuring 2.6 x 2.8 x 2.1 centimeters (AP x Transverse x CC). The mass abuts the superior aspect of the cavernous segments of both internal carotid arteries. The mass extends into the sella, mildly flattening the pituitary gland.  The patient does not complain of any unusual fatigue although at times may feel a little weaker or tired She has had other issues including UTI and diabetes that have not been controlled She does complain of cold intolerance for a couple of years Also appears to have lost weight despite a fairly good appetite No nausea or other GI symptoms recently or consistently   Labs done in 02/2017 show the following  FSH 56, PROLACTIN 7.6 Growth hormone 0.3 TSH 0.49, ACTH 16 Electrolytes in 9/18 were normal     Past Medical History:  Diagnosis Date  . Asthma   . Brain tumor ([facility])   . Cellulitis 06/2015   rt hand   . Depression   . History of hiatal hernia    "it went away on it's own"  . Hyperlipemia   . Hypertension   . Neuropathy     "feet & hands "  . Obesity   . Pancreatitis   . Type II diabetes mellitus (HCC)    insulin dependent     Past Surgical  History:  Procedure Laterality Date  . ABDOMINAL HYSTERECTOMY    . [place]; 1987  . CHOLECYSTECTOMY  05/11/2012   Procedure: LAPAROSCOPIC CHOLECYSTECTOMY WITH INTRAOPERATIVE CHOLANGIOGRAM;  Surgeon: Gayland Curry, MD,FACS;  Location: [facility];  Service: General;  Laterality: N/A;  . SHOULDER ARTHROSCOPY W/ ROTATOR CUFF REPAIR Right   . TUBAL LIGATION  10/25/1999   Archie Endo 01/11/2011    Family History  Problem Relation Age of Onset  . Stroke Mother   . Diabetes Father   . Diabetes Sister     Social History:  reports that she has been smoking Cigarettes.  She has a 40.00 pack-year smoking history. She has never used smokeless tobacco. She reports that she does not drink alcohol or use drugs.  Allergies:  Allergies  Allergen Reactions  . Penicillins Hives and Shortness Of Breath    Has patient had a PCN reaction causing immediate rash, facial/tongue/throat swelling, SOB or lightheadedness with hypotension: Yes Has patient had a PCN reaction causing severe rash involving mucus membranes or skin necrosis: No Has patient had a PCN reaction that required hospitalization No Has patient had a PCN reaction occurring within the last 10 years: No If all of the above answers are "NO", then may proceed with Cephalosporin use.  . Azithromycin Hives  .  Sulfa Antibiotics Hives  . Tramadol Hives    Allergies as of 05/29/2017      Reactions   Penicillins Hives, Shortness Of Breath   Has patient had a PCN reaction causing immediate rash, facial/tongue/throat swelling, SOB or lightheadedness with hypotension: Yes Has patient had a PCN reaction causing severe rash involving mucus membranes or skin necrosis: No Has patient had a PCN reaction that required hospitalization No Has patient had a PCN reaction occurring within the last 10 years: No If all of the above answers are "NO", then may proceed with Cephalosporin use.   Azithromycin Hives   Sulfa Antibiotics Hives   Tramadol Hives       Medication List       Accurate as of 05/29/17  9:19 AM. Always use your most recent med list.          albuterol 108 (90 Base) MCG/ACT inhaler Commonly known as:  PROVENTIL HFA;VENTOLIN HFA Inhale 2 puffs into the lungs every 6 (six) hours as needed for wheezing or shortness of breath.   aspirin 81 MG EC tablet Take 1 tablet (81 mg total) by mouth daily.   atorvastatin 40 MG tablet Commonly known as:  LIPITOR Take 1 tablet (40 mg total) by mouth daily at 6 PM.   clindamycin 300 MG capsule Commonly known as:  CLEOCIN Take 1 capsule (300 mg total) by mouth 3 (three) times daily.   escitalopram 20 MG tablet Commonly known as:  LEXAPRO Take 1 tablet (20 mg total) by mouth at bedtime. take 1 tab daily   fluticasone 220 MCG/ACT inhaler Commonly known as:  FLOVENT HFA Inhale 2 puffs into the lungs 2 (two) times daily as needed (shortness of breath/ wheezing).   fluticasone 50 MCG/ACT nasal spray Commonly known as:  FLONASE Place 2 sprays into both nostrils 2 (two) times daily as needed for allergies or rhinitis.   gabapentin 600 MG tablet Commonly known as:  NEURONTIN Take 1 tablet (600 mg total) by mouth 3 (three) times daily.   glucose blood test strip Use as instructed   guaiFENesin 600 MG 12 hr tablet Commonly known as:  MUCINEX Take 1 tablet (600 mg total) by mouth 2 (two) times daily.   hydrocortisone cream 0.5 % Apply 1 application topically 2 (two) times daily.   ibuprofen 600 MG tablet Commonly known as:  ADVIL,MOTRIN TAKE 1 TABLET BY MOUTH EVERY 8 HOURS AS NEEDED FOR MODERATE PAIN (TAKE WITH FOOD.).   Insulin Glargine 100 UNIT/ML Solostar Pen Commonly known as:  LANTUS Inject 40 Units into the skin 2 (two) times daily.   insulin regular 100 units/mL injection Commonly known as:  HUMULIN R Inject 0.1-0.2 mLs (10-20 Units total) into the skin 3 (three) times daily before meals. Sliding scale   lisinopril-hydrochlorothiazide 10-12.5 MG tablet Commonly  known as:  PRINZIDE,ZESTORETIC Take 2 tablets by mouth daily.   loratadine 10 MG tablet Commonly known as:  CLARITIN Take 10 mg by mouth daily as needed for allergies or rhinitis.   metFORMIN 500 MG 24 hr tablet Commonly known as:  GLUCOPHAGE XR Take 1 tablet (500 mg total) by mouth 2 (two) times daily with a meal.   pantoprazole 40 MG tablet Commonly known as:  PROTONIX Take 1 tablet (40 mg total) by mouth daily.   tiZANidine 4 MG tablet Commonly known as:  ZANAFLEX Take 1 tablet (4 mg total) by mouth at bedtime as needed for muscle spasms.   topiramate 50 MG tablet Commonly known as:  TOPAMAX Take 1 tablet (50 mg total) by mouth at bedtime.   TRUE METRIX GO GLUCOSE METER w/Device Kit 1 each by Does not apply route every 8 (eight) hours as needed.   TRUEPLUS LANCETS 26G Misc 1 each by Does not apply route every 8 (eight) hours as needed.   varenicline 0.5 MG X 11 & 1 MG X 42 tablet Commonly known as:  CHANTIX STARTING MONTH PAK Take one 0.5 mg tablet by mouth once daily for 3 days, then increase to one 0.5 mg tablet twice daily for 4 days, then increase to one 1 mg tablet twice daily.       LABS:  No visits with results within 1 Week(s) from this visit.  Latest known visit with results is:  Admission on 05/16/2017, Discharged on 05/16/2017  Component Date Value Ref Range Status  . Lipase 05/16/2017 41  11 - 51 U/L Final  . Sodium 05/16/2017 138  135 - 145 mmol/L Final  . Potassium 05/16/2017 4.2  3.5 - 5.1 mmol/L Final  . Chloride 05/16/2017 102  101 - 111 mmol/L Final  . CO2 05/16/2017 28  22 - 32 mmol/L Final  . Glucose, Bld 05/16/2017 271* 65 - 99 mg/dL Final  . BUN 05/16/2017 14  6 - 20 mg/dL Final  . Creatinine, Ser 05/16/2017 1.00  0.44 - 1.00 mg/dL Final  . Calcium 05/16/2017 10.0  8.9 - 10.3 mg/dL Final  . Total Protein 05/16/2017 7.9  6.5 - 8.1 g/dL Final  . Albumin 05/16/2017 3.8  3.5 - 5.0 g/dL Final  . AST 05/16/2017 23  15 - 41 U/L Final  . ALT  05/16/2017 24  14 - 54 U/L Final  . Alkaline Phosphatase 05/16/2017 95  38 - 126 U/L Final  . Total Bilirubin 05/16/2017 0.5  0.3 - 1.2 mg/dL Final  . GFR calc non Af Amer 05/16/2017 >60  >60 mL/min Final  . GFR calc Af Amer 05/16/2017 >60  >60 mL/min Final   Comment: (NOTE) The eGFR has been calculated using the CKD EPI equation. This calculation has not been validated in all clinical situations. eGFR's persistently <60 mL/min signify possible Chronic Kidney Disease.   . Anion gap 05/16/2017 8  5 - 15 Final  . WBC 05/16/2017 6.1  4.0 - 10.5 K/uL Final  . RBC 05/16/2017 4.79  3.87 - 5.11 MIL/uL Final  . Hemoglobin 05/16/2017 14.0  12.0 - 15.0 g/dL Final  . HCT 05/16/2017 41.2  36.0 - 46.0 % Final  . MCV 05/16/2017 86.0  78.0 - 100.0 fL Final  . MCH 05/16/2017 29.2  26.0 - 34.0 pg Final  . MCHC 05/16/2017 34.0  30.0 - 36.0 g/dL Final  . RDW 05/16/2017 13.0  11.5 - 15.5 % Final  . Platelets 05/16/2017 350  150 - 400 K/uL Final        Review of Systems  Constitutional: Positive for weight loss. Negative for reduced appetite.  HENT: Positive for headaches.        Has headaches mostly in the back of her head  Eyes: Positive for blurred vision and visual disturbance.       She has variable vision with some blurring at times  Respiratory: Negative for shortness of breath.   Cardiovascular: Negative for leg swelling.  Gastrointestinal: Negative for nausea.       Episode of vomiting last month treated at the ER  Endocrine: Positive for cold intolerance and light-headedness. Negative for fatigue and polydipsia.  At times lightheaded.   Genitourinary: Positive for nocturia and dysuria.       Nocturia once.  She says she has had burning with urination at times.  She had been given Cipro reportedly had the emergency room but she has not started this  Musculoskeletal: Positive for muscle cramps.  Skin: Negative for rash.  Neurological: Positive for weakness. Negative for tingling.        Last month had felt weak for a week only No numbness or tingling in her feet  Psychiatric/Behavioral: Positive for insomnia.       She has insomnia or anxiety at times   Diabetes: This is managed by her PCP She has been on insulin for some time She says she was supposed to take 50 units Lantus twice a day that she was getting low sugars and has cut it down to 40 on her own  However she says her blood sugars are higher because of having a UTI She thinks her sugars are higher after meals but she has not adjusted her Regular Insulin to control these She has not been referred here for diabetes management Appears to have persistently poor control  Lab Results  Component Value Date   HGBA1C 13.0 03/16/2017   HGBA1C 12.2 01/08/2017   HGBA1C  09/18/2016     Comment:     Greater than 15.0   Lab Results  Component Value Date   MICROALBUR 2.4 02/02/2016   LDLCALC 74 01/22/2016   CREATININE 1.00 05/16/2017     PHYSICAL EXAM:  BP 130/82   Pulse 80   Ht '5\' 4"'$  (1.626 m)   Wt 189 lb 9.6 oz (86 kg)   SpO2 95%   BMI 32.54 kg/m   GENERAL:   She has mild generalized obesity  No pallor, clubbing, cervical lymphadenopathy or edema.    Skin:  no rash or pigmentation.  EYES:  Externally normal.  Fundii: Unable to focus on fundus Peripheral vision appears to be mildly decreased bilaterally by confrontation  ENT: Oral mucosa and tongue normal.  THYROID:  Right side is palpable about 1-1/2 times normal, slightly nodular but left side just palpable  HEART:  Normal  S1 and S2; no murmur or click.  CHEST:  Normal shape.  Lungs: Vescicular breath sounds heard equally.   No crepitations/ wheeze.  ABDOMEN:  No distention.  Liver and spleen not palpable.  No other mass or tenderness.  NEUROLOGICAL: .Reflexes are bilaterally normal with slightly slow relaxation at ankles and biceps.  JOINTS:  Normal.   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 with symptoms of some fatigue and cold intolerance Although she has no significant symptoms suggestive of adrenal insufficiency except for occasional lightheadedness she has lost weight for unclear reasons and blood pressure is low normal standing Cortisol level has not been checked and also free T4 needs to be done  She does need to be seen by the neurosurgeon for evaluation for surgical treatment and also she needs evaluation of her visual fields   DIABETES: Poorly controlled with A1c 13 Patient's diabetes needs to be better controlled and she will discuss with her PCP about more formal referral here for further management.  Currently does not appear to be getting adequate mealtime insulin with blood sugars are over 300 at times  ?  UTI: She will discuss with PCP  PLAN:    Check  cortisol and free T4  Consider ACTH stimulation test if cortisol low-normal  Have given her the number for [facility] to schedule consultation  Follow-up in about 6 weeks  Recommended that she increase her regular insulin to get better postprandial coverage and also at least try 500 mg metformin once a day with food   Consultation note sent to the referring physician  [facility] 05/29/2017, 9:19 AM

## 2017-05-30 ENCOUNTER — Telehealth: Payer: Self-pay | Admitting: Family Medicine

## 2017-05-30 NOTE — Telephone Encounter (Signed)
Amber, from [facility], called regarding the referral that was sent to them [facility]) from [place] for Ms Worden. They are needing more information about the patient. Please return the call to [phone] and send information to [phone] (fax). She left 6 messages on my voice mail the last one 9/27 Thank you

## 2017-05-31 NOTE — Progress Notes (Signed)
Please call to let patient know that the lab results are normal and no further action needed.  Patient needs to see me back after her surgery

## 2017-06-05 ENCOUNTER — Other Ambulatory Visit: Payer: Self-pay

## 2017-06-05 DIAGNOSIS — I10 Essential (primary) hypertension: Secondary | ICD-10-CM

## 2017-06-07 NOTE — Telephone Encounter (Signed)
I spoke with amber from [facility] and she said that the only way she can bee seen at their facility if she is medical compromise like she has to be sick enough and she wont qualify for it,  amber told me that they already gave her information about the [facility] is beside of them  but she did not take it .

## 2017-06-07 NOTE — Telephone Encounter (Signed)
Please inform patient that she does not qualify to be seen at their facility. She has the option on follow up with the information [facility] provided her about alternative options. Or our office can place order for referral to dentistry who can see her. Her choice.

## 2017-06-08 ENCOUNTER — Other Ambulatory Visit: Payer: Self-pay | Admitting: Family Medicine

## 2017-06-08 DIAGNOSIS — K029 Dental caries, unspecified: Secondary | ICD-10-CM

## 2017-06-08 DIAGNOSIS — K0889 Other specified disorders of teeth and supporting structures: Secondary | ICD-10-CM

## 2017-06-08 NOTE — Telephone Encounter (Signed)
Spoke with patient and she prefer a referral from our office I already explain once the referral is made they will contact her with information where she needs to go the date & time of the appt

## 2017-06-08 NOTE — Telephone Encounter (Signed)
CMA call regarding f/up with her dental issues   Patient was aware and understood

## 2017-06-08 NOTE — Telephone Encounter (Signed)
Referral placed.

## 2017-06-11 ENCOUNTER — Other Ambulatory Visit: Payer: Self-pay

## 2017-06-11 DIAGNOSIS — I10 Essential (primary) hypertension: Secondary | ICD-10-CM

## 2017-06-11 LAB — BASIC METABOLIC PANEL
BUN: 17 mg/dL (ref 7–25)
CO2: 29 mmol/L (ref 20–32)
Calcium: 9.2 mg/dL (ref 8.6–10.4)
Chloride: 107 mmol/L (ref 98–110)
Creat: 0.88 mg/dL (ref 0.50–1.05)
Glucose, Bld: 313 mg/dL — ABNORMAL HIGH (ref 65–99)
Potassium: 4 mmol/L (ref 3.5–5.3)
Sodium: 143 mmol/L (ref 135–146)

## 2017-06-15 ENCOUNTER — Telehealth: Payer: Self-pay

## 2017-06-15 NOTE — Telephone Encounter (Signed)
-----   Message from Pieter Partridge, DO sent at 06/14/2017  7:12 AM EDT ----- Her blood sugar is elevated.  Otherwise, her labs look okay

## 2017-06-15 NOTE — Telephone Encounter (Signed)
Called Pt, advsd her of lab results, she attributes increased BS to recent UTI

## 2017-06-19 ENCOUNTER — Other Ambulatory Visit: Payer: Self-pay | Admitting: *Deleted

## 2017-06-19 DIAGNOSIS — F17200 Nicotine dependence, unspecified, uncomplicated: Secondary | ICD-10-CM

## 2017-06-19 MED ORDER — VARENICLINE TARTRATE 0.5 MG X 11 & 1 MG X 42 PO MISC: ORAL | 0 refills | Status: DC

## 2017-06-19 NOTE — Telephone Encounter (Signed)
PRINTED FOR PASS PROGRAM 

## 2017-06-25 ENCOUNTER — Other Ambulatory Visit: Payer: Self-pay | Admitting: Pharmacist

## 2017-06-25 ENCOUNTER — Ambulatory Visit: Payer: Self-pay | Attending: Family Medicine | Admitting: Family Medicine

## 2017-06-25 ENCOUNTER — Encounter: Payer: Self-pay | Admitting: Family Medicine

## 2017-06-25 VITALS — BP 167/83 | HR 91 | Temp 98.1°F | Resp 18 | Ht 64.0 in | Wt 197.6 lb

## 2017-06-25 DIAGNOSIS — R159 Full incontinence of feces: Secondary | ICD-10-CM | POA: Insufficient documentation

## 2017-06-25 DIAGNOSIS — Z6830 Body mass index (BMI) 30.0-30.9, adult: Secondary | ICD-10-CM | POA: Insufficient documentation

## 2017-06-25 DIAGNOSIS — Z1231 Encounter for screening mammogram for malignant neoplasm of breast: Secondary | ICD-10-CM

## 2017-06-25 DIAGNOSIS — R32 Unspecified urinary incontinence: Secondary | ICD-10-CM | POA: Insufficient documentation

## 2017-06-25 DIAGNOSIS — E1100 Type 2 diabetes mellitus with hyperosmolarity without nonketotic hyperglycemic-hyperosmolar coma (NKHHC): Secondary | ICD-10-CM

## 2017-06-25 DIAGNOSIS — E669 Obesity, unspecified: Secondary | ICD-10-CM | POA: Insufficient documentation

## 2017-06-25 DIAGNOSIS — N390 Urinary tract infection, site not specified: Secondary | ICD-10-CM

## 2017-06-25 DIAGNOSIS — N3001 Acute cystitis with hematuria: Secondary | ICD-10-CM | POA: Insufficient documentation

## 2017-06-25 DIAGNOSIS — Z794 Long term (current) use of insulin: Secondary | ICD-10-CM | POA: Insufficient documentation

## 2017-06-25 DIAGNOSIS — Z79899 Other long term (current) drug therapy: Secondary | ICD-10-CM | POA: Insufficient documentation

## 2017-06-25 DIAGNOSIS — F17210 Nicotine dependence, cigarettes, uncomplicated: Secondary | ICD-10-CM | POA: Insufficient documentation

## 2017-06-25 DIAGNOSIS — Z1211 Encounter for screening for malignant neoplasm of colon: Secondary | ICD-10-CM

## 2017-06-25 DIAGNOSIS — Z7982 Long term (current) use of aspirin: Secondary | ICD-10-CM | POA: Insufficient documentation

## 2017-06-25 DIAGNOSIS — E1165 Type 2 diabetes mellitus with hyperglycemia: Secondary | ICD-10-CM | POA: Insufficient documentation

## 2017-06-25 DIAGNOSIS — I10 Essential (primary) hypertension: Secondary | ICD-10-CM | POA: Insufficient documentation

## 2017-06-25 DIAGNOSIS — E785 Hyperlipidemia, unspecified: Secondary | ICD-10-CM | POA: Insufficient documentation

## 2017-06-25 DIAGNOSIS — Z1239 Encounter for other screening for malignant neoplasm of breast: Secondary | ICD-10-CM

## 2017-06-25 DIAGNOSIS — Z87440 Personal history of urinary (tract) infections: Secondary | ICD-10-CM | POA: Insufficient documentation

## 2017-06-25 LAB — POCT URINALYSIS DIPSTICK
Bilirubin, UA: NEGATIVE
Glucose, UA: 100
Ketones, UA: 1.02
Nitrite, UA: NEGATIVE
Protein, UA: 30
Spec Grav, UA: 1.02 (ref 1.010–1.025)
Urobilinogen, UA: 0.2 E.U./dL
pH, UA: 6 (ref 5.0–8.0)

## 2017-06-25 LAB — GLUCOSE, POCT (MANUAL RESULT ENTRY): POC Glucose: 290 mg/dl — AB (ref 70–99)

## 2017-06-25 LAB — POCT GLYCOSYLATED HEMOGLOBIN (HGB A1C): Hemoglobin A1C: 10.8

## 2017-06-25 MED ORDER — LISINOPRIL-HYDROCHLOROTHIAZIDE 20-25 MG PO TABS: 1.0000 | ORAL_TABLET | Freq: Every day | ORAL | 2 refills | Status: DC

## 2017-06-25 MED ORDER — NITROFURANTOIN MONOHYD MACRO 100 MG PO CAPS: 100.0000 mg | ORAL_CAPSULE | Freq: Two times a day (BID) | ORAL | 0 refills | Status: DC

## 2017-06-25 MED ORDER — INSULIN GLARGINE 100 UNIT/ML SOLOSTAR PEN
40.0000 [IU] | PEN_INJECTOR | Freq: Two times a day (BID) | SUBCUTANEOUS | 0 refills | Status: DC
Start: 2017-06-25 — End: 2017-07-13

## 2017-06-25 MED FILL — $LANTUS SOLOSTAR 100 UNITS/: 100 | 30 days supply | Qty: 24 | Fill #0

## 2017-06-25 MED FILL — CIPROFLOXACIN HCL 250 MG TA: 250 | 7 days supply | Qty: 14 | Fill #0

## 2017-06-25 MED FILL — ESCITALOPRAM 20 MG TABLET: 20 | 30 days supply | Qty: 30 | Fill #2

## 2017-06-25 MED FILL — ?ATORVASTATIN 40MG TABLET: 40 | 30 days supply | Qty: 30 | Fill #3

## 2017-06-25 MED FILL — METFORMIN HCL ER 500 MG TAB: 500 | 30 days supply | Qty: 60 | Fill #1

## 2017-06-25 MED FILL — NITROFURANTOIN MONO-MCR 100: 100 | 5 days supply | Qty: 10 | Fill #0

## 2017-06-25 MED FILL — $HUMULIN R 100 UNITS/ML VIA: 100 | 32 days supply | Qty: 20 | Fill #1

## 2017-06-25 MED FILL — LISINOPRIL-HCTZ 20-25 MG TA: 20-25 | 30 days supply | Qty: 30 | Fill #0

## 2017-06-25 MED FILL — TRUE METRIX TEST STRIP: 25 days supply | Qty: 100 | Fill #3

## 2017-06-25 MED FILL — GABAPENTIN 600 MG TABS: 600 | 30 days supply | Qty: 90 | Fill #3

## 2017-06-25 MED FILL — !VENTOLIN HFA INHALER: 108 (90 BAS | 25 days supply | Qty: 18 | Fill #0

## 2017-06-25 NOTE — Progress Notes (Signed)
Subjective:  Patient ID: Barbara Wang, female    DOB: 03-24-1966  Age: 51 y.o. MRN: [id number]  CC: Hypertension and Diabetes   HPI Barbara Wang presents for UTI symptoms. She reports symptoms of burning and dysuria. Denies any hematuria, pelvic pain, or fevers. History of frequent UTI's. She reports wearing incontinence briefs for urine and bladder incontinence. She reports history of incontinence greater than 3 months. Denies any blood in stool or tarry stool. Urine incontinence worse with coughing or bearing down.  History of diabetes. Symptoms: decreased visual acuity for greater than 3 months. Patient denies foot ulcerations, nausea, paresthesia of the feet, polydipsia, polyuria, visual disturbances and vomiting.  Evaluation to date has included: fasting blood sugar and hemoglobin A1C.  Home sugars: patient does not check sugars. Treatment to date: metformin, novolog, and lantus. She reports not taking DM medications consistently. She does not check BP at home. Cardiac symptoms none. Patient denies chest pain, dyspnea and lower extremity edema.  Cardiovascular risk factors: diabetes mellitus, dyslipidemia, hypertension, obesity (BMI >= 30 kg/m2), sedentary lifestyle and smoking/ tobacco exposure. Use of agents associated with hypertension: none. History of target organ damage: none.she reports being out of her BP medications for several days. She is a current smoker. She reports smoking 1/2 pack of cigarettes per day for 18 years. She is ready to quit at this time. She reports history of brain tumor and forgetfulness. Forgetfulness worse last 4 months. She is under the care of her neurologist. She reports upcoming appointment to eye care specialist due to concern that tumor is affecting her eye function.  Outpatient Medications Prior to Visit  Medication Sig Dispense Refill  . albuterol (PROVENTIL HFA;VENTOLIN HFA) 108 (90 Base) MCG/ACT inhaler Inhale 2 puffs into the lungs every  6 (six) hours as needed for wheezing or shortness of breath. 1 Inhaler 0  . aspirin 81 MG EC tablet Take 1 tablet (81 mg total) by mouth daily. 90 tablet 3  . atorvastatin (LIPITOR) 40 MG tablet Take 1 tablet (40 mg total) by mouth daily at 6 PM. 90 tablet 3  . Blood Glucose Monitoring Suppl (TRUE METRIX GO GLUCOSE METER) w/Device KIT 1 each by Does not apply route every 8 (eight) hours as needed. 1 kit 0  . fluticasone (FLONASE) 50 MCG/ACT nasal spray Place 2 sprays into both nostrils 2 (two) times daily as needed for allergies or rhinitis. 16 g 6  . fluticasone (FLOVENT HFA) 220 MCG/ACT inhaler Inhale 2 puffs into the lungs 2 (two) times daily as needed (shortness of breath/ wheezing).    . gabapentin (NEURONTIN) 600 MG tablet Take 1 tablet (600 mg total) by mouth 3 (three) times daily. 90 tablet 3  . glucose blood test strip Use as instructed 100 each 12  . guaiFENesin (MUCINEX) 600 MG 12 hr tablet Take 1 tablet (600 mg total) by mouth 2 (two) times daily. 60 tablet 0  . hydrocortisone cream 0.5 % Apply 1 application topically 2 (two) times daily. 30 g 0  . ibuprofen (ADVIL,MOTRIN) 600 MG tablet TAKE 1 TABLET BY MOUTH EVERY 8 HOURS AS NEEDED FOR MODERATE PAIN (TAKE WITH FOOD.). 30 tablet 0  . Insulin Glargine (LANTUS) 100 UNIT/ML Solostar Pen Inject 40 Units into the skin 2 (two) times daily. (Patient taking differently: Inject 40 Units into the skin 2 (two) times daily. ) 90 mL 0  . insulin regular (HUMULIN R) 100 units/mL injection Inject 0.1-0.2 mLs (10-20 Units total) into the skin 3 (three) times  daily before meals. Sliding scale 10 mL 11  . loratadine (CLARITIN) 10 MG tablet Take 10 mg by mouth daily as needed for allergies or rhinitis.   3  . tiZANidine (ZANAFLEX) 4 MG tablet Take 1 tablet (4 mg total) by mouth at bedtime as needed for muscle spasms. (Patient not taking: Reported on 05/29/2017) 30 tablet 0  . topiramate (TOPAMAX) 50 MG tablet Take 1 tablet (50 mg total) by mouth at bedtime.  (Patient not taking: Reported on 05/29/2017) 30 tablet 2  . TRUEPLUS LANCETS 26G MISC 1 each by Does not apply route every 8 (eight) hours as needed. 100 each 12  . varenicline (CHANTIX STARTING MONTH PAK) 0.5 MG X 11 & 1 MG X 42 tablet Take one 0.5 mg tablet by mouth once daily for 3 days, then increase to one 0.5 mg tablet twice daily for 4 days, then increase to one 1 mg tablet twice daily. 112 tablet 0  . clindamycin (CLEOCIN) 300 MG capsule Take 1 capsule (300 mg total) by mouth 3 (three) times daily. (Patient not taking: Reported on 05/29/2017) 30 capsule 0  . escitalopram (LEXAPRO) 20 MG tablet Take 1 tablet (20 mg total) by mouth at bedtime. take 1 tab daily 30 tablet 2  . lisinopril-hydrochlorothiazide (PRINZIDE,ZESTORETIC) 10-12.5 MG tablet Take 2 tablets by mouth daily. 60 tablet 3  . pantoprazole (PROTONIX) 40 MG tablet Take 1 tablet (40 mg total) by mouth daily. 30 tablet 0   Facility-Administered Medications Prior to Visit  Medication Dose Route Frequency Provider Last Rate Last Dose  . insulin aspart (novoLOG) injection 10 Units  10 Units Subcutaneous Once Langeland, Dawn T, MD      . insulin aspart (novoLOG) injection 20 Units  20 Units Subcutaneous Once Verneda Hollopeter R, FNP        ROS Review of Systems  Constitutional: Negative.   Eyes: Negative.   Respiratory: Negative.   Cardiovascular: Negative.   Gastrointestinal: Negative.   Genitourinary: Positive for dysuria.       Incontinence of urine/bowel  Skin: Negative.    Objective:  BP (!) 167/83 (BP Location: Left Arm, Patient Position: Sitting, Cuff Size: Normal)   Pulse 91   Temp 98.1 F (36.7 C) (Oral)   Resp 18   Ht 5' 4" (1.626 m)   Wt 197 lb 9.6 oz (89.6 kg)   SpO2 100%   BMI 33.92 kg/m   BP/Weight 06/25/2017 05/29/2017 7/79/3903  Systolic BP 009 233 007  Diastolic BP 83 82 74  Wt. (Lbs) 197.6 189.6 190  BMI 33.92 32.54 32.61     Physical Exam  Constitutional: She appears well-developed and  well-nourished.  HENT:  Head: Normocephalic and atraumatic.  Right Ear: External ear normal.  Left Ear: External ear normal.  Mouth/Throat:    Cardiovascular: Normal rate, regular rhythm, normal heart sounds and intact distal pulses.   Pulmonary/Chest: Effort normal and breath sounds normal.  Abdominal: Soft. Bowel sounds are normal. There is no tenderness.  Skin: Skin is warm and dry.  Psychiatric: Her mood appears anxious.  Nursing note and vitals reviewed.   Assessment & Plan:    1. Uncontrolled type 2 diabetes mellitus with hyperglycemia ([employer]) Encouraged adherence with checking blood sugars and taking medications. Check CBG ACHS and bring log to next office visit. - Glucose (CBG) - HgB A1c  2. Acute cystitis with hematuria  - Urinalysis Dipstick - nitrofurantoin, macrocrystal-monohydrate, (MACROBID) 100 MG capsule; Take 1 capsule (100 mg total) by mouth 2 (two) times  daily.  Dispense: 10 capsule; Refill: 0  3. Essential hypertension  - lisinopril-hydrochlorothiazide (PRINZIDE,ZESTORETIC) 20-25 MG tablet; Take 1 tablet by mouth daily.  Dispense: 30 tablet; Refill: 2  4. Frequent UTI Refer for history of frequent UTI's and incontinence. Possible that fecal incontinence or tumor could be contributing to these symptoms, but will to refer to rule out any other causes. - Ambulatory referral to Urology  5. Incontinence of feces, unspecified fecal incontinence type  - Ambulatory referral to Gastroenterology  6. Urinary incontinence, unspecified type  - Ambulatory referral to Urology  7. Screen for colon cancer  - Ambulatory referral to Gastroenterology  8. Screening for breast cancer  - MM SCREENING BREAST TOMO BILATERAL; Future    Follow-up: Return in about 2 weeks (around 07/09/2017) for DM/BP/UTI.   Alfonse Spruce FNP

## 2017-06-25 NOTE — Progress Notes (Signed)
JAPatient been without BP med for 3-4 days   Patient has not been taking her BS medication    Patient has UTI SX

## 2017-06-25 NOTE — Patient Instructions (Addendum)
Take blood sugars three times a day and before bedtime.  Bring blood sugar log or meter to next office visit.   Urinary Tract Infection, Adult A urinary tract infection (UTI) is an infection of any part of the urinary tract. The urinary tract includes the:  Kidneys.  Ureters.  Bladder.  Urethra.  These organs make, store, and get rid of pee (urine) in the body. Follow these instructions at home:  Take over-the-counter and prescription medicines only as told by your doctor.  If you were prescribed an antibiotic medicine, take it as told by your doctor. Do not stop taking the antibiotic even if you start to feel better.  Avoid the following drinks: ? Alcohol. ? Caffeine. ? Tea. ? Carbonated drinks.  Drink enough fluid to keep your pee clear or pale yellow.  Keep all follow-up visits as told by your doctor. This is important.  Make sure to: ? Empty your bladder often and completely. Do not to hold pee for long periods of time. ? Empty your bladder before and after sex. ? Wipe from front to back after a bowel movement if you are female. Use each tissue one time when you wipe. Contact a doctor if:  You have back pain.  You have a fever.  You feel sick to your stomach (nauseous).  You throw up (vomit).  Your symptoms do not get better after 3 days.  Your symptoms go away and then come back. Get help right away if:  You have very bad back pain.  You have very bad lower belly (abdominal) pain.  You are throwing up and cannot keep down any medicines or water. This information is not intended to replace advice given to you by your health care provider. Make sure you discuss any questions you have with your health care provider. Document Released: 01/31/2008 Document Revised: 01/20/2016 Document Reviewed: 07/05/2015 Elsevier Interactive Patient Education  2018 [employer].   Type 2 Diabetes Mellitus, Self Care, Adult When you have type 2 diabetes (type 2 diabetes  mellitus), you must keep your blood sugar (glucose) under control. You can do this with:  Nutrition.  Exercise.  Lifestyle changes.  Medicines or insulin, if needed.  Support from your doctors and others.  How do I manage my blood sugar?  Check your blood sugar level every day, as often as told.  Call your doctor if your blood sugar is above your goal numbers for 2 tests in a row.  Have your A1c (hemoglobin A1c) level checked at least two times a year. Have it checked more often if your doctor tells you to. Your doctor will set treatment goals for you. Generally, you should have these blood sugar levels:  Before meals (preprandial): 80-130 mg/dL (4.4-7.2 mmol/L).  After meals (postprandial): lower than 180 mg/dL (10 mmol/L).  A1c level: less than 7%.  What do I need to know about high blood sugar? High blood sugar is called hyperglycemia. Know the signs of high blood sugar. Signs may include:  Feeling: ? Thirsty. ? Hungry. ? Very tired.  Needing to pee (urinate) more than usual.  Blurry vision.  What do I need to know about low blood sugar? Low blood sugar is called hypoglycemia. This is when blood sugar is at or below 70 mg/dL (3.9 mmol/L). Symptoms may include:  Feeling: ? Hungry. ? Worried or nervous (anxious). ? Sweaty and clammy. ? Confused. ? Dizzy. ? Sleepy. ? Sick to your stomach (nauseous).  Having: ? A fast heartbeat (palpitations). ?  A headache. ? A change in your vision. ? Jerky movements that you cannot control (seizure). ? Nightmares. ? Tingling or no feeling (numbness) around the mouth, lips, or tongue.  Having trouble with: ? Talking. ? Paying attention (concentrating). ? Moving (coordination). ? Sleeping.  Shaking.  Passing out (fainting).  Getting upset easily (irritability).  Treating low blood sugar  To treat low blood sugar, eat or drink something sugary right away. If you can think clearly and swallow safely, follow the  15:15 rule:  Take 15 grams of a fast-acting carb (carbohydrate). Some fast-acting carbs are: ? 1 tube of glucose gel. ? 3 sugar tablets (glucose pills). ? 6-8 pieces of hard candy. ? 4 oz (120 mL) of fruit juice. ? 4 oz (120 mL) regular (not diet) soda.  Check your blood sugar 15 minutes after you take the carb.  If your blood sugar is still at or below 70 mg/dL (3.9 mmol/L), take 15 grams of a carb again.  If your blood sugar does not go above 70 mg/dL (3.9 mmol/L) after 3 tries, get help right away.  After your blood sugar goes back to normal, eat a meal or a snack within 1 hour.  Treating very low blood sugar If your blood sugar is at or below 54 mg/dL (3 mmol/L), you have very low blood sugar (severe hypoglycemia). This is an emergency. Do not wait to see if the symptoms will go away. Get medical help right away. Call your local emergency services (911 in the [place] Do not drive yourself to the hospital. If you have very low blood sugar and you cannot eat or drink, you may need a glucagon shot (injection). A family member or friend should learn how to check your blood sugar and how to give you a glucagon shot. Ask your doctor if you need to have a glucagon shot kit at home. What else is important to manage my diabetes? Medicine Follow these instructions about insulin and diabetes medicines:  Take them as told by your doctor.  Adjust them as told by your doctor.  Do not run out of them.  Having diabetes can raise your risk for other long-term conditions. These include heart or kidney disease. Your doctor may prescribe medicines to help prevent problems from diabetes. Food   Make healthy food choices. These include: ? Chicken, fish, egg whites, and beans. ? Oats, whole wheat, bulgur, brown rice, quinoa, and millet. ? Fresh fruits and vegetables. ? Low-fat dairy products. ? Nuts, avocado, olive oil, and canola oil.  Make a food plan with a specialist (dietitian).  Follow  instructions from your doctor about what you cannot eat or drink.  Drink enough fluid to keep your pee (urine) clear or pale yellow.  Eat healthy snacks between healthy meals.  Keep track of carbs that you eat. Read food labels. Learn food serving sizes.  Follow your sick day plan when you cannot eat or drink normally. Make this plan with your doctor so it is ready to use. Activity  Exercise at least 3 times a week.  Do not go more than 2 days without exercising.  Talk with your doctor before you start a new exercise. Your doctor may need to adjust your insulin, medicines, or food. Lifestyle   Do not use any tobacco products. These include cigarettes, chewing tobacco, and e-cigarettes.If you need help quitting, ask your doctor.  Ask your doctor how much alcohol is safe for you.  Learn to deal with stress. If you  need help with this, ask your doctor. Body care  Stay up to date with your shots (immunizations).  Have your eyes and feet checked by a doctor as often as told.  Check your skin and feet every day. Check for cuts, bruises, redness, blisters, or sores.  Brush your teeth and gums two times a day.  Floss at least one time a day.  Go to the dentist least one time every 6 months.  Stay at a healthy weight. General instructions   Take over-the-counter and prescription medicines only as told by your doctor.  Share your diabetes care plan with: ? Your work or school. ? People you live with.  Check your pee (urine) for ketones: ? When you are sick. ? As told by your doctor.  Carry a card or wear jewelry that says that you have diabetes.  Ask your doctor: ? Do I need to meet with a diabetes educator? ? Where can I find a support group for people with diabetes?  Keep all follow-up visits as told by your doctor. This is important. Where to find more information: To learn more about diabetes, visit:  American Diabetes Association: www.diabetes.org  American  Association of Diabetes Educators: www.diabeteseducator.org/patient-resources  This information is not intended to replace advice given to you by your health care provider. Make sure you discuss any questions you have with your health care provider. Document Released: 12/06/2015 Document Revised: 01/20/2016 Document Reviewed: 09/17/2015 Elsevier Interactive Patient Education  [employer].

## 2017-06-26 ENCOUNTER — Encounter: Payer: Self-pay | Admitting: Physician Assistant

## 2017-06-29 ENCOUNTER — Ambulatory Visit: Payer: Self-pay | Admitting: Neurology

## 2017-07-06 ENCOUNTER — Ambulatory Visit: Payer: Self-pay | Admitting: Physician Assistant

## 2017-07-06 ENCOUNTER — Other Ambulatory Visit: Payer: Self-pay

## 2017-07-06 ENCOUNTER — Other Ambulatory Visit: Payer: Self-pay | Admitting: Endocrinology

## 2017-07-09 ENCOUNTER — Ambulatory Visit: Payer: Self-pay | Admitting: Endocrinology

## 2017-07-13 ENCOUNTER — Ambulatory Visit: Payer: Self-pay | Attending: Family Medicine | Admitting: Family Medicine

## 2017-07-13 ENCOUNTER — Encounter: Payer: Self-pay | Admitting: Family Medicine

## 2017-07-13 VITALS — BP 123/72 | HR 76 | Temp 98.4°F | Resp 18 | Ht 64.0 in | Wt 200.4 lb

## 2017-07-13 DIAGNOSIS — R51 Headache: Secondary | ICD-10-CM | POA: Insufficient documentation

## 2017-07-13 DIAGNOSIS — Z791 Long term (current) use of non-steroidal anti-inflammatories (NSAID): Secondary | ICD-10-CM | POA: Insufficient documentation

## 2017-07-13 DIAGNOSIS — E1101 Type 2 diabetes mellitus with hyperosmolarity with coma: Secondary | ICD-10-CM

## 2017-07-13 DIAGNOSIS — Z87898 Personal history of other specified conditions: Secondary | ICD-10-CM | POA: Insufficient documentation

## 2017-07-13 DIAGNOSIS — F17200 Nicotine dependence, unspecified, uncomplicated: Secondary | ICD-10-CM

## 2017-07-13 DIAGNOSIS — Z760 Encounter for issue of repeat prescription: Secondary | ICD-10-CM | POA: Insufficient documentation

## 2017-07-13 DIAGNOSIS — R05 Cough: Secondary | ICD-10-CM | POA: Insufficient documentation

## 2017-07-13 DIAGNOSIS — F17210 Nicotine dependence, cigarettes, uncomplicated: Secondary | ICD-10-CM | POA: Insufficient documentation

## 2017-07-13 DIAGNOSIS — I10 Essential (primary) hypertension: Secondary | ICD-10-CM | POA: Insufficient documentation

## 2017-07-13 DIAGNOSIS — E1165 Type 2 diabetes mellitus with hyperglycemia: Secondary | ICD-10-CM | POA: Insufficient documentation

## 2017-07-13 DIAGNOSIS — E785 Hyperlipidemia, unspecified: Secondary | ICD-10-CM | POA: Insufficient documentation

## 2017-07-13 DIAGNOSIS — Z7982 Long term (current) use of aspirin: Secondary | ICD-10-CM | POA: Insufficient documentation

## 2017-07-13 DIAGNOSIS — E669 Obesity, unspecified: Secondary | ICD-10-CM | POA: Insufficient documentation

## 2017-07-13 DIAGNOSIS — Z794 Long term (current) use of insulin: Secondary | ICD-10-CM | POA: Insufficient documentation

## 2017-07-13 DIAGNOSIS — Z79899 Other long term (current) drug therapy: Secondary | ICD-10-CM | POA: Insufficient documentation

## 2017-07-13 LAB — GLUCOSE, POCT (MANUAL RESULT ENTRY)
POC Glucose: 276 mg/dL — AB (ref 70–99)
POC Glucose: 234 mg/dl — AB (ref 70–99)

## 2017-07-13 MED ORDER — IBUPROFEN 600 MG PO TABS: ORAL_TABLET | ORAL | 0 refills | Status: DC

## 2017-07-13 MED ORDER — VARENICLINE TARTRATE 0.5 MG X 11 & 1 MG X 42 PO MISC: ORAL | 0 refills | Status: DC

## 2017-07-13 MED ORDER — LISINOPRIL-HYDROCHLOROTHIAZIDE 20-25 MG PO TABS: 1.0000 | ORAL_TABLET | Freq: Every day | ORAL | 2 refills | Status: DC

## 2017-07-13 MED ORDER — INSULIN ASPART 100 UNIT/ML ~~LOC~~ SOLN
10.0000 [IU] | Freq: Once | SUBCUTANEOUS | Status: AC
  Administered 2017-07-13: 10 [IU] via SUBCUTANEOUS

## 2017-07-13 MED ORDER — PHENYLEPHRINE-CHLORPHEN-DM 10-2-15 MG/5ML PO LIQD: 5.0000 mL | Freq: Four times a day (QID) | ORAL | 0 refills | Status: DC | PRN

## 2017-07-13 MED ORDER — INSULIN GLARGINE 100 UNIT/ML SOLOSTAR PEN: 40.0000 [IU] | PEN_INJECTOR | Freq: Two times a day (BID) | SUBCUTANEOUS | 2 refills | Status: DC

## 2017-07-13 MED ORDER — ALBUTEROL SULFATE HFA 108 (90 BASE) MCG/ACT IN AERS: 2.0000 | INHALATION_SPRAY | Freq: Four times a day (QID) | RESPIRATORY_TRACT | 3 refills | Status: DC | PRN

## 2017-07-13 MED FILL — $CHANTIX STARTING MONTH BOX: 0.5 MG X 11 | 28 days supply | Qty: 53 | Fill #0

## 2017-07-13 MED FILL — IBUPROFEN 600 MG TABS: 600 | 10 days supply | Qty: 30 | Fill #0

## 2017-07-13 NOTE — Progress Notes (Signed)
Subjective:  Patient ID: Barbara Wang, female    DOB: 1966/02/21  Age: 51 y.o. MRN: [id number]  CC: Diabetes and Hypertension   HPI Amillia H Gallery presents for hypertension and diabetes follow-up.  History of diabetes. Symptoms: decreased visual acuity for greater than 3 months. Patient denies foot ulcerations, nausea, paresthesia of the feet, polydipsia, polyuria, visual disturbances and vomiting.  Evaluation to date has included: fasting blood sugar and hemoglobin A1C.  Home sugars: patient does not check sugars.  She reports being without her meter for at least a month prior to finding it recently.  Treatment to date: metformin, novolog, and lantus. She reports not taking DM medications consistently.  She reports eating a regular diet.  Hypertension: She does not check BP at home. Cardiac symptoms none. Patient denies chest pain, dyspnea and lower extremity edema. Cardiovascular risk factors: diabetes mellitus, dyslipidemia, hypertension, obesity (BMI >= 30 kg/m2), sedentary lifestyle and smoking/ tobacco exposure. Use of agents associated with hypertension: none. History of target organ damage: none.she reports being out of her BP medications for several days. She is a current smoker. She reports smoking 1/2 pack of cigarettes per day for 18 years. She is ready to quit at this time.  Headaches: Recent history of brain tumor and forgetfulness. Forgetfulness worse last 4 months. She is under the care of her neurologist. She reports missing her scheduled appointment with eye care specialist.  Cough/congestion: Onset several weeks ago.  She denies any sick contacts, fevers, hemoptysis, or shortness of breath.  She reports cough is congested.  Associated symptoms include rhinorrhea.  She denies taking anything for symptoms.      Outpatient Medications Prior to Visit  Medication Sig Dispense Refill  . aspirin 81 MG EC tablet Take 1 tablet (81 mg total) by mouth daily. 90 tablet 3  .  atorvastatin (LIPITOR) 40 MG tablet Take 1 tablet (40 mg total) by mouth daily at 6 PM. 90 tablet 3  . Blood Glucose Monitoring Suppl (TRUE METRIX GO GLUCOSE METER) w/Device KIT 1 each by Does not apply route every 8 (eight) hours as needed. 1 kit 0  . fluticasone (FLONASE) 50 MCG/ACT nasal spray Place 2 sprays into both nostrils 2 (two) times daily as needed for allergies or rhinitis. 16 g 6  . gabapentin (NEURONTIN) 600 MG tablet Take 1 tablet (600 mg total) by mouth 3 (three) times daily. 90 tablet 3  . glucose blood test strip Use as instructed 100 each 12  . hydrocortisone cream 0.5 % Apply 1 application topically 2 (two) times daily. 30 g 0  . insulin regular (HUMULIN R) 100 units/mL injection Inject 0.1-0.2 mLs (10-20 Units total) into the skin 3 (three) times daily before meals. Sliding scale 10 mL 11  . tiZANidine (ZANAFLEX) 4 MG tablet Take 1 tablet (4 mg total) by mouth at bedtime as needed for muscle spasms. 30 tablet 0  . topiramate (TOPAMAX) 50 MG tablet Take 1 tablet (50 mg total) by mouth at bedtime. 30 tablet 2  . TRUEPLUS LANCETS 26G MISC 1 each by Does not apply route every 8 (eight) hours as needed. 100 each 12  . albuterol (PROVENTIL HFA;VENTOLIN HFA) 108 (90 Base) MCG/ACT inhaler Inhale 2 puffs into the lungs every 6 (six) hours as needed for wheezing or shortness of breath. 1 Inhaler 0  . fluticasone (FLOVENT HFA) 220 MCG/ACT inhaler Inhale 2 puffs into the lungs 2 (two) times daily as needed (shortness of breath/ wheezing).    Marland Kitchen guaiFENesin (  MUCINEX) 600 MG 12 hr tablet Take 1 tablet (600 mg total) by mouth 2 (two) times daily. 60 tablet 0  . ibuprofen (ADVIL,MOTRIN) 600 MG tablet TAKE 1 TABLET BY MOUTH EVERY 8 HOURS AS NEEDED FOR MODERATE PAIN (TAKE WITH FOOD.). 30 tablet 0  . Insulin Glargine (LANTUS) 100 UNIT/ML Solostar Pen Inject 40 Units into the skin 2 (two) times daily. (Patient taking differently: Inject 40 Units into the skin 2 (two) times daily. ) 90 mL 0  .  lisinopril-hydrochlorothiazide (PRINZIDE,ZESTORETIC) 20-25 MG tablet Take 1 tablet by mouth daily. 30 tablet 2  . loratadine (CLARITIN) 10 MG tablet Take 10 mg by mouth daily as needed for allergies or rhinitis.   3  . nitrofurantoin, macrocrystal-monohydrate, (MACROBID) 100 MG capsule Take 1 capsule (100 mg total) by mouth 2 (two) times daily. 10 capsule 0  . varenicline (CHANTIX STARTING MONTH PAK) 0.5 MG X 11 & 1 MG X 42 tablet Take one 0.5 mg tablet by mouth once daily for 3 days, then increase to one 0.5 mg tablet twice daily for 4 days, then increase to one 1 mg tablet twice daily. 112 tablet 0   Facility-Administered Medications Prior to Visit  Medication Dose Route Frequency Provider Last Rate Last Dose  . insulin aspart (novoLOG) injection 10 Units  10 Units Subcutaneous Once Langeland, Dawn T, MD      . insulin aspart (novoLOG) injection 20 Units  20 Units Subcutaneous Once Neithan Day R, FNP        ROS Review of Systems  Constitutional: Negative.   Eyes: Negative.   Respiratory: Negative.   Cardiovascular: Negative.   Gastrointestinal: Negative.   Skin: Negative.   Neurological: Positive for headaches.   Objective:  BP 123/72 (BP Location: Left Arm, Patient Position: Sitting, Cuff Size: Normal)   Pulse 76   Temp 98.4 F (36.9 C) (Oral)   Resp 18   Ht '5\' 4"'$  (1.626 m)   Wt 200 lb 6.4 oz (90.9 kg)   SpO2 98%   BMI 34.40 kg/m   BP/Weight 07/13/2017 06/25/2017 99/10/7167  Systolic BP 678 938 101  Diastolic BP 72 83 82  Wt. (Lbs) 200.4 197.6 189.6  BMI 34.4 33.92 32.54     Physical Exam  Constitutional: She appears well-developed and well-nourished.  HENT:  Head: Normocephalic and atraumatic.  Right Ear: External ear normal.  Left Ear: External ear normal.  Nose: Nose normal.  Mouth/Throat: Oropharynx is clear and moist.  Eyes: EOM are normal.  Red reflex present  Neck: Normal range of motion. Neck supple.  Cardiovascular: Normal rate, regular rhythm,  normal heart sounds and intact distal pulses.  Pulmonary/Chest: Effort normal and breath sounds normal. She has no wheezes.  Abdominal: Soft. Bowel sounds are normal. There is no tenderness.  Skin: Skin is warm and dry.  Nursing note and vitals reviewed.   Assessment & Plan:   1. Uncontrolled type 2 diabetes mellitus with hyperglycemia ([place]) Start checking blood sugars before meals and at bedtime. Bring glucometer and/or blood glucose log to next office visit. Importance of dietary changes and exercise discussed. - Glucose (CBG) - insulin aspart (novoLOG) injection 10 Units - Insulin Glargine (LANTUS) 100 UNIT/ML Solostar Pen; Inject 40 Units 2 (two) times daily into the skin.  Dispense: 90 mL; Refill: 2 - Glucose (CBG)  2. History of brain tumor Discussed the importance with patient following up with neurologist and his recommendations. Discussed the importance of following through with referral appointments. - Ambulatory referral to Ophthalmology  3. Essential hypertension  - lisinopril-hydrochlorothiazide (PRINZIDE,ZESTORETIC) 20-25 MG tablet; Take 1 tablet daily by mouth.  Dispense: 30 tablet; Refill: 2  4. Medication refill  - albuterol (PROVENTIL HFA;VENTOLIN HFA) 108 (90 Base) MCG/ACT inhaler; Inhale 2 puffs every 6 (six) hours as needed into the lungs for wheezing or shortness of breath.  Dispense: 1 Inhaler; Refill: 3 - ibuprofen (ADVIL,MOTRIN) 600 MG tablet; TAKE 1 TABLET BY MOUTH EVERY 8 HOURS AS NEEDED FOR MODERATE PAIN (TAKE WITH FOOD.).  Dispense: 30 tablet; Refill: 0  5. Ready to quit smoking  - varenicline (CHANTIX STARTING MONTH PAK) 0.5 MG X 11 & 1 MG X 42 tablet; Take one 0.5 mg tablet by mouth once daily for 3 days, then increase to one 0.5 mg tablet twice daily for 4 days, then increase to one 1 mg tablet twice daily.  Dispense: 112 tablet; Refill: 0    Follow-up: Return in about 8 weeks (around 09/07/2017) for DM .   Alfonse Spruce FNP

## 2017-07-13 NOTE — Progress Notes (Signed)
Patient complains headaches that is constantly there

## 2017-07-13 NOTE — Patient Instructions (Signed)
Follow up with neurologist and ophthalmologist.  Diabetes Mellitus and Food It is important for you to manage your blood sugar (glucose) level. Your blood glucose level can be greatly affected by what you eat. Eating healthier foods in the appropriate amounts throughout the day at about the same time each day will help you control your blood glucose level. It can also help slow or prevent worsening of your diabetes mellitus. Healthy eating may even help you improve the level of your blood pressure and reach or maintain a healthy weight. General recommendations for healthful eating and cooking habits include:  Eating meals and snacks regularly. Avoid going long periods of time without eating to lose weight.  Eating a diet that consists mainly of plant-based foods, such as fruits, vegetables, nuts, legumes, and whole grains.  Using low-heat cooking methods, such as baking, instead of high-heat cooking methods, such as deep frying.  Work with your dietitian to make sure you understand how to use the Nutrition Facts information on food labels. How can food affect me? Carbohydrates Carbohydrates affect your blood glucose level more than any other type of food. Your dietitian will help you determine how many carbohydrates to eat at each meal and teach you how to count carbohydrates. Counting carbohydrates is important to keep your blood glucose at a healthy level, especially if you are using insulin or taking certain medicines for diabetes mellitus. Alcohol Alcohol can cause sudden decreases in blood glucose (hypoglycemia), especially if you use insulin or take certain medicines for diabetes mellitus. Hypoglycemia can be a life-threatening condition. Symptoms of hypoglycemia (sleepiness, dizziness, and disorientation) are similar to symptoms of having too much alcohol. If your health care provider has given you approval to drink alcohol, do so in moderation and use the following guidelines:  Women  should not have more than one drink per day, and men should not have more than two drinks per day. One drink is equal to: ? 12 oz of beer. ? 5 oz of wine. ? 1 oz of hard liquor.  Do not drink on an empty stomach.  Keep yourself hydrated. Have water, diet soda, or unsweetened iced tea.  Regular soda, juice, and other mixers might contain a lot of carbohydrates and should be counted.  What foods are not recommended? As you make food choices, it is important to remember that all foods are not the same. Some foods have fewer nutrients per serving than other foods, even though they might have the same number of calories or carbohydrates. It is difficult to get your body what it needs when you eat foods with fewer nutrients. Examples of foods that you should avoid that are high in calories and carbohydrates but low in nutrients include:  Trans fats (most processed foods list trans fats on the Nutrition Facts label).  Regular soda.  Juice.  Candy.  Sweets, such as cake, pie, doughnuts, and cookies.  Fried foods.  What foods can I eat? Eat nutrient-rich foods, which will nourish your body and keep you healthy. The food you should eat also will depend on several factors, including:  The calories you need.  The medicines you take.  Your weight.  Your blood glucose level.  Your blood pressure level.  Your cholesterol level.  You should eat a variety of foods, including:  Protein. ? Lean cuts of meat. ? Proteins low in saturated fats, such as fish, egg whites, and beans. Avoid processed meats.  Fruits and vegetables. ? Fruits and vegetables that may help  control blood glucose levels, such as apples, mangoes, and yams.  Dairy products. ? Choose fat-free or low-fat dairy products, such as milk, yogurt, and cheese.  Grains, bread, pasta, and rice. ? Choose whole grain products, such as multigrain bread, whole oats, and brown rice. These foods may help control blood  pressure.  Fats. ? Foods containing healthful fats, such as nuts, avocado, olive oil, canola oil, and fish.  Does everyone with diabetes mellitus have the same meal plan? Because every person with diabetes mellitus is different, there is not one meal plan that works for everyone. It is very important that you meet with a dietitian who will help you create a meal plan that is just right for you. This information is not intended to replace advice given to you by your health care provider. Make sure you discuss any questions you have with your health care provider. Document Released: 05/11/2005 Document Revised: 01/20/2016 Document Reviewed: 07/11/2013 Elsevier Interactive Patient Education  2017 [employer].

## 2017-07-17 ENCOUNTER — Other Ambulatory Visit: Payer: Self-pay | Admitting: *Deleted

## 2017-07-17 MED ORDER — VARENICLINE TARTRATE 1 MG PO TABS: 1.0000 mg | ORAL_TABLET | Freq: Two times a day (BID) | ORAL | 0 refills | Status: DC

## 2017-07-17 NOTE — Telephone Encounter (Signed)
PRINTED FOR PASS PROGRAM 

## 2017-07-23 ENCOUNTER — Ambulatory Visit: Payer: Self-pay | Admitting: Physician Assistant

## 2017-07-27 ENCOUNTER — Encounter: Payer: Self-pay | Admitting: Family Medicine

## 2017-08-23 ENCOUNTER — Ambulatory Visit (HOSPITAL_COMMUNITY)
Admission: EM | Admit: 2017-08-23 | Discharge: 2017-08-23 | Disposition: A | Discharge status: Home / Self Care | Payer: Self-pay | Attending: Internal Medicine | Admitting: Internal Medicine

## 2017-08-23 ENCOUNTER — Encounter (HOSPITAL_COMMUNITY): Payer: Self-pay | Admitting: Emergency Medicine

## 2017-08-23 ENCOUNTER — Other Ambulatory Visit: Payer: Self-pay | Admitting: Family Medicine

## 2017-08-23 DIAGNOSIS — Z760 Encounter for issue of repeat prescription: Secondary | ICD-10-CM

## 2017-08-23 DIAGNOSIS — W19XXXA Unspecified fall, initial encounter: Secondary | ICD-10-CM

## 2017-08-23 DIAGNOSIS — S0093XA Contusion of unspecified part of head, initial encounter: Secondary | ICD-10-CM

## 2017-08-23 DIAGNOSIS — S40021A Contusion of right upper arm, initial encounter: Secondary | ICD-10-CM

## 2017-08-23 DIAGNOSIS — S0990XA Unspecified injury of head, initial encounter: Secondary | ICD-10-CM

## 2017-08-23 DIAGNOSIS — M25521 Pain in right elbow: Secondary | ICD-10-CM

## 2017-08-23 DIAGNOSIS — S0083XA Contusion of other part of head, initial encounter: Secondary | ICD-10-CM

## 2017-08-23 LAB — POCT I-STAT, CHEM 8
BUN: 12 mg/dL (ref 6–20)
Calcium, Ion: 1.1 mmol/L — ABNORMAL LOW (ref 1.15–1.40)
Chloride: 102 mmol/L (ref 101–111)
Creatinine, Ser: 0.7 mg/dL (ref 0.44–1.00)
Glucose, Bld: 107 mg/dL — ABNORMAL HIGH (ref 65–99)
HCT: 44 % (ref 36.0–46.0)
Hemoglobin: 15 g/dL (ref 12.0–15.0)
Potassium: 3.9 mmol/L (ref 3.5–5.1)
Sodium: 144 mmol/L (ref 135–145)
TCO2: 31 mmol/L (ref 22–32)

## 2017-08-23 MED FILL — ATORVASTATIN 40 MG TABLET: 40 | 30 days supply | Qty: 30 | Fill #4

## 2017-08-23 MED FILL — $LANTUS SOLOSTAR 100 UNITS/: 100 | 30 days supply | Qty: 24 | Fill #1

## 2017-08-23 MED FILL — !VENTOLIN HFA INHALER: 108 (90 BAS | 25 days supply | Qty: 18 | Fill #0

## 2017-08-23 MED FILL — $HUMULIN R 100 UNITS/ML VIA: 100 | 32 days supply | Qty: 20 | Fill #2

## 2017-08-23 MED FILL — METFORMIN HCL ER 500 MG TAB: 500 | 30 days supply | Qty: 60 | Fill #2

## 2017-08-23 MED FILL — LISINOPRIL-HCTZ 20-25 MG TA: 20-25 | 30 days supply | Qty: 30 | Fill #1

## 2017-08-23 NOTE — Discharge Instructions (Signed)
Apply ice to head and arm. Rest. Push fluids. Tylenol or ibuprofen as needed. A CT would definitively tell you if you have an internal head injury, therefore you would need to go to the Er. If you develop confusion, weakness, increased pain, numbness, tingling, change in speech, disorientation, vomiting or nausea please go immediately to the ER

## 2017-08-23 NOTE — ED Triage Notes (Addendum)
Fell 2-3 hours ago.  Getting up from being asleep and fell to the floor.  Patient hit the right side of face.  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  Patient has right elbow pain.  Patient has a headache .  Family member reports no changes in mental status.  No weakness.  Patient alert and oriented x 3.  No vision changes since falling out of bed.    Patient and family member aware that patient may need a head ct and would be sent to ed for that.  Patient specifically asked would there be a double charge and told yes.  Patient and family member opted to stay at [facility]
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
## 2017-09-07 ENCOUNTER — Ambulatory Visit: Payer: Self-pay | Admitting: Family Medicine

## 2017-09-14 ENCOUNTER — Encounter: Payer: Self-pay | Admitting: Family Medicine

## 2017-09-14 ENCOUNTER — Ambulatory Visit: Payer: Self-pay | Attending: Family Medicine | Admitting: Family Medicine

## 2017-09-14 VITALS — BP 158/81 | HR 76 | Temp 97.9°F | Ht 64.0 in | Wt 213.0 lb

## 2017-09-14 DIAGNOSIS — G5603 Carpal tunnel syndrome, bilateral upper limbs: Secondary | ICD-10-CM | POA: Insufficient documentation

## 2017-09-14 DIAGNOSIS — I10 Essential (primary) hypertension: Secondary | ICD-10-CM | POA: Insufficient documentation

## 2017-09-14 DIAGNOSIS — E1165 Type 2 diabetes mellitus with hyperglycemia: Secondary | ICD-10-CM | POA: Insufficient documentation

## 2017-09-14 DIAGNOSIS — Z794 Long term (current) use of insulin: Secondary | ICD-10-CM | POA: Insufficient documentation

## 2017-09-14 DIAGNOSIS — Z6836 Body mass index (BMI) 36.0-36.9, adult: Secondary | ICD-10-CM | POA: Insufficient documentation

## 2017-09-14 DIAGNOSIS — Z79899 Other long term (current) drug therapy: Secondary | ICD-10-CM | POA: Insufficient documentation

## 2017-09-14 DIAGNOSIS — E785 Hyperlipidemia, unspecified: Secondary | ICD-10-CM | POA: Insufficient documentation

## 2017-09-14 DIAGNOSIS — Z7982 Long term (current) use of aspirin: Secondary | ICD-10-CM | POA: Insufficient documentation

## 2017-09-14 DIAGNOSIS — E669 Obesity, unspecified: Secondary | ICD-10-CM | POA: Insufficient documentation

## 2017-09-14 DIAGNOSIS — J069 Acute upper respiratory infection, unspecified: Secondary | ICD-10-CM | POA: Insufficient documentation

## 2017-09-14 DIAGNOSIS — R0781 Pleurodynia: Secondary | ICD-10-CM | POA: Insufficient documentation

## 2017-09-14 DIAGNOSIS — F17210 Nicotine dependence, cigarettes, uncomplicated: Secondary | ICD-10-CM | POA: Insufficient documentation

## 2017-09-14 LAB — GLUCOSE, POCT (MANUAL RESULT ENTRY): POC Glucose: 114 mg/dl — AB (ref 70–99)

## 2017-09-14 LAB — POCT GLYCOSYLATED HEMOGLOBIN (HGB A1C): Hemoglobin A1C: 8.9

## 2017-09-14 MED ORDER — IBUPROFEN 600 MG PO TABS: 600.0000 mg | ORAL_TABLET | Freq: Three times a day (TID) | ORAL | 0 refills | Status: DC | PRN

## 2017-09-14 MED ORDER — INSULIN REGULAR HUMAN 100 UNIT/ML IJ SOLN: 10.0000 [IU] | Freq: Three times a day (TID) | INTRAMUSCULAR | 11 refills | Status: DC

## 2017-09-14 MED ORDER — DOXYCYCLINE HYCLATE 100 MG PO TABS: 100.0000 mg | ORAL_TABLET | Freq: Two times a day (BID) | ORAL | 0 refills | Status: DC

## 2017-09-14 MED FILL — ?DOXYCYCLINE 100MG TABLET: 100 | 5 days supply | Qty: 10 | Fill #0

## 2017-09-14 MED FILL — IBUPROFEN 600 MG TABLET: 600 | 10 days supply | Qty: 30 | Fill #0

## 2017-09-14 NOTE — Progress Notes (Signed)
Subjective:  Patient ID: Barbara Wang, female    DOB: 1966/04/07  Age: 52 y.o. MRN: [id number]  CC: Diabetes and Cough   HPI Barbara Wang presents for complaints of cough and hypertension/diabetes follow-up.  History of diabetes. Symptoms: decreased visual acuity for greater than 3 months. Patient denies foot ulcerations, nausea, paresthesia of the feet, polydipsia, polyuria, visual disturbances and vomiting.  Evaluation to date has included: fasting blood sugar and hemoglobin A1C.  Home sugars: patient does not check sugars. Treatment to date: metformin, novolog, and lantus. She brings her CBG log with her to office visit. Only days of CBG readings for month fo December. Lowest readings in log 49 and 70's. Patient reports not using SSI and administering insulin without checking CBG's prior. She also reports poor appetite some days.  She reports eating a regular diet.  Hypertension: She does not check BP at home. She did not take BP medication prior to office visit. Cardiac symptoms none.Patient denies chest pressure/discomfort, dyspnea and lower extremity edema. Cardiovascular risk factors: diabetes mellitus, dyslipidemia, hypertension, obesity (BMI >= 30 kg/m2), sedentary lifestyle and smoking/ tobacco exposure. Use of agents associated with hypertension: none. History of target organ damage: none.  She is a current smoker. She reports smoking 1/2 pack of cigarettes per day for 18 years. She is not ready to quit at this time.  Cough: Onset about 1 month ago. Cough is productive with clear phlegm. Associated symptoms include scratchy throat, ear ache, and chest pain. She reports pain is worsened with taking deep inhalation. She denies any sick contacts, fevers, hemoptysis, or shortness of breath. She c/o bilateral paresthesias of the hands and wrist. Onset a few days ago. She denies any swelling, decreased strength, or injury.    Outpatient Medications Prior to Visit  Medication Sig  Dispense Refill  . albuterol (PROVENTIL HFA;VENTOLIN HFA) 108 (90 Base) MCG/ACT inhaler Inhale 2 puffs every 6 (six) hours as needed into the lungs for wheezing or shortness of breath. 1 Inhaler 3  . aspirin 81 MG EC tablet Take 1 tablet (81 mg total) by mouth daily. 90 tablet 3  . atorvastatin (LIPITOR) 40 MG tablet Take 1 tablet (40 mg total) by mouth daily at 6 PM. 90 tablet 3  . Blood Glucose Monitoring Suppl (TRUE METRIX GO GLUCOSE METER) w/Device KIT 1 each by Does not apply route every 8 (eight) hours as needed. 1 kit 0  . fluticasone (FLONASE) 50 MCG/ACT nasal spray Place 2 sprays into both nostrils 2 (two) times daily as needed for allergies or rhinitis. 16 g 6  . gabapentin (NEURONTIN) 600 MG tablet Take 1 tablet (600 mg total) by mouth 3 (three) times daily. 90 tablet 3  . glucose blood test strip Use as instructed 100 each 12  . hydrocortisone cream 0.5 % Apply 1 application topically 2 (two) times daily. 30 g 0  . Insulin Glargine (LANTUS) 100 UNIT/ML Solostar Pen Inject 40 Units 2 (two) times daily into the skin. 90 mL 2  . lisinopril-hydrochlorothiazide (PRINZIDE,ZESTORETIC) 20-25 MG tablet Take 1 tablet daily by mouth. 30 tablet 2  . Phenylephrine-Chlorphen-DM 05-29-14 MG/5ML LIQD Take 5 mLs every 6 (six) hours as needed by mouth. 1 Bottle 0  . TRUEPLUS LANCETS 26G MISC 1 each by Does not apply route every 8 (eight) hours as needed. 100 each 12  . ibuprofen (ADVIL,MOTRIN) 600 MG tablet TAKE 1 TABLET BY MOUTH EVERY 8 HOURS AS NEEDED FOR MODERATE PAIN. TAKE WITH FOOD 30 tablet 0  .  insulin regular (HUMULIN R) 100 units/mL injection Inject 0.1-0.2 mLs (10-20 Units total) into the skin 3 (three) times daily before meals. Sliding scale 10 mL 11  . tiZANidine (ZANAFLEX) 4 MG tablet Take 1 tablet (4 mg total) by mouth at bedtime as needed for muscle spasms. (Patient not taking: Reported on 09/14/2017) 30 tablet 0  . topiramate (TOPAMAX) 50 MG tablet Take 1 tablet (50 mg total) by mouth at  bedtime. (Patient not taking: Reported on 09/14/2017) 30 tablet 2  . varenicline (CHANTIX CONTINUING MONTH PAK) 1 MG tablet Take 1 tablet (1 mg total) by mouth 2 (two) times daily. (Patient not taking: Reported on 09/14/2017) 112 tablet 0  . varenicline (CHANTIX STARTING MONTH PAK) 0.5 MG X 11 & 1 MG X 42 tablet Take one 0.5 mg tablet by mouth once daily for 3 days, then increase to one 0.5 mg tablet twice daily for 4 days, then increase to one 1 mg tablet twice daily. (Patient not taking: Reported on 09/14/2017) 112 tablet 0   Facility-Administered Medications Prior to Visit  Medication Dose Route Frequency Provider Last Rate Last Dose  . insulin aspart (novoLOG) injection 10 Units  10 Units Subcutaneous Once Langeland, Dawn T, MD      . insulin aspart (novoLOG) injection 20 Units  20 Units Subcutaneous Once Alfonse Spruce, FNP       Review of Systems  Constitutional: Negative.   HENT: Positive for ear pain.   Respiratory: Positive for cough.   Cardiovascular: Positive for chest pain.  Gastrointestinal: Negative.   Neurological: Positive for tingling.  Psychiatric/Behavioral: Negative for suicidal ideas.    Objective:  BP (!) 158/81   Pulse 76   Temp 97.9 F (36.6 C) (Oral)   Ht '5\' 4"'$  (1.626 m)   Wt 213 lb (96.6 kg)   SpO2 96%   BMI 36.56 kg/m   BP/Weight 09/14/2017 08/23/2017 97/98/9211  Systolic BP 941 740 814  Diastolic BP 81 76 72  Wt. (Lbs) 213 - 200.4  BMI 36.56 - 34.4   Physical Exam  Nursing note and vitals reviewed. Constitutional: She appears well-developed and well-nourished.  HENT:  Head: Normocephalic and atraumatic.  Right Ear: External ear normal.  Left Ear: External ear normal.  Nose: Nose normal.  Mouth/Throat: Oropharynx is clear and moist.  Eyes: Conjunctivae are normal. Pupils are equal, round, and reactive to light.  Neck: Normal range of motion. Neck supple.  Cardiovascular: Normal rate, regular rhythm, normal heart sounds and intact distal  pulses.  Respiratory: Effort normal and breath sounds normal.  GI: Soft. Bowel sounds are normal.  Musculoskeletal:  Phalen's sign +   Skin: Skin is warm and dry.  Psychiatric: She expresses no homicidal and no suicidal ideation. She expresses no suicidal plans and no homicidal plans. She is communicative. She is attentive.    Assessment & Plan:   1. Upper respiratory tract infection, unspecified type  Follow up if symptoms worsen or fail to improve. - doxycycline (VIBRA-TABS) 100 MG tablet; Take 1 tablet (100 mg total) by mouth 2 (two) times daily.  Dispense: 10 tablet; Refill: 0 - CBC - Respiratory virus panel - DG Chest 2 View; Future  2. Uncontrolled type 2 diabetes mellitus with hyperglycemia (HCC)  - POCT glycosylated hemoglobin (Hb A1C) - POCT glucose (manual entry) - insulin regular (HUMULIN R) 100 units/mL injection; Inject 0.1 mLs (10 Units total) into the skin 3 (three) times daily before meals. Do not give insulin of blood sugar is less than  120.  Dispense: 10 mL; Refill: 11  3. Essential hypertension, benign Patient has not taken antihypertensives prior to office visit. Recommend BP check with clinical pharmacist in 2 weeks. Follow up with PCP in 3 months.  4. Pleuritic chest pain  - DG Chest 2 View; Future  5. Bilateral carpal tunnel syndrome  Follow up if symptoms worsen or fail to improve. - ibuprofen (ADVIL,MOTRIN) 600 MG tablet; Take 1 tablet (600 mg total) by mouth every 8 (eight) hours as needed.  Dispense: 30 tablet; Refill: 0     Follow-up: Return in about 2 weeks (around 09/28/2017) for DM with [name] .   Alfonse Spruce FNP

## 2017-09-14 NOTE — Patient Instructions (Addendum)
Upper Respiratory Infection, Adult Most upper respiratory infections (URIs) are caused by a virus. A URI affects the nose, throat, and upper air passages. The most common type of URI is often called "the common cold." Follow these instructions at home:  Take medicines only as told by your doctor.  Gargle warm saltwater or take cough drops to comfort your throat as told by your doctor.  Use a warm mist humidifier or inhale steam from a shower to increase air moisture. This may make it easier to breathe.  Drink enough fluid to keep your pee (urine) clear or pale yellow.  Eat soups and other clear broths.  Have a healthy diet.  Rest as needed.  Go back to work when your fever is gone or your doctor says it is okay. ? You may need to stay home longer to avoid giving your URI to others. ? You can also wear a face mask and wash your hands often to prevent spread of the virus.  Use your inhaler more if you have asthma.  Do not use any tobacco products, including cigarettes, chewing tobacco, or electronic cigarettes. If you need help quitting, ask your doctor. Contact a doctor if:  You are getting worse, not better.  Your symptoms are not helped by medicine.  You have chills.  You are getting more short of breath.  You have brown or red mucus.  You have yellow or brown discharge from your nose.  You have pain in your face, especially when you bend forward.  You have a fever.  You have puffy (swollen) neck glands.  You have pain while swallowing.  You have white areas in the back of your throat. Get help right away if:  You have very bad or constant: ? Headache. ? Ear pain. ? Pain in your forehead, behind your eyes, and over your cheekbones (sinus pain). ? Chest pain.  You have long-lasting (chronic) lung disease and any of the following: ? Wheezing. ? Long-lasting cough. ? Coughing up blood. ? A change in your usual mucus.  You have a stiff neck.  You have  changes in your: ? Vision. ? Hearing. ? Thinking. ? Mood. This information is not intended to replace advice given to you by your health care provider. Make sure you discuss any questions you have with your health care provider. Document Released: 01/31/2008 Document Revised: 04/16/2016 Document Reviewed: 11/19/2013 Elsevier Interactive Patient Education  2018 Elsevier Inc.  

## 2017-09-15 LAB — CBC
Hematocrit: 40.4 % (ref 34.0–46.6)
Hemoglobin: 13.9 g/dL (ref 11.1–15.9)
MCH: 28.9 pg (ref 26.6–33.0)
MCHC: 34.4 g/dL (ref 31.5–35.7)
MCV: 84 fL (ref 79–97)
Platelets: 395 10*3/uL — ABNORMAL HIGH (ref 150–379)
RBC: 4.81 x10E6/uL (ref 3.77–5.28)
RDW: 13.5 % (ref 12.3–15.4)
WBC: 7.4 10*3/uL (ref 3.4–10.8)

## 2017-09-18 LAB — RESPIRATORY VIRUS PANEL
Adenovirus: NEGATIVE
Influenza A: NEGATIVE
Influenza B: NEGATIVE
Metapneumovirus: NEGATIVE
Parainfluenza 1: NEGATIVE
Parainfluenza 2: NEGATIVE
Parainfluenza 3: NEGATIVE
Respiratory Syncytial Virus A: NEGATIVE
Respiratory Syncytial Virus B: NEGATIVE
Rhinovirus: NEGATIVE

## 2017-09-24 ENCOUNTER — Telehealth (INDEPENDENT_AMBULATORY_CARE_PROVIDER_SITE_OTHER): Payer: Self-pay | Admitting: *Deleted

## 2017-09-24 NOTE — Telephone Encounter (Signed)
If she is experiencing bilateral ear complaints after completing antibiotic course for URI she would need to come in to be evaluated. She could try an over the counter antihistamine like Claritin and see if this improves symptoms otherwise she need to schedule an appointment.

## 2017-09-24 NOTE — Telephone Encounter (Signed)
Patient verified DOB Patient is aware of RSV being negative including flu. Patient is also aware of labs being normal and no infection and anemia being present. Patient has completed antibiotic and complains of bilateral ear pressure being present. Will route to PCP for advice.

## 2017-09-24 NOTE — Telephone Encounter (Signed)
-----   Message from Alfonse Spruce, [place] sent at 09/18/2017  1:51 PM EST ----- RSV was negative for some of the  most common respiratory virus including influenza. Continue to take medications as prescribed, increase fluid intake, and rest.  Labs normal. No signs of acute infection or anemia present.

## 2017-09-27 NOTE — Telephone Encounter (Signed)
Medical Assistant left message on patient's home and cell voicemail. Voicemail states to give a call back to [place] with [facility] at [phone]. Patient is aware of advice to pick up Claritin and take for 2 weeks. If symptoms persist contact the clinic to schedule an appointment to be evaluated.

## 2017-10-02 ENCOUNTER — Ambulatory Visit: Payer: Self-pay | Admitting: Pharmacist

## 2017-10-03 ENCOUNTER — Ambulatory Visit: Payer: Self-pay | Admitting: Pharmacist

## 2017-10-03 NOTE — Progress Notes (Deleted)
    S:     No chief complaint on file.   Patient arrives ***.  Presents for diabetes evaluation, education, and management at the request of ***. Patient was referred on ***.  Patient was last seen by Primary Care Provider on ***.   Patient reports Diabetes was diagnosed in ***.   Family/Social History:   Patient {Actions; denies-reports:120008} adherence with medications.  Current diabetes medications include: Lantus 40 units BID, insulin regular 10 units TID AC Current hypertension medications include: lisinopril-HCTZ 20-25 mg daily.   Patient {Actions; denies-reports:120008} hypoglycemic events.  Patient reported dietary habits: Eats *** meals/day Breakfast:*** Lunch:*** Dinner:*** Snacks:*** Drinks:***  Patient reported exercise habits:    Patient {Actions; denies-reports:120008} nocturia.  Patient {Actions; denies-reports:120008} neuropathy. Patient {Actions; denies-reports:120008} visual changes. Patient {Actions; denies-reports:120008} self foot exams.   [place]:  Physical Exam   ROS   Lab Results  Component Value Date   HGBA1C 8.9 09/14/2017   There were no vitals filed for this visit.  Home fasting CBG: ***  2 hour post-prandial/random CBG: ***.  10 year ASCVD risk: ***.  A/P: Diabetes longstanding/newly diagnosed currently ***. Patient {Actions; denies-reports:120008} hypoglycemic events and is able to verbalize appropriate hypoglycemia management plan. Patient {Actions; denies-reports:120008} adherence with medication. Control is suboptimal due to ***.  PICK 1 OF THE FOLLOWING 4 (DELETE the others AND THEN DELETE THIS LINE OF TEXT)  {Meds adjust:18428} basal insulin Lantus (insulin glargine). Patient will continue to titrate 1 unit/day if fasting CBGs > 100mg /dl until fasting CBGs reach goal or next visit.    {Meds adjust:18428} basal insulin Lantus (insulin glargine) to ***. {Meds adjust:18428} rapid insulin Novolog (insulin aspart) to  ***.   {Meds adjust:18428} Victoza (liraglutide) to ***.   {Meds ZHGDJM:42683} Trulicity (dulaglutide) to ***.   {Meds adjust:18428} Jardiance (empagliflozin) to ***.  Next A1C anticipated ***.    ASCVD risk greater than 7.5%. {Meds adjust:18428} Aspirin *** mg and {Meds adjust:18428} ***statin *** mg.   Hypertension longstanding/newly diagnosed currently ***.  Patient {Actions; denies-reports:120008} adherence with medication. Control is suboptimal due to ***.  Written patient instructions provided.  Total time in face to face counseling *** minutes.   Follow up in Pharmacist Clinic Visit ***.   Patient seen with ***

## 2017-10-05 MED FILL — METFORMIN HCL ER 500 MG TAB: 500 | 30 days supply | Qty: 60 | Fill #1

## 2017-10-05 MED FILL — $HUMULIN R 100 UNITS/ML VIA: 100 | 33 days supply | Qty: 20 | Fill #3

## 2017-10-05 MED FILL — $LANTUS SOLOSTAR 100 UNITS/: 100 | 30 days supply | Qty: 24 | Fill #2

## 2017-10-05 MED FILL — LISINOPRIL-HCTZ 20-25 MG TA: 20-25 | 30 days supply | Qty: 30 | Fill #2

## 2017-10-05 MED FILL — !VENTOLIN HFA INHALER: 108 (90 BAS | 25 days supply | Qty: 18 | Fill #1

## 2017-10-05 MED FILL — ?ATORVASTATIN 40MG TABLET: 40 | 30 days supply | Qty: 30 | Fill #5

## 2017-10-10 ENCOUNTER — Ambulatory Visit (HOSPITAL_COMMUNITY)
Admission: RE | Admit: 2017-10-10 | Discharge: 2017-10-10 | Disposition: A | Discharge status: Home / Self Care | Payer: Self-pay | Source: Ambulatory Visit | Attending: Physician Assistant | Admitting: Physician Assistant

## 2017-10-10 ENCOUNTER — Other Ambulatory Visit: Payer: Self-pay | Admitting: Physician Assistant

## 2017-10-10 ENCOUNTER — Ambulatory Visit: Payer: Self-pay | Attending: Family Medicine | Admitting: Physician Assistant

## 2017-10-10 VITALS — BP 146/84 | HR 102 | Temp 98.3°F | Resp 16 | Wt 206.4 lb

## 2017-10-10 DIAGNOSIS — I10 Essential (primary) hypertension: Secondary | ICD-10-CM | POA: Insufficient documentation

## 2017-10-10 DIAGNOSIS — E1142 Type 2 diabetes mellitus with diabetic polyneuropathy: Secondary | ICD-10-CM | POA: Insufficient documentation

## 2017-10-10 DIAGNOSIS — Z794 Long term (current) use of insulin: Secondary | ICD-10-CM | POA: Insufficient documentation

## 2017-10-10 DIAGNOSIS — F329 Major depressive disorder, single episode, unspecified: Secondary | ICD-10-CM | POA: Insufficient documentation

## 2017-10-10 DIAGNOSIS — J45909 Unspecified asthma, uncomplicated: Secondary | ICD-10-CM | POA: Insufficient documentation

## 2017-10-10 DIAGNOSIS — Z881 Allergy status to other antibiotic agents status: Secondary | ICD-10-CM | POA: Insufficient documentation

## 2017-10-10 DIAGNOSIS — Z79899 Other long term (current) drug therapy: Secondary | ICD-10-CM | POA: Insufficient documentation

## 2017-10-10 DIAGNOSIS — E785 Hyperlipidemia, unspecified: Secondary | ICD-10-CM | POA: Insufficient documentation

## 2017-10-10 DIAGNOSIS — J40 Bronchitis, not specified as acute or chronic: Secondary | ICD-10-CM | POA: Insufficient documentation

## 2017-10-10 DIAGNOSIS — Z882 Allergy status to sulfonamides status: Secondary | ICD-10-CM | POA: Insufficient documentation

## 2017-10-10 DIAGNOSIS — R918 Other nonspecific abnormal finding of lung field: Secondary | ICD-10-CM

## 2017-10-10 DIAGNOSIS — J9801 Acute bronchospasm: Secondary | ICD-10-CM

## 2017-10-10 DIAGNOSIS — R05 Cough: Secondary | ICD-10-CM | POA: Insufficient documentation

## 2017-10-10 DIAGNOSIS — K449 Diaphragmatic hernia without obstruction or gangrene: Secondary | ICD-10-CM | POA: Insufficient documentation

## 2017-10-10 DIAGNOSIS — Z880 Allergy status to penicillin: Secondary | ICD-10-CM | POA: Insufficient documentation

## 2017-10-10 DIAGNOSIS — E1165 Type 2 diabetes mellitus with hyperglycemia: Secondary | ICD-10-CM | POA: Insufficient documentation

## 2017-10-10 DIAGNOSIS — Z7952 Long term (current) use of systemic steroids: Secondary | ICD-10-CM | POA: Insufficient documentation

## 2017-10-10 DIAGNOSIS — Z7982 Long term (current) use of aspirin: Secondary | ICD-10-CM | POA: Insufficient documentation

## 2017-10-10 DIAGNOSIS — E669 Obesity, unspecified: Secondary | ICD-10-CM | POA: Insufficient documentation

## 2017-10-10 LAB — GLUCOSE, POCT (MANUAL RESULT ENTRY): POC Glucose: 226 mg/dl — AB (ref 70–99)

## 2017-10-10 MED ORDER — PROMETHAZINE-DM 6.25-15 MG/5ML PO SYRP
5.0000 mL | ORAL_SOLUTION | Freq: Four times a day (QID) | ORAL | 0 refills | Status: DC | PRN
Start: 2017-10-10 — End: 2018-02-15

## 2017-10-10 MED ORDER — DOXYCYCLINE HYCLATE 100 MG PO TABS: 100.0000 mg | ORAL_TABLET | Freq: Two times a day (BID) | ORAL | 0 refills | Status: DC

## 2017-10-10 MED ORDER — FLUCONAZOLE 150 MG PO TABS: 150.0000 mg | ORAL_TABLET | Freq: Once | ORAL | 0 refills | Status: AC

## 2017-10-10 MED ORDER — PREDNISONE 10 MG PO TABS: ORAL_TABLET | ORAL | 0 refills | Status: DC

## 2017-10-10 MED FILL — PROMETHAZINE-DM SYRUP: 6.25-15 | 6 days supply | Qty: 118 | Fill #0

## 2017-10-10 MED FILL — FLUCONAZOLE 150 MG TABLET: 150 | 1 days supply | Qty: 1 | Fill #0

## 2017-10-10 MED FILL — predniSONE 10 MG TABS: 10 | 6 days supply | Qty: 21 | Fill #0

## 2017-10-10 MED FILL — ?DOXYCYCLINE 100MG TABLET: 100 | 10 days supply | Qty: 20 | Fill #0

## 2017-10-10 NOTE — Patient Instructions (Signed)
Watch blood sugars while on prednisone.  If above 400 or symptomatic, seek medical attention  Prednisone tablets What is this medicine? PREDNISONE (PRED ni sone) is a corticosteroid. It is commonly used to treat inflammation of the skin, joints, lungs, and other organs. Common conditions treated include asthma, allergies, and arthritis. It is also used for other conditions, such as blood disorders and diseases of the adrenal glands. This medicine may be used for other purposes; ask your health care provider or pharmacist if you have questions. COMMON BRAND NAME(S): Deltasone, Predone, Sterapred, Sterapred DS What should I tell my health care provider before I take this medicine? They need to know if you have any of these conditions: -Cushing's syndrome -diabetes -glaucoma -heart disease -high blood pressure -infection (especially a virus infection such as chickenpox, cold sores, or herpes) -kidney disease -liver disease -mental illness -myasthenia gravis -osteoporosis -seizures -stomach or intestine problems -thyroid disease -an unusual or allergic reaction to lactose, prednisone, other medicines, foods, dyes, or preservatives -pregnant or trying to get pregnant -breast-feeding How should I use this medicine? Take this medicine by mouth with a glass of water. Follow the directions on the prescription label. Take this medicine with food. If you are taking this medicine once a day, take it in the morning. Do not take more medicine than you are told to take. Do not suddenly stop taking your medicine because you may develop a severe reaction. Your doctor will tell you how much medicine to take. If your doctor wants you to stop the medicine, the dose may be slowly lowered over time to avoid any side effects. Talk to your pediatrician regarding the use of this medicine in children. Special care may be needed. Overdosage: If you think you have taken too much of this medicine contact a poison  control center or emergency room at once. NOTE: This medicine is only for you. Do not share this medicine with others. What if I miss a dose? If you miss a dose, take it as soon as you can. If it is almost time for your next dose, talk to your doctor or health care professional. You may need to miss a dose or take an extra dose. Do not take double or extra doses without advice. What may interact with this medicine? Do not take this medicine with any of the following medications: -metyrapone -mifepristone This medicine may also interact with the following medications: -aminoglutethimide -amphotericin B -aspirin and aspirin-like medicines -barbiturates -certain medicines for diabetes, like glipizide or glyburide -cholestyramine -cholinesterase inhibitors -cyclosporine -digoxin -diuretics -ephedrine -female hormones, like estrogens and birth control pills -isoniazid -ketoconazole -NSAIDS, medicines for pain and inflammation, like ibuprofen or naproxen -phenytoin -rifampin -toxoids -vaccines -warfarin This list may not describe all possible interactions. Give your health care provider a list of all the medicines, herbs, non-prescription drugs, or dietary supplements you use. Also tell them if you smoke, drink alcohol, or use illegal drugs. Some items may interact with your medicine. What should I watch for while using this medicine? Visit your doctor or health care professional for regular checks on your progress. If you are taking this medicine over a prolonged period, carry an identification card with your name and address, the type and dose of your medicine, and your doctor's name and address. This medicine may increase your risk of getting an infection. Tell your doctor or health care professional if you are around anyone with measles or chickenpox, or if you develop sores or blisters that do not heal  properly. If you are going to have surgery, tell your doctor or health care  professional that you have taken this medicine within the last twelve months. Ask your doctor or health care professional about your diet. You may need to lower the amount of salt you eat. This medicine may affect blood sugar levels. If you have diabetes, check with your doctor or health care professional before you change your diet or the dose of your diabetic medicine. What side effects may I notice from receiving this medicine? Side effects that you should report to your doctor or health care professional as soon as possible: -allergic reactions like skin rash, itching or hives, swelling of the face, lips, or tongue -changes in emotions or moods -changes in vision -depressed mood -eye pain -fever or chills, cough, sore throat, pain or difficulty passing urine -increased thirst -swelling of ankles, feet Side effects that usually do not require medical attention (report to your doctor or health care professional if they continue or are bothersome): -confusion, excitement, restlessness -headache -nausea, vomiting -skin problems, acne, thin and shiny skin -trouble sleeping -weight gain This list may not describe all possible side effects. Call your doctor for medical advice about side effects. You may report side effects to FDA at 1-800-FDA-1088. Where should I keep my medicine? Keep out of the reach of children. Store at room temperature between 15 and 30 degrees C (59 and 86 degrees F). Protect from light. Keep container tightly closed. Throw away any unused medicine after the expiration date. NOTE: This sheet is a summary. It may not cover all possible information. If you have questions about this medicine, talk to your doctor, pharmacist, or health care provider.  2018 Elsevier/Gold Standard (2011-03-30 10:57:14)     Acute Bronchitis, Adult Acute bronchitis is when air tubes (bronchi) in the lungs suddenly get swollen. The condition can make it hard to breathe. It can also cause  these symptoms:  A cough.  Coughing up clear, yellow, or green mucus.  Wheezing.  Chest congestion.  Shortness of breath.  A fever.  Body aches.  Chills.  A sore throat.  Follow these instructions at home: Medicines  Take over-the-counter and prescription medicines only as told by your doctor.  If you were prescribed an antibiotic medicine, take it as told by your doctor. Do not stop taking the antibiotic even if you start to feel better. General instructions  Rest.  Drink enough fluids to keep your pee (urine) clear or pale yellow.  Avoid smoking and secondhand smoke. If you smoke and you need help quitting, ask your doctor. Quitting will help your lungs heal faster.  Use an inhaler, cool mist vaporizer, or humidifier as told by your doctor.  Keep all follow-up visits as told by your doctor. This is important. How is this prevented? To lower your risk of getting this condition again:  Wash your hands often with soap and water. If you cannot use soap and water, use hand sanitizer.  Avoid contact with people who have cold symptoms.  Try not to touch your hands to your mouth, nose, or eyes.  Make sure to get the flu shot every year.  Contact a doctor if:  Your symptoms do not get better in 2 weeks. Get help right away if:  You cough up blood.  You have chest pain.  You have very bad shortness of breath.  You become dehydrated.  You faint (pass out) or keep feeling like you are going to pass out.  You keep throwing up (vomiting).  You have a very bad headache.  Your fever or chills gets worse. This information is not intended to replace advice given to you by your health care provider. Make sure you discuss any questions you have with your health care provider. Document Released: 01/31/2008 Document Revised: 03/22/2016 Document Reviewed: 02/02/2016 Elsevier Interactive Patient Education  [employer].

## 2017-10-10 NOTE — Progress Notes (Signed)
Patient ID: Barbara Wang, female   DOB: Mar 30, 1966, 52 y.o.   MRN: [id number]     Barbara Wang, is a 52 y.o. female  QMV:[id number]  [id number]  DOB - [birth year]  Subjective:  Chief Complaint and HPI: Barbara Wang is a 53 y.o. female here today for Cough X 4-5 weeks.  Took 5 days of doxy and had no improvement. Still coughing and wheezing.  No fever.  No hemoptysis.  No recent travel.  No exposure to TB.  No weight loss.  Blood sugars have been less than 200 at home.  She has not had her meds this morning.  She has taken prednisone in the past and tolerated it well.  Inhaler gives her some relief.    ROS:   Constitutional:  No f/c, No night sweats, No unexplained weight loss. EENT:  No vision changes, No blurry vision, No hearing changes. No mouth, throat, or ear problems.  Respiratory: + cough, No SOB; + wheezing Cardiac: No CP, no palpitations GI:  No abd pain, No N/V/D. GU: No Urinary s/sx Musculoskeletal: No joint pain Neuro: No headache, no dizziness, no motor weakness.  Skin: No rash Endocrine:  No polydipsia. No polyuria.  Psych: Denies SI/HI  No problems updated.  ALLERGIES: Allergies  Allergen Reactions  . Penicillins Hives and Shortness Of Breath    Has patient had a PCN reaction causing immediate rash, facial/tongue/throat swelling, SOB or lightheadedness with hypotension: Yes Has patient had a PCN reaction causing severe rash involving mucus membranes or skin necrosis: No Has patient had a PCN reaction that required hospitalization No Has patient had a PCN reaction occurring within the last 10 years: No If all of the above answers are "NO", then may proceed with Cephalosporin use.  . Azithromycin Hives  . Sulfa Antibiotics Hives  . Tramadol Hives    PAST MEDICAL HISTORY: Past Medical History:  Diagnosis Date  . Asthma   . Brain tumor ([place])   . Cellulitis 06/2015   rt hand   . Depression   . History of hiatal hernia    "it  went away on it's own"  . Hyperlipemia   . Hypertension   . Neuropathy     "feet & hands "  . Obesity   . Pancreatitis   . Type II diabetes mellitus (HCC)    insulin dependent     MEDICATIONS AT HOME: Prior to Admission medications   Medication Sig Start Date End Date Taking? Authorizing Provider  albuterol (PROVENTIL HFA;VENTOLIN HFA) 108 (90 Base) MCG/ACT inhaler Inhale 2 puffs every 6 (six) hours as needed into the lungs for wheezing or shortness of breath. 07/13/17   Alfonse Spruce, FNP  aspirin 81 MG EC tablet Take 1 tablet (81 mg total) by mouth daily. 09/18/16   Maren Reamer, MD  atorvastatin (LIPITOR) 40 MG tablet Take 1 tablet (40 mg total) by mouth daily at 6 PM. 01/08/17   Langeland, Dawn T, MD  Blood Glucose Monitoring Suppl (TRUE METRIX GO GLUCOSE METER) w/Device KIT 1 each by Does not apply route every 8 (eight) hours as needed. 09/18/16   Maren Reamer, MD  doxycycline (VIBRA-TABS) 100 MG tablet Take 1 tablet (100 mg total) by mouth 2 (two) times daily. 10/10/17   Argentina Donovan, PA-C  fluconazole (DIFLUCAN) 150 MG tablet Take 1 tablet (150 mg total) by mouth once for 1 dose. 10/10/17 10/10/17  Argentina Donovan, PA-C  fluticasone (FLONASE) 50 MCG/ACT nasal spray Place 2 sprays  into both nostrils 2 (two) times daily as needed for allergies or rhinitis. 03/16/17   Alfonse Spruce, FNP  gabapentin (NEURONTIN) 600 MG tablet Take 1 tablet (600 mg total) by mouth 3 (three) times daily. 09/18/16   Lottie Mussel T, MD  glucose blood test strip Use as instructed 09/18/16   Lottie Mussel T, MD  hydrocortisone cream 0.5 % Apply 1 application topically 2 (two) times daily. 10/03/16   Tresa Garter, MD  ibuprofen (ADVIL,MOTRIN) 600 MG tablet Take 1 tablet (600 mg total) by mouth every 8 (eight) hours as needed. 09/14/17   Alfonse Spruce, FNP  Insulin Glargine (LANTUS) 100 UNIT/ML Solostar Pen Inject 40 Units 2 (two) times daily into the skin. 07/13/17    Alfonse Spruce, FNP  insulin regular (HUMULIN R) 100 units/mL injection Inject 0.1 mLs (10 Units total) into the skin 3 (three) times daily before meals. Do not give insulin of blood sugar is less than 120. 09/14/17   Hairston, Maylon Peppers, FNP  lisinopril-hydrochlorothiazide (PRINZIDE,ZESTORETIC) 20-25 MG tablet Take 1 tablet daily by mouth. 07/13/17   Alfonse Spruce, FNP  Phenylephrine-Chlorphen-DM 05-29-14 MG/5ML LIQD Take 5 mLs every 6 (six) hours as needed by mouth. Patient not taking: Reported on 10/10/2017 07/13/17   Alfonse Spruce, FNP  predniSONE (DELTASONE) 10 MG tablet 6,5,4,3,2,1 10/10/17   Argentina Donovan, PA-C  promethazine-dextromethorphan (PROMETHAZINE-DM) 6.25-15 MG/5ML syrup Take 5 mLs by mouth 4 (four) times daily as needed for cough. 10/10/17   Argentina Donovan, PA-C  tiZANidine (ZANAFLEX) 4 MG tablet Take 1 tablet (4 mg total) by mouth at bedtime as needed for muscle spasms. Patient not taking: Reported on 09/14/2017 03/26/17   Pieter Partridge, DO  topiramate (TOPAMAX) 50 MG tablet Take 1 tablet (50 mg total) by mouth at bedtime. Patient not taking: Reported on 09/14/2017 03/26/17   Pieter Partridge, DO  TRUEPLUS LANCETS 26G MISC 1 each by Does not apply route every 8 (eight) hours as needed. 09/18/16   Maren Reamer, MD  varenicline (CHANTIX CONTINUING MONTH PAK) 1 MG tablet Take 1 tablet (1 mg total) by mouth 2 (two) times daily. Patient not taking: Reported on 09/14/2017 07/17/17   Tresa Garter, MD  varenicline (CHANTIX STARTING MONTH PAK) 0.5 MG X 11 & 1 MG X 42 tablet Take one 0.5 mg tablet by mouth once daily for 3 days, then increase to one 0.5 mg tablet twice daily for 4 days, then increase to one 1 mg tablet twice daily. Patient not taking: Reported on 09/14/2017 07/13/17   Alfonse Spruce, FNP     Objective:  EXAM:   Vitals:   10/10/17 0903  BP: (!) 146/84  Pulse: (!) 102  Resp: 16  Temp: 98.3 F (36.8 C)  TempSrc: Oral  SpO2: 100%    Weight: 206 lb 6.4 oz (93.6 kg)    General appearance : A&OX3. NAD. Non-toxic-appearing, cough sounds harsh.   HEENT: Atraumatic and Normocephalic.  PERRLA. EOM intact.  TM full B. Mouth-MMM, post pharynx WNL w/o erythema, No PND. Neck: supple, no JVD. No cervical lymphadenopathy. No thyromegaly Chest/Lungs:  Breathing-non-labored, Good air entry bilaterally, breath sounds normal without rales, rhonchi but here is mild wheezing with forced expiration CVS: S1 S2 regular, no murmurs, gallops, rubs  Extremities: Bilateral Lower Ext shows no edema, both legs are warm to touch with = pulse throughout Neurology:  CN II-XII grossly intact, Non focal.   Psych:  TP linear. J/I WNL. Normal  speech. Appropriate eye contact and affect.  Skin:  No Rash  Data Review Lab Results  Component Value Date   HGBA1C 8.9 09/14/2017   HGBA1C 10.8 06/25/2017   HGBA1C 13.0 03/16/2017     Assessment & Plan   1. Uncontrolled type 2 diabetes mellitus with hyperglycemia (HCC) Watch sugars and diet closely-esp while oin prednisone.  Increase water intake - POCT glucose (manual entry)  2. Bronchitis (unable to use zpack or septra) - predniSONE (DELTASONE) 10 MG tablet; 6,5,4,3,2,1  Dispense: 21 tablet; Refill: 0 - doxycycline (VIBRA-TABS) 100 MG tablet; Take 1 tablet (100 mg total) by mouth 2 (two) times daily.  Dispense: 20 tablet; Refill: 0 - promethazine-dextromethorphan (PROMETHAZINE-DM) 6.25-15 MG/5ML syrup; Take 5 mLs by mouth 4 (four) times daily as needed for cough.  Dispense: 118 mL; Refill: 0 - fluconazole (DIFLUCAN) 150 MG tablet; Take 1 tablet (150 mg total) by mouth once for 1 dose.  Dispense: 1 tablet; Refill: 0 - DG Chest 2 View; Future(due to length of time with cough) Use inhaler as needed  3. Bronchospasm - predniSONE (DELTASONE) 10 MG tablet; 6,5,4,3,2,1  Dispense: 21 tablet; Refill: 0 - DG Chest 2 View; Future  Patient have been counseled extensively about nutrition and  exercise  Return in about 2 months (around 12/08/2017) for assign new PCP for DM, etc.  The patient was given clear instructions to go to ER or return to medical center if symptoms don't improve, worsen or new problems develop. The patient verbalized understanding. The patient was told to call to get lab results if they haven't heard anything in the next week.     Freeman Caldron, PA-C [facility] and [place], [place]   10/10/2017, 9:20 AM

## 2017-10-15 ENCOUNTER — Telehealth: Payer: Self-pay

## 2017-10-15 NOTE — Telephone Encounter (Signed)
Contacted pt to go over Xray results pt is aware and is aware of CT being scheduled for October 22, 2017 @10am  @ [facility] Pt is aware to arrive 15 mins early

## 2017-10-17 ENCOUNTER — Ambulatory Visit: Payer: Self-pay

## 2017-10-22 ENCOUNTER — Ambulatory Visit (HOSPITAL_COMMUNITY)
Admission: RE | Admit: 2017-10-22 | Discharge: 2017-10-22 | Disposition: A | Discharge status: Home / Self Care | Payer: Self-pay | Source: Ambulatory Visit | Attending: Physician Assistant | Admitting: Physician Assistant

## 2017-10-22 DIAGNOSIS — R918 Other nonspecific abnormal finding of lung field: Secondary | ICD-10-CM | POA: Insufficient documentation

## 2017-10-26 ENCOUNTER — Telehealth: Payer: Self-pay

## 2017-10-26 NOTE — Telephone Encounter (Signed)
Contacted pt to go over CT results pt is aware and doesn't have any questions or concerns  

## 2017-12-07 MED FILL — $HUMULIN R 100 UNITS/ML VIA: 100 | 28 days supply | Qty: 10 | Fill #0

## 2017-12-07 MED FILL — ATORVASTATIN CALCIUM 40 MG: 40 | 30 days supply | Qty: 30 | Fill #6

## 2017-12-07 MED FILL — METFORMIN HCL ER 500 MG TAB: 500 | 30 days supply | Qty: 60 | Fill #2

## 2017-12-07 MED FILL — IBUPROFEN 600 MG TABLET: 600 | 10 days supply | Qty: 30 | Fill #0

## 2017-12-07 MED FILL — ALBUTEROL SULFATE HFA 108 (: 108 (90 BAS | 25 days supply | Qty: 18 | Fill #2

## 2017-12-07 MED FILL — $LANTUS SOLOSTAR 100 UNITS/: 100 | 22 days supply | Qty: 18 | Fill #3

## 2017-12-10 ENCOUNTER — Emergency Department (HOSPITAL_COMMUNITY)
Admission: EM | Admit: 2017-12-10 | Discharge: 2017-12-10 | Disposition: A | Discharge status: Home / Self Care | Payer: Self-pay | Attending: Emergency Medicine | Admitting: Emergency Medicine

## 2017-12-10 ENCOUNTER — Other Ambulatory Visit: Payer: Self-pay

## 2017-12-10 ENCOUNTER — Emergency Department (HOSPITAL_COMMUNITY): Payer: Self-pay

## 2017-12-10 ENCOUNTER — Other Ambulatory Visit: Payer: Self-pay | Admitting: Nurse Practitioner

## 2017-12-10 DIAGNOSIS — F17210 Nicotine dependence, cigarettes, uncomplicated: Secondary | ICD-10-CM | POA: Insufficient documentation

## 2017-12-10 DIAGNOSIS — J45909 Unspecified asthma, uncomplicated: Secondary | ICD-10-CM | POA: Insufficient documentation

## 2017-12-10 DIAGNOSIS — E1165 Type 2 diabetes mellitus with hyperglycemia: Secondary | ICD-10-CM | POA: Insufficient documentation

## 2017-12-10 DIAGNOSIS — Z7982 Long term (current) use of aspirin: Secondary | ICD-10-CM | POA: Insufficient documentation

## 2017-12-10 DIAGNOSIS — Z794 Long term (current) use of insulin: Secondary | ICD-10-CM | POA: Insufficient documentation

## 2017-12-10 DIAGNOSIS — Z79899 Other long term (current) drug therapy: Secondary | ICD-10-CM | POA: Insufficient documentation

## 2017-12-10 DIAGNOSIS — H9203 Otalgia, bilateral: Secondary | ICD-10-CM | POA: Insufficient documentation

## 2017-12-10 DIAGNOSIS — M25532 Pain in left wrist: Secondary | ICD-10-CM | POA: Insufficient documentation

## 2017-12-10 DIAGNOSIS — I10 Essential (primary) hypertension: Secondary | ICD-10-CM

## 2017-12-10 MED ORDER — NAPROXEN 500 MG PO TABS: 500.0000 mg | ORAL_TABLET | Freq: Two times a day (BID) | ORAL | 0 refills | Status: DC

## 2017-12-10 MED ORDER — HYDROCHLOROTHIAZIDE 25 MG PO TABS
25.0000 mg | ORAL_TABLET | Freq: Once | ORAL | Status: AC
Start: 2017-12-10 — End: 2017-12-10
  Administered 2017-12-10: 25 mg via ORAL
  Filled 2017-12-10: qty 1

## 2017-12-10 MED ORDER — LISINOPRIL-HYDROCHLOROTHIAZIDE 20-25 MG PO TABS: 1.0000 | ORAL_TABLET | Freq: Every day | ORAL | 0 refills | Status: DC

## 2017-12-10 MED ORDER — NAPROXEN 250 MG PO TABS
500.0000 mg | ORAL_TABLET | Freq: Once | ORAL | Status: AC
  Administered 2017-12-10: 500 mg via ORAL
  Filled 2017-12-10: qty 2

## 2017-12-10 MED ORDER — LISINOPRIL 20 MG PO TABS
20.0000 mg | ORAL_TABLET | Freq: Once | ORAL | Status: AC
  Administered 2017-12-10: 20 mg via ORAL
  Filled 2017-12-10: qty 1

## 2017-12-10 NOTE — ED Notes (Signed)
Patient transported to X-ray 

## 2017-12-10 NOTE — Discharge Instructions (Signed)
°  Tests performed today include: An x-ray of the affected area - does NOT show any broken bones or dislocations.  Vital signs. See below for your results today.   You were given a brace for your left wrist, wear this at all times, including when sleeping. You were additionally given a prescription for Naproxen.  Naproxen is a nonsteroidal anti-inflammatory medication that will help with pain and swelling. Be sure to take this medication as prescribed with food, 1 pill every 12 hours,  It should be taken with food, as it can cause stomach upset, and more seriously, stomach bleeding. Do not take other nonsteroidal anti-inflammatory medications with this such as Advil, Motrin, or Aleve.  You may take Tylenol per over-the-counter dosing with this medication safely We have prescribed you new medication(s) today. Discuss the medications prescribed today with your pharmacist as they can have adverse effects and interactions with your other medicines including over the counter and prescribed medications. Seek medical evaluation if you start to experience new or abnormal symptoms after taking one of these medicines, seek care immediately if you start to experience difficulty breathing, feeling of your throat closing, facial swelling, or rash as these could be indications of a more serious allergic reaction   Follow-up instructions: Please follow-up with your primary care provider or the provided orthopedic physician (bone specialist) if you continue to have significant pain in 1 week. In this case you may have a more severe injury that requires further care.   Return instructions:  Please return if your toes or fingers or hand are tingling, appear gray or blue, or you have severe pain (also elevate the hand and loosen splint or wrap if you were given one) Please return to the Emergency Department if you experience worsening symptoms.  Please return if you have any other emergent concerns. Additional  Information:  Your vital signs today were: BP (!) 156/91 (BP Location: Right Arm)    Pulse 90    Temp 98 F (36.7 C) (Oral)    Resp 16    SpO2 96%  If your blood pressure (BP) was elevated above 135/85 this visit, please have this repeated by your doctor within one month. ---------------
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
## 2017-12-10 NOTE — ED Triage Notes (Signed)
Pt states 3 weeks ago she woke up with her left wrist hurting she felt that maybe she slept on it wrong but it has been hurting ever since. Pt also reports bilateral ear ache.

## 2017-12-11 MED FILL — LISINOPRIL-HCTZ 20-25 MG TA: 20-25 | 30 days supply | Qty: 30 | Fill #0

## 2017-12-17 ENCOUNTER — Ambulatory Visit: Payer: Self-pay | Admitting: Nurse Practitioner

## 2018-02-15 ENCOUNTER — Encounter: Payer: Self-pay | Admitting: Nurse Practitioner

## 2018-02-15 ENCOUNTER — Ambulatory Visit: Payer: Self-pay | Attending: Nurse Practitioner | Admitting: Nurse Practitioner

## 2018-02-15 VITALS — BP 170/84 | HR 89 | Temp 98.8°F | Resp 14 | Ht 64.0 in | Wt 202.8 lb

## 2018-02-15 DIAGNOSIS — E1165 Type 2 diabetes mellitus with hyperglycemia: Secondary | ICD-10-CM | POA: Insufficient documentation

## 2018-02-15 DIAGNOSIS — Z882 Allergy status to sulfonamides status: Secondary | ICD-10-CM | POA: Insufficient documentation

## 2018-02-15 DIAGNOSIS — J45909 Unspecified asthma, uncomplicated: Secondary | ICD-10-CM | POA: Insufficient documentation

## 2018-02-15 DIAGNOSIS — Z881 Allergy status to other antibiotic agents status: Secondary | ICD-10-CM | POA: Insufficient documentation

## 2018-02-15 DIAGNOSIS — Z880 Allergy status to penicillin: Secondary | ICD-10-CM | POA: Insufficient documentation

## 2018-02-15 DIAGNOSIS — Z760 Encounter for issue of repeat prescription: Secondary | ICD-10-CM | POA: Insufficient documentation

## 2018-02-15 DIAGNOSIS — E782 Mixed hyperlipidemia: Secondary | ICD-10-CM | POA: Insufficient documentation

## 2018-02-15 DIAGNOSIS — E1140 Type 2 diabetes mellitus with diabetic neuropathy, unspecified: Secondary | ICD-10-CM | POA: Insufficient documentation

## 2018-02-15 DIAGNOSIS — R55 Syncope and collapse: Secondary | ICD-10-CM | POA: Insufficient documentation

## 2018-02-15 DIAGNOSIS — Z79899 Other long term (current) drug therapy: Secondary | ICD-10-CM | POA: Insufficient documentation

## 2018-02-15 DIAGNOSIS — I10 Essential (primary) hypertension: Secondary | ICD-10-CM | POA: Insufficient documentation

## 2018-02-15 DIAGNOSIS — Z6834 Body mass index (BMI) 34.0-34.9, adult: Secondary | ICD-10-CM | POA: Insufficient documentation

## 2018-02-15 DIAGNOSIS — Z7982 Long term (current) use of aspirin: Secondary | ICD-10-CM | POA: Insufficient documentation

## 2018-02-15 DIAGNOSIS — Z794 Long term (current) use of insulin: Secondary | ICD-10-CM | POA: Insufficient documentation

## 2018-02-15 DIAGNOSIS — J301 Allergic rhinitis due to pollen: Secondary | ICD-10-CM

## 2018-02-15 DIAGNOSIS — Z888 Allergy status to other drugs, medicaments and biological substances status: Secondary | ICD-10-CM | POA: Insufficient documentation

## 2018-02-15 DIAGNOSIS — F329 Major depressive disorder, single episode, unspecified: Secondary | ICD-10-CM | POA: Insufficient documentation

## 2018-02-15 DIAGNOSIS — E669 Obesity, unspecified: Secondary | ICD-10-CM | POA: Insufficient documentation

## 2018-02-15 LAB — POCT URINALYSIS DIPSTICK
Bilirubin, UA: NEGATIVE
Glucose, UA: POSITIVE — AB
Nitrite, UA: NEGATIVE
Protein, UA: POSITIVE — AB
Spec Grav, UA: 1.02 (ref 1.010–1.025)
Urobilinogen, UA: 0.2 E.U./dL
pH, UA: 6 (ref 5.0–8.0)

## 2018-02-15 LAB — POCT GLYCOSYLATED HEMOGLOBIN (HGB A1C): Hemoglobin A1C: 13 % — AB (ref 4.0–5.6)

## 2018-02-15 LAB — GLUCOSE, POCT (MANUAL RESULT ENTRY)
POC Glucose: 359 mg/dl — AB (ref 70–99)
POC Glucose: 338 mg/dl — AB (ref 70–99)

## 2018-02-15 MED ORDER — ASPIRIN 81 MG PO TBEC: 81.0000 mg | DELAYED_RELEASE_TABLET | Freq: Every day | ORAL | 3 refills | Status: DC

## 2018-02-15 MED ORDER — LISINOPRIL-HYDROCHLOROTHIAZIDE 20-25 MG PO TABS: 1.0000 | ORAL_TABLET | Freq: Every day | ORAL | 1 refills | Status: DC

## 2018-02-15 MED ORDER — INSULIN ASPART 100 UNIT/ML FLEXPEN: PEN_INJECTOR | SUBCUTANEOUS | 11 refills | Status: DC

## 2018-02-15 MED ORDER — GABAPENTIN 600 MG PO TABS: 600.0000 mg | ORAL_TABLET | Freq: Three times a day (TID) | ORAL | 3 refills | Status: DC

## 2018-02-15 MED ORDER — CETIRIZINE HCL 10 MG PO TABS: 10.0000 mg | ORAL_TABLET | Freq: Every day | ORAL | 11 refills | Status: DC

## 2018-02-15 MED ORDER — INSULIN PEN NEEDLE 31G X 5 MM MISC: 2 refills | Status: DC

## 2018-02-15 MED ORDER — OLOPATADINE HCL 0.2 % OP SOLN
OPHTHALMIC | 3 refills | Status: DC
Start: 2018-02-15 — End: 2018-05-17

## 2018-02-15 MED ORDER — INSULIN ASPART 100 UNIT/ML ~~LOC~~ SOLN
20.0000 [IU] | Freq: Once | SUBCUTANEOUS | Status: AC
  Administered 2018-02-15: 20 [IU] via SUBCUTANEOUS

## 2018-02-15 MED ORDER — INSULIN GLARGINE 100 UNIT/ML SOLOSTAR PEN: 40.0000 [IU] | PEN_INJECTOR | Freq: Two times a day (BID) | SUBCUTANEOUS | 2 refills | Status: DC

## 2018-02-15 MED ORDER — ATORVASTATIN CALCIUM 40 MG PO TABS: 40.0000 mg | ORAL_TABLET | Freq: Every day | ORAL | 3 refills | Status: DC

## 2018-02-15 MED ORDER — TRUEPLUS LANCETS 26G MISC: 1.0000 | Freq: Three times a day (TID) | 12 refills | Status: DC | PRN

## 2018-02-15 MED ORDER — SODIUM CHLORIDE 0.9 % IV BOLUS
1000.0000 mL | Freq: Once | INTRAVENOUS | Status: AC
  Administered 2018-02-15: 1000 mL via INTRAVENOUS

## 2018-02-15 MED FILL — ATORVASTATIN CALCIUM 40 MG: 40 | 30 days supply | Qty: 30 | Fill #0

## 2018-02-15 MED FILL — GABAPENTIN 600 MG TABLET: 600 | 30 days supply | Qty: 90 | Fill #0

## 2018-02-15 MED FILL — LISINOPRIL-HCTZ 20-25 MG TA: 20-25 | 30 days supply | Qty: 30 | Fill #0

## 2018-02-15 MED FILL — TRUEplus LANCETS 28G MISC: 30 days supply | Qty: 100 | Fill #0

## 2018-02-15 MED FILL — $HUMULIN R 100 UNITS/ML VIA: 100 | 28 days supply | Qty: 10 | Fill #1

## 2018-02-15 MED FILL — $LANTUS SOLOSTAR 100 UNITS/: 100 | 30 days supply | Qty: 24 | Fill #0

## 2018-02-15 MED FILL — !VENTOLIN HFA INHALER: 108 (90 BAS | 25 days supply | Qty: 18 | Fill #3
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
## 2018-02-15 NOTE — Patient Instructions (Signed)
Blood Glucose Monitoring, Adult Monitoring your blood sugar (glucose) helps you manage your diabetes. It also helps you and your health care provider determine how well your diabetes management plan is working. Blood glucose monitoring involves checking your blood glucose as often as directed, and keeping a record (log) of your results over time. Why should I monitor my blood glucose? Checking your blood glucose regularly can:  Help you understand how food, exercise, illnesses, and medicines affect your blood glucose.  Let you know what your blood glucose is at any time. You can quickly tell if you are having low blood glucose (hypoglycemia) or high blood glucose (hyperglycemia).  Help you and your health care provider adjust your medicines as needed.  When should I check my blood glucose? Follow instructions from your health care provider about how often to check your blood glucose. This may depend on:  The type of diabetes you have.  How well-controlled your diabetes is.  Medicines you are taking.  If you have type 1 diabetes:  Check your blood glucose at least 2 times a day.  Also check your blood glucose: ? Before every insulin injection. ? Before and after exercise. ? Between meals. ? 2 hours after a meal. ? Occasionally between 2:00 a.m. and 3:00 a.m., as directed. ? Before potentially dangerous tasks, like driving or using heavy machinery. ? At bedtime.  You may need to check your blood glucose more often, up to 6-10 times a day: ? If you use an insulin pump. ? If you need multiple daily injections (MDI). ? If your diabetes is not well-controlled. ? If you are ill. ? If you have a history of severe hypoglycemia. ? If you have a history of not knowing when your blood glucose is getting low (hypoglycemia unawareness). If you have type 2 diabetes:  If you take insulin or other diabetes medicines, check your blood glucose at least 2 times a day.  If you are on intensive  insulin therapy, check your blood glucose at least 4 times a day. Occasionally, you may also need to check between 2:00 a.m. and 3:00 a.m., as directed.  Also check your blood glucose: ? Before and after exercise. ? Before potentially dangerous tasks, like driving or using heavy machinery.  You may need to check your blood glucose more often if: ? Your medicine is being adjusted. ? Your diabetes is not well-controlled. ? You are ill. What is a blood glucose log?  A blood glucose log is a record of your blood glucose readings. It helps you and your health care provider: ? Look for patterns in your blood glucose over time. ? Adjust your diabetes management plan as needed.  Every time you check your blood glucose, write down your result and notes about things that may be affecting your blood glucose, such as your diet and exercise for the day.  Most glucose meters store a record of glucose readings in the meter. Some meters allow you to download your records to a computer. How do I check my blood glucose? Follow these steps to get accurate readings of your blood glucose: Supplies needed   Blood glucose meter.  Test strips for your meter. Each meter has its own strips. You must use the strips that come with your meter.  A needle to prick your finger (lancet). Do not use lancets more than once.  A device that holds the lancet (lancing device).  A journal or log book to write down your results. Procedure  Wash your hands with soap and water.  Prick the side of your finger (not the tip) with the lancet. Use a different finger each time.  Gently rub the finger until a small drop of blood appears.  Follow instructions that come with your meter for inserting the test strip, applying blood to the strip, and using your blood glucose meter.  Write down your result and any notes. Alternative testing sites  Some meters allow you to use areas of your body other than your finger  (alternative sites) to test your blood.  If you think you may have hypoglycemia, or if you have hypoglycemia unawareness, do not use alternative sites. Use your finger instead.  Alternative sites may not be as accurate as the fingers, because blood flow is slower in these areas. This means that the result you get may be delayed, and it may be different from the result that you would get from your finger.  The most common alternative sites are: ? Forearm. ? Thigh. ? Palm of the hand. Additional tips  Always keep your supplies with you.  If you have questions or need help, all blood glucose meters have a 24-hour "hotline" number that you can call. You may also contact your health care provider.  After you use a few boxes of test strips, adjust (calibrate) your blood glucose meter by following instructions that came with your meter. This information is not intended to replace advice given to you by your health care provider. Make sure you discuss any questions you have with your health care provider. Document Released: 08/17/2003 Document Revised: 03/03/2016 Document Reviewed: 01/24/2016 Elsevier Interactive Patient Education  2017 [place].   Diabetes blood sugar goals  Fasting in AM before breakfast which means at least 8 hrs of no eating or drinking) except water or unsweetened coffee or tea): 90-130 2 hrs after meals: < 180,   Hypoglycemia or low blood sugar: < 70 (You should not have hypoglycemia.)  Aim for 30 minutes of exercise most days. Rethink what you drink. Water is great! Aim for 2-3 Carb Choices per meal (30-45 grams) +/- 1 either way  Aim for 0-15 Carbs per snack if hungry  Include protein in moderation with your meals and snacks  Consider reading food labels for Total Carbohydrate and Fat Grams of foods  Consider checking BG at alternate times per day  Continue taking medication as directed Be mindful about how much sugar you are adding to beverages and  other foods. Fruit Punch - find one with no sugar  Measure and decrease portions of carbohydrate foods  Make your plate and don't go back for seconds

## 2018-02-16 LAB — CBC
Hematocrit: 42 % (ref 34.0–46.6)
Hemoglobin: 14.1 g/dL (ref 11.1–15.9)
MCH: 28.4 pg (ref 26.6–33.0)
MCHC: 33.6 g/dL (ref 31.5–35.7)
MCV: 85 fL (ref 79–97)
Platelets: 348 10*3/uL (ref 150–450)
RBC: 4.97 x10E6/uL (ref 3.77–5.28)
RDW: 13.6 % (ref 12.3–15.4)
WBC: 6 10*3/uL (ref 3.4–10.8)

## 2018-02-16 LAB — CMP14+EGFR
ALT: 13 IU/L (ref 0–32)
AST: 14 IU/L (ref 0–40)
Albumin/Globulin Ratio: 1.3 (ref 1.2–2.2)
Albumin: 4 g/dL (ref 3.5–5.5)
Alkaline Phosphatase: 116 IU/L (ref 39–117)
BUN/Creatinine Ratio: 12 (ref 9–23)
BUN: 11 mg/dL (ref 6–24)
Bilirubin Total: 0.4 mg/dL (ref 0.0–1.2)
CO2: 25 mmol/L (ref 20–29)
Calcium: 9.5 mg/dL (ref 8.7–10.2)
Chloride: 102 mmol/L (ref 96–106)
Creatinine, Ser: 0.95 mg/dL (ref 0.57–1.00)
GFR calc Af Amer: 79 mL/min/{1.73_m2} (ref >59)
GFR calc non Af Amer: 68 mL/min/{1.73_m2} (ref >59)
Globulin, Total: 3 g/dL (ref 1.5–4.5)
Glucose: 396 mg/dL — ABNORMAL HIGH (ref 65–99)
Potassium: 4 mmol/L (ref 3.5–5.2)
Sodium: 141 mmol/L (ref 134–144)
Total Protein: 7 g/dL (ref 6.0–8.5)

## 2018-02-21 ENCOUNTER — Telehealth: Payer: Self-pay | Admitting: Nurse Practitioner

## 2018-02-21 NOTE — Telephone Encounter (Signed)
I called Patient and she is aware of that and she is going to talk to her daughter to pay $4 Thank you

## 2018-02-21 NOTE — Telephone Encounter (Signed)
CMA attempt to call patient to inform she will need to call the pharmacy.  No answer and left VM for patient to call back.

## 2018-02-21 NOTE — Telephone Encounter (Signed)
Patient calling because she lost her medication of  lisinopril-hydrochlorothiazide (PRINZIDE,ZESTORETIC) 20-25 MG tablet she use [facility] pharmacy . Please, call her back

## 2018-03-05 ENCOUNTER — Encounter: Payer: Self-pay | Admitting: Neurology

## 2018-03-05 ENCOUNTER — Ambulatory Visit (INDEPENDENT_AMBULATORY_CARE_PROVIDER_SITE_OTHER): Payer: Self-pay | Admitting: Neurology

## 2018-03-05 VITALS — BP 144/84 | HR 95 | Ht 64.0 in | Wt 196.0 lb

## 2018-03-05 DIAGNOSIS — E1165 Type 2 diabetes mellitus with hyperglycemia: Secondary | ICD-10-CM

## 2018-03-05 DIAGNOSIS — R55 Syncope and collapse: Secondary | ICD-10-CM

## 2018-03-05 DIAGNOSIS — E162 Hypoglycemia, unspecified: Secondary | ICD-10-CM

## 2018-03-05 DIAGNOSIS — R930 Abnormal findings on diagnostic imaging of skull and head, not elsewhere classified: Secondary | ICD-10-CM
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
## 2018-03-05 NOTE — Patient Instructions (Signed)
1.  We will repeat MRI of brain and pituitary with and without contrast 2.  We will order a routine EEG

## 2018-03-11 ENCOUNTER — Ambulatory Visit (INDEPENDENT_AMBULATORY_CARE_PROVIDER_SITE_OTHER): Payer: Self-pay | Admitting: Neurology

## 2018-03-11 DIAGNOSIS — R55 Syncope and collapse: Secondary | ICD-10-CM
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
## 2018-03-20 ENCOUNTER — Telehealth: Payer: Self-pay | Admitting: Neurology

## 2018-03-20 ENCOUNTER — Telehealth: Payer: Self-pay

## 2018-03-20 NOTE — Telephone Encounter (Signed)
Patient returned your call.  Thanks!

## 2018-03-20 NOTE — Telephone Encounter (Signed)
Called Pt, LMOVM advising EEG was normal

## 2018-03-20 NOTE — Telephone Encounter (Signed)
-----   Message from Pieter Partridge, DO sent at 03/12/2018 12:29 PM EDT ----- EEG is normal

## 2018-03-21 ENCOUNTER — Ambulatory Visit
Admission: RE | Admit: 2018-03-21 | Discharge: 2018-03-21 | Disposition: A | Discharge status: Home / Self Care | Payer: Self-pay | Source: Ambulatory Visit | Attending: Neurology | Admitting: Neurology

## 2018-03-21 DIAGNOSIS — R930 Abnormal findings on diagnostic imaging of skull and head, not elsewhere classified: Secondary | ICD-10-CM

## 2018-03-21 DIAGNOSIS — R55 Syncope and collapse: Secondary | ICD-10-CM

## 2018-03-21 MED ORDER — GADOBENATE DIMEGLUMINE 529 MG/ML IV SOLN
10.0000 mL | Freq: Once | INTRAVENOUS | Status: AC | PRN
  Administered 2018-03-21: 10 mL via INTRAVENOUS

## 2018-03-22 ENCOUNTER — Telehealth: Payer: Self-pay

## 2018-03-22 NOTE — Telephone Encounter (Signed)
Called and LMOVM advising Pt of results and to follow up with neurosurgery as planned.

## 2018-03-22 NOTE — Telephone Encounter (Signed)
-----   Message from Pieter Partridge, DO sent at 03/22/2018  7:34 AM EDT ----- It looks like the meningioma hasn't increased in size.  She should still follow up with neurosurgery

## 2018-04-03 ENCOUNTER — Emergency Department (HOSPITAL_BASED_OUTPATIENT_CLINIC_OR_DEPARTMENT_OTHER): Payer: Self-pay

## 2018-04-03 ENCOUNTER — Encounter (HOSPITAL_BASED_OUTPATIENT_CLINIC_OR_DEPARTMENT_OTHER): Payer: Self-pay

## 2018-04-03 ENCOUNTER — Emergency Department (HOSPITAL_BASED_OUTPATIENT_CLINIC_OR_DEPARTMENT_OTHER)
Admission: EM | Admit: 2018-04-03 | Discharge: 2018-04-03 | Disposition: A | Discharge status: Home / Self Care | Payer: Self-pay | Attending: Emergency Medicine | Admitting: Emergency Medicine

## 2018-04-03 ENCOUNTER — Other Ambulatory Visit: Payer: Self-pay

## 2018-04-03 DIAGNOSIS — E119 Type 2 diabetes mellitus without complications: Secondary | ICD-10-CM | POA: Insufficient documentation

## 2018-04-03 DIAGNOSIS — I10 Essential (primary) hypertension: Secondary | ICD-10-CM | POA: Insufficient documentation

## 2018-04-03 DIAGNOSIS — J45909 Unspecified asthma, uncomplicated: Secondary | ICD-10-CM | POA: Insufficient documentation

## 2018-04-03 DIAGNOSIS — F17210 Nicotine dependence, cigarettes, uncomplicated: Secondary | ICD-10-CM | POA: Insufficient documentation

## 2018-04-03 DIAGNOSIS — J189 Pneumonia, unspecified organism: Secondary | ICD-10-CM | POA: Insufficient documentation

## 2018-04-03 DIAGNOSIS — Z9049 Acquired absence of other specified parts of digestive tract: Secondary | ICD-10-CM | POA: Insufficient documentation

## 2018-04-03 DIAGNOSIS — F329 Major depressive disorder, single episode, unspecified: Secondary | ICD-10-CM | POA: Insufficient documentation

## 2018-04-03 DIAGNOSIS — Z794 Long term (current) use of insulin: Secondary | ICD-10-CM | POA: Insufficient documentation

## 2018-04-03 MED ORDER — DOXYCYCLINE HYCLATE 100 MG PO CAPS: 100.0000 mg | ORAL_CAPSULE | Freq: Two times a day (BID) | ORAL | 0 refills | Status: AC

## 2018-04-03 MED ORDER — ALBUTEROL SULFATE HFA 108 (90 BASE) MCG/ACT IN AERS: 1.0000 | INHALATION_SPRAY | Freq: Four times a day (QID) | RESPIRATORY_TRACT | 0 refills | Status: DC | PRN

## 2018-04-03 MED ORDER — DOXYCYCLINE HYCLATE 100 MG PO TABS
100.0000 mg | ORAL_TABLET | Freq: Once | ORAL | Status: AC
  Administered 2018-04-03: 100 mg via ORAL
  Filled 2018-04-03: qty 1

## 2018-04-03 MED FILL — ALBUTEROL SULFATE HFA 108 (: 108 (90 BAS | 30 days supply | Qty: 18 | Fill #0

## 2018-04-03 MED FILL — DOXYCYCLINE HYCLATE 100 MG: 100 | 7 days supply | Qty: 14 | Fill #0

## 2018-04-03 NOTE — ED Triage Notes (Signed)
C/o flu like sx day 2-NAD-steady gait 

## 2018-04-03 NOTE — ED Notes (Signed)
Pt c/o cough that is worse at night for the last two days.  No fever, has not required an antipyretic, last time she used her inhaler was yesterday.  Pt is a smoker.
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
## 2018-04-03 NOTE — Discharge Instructions (Signed)
We believe that your symptoms are caused today by pneumonia, an infection in your lung(s).  Fortunately you should start to improve quickly after taking your antibiotics.  Please take the full course of antibiotics as prescribed and drink plenty of fluids.   ° °Follow up with your doctor within 1-2 days.  If you develop any new or worsening symptoms, including but not limited to fever in spite of taking over-the-counter ibuprofen and/or Tylenol, persistent vomiting, worsening shortness of breath, or other symptoms that concern you, please return to the Emergency Department immediately.  ° ° °Pneumonia °Pneumonia is an infection of the lungs.  °CAUSES °Pneumonia may be caused by bacteria or a virus. Usually, these infections are caused by breathing infectious particles into the lungs (respiratory tract). °SIGNS AND SYMPTOMS  °Cough. °Fever. °Chest pain. °Increased rate of breathing. °Wheezing. °Mucus production. °DIAGNOSIS  °If you have the common symptoms of pneumonia, your health care provider will typically confirm the diagnosis with a chest X-ray. The X-ray will show an abnormality in the lung (pulmonary infiltrate) if you have pneumonia. Other tests of your blood, urine, or sputum may be done to find the specific cause of your pneumonia. Your health care provider may also do tests (blood gases or pulse oximetry) to see how well your lungs are working. °TREATMENT  °Some forms of pneumonia may be spread to other people when you cough or sneeze. You may be asked to wear a mask before and during your exam. Pneumonia that is caused by bacteria is treated with antibiotic medicine. Pneumonia that is caused by the influenza virus may be treated with an antiviral medicine. Most other viral infections must run their course. These infections will not respond to antibiotics.  °HOME CARE INSTRUCTIONS  °Cough suppressants may be used if you are losing too much rest. However, coughing protects you by clearing your lungs. You  should avoid using cough suppressants if you can. °Your health care provider may have prescribed medicine if he or she thinks your pneumonia is caused by bacteria or influenza. Finish your medicine even if you start to feel better. °Your health care provider may also prescribe an expectorant. This loosens the mucus to be coughed up. °Take medicines only as directed by your health care provider. °Do not smoke. Smoking is a common cause of bronchitis and can contribute to pneumonia. If you are a smoker and continue to smoke, your cough may last several weeks after your pneumonia has cleared. °A cold steam vaporizer or humidifier in your room or home may help loosen mucus. °Coughing is often worse at night. Sleeping in a semi-upright position in a recliner or using a couple pillows under your head will help with this. °Get rest as you feel it is needed. Your body will usually let you know when you need to rest. °PREVENTION °A pneumococcal shot (vaccine) is available to prevent a common bacterial cause of pneumonia. This is usually suggested for: °People over 65 years old. °Patients on chemotherapy. °People with chronic lung problems, such as bronchitis or emphysema. °People with immune system problems. °If you are over 65 or have a high risk condition, you may receive the pneumococcal vaccine if you have not received it before. In some countries, a routine influenza vaccine is also recommended. This vaccine can help prevent some cases of pneumonia. You may be offered the influenza vaccine as part of your care. °If you smoke, it is time to quit. You may receive instructions on how to stop smoking. Your   health care provider can provide medicines and counseling to help you quit. °SEEK MEDICAL CARE IF: °You have a fever. °SEEK IMMEDIATE MEDICAL CARE IF:  °Your illness becomes worse. This is especially true if you are elderly or weakened from any other disease. °You cannot control your cough with suppressants and are losing  sleep. °You begin coughing up blood. °You develop pain which is getting worse or is uncontrolled with medicines. °Any of the symptoms which initially brought you in for treatment are getting worse rather than better. °You develop shortness of breath or chest pain. °MAKE SURE YOU:  °Understand these instructions. °Will watch your condition. °Will get help right away if you are not doing well or get worse. °Document Released: 08/14/2005 Document Revised: 12/29/2013 Document Reviewed: 11/03/2010 °ExitCare® Patient Information ©2015 [employer]. This information is not intended to replace advice given to you by your health care provider. Make sure you discuss any questions you have with your health care provider. ° ° ° °

## 2018-04-03 NOTE — ED Notes (Signed)
Pt verbalizes understanding of d/c instructions and denies any further needs at this time. 

## 2018-04-04 MED FILL — !LANTUS SOLOSTAR 100UNITS/M: 100 | 30 days supply | Qty: 24 | Fill #0

## 2018-04-04 MED FILL — LISINOPRIL-HCTZ 20-25 MG TA: 20-25 | 30 days supply | Qty: 30 | Fill #1

## 2018-04-04 MED FILL — GABAPENTIN 600 MG TABLET: 600 | 30 days supply | Qty: 90 | Fill #1

## 2018-04-04 MED FILL — !NOVOLOG FLEXPEN SYRINGE 1: 100/ML | 28 days supply | Qty: 3 | Fill #0

## 2018-05-17 ENCOUNTER — Encounter: Payer: Self-pay | Admitting: Nurse Practitioner

## 2018-05-17 ENCOUNTER — Ambulatory Visit: Payer: Self-pay | Attending: Nurse Practitioner | Admitting: Nurse Practitioner

## 2018-05-17 ENCOUNTER — Other Ambulatory Visit: Payer: Self-pay | Admitting: Pharmacist

## 2018-05-17 VITALS — BP 106/69 | HR 86 | Temp 99.2°F | Ht 64.0 in | Wt 207.4 lb

## 2018-05-17 DIAGNOSIS — Z794 Long term (current) use of insulin: Secondary | ICD-10-CM | POA: Insufficient documentation

## 2018-05-17 DIAGNOSIS — Z833 Family history of diabetes mellitus: Secondary | ICD-10-CM | POA: Insufficient documentation

## 2018-05-17 DIAGNOSIS — G4700 Insomnia, unspecified: Secondary | ICD-10-CM | POA: Insufficient documentation

## 2018-05-17 DIAGNOSIS — J45909 Unspecified asthma, uncomplicated: Secondary | ICD-10-CM | POA: Insufficient documentation

## 2018-05-17 DIAGNOSIS — E785 Hyperlipidemia, unspecified: Secondary | ICD-10-CM | POA: Insufficient documentation

## 2018-05-17 DIAGNOSIS — Z823 Family history of stroke: Secondary | ICD-10-CM | POA: Insufficient documentation

## 2018-05-17 DIAGNOSIS — Z6835 Body mass index (BMI) 35.0-35.9, adult: Secondary | ICD-10-CM | POA: Insufficient documentation

## 2018-05-17 DIAGNOSIS — Z882 Allergy status to sulfonamides status: Secondary | ICD-10-CM | POA: Insufficient documentation

## 2018-05-17 DIAGNOSIS — Z7982 Long term (current) use of aspirin: Secondary | ICD-10-CM | POA: Insufficient documentation

## 2018-05-17 DIAGNOSIS — Z8701 Personal history of pneumonia (recurrent): Secondary | ICD-10-CM | POA: Insufficient documentation

## 2018-05-17 DIAGNOSIS — Z90710 Acquired absence of both cervix and uterus: Secondary | ICD-10-CM | POA: Insufficient documentation

## 2018-05-17 DIAGNOSIS — E1165 Type 2 diabetes mellitus with hyperglycemia: Secondary | ICD-10-CM | POA: Insufficient documentation

## 2018-05-17 DIAGNOSIS — Z79899 Other long term (current) drug therapy: Secondary | ICD-10-CM | POA: Insufficient documentation

## 2018-05-17 DIAGNOSIS — Z881 Allergy status to other antibiotic agents status: Secondary | ICD-10-CM | POA: Insufficient documentation

## 2018-05-17 DIAGNOSIS — Z888 Allergy status to other drugs, medicaments and biological substances status: Secondary | ICD-10-CM | POA: Insufficient documentation

## 2018-05-17 DIAGNOSIS — I10 Essential (primary) hypertension: Secondary | ICD-10-CM | POA: Insufficient documentation

## 2018-05-17 DIAGNOSIS — Z8719 Personal history of other diseases of the digestive system: Secondary | ICD-10-CM | POA: Insufficient documentation

## 2018-05-17 DIAGNOSIS — E669 Obesity, unspecified: Secondary | ICD-10-CM | POA: Insufficient documentation

## 2018-05-17 DIAGNOSIS — E1142 Type 2 diabetes mellitus with diabetic polyneuropathy: Secondary | ICD-10-CM | POA: Insufficient documentation

## 2018-05-17 DIAGNOSIS — Z880 Allergy status to penicillin: Secondary | ICD-10-CM | POA: Insufficient documentation

## 2018-05-17 DIAGNOSIS — Z9049 Acquired absence of other specified parts of digestive tract: Secondary | ICD-10-CM | POA: Insufficient documentation

## 2018-05-17 LAB — POCT GLYCOSYLATED HEMOGLOBIN (HGB A1C): Hemoglobin A1C: 12.4 % — AB (ref 4.0–5.6)

## 2018-05-17 LAB — GLUCOSE, POCT (MANUAL RESULT ENTRY): POC Glucose: 153 mg/dl — AB (ref 70–99)

## 2018-05-17 MED ORDER — OLOPATADINE HCL 0.2 % OP SOLN: OPHTHALMIC | 3 refills | Status: DC

## 2018-05-17 MED ORDER — GLUCOSE BLOOD VI STRP: ORAL_STRIP | 12 refills | Status: DC

## 2018-05-17 MED ORDER — INSULIN LISPRO 100 UNIT/ML (KWIKPEN): PEN_INJECTOR | SUBCUTANEOUS | 11 refills | Status: DC

## 2018-05-17 MED ORDER — LISINOPRIL-HYDROCHLOROTHIAZIDE 20-25 MG PO TABS: 1.0000 | ORAL_TABLET | Freq: Every day | ORAL | 1 refills | Status: DC

## 2018-05-17 MED ORDER — INSULIN ASPART 100 UNIT/ML FLEXPEN: PEN_INJECTOR | SUBCUTANEOUS | 11 refills | Status: DC

## 2018-05-17 MED ORDER — TRAZODONE HCL 100 MG PO TABS: 100.0000 mg | ORAL_TABLET | Freq: Every day | ORAL | 1 refills | Status: DC

## 2018-05-17 MED ORDER — INSULIN GLARGINE 100 UNIT/ML SOLOSTAR PEN: 40.0000 [IU] | PEN_INJECTOR | Freq: Two times a day (BID) | SUBCUTANEOUS | 2 refills | Status: DC

## 2018-05-17 MED FILL — !LANTUS SOLOSTAR 100UNITS/M: 100 | 7 days supply | Qty: 6 | Fill #0

## 2018-05-17 MED FILL — TRUE METRIX TEST STRIP: 25 days supply | Qty: 100 | Fill #0

## 2018-05-17 MED FILL — OLOPATADINE HCL 0.2% EYE DR: 0.2 | 25 days supply | Qty: 3 | Fill #0

## 2018-05-17 MED FILL — traZODone HCL 100 MG TABS: 100 | 30 days supply | Qty: 30 | Fill #0

## 2018-05-17 MED FILL — !HUMALOG 100 UNITS/ML KWIKP: 100 | 30 days supply | Qty: 9 | Fill #0

## 2018-05-17 MED FILL — LISINOPRIL-HCTZ 20-25 MG TA: 20-25 | 30 days supply | Qty: 30 | Fill #0

## 2018-05-17 NOTE — Progress Notes (Signed)
Assessment & Plan:  [name] was seen today for follow-up.  Diagnoses and all orders for this visit:  Uncontrolled type 2 diabetes mellitus with hyperglycemia (HCC) -     Glucose (CBG) -     HgB A1c -     Lipid panel -     Microalbumin/Creatinine Ratio, Urine -     Insulin Glargine (LANTUS) 100 UNIT/ML Solostar Pen; Inject 40 Units into the skin 2 (two) times daily. -     glucose blood test strip; Use as instructed  Essential hypertension -     Lipid panel -     lisinopril-hydrochlorothiazide (PRINZIDE,ZESTORETIC) 20-25 MG tablet; Take 1 tablet by mouth daily.  Insomnia, unspecified type -     traZODone (DESYREL) 100 MG tablet; Take 1 tablet (100 mg total) by mouth at bedtime.  History of pneumonia -     DG Chest 2 View; Future Seen in the ED on 04-03-2018 with cough and congestion. CXR showed developing PNA in the RLL. She was started on antibiotics and repeat cxr PA/LAT was recommended for follow up. Chest xray has been ordered today however financially she is unable to have the CXR performed. Patient has been advised to apply for financial assistance and schedule to see our financial counselor.     Patient has been counseled on age-appropriate routine health concerns for screening and prevention. These are reviewed and up-to-date. Referrals have been placed accordingly. Immunizations are up-to-date or declined.    Subjective:   Chief Complaint  Patient presents with  . Follow-up    Patient is here to follow-up on her diabetes.    HPI Barbara Wang 52 y.o. female presents to office today for f/u to HTN and poorly controlled DM type 2.   Diabetes Mellitus Type 2 Lowest A1c 8.9; highest 13. Weight is also increasing. Today she reports she is not monitoring her blood glucose levels at home. She is not diet compliant or medication compliant. States it is difficult to pay for her medications and sometimes she has "to stretch the medication" to make it last. Patient has  been advised to apply for financial assistance and schedule to see our financial counselor. She endorses blurred vision and peripheral neuropathy. Due for eye exam. Medications include Lantus 40 units BID, gabapentin 613m TID and humalog 10 units TID.  Lab Results  Component Value Date   HGBA1C 12.4 (A) 05/17/2018   Lab Results  Component Value Date   HGBA1C 13.0 (A) 02/15/2018     Essential Hypertension Chronic and well controlled today.  BP Readings from Last 3 Encounters:  05/17/18 106/69  04/03/18 127/77  03/05/18 (!) 144/84   Chest pain: no   Dyspnea: no   Claudication: no  Medication compliance: yes, until she is almost out of her meds  Medication Side Effects  Lightheadedness: no   Urinary frequency: no   Edema: no   Impotence: no  Preventitive Healthcare:  Exercise: no   Diet Pattern: diet: general  Salt Restriction:  no   Hyperlipidemia States she does not take her lipitor as prescribed. She denies any statin intolerance.  Lab Results  Component Value Date   LDLCALC 74 01/22/2016   Insomnia Endorses difficulty staying asleep. She does not drink caffeine. Denies any napping during the day. Wakes up several times at night. She has tried benadryl with no relief of symptoms.    Review of Systems  Constitutional: Negative for fever, malaise/fatigue and weight loss.  HENT: Negative.  Negative for nosebleeds.  Eyes: Positive for blurred vision. Negative for double vision and photophobia.       Bilateral lid edema  Respiratory: Negative.  Negative for cough and shortness of breath.   Cardiovascular: Negative.  Negative for chest pain, palpitations and leg swelling.  Gastrointestinal: Negative.  Negative for heartburn, nausea and vomiting.  Musculoskeletal: Negative.  Negative for myalgias.  Neurological: Negative.  Negative for dizziness, focal weakness, seizures and headaches.  Endo/Heme/Allergies: Positive for environmental allergies.  Psychiatric/Behavioral:  Negative for suicidal ideas. The patient has insomnia.     Past Medical History:  Diagnosis Date  . Asthma   . Brain tumor ([place])   . Cellulitis 06/2015   rt hand   . Depression   . History of hiatal hernia    "it went away on it's own"  . Hyperlipemia   . Hypertension   . Neuropathy     "feet & hands "  . Obesity   . Pancreatitis   . Type II diabetes mellitus (HCC)    insulin dependent     Past Surgical History:  Procedure Laterality Date  . ABDOMINAL HYSTERECTOMY    . [place]; 1987  . CHOLECYSTECTOMY  05/11/2012   Procedure: LAPAROSCOPIC CHOLECYSTECTOMY WITH INTRAOPERATIVE CHOLANGIOGRAM;  Surgeon: Gayland Curry, MD,FACS;  Location: [facility];  Service: General;  Laterality: N/A;  . SHOULDER ARTHROSCOPY W/ ROTATOR CUFF REPAIR Right   . TUBAL LIGATION  10/25/1999   Archie Endo 01/11/2011    Family History  Problem Relation Age of Onset  . Stroke Mother   . Diabetes Father   . Diabetes Sister     Social History Reviewed with no changes to be made today.   Outpatient Medications Prior to Visit  Medication Sig Dispense Refill  . albuterol (PROVENTIL HFA;VENTOLIN HFA) 108 (90 Base) MCG/ACT inhaler Inhale 1-2 puffs into the lungs every 6 (six) hours as needed for wheezing or shortness of breath. 1 Inhaler 0  . aspirin 81 MG EC tablet Take 1 tablet (81 mg total) by mouth daily. 90 tablet 3  . atorvastatin (LIPITOR) 40 MG tablet Take 1 tablet (40 mg total) by mouth daily at 6 PM. 90 tablet 3  . Blood Glucose Monitoring Suppl (TRUE METRIX GO GLUCOSE METER) w/Device KIT 1 each by Does not apply route every 8 (eight) hours as needed. 1 kit 0  . cetirizine (ZYRTEC) 10 MG tablet Take 1 tablet (10 mg total) by mouth daily. 90 tablet 11  . fluticasone (FLONASE) 50 MCG/ACT nasal spray Place 2 sprays into both nostrils 2 (two) times daily as needed for allergies or rhinitis. 16 g 6  . gabapentin (NEURONTIN) 600 MG tablet Take 1 tablet (600 mg total) by mouth 3 (three) times daily.  90 tablet 3  . hydrocortisone cream 0.5 % Apply 1 application topically 2 (two) times daily. 30 g 0  . ibuprofen (ADVIL,MOTRIN) 600 MG tablet Take 1 tablet (600 mg total) by mouth every 8 (eight) hours as needed. 30 tablet 0  . Insulin Pen Needle (B-D UF III MINI PEN NEEDLES) 31G X 5 MM MISC Use as instructed 90 each 2  . topiramate (TOPAMAX) 50 MG tablet Take 1 tablet (50 mg total) by mouth at bedtime. 30 tablet 2  . TRUEPLUS LANCETS 26G MISC 1 each by Does not apply route every 8 (eight) hours as needed. 100 each 12  . glucose blood test strip Use as instructed 100 each 12  . insulin aspart (NOVOLOG FLEXPEN) 100 UNIT/ML FlexPen Inject (10  Units total) into the skin 3 (three) times daily before meals. Do not give insulin if blood sugar <120 USE IN PLACE OF HUMULIN R 15 mL 11  . Insulin Glargine (LANTUS) 100 UNIT/ML Solostar Pen Inject 40 Units into the skin 2 (two) times daily. 90 mL 2  . Olopatadine HCl 0.2 % SOLN Apply 1 (one) drop into each affected eye daily. 2.5 mL 3  . lisinopril-hydrochlorothiazide (PRINZIDE,ZESTORETIC) 20-25 MG tablet Take 1 tablet by mouth daily. 90 tablet 1   No facility-administered medications prior to visit.     Allergies  Allergen Reactions  . Penicillins Hives and Shortness Of Breath    Has patient had a PCN reaction causing immediate rash, facial/tongue/throat swelling, SOB or lightheadedness with hypotension: Yes Has patient had a PCN reaction causing severe rash involving mucus membranes or skin necrosis: No Has patient had a PCN reaction that required hospitalization No Has patient had a PCN reaction occurring within the last 10 years: No If all of the above answers are "NO", then may proceed with Cephalosporin use.  . Azithromycin Hives  . Sulfa Antibiotics Hives  . Tramadol Hives       Objective:    BP 106/69 (BP Location: Left Arm, Patient Position: Sitting, Cuff Size: Normal)   Pulse 86   Temp 99.2 F (37.3 C) (Oral)   Ht 5' 4" (1.626 m)    Wt 207 lb 6.4 oz (94.1 kg)   SpO2 96%   BMI 35.60 kg/m  Wt Readings from Last 3 Encounters:  05/17/18 207 lb 6.4 oz (94.1 kg)  04/03/18 197 lb (89.4 kg)  03/05/18 196 lb (88.9 kg)    Physical Exam  Constitutional: She is oriented to person, place, and time. She appears well-developed and well-nourished. She is cooperative.  HENT:  Head: Normocephalic and atraumatic.  Eyes: EOM are normal.  Neck: Normal range of motion.  Cardiovascular: Normal rate, regular rhythm and normal heart sounds. Exam reveals no gallop and no friction rub.  No murmur heard. Pulmonary/Chest: Effort normal and breath sounds normal. No tachypnea. No respiratory distress. She has no decreased breath sounds. She has no wheezes. She has no rhonchi. She has no rales. She exhibits no tenderness.  Abdominal: Bowel sounds are normal.  Musculoskeletal: Normal range of motion. She exhibits no edema.  Neurological: She is alert and oriented to person, place, and time. Coordination normal.  Skin: Skin is warm and dry.  Psychiatric: She has a normal mood and affect. Her behavior is normal. Judgment and thought content normal.  Nursing note and vitals reviewed.      Patient has been counseled extensively about nutrition and exercise as well as the importance of adherence with medications and regular follow-up. The patient was given clear instructions to go to ER or return to medical center if symptoms don't improve, worsen or new problems develop. The patient verbalized understanding.   Follow-up: Return in about 3 months (around 08/16/2018) for DM/HTN.   Gildardo Pounds, FNP-BC [facility], [place]   05/18/2018, 12:34 AM

## 2018-05-18 LAB — LIPID PANEL
Chol/HDL Ratio: 3.5 ratio (ref 0.0–4.4)
Cholesterol, Total: 133 mg/dL (ref 100–199)
HDL: 38 mg/dL — ABNORMAL LOW (ref >39)
LDL Calculated: 55 mg/dL (ref 0–99)
Triglycerides: 198 mg/dL — ABNORMAL HIGH (ref 0–149)
VLDL Cholesterol Cal: 40 mg/dL (ref 5–40)

## 2018-05-18 LAB — MICROALBUMIN / CREATININE URINE RATIO
Creatinine, Urine: 66.3 mg/dL
Microalb/Creat Ratio: 55.8 mg/g creat — ABNORMAL HIGH (ref 0.0–30.0)
Microalbumin, Urine: 37 ug/mL

## 2018-05-22 ENCOUNTER — Ambulatory Visit (HOSPITAL_COMMUNITY)
Admission: RE | Admit: 2018-05-22 | Discharge: 2018-05-22 | Disposition: A | Discharge status: Home / Self Care | Payer: Self-pay | Source: Ambulatory Visit | Attending: Nurse Practitioner | Admitting: Nurse Practitioner

## 2018-05-22 ENCOUNTER — Telehealth: Payer: Self-pay | Admitting: Nurse Practitioner

## 2018-05-22 DIAGNOSIS — Z8701 Personal history of pneumonia (recurrent): Secondary | ICD-10-CM | POA: Insufficient documentation

## 2018-05-22 DIAGNOSIS — R918 Other nonspecific abnormal finding of lung field: Secondary | ICD-10-CM | POA: Insufficient documentation

## 2018-05-22 NOTE — Telephone Encounter (Signed)
CMA spoke to patient to inform her Paperwork will be ready by Monday.  Patient understood.

## 2018-05-22 NOTE — Telephone Encounter (Signed)
Patient would like to know that status of the paperwork she dropped off on Friday 05/17/18. Please follow up.

## 2018-05-22 NOTE — Telephone Encounter (Signed)
Paper work will be ready on Monday. Turn around time is generally 7-14 business days

## 2018-05-22 NOTE — Telephone Encounter (Signed)
Will route to PCP 

## 2018-05-26 ENCOUNTER — Other Ambulatory Visit: Payer: Self-pay | Admitting: Nurse Practitioner

## 2018-05-26 DIAGNOSIS — R9389 Abnormal findings on diagnostic imaging of other specified body structures: Secondary | ICD-10-CM

## 2018-05-29 NOTE — Progress Notes (Signed)
CMA spoke to patient and scheduled her Chest CT and patient agree to scheduled with no insurance coverage or CAFA.

## 2018-06-07 ENCOUNTER — Ambulatory Visit (HOSPITAL_COMMUNITY)
Admission: RE | Admit: 2018-06-07 | Discharge: 2018-06-07 | Disposition: A | Discharge status: Home / Self Care | Payer: Self-pay | Source: Ambulatory Visit | Attending: Nurse Practitioner | Admitting: Nurse Practitioner

## 2018-06-07 DIAGNOSIS — R9389 Abnormal findings on diagnostic imaging of other specified body structures: Secondary | ICD-10-CM | POA: Insufficient documentation

## 2018-06-07 DIAGNOSIS — R918 Other nonspecific abnormal finding of lung field: Secondary | ICD-10-CM | POA: Insufficient documentation

## 2018-06-17 ENCOUNTER — Emergency Department (HOSPITAL_BASED_OUTPATIENT_CLINIC_OR_DEPARTMENT_OTHER)
Admission: EM | Admit: 2018-06-17 | Discharge: 2018-06-17 | Disposition: A | Discharge status: Home / Self Care | Payer: Self-pay | Attending: Emergency Medicine | Admitting: Emergency Medicine

## 2018-06-17 ENCOUNTER — Other Ambulatory Visit: Payer: Self-pay

## 2018-06-17 ENCOUNTER — Encounter (HOSPITAL_BASED_OUTPATIENT_CLINIC_OR_DEPARTMENT_OTHER): Payer: Self-pay | Admitting: Emergency Medicine

## 2018-06-17 DIAGNOSIS — Z7982 Long term (current) use of aspirin: Secondary | ICD-10-CM | POA: Insufficient documentation

## 2018-06-17 DIAGNOSIS — L600 Ingrowing nail: Secondary | ICD-10-CM | POA: Insufficient documentation

## 2018-06-17 DIAGNOSIS — Z9049 Acquired absence of other specified parts of digestive tract: Secondary | ICD-10-CM | POA: Insufficient documentation

## 2018-06-17 DIAGNOSIS — E119 Type 2 diabetes mellitus without complications: Secondary | ICD-10-CM | POA: Insufficient documentation

## 2018-06-17 DIAGNOSIS — I10 Essential (primary) hypertension: Secondary | ICD-10-CM | POA: Insufficient documentation

## 2018-06-17 DIAGNOSIS — Z79899 Other long term (current) drug therapy: Secondary | ICD-10-CM | POA: Insufficient documentation

## 2018-06-17 DIAGNOSIS — Z794 Long term (current) use of insulin: Secondary | ICD-10-CM | POA: Insufficient documentation

## 2018-06-17 DIAGNOSIS — J45909 Unspecified asthma, uncomplicated: Secondary | ICD-10-CM | POA: Insufficient documentation

## 2018-06-17 DIAGNOSIS — L089 Local infection of the skin and subcutaneous tissue, unspecified: Secondary | ICD-10-CM | POA: Insufficient documentation

## 2018-06-17 DIAGNOSIS — F329 Major depressive disorder, single episode, unspecified: Secondary | ICD-10-CM | POA: Insufficient documentation

## 2018-06-17 DIAGNOSIS — F17210 Nicotine dependence, cigarettes, uncomplicated: Secondary | ICD-10-CM | POA: Insufficient documentation

## 2018-06-17 MED ORDER — DOXYCYCLINE HYCLATE 100 MG PO CAPS: 100.0000 mg | ORAL_CAPSULE | Freq: Two times a day (BID) | ORAL | 0 refills | Status: DC

## 2018-06-17 MED FILL — DOXYCYCLINE HYCLATE 100 MG: 100 | 10 days supply | Qty: 20 | Fill #0

## 2018-06-17 NOTE — Discharge Instructions (Signed)
RETURN TO ER IF ANY REDNESS OR SWELLING OF FOOT, WORSENING DRAINAGE FROM TOE, OR FEVER.

## 2018-06-17 NOTE — ED Triage Notes (Signed)
Pain in right 2nd toe for 1 week.  Last night half the toenail came off and "pus and blood shot everywhere". Swelling and discoloration noted.  Pt is diabetic.
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
## 2018-06-17 NOTE — ED Notes (Addendum)
Pt states last night her toenail on her 2nd toe on R foot  fell off when she touched it, and pus and blood came out.Barbara Wang states that it is tender when touched. Discoloration and swelling noted.

## 2018-06-26 ENCOUNTER — Telehealth: Payer: Self-pay | Admitting: Neurology

## 2018-06-26 MED FILL — LISINOPRIL-HCTZ 20-25 MG TA: 20-25 | 30 days supply | Qty: 30 | Fill #1

## 2018-06-26 MED FILL — $LANTUS SOLOSTAR 100 UNITS/: 100 | 37 days supply | Qty: 30 | Fill #1

## 2018-06-26 MED FILL — OLOPATADINE HCL 0.2% EYE DR: 0.2 | 25 days supply | Qty: 3 | Fill #0

## 2018-06-26 MED FILL — GABAPENTIN 600 MG TABLET: 600 | 30 days supply | Qty: 90 | Fill #2

## 2018-06-26 MED FILL — traZODone HCL 100 MG TABS: 100 | 30 days supply | Qty: 30 | Fill #1

## 2018-06-26 MED FILL — !HUMALOG 100 UNITS/ML KWIKP: 100 | 30 days supply | Qty: 9 | Fill #1

## 2018-06-26 NOTE — Telephone Encounter (Signed)
Following Up on [employer] Paper work [phone]. Thanks

## 2018-06-26 NOTE — Telephone Encounter (Signed)
This is paper work from [employer]. Advised Pt will complete and fax today

## 2018-08-16 ENCOUNTER — Ambulatory Visit: Payer: Self-pay | Admitting: Nurse Practitioner

## 2018-09-04 ENCOUNTER — Telehealth: Payer: Self-pay | Admitting: Nurse Practitioner

## 2018-09-04 NOTE — Telephone Encounter (Signed)
1) Medication(s) Requested (by name): Lisinopril-hydro  Insulin lantus Insulin humalog trazadon ibprofen 2) Pharmacy of Choice:   [facility]

## 2018-09-05 ENCOUNTER — Encounter: Payer: Self-pay | Admitting: Family Medicine

## 2018-09-05 ENCOUNTER — Ambulatory Visit (HOSPITAL_BASED_OUTPATIENT_CLINIC_OR_DEPARTMENT_OTHER): Payer: Self-pay | Admitting: Licensed Clinical Social Worker

## 2018-09-05 ENCOUNTER — Ambulatory Visit: Payer: Self-pay | Attending: Nurse Practitioner | Admitting: Family Medicine

## 2018-09-05 VITALS — BP 133/82 | HR 88 | Temp 98.9°F | Resp 18 | Ht 64.0 in | Wt 197.0 lb

## 2018-09-05 DIAGNOSIS — R300 Dysuria: Secondary | ICD-10-CM

## 2018-09-05 DIAGNOSIS — Z598 Other problems related to housing and economic circumstances: Secondary | ICD-10-CM

## 2018-09-05 DIAGNOSIS — F331 Major depressive disorder, recurrent, moderate: Secondary | ICD-10-CM

## 2018-09-05 DIAGNOSIS — E1165 Type 2 diabetes mellitus with hyperglycemia: Secondary | ICD-10-CM

## 2018-09-05 DIAGNOSIS — F419 Anxiety disorder, unspecified: Secondary | ICD-10-CM

## 2018-09-05 DIAGNOSIS — Z599 Problem related to housing and economic circumstances, unspecified: Secondary | ICD-10-CM

## 2018-09-05 DIAGNOSIS — N3001 Acute cystitis with hematuria: Secondary | ICD-10-CM

## 2018-09-05 LAB — POCT GLYCOSYLATED HEMOGLOBIN (HGB A1C): Hemoglobin A1C: 11.4 % — AB (ref 4.0–5.6)

## 2018-09-05 LAB — POCT URINALYSIS DIP (CLINITEK)
Bilirubin, UA: NEGATIVE
Glucose, UA: 500 mg/dL — AB
Ketones, POC UA: NEGATIVE mg/dL
Nitrite, UA: NEGATIVE
POC PROTEIN,UA: 100 — AB
Spec Grav, UA: 1.015
Urobilinogen, UA: 0.2 U/dL
pH, UA: 7

## 2018-09-05 LAB — GLUCOSE, POCT (MANUAL RESULT ENTRY)
POC Glucose: 430 mg/dL — AB (ref 70–99)
POC Glucose: 388 mg/dL — AB (ref 70–99)

## 2018-09-05 MED ORDER — CIPROFLOXACIN HCL 500 MG PO TABS: 500.0000 mg | ORAL_TABLET | Freq: Two times a day (BID) | ORAL | 0 refills | Status: DC

## 2018-09-05 MED ORDER — INSULIN ASPART 100 UNIT/ML ~~LOC~~ SOLN
15.0000 [IU] | Freq: Once | SUBCUTANEOUS | Status: AC
  Administered 2018-09-05: 15 [IU] via SUBCUTANEOUS

## 2018-09-05 MED FILL — LISINOPRIL-HCTZ 20-25 MG TA: 20-25 | 30 days supply | Qty: 30 | Fill #2

## 2018-09-05 MED FILL — $HUMALOG 100 UNITS/ML KWIKP: 100 | 90 days supply | Qty: 27 | Fill #2

## 2018-09-05 MED FILL — $LANTUS SOLOSTAR 100 UNITS/: 100 | 67 days supply | Qty: 54 | Fill #2

## 2018-09-05 MED FILL — CIPROFLOXACIN HCL 500 MG TA: 500 | 7 days supply | Qty: 14 | Fill #0

## 2018-09-05 NOTE — Progress Notes (Signed)
Subjective:    Patient ID: Barbara Wang, female    DOB: [birth year], 53 y.o.   MRN: [id number]  HPI       53 year old female seen secondary to complaint of 3 days of burning with urination, increased back pain, mild nausea and fatigue which is similar to prior urinary tract infections.  Patient denies any fever or chills.  Patient is diabetic and states that she has been out of her insulin for a few days as well as some of her other medications.  Patient's blood sugar at today's visit was elevated at 430.  Patient also with urinary frequency and increased thirst.  Patient states that she does receive her insulin through a pharmacy program from the insulin manufacturer so she will be able to pick up her insulin today but does not have the finances to pick up some of the other medications.  Past Medical History:  Diagnosis Date  . Asthma   . Brain tumor ([place])   . Cellulitis 06/2015   rt hand   . Depression   . History of hiatal hernia    "it went away on it's own"  . Hyperlipemia   . Hypertension   . Neuropathy     "feet & hands "  . Obesity   . Pancreatitis   . Type II diabetes mellitus (HCC)    insulin dependent    Past Surgical History:  Procedure Laterality Date  . ABDOMINAL HYSTERECTOMY    . [place]; 1987  . CHOLECYSTECTOMY  05/11/2012   Procedure: LAPAROSCOPIC CHOLECYSTECTOMY WITH INTRAOPERATIVE CHOLANGIOGRAM;  Surgeon: Gayland Curry, MD,FACS;  Location: [place];  Service: General;  Laterality: N/A;  . SHOULDER ARTHROSCOPY W/ ROTATOR CUFF REPAIR Right   . TUBAL LIGATION  10/25/1999   Archie Endo 01/11/2011   Family History  Problem Relation Age of Onset  . Stroke Mother   . Diabetes Father   . Diabetes Sister    Social History   Tobacco Use  . Smoking status: Current Every Day Smoker    Packs/day: 1.00    Years: 40.00    Pack years: 40.00    Types: Cigarettes  . Smokeless tobacco: Never Used  Substance Use Topics  . Alcohol use: No  . Drug use:  No   Allergies  Allergen Reactions  . Penicillins Hives and Shortness Of Breath    Has patient had a PCN reaction causing immediate rash, facial/tongue/throat swelling, SOB or lightheadedness with hypotension: Yes Has patient had a PCN reaction causing severe rash involving mucus membranes or skin necrosis: No Has patient had a PCN reaction that required hospitalization No Has patient had a PCN reaction occurring within the last 10 years: No If all of the above answers are "NO", then may proceed with Cephalosporin use.  . Azithromycin Hives  . Erythromycin Base Other (See Comments)    Unknown  . Sulfa Antibiotics Hives  . Tramadol Hives      Review of Systems  Constitutional: Positive for fatigue. Negative for chills and fever.  Respiratory: Positive for cough (occasional non-productive). Negative for shortness of breath.   Gastrointestinal: Positive for nausea. Negative for abdominal pain, constipation and diarrhea.  Genitourinary: Positive for dysuria, flank pain and frequency.  Musculoskeletal: Positive for arthralgias and back pain.  Neurological: Negative for dizziness and headaches.       Objective:   Physical Exam BP 133/82 (BP Location: Left Arm, Patient Position: Sitting, Cuff Size: Normal)   Pulse 88   Temp  98.9 F (37.2 C) (Oral)   Resp 18   Ht 5\' 4"  (1.626 m)   Wt 197 lb (89.4 kg)   SpO2 100%   BMI 33.81 kg/m Nurse's notes and vital signs reviewed General- obese female in no acute distress.  Patient does have somewhat slow movements which she attributes to low back pain Lungs- clear to auscultation bilaterally, no increased work of breathing Cardiovascular-regular rate and rhythm Abdomen-soft, patient with suprapubic discomfort to palpation, no rebound or guarding Back-patient with right CVA tenderness and mild left CVA tenderness, patient with thoracolumbar paraspinous spasm and patient with mild midline lumbosacral tenderness to palpation Psych-normal mood  and judgment      Assessment & Plan:  1. Uncontrolled type 2 diabetes mellitus with hyperglycemia [facility]) Patient reports that her blood sugars are greatly elevated at today's visit as she recently ran out of her insulin but should be able to pick up the insulin at today's visit.  Patient will be given 15 units of regular insulin x1 here in the office.  Patient will also have medical social work consult regarding her inability to pay for her medications. - HgB A1c - Glucose (CBG) - POCT URINALYSIS DIP (CLINITEK)  2. Acute cystitis with hematuria Patient with complaint of burning with urination and patient had urinalysis done which was abnormal showing presence of white blood cells and red blood cells.  Urine will be sent for culture.  Patient with multiple drug allergies therefore patient will be placed on Cipro 500 mg twice daily x7 days for treatment of current infection and patient will be notified if the changes needed in her antibiotic based on culture results. - Urine Culture - ciprofloxacin (CIPRO) 500 MG tablet; Take 1 tablet (500 mg total) by mouth 2 (two) times daily for 7 days.  Dispense: 14 tablet; Refill: 0  3.  Burning with urination Patient with complaint of burning with urination for the past 3 days and patient will have urinalysis done to look for possible urinary tract infection -POCT UA  An After Visit Summary was printed and given to the patient.  Return if symptoms worsen or fail to improve, for 1-2 weeks; schedule PCP f/u of DM.

## 2018-09-06 NOTE — BH Specialist Note (Signed)
Integrated Behavioral Health Follow Up Visit  MRN: [id number] Name: Barbara Wang  Number of [place] Clinician visits: 1/6 Session Start time: 11:45 AM  Session End time: 12:10  Total time: 25 minutes  Type of Service: [place] Interpretor:No. Interpretor Name and Language: N/A  SUBJECTIVE: Barbara Wang is a 53 y.o. female accompanied by self Patient was referred by Dr. Chapman Fitch for depression, anxiety, resources. Patient reports the following symptoms/concerns: Pt reports difficulty managing mental and physical health. She is currently unable to afford medications and feels discouraged about being denied disability twice Duration of problem: Ongoing; Severity of problem: moderate  OBJECTIVE: Mood: Anxious and Depressed and Affect: Appropriate Risk of harm to self or others: No plan to harm self or others Pt scored positive on phq9; however, denies current SI/HI/AVH. Protective factors identified, safety plan discussed, and crisis intervention resources provided  LIFE CONTEXT: Family and Social: Pt is residing with adult daughter School/Work: Pt has been denied disability twice. She does not have any income Self-Care: Pt smokes cigarettes (.5-1.5 ppd) to cope with stressors Life Changes: Pt has difficulty managing mental and physical health triggered by financial strain  GOALS ADDRESSED: Patient will: 1.  Reduce symptoms of: anxiety, depression and stress  2.  Increase knowledge and/or ability of: coping skills and healthy habits  3.  Demonstrate ability to: Increase healthy adjustment to current life circumstances and Increase adequate support systems for patient/family  INTERVENTIONS: Interventions utilized:  Solution-Focused Strategies, Supportive Counseling, Psychoeducation and/or Health Education and Link to [employer] Standardized Assessments completed: GAD-7 and PHQ 2&9 with  C-SSRS  ASSESSMENT: Patient currently experiencing depression and anxiety triggered by difficulty managing mental and physical health. Pt is currently unable to afford medications and feels discouraged about being denied disability twice. She currently resides with her adult daughter.  Patient may benefit from psychoeducation, psychotherapy, and medication management. Pt scored positive on phq9; however, denies current SI/HI/AVH. Protective factors identified, safety plan discussed, and crisis intervention resources provided. Pt agreed to referral to Legal Aid to assist with disability appeal and information on divorce clinics. Pt shared that she has not applied for the blue/orange card due to allowing daughter to use bank account. The application process requires bank statements; however, will not accurately report pt's financial status.   PLAN: 1. Follow up with behavioral health clinician on : Pt was encouraged to contact [facility] if symptoms worsen or fail to improve to schedule behavioral appointments at [facility]. 2. Behavioral recommendations: LCSWA recommends that pt apply healthy coping skills discussed, initiate psychotherapy, and follow up with Legal Aid referral. Pt is encouraged to schedule follow up appointment with [facility]CSWA 3. Referral(s): [place] (In Clinic) and [employer] Resources:  Legal Aid 4. "From scale of 1-10, how likely are you to follow plan?":   Rebekah Chesterfield, LCSW 09/09/2018

## 2018-09-10 LAB — URINE CULTURE

## 2018-09-11 ENCOUNTER — Other Ambulatory Visit: Payer: Self-pay | Admitting: Family Medicine

## 2018-09-11 ENCOUNTER — Telehealth: Payer: Self-pay | Admitting: *Deleted

## 2018-09-11 DIAGNOSIS — N3001 Acute cystitis with hematuria: Secondary | ICD-10-CM

## 2018-09-11 MED ORDER — CIPROFLOXACIN HCL 500 MG PO TABS: 500.0000 mg | ORAL_TABLET | Freq: Two times a day (BID) | ORAL | 0 refills | Status: DC

## 2018-09-11 NOTE — Telephone Encounter (Signed)
Refill request

## 2018-09-11 NOTE — Telephone Encounter (Signed)
Notify patient that a 3 day supply of Cipro 500 mg twice per day was sent to this pharmacy

## 2018-09-11 NOTE — Telephone Encounter (Signed)
-----   Message from Antony Blackbird, MD sent at 09/11/2018  3:18 PM EST ----- Please let patient know that the bacteria that caused her UTI was sensitive to the Cipro that she was prescribed

## 2018-09-11 NOTE — Telephone Encounter (Signed)
Patient verified DOB Patient is aware of cipro being sensitive to UTI bacteria. Patient states she requires 10 days of antibiotics and the 7 days only gives her a week of relief and the symptoms return stronger.

## 2018-09-11 NOTE — Progress Notes (Signed)
Patient ID: Barbara Wang, female   DOB: 1966-06-09, 53 y.o.   MRN: [id number]   See phone encounter in chart. Patient was notified of urine culture results and patient requested an additional 3 days of Cipro as she feels that she needs 10 days of antibiotics

## 2018-09-12 MED FILL — CIPROFLOXACIN HCL 500 MG TA: 500 | 3 days supply | Qty: 6 | Fill #0

## 2018-09-12 NOTE — Telephone Encounter (Signed)
Medications have already been filled and picked up from [place]. No further action is required.

## 2018-09-12 NOTE — Telephone Encounter (Signed)
Medical Assistant left message on patient's home and cell voicemail. Voicemail states to give a call back to [place] with [facility] at [phone]. Patient is aware of additional antibiotics being sent to the pharmacy.

## 2018-09-16 ENCOUNTER — Telehealth: Payer: Self-pay | Admitting: Neurology

## 2018-09-16 NOTE — Telephone Encounter (Signed)
Patient is calling in about paperowrk trying to see if it was faxed. Please call her back at [phone]. Thanks!

## 2018-09-17 NOTE — Telephone Encounter (Signed)
Called and spoke with Pt. She states she requires a letter to the [facility] stating she is ok to drive.   Per Dr. Tomi Likens ok to prepare a letter.   Called Pt and advised her. She asked we fax it to [facility]

## 2018-09-27 ENCOUNTER — Telehealth: Payer: Self-pay | Admitting: Nurse Practitioner

## 2018-09-27 ENCOUNTER — Ambulatory Visit: Payer: Self-pay | Admitting: Nurse Practitioner

## 2018-09-27 DIAGNOSIS — G4700 Insomnia, unspecified: Secondary | ICD-10-CM

## 2018-09-27 MED ORDER — TRAZODONE HCL 100 MG PO TABS: 100.0000 mg | ORAL_TABLET | Freq: Every day | ORAL | 2 refills | Status: DC

## 2018-09-27 MED FILL — traZODone HCL 100 MG TABS: 100 | 30 days supply | Qty: 30 | Fill #0

## 2018-09-27 NOTE — Telephone Encounter (Signed)
1) Medication(s) Requested (by name):traZODone (DESYREL) 100 MG tablet    2) Pharmacy of Choice: [facility] pharmacy   3) Special Requests:   Approved medications will be sent to the pharmacy, we will reach out if there is an issue.  Requests made after 3pm may not be addressed until the following business day!  If a patient is unsure of the name of the medication(s) please note and ask patient to call back when they are able to provide all info, do not send to responsible party until all information is available!

## 2018-11-01 ENCOUNTER — Ambulatory Visit: Payer: Self-pay | Admitting: Nurse Practitioner

## 2018-11-22 MED FILL — traZODone HCL 100 MG TABS: 100 | 30 days supply | Qty: 30 | Fill #0

## 2018-11-22 MED FILL — GABAPENTIN 600 MG TABLET: 600 | 30 days supply | Qty: 90 | Fill #3

## 2018-11-22 MED FILL — LISINOPRIL-HCTZ 20-25 MG TA: 20-25 | 30 days supply | Qty: 30 | Fill #3

## 2018-11-22 MED FILL — $LANTUS SOLOSTAR 100 UNITS/: 100 | 30 days supply | Qty: 24 | Fill #1

## 2018-11-27 ENCOUNTER — Ambulatory Visit: Payer: Self-pay | Admitting: Nurse Practitioner

## 2018-11-27 ENCOUNTER — Ambulatory Visit: Payer: Self-pay | Attending: Nurse Practitioner | Admitting: Physician Assistant

## 2018-11-27 ENCOUNTER — Ambulatory Visit: Payer: Self-pay

## 2018-11-27 ENCOUNTER — Other Ambulatory Visit: Payer: Self-pay

## 2018-11-27 DIAGNOSIS — IMO0001 Reserved for inherently not codable concepts without codable children: Secondary | ICD-10-CM

## 2018-11-27 DIAGNOSIS — E1165 Type 2 diabetes mellitus with hyperglycemia: Secondary | ICD-10-CM

## 2018-11-27 DIAGNOSIS — Z794 Long term (current) use of insulin: Secondary | ICD-10-CM

## 2018-11-27 DIAGNOSIS — H1013 Acute atopic conjunctivitis, bilateral: Secondary | ICD-10-CM

## 2018-11-27 NOTE — Progress Notes (Signed)
Patient ID: Barbara Wang, female   DOB: July 24, 1966, 53 y.o.   MRN: [id number]   Virtual Visit via Telephone Note  I connected with Barbara Wang on 11/27/18 at  1:50 PM EDT by telephone and verified that I am speaking with the correct person using two identifiers.   I discussed the limitations, risks, security and privacy concerns of performing an evaluation and management service by telephone and the availability of in person appointments. I also discussed with the patient that there may be a patient responsible charge related to this service. The patient expressed understanding and agreed to proceed.   History of Present Illness:  I am in my office at [facility].  Covid-19 precautions being taken.   Location of patient:  home Patient Consented to phone visit: yes Participating persons: patient  Time on call:  6 mins. C/o waking with eye puffiness.  patanol drops didn't help.  Also taking zyrtec and flonase.  The puffiness has been daily and gets better throughout the day.  No vision changes.  No eye pain.  Hasn't seen eye doctor in a long time.  Eyes are not red or draining.    Blood sugars running about 300.  Says she is compliant with meds ~80% of the time.    Observations/Objective:  TP linear   Assessment and Plan: 1. Diabetes mellitus, insulin dependent (IDDM), uncontrolled ([name]) Uncontrolled.  Increased compliance with medications and diet.   - Ambulatory referral to Ophthalmology  2. Allergic conjunctivitis of both eyes/eye "puffiness"-no red flags Limit salt, eliminate sugar.  Continue allergy meds.  See opthalmology    Follow Up Instructions: F/up 1 month.  If any changes, emergent s/sx, to ED   I discussed the assessment and treatment plan with the patient. The patient was provided an opportunity to ask questions and all were answered. The patient agreed with the plan and demonstrated an understanding of the instructions.   The patient was advised to call  back or seek an in-person evaluation if the symptoms worsen or if the condition fails to improve as anticipated.  I provided 6 minutes of non-face-to-face time during this encounter.   Barbara Caldron, PA-C

## 2018-12-20 ENCOUNTER — Other Ambulatory Visit: Payer: Self-pay | Admitting: Nurse Practitioner

## 2018-12-20 ENCOUNTER — Telehealth: Payer: Self-pay | Admitting: Nurse Practitioner

## 2018-12-20 MED ORDER — PHENAZOPYRIDINE HCL 100 MG PO TABS: 100.0000 mg | ORAL_TABLET | Freq: Three times a day (TID) | ORAL | 0 refills | Status: DC | PRN

## 2018-12-20 NOTE — Telephone Encounter (Signed)
Advised to go to the ED or UC

## 2018-12-20 NOTE — Telephone Encounter (Signed)
Patients call returned.  Patient identified by name and date of birth.  Patient advised that a urine specimen was needed for antibiotics.  Patient could be sent in a Pyridium prescription for pain by Geryl Rankins.  Patient has an appointment on Monday.  Patient was unsure if the appointment was a tele visit or a in person visit.  Patient is seeing Dr Chapman Fitch on Monday.  Patient was changed from tele visit to in person visit.  Patient acknowledged understanding of advice.

## 2018-12-20 NOTE — Telephone Encounter (Signed)
Patient called stating she is experiencing burning. Please follow up for a possible UTI

## 2018-12-20 NOTE — Telephone Encounter (Signed)
Patients call taken.  Patient identified by name and date of birth.  Patient endorses burning pain upon urination.  Patient also has urinary frequency.  Patient states urine id dark yellow.  Advised patient that Triage would ask and see if medication could be sent or whether she needed a urine sample in order to get antibiotics.  Patient acknowledged understanding of advice.

## 2018-12-22 ENCOUNTER — Other Ambulatory Visit: Payer: Self-pay | Admitting: Nurse Practitioner

## 2018-12-22 NOTE — Telephone Encounter (Signed)
Pyridium was sent

## 2018-12-23 ENCOUNTER — Encounter: Payer: Self-pay | Admitting: Family Medicine

## 2018-12-23 ENCOUNTER — Ambulatory Visit: Payer: Self-pay | Attending: Family Medicine | Admitting: Family Medicine

## 2018-12-23 ENCOUNTER — Other Ambulatory Visit: Payer: Self-pay

## 2018-12-23 VITALS — BP 95/56 | HR 84 | Temp 98.4°F | Ht 64.0 in | Wt 197.4 lb

## 2018-12-23 DIAGNOSIS — R058 Other specified cough: Secondary | ICD-10-CM

## 2018-12-23 DIAGNOSIS — N3001 Acute cystitis with hematuria: Secondary | ICD-10-CM

## 2018-12-23 DIAGNOSIS — F17200 Nicotine dependence, unspecified, uncomplicated: Secondary | ICD-10-CM

## 2018-12-23 DIAGNOSIS — R05 Cough: Secondary | ICD-10-CM

## 2018-12-23 DIAGNOSIS — IMO0001 Reserved for inherently not codable concepts without codable children: Secondary | ICD-10-CM

## 2018-12-23 DIAGNOSIS — Z794 Long term (current) use of insulin: Secondary | ICD-10-CM

## 2018-12-23 DIAGNOSIS — R300 Dysuria: Secondary | ICD-10-CM

## 2018-12-23 DIAGNOSIS — E1165 Type 2 diabetes mellitus with hyperglycemia: Secondary | ICD-10-CM

## 2018-12-23 LAB — POCT GLYCOSYLATED HEMOGLOBIN (HGB A1C): Hemoglobin A1C: 12.7 % — AB (ref 4.0–5.6)

## 2018-12-23 LAB — POCT URINALYSIS DIP (CLINITEK)
Bilirubin, UA: NEGATIVE
Glucose, UA: 500 mg/dL — AB
Ketones, POC UA: NEGATIVE mg/dL
Nitrite, UA: NEGATIVE
POC PROTEIN,UA: 100 — AB
Spec Grav, UA: 1.015
Urobilinogen, UA: 0.2 U/dL
pH, UA: 5.5

## 2018-12-23 MED ORDER — CIPROFLOXACIN HCL 500 MG PO TABS: 500.0000 mg | ORAL_TABLET | Freq: Two times a day (BID) | ORAL | 0 refills | Status: DC

## 2018-12-23 MED FILL — LISINOPRIL-HCTZ 20-25 MG TA: 20-25 | 30 days supply | Qty: 30 | Fill #4

## 2018-12-23 MED FILL — CIPROFLOXACIN HCL 500 MG TA: 500 | 7 days supply | Qty: 14 | Fill #0

## 2018-12-23 NOTE — Patient Instructions (Signed)
You have been prescribed an antibiotic Cipro 500 mg to take twice per day for 7 days to treat a bladder/urinary tract infection. Please also increase your Lantus to 44 units twice per day. Drink plenty of water. Please schedule an appointment in about 4 weeks to meet with the clinical pharmacist for help in controlling your blood sugars-bring your blood sugar diary and your glucometer. Follow-up in about 6 weeks with your primary care provider, Raul Del. Please call or return sooner if you are not feeling well or your blood sugars remain elevated. Obtain Chest Xray in follow-up of your cough and make a plan to stop smoking.

## 2018-12-23 NOTE — Progress Notes (Signed)
Acute Office Visit  Subjective:    Patient ID: Barbara Wang, female    DOB: June 13, 1966, 53 y.o.   MRN: [id number]  CC: Burning with urination/dysuria.   HPI      53 yo diabetic female who presents with complaint of burning with urination. Patient reports 5-6 days of painful urination as well as some increase in low back pain over the past few days. She felt as if she was having a mild fever and chills- not able to stay warm overnight.  Patient also reports that for the past 5-6 days her blood sugars have been higher. She states that  They are usually in the 200's but now elevated in the 300's. She is taking Lantus 40 units twice per day. Patient with uncontrolled diabetes with most recent Hgb A1c of 11.4 in January. She does have some increased thirst as well as urinary frequency and fatigue. She continues to smoke and states that she has had a recurrent non-productive cough for months.   Past Medical History:  Diagnosis Date  . Asthma   . Brain tumor ([place])   . Cellulitis 06/2015   rt hand   . Depression   . History of hiatal hernia    "it went away on it's own"  . Hyperlipemia   . Hypertension   . Neuropathy     "feet & hands "  . Obesity   . Pancreatitis   . Type II diabetes mellitus (H[facility])    insulin dependent     Past Surgical History:  Procedure Laterality Date  . ABDOMINAL HYSTERECTOMY    . [place]; 1987  . CHOLECYSTECTOMY  05/11/2012   Procedure: LAPAROSCOPIC CHOLECYSTECTOMY WITH INTRAOPERATIVE CHOLANGIOGRAM;  Surgeon: Gayland Curry, MD,FACS;  Location: [facility];  Service: General;  Laterality: N/A;  . SHOULDER ARTHROSCOPY W/ ROTATOR CUFF REPAIR Right   . TUBAL LIGATION  10/25/1999   Archie Endo 01/11/2011    Family History  Problem Relation Age of Onset  . Stroke Mother   . Diabetes Father   . Diabetes Sister     Social History   Tobacco Use  . Smoking status: Current Every Day Smoker    Packs/day: 1.00    Years: 40.00    Pack years: 40.00     Types: Cigarettes  . Smokeless tobacco: Never Used  Substance Use Topics  . Alcohol use: No  . Drug use: No    Outpatient Medications Prior to Visit  Medication Sig Dispense Refill  . aspirin 81 MG EC tablet Take 1 tablet (81 mg total) by mouth daily. 90 tablet 3  . atorvastatin (LIPITOR) 40 MG tablet Take 1 tablet (40 mg total) by mouth daily at 6 PM. 90 tablet 3  . Blood Glucose Monitoring Suppl (TRUE METRIX GO GLUCOSE METER) w/Device KIT 1 each by Does not apply route every 8 (eight) hours as needed. 1 kit 0  . cetirizine (ZYRTEC) 10 MG tablet Take 1 tablet (10 mg total) by mouth daily. 90 tablet 11  . fluticasone (FLONASE) 50 MCG/ACT nasal spray Place 2 sprays into both nostrils 2 (two) times daily as needed for allergies or rhinitis. 16 g 6  . fluticasone (FLOVENT HFA) 110 MCG/ACT inhaler     . gabapentin (NEURONTIN) 600 MG tablet Take 1 tablet (600 mg total) by mouth 3 (three) times daily. 90 tablet 3  . glucose blood test strip Use as instructed 100 each 12  . ibuprofen (ADVIL,MOTRIN) 600 MG tablet Take 1 tablet (  600 mg total) by mouth every 8 (eight) hours as needed. 30 tablet 0  . Insulin Glargine (LANTUS) 100 UNIT/ML Solostar Pen Inject 40 Units into the skin 2 (two) times daily. 90 mL 2  . insulin lispro (HUMALOG KWIKPEN) 100 UNIT/ML KiwkPen Inject 10 units total into the skin three times daily before meals. DO not give insulin if blood sugar is <120. 15 mL 11  . lisinopril-hydrochlorothiazide (PRINZIDE,ZESTORETIC) 20-25 MG tablet Take 1 tablet by mouth daily. 90 tablet 1  . loratadine (CLARITIN) 10 MG tablet     . phenazopyridine (PYRIDIUM) 100 MG tablet Take 1 tablet (100 mg total) by mouth 3 (three) times daily as needed for pain. 10 tablet 0  . traZODone (DESYREL) 100 MG tablet Take 1 tablet (100 mg total) by mouth at bedtime. 30 tablet 2  . TRUEPLUS LANCETS 26G MISC 1 each by Does not apply route every 8 (eight) hours as needed. 100 each 12  . albuterol (PROVENTIL  HFA;VENTOLIN HFA) 108 (90 Base) MCG/ACT inhaler Inhale 1-2 puffs into the lungs every 6 (six) hours as needed for wheezing or shortness of breath. 1 Inhaler 0  . albuterol (VENTOLIN HFA) 108 (90 Base) MCG/ACT inhaler     . insulin regular (HUMULIN R) 100 units/mL injection      No facility-administered medications prior to visit.     Allergies  Allergen Reactions  . Penicillins Hives and Shortness Of Breath    Has patient had a PCN reaction causing immediate rash, facial/tongue/throat swelling, SOB or lightheadedness with hypotension: Yes Has patient had a PCN reaction causing severe rash involving mucus membranes or skin necrosis: No Has patient had a PCN reaction that required hospitalization No Has patient had a PCN reaction occurring within the last 10 years: No If all of the above answers are "NO", then may proceed with Cephalosporin use.  . Azithromycin Hives  . Erythromycin Base Other (See Comments)    Unknown  . Sulfa Antibiotics Hives  . Tramadol Hives    Review of Systems  Constitutional: Positive for chills, fever (flet feverish overnight ) and malaise/fatigue.  HENT: Positive for congestion. Negative for sore throat.   Eyes: Negative for blurred vision and double vision.  Respiratory: Positive for cough. Negative for sputum production and shortness of breath.   Cardiovascular: Negative for chest pain and palpitations.  Gastrointestinal: Positive for nausea (mid nausea for a few days). Negative for abdominal pain, constipation, diarrhea and vomiting.  Genitourinary: Positive for dysuria and frequency.  Musculoskeletal: Positive for back pain. Negative for falls.  Skin: Negative for itching and rash.  Neurological: Negative for dizziness and headaches.  Endo/Heme/Allergies: Positive for polydipsia. Does not bruise/bleed easily.       Objective:    Physical Exam  Constitutional: She is oriented to person, place, and time. She appears well-developed and well-nourished.   Overweight for height, obese older female in NAD wearing a mask. She does appear slightly fatigued  Neck: Neck supple. No JVD present.  Cardiovascular: Normal rate and regular rhythm.  Pulmonary/Chest: Effort normal and breath sounds normal.  Abdominal: Soft. She exhibits distension. There is abdominal tenderness (mild suprapubic discomfort to palpation). There is no rebound and no guarding.  Musculoskeletal:        General: Tenderness (bilateral CVA tenderness right greater than left) present. No edema.  Lymphadenopathy:    She has no cervical adenopathy.  Neurological: She is alert and oriented to person, place, and time.  Psychiatric: She has a normal mood and affect.  Her behavior is normal.  Vitals reviewed.   BP (!) 95/56 (BP Location: Right Arm, Patient Position: Sitting, Cuff Size: Large)   Pulse 84   Temp 98.4 F (36.9 C) (Oral)   Ht '5\' 4"'$  (1.626 m)   Wt 197 lb 6.4 oz (89.5 kg)   SpO2 94%   BMI 33.88 kg/m  Wt Readings from Last 3 Encounters:  09/05/18 197 lb (89.4 kg)  06/17/18 200 lb (90.7 kg)  05/17/18 207 lb 6.4 oz (94.1 kg)    Health Maintenance Due  Topic Date Due  . Hepatitis C Screening  1966-06-12  . OPHTHALMOLOGY EXAM  12/14/1973  . MAMMOGRAM  12/14/2013  . COLONOSCOPY  12/14/2013  . FOOT EXAM  09/18/2017       Lab Results  Component Value Date   TSH 0.49 03/26/2017   Lab Results  Component Value Date   WBC 6.0 02/15/2018   HGB 14.1 02/15/2018   HCT 42.0 02/15/2018   MCV 85 02/15/2018   PLT 348 02/15/2018   Lab Results  Component Value Date   NA 141 02/15/2018   K 4.0 02/15/2018   CO2 25 02/15/2018   GLUCOSE 396 (H) 02/15/2018   BUN 11 02/15/2018   CREATININE 0.95 02/15/2018   BILITOT 0.4 02/15/2018   ALKPHOS 116 02/15/2018   AST 14 02/15/2018   ALT 13 02/15/2018   PROT 7.0 02/15/2018   ALBUMIN 4.0 02/15/2018   CALCIUM 9.5 02/15/2018   ANIONGAP 8 05/16/2017   Lab Results  Component Value Date   CHOL 133 05/17/2018   Lab  Results  Component Value Date   HDL 38 (L) 05/17/2018   Lab Results  Component Value Date   LDLCALC 55 05/17/2018   Lab Results  Component Value Date   TRIG 198 (H) 05/17/2018   Lab Results  Component Value Date   CHOLHDL 3.5 05/17/2018   Lab Results  Component Value Date   HGBA1C 11.4 (A) 09/05/2018       Assessment & Plan:  1. Dysuria Patient with complaint of dysuria and back pain. Will obtain UA to look for possible UTI - POCT URINALYSIS DIP (CLINITEK)  2. Diabetes mellitus, insulin dependent (IDDM), uncontrolled ([place]) She has had uncontrolled diabetes and will check Hgb A1c today as well as CMP in follow-up. Patient was asked to increase her Lantus to 44 units twice per day from her current 40 units twice daily. Continue to monitor BS and bring BS diary and glucometer and meet with the clinical pharmacist in about 4 weeks and PCP in 6 weeks in follow-up of DM. - HgB A1c - Comprehensive metabolic panel - Amb Referral to Clinical Pharmacist  3. Acute cystitis with hematuria UA with abnormal findings consistent with UTI. Patient with UTI and urine culture in Jan and based on those results along with patient's multiple antibiotic allergies, RX was sent in for her to take Cipro 500 mg twice per day for 7 days and she should remain well hydrated.  - ciprofloxacin (CIPRO) 500 MG tablet; Take 1 tablet (500 mg total) by mouth 2 (two) times daily for 7 days.  Dispense: 14 tablet; Refill: 0  4. Recurrent cough As she has been a long term smoker, she has been asked to obtain a chest xray but she is also on lisinopril which can cause a chronic cough. Follow-up with PCP in 6 weeks - DG Chest 2 View; Future  5. Tobacco dependence Patient encouraged to decrease tobacco use with goal of complete smoking  cessation  An After Visit Summary was printed and given to the patient.  Return in about 6 weeks (around 02/03/2019) for DM- 4 weeks CPP and 6 weeks with Raul Del.   Antony Blackbird, MD

## 2018-12-23 NOTE — Progress Notes (Signed)
Med refill  Burning with urination, urge to urinate

## 2018-12-24 LAB — COMPREHENSIVE METABOLIC PANEL WITH GFR
ALT: 18 IU/L (ref 0–32)
AST: 17 IU/L (ref 0–40)
Albumin/Globulin Ratio: 1.6 (ref 1.2–2.2)
Albumin: 4.1 g/dL (ref 3.8–4.9)
Alkaline Phosphatase: 121 IU/L — ABNORMAL HIGH (ref 39–117)
BUN/Creatinine Ratio: 24 — ABNORMAL HIGH (ref 9–23)
BUN: 30 mg/dL — ABNORMAL HIGH (ref 6–24)
Bilirubin Total: 0.4 mg/dL (ref 0.0–1.2)
CO2: 26 mmol/L (ref 20–29)
Calcium: 9.8 mg/dL (ref 8.7–10.2)
Chloride: 101 mmol/L (ref 96–106)
Creatinine, Ser: 1.23 mg/dL — ABNORMAL HIGH (ref 0.57–1.00)
GFR calc Af Amer: 57 mL/min/1.73 — ABNORMAL LOW
GFR calc non Af Amer: 49 mL/min/1.73 — ABNORMAL LOW
Globulin, Total: 2.6 g/dL (ref 1.5–4.5)
Glucose: 357 mg/dL — ABNORMAL HIGH (ref 65–99)
Potassium: 4.4 mmol/L (ref 3.5–5.2)
Sodium: 141 mmol/L (ref 134–144)
Total Protein: 6.7 g/dL (ref 6.0–8.5)

## 2018-12-24 NOTE — Telephone Encounter (Signed)
Please address patient mychart concern

## 2018-12-28 ENCOUNTER — Inpatient Hospital Stay (HOSPITAL_BASED_OUTPATIENT_CLINIC_OR_DEPARTMENT_OTHER)
Admission: EM | Admit: 2018-12-28 | Discharge: 2019-01-02 | DRG: 872 | Disposition: A | Discharge status: Home / Self Care | Payer: Self-pay | Attending: Internal Medicine | Admitting: Internal Medicine

## 2018-12-28 ENCOUNTER — Other Ambulatory Visit: Payer: Self-pay

## 2018-12-28 ENCOUNTER — Encounter (HOSPITAL_BASED_OUTPATIENT_CLINIC_OR_DEPARTMENT_OTHER): Payer: Self-pay | Admitting: *Deleted

## 2018-12-28 ENCOUNTER — Emergency Department (HOSPITAL_BASED_OUTPATIENT_CLINIC_OR_DEPARTMENT_OTHER): Payer: Self-pay

## 2018-12-28 DIAGNOSIS — Z880 Allergy status to penicillin: Secondary | ICD-10-CM

## 2018-12-28 DIAGNOSIS — Z885 Allergy status to narcotic agent status: Secondary | ICD-10-CM

## 2018-12-28 DIAGNOSIS — R291 Meningismus: Secondary | ICD-10-CM | POA: Clinically undetermined

## 2018-12-28 DIAGNOSIS — E876 Hypokalemia: Secondary | ICD-10-CM | POA: Diagnosis present

## 2018-12-28 DIAGNOSIS — Z794 Long term (current) use of insulin: Secondary | ICD-10-CM

## 2018-12-28 DIAGNOSIS — E1165 Type 2 diabetes mellitus with hyperglycemia: Secondary | ICD-10-CM | POA: Diagnosis present

## 2018-12-28 DIAGNOSIS — E119 Type 2 diabetes mellitus without complications: Secondary | ICD-10-CM

## 2018-12-28 DIAGNOSIS — R000 Tachycardia, unspecified: Secondary | ICD-10-CM | POA: Diagnosis present

## 2018-12-28 DIAGNOSIS — Z8661 Personal history of infections of the central nervous system: Secondary | ICD-10-CM

## 2018-12-28 DIAGNOSIS — Z6833 Body mass index (BMI) 33.0-33.9, adult: Secondary | ICD-10-CM

## 2018-12-28 DIAGNOSIS — Z90710 Acquired absence of both cervix and uterus: Secondary | ICD-10-CM

## 2018-12-28 DIAGNOSIS — N390 Urinary tract infection, site not specified: Secondary | ICD-10-CM | POA: Diagnosis present

## 2018-12-28 DIAGNOSIS — Z882 Allergy status to sulfonamides status: Secondary | ICD-10-CM

## 2018-12-28 DIAGNOSIS — Z881 Allergy status to other antibiotic agents status: Secondary | ICD-10-CM

## 2018-12-28 DIAGNOSIS — J45909 Unspecified asthma, uncomplicated: Secondary | ICD-10-CM | POA: Diagnosis present

## 2018-12-28 DIAGNOSIS — N179 Acute kidney failure, unspecified: Secondary | ICD-10-CM | POA: Diagnosis present

## 2018-12-28 DIAGNOSIS — E669 Obesity, unspecified: Secondary | ICD-10-CM | POA: Diagnosis present

## 2018-12-28 DIAGNOSIS — R319 Hematuria, unspecified: Secondary | ICD-10-CM

## 2018-12-28 DIAGNOSIS — Z791 Long term (current) use of non-steroidal anti-inflammatories (NSAID): Secondary | ICD-10-CM

## 2018-12-28 DIAGNOSIS — Z9049 Acquired absence of other specified parts of digestive tract: Secondary | ICD-10-CM

## 2018-12-28 DIAGNOSIS — E785 Hyperlipidemia, unspecified: Secondary | ICD-10-CM | POA: Diagnosis present

## 2018-12-28 DIAGNOSIS — I119 Hypertensive heart disease without heart failure: Secondary | ICD-10-CM | POA: Diagnosis present

## 2018-12-28 DIAGNOSIS — Z7982 Long term (current) use of aspirin: Secondary | ICD-10-CM

## 2018-12-28 DIAGNOSIS — N289 Disorder of kidney and ureter, unspecified: Secondary | ICD-10-CM

## 2018-12-28 DIAGNOSIS — N3289 Other specified disorders of bladder: Secondary | ICD-10-CM | POA: Diagnosis present

## 2018-12-28 DIAGNOSIS — N10 Acute tubulo-interstitial nephritis: Secondary | ICD-10-CM | POA: Diagnosis present

## 2018-12-28 DIAGNOSIS — B3741 Candidal cystitis and urethritis: Secondary | ICD-10-CM | POA: Diagnosis present

## 2018-12-28 DIAGNOSIS — Z86011 Personal history of benign neoplasm of the brain: Secondary | ICD-10-CM

## 2018-12-28 DIAGNOSIS — Z79899 Other long term (current) drug therapy: Secondary | ICD-10-CM

## 2018-12-28 DIAGNOSIS — Z823 Family history of stroke: Secondary | ICD-10-CM

## 2018-12-28 DIAGNOSIS — E1142 Type 2 diabetes mellitus with diabetic polyneuropathy: Secondary | ICD-10-CM | POA: Diagnosis not present

## 2018-12-28 DIAGNOSIS — A419 Sepsis, unspecified organism: Principal | ICD-10-CM | POA: Diagnosis present

## 2018-12-28 DIAGNOSIS — Z888 Allergy status to other drugs, medicaments and biological substances status: Secondary | ICD-10-CM

## 2018-12-28 DIAGNOSIS — J019 Acute sinusitis, unspecified: Secondary | ICD-10-CM | POA: Diagnosis present

## 2018-12-28 DIAGNOSIS — F17210 Nicotine dependence, cigarettes, uncomplicated: Secondary | ICD-10-CM | POA: Diagnosis present

## 2018-12-28 DIAGNOSIS — M542 Cervicalgia: Secondary | ICD-10-CM | POA: Diagnosis present

## 2018-12-28 DIAGNOSIS — Z833 Family history of diabetes mellitus: Secondary | ICD-10-CM

## 2018-12-28 DIAGNOSIS — R0902 Hypoxemia: Secondary | ICD-10-CM | POA: Diagnosis present

## 2018-12-28 DIAGNOSIS — Z7951 Long term (current) use of inhaled steroids: Secondary | ICD-10-CM

## 2018-12-28 DIAGNOSIS — I10 Essential (primary) hypertension: Secondary | ICD-10-CM | POA: Diagnosis present

## 2018-12-28 DIAGNOSIS — Z20828 Contact with and (suspected) exposure to other viral communicable diseases: Secondary | ICD-10-CM | POA: Diagnosis present

## 2018-12-28 LAB — COMPREHENSIVE METABOLIC PANEL
ALT: 21 U/L (ref 0–44)
AST: 18 U/L (ref 15–41)
Albumin: 3.6 g/dL (ref 3.5–5.0)
Alkaline Phosphatase: 96 U/L (ref 38–126)
Anion gap: 13 (ref 5–15)
BUN: 30 mg/dL — ABNORMAL HIGH (ref 6–20)
CO2: 24 mmol/L (ref 22–32)
Calcium: 8.9 mg/dL (ref 8.9–10.3)
Chloride: 93 mmol/L — ABNORMAL LOW (ref 98–111)
Creatinine, Ser: 1.44 mg/dL — ABNORMAL HIGH (ref 0.44–1.00)
GFR calc Af Amer: 47 mL/min — ABNORMAL LOW (ref >60)
GFR calc non Af Amer: 41 mL/min — ABNORMAL LOW (ref >60)
Glucose, Bld: 527 mg/dL (ref 70–99)
Potassium: 4.3 mmol/L (ref 3.5–5.1)
Sodium: 130 mmol/L — ABNORMAL LOW (ref 135–145)
Total Bilirubin: 1.5 mg/dL — ABNORMAL HIGH (ref 0.3–1.2)
Total Protein: 7.4 g/dL (ref 6.5–8.1)

## 2018-12-28 LAB — CBC WITH DIFFERENTIAL/PLATELET
Abs Immature Granulocytes: 0.03 10*3/uL (ref 0.00–0.07)
Basophils Absolute: 0.1 10*3/uL (ref 0.0–0.1)
Basophils Relative: 1 %
Eosinophils Absolute: 0 10*3/uL (ref 0.0–0.5)
Eosinophils Relative: 0 %
HCT: 42.3 % (ref 36.0–46.0)
Hemoglobin: 13.7 g/dL (ref 12.0–15.0)
Immature Granulocytes: 0 %
Lymphocytes Relative: 11 %
Lymphs Abs: 1.1 10*3/uL (ref 0.7–4.0)
MCH: 28.4 pg (ref 26.0–34.0)
MCHC: 32.4 g/dL (ref 30.0–36.0)
MCV: 87.6 fL (ref 80.0–100.0)
Monocytes Absolute: 1 10*3/uL (ref 0.1–1.0)
Monocytes Relative: 10 %
Neutro Abs: 7.7 10*3/uL (ref 1.7–7.7)
Neutrophils Relative %: 78 %
Platelets: 298 10*3/uL (ref 150–400)
RBC: 4.83 MIL/uL (ref 3.87–5.11)
RDW: 12.5 % (ref 11.5–15.5)
WBC: 9.9 10*3/uL (ref 4.0–10.5)
nRBC: 0 % (ref 0.0–0.2)

## 2018-12-28 LAB — URINALYSIS, ROUTINE W REFLEX MICROSCOPIC
Glucose, UA: >=500 mg/dL — AB
Ketones, ur: 15 mg/dL — AB
Nitrite: POSITIVE — AB
Protein, ur: 100 mg/dL — AB
Specific Gravity, Urine: 1.02 (ref 1.005–1.030)
pH: 5 (ref 5.0–8.0)

## 2018-12-28 LAB — SARS CORONAVIRUS 2 AG (30 MIN TAT): SARS Coronavirus 2 Ag: NEGATIVE

## 2018-12-28 LAB — URINALYSIS, MICROSCOPIC (REFLEX): WBC, UA: >50 WBC/hpf (ref 0–5)

## 2018-12-28 LAB — LACTIC ACID, PLASMA: Lactic Acid, Venous: 1.4 mmol/L (ref 0.5–1.9)

## 2018-12-28 LAB — LIPASE, BLOOD: Lipase: 27 U/L (ref 11–51)

## 2018-12-28 MED ORDER — SODIUM CHLORIDE 0.9 % IV SOLN
1.0000 g | Freq: Once | INTRAVENOUS | Status: AC
  Administered 2018-12-28: 22:00:00 1 g via INTRAVENOUS
  Filled 2018-12-28: qty 10

## 2018-12-28 MED ORDER — SODIUM CHLORIDE 0.9 % IV BOLUS: 500.0000 mL | Freq: Once | INTRAVENOUS | Status: DC

## 2018-12-28 MED ORDER — ACETAMINOPHEN 325 MG PO TABS
650.0000 mg | ORAL_TABLET | Freq: Once | ORAL | Status: AC
  Administered 2018-12-28: 650 mg via ORAL
  Filled 2018-12-28: qty 2

## 2018-12-28 MED ORDER — SODIUM CHLORIDE 0.9 % IV BOLUS
1000.0000 mL | Freq: Once | INTRAVENOUS | Status: AC
  Administered 2018-12-28: 1000 mL via INTRAVENOUS

## 2018-12-28 MED ORDER — SODIUM CHLORIDE 0.9 % IV BOLUS
1000.0000 mL | Freq: Once | INTRAVENOUS | Status: AC
  Administered 2018-12-28: 22:00:00 1000 mL via INTRAVENOUS

## 2018-12-28 MED ORDER — METOCLOPRAMIDE HCL 5 MG/ML IJ SOLN
10.0000 mg | Freq: Once | INTRAMUSCULAR | Status: AC
  Administered 2018-12-28: 21:00:00 10 mg via INTRAVENOUS
  Filled 2018-12-28: qty 2

## 2018-12-28 MED ORDER — DIPHENHYDRAMINE HCL 50 MG/ML IJ SOLN
25.0000 mg | Freq: Once | INTRAMUSCULAR | Status: AC
  Administered 2018-12-28: 21:00:00 25 mg via INTRAVENOUS
  Filled 2018-12-28: qty 1

## 2018-12-28 NOTE — ED Triage Notes (Signed)
Pt dx with UTI on Monday and has been taking cipro. Reports continued fatigue, headache, aches, and vaginal pain
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
## 2018-12-28 NOTE — ED Notes (Signed)
Lab called and blood sugar is 527. Dr. Regenia Skeeter aware.

## 2018-12-28 NOTE — ED Notes (Signed)
Pt c/o HA.

## 2018-12-28 NOTE — ED Notes (Signed)
Pt difficult stick for blood access. 1st set of cultures sent to lab and antibiotic started to avoid delay.

## 2018-12-28 NOTE — ED Notes (Signed)
Called [facility] - advised that bed was ready for patient.  [facility] 1429, Negative Covid test, no precautions.

## 2018-12-29 ENCOUNTER — Encounter (HOSPITAL_BASED_OUTPATIENT_CLINIC_OR_DEPARTMENT_OTHER): Payer: Self-pay | Admitting: Family Medicine

## 2018-12-29 ENCOUNTER — Other Ambulatory Visit: Payer: Self-pay

## 2018-12-29 DIAGNOSIS — R319 Hematuria, unspecified: Secondary | ICD-10-CM

## 2018-12-29 DIAGNOSIS — N289 Disorder of kidney and ureter, unspecified: Secondary | ICD-10-CM

## 2018-12-29 DIAGNOSIS — N390 Urinary tract infection, site not specified: Secondary | ICD-10-CM | POA: Diagnosis present

## 2018-12-29 LAB — BASIC METABOLIC PANEL
Anion gap: 11 (ref 5–15)
Anion gap: 10 (ref 5–15)
BUN: 29 mg/dL — ABNORMAL HIGH (ref 6–20)
BUN: 26 mg/dL — ABNORMAL HIGH (ref 6–20)
CO2: 25 mmol/L (ref 22–32)
CO2: 26 mmol/L (ref 22–32)
Calcium: 8.8 mg/dL — ABNORMAL LOW (ref 8.9–10.3)
Calcium: 8.7 mg/dL — ABNORMAL LOW (ref 8.9–10.3)
Chloride: 97 mmol/L — ABNORMAL LOW (ref 98–111)
Chloride: 100 mmol/L (ref 98–111)
Creatinine, Ser: 1.23 mg/dL — ABNORMAL HIGH (ref 0.44–1.00)
Creatinine, Ser: 1.06 mg/dL — ABNORMAL HIGH (ref 0.44–1.00)
GFR calc Af Amer: 57 mL/min — ABNORMAL LOW (ref >60)
GFR calc Af Amer: >60 mL/min (ref >60)
GFR calc non Af Amer: 49 mL/min — ABNORMAL LOW (ref >60)
GFR calc non Af Amer: 59 mL/min — ABNORMAL LOW (ref >60)
Glucose, Bld: 584 mg/dL (ref 70–99)
Glucose, Bld: 283 mg/dL — ABNORMAL HIGH (ref 70–99)
Potassium: 3.5 mmol/L (ref 3.5–5.1)
Potassium: 3.4 mmol/L — ABNORMAL LOW (ref 3.5–5.1)
Sodium: 133 mmol/L — ABNORMAL LOW (ref 135–145)
Sodium: 136 mmol/L (ref 135–145)

## 2018-12-29 LAB — GLUCOSE, CAPILLARY
Glucose-Capillary: 465 mg/dL — ABNORMAL HIGH (ref 70–99)
Glucose-Capillary: 500 mg/dL — ABNORMAL HIGH (ref 70–99)
Glucose-Capillary: 411 mg/dL — ABNORMAL HIGH (ref 70–99)
Glucose-Capillary: 319 mg/dL — ABNORMAL HIGH (ref 70–99)
Glucose-Capillary: 283 mg/dL — ABNORMAL HIGH (ref 70–99)
Glucose-Capillary: 222 mg/dL — ABNORMAL HIGH (ref 70–99)
Glucose-Capillary: 186 mg/dL — ABNORMAL HIGH (ref 70–99)

## 2018-12-29 LAB — CBC WITH DIFFERENTIAL/PLATELET
Abs Immature Granulocytes: 0.05 10*3/uL (ref 0.00–0.07)
Basophils Absolute: 0 10*3/uL (ref 0.0–0.1)
Basophils Relative: 1 %
Eosinophils Absolute: 0.1 10*3/uL (ref 0.0–0.5)
Eosinophils Relative: 1 %
HCT: 40.6 % (ref 36.0–46.0)
Hemoglobin: 13.3 g/dL (ref 12.0–15.0)
Immature Granulocytes: 1 %
Lymphocytes Relative: 12 %
Lymphs Abs: 1.1 10*3/uL (ref 0.7–4.0)
MCH: 29.2 pg (ref 26.0–34.0)
MCHC: 32.8 g/dL (ref 30.0–36.0)
MCV: 89 fL (ref 80.0–100.0)
Monocytes Absolute: 1 10*3/uL (ref 0.1–1.0)
Monocytes Relative: 12 %
Neutro Abs: 6.4 10*3/uL (ref 1.7–7.7)
Neutrophils Relative %: 73 %
Platelets: 262 10*3/uL (ref 150–400)
RBC: 4.56 MIL/uL (ref 3.87–5.11)
RDW: 12.6 % (ref 11.5–15.5)
WBC: 8.6 10*3/uL (ref 4.0–10.5)
nRBC: 0 % (ref 0.0–0.2)

## 2018-12-29 LAB — HIV ANTIBODY (ROUTINE TESTING W REFLEX): HIV Screen 4th Generation wRfx: NONREACTIVE

## 2018-12-29 MED ORDER — INSULIN GLARGINE 100 UNIT/ML ~~LOC~~ SOLN
30.0000 [IU] | Freq: Two times a day (BID) | SUBCUTANEOUS | Status: DC
  Administered 2018-12-29: 30 [IU] via SUBCUTANEOUS
  Filled 2018-12-29 (×2): qty 0.3

## 2018-12-29 MED ORDER — LOPERAMIDE HCL 2 MG PO CAPS
4.0000 mg | ORAL_CAPSULE | Freq: Once | ORAL | Status: AC
  Administered 2018-12-29: 4 mg via ORAL
  Filled 2018-12-29: qty 2

## 2018-12-29 MED ORDER — TRAZODONE HCL 100 MG PO TABS
100.0000 mg | ORAL_TABLET | Freq: Every evening | ORAL | Status: DC | PRN
  Administered 2018-12-29 – 2019-01-01 (×4): 100 mg via ORAL
  Filled 2018-12-29 (×4): qty 1

## 2018-12-29 MED ORDER — INSULIN ASPART 100 UNIT/ML ~~LOC~~ SOLN
7.0000 [IU] | Freq: Once | SUBCUTANEOUS | Status: AC
  Administered 2018-12-29: 7 [IU] via SUBCUTANEOUS

## 2018-12-29 MED ORDER — INSULIN ASPART 100 UNIT/ML ~~LOC~~ SOLN
10.0000 [IU] | Freq: Three times a day (TID) | SUBCUTANEOUS | Status: DC
  Administered 2018-12-29 – 2018-12-30 (×6): 10 [IU] via SUBCUTANEOUS

## 2018-12-29 MED ORDER — INSULIN ASPART 100 UNIT/ML ~~LOC~~ SOLN: 6.0000 [IU] | Freq: Three times a day (TID) | SUBCUTANEOUS | Status: DC

## 2018-12-29 MED ORDER — GABAPENTIN 400 MG PO CAPS
400.0000 mg | ORAL_CAPSULE | Freq: Three times a day (TID) | ORAL | Status: DC
  Administered 2018-12-29 – 2019-01-02 (×13): 400 mg via ORAL
  Filled 2018-12-29 (×13): qty 1

## 2018-12-29 MED ORDER — ACETAMINOPHEN 650 MG RE SUPP: 650.0000 mg | Freq: Four times a day (QID) | RECTAL | Status: DC | PRN

## 2018-12-29 MED ORDER — FLUCONAZOLE 100 MG PO TABS
200.0000 mg | ORAL_TABLET | Freq: Once | ORAL | Status: AC
  Administered 2018-12-29: 200 mg via ORAL
  Filled 2018-12-29: qty 2

## 2018-12-29 MED ORDER — INSULIN GLARGINE 100 UNIT/ML ~~LOC~~ SOLN
10.0000 [IU] | Freq: Once | SUBCUTANEOUS | Status: AC
  Administered 2018-12-29: 10 [IU] via SUBCUTANEOUS
  Filled 2018-12-29: qty 0.1

## 2018-12-29 MED ORDER — ALBUTEROL SULFATE (2.5 MG/3ML) 0.083% IN NEBU: 3.0000 mL | INHALATION_SOLUTION | Freq: Four times a day (QID) | RESPIRATORY_TRACT | Status: DC | PRN

## 2018-12-29 MED ORDER — ONDANSETRON HCL 4 MG/2ML IJ SOLN: 4.0000 mg | Freq: Four times a day (QID) | INTRAMUSCULAR | Status: DC | PRN

## 2018-12-29 MED ORDER — SODIUM CHLORIDE 0.9 % IV SOLN
1.0000 g | INTRAVENOUS | Status: DC
  Administered 2018-12-29: 1 g via INTRAVENOUS
  Filled 2018-12-29: qty 1
  Filled 2018-12-29: qty 10

## 2018-12-29 MED ORDER — ONDANSETRON HCL 4 MG PO TABS: 4.0000 mg | ORAL_TABLET | Freq: Four times a day (QID) | ORAL | Status: DC | PRN

## 2018-12-29 MED ORDER — FENTANYL CITRATE (PF) 100 MCG/2ML IJ SOLN
25.0000 ug | INTRAMUSCULAR | Status: DC | PRN
  Administered 2018-12-29 – 2018-12-30 (×2): 25 ug via INTRAVENOUS
  Filled 2018-12-29 (×2): qty 2

## 2018-12-29 MED ORDER — POLYETHYLENE GLYCOL 3350 17 G PO PACK: 17.0000 g | PACK | Freq: Every day | ORAL | Status: DC | PRN

## 2018-12-29 MED ORDER — PHENAZOPYRIDINE HCL 100 MG PO TABS
100.0000 mg | ORAL_TABLET | Freq: Three times a day (TID) | ORAL | Status: DC | PRN
  Administered 2018-12-29 – 2018-12-31 (×2): 100 mg via ORAL
  Filled 2018-12-29 (×4): qty 1

## 2018-12-29 MED ORDER — SODIUM CHLORIDE 0.9 % IV SOLN
INTRAVENOUS | Status: AC
  Administered 2018-12-29: 15:00:00 via INTRAVENOUS

## 2018-12-29 MED ORDER — INSULIN ASPART 100 UNIT/ML ~~LOC~~ SOLN
0.0000 [IU] | Freq: Every day | SUBCUTANEOUS | Status: DC
  Administered 2018-12-30: 3 [IU] via SUBCUTANEOUS
  Administered 2018-12-31: 4 [IU] via SUBCUTANEOUS
  Administered 2019-01-01: 21:00:00 2 [IU] via SUBCUTANEOUS

## 2018-12-29 MED ORDER — ATORVASTATIN CALCIUM 40 MG PO TABS
40.0000 mg | ORAL_TABLET | Freq: Every day | ORAL | Status: DC
  Administered 2018-12-29 – 2019-01-01 (×4): 40 mg via ORAL
  Filled 2018-12-29 (×4): qty 1

## 2018-12-29 MED ORDER — INSULIN GLARGINE 100 UNIT/ML ~~LOC~~ SOLN
40.0000 [IU] | Freq: Two times a day (BID) | SUBCUTANEOUS | Status: DC
  Administered 2018-12-29 – 2018-12-30 (×3): 40 [IU] via SUBCUTANEOUS
  Filled 2018-12-29 (×4): qty 0.4

## 2018-12-29 MED ORDER — ASPIRIN EC 81 MG PO TBEC
81.0000 mg | DELAYED_RELEASE_TABLET | Freq: Every day | ORAL | Status: DC
  Administered 2018-12-29 – 2019-01-02 (×4): 81 mg via ORAL
  Filled 2018-12-29 (×4): qty 1

## 2018-12-29 MED ORDER — SODIUM CHLORIDE 0.9% FLUSH
3.0000 mL | Freq: Two times a day (BID) | INTRAVENOUS | Status: DC
  Administered 2018-12-29 – 2019-01-02 (×8): 3 mL via INTRAVENOUS

## 2018-12-29 MED ORDER — INSULIN ASPART 100 UNIT/ML ~~LOC~~ SOLN
0.0000 [IU] | Freq: Three times a day (TID) | SUBCUTANEOUS | Status: DC
  Administered 2018-12-29: 8 [IU] via SUBCUTANEOUS
  Administered 2018-12-29: 17:00:00 5 [IU] via SUBCUTANEOUS
  Administered 2018-12-29: 08:00:00 15 [IU] via SUBCUTANEOUS
  Administered 2018-12-30: 11 [IU] via SUBCUTANEOUS
  Administered 2018-12-30 – 2019-01-01 (×3): 5 [IU] via SUBCUTANEOUS
  Administered 2019-01-02 (×2): 2 [IU] via SUBCUTANEOUS

## 2018-12-29 MED ORDER — ENOXAPARIN SODIUM 40 MG/0.4ML ~~LOC~~ SOLN
40.0000 mg | Freq: Every day | SUBCUTANEOUS | Status: DC
  Administered 2018-12-29 – 2018-12-31 (×3): 40 mg via SUBCUTANEOUS
  Filled 2018-12-29 (×3): qty 0.4

## 2018-12-29 MED ORDER — GABAPENTIN 600 MG PO TABS: 600.0000 mg | ORAL_TABLET | Freq: Three times a day (TID) | ORAL | Status: DC

## 2018-12-29 MED ORDER — ACETAMINOPHEN 325 MG PO TABS
650.0000 mg | ORAL_TABLET | Freq: Four times a day (QID) | ORAL | Status: DC | PRN
  Administered 2018-12-29 – 2019-01-02 (×6): 650 mg via ORAL
  Filled 2018-12-29 (×7): qty 2

## 2018-12-29 MED ORDER — SODIUM CHLORIDE 0.9 % IV SOLN
INTRAVENOUS | Status: AC
  Administered 2018-12-29: 04:00:00 via INTRAVENOUS

## 2018-12-29 NOTE — ED Notes (Addendum)
Per EDP - no insulin coverage at this time.

## 2018-12-29 NOTE — Progress Notes (Signed)
CRITICAL VALUE STICKER  CRITICAL VALUE: Glucose 584 in AM labs  RECEIVER (on-site recipient of call): Lavella Lemons, RN  DATE & TIME NOTIFIED: 12-29-2018 @ [facility] (representative from lab):   MD NOTIFIED: Opyd, MD  Theod[place]ia: [phone]  RESPONSE: Waiting orders

## 2018-12-29 NOTE — ED Notes (Signed)
[facility] arrived to transport pt [facility] 

## 2018-12-29 NOTE — Plan of Care (Signed)
Patient tolerating carb mod diet, neck pain improved with Tylenol and heating pad.  Patient c/o burning in vaginal area and with urination, Diflucan and Pyridium ordered. Last CBG 222.

## 2018-12-29 NOTE — Progress Notes (Signed)
Blood glucose noted to be 584 on morning labs, finger stick CBG 500.  Notified MD, orders received.

## 2018-12-29 NOTE — Progress Notes (Addendum)
[facility] PROGRESS NOTE  Barbara Wang FXT:[id number] DOB: 22-Aug-1966 DOA: 12/28/2018 PCP: Gildardo Pounds, NP  Assessment/Plan: Poorly controlled diabetic, A1c 12 (3/20) high concern for developing DKA given elevated anion gap and quite elevated glucose on arrival. Patient only given 7 units short acting at outside hospital and reduced dose of her home Lantus.  Will give 15 units of short acting now, and supplement additional 10 units of Lantus to reach her typical home regimen.  Close monitoring of CBGs. Resume home lantus regimen 44 u BID. If gap remains open may need to transfer to stepdown for insulin drip.  UTI, failed outpatient therapy Follow urine culture, continue IV ceftriaxone, likely nidus for worsening control of diabetes. Yeast in urine will add fluconazole  AKI, improving but still not at baseline, likely prerenal in the setting of UTI and volume loss related to hyperglycemia.  Continue IVF. Currently holding home lisinopril/HCTZ  Hypokalemia, follow mag, replete as needed, monitor on BMP.  Hypertension, stable.  Holding home lisinopril/HCTZ.  Will resume once AKI resolved.  Hyperlipidemia, stable.  Continue home atorvastatin.  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   Consultants:  None  Procedures:  None  Antibiotics:  IV ceftriaxone 5/2 (indicate start date, and stop date if known)  HPI/Subjective:  Barbara Wang is a 52 y.o. year old female with medical history significant for for poorly controlled type 2 diabetes mellitus, hypertension, and  hyperlipidemia who presented on 12/28/2018 with evaluation of dysuria, malaise, chills, and headache and was found to have failed outpatient treatment of UTI with ciprofloxacin, redness and headache, and hyperglycemia with elevated anion gap and poorly controlled diabetes  In the ED patient was found to be febrile one 1.3, tachypneic at a rate of 24-28, normal oxygen saturation, tachycardic to 123, and with normotensive blood pressures.  Blood cultures were obtained in the ED and pending.  COVID testing was obtained and was negative.    Lactic acid within normal limits, CBC unremarkable, BMP remarkable for sodium of 130 (corrected 136, anion gap 19), glucose 527, BUN 30, creatinine 1.44  UA showed turbid appearance, glucose greater than 500, ketones present, positive nitrites, trace leukocytes, rare bacteria  Chest x-ray showed cardiomegaly. Patient was started on IV ceftriaxone, given IV Reglan, 1 L bolus of normal saline and accepted to tried hospitalist for admission  This a.m. continues to report burning and vagina No chest pain, no shortness of breath Reports adherence to Lantus regimen  Objective: Vitals:   12/29/18 0132 12/29/18 0641  BP: 122/76 (!) 171/93  Pulse: (!) 118 (!) 120  Resp: 18 18  Temp: 98.1 F (36.7 C) (!) 100.6 F (38.1 C)  SpO2: 92% 92%    Intake/Output Summary (Last 24 hours) at 12/29/2018 0732 Last data filed at 12/29/2018 2992 Gross per 24 hour  Intake 2100 ml  Output 1200 ml  Net 900 ml   Filed Weights   12/28/18 2013 12/29/18 0135  Weight: 89.5 kg 87.9 kg    Exam:   General: Obese female, lying in bed, no distress  Cardiovascular: Regular rate and rhythm, no appreciable murmurs rubs or gallops, no peripheral edema  Respiratory: Normal respiratory effort on  room air  Abdomen: Soft, nondistended, nontender   Musculoskeletal: Normal range of motion  Skin no rashes or lesions  Neurologic alert, no appreciable focal deficits  Data  Reviewed: Basic Metabolic Panel: Recent Labs  Lab 12/23/18 0944 12/28/18 2057 12/29/18 0523  NA 141 130* 133*  K 4.4 4.3 3.5  CL 101 93* 97*  CO2 26 24 25   GLUCOSE 357* 527* 584*  BUN 30* 30* 29*  CREATININE 1.23* 1.44* 1.23*  CALCIUM 9.8 8.9 8.8*   Liver Function Tests: Recent Labs  Lab 12/23/18 0944 12/28/18 2057  AST 17 18  ALT 18 21  ALKPHOS 121* 96  BILITOT 0.4 1.5*  PROT 6.7 7.4  ALBUMIN 4.1 3.6   Recent Labs  Lab 12/28/18 2057  LIPASE 27   No results for input(s): AMMONIA in the last 168 hours. CBC: Recent Labs  Lab 12/28/18 2057 12/29/18 0523  WBC 9.9 8.6  NEUTROABS 7.7 6.4  HGB 13.7 13.3  HCT 42.3 40.6  MCV 87.6 89.0  PLT 298 262   Cardiac Enzymes: No results for input(s): CKTOTAL, CKMB, CKMBINDEX, TROPONINI in the last 168 hours. BNP (last 3 results) No results for input(s): BNP in the last 8760 hours.  ProBNP (last 3 results) No results for input(s): PROBNP in the last 8760 hours.  CBG: Recent Labs  Lab 12/29/18 0725 12/29/18 0731  GLUCAP 465* 500*    Recent Results (from the past 240 hour(s))  SARS Coronavirus 2 (Hosp order,Performed in [facility] lab via [employer] ID)     Status: None   Collection Time: 12/28/18 10:47 PM  Result Value Ref Range Status   SARS Coronavirus 2 ([employer] ID Now) NEGATIVE NEGATIVE Final    Comment: (NOTE) Interpretive Result Comment(s): COVID 19 Positive SARS CoV 2 target nucleic acids are DETECTED. The SARS CoV 2 RNA is generally detectable in upper and lower respiratory specimens during the acute phase of infection.  Positive results are indicative of active infection with SARS CoV 2.  Clinical correlation with patient history and other diagnostic information is necessary to determine patient infection status.  Positive results do not rule out bacterial infection or coinfection with other viruses. The expected result is Negative. COVID 19 Negative SARS CoV 2 target nucleic acids are NOT DETECTED. The  SARS CoV 2 RNA is generally detectable in upper and lower respiratory specimens during the acute phase of infection.  Negative results do not preclude SARS CoV 2 infection, do not rule out coinfections with other pathogens, and should not be used as the sole basis for treatment or other patient management decisions.  Negative results must be combined with clinical  observations, patient history, and epidemiological information. The expected result is Negative. Invalid Presence or absence of SARS CoV 2 nucleic acids cannot be determined. Repeat testing was performed on the submitted specimen and repeated Invalid results were obtained.  If clinically indicated, additional testing on a new specimen with an alternate test methodology [id number]) is advised.  The SARS CoV 2 RNA is generally detectable in upper and lower respiratory specimens during the acute phase of infection. The expected result is Negative. Fact Sheet for Patients:  GolfingFamily.no Fact Sheet for Healthcare Providers: https://www.hernandez-brewer.com/ This test is not yet approved or cleared by the [place] FDA and has been authorized for detection and/or diagnosis of SARS CoV 2 by FDA under an Emergency Use Authorization (EUA).  This EUA will remain in effect (meaning this test can be used) for the duration of the COVID19 d  eclaration under Section 564(b)(1) of the Act, 21 U.S.C. section 513 664 3101 3(b)(1), unless the authorization is terminated or revoked sooner. Performed at [facility], [address] [place], [place] [address]      Studies: Dg Chest Portable 1 View  Result Date: 12/28/2018 CLINICAL DATA:  Cough, fever EXAM: PORTABLE CHEST 1 VIEW COMPARISON:  05/22/2018 FINDINGS: Cardiomegaly.  Lungs clear.  No effusions or acute bony abnormality. IMPRESSION: Mild cardiomegaly.  No active disease. Electronically Signed   By: Rolm Baptise M.D.   On: 12/28/2018 22:09     Scheduled Meds: . aspirin EC  81 mg Oral Daily  . atorvastatin  40 mg Oral q1800  . enoxaparin (LOVENOX) injection  40 mg Subcutaneous Daily  . gabapentin  400 mg Oral TID  . insulin aspart  0-15 Units Subcutaneous TID WC  . insulin aspart  0-5 Units Subcutaneous QHS  . insulin aspart  6 Units Subcutaneous TID WC  . insulin glargine  30 Units Subcutaneous BID  . sodium chloride flush  3 mL Intravenous Q12H   Continuous Infusions: . sodium chloride 100 mL/hr at 12/29/18 0333  . cefTRIAXone (ROCEPHIN)  IV      Principal Problem:   Sepsis secondary to UTI [facility]) Active Problems:   Essential hypertension   Uncontrolled type 2 diabetes mellitus with hyperglycemia ([place])   Mild renal insufficiency      Desiree Hane  [facility]

## 2018-12-29 NOTE — H&P (Signed)
History and Physical    Barbara Wang DOB: 1965-09-19 DOA: 12/28/2018  PCP: Gildardo Pounds, NP   Patient coming from: Home   Chief Complaint: Malaise, dysuria, chills, headache   HPI: Barbara Wang is a 53 y.o. female with medical history significant for poorly controlled type 2 diabetes mellitus, hypertension, and hyperlipidemia, now presenting to the emergency department for evaluation of dysuria, malaise, chills, and headache.  Patient had been in her usual state of health until 1 week ago when she noted dysuria, general malaise, and headache.  She saw her PCP on 12/23/2018, was diagnosed with UTI, and prescribed ciprofloxacin.  Despite taking the antibiotic as directed, she has had persistent malaise, subjective fevers, and intermittent headache.  She also has ongoing dysuria without any gross hematuria or flank pain.  Headache is mainly bitemporal, sometimes involving the occipital region, dull, waxing and waning, and without any neck stiffness, phonophobia, photophobia, or focal neurologic deficit.  [facility] ED Course: Upon arrival to the ED, patient is found to be febrile to 38.5 C, saturating adequately on room air, tachycardic in the 120s, and with blood pressure 110/56.  EKG features sinus tachycardia with rate 127 and chest x-ray is notable for mild cardiomegaly without acute cardiopulmonary disease.  Chemistry panel features a glucose of 527 with normal bicarbonate and anion gap, BUN of 30, and creatinine of 1.44, up from 1.23 a week earlier.  CBC is unremarkable and lactic acid reassuringly normal.  Urinalysis features trace leukocytes, positive nitrites, and >50 wbc/hpf. Blood and urine culture was collected in the ED, 2 L normal saline was administered, patient was given a headache cocktail, and was treated with 1 g IV Rocephin.  She remained stable and transferred to [facility] long hospital was arranged for admission.  Review of Systems:   All other systems reviewed and apart from HPI, are negative.  Past Medical History:  Diagnosis Date  . Asthma   . Brain tumor ([place])   . Cellulitis 06/2015   rt hand   . Depression   . History of hiatal hernia    "it went away on it's own"  . Hyperlipemia   . Hypertension   . Neuropathy     "feet & hands "  . Obesity   . Pancreatitis   . Type II diabetes mellitus ([facility])    insulin dependent     Past Surgical History:  Procedure Laterality Date  . ABDOMINAL HYSTERECTOMY    . [place]; 1987  . CHOLECYSTECTOMY  05/11/2012   Procedure: LAPAROSCOPIC CHOLECYSTECTOMY WITH INTRAOPERATIVE CHOLANGIOGRAM;  Surgeon: Gayland Curry, MD,FACS;  Location: [place];  Service: General;  Laterality: N/A;  . SHOULDER ARTHROSCOPY W/ ROTATOR CUFF REPAIR Right   . TUBAL LIGATION  10/25/1999   Archie Endo 01/11/2011     reports that she has been smoking cigarettes. She has a 40.00 pack-year smoking history. She has never used smokeless tobacco. She reports that she does not drink alcohol or use drugs.  Allergies  Allergen Reactions  . Penicillins Hives and Shortness Of Breath    Has patient had a PCN reaction causing immediate rash, facial/tongue/throat swelling, SOB or lightheadedness with hypotension: Yes Has patient had a PCN reaction causing severe rash involving mucus membranes or skin necrosis: No Has patient had a PCN reaction that required hospitalization No Has patient had a PCN reaction occurring within the last 10 years: No If all of the above answers are "NO", then may proceed with  Cephalosporin use.  . Azithromycin Hives  . Erythromycin Base Other (See Comments)    Unknown  . Sulfa Antibiotics Hives  . Tramadol Hives    Family History  Problem Relation Age of Onset  . Stroke Mother   . Diabetes Father   . Diabetes Sister      Prior to Admission medications   Medication Sig Start Date End Date Taking? Authorizing Provider  albuterol (PROVENTIL HFA;VENTOLIN HFA) 108 (90  Base) MCG/ACT inhaler Inhale 1-2 puffs into the lungs every 6 (six) hours as needed for wheezing or shortness of breath. 04/03/18   Long, Wonda Olds, MD  albuterol (VENTOLIN HFA) 108 (90 Base) MCG/ACT inhaler  04/03/14   [provider]  aspirin 81 MG EC tablet Take 1 tablet (81 mg total) by mouth daily. 02/15/18   Gildardo Pounds, NP  atorvastatin (LIPITOR) 40 MG tablet Take 1 tablet (40 mg total) by mouth daily at 6 PM. 02/15/18   Gildardo Pounds, NP  Blood Glucose Monitoring Suppl (TRUE METRIX GO GLUCOSE METER) w/Device KIT 1 each by Does not apply route every 8 (eight) hours as needed. 09/18/16   Maren Reamer, MD  cetirizine (ZYRTEC) 10 MG tablet Take 1 tablet (10 mg total) by mouth daily. 02/15/18   Gildardo Pounds, NP  ciprofloxacin (CIPRO) 500 MG tablet Take 1 tablet (500 mg total) by mouth 2 (two) times daily for 7 days. 12/23/18 12/30/18  Fulp, Cammie, MD  fluticasone (FLONASE) 50 MCG/ACT nasal spray Place 2 sprays into both nostrils 2 (two) times daily as needed for allergies or rhinitis. 03/16/17   Alfonse Spruce, FNP  fluticasone (FLOVENT HFA) 110 MCG/ACT inhaler  03/22/14   [provider]  gabapentin (NEURONTIN) 600 MG tablet Take 1 tablet (600 mg total) by mouth 3 (three) times daily. 02/15/18   Gildardo Pounds, NP  glucose blood test strip Use as instructed 05/17/18   Gildardo Pounds, NP  ibuprofen (ADVIL,MOTRIN) 600 MG tablet Take 1 tablet (600 mg total) by mouth every 8 (eight) hours as needed. 09/14/17   Alfonse Spruce, FNP  Insulin Glargine (LANTUS) 100 UNIT/ML Solostar Pen Inject 40 Units into the skin 2 (two) times daily. 05/17/18   Gildardo Pounds, NP  insulin lispro (HUMALOG KWIKPEN) 100 UNIT/ML KiwkPen Inject 10 units total into the skin three times daily before meals. DO not give insulin if blood sugar is <120. 05/17/18   Gildardo Pounds, NP  insulin regular (HUMULIN R) 100 units/mL injection  03/22/14   [provider]   lisinopril-hydrochlorothiazide (PRINZIDE,ZESTORETIC) 20-25 MG tablet Take 1 tablet by mouth daily. 05/17/18 12/23/18  Gildardo Pounds, NP  loratadine (CLARITIN) 10 MG tablet  03/22/14   [provider]  phenazopyridine (PYRIDIUM) 100 MG tablet Take 1 tablet (100 mg total) by mouth 3 (three) times daily as needed for pain. 12/20/18   Gildardo Pounds, NP  traZODone (DESYREL) 100 MG tablet Take 1 tablet (100 mg total) by mouth at bedtime. 09/27/18   Gildardo Pounds, NP  TRUEPLUS LANCETS 26G MISC 1 each by Does not apply route every 8 (eight) hours as needed. 02/15/18   Gildardo Pounds, NP    Physical Exam: Vitals:   12/28/18 2330 12/29/18 0015 12/29/18 0030 12/29/18 0132  BP: (!) 135/97  (!) 113/56 122/76  Pulse:  (!) 122  (!) 118  Resp: (!) 22 (!) 21 (!) 25 18  Temp:    98.1 F (36.7 C)  TempSrc:  SpO2:  100% 95% 92%  Weight:      Height:        Constitutional: NAD, calm  Eyes: PERTLA, lids and conjunctivae normal ENMT: Mucous membranes are moist. Posterior pharynx clear of any exudate or lesions.   Neck: normal, supple, no masses, no thyromegaly Respiratory: clear to auscultation bilaterally, no wheezing, no crackles. No accessory muscle use.  Cardiovascular: S1 & S2 heard, regular rate and rhythm. No extremity edema.   Abdomen: No distension, soft. Bowel sounds active.  Musculoskeletal: no clubbing / cyanosis. No joint deformity upper and lower extremities.    Skin: no significant rashes, lesions, ulcers. Warm, dry, well-perfused. Neurologic: CN 2-12 grossly intact. Sensation intact, DTR normal. Strength 5/5 in all 4 limbs.  Psychiatric:  Alert and oriented x 3. Calm, cooperative.    Labs on Admission: I have personally reviewed following labs and imaging studies  CBC: Recent Labs  Lab 12/28/18 2057  WBC 9.9  NEUTROABS 7.7  HGB 13.7  HCT 42.3  MCV 87.6  PLT 419   Basic Metabolic Panel: Recent Labs  Lab 12/23/18 0944 12/28/18 2057  NA 141 130*  K 4.4  4.3  CL 101 93*  CO2 26 24  GLUCOSE 357* 527*  BUN 30* 30*  CREATININE 1.23* 1.44*  CALCIUM 9.8 8.9   GFR: Estimated Creatinine Clearance: 47.8 mL/min (A) (by C-G formula based on SCr of 1.44 mg/dL (H)). Liver Function Tests: Recent Labs  Lab 12/23/18 0944 12/28/18 2057  AST 17 18  ALT 18 21  ALKPHOS 121* 96  BILITOT 0.4 1.5*  PROT 6.7 7.4  ALBUMIN 4.1 3.6   Recent Labs  Lab 12/28/18 2057  LIPASE 27   No results for input(s): AMMONIA in the last 168 hours. Coagulation Profile: No results for input(s): INR, PROTIME in the last 168 hours. Cardiac Enzymes: No results for input(s): CKTOTAL, CKMB, CKMBINDEX, TROPONINI in the last 168 hours. BNP (last 3 results) No results for input(s): PROBNP in the last 8760 hours. HbA1C: No results for input(s): HGBA1C in the last 72 hours. CBG: No results for input(s): GLUCAP in the last 168 hours. Lipid Profile: No results for input(s): CHOL, HDL, LDLCALC, TRIG, CHOLHDL, LDLDIRECT in the last 72 hours. Thyroid Function Tests: No results for input(s): TSH, T4TOTAL, FREET4, T3FREE, THYROIDAB in the last 72 hours. Anemia Panel: No results for input(s): VITAMINB12, FOLATE, FERRITIN, TIBC, IRON, RETICCTPCT in the last 72 hours. Urine analysis:    Component Value Date/Time   COLORURINE ORANGE (A) 12/28/2018 2057   APPEARANCEUR TURBID (A) 12/28/2018 2057   LABSPEC 1.020 12/28/2018 2057   PHURINE 5.0 12/28/2018 2057   GLUCOSEU >=500 (A) 12/28/2018 2057   HGBUR TRACE (A) 12/28/2018 2057   BILIRUBINUR SMALL (A) 12/28/2018 2057   BILIRUBINUR negative 12/23/2018 0921   BILIRUBINUR Negative 02/15/2018 1147   KETONESUR 15 (A) 12/28/2018 2057   PROTEINUR 100 (A) 12/28/2018 2057   UROBILINOGEN 0.2 12/23/2018 0921   UROBILINOGEN 0.2 01/15/2016 1524   NITRITE POSITIVE (A) 12/28/2018 2057   LEUKOCYTESUR TRACE (A) 12/28/2018 2057   Sepsis Labs: '@LABRCNTIP'$ (procalcitonin:4,lacticidven:4) ) Recent Results (from the past 240 hour(s))  SARS  Coronavirus 2 (Hosp order,Performed in [place] lab via [employer] ID)     Status: None   Collection Time: 12/28/18 10:47 PM  Result Value Ref Range Status   SARS Coronavirus 2 ([facility] ID Now) NEGATIVE NEGATIVE Final    Comment: (NOTE) Interpretive Result Comment(s): COVID 19 Positive SARS CoV 2 target nucleic acids are DETECTED. The  SARS CoV 2 RNA is generally detectable in upper and lower respiratory specimens during the acute phase of infection.  Positive results are indicative of active infection with SARS CoV 2.  Clinical correlation with patient history and other diagnostic information is necessary to determine patient infection status.  Positive results do not rule out bacterial infection or coinfection with other viruses. The expected result is Negative. COVID 19 Negative SARS CoV 2 target nucleic acids are NOT DETECTED. The SARS CoV 2 RNA is generally detectable in upper and lower respiratory specimens during the acute phase of infection.  Negative results do not preclude SARS CoV 2 infection, do not rule out coinfections with other pathogens, and should not be used as the sole basis for treatment or other patient management decisions.  Negative results must be combined with clinical  observations, patient history, and epidemiological information. The expected result is Negative. Invalid Presence or absence of SARS CoV 2 nucleic acids cannot be determined. Repeat testing was performed on the submitted specimen and repeated Invalid results were obtained.  If clinically indicated, additional testing on a new specimen with an alternate test methodology [phone]) is advised.  The SARS CoV 2 RNA is generally detectable in upper and lower respiratory specimens during the acute phase of infection. The expected result is Negative. Fact Sheet for Patients:  GolfingFamily.no Fact Sheet for Healthcare Providers: https://www.hernandez-brewer.com/  This test is not yet approved or cleared by the [place] FDA and has been authorized for detection and/or diagnosis of SARS CoV 2 by FDA under an Emergency Use Authorization (EUA).  This EUA will remain in effect (meaning this test can be used) for the duration of the COVID19 d eclaration under Section 564(b)(1) of the Act, 21 U.S.C. section [phone] 3(b)(1), unless the authorization is terminated or revoked sooner. Performed at [facility], [address] [place], [place] [address]      Radiological Exams on Admission: Dg Chest Portable 1 View  Result Date: 12/28/2018 CLINICAL DATA:  Cough, fever EXAM: PORTABLE CHEST 1 VIEW COMPARISON:  05/22/2018 FINDINGS: Cardiomegaly.  Lungs clear.  No effusions or acute bony abnormality. IMPRESSION: Mild cardiomegaly.  No active disease. Electronically Signed   By: Rolm Baptise M.D.   On: 12/28/2018 22:09    EKG: Independently reviewed. Sinus tachycardia (rate 127).   Assessment/Plan   1. UTI  - Presents with persistent dysuria and fevers despite taking ciprofloxacin for the past several days   - She is found to be febrile and tachycardic with reassuringly normal lactate and UA consistent with UTI  - Blood and urine culture collected in ED, 2 liters NS bolus was given, and she was started on empiric Rocephin  - Continue Rocephin while following cultures and clinical course    2. Uncontrolled IDDM  - A1c was 12.7% in April 2020  - Serum glucose is 527 in ED without DKA  - Continue basal and short-acting insulin    3. Mild renal insufficiency  - SCr is 1.44, up from 1.23 a week ago and 0.9 a year ago; there is no flank pain or hematuria  - Renally-dose medications, hold lisinopril, continue IVF hydration, and repeat chem panel in am    4. Hypertension  - BP low-normal range in ED and antihypertensives held initially     PPE: Mask, face shield  DVT prophylaxis: Lovenox  Code Status: Full  Family Communication: Discussed  with patient  Consults called: None Admission status: Observation     Christia Reading  Criss Rosales, MD [facility] Pager [phone]  If 7PM-7AM, please contact night-coverage [email or web address] Password [email or web address]  12/29/2018, 2:28 AM

## 2018-12-29 NOTE — Progress Notes (Signed)
Pt voided multiple times since arrival at 0230. Post void bladder scan done with 273ml volume. 83ml voided since arrival. Opyd, MD aware. I/O cath ordered for bladder scan volume >320ml.

## 2018-12-29 NOTE — Progress Notes (Signed)
FSBS 537 upon arrival. Opyd, MD notified. 7 units Novolog given as ordered.

## 2018-12-30 ENCOUNTER — Inpatient Hospital Stay (HOSPITAL_COMMUNITY): Payer: Self-pay

## 2018-12-30 DIAGNOSIS — N179 Acute kidney failure, unspecified: Secondary | ICD-10-CM

## 2018-12-30 DIAGNOSIS — R000 Tachycardia, unspecified: Secondary | ICD-10-CM

## 2018-12-30 DIAGNOSIS — B3741 Candidal cystitis and urethritis: Secondary | ICD-10-CM

## 2018-12-30 LAB — BASIC METABOLIC PANEL
Anion gap: 8 (ref 5–15)
BUN: 26 mg/dL — ABNORMAL HIGH (ref 6–20)
CO2: 25 mmol/L (ref 22–32)
Calcium: 8.7 mg/dL — ABNORMAL LOW (ref 8.9–10.3)
Chloride: 102 mmol/L (ref 98–111)
Creatinine, Ser: 1.1 mg/dL — ABNORMAL HIGH (ref 0.44–1.00)
GFR calc Af Amer: >60 mL/min (ref >60)
GFR calc non Af Amer: 56 mL/min — ABNORMAL LOW (ref >60)
Glucose, Bld: 399 mg/dL — ABNORMAL HIGH (ref 70–99)
Potassium: 3.7 mmol/L (ref 3.5–5.1)
Sodium: 135 mmol/L (ref 135–145)

## 2018-12-30 LAB — URINE CULTURE: Culture: NO GROWTH

## 2018-12-30 LAB — GLUCOSE, CAPILLARY
Glucose-Capillary: 537 mg/dL (ref 70–99)
Glucose-Capillary: 339 mg/dL — ABNORMAL HIGH (ref 70–99)
Glucose-Capillary: 217 mg/dL — ABNORMAL HIGH (ref 70–99)
Glucose-Capillary: 266 mg/dL — ABNORMAL HIGH (ref 70–99)

## 2018-12-30 LAB — CBC
HCT: 38.8 % (ref 36.0–46.0)
Hemoglobin: 12.4 g/dL (ref 12.0–15.0)
MCH: 28.6 pg (ref 26.0–34.0)
MCHC: 32 g/dL (ref 30.0–36.0)
MCV: 89.6 fL (ref 80.0–100.0)
Platelets: 292 10*3/uL (ref 150–400)
RBC: 4.33 MIL/uL (ref 3.87–5.11)
RDW: 12.5 % (ref 11.5–15.5)
WBC: 8.3 10*3/uL (ref 4.0–10.5)
nRBC: 0 % (ref 0.0–0.2)

## 2018-12-30 LAB — TSH: TSH: 0.533 u[IU]/mL (ref 0.350–4.500)

## 2018-12-30 MED ORDER — HYDROCHLOROTHIAZIDE 25 MG PO TABS
25.0000 mg | ORAL_TABLET | Freq: Every day | ORAL | Status: DC
  Administered 2018-12-30 – 2019-01-02 (×4): 25 mg via ORAL
  Filled 2018-12-30 (×4): qty 1

## 2018-12-30 MED ORDER — HYDRALAZINE HCL 20 MG/ML IJ SOLN: 5.0000 mg | Freq: Four times a day (QID) | INTRAMUSCULAR | Status: DC | PRN

## 2018-12-30 MED ORDER — METOPROLOL TARTRATE 5 MG/5ML IV SOLN
5.0000 mg | Freq: Once | INTRAVENOUS | Status: AC
  Administered 2018-12-30: 5 mg via INTRAVENOUS
  Filled 2018-12-30: qty 5

## 2018-12-30 MED ORDER — FLUCONAZOLE 100 MG PO TABS
200.0000 mg | ORAL_TABLET | Freq: Every day | ORAL | Status: DC
  Administered 2018-12-30 – 2019-01-02 (×4): 200 mg via ORAL
  Filled 2018-12-30 (×4): qty 2

## 2018-12-30 NOTE — Progress Notes (Addendum)
[facility] PROGRESS NOTE  Barbara Wang JXB:[id number] DOB: 09/18/1965 DOA: 12/28/2018 PCP: Gildardo Pounds, NP  Assessment/Plan: Poorly controlled diabetic, A1c 12 (3/20) Concern for development of DKA given anion gap is closed.  Patient still having really high fasting blood glucose.  Will increase to Lantus 44 units twice daily, continue sliding scale closely monitor CBGs.  Funguria in diabetic with concern for yeast cystitis Positive nitrites, rare bacteria, budding yeast present, urine culture unremarkable.  Will discontinue ceftriaxone and continue fluconazole therapy for 14 days given yeast present in UA, especially given T2DM.    AKI, improving but still not quite at baseline, likely prerenal in the setting of UTI and volume loss related to hyperglycemia.  Completed timed IV fluids, will continue to closely monitor.  We will continue to hold home lisinopril.  Ultrasound shows no hydronephrosis.  Tachycardia Persistent since admission, will evaluate with EKG, check TSH just for completion.  May be related to some pain, minimal improvement with IV fluids and does not look dehydrated on exam.  Do not think this related to sepsis as infectious presentation.  Continue to closely monitor  Head and neck pain, seems consistent with musculoskeletal given able to reproduce with palpation of trapezius muscles.  No nuchal rigidity.  Remains afebrile.  Will closely monitor.  Of note patient does have previous history of viral meningitis  Hypokalemia, follow mag, replete as needed, monitor on BMP.  Hypertension, slightly elevated while holding home lisinopril/HCTZ.  Will resume HCTZ.  Hyperlipidemia, stable.  Continue home atorvastatin.  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 year old female with medical history significant for for poorly controlled type 2 diabetes mellitus, hypertension, and hyperlipidemia who presented on 12/28/2018 with evaluation of dysuria, malaise, chills, and headache and was found to have failed outpatient treatment of UTI with ciprofloxacin, redness and headache, and hyperglycemia with elevated anion gap and poorly controlled diabetes  In the ED patient was found to be febrile one 1.3, tachypneic at a rate of 24-28, normal oxygen saturation, tachycardic to 123, and with normotensive blood pressures.  Blood cultures were obtained in the ED and pending.  COVID testing was obtained and was negative.    Lactic acid within normal limits, CBC unremarkable, BMP remarkable for sodium of 130 (corrected 136, anion gap 19), glucose 527, BUN 30, creatinine 1.44  UA showed turbid appearance, glucose greater than 500, ketones present, positive nitrites, trace leukocytes, rare bacteria  Chest x-ray showed cardiomegaly. Patient was started on IV ceftriaxone, given IV Reglan, 1 L bolus of normal saline and accepted to tried hospitalist for admission  Denies any vaginal burning No chest pain, no shortness of breath Still having some neck/posterior head pain that is reproducible with palpation  Objective: Vitals:   12/30/18 0708 12/30/18 1327  BP: (!) 142/87 (!) 141/94  Pulse:  (!) 113  Resp:    Temp:  98.4 F (36.9 C)  SpO2:  94%    Intake/Output Summary (Last 24 hours) at 12/30/2018 1549 Last data filed at 12/30/2018 0900 Gross per  24 hour  Intake 1105.88 ml  Output 400 ml  Net 705.88 ml   Filed Weights   12/28/18 2013 12/29/18 0135  Weight: 89.5  kg 87.9 kg    Exam:   General: Obese female, lying in bed, no distress  Cardiovascular: Tachycardic, normal rhythm,no appreciable murmurs rubs or gallops, no peripheral edema  Respiratory: Normal respiratory effort on room air  Abdomen: Soft, nondistended, nontender   Musculoskeletal: Tenderness with palpation of trapezius muscle at insertion site on posterior head/neck  Skin no rashes or lesions  Neurologic alert, no appreciable focal deficits no nuchal rigidity, no meningeal signs  Data Reviewed: Basic Metabolic Panel: Recent Labs  Lab 12/28/18 2057 12/29/18 0523 12/29/18 1202 12/30/18 0444  NA 130* 133* 136 135  K 4.3 3.5 3.4* 3.7  CL 93* 97* 100 102  CO2 24 25 26 25   GLUCOSE 527* 584* 283* 399*  BUN 30* 29* 26* 26*  CREATININE 1.44* 1.23* 1.06* 1.10*  CALCIUM 8.9 8.8* 8.7* 8.7*   Liver Function Tests: Recent Labs  Lab 12/28/18 2057  AST 18  ALT 21  ALKPHOS 96  BILITOT 1.5*  PROT 7.4  ALBUMIN 3.6   Recent Labs  Lab 12/28/18 2057  LIPASE 27   No results for input(s): AMMONIA in the last 168 hours. CBC: Recent Labs  Lab 12/28/18 2057 12/29/18 0523 12/30/18 0444  WBC 9.9 8.6 8.3  NEUTROABS 7.7 6.4  --   HGB 13.7 13.3 12.4  HCT 42.3 40.6 38.8  MCV 87.6 89.0 89.6  PLT 298 262 292   Cardiac Enzymes: No results for input(s): CKTOTAL, CKMB, CKMBINDEX, TROPONINI in the last 168 hours. BNP (last 3 results) No results for input(s): BNP in the last 8760 hours.  ProBNP (last 3 results) No results for input(s): PROBNP in the last 8760 hours.  CBG: Recent Labs  Lab 12/29/18 1213 12/29/18 1647 12/29/18 2020 12/30/18 0759 12/30/18 1207  GLUCAP 283* 222* 186* 339* 217*    Recent Results (from the past 240 hour(s))  Urine culture     Status: None   Collection Time: 12/28/18  8:57 PM  Result Value Ref Range Status   Specimen Description   Final    URINE, RANDOM Performed at [facility], [address] [place], [place] [address]     Special Requests   Final    NONE Performed at [facility], [address] [place], [place] [address]    Culture   Final    NO GROWTH Performed at [facility], [place] [address] [place], [place] [address]    Report Status 12/30/2018 FINAL  Final  Blood culture (routine x 2)     Status: None (Preliminary result)   Collection Time: 12/28/18 10:24 PM  Result Value Ref Range Status   Specimen Description   Final    BLOOD LEFT ANTECUBITAL Performed at [facility], [place] [address] [place], [place] [address]    Special Requests   Final    BOTTLES DRAWN AEROBIC AND ANAEROBIC Blood Culture adequate volume Performed at [facility], [address] [place], [place] [address]    Culture   Final    NO GROWTH 1 DAY Performed at [facility], [place] [address], [place],  [address]    Report Status PENDING  Incomplete  SARS Coronavirus 2 (Hosp order,Performed in [facility] lab via [employer] ID)     Status: None   Collection Time:  12/28/18 10:47 PM  Result Value Ref Range Status   SARS Coronavirus 2 ([employer] ID Now) NEGATIVE NEGATIVE Final    Comment: (NOTE) Interpretive Result Comment(s): COVID 19 Positive SARS CoV 2 target nucleic acids are DETECTED. The SARS CoV 2 RNA is generally detectable in upper and lower respiratory specimens during the acute phase of infection.  Positive results are indicative of active infection with SARS CoV 2.  Clinical correlation with patient history and other diagnostic information is necessary to determine patient infection status.  Positive results do not rule out bacterial infection or coinfection with other viruses. The expected result is Negative. COVID 19 Negative SARS CoV 2 target nucleic acids are NOT DETECTED. The SARS CoV 2 RNA is generally detectable in upper and lower respiratory specimens during the acute phase of infection.  Negative results do not preclude SARS CoV 2 infection, do not rule  out coinfections with other pathogens, and should not be used as the sole basis for treatment or other patient management decisions.  Negative results must be combined with clinical  observations, patient history, and epidemiological information. The expected result is Negative. Invalid Presence or absence of SARS CoV 2 nucleic acids cannot be determined. Repeat testing was performed on the submitted specimen and repeated Invalid results were obtained.  If clinically indicated, additional testing on a new specimen with an alternate test methodology [id number]) is advised.  The SARS CoV 2 RNA is generally detectable in upper and lower respiratory specimens during the acute phase of infection. The expected result is Negative. Fact Sheet for Patients:  GolfingFamily.no Fact Sheet for Healthcare Providers: https://www.hernandez-brewer.com/ This test is not yet approved or cleared by the [place] FDA and has been authorized for detection and/or diagnosis of SARS CoV 2 by FDA under an Emergency Use Authorization (EUA).  This EUA will remain in effect (meaning this test can be used) for the duration of the COVID19 d eclaration under Section 564(b)(1) of the Act, 21 U.S.C. section [phone] 3(b)(1), unless the authorization is terminated or revoked sooner. Performed at [facility], [address], [place], [place] [address]      Studies: US Renal  Result Date: 12/30/2018 CLINICAL DATA:  Mild acute renal insufficiency with cystitis. EXAM: RENAL / URINARY TRACT ULTRASOUND COMPLETE COMPARISON:  CT abdomen pelvis Jan 21, 2016 FINDINGS: Right Kidney: Renal measurements: 11 x 4.1 x 5.9 cm = volume: 138.6 mL . Echogenicity within normal limits. No mass or hydronephrosis visualized. Left Kidney: Renal measurements: 12.8 x 7.4 x 5 cm = volume: 246.6 mL. Echogenicity within normal limits. No mass or hydronephrosis visualized. Bladder: Appears normal for  degree of bladder distention. IMPRESSION: Normal renal ultrasound. Electronically Signed   By: Abelardo Diesel M.D.   On: 12/30/2018 13:19   Dg Chest Portable 1 View  Result Date: 12/28/2018 CLINICAL DATA:  Cough, fever EXAM: PORTABLE CHEST 1 VIEW COMPARISON:  05/22/2018 FINDINGS: Cardiomegaly.  Lungs clear.  No effusions or acute bony abnormality. IMPRESSION: Mild cardiomegaly.  No active disease. Electronically Signed   By: Rolm Baptise M.D.   On: 12/28/2018 22:09    Scheduled Meds: . aspirin EC  81 mg Oral Daily  . atorvastatin  40 mg Oral q1800  . enoxaparin (LOVENOX) injection  40 mg Subcutaneous Daily  . fluconazole  200 mg Oral Daily  . gabapentin  400 mg Oral TID  . hydrochlorothiazide  25 mg Oral Daily  . insulin aspart  0-15 Units Subcutaneous TID WC  . insulin  aspart  0-5 Units Subcutaneous QHS  . insulin aspart  10 Units Subcutaneous TID WC  . insulin glargine  40 Units Subcutaneous BID  . sodium chloride flush  3 mL Intravenous Q12H   Continuous Infusions: . cefTRIAXone (ROCEPHIN)  IV 1 g (12/29/18 2120)    Principal Problem:   Sepsis secondary to UTI [facility]) Active Problems:   Essential hypertension   Uncontrolled type 2 diabetes mellitus with hyperglycemia (HCC)   Mild renal insufficiency   UTI (urinary tract infection)   Yeast cystitis      Desiree Hane  [facility]

## 2018-12-30 NOTE — Progress Notes (Signed)
Pt. BP 152/85 with HR of 119. Pt. Was given PO tylenol at 0452 d/t anterior headache. Pt. States "I only get this type of headache when I have high blood pressure. I need blood pressure medication." Pt. Educated and made aware of the reason as to why lisinopril and hctz are being held at this moment. On call NP [place] paged and made aware. Will continue to monitor.

## 2018-12-30 NOTE — Progress Notes (Signed)
Inpatient Diabetes Program Recommendations  AACE/ADA: New Consensus Statement on Inpatient Glycemic Control (2015)  Target Ranges:  Prepandial:   less than 140 mg/dL      Peak postprandial:   less than 180 mg/dL (1-2 hours)      Critically ill patients:  140 - 180 mg/dL   Lab Results  Component Value Date   GLUCAP 217 (H) 12/30/2018   HGBA1C 12.7 (A) 12/23/2018    Review of Glycemic Control  Diabetes history: DM2 Outpatient Diabetes medications: Lantus 44 units bid, Humalog 10 units tidwc Current orders for Inpatient glycemic control: Lantus 40 units bid, Novolog 0-15 units tidwc and hs + 10 units tidwc  HgbA1C - 12.7% [place] is managing pt's diabetes. Last appt 11/27/18.  Inpatient Diabetes Program Recommendations:    Increase Lantus to 45 units bid Increase Novolog to 12 units tidwc  To speak with pt today regarding her HgbA1C of 12.7%. Doubtful pt is taking insulin as prescribed at home.  Thank you. Lorenda Peck, RD, LDN, CDE Inpatient Diabetes Coordinator [phone]

## 2018-12-31 ENCOUNTER — Inpatient Hospital Stay (HOSPITAL_COMMUNITY): Payer: Self-pay

## 2018-12-31 DIAGNOSIS — M542 Cervicalgia: Secondary | ICD-10-CM

## 2018-12-31 DIAGNOSIS — R291 Meningismus: Secondary | ICD-10-CM

## 2018-12-31 DIAGNOSIS — I10 Essential (primary) hypertension: Secondary | ICD-10-CM

## 2018-12-31 LAB — BASIC METABOLIC PANEL
Anion gap: 9 (ref 5–15)
Anion gap: 8 (ref 5–15)
BUN: 15 mg/dL (ref 6–20)
BUN: 15 mg/dL (ref 6–20)
CO2: 28 mmol/L (ref 22–32)
CO2: 32 mmol/L (ref 22–32)
Calcium: 8.8 mg/dL — ABNORMAL LOW (ref 8.9–10.3)
Calcium: 9.2 mg/dL (ref 8.9–10.3)
Chloride: 100 mmol/L (ref 98–111)
Chloride: 100 mmol/L (ref 98–111)
Creatinine, Ser: 0.89 mg/dL (ref 0.44–1.00)
Creatinine, Ser: 1.19 mg/dL — ABNORMAL HIGH (ref 0.44–1.00)
GFR calc Af Amer: >60 mL/min (ref >60)
GFR calc Af Amer: 60 mL/min — ABNORMAL LOW (ref >60)
GFR calc non Af Amer: >60 mL/min (ref >60)
GFR calc non Af Amer: 51 mL/min — ABNORMAL LOW (ref >60)
Glucose, Bld: 355 mg/dL — ABNORMAL HIGH (ref 70–99)
Glucose, Bld: 128 mg/dL — ABNORMAL HIGH (ref 70–99)
Potassium: 3.4 mmol/L — ABNORMAL LOW (ref 3.5–5.1)
Potassium: 3.4 mmol/L — ABNORMAL LOW (ref 3.5–5.1)
Sodium: 137 mmol/L (ref 135–145)
Sodium: 140 mmol/L (ref 135–145)

## 2018-12-31 LAB — GLUCOSE, CAPILLARY
Glucose-Capillary: 174 mg/dL — ABNORMAL HIGH (ref 70–99)
Glucose-Capillary: 254 mg/dL — ABNORMAL HIGH (ref 70–99)
Glucose-Capillary: 236 mg/dL — ABNORMAL HIGH (ref 70–99)
Glucose-Capillary: 101 mg/dL — ABNORMAL HIGH (ref 70–99)
Glucose-Capillary: 325 mg/dL — ABNORMAL HIGH (ref 70–99)

## 2018-12-31 LAB — ECHOCARDIOGRAM COMPLETE
Height: 64 in
Weight: 3099.2 oz

## 2018-12-31 MED ORDER — HYDRALAZINE HCL 20 MG/ML IJ SOLN: 10.0000 mg | Freq: Once | INTRAMUSCULAR | Status: DC

## 2018-12-31 MED ORDER — HYDRALAZINE HCL 20 MG/ML IJ SOLN
5.0000 mg | Freq: Once | INTRAMUSCULAR | Status: AC
  Administered 2018-12-31: 23:00:00 5 mg via INTRAVENOUS
  Filled 2018-12-31: qty 1

## 2018-12-31 MED ORDER — INSULIN ASPART 100 UNIT/ML ~~LOC~~ SOLN
12.0000 [IU] | Freq: Three times a day (TID) | SUBCUTANEOUS | Status: DC
  Administered 2018-12-31 – 2019-01-02 (×3): 12 [IU] via SUBCUTANEOUS

## 2018-12-31 MED ORDER — INSULIN GLARGINE 100 UNIT/ML ~~LOC~~ SOLN
45.0000 [IU] | Freq: Two times a day (BID) | SUBCUTANEOUS | Status: DC
  Administered 2018-12-31 – 2019-01-02 (×5): 45 [IU] via SUBCUTANEOUS
  Filled 2018-12-31 (×6): qty 0.45

## 2018-12-31 MED ORDER — PERFLUTREN LIPID MICROSPHERE
1.0000 mL | INTRAVENOUS | Status: AC | PRN
  Administered 2018-12-31: 3 mL via INTRAVENOUS
  Filled 2018-12-31: qty 10

## 2018-12-31 NOTE — Progress Notes (Signed)
  Echocardiogram 2D Echocardiogram with definity has been performed.  [name] M 12/31/2018, 3:46 PM

## 2018-12-31 NOTE — Progress Notes (Signed)
[facility] PROGRESS NOTE  Barbara Wang DXA:[id number] DOB: Mar 16, 1966 DOA: 12/28/2018 PCP: Gildardo Pounds, NP  Assessment/Plan: Headache and neck pain, concern for nuchal rigidity Has history of aseptic viral meningitis x2 with similar pain. CT head neg for any acute processes today,scheduled LP by IR on 5/6. Held prophylactic lovenox. Has no confusion but persistent headache despite supportive care. Remains afebrile  Poorly controlled diabetic, A1c 12 (3/20) Concern for development of DKA given anion gap is closed.  Patient still having really high fasting blood glucose.  Will increase to Lantus 45 units twice daily, continue sliding scale closely monitor CBGs.  Funguria in diabetic with concern for yeast cystitis Positive nitrites, rare bacteria, budding yeast present, urine culture unremarkable. Discontinued ceftriaxone and continue fluconazole therapy for 14 days given yeast present in UA, especially given T2DM.    AKI, improving but still not quite at baseline, likely prerenal in the setting of UTI and volume loss related to hyperglycemia.  Completed timed IV fluids, will continue to closely monitor.  We will continue to hold home lisinopril.  Ultrasound shows no hydronephrosis.  Tachycardia Persistent since admission, EKG, TSH, TTE, blood culturesunremarkable.  May be related to neck pain, minimal improvement with IV fluids and does not look dehydrated on exam.  Do not think this related to sepsis as infectious presentation.  Continue to closely monitor  Hypokalemia, follow mag, replete as needed, monitor on BMP.  Hypertension, still not at goal, SBP in 160s Resumed home HCTZ, but holding home Lisinopril with AKI, use IV hyralazine PRN. May need to add additional agent if persists  Hyperlipidemia, stable.  Continue home atorvastatin.  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 year old female with medical history significant for for poorly controlled type 2 diabetes mellitus, hypertension, and hyperlipidemia who presented on 12/28/2018 with evaluation of dysuria, malaise, chills, and headache and was found to have failed outpatient treatment of UTI with ciprofloxacin, redness and headache, and hyperglycemia with elevated anion gap and poorly controlled diabetes  In the ED patient was found to be febrile one 1.3, tachypneic at a rate of 24-28, normal oxygen saturation, tachycardic to 123, and with normotensive blood pressures.  Blood cultures were obtained in the ED and pending.  COVID testing was obtained and was negative.    Lactic acid within normal limits, CBC unremarkable, BMP remarkable for sodium of 130 (corrected 136, anion gap 19), glucose 527, BUN 30, creatinine 1.44  UA showed turbid appearance, glucose greater than 500, ketones present, positive nitrites, trace leukocytes, rare bacteria  Chest x-ray showed cardiomegaly. Patient was started on IV ceftriaxone, given IV Reglan, 1 L bolus of normal saline and accepted to tried hospitalist for admission    This am still having neck pain Can move laterally but can't bring chin to chest, limited by pain that is not reproducible to palpation  Objective: Vitals:   12/31/18 2051 12/31/18 2214  BP: (!) 159/101 (!) 165/99  Pulse: (!) 117 (!) 113  Resp: 20   Temp: 98.5 F (36.9 C)   SpO2: 93%     Intake/Output  Summary (Last 24 hours) at 12/31/2018 2354 Last data filed at 12/31/2018 0500 Gross per 24 hour  Intake 120 ml  Output 1000 ml  Net -880 ml   Filed Weights   12/28/18  2013 12/29/18 0135  Weight: 89.5 kg 87.9 kg    Exam:   General: Obese female, lying in bed, no distress  Cardiovascular: Tachycardic, normal rhythm,no appreciable murmurs rubs or gallops, no peripheral edema  Respiratory: Normal respiratory effort on room air  Abdomen: Soft, nondistended, nontender   Musculoskeletal: posterior headache, neck pain not reproducible to touch  Skin no rashes or lesions  Neurologic alert, no appreciable focal deficits, +  nuchal rigidity  Data Reviewed: Basic Metabolic Panel: Recent Labs  Lab 12/29/18 0523 12/29/18 1202 12/30/18 0444 12/31/18 0424 12/31/18 1557  NA 133* 136 135 137 140  K 3.5 3.4* 3.7 3.4* 3.4*  CL 97* 100 102 100 100  CO2 25 26 25 28  32  GLUCOSE 584* 283* 399* 355* 128*  BUN 29* 26* 26* 15 15  CREATININE 1.23* 1.06* 1.10* 0.89 1.19*  CALCIUM 8.8* 8.7* 8.7* 8.8* 9.2   Liver Function Tests: Recent Labs  Lab 12/28/18 2057  AST 18  ALT 21  ALKPHOS 96  BILITOT 1.5*  PROT 7.4  ALBUMIN 3.6   Recent Labs  Lab 12/28/18 2057  LIPASE 27   No results for input(s): AMMONIA in the last 168 hours. CBC: Recent Labs  Lab 12/28/18 2057 12/29/18 0523 12/30/18 0444  WBC 9.9 8.6 8.3  NEUTROABS 7.7 6.4  --   HGB 13.7 13.3 12.4  HCT 42.3 40.6 38.8  MCV 87.6 89.0 89.6  PLT 298 262 292   Cardiac Enzymes: No results for input(s): CKTOTAL, CKMB, CKMBINDEX, TROPONINI in the last 168 hours. BNP (last 3 results) No results for input(s): BNP in the last 8760 hours.  ProBNP (last 3 results) No results for input(s): PROBNP in the last 8760 hours.  CBG: Recent Labs  Lab 12/30/18 2112 12/31/18 0822 12/31/18 1134 12/31/18 1722 12/31/18 2201  GLUCAP 266* 254* 236* 101* 325*    Recent Results (from the past 240 hour(s))  Urine culture     Status: None   Collection Time: 12/28/18  8:57 PM  Result Value Ref Range Status   Specimen Description   Final    URINE, RANDOM Performed at [facility], [address] [place], [place] [address]    Special Requests   Final    NONE Performed at [facility], [address] [place], [place] [address]    Culture   Final    NO GROWTH Performed at [facility], [place] [address], [place], [place] [address]    Report Status 12/30/2018 FINAL  Final  Blood culture (routine x 2)     Status: None (Preliminary result)   Collection Time: 12/28/18 10:24 PM  Result Value Ref Range Status   Specimen Description   Final    BLOOD LEFT ANTECUBITAL Performed at [facility], [place] [address] [place], [place] [address]    Special Requests   Final    BOTTLES DRAWN AEROBIC AND ANAEROBIC Blood Culture adequate volume Performed at [facility], [address] [place], [place] [address]    Culture   Final    NO GROWTH 2 DAYS Performed at [facility], [place] [address] [place], [place] [address]    Report Status PENDING  Incomplete  SARS Coronavirus 2 (  Hosp order,Performed in [facility] lab via [employer] ID)     Status: None   Collection Time: 12/28/18 10:47 PM  Result Value Ref Range Status   SARS Coronavirus 2 ([employer] ID Now) NEGATIVE NEGATIVE Final    Comment: (NOTE) Interpretive Result Comment(s): COVID 19 Positive SARS CoV 2 target nucleic acids are DETECTED. The SARS CoV 2 RNA is generally detectable in upper and lower respiratory specimens during the acute phase of infection.  Positive results are indicative of active infection with SARS CoV 2.  Clinical correlation with patient history and other diagnostic information is necessary to determine patient infection status.  Positive results do not rule out bacterial infection or coinfection with other viruses. The expected result is Negative. COVID 19 Negative SARS CoV 2 target nucleic acids are NOT DETECTED. The SARS CoV 2 RNA is generally detectable in upper and lower respiratory specimens during the acute phase of infection.  Negative results do not preclude SARS  CoV 2 infection, do not rule out coinfections with other pathogens, and should not be used as the sole basis for treatment or other patient management decisions.  Negative results must be combined with clinical  observations, patient history, and epidemiological information. The expected result is Negative. Invalid Presence or absence of SARS CoV 2 nucleic acids cannot be determined. Repeat testing was performed on the submitted specimen and repeated Invalid results were obtained.  If clinically indicated, additional testing on a new specimen with an alternate test methodology [phone]) is advised.  The SARS CoV 2 RNA is generally detectable in upper and lower respiratory specimens during the acute phase of infection. The expected result is Negative. Fact Sheet for Patients:  GolfingFamily.no Fact Sheet for Healthcare Providers: https://www.hernandez-brewer.com/ This test is not yet approved or cleared by the [place] FDA and has been authorized for detection and/or diagnosis of SARS CoV 2 by FDA under an Emergency Use Authorization (EUA).  This EUA will remain in effect (meaning this test can be used) for the duration of the COVID19 d eclaration under Section 564(b)(1) of the Act, 21 U.S.C. section [phone] 3(b)(1), unless the authorization is terminated or revoked sooner. Performed at [facility], [address], [place], [place] [address]      Studies: Ct Head Wo Contrast  Result Date: 12/31/2018 CLINICAL DATA:  Headache for 1 week. EXAM: CT HEAD WITHOUT CONTRAST TECHNIQUE: Contiguous axial images were obtained from the base of the skull through the vertex without intravenous contrast. COMPARISON:  May 15, 2017 FINDINGS: Brain: No evidence of acute infarction, hemorrhage, hydrocephalus, extra-axial collection. 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  Vascular: No hyperdense vessel or unexpected calcification. Skull: Normal. Negative for fracture or focal lesion. Sinuses/Orbits: No acute finding. Other: None. 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 No focal acute intracranial abnormality identified. Electronically Signed   By: Abelardo Diesel M.D.   On: 12/31/2018 15:51   US Renal  Result Date: 12/30/2018 CLINICAL DATA:  Mild acute renal insufficiency with cystitis. EXAM: RENAL / URINARY TRACT ULTRASOUND COMPLETE COMPARISON:  CT abdomen pelvis Jan 21, 2016 FINDINGS: Right Kidney: Renal measurements: 11 x 4.1 x 5.9 cm = volume: 138.6 mL . Echogenicity within normal limits. No mass or hydronephrosis visualized. Left Kidney: Renal measurements: 12.8 x 7.4 x 5 cm = volume: 246.6 mL. Echogenicity within normal limits. No mass or hydronephrosis visualized. Bladder: Appears  normal for degree of bladder distention. IMPRESSION: Normal renal ultrasound. Electronically Signed   By: Abelardo Diesel M.D.   On: 12/30/2018 13:19    Scheduled Meds: . aspirin EC  81 mg Oral Daily  . atorvastatin  40 mg Oral q1800  . fluconazole  200 mg Oral Daily  . gabapentin  400 mg Oral TID  . hydrochlorothiazide  25 mg Oral Daily  . insulin aspart  0-15 Units Subcutaneous TID WC  . insulin aspart  0-5 Units Subcutaneous QHS  . insulin aspart  12 Units Subcutaneous TID WC  . insulin glargine  45 Units Subcutaneous BID  . sodium chloride flush  3 mL Intravenous Q12H   Continuous Infusions:   Principal Problem:   Sepsis secondary to UTI [facility]) Active Problems:   Essential hypertension   Uncontrolled type 2 diabetes mellitus with hyperglycemia (HCC)   Mild renal insufficiency   UTI (urinary tract infection)   Yeast cystitis   Tachycardia      Desiree Hane  [facility]

## 2019-01-01 ENCOUNTER — Inpatient Hospital Stay (HOSPITAL_COMMUNITY): Payer: Self-pay

## 2019-01-01 DIAGNOSIS — N3000 Acute cystitis without hematuria: Secondary | ICD-10-CM

## 2019-01-01 DIAGNOSIS — R291 Meningismus: Secondary | ICD-10-CM | POA: Clinically undetermined

## 2019-01-01 LAB — CBC
HCT: 38.5 % (ref 36.0–46.0)
Hemoglobin: 12.7 g/dL (ref 12.0–15.0)
MCH: 29.5 pg (ref 26.0–34.0)
MCHC: 33 g/dL (ref 30.0–36.0)
MCV: 89.3 fL (ref 80.0–100.0)
Platelets: 405 10*3/uL — ABNORMAL HIGH (ref 150–400)
RBC: 4.31 MIL/uL (ref 3.87–5.11)
RDW: 12.5 % (ref 11.5–15.5)
WBC: 8 10*3/uL (ref 4.0–10.5)
nRBC: 0 % (ref 0.0–0.2)

## 2019-01-01 LAB — BASIC METABOLIC PANEL
Anion gap: 11 (ref 5–15)
BUN: 15 mg/dL (ref 6–20)
CO2: 29 mmol/L (ref 22–32)
Calcium: 9 mg/dL (ref 8.9–10.3)
Chloride: 99 mmol/L (ref 98–111)
Creatinine, Ser: 0.84 mg/dL (ref 0.44–1.00)
GFR calc Af Amer: >60 mL/min (ref >60)
GFR calc non Af Amer: >60 mL/min (ref >60)
Glucose, Bld: 259 mg/dL — ABNORMAL HIGH (ref 70–99)
Potassium: 3.5 mmol/L (ref 3.5–5.1)
Sodium: 139 mmol/L (ref 135–145)

## 2019-01-01 LAB — CSF CELL COUNT WITH DIFFERENTIAL
Eosinophils, CSF: 0 % (ref 0–1)
Eosinophils, CSF: 0 % (ref 0–1)
Lymphs, CSF: 63 % (ref 40–80)
Lymphs, CSF: 74 % (ref 40–80)
Monocyte-Macrophage-Spinal Fluid: 3 % — ABNORMAL LOW (ref 15–45)
Monocyte-Macrophage-Spinal Fluid: 4 % — ABNORMAL LOW (ref 15–45)
RBC Count, CSF: 1890 /mm3 — ABNORMAL HIGH
RBC Count, CSF: 490 /mm3 — ABNORMAL HIGH
Segmented Neutrophils-CSF: 23 % — ABNORMAL HIGH (ref 0–6)
Segmented Neutrophils-CSF: 33 % — ABNORMAL HIGH (ref 0–6)
Tube #: 1
Tube #: 4
WBC, CSF: 4 /mm3 (ref 0–5)
WBC, CSF: 6 /mm3 — ABNORMAL HIGH (ref 0–5)

## 2019-01-01 LAB — GLUCOSE, CAPILLARY
Glucose-Capillary: 221 mg/dL — ABNORMAL HIGH (ref 70–99)
Glucose-Capillary: 112 mg/dL — ABNORMAL HIGH (ref 70–99)
Glucose-Capillary: 122 mg/dL — ABNORMAL HIGH (ref 70–99)
Glucose-Capillary: 206 mg/dL — ABNORMAL HIGH (ref 70–99)

## 2019-01-01 LAB — PROTEIN AND GLUCOSE, CSF
Glucose, CSF: 112 mg/dL — ABNORMAL HIGH (ref 40–70)
Total  Protein, CSF: 64 mg/dL — ABNORMAL HIGH (ref 15–45)

## 2019-01-01 MED ORDER — OXYBUTYNIN CHLORIDE 5 MG PO TABS
5.0000 mg | ORAL_TABLET | Freq: Three times a day (TID) | ORAL | Status: DC
  Administered 2019-01-01 – 2019-01-02 (×3): 5 mg via ORAL
  Filled 2019-01-01 (×3): qty 1

## 2019-01-01 MED ORDER — SALINE SPRAY 0.65 % NA SOLN
1.0000 | NASAL | Status: DC | PRN
  Administered 2019-01-01: 1 via NASAL
  Filled 2019-01-01: qty 44

## 2019-01-01 MED ORDER — FLUTICASONE PROPIONATE 50 MCG/ACT NA SUSP
2.0000 | Freq: Two times a day (BID) | NASAL | Status: DC
  Administered 2019-01-01: 17:00:00 2 via NASAL
  Filled 2019-01-01: qty 16

## 2019-01-01 MED ORDER — PHENAZOPYRIDINE HCL 100 MG PO TABS
100.0000 mg | ORAL_TABLET | Freq: Three times a day (TID) | ORAL | Status: DC
  Administered 2019-01-01 – 2019-01-02 (×2): 100 mg via ORAL
  Filled 2019-01-01 (×4): qty 1

## 2019-01-01 MED ORDER — LIDOCAINE HCL 1 % IJ SOLN
INTRAMUSCULAR | Status: AC
  Administered 2019-01-01: 13:00:00
  Filled 2019-01-01: qty 20

## 2019-01-01 NOTE — Plan of Care (Signed)
  Problem: Health Behavior/Discharge Planning: Goal: Ability to manage health-related needs will improve Outcome: Progressing   Problem: Clinical Measurements: Goal: Ability to maintain clinical measurements within normal limits will improve Outcome: Progressing Goal: Diagnostic test results will improve Outcome: Progressing   

## 2019-01-01 NOTE — Progress Notes (Signed)
PROGRESS NOTE    Barbara Wang  OVZ:[id number] DOB: 1966/01/01 DOA: 12/28/2018 PCP: Gildardo Pounds, NP    Brief Narrative:  53 year old female who presented with malaise, dysuria, chills and headache.  She does have significant past medical history for poorly controlled type 2 diabetes mellitus, hypertension and dyslipidemia.  Reported 7 days of dysuria, generalized malaise and headache.  She was diagnosed with a urinary tract infection as an outpatient April 27, received ciprofloxacin.  Her symptoms were persistent despite antibiotic therapy, including fevers.  On her initial physical examination her temperature was 38.5 C, heart rate 120 bpm, blood pressure 110/56, her lungs were clear to auscultation bilaterally, heart S1-S2 present and rhythmic, abdomen soft nontender, no lower extremity edema.  Neurologically patient was nonfocal.  Patient was admitted to the hospital working diagnosis of sepsis due to urinary tract infection, failed outpatient therapy.  Patient has been slow to respond to medical therapy, she has been placed on fluconazole for yeast in the urine.     Assessment & Plan:   Principal Problem:   Sepsis secondary to UTI [facility]) Active Problems:   Essential hypertension   Uncontrolled type 2 diabetes mellitus with hyperglycemia (HCC)   Mild renal insufficiency   UTI (urinary tract infection)   Yeast cystitis   Tachycardia   Nuchal rigidity   1. Urinary tract infection with cystitis. Patient has completed antibiotic therapy but continue to be symptomatic, she has been placed on diflucan for suspected yeast infection. Will resend urine culture, and will change phenazopyridine to tid schedule, and will add oxybutynin for possible bladder spasms.   2. Neck pain and rigidity. Patient scheduled to have LP today for further workup, will add flexeril for muscle relaxant for now. Follow on fluid analysis.   3. Acute sinusitis. Add fluticasone spry and as needed saline  spry.   4. T2DM. Continue glucose cover and monitoring with insulin sliding scale, patient is tolerating po well. Continue insulin therapy with glargine 45 units bid, plus 12 units before meals. Her fasting glucose is 259 mg/dl today.   5. HTN. Continue blood pressure control.   6. Dyslipidemia. Continue statin therapy.   7. Obesity. Calculated BMI 33,2.   DVT prophylaxis: enoxaparin   Code Status:  full Family Communication: no family at the bedside  Disposition Plan/ discharge barriers: pending clinical improvement   Body mass index is 33.25 kg/m. Malnutrition Type:      Malnutrition Characteristics:      Nutrition Interventions:     RN Pressure Injury Documentation:     Consultants:     Procedures:     Antimicrobials:       Subjective: Patient continue to have bladders spasms and headache, no nausea or vomiting, no chest pain or dyspnea.   Objective: Vitals:   12/31/18 1405 12/31/18 2051 12/31/18 2214 01/01/19 0411  BP: (!) 148/99 (!) 159/101 (!) 165/99 139/69  Pulse: (!) 116 (!) 117 (!) 113 88  Resp: 16 20  18   Temp: 98.2 F (36.8 C) 98.5 F (36.9 C)  99.5 F (37.5 C)  TempSrc:      SpO2: 98% 93%  94%  Weight:      Height:        Intake/Output Summary (Last 24 hours) at 01/01/2019 1050 Last data filed at 01/01/2019 1042 Gross per 24 hour  Intake 0 ml  Output -  Net 0 ml   Filed Weights   12/28/18 2013 12/29/18 0135  Weight: 89.5 kg 87.9 kg  Examination:   General: deconditioned  Neurology: Awake and alert, non focal  E ENT: mild pallor, no icterus, oral mucosa moist Cardiovascular: No JVD. S1-S2 present, rhythmic, no gallops, rubs, or murmurs. No lower extremity edema. Pulmonary: positive breath sounds bilaterally, adequate air movement, no wheezing, rhonchi or rales. Gastrointestinal. Abdomen protuberant but wiht no organomegaly, non tender, no rebound or guarding Skin. No rashes Musculoskeletal: no joint deformities      Data Reviewed: I have personally reviewed following labs and imaging studies  CBC: Recent Labs  Lab 12/28/18 2057 12/29/18 0523 12/30/18 0444 01/01/19 0440  WBC 9.9 8.6 8.3 8.0  NEUTROABS 7.7 6.4  --   --   HGB 13.7 13.3 12.4 12.7  HCT 42.3 40.6 38.8 38.5  MCV 87.6 89.0 89.6 89.3  PLT 298 262 292 607*   Basic Metabolic Panel: Recent Labs  Lab 12/29/18 1202 12/30/18 0444 12/31/18 0424 12/31/18 1557 01/01/19 0440  NA 136 135 137 140 139  K 3.4* 3.7 3.4* 3.4* 3.5  CL 100 102 100 100 99  CO2 26 25 28  32 29  GLUCOSE 283* 399* 355* 128* 259*  BUN 26* 26* 15 15 15   CREATININE 1.06* 1.10* 0.89 1.19* 0.84  CALCIUM 8.7* 8.7* 8.8* 9.2 9.0   GFR: Estimated Creatinine Clearance: 81.2 mL/min (by C-G formula based on SCr of 0.84 mg/dL). Liver Function Tests: Recent Labs  Lab 12/28/18 2057  AST 18  ALT 21  ALKPHOS 96  BILITOT 1.5*  PROT 7.4  ALBUMIN 3.6   Recent Labs  Lab 12/28/18 2057  LIPASE 27   No results for input(s): AMMONIA in the last 168 hours. Coagulation Profile: No results for input(s): INR, PROTIME in the last 168 hours. Cardiac Enzymes: No results for input(s): CKTOTAL, CKMB, CKMBINDEX, TROPONINI in the last 168 hours. BNP (last 3 results) No results for input(s): PROBNP in the last 8760 hours. HbA1C: No results for input(s): HGBA1C in the last 72 hours. CBG: Recent Labs  Lab 12/31/18 0822 12/31/18 1134 12/31/18 1722 12/31/18 2201 01/01/19 0756  GLUCAP 254* 236* 101* 325* 221*   Lipid Profile: No results for input(s): CHOL, HDL, LDLCALC, TRIG, CHOLHDL, LDLDIRECT in the last 72 hours. Thyroid Function Tests: Recent Labs    12/30/18 1646  TSH 0.533   Anemia Panel: No results for input(s): VITAMINB12, FOLATE, FERRITIN, TIBC, IRON, RETICCTPCT in the last 72 hours.    Radiology Studies: I have reviewed all of the imaging during this hospital visit personally     Scheduled Meds: . aspirin EC  81 mg Oral Daily  . atorvastatin  40 mg  Oral q1800  . fluconazole  200 mg Oral Daily  . gabapentin  400 mg Oral TID  . hydrochlorothiazide  25 mg Oral Daily  . insulin aspart  0-15 Units Subcutaneous TID WC  . insulin aspart  0-5 Units Subcutaneous QHS  . insulin aspart  12 Units Subcutaneous TID WC  . insulin glargine  45 Units Subcutaneous BID  . sodium chloride flush  3 mL Intravenous Q12H   Continuous Infusions:   LOS: 3 days        Barbara Wang Gerome Apley, MD

## 2019-01-02 LAB — BASIC METABOLIC PANEL
Anion gap: 11 (ref 5–15)
BUN: 23 mg/dL — ABNORMAL HIGH (ref 6–20)
CO2: 27 mmol/L (ref 22–32)
Calcium: 9 mg/dL (ref 8.9–10.3)
Chloride: 98 mmol/L (ref 98–111)
Creatinine, Ser: 1.13 mg/dL — ABNORMAL HIGH (ref 0.44–1.00)
GFR calc Af Amer: >60 mL/min (ref >60)
GFR calc non Af Amer: 55 mL/min — ABNORMAL LOW (ref >60)
Glucose, Bld: 204 mg/dL — ABNORMAL HIGH (ref 70–99)
Potassium: 3.5 mmol/L (ref 3.5–5.1)
Sodium: 136 mmol/L (ref 135–145)

## 2019-01-02 LAB — CBC WITH DIFFERENTIAL/PLATELET
Abs Immature Granulocytes: 0.06 10*3/uL (ref 0.00–0.07)
Basophils Absolute: 0 10*3/uL (ref 0.0–0.1)
Basophils Relative: 0 %
Eosinophils Absolute: 0.1 10*3/uL (ref 0.0–0.5)
Eosinophils Relative: 1 %
HCT: 37.4 % (ref 36.0–46.0)
Hemoglobin: 12.3 g/dL (ref 12.0–15.0)
Immature Granulocytes: 1 %
Lymphocytes Relative: 9 %
Lymphs Abs: 0.9 10*3/uL (ref 0.7–4.0)
MCH: 29.4 pg (ref 26.0–34.0)
MCHC: 32.9 g/dL (ref 30.0–36.0)
MCV: 89.3 fL (ref 80.0–100.0)
Monocytes Absolute: 0.7 10*3/uL (ref 0.1–1.0)
Monocytes Relative: 7 %
Neutro Abs: 8.8 10*3/uL — ABNORMAL HIGH (ref 1.7–7.7)
Neutrophils Relative %: 82 %
Platelets: 353 10*3/uL (ref 150–400)
RBC: 4.19 MIL/uL (ref 3.87–5.11)
RDW: 12.6 % (ref 11.5–15.5)
WBC: 10.6 10*3/uL — ABNORMAL HIGH (ref 4.0–10.5)
nRBC: 0 % (ref 0.0–0.2)

## 2019-01-02 LAB — URINE CULTURE: Culture: NO GROWTH

## 2019-01-02 LAB — GLUCOSE, CAPILLARY
Glucose-Capillary: 146 mg/dL — ABNORMAL HIGH (ref 70–99)
Glucose-Capillary: 140 mg/dL — ABNORMAL HIGH (ref 70–99)

## 2019-01-02 MED ORDER — FLUTICASONE PROPIONATE 50 MCG/ACT NA SUSP: 2.0000 | Freq: Two times a day (BID) | NASAL | 0 refills | Status: DC

## 2019-01-02 MED ORDER — OXYBUTYNIN CHLORIDE 5 MG PO TABS: 5.0000 mg | ORAL_TABLET | Freq: Two times a day (BID) | ORAL | 0 refills | Status: AC

## 2019-01-02 MED ORDER — SALINE SPRAY 0.65 % NA SOLN: 1.0000 | NASAL | 0 refills | Status: DC | PRN

## 2019-01-02 MED ORDER — OXYBUTYNIN CHLORIDE 5 MG PO TABS: 5.0000 mg | ORAL_TABLET | Freq: Two times a day (BID) | ORAL | Status: DC

## 2019-01-02 MED FILL — FLUTICASONE PROP 50 MCG SPR: 50 | 30 days supply | Qty: 16 | Fill #0

## 2019-01-02 MED FILL — OXYBUTYNIN 5 MG TABLET: 5 | 5 days supply | Qty: 10 | Fill #0

## 2019-01-02 NOTE — Discharge Summary (Signed)
Physician Discharge Summary  Barbara Wang JXB:[id number] DOB: 04-09-1966 DOA: 12/28/2018  PCP: Gildardo Pounds, NP  Admit date: 12/28/2018 Discharge date: 01/02/2019  Admitted From: Home  Disposition:  Home   Recommendations for Outpatient Follow-up and new medication changes:  1. Follow up with Gildardo Pounds NP in 7 days.  2. Patient has been placed on oxybutynin trial for bladder spasms. 3. Completed antibiotic in the hospital. 4. Continue nasal fluticasone for sinus congestion.   Home Health: no  Equipment/Devices:no    Discharge Condition: stable  CODE STATUS: full  Diet recommendation: heart healthy and diabetic prudent.   Brief/Interim Summary: 53 year old female who presented with malaise, dysuria, chills and headache.  She does have significant past medical history for poorly controlled type 2 diabetes mellitus, hypertension and dyslipidemia.  Reported 7 days of dysuria, generalized malaise and headache.  She was diagnosed with a urinary tract infection as an outpatient April 27, received ciprofloxacin.  Her symptoms were persistent despite antibiotic therapy, including fevers.  On her initial physical examination her temperature was 38.5 C, heart rate 120 bpm, blood pressure 110/56, her lungs were clear to auscultation bilaterally, heart S1-S2 present and rhythmic, abdomen soft nontender, no lower extremity edema.  Neurologically patient was nonfocal.  Sodium 133, potassium 3.5, chloride 97, bicarb 25, glucose 584, BUN 39, creatinine 1.23, white count 8.6, hemoglobin 13.3, hematocrit 40.6, platelets 262, her urinalysis had more than 50 white cells.  Her chest radiograph was negative for infiltrates.  EKG 127 bpm, normal axis, normal intervals, low voltage, no significant ST segment or T wave changes.  Patient was admitted to the hospital with the working diagnosis of sepsis due to urinary tract infection, failed outpatient therapy.  1.  Urinary tract infection with  cystitis and bladder spasms.  Patient was admitted to the hospital, she was placed on IV fluids and IV antibiotic therapy with good toleration.  Her cultures remain no growth, she received oxybutynin and phenazopyridine with significant improvement of her symptoms.  Discharge white cell count is 10.6./Her discharge temperature was 37.8 and 36.9.   2.  Musculoskeletal neck pain.  Patient received cyclobenzaprine with improvement of her symptoms.  Further work-up with LP revealed no signs of meningitis.  3.  Acute sinusitis.  Patient was placed on fluticasone and nasal sprays with improvement of her symptoms.  4.  Type 2 diabetes mellitus.  She was placed on basal insulin, pre-meal insulin and insulin sliding scale.  Her glucose remained high, during her hospitalization, at discharge fasting glucose 204.   5.  Hypertension.  She will continue hydrochlorothiazide and lisinopril for blood pressure control.  6.  Dyslipidemia.  Continue statin therapy.  7.  Obesity.  Calculated BMI 33.2   Discharge Diagnoses:  Principal Problem:   Sepsis secondary to UTI [facility]) Active Problems:   Essential hypertension   Uncontrolled type 2 diabetes mellitus with hyperglycemia (HCC)   Mild renal insufficiency   UTI (urinary tract infection)   Yeast cystitis   Tachycardia   Nuchal rigidity    Discharge Instructions   Allergies as of 01/02/2019      Reactions   Penicillins Hives, Shortness Of Breath   Has patient had a PCN reaction causing immediate rash, facial/tongue/throat swelling, SOB or lightheadedness with hypotension: Yes Has patient had a PCN reaction causing severe rash involving mucus membranes or skin necrosis: No Has patient had a PCN reaction that required hospitalization No Has patient had a PCN reaction occurring within the last 10 years: No  If all of the above answers are "NO", then may proceed with Cephalosporin use.   Azithromycin Hives   Erythromycin Base Other (See Comments)    Unknown   Sulfa Antibiotics Hives   Tramadol Hives      Medication List    STOP taking these medications   ciprofloxacin 500 MG tablet Commonly known as:  Cipro     TAKE these medications   acetaminophen 500 MG tablet Commonly known as:  TYLENOL Take 1,000 mg by mouth every 6 (six) hours as needed for moderate pain.   albuterol 108 (90 Base) MCG/ACT inhaler Commonly known as:  VENTOLIN HFA Inhale 1-2 puffs into the lungs every 6 (six) hours as needed for wheezing or shortness of breath.   aspirin 81 MG EC tablet Take 1 tablet (81 mg total) by mouth daily.   atorvastatin 40 MG tablet Commonly known as:  LIPITOR Take 1 tablet (40 mg total) by mouth daily at 6 PM.   cetirizine 10 MG tablet Commonly known as:  ZYRTEC Take 1 tablet (10 mg total) by mouth daily. What changed:    when to take this  reasons to take this   fluticasone 50 MCG/ACT nasal spray Commonly known as:  FLONASE Place 2 sprays into both nostrils 2 (two) times a day. What changed:    when to take this  reasons to take this   gabapentin 600 MG tablet Commonly known as:  Neurontin Take 1 tablet (600 mg total) by mouth 3 (three) times daily.   glucose blood test strip Use as instructed   ibuprofen 200 MG tablet Commonly known as:  ADVIL Take 800 mg by mouth every 6 (six) hours as needed for moderate pain. What changed:  Another medication with the same name was removed. Continue taking this medication, and follow the directions you see here.   Insulin Glargine 100 UNIT/ML Solostar Pen Commonly known as:  LANTUS Inject 40 Units into the skin 2 (two) times daily. What changed:  how much to take   insulin lispro 100 UNIT/ML KiwkPen Commonly known as:  HumaLOG KwikPen Inject 10 units total into the skin three times daily before meals. DO not give insulin if blood sugar is <120. What changed:    how much to take  how to take this  when to take this  additional instructions    lisinopril-hydrochlorothiazide 20-25 MG tablet Commonly known as:  ZESTORETIC Take 1 tablet by mouth daily.   loratadine 10 MG tablet Commonly known as:  CLARITIN Take by mouth daily as needed for allergies.   oxybutynin 5 MG tablet Commonly known as:  DITROPAN Take 1 tablet (5 mg total) by mouth 2 (two) times daily for 5 days.   phenazopyridine 100 MG tablet Commonly known as:  Pyridium Take 1 tablet (100 mg total) by mouth 3 (three) times daily as needed for pain.   sodium chloride 0.65 % Soln nasal spray Commonly known as:  OCEAN Place 1 spray into both nostrils every 4 (four) hours as needed for congestion.   traZODone 100 MG tablet Commonly known as:  DESYREL Take 1 tablet (100 mg total) by mouth at bedtime.   True Metrix Go Glucose Meter w/Device Kit 1 each by Does not apply route every 8 (eight) hours as needed.   TRUEplus Lancets 26G Misc 1 each by Does not apply route every 8 (eight) hours as needed.       Allergies  Allergen Reactions  . Penicillins Hives and Shortness Of Breath  Has patient had a PCN reaction causing immediate rash, facial/tongue/throat swelling, SOB or lightheadedness with hypotension: Yes Has patient had a PCN reaction causing severe rash involving mucus membranes or skin necrosis: No Has patient had a PCN reaction that required hospitalization No Has patient had a PCN reaction occurring within the last 10 years: No If all of the above answers are "NO", then may proceed with Cephalosporin use.  . Azithromycin Hives  . Erythromycin Base Other (See Comments)    Unknown  . Sulfa Antibiotics Hives  . Tramadol Hives    Consultations:     Procedures/Studies: Ct Head Wo Contrast  Result Date: 12/31/2018 CLINICAL DATA:  Headache for 1 week. EXAM: CT HEAD WITHOUT CONTRAST TECHNIQUE: Contiguous axial images were obtained from the base of the skull through the vertex without intravenous contrast. COMPARISON:  May 15, 2017 FINDINGS:  Brain: No evidence of acute infarction, hemorrhage, hydrocephalus, extra-axial collection. 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 Vascular: No hyperdense vessel or unexpected calcification. Skull: Normal. Negative for fracture or focal lesion. Sinuses/Orbits: No acute finding. Other: None. 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 No focal acute intracranial abnormality identified. Electronically Signed   By: Abelardo Diesel M.D.   On: 12/31/2018 15:51   US Renal  Result Date: 12/30/2018 CLINICAL DATA:  Mild acute renal insufficiency with cystitis. EXAM: RENAL / URINARY TRACT ULTRASOUND COMPLETE COMPARISON:  CT abdomen pelvis Jan 21, 2016 FINDINGS: Right Kidney: Renal measurements: 11 x 4.1 x 5.9 cm = volume: 138.6 mL . Echogenicity within normal limits. No mass or hydronephrosis visualized. Left Kidney: Renal measurements: 12.8 x 7.4 x 5 cm = volume: 246.6 mL. Echogenicity within normal limits. No mass or hydronephrosis visualized. Bladder: Appears normal for degree of bladder distention. IMPRESSION: Normal renal ultrasound. Electronically Signed   By: Abelardo Diesel M.D.   On: 12/30/2018 13:19   Dg Chest Portable 1 View  Result Date: 12/28/2018 CLINICAL DATA:  Cough, fever EXAM: PORTABLE CHEST 1 VIEW COMPARISON:  05/22/2018 FINDINGS: Cardiomegaly.  Lungs clear.  No effusions or acute bony abnormality. IMPRESSION: Mild cardiomegaly.  No active disease. Electronically Signed   By: Rolm Baptise M.D.   On: 12/28/2018 22:09   Dg Fluoro Guided Needle Plc Aspiration/injection Loc  Result Date: 01/01/2019 CLINICAL DATA:  History of meningitis. Evaluate cerebral spinal fluid EXAM: DIAGNOSTIC LUMBAR PUNCTURE UNDER FLUOROSCOPIC GUIDANCE FLUOROSCOPY TIME:  Radiation Exposure Index (as provided by the fluoroscopic device): 70.8 mGy If the device does not  provide the exposure index: Fluoroscopy Time (in minutes and seconds):  2 minutes 36 seconds Number of Acquired Images:  1 PROCEDURE: Informed consent was obtained from the patient prior to the procedure, including potential complications of headache, allergy, and pain. With the patient prone, the lower back was prepped with Betadine. 1% Lidocaine was used for local anesthesia. Lumbar puncture was performed at the initially at the L3-L4 level using a 20 gauge 3.5 inch needle. Second attempt at L4-L5 with a 22 gauge 5.0 inch needle. With return of clear CSF with an opening pressure of 15 cm water. Seven ml of CSF were obtained for laboratory studies. The patient tolerated the procedure well and there were no apparent complications. IMPRESSION: Successful  lumbar puncture. Electronically Signed   By: Suzy Bouchard M.D.   On: 01/01/2019 14:30      Procedures:   Subjective: Patient is feeling better, suprapubic  pain has improved along with sinus congestion, no nausea or vomiting, no fever or chills.   Discharge Exam: Vitals:   01/02/19 0615 01/02/19 0830  BP: 124/66   Pulse: (!) 118   Resp: 18   Temp: 100 F (37.8 C) 98.5 F (36.9 C)  SpO2: 100%    Vitals:   01/01/19 2114 01/02/19 0530 01/02/19 0615 01/02/19 0830  BP: 130/77  124/66   Pulse: (!) 103  (!) 118   Resp: 18  18   Temp: 98.2 F (36.8 C) (!) 100.6 F (38.1 C) 100 F (37.8 C) 98.5 F (36.9 C)  TempSrc:  Oral Oral Oral  SpO2: 94%  100%   Weight:      Height:        General: Not in pain or dyspnea.  Neurology: Awake and alert, non focal  E ENT: no pallor, no icterus, oral mucosa moist Cardiovascular: No JVD. S1-S2 present, rhythmic, no gallops, rubs, or murmurs. No lower extremity edema. Pulmonary: positive breath sounds bilaterally, adequate air movement, no wheezing, rhonchi or rales. Gastrointestinal. Abdomen with no organomegaly, non tender, no rebound or guarding Skin. No rashes Musculoskeletal: no joint  deformities   The results of significant diagnostics from this hospitalization (including imaging, microbiology, ancillary and laboratory) are listed below for reference.     Microbiology: Recent Results (from the past 240 hour(s))  Urine culture     Status: None   Collection Time: 12/28/18  8:57 PM  Result Value Ref Range Status   Specimen Description   Final    URINE, RANDOM Performed at [facility], [address] [place], [place] [address]    Special Requests   Final    NONE Performed at [facility], [address] [place], [place] [address]    Culture   Final    NO GROWTH Performed at [facility], [place] [address], [place], [place] [address]    Report Status 12/30/2018 FINAL  Final  Blood culture (routine x 2)     Status: None (Preliminary result)   Collection Time: 12/28/18 10:24 PM  Result Value Ref Range Status   Specimen Description   Final    BLOOD LEFT ANTECUBITAL Performed at [facility], [place] [address] [place], [place] [address]    Special Requests   Final    BOTTLES DRAWN AEROBIC AND ANAEROBIC Blood Culture adequate volume Performed at [facility], [address] [place], [place] [address]    Culture   Final    NO GROWTH 4 DAYS Performed at [facility], [place] [address] [place], [place] [address]    Report Status PENDING  Incomplete  SARS Coronavirus 2 (Hosp order,Performed in [facility] lab via Abbott ID)     Status: None   Collection Time: 12/28/18 10:47 PM  Result Value Ref Range Status   SARS Coronavirus 2 (Abbott ID Now) NEGATIVE NEGATIVE Final    Comment: (NOTE) Interpretive Result Comment(s): COVID 19 Positive SARS CoV 2 target nucleic acids are DETECTED. The SARS CoV 2 RNA is generally detectable in upper and lower respiratory specimens during the acute phase of infection.  Positive results are indicative of active infection with SARS CoV 2.  Clinical correlation with patient history  and other diagnostic information is necessary to determine patient infection status.  Positive results do not rule out bacterial infection or coinfection with other viruses. The expected result is Negative. COVID 19 Negative  SARS CoV 2 target nucleic acids are NOT DETECTED. The SARS CoV 2 RNA is generally detectable in upper and lower respiratory specimens during the acute phase of infection.  Negative results do not preclude SARS CoV 2 infection, do not rule out coinfections with other pathogens, and should not be used as the sole basis for treatment or other patient management decisions.  Negative results must be combined with clinical  observations, patient history, and epidemiological information. The expected result is Negative. Invalid Presence or absence of SARS CoV 2 nucleic acids cannot be determined. Repeat testing was performed on the submitted specimen and repeated Invalid results were obtained.  If clinically indicated, additional testing on a new specimen with an alternate test methodology [phone]) is advised.  The SARS CoV 2 RNA is generally detectable in upper and lower respiratory specimens during the acute phase of infection. The expected result is Negative. Fact Sheet for Patients:  GolfingFamily.no Fact Sheet for Healthcare Providers: https://www.hernandez-brewer.com/ This test is not yet approved or cleared by the [place] FDA and has been authorized for detection and/or diagnosis of SARS CoV 2 by FDA under an Emergency Use Authorization (EUA).  This EUA will remain in effect (meaning this test can be used) for the duration of the COVID19 d eclaration under Section 564(b)(1) of the Act, 21 U.S.C. section [phone] 3(b)(1), unless the authorization is terminated or revoked sooner. Performed at [facility], [address] [place], [place] [address]   CSF culture     Status: None (Preliminary result)    Collection Time: 01/01/19  1:06 PM  Result Value Ref Range Status   Specimen Description   Final    CSF Performed at [place] [address], [place], [place] [address]    Special Requests   Final    NONE Performed at [facility], [place] [address], [place], [place] [address]    Gram Stain   Final    WBC PRESENT,BOTH PMN AND MONONUCLEAR NO ORGANISMS SEEN CYTOSPIN SMEAR Gram Stain Report Called to,Read Back By and Verified With: K.CARTER AT 0962 ON 01/01/19 BY N.THOMPSON Performed at [facility], [place] [address], [place], [place] [address]    Culture   Final    NO GROWTH < 12 HOURS Performed at [facility] [place] [address] [place], [place] [address]    Report Status PENDING  Incomplete     Labs: BNP (last 3 results) No results for input(s): BNP in the last 8760 hours. Basic Metabolic Panel: Recent Labs  Lab 12/30/18 0444 12/31/18 0424 12/31/18 1557 01/01/19 0440 01/02/19 0444  NA 135 137 140 139 136  K 3.7 3.4* 3.4* 3.5 3.5  CL 102 100 100 99 98  CO2 25 28 32 29 27  GLUCOSE 399* 355* 128* 259* 204*  BUN 26* '15 15 15 '$ 23*  CREATININE 1.10* 0.89 1.19* 0.84 1.13*  CALCIUM 8.7* 8.8* 9.2 9.0 9.0   Liver Function Tests: Recent Labs  Lab 12/28/18 2057  AST 18  ALT 21  ALKPHOS 96  BILITOT 1.5*  PROT 7.4  ALBUMIN 3.6   Recent Labs  Lab 12/28/18 2057  LIPASE 27   No results for input(s): AMMONIA in the last 168 hours. CBC: Recent Labs  Lab 12/28/18 2057 12/29/18 0523 12/30/18 0444 01/01/19 0440 01/02/19 0444  WBC 9.9 8.6 8.3 8.0 10.6*  NEUTROABS 7.7 6.4  --   --  8.8*  HGB 13.7 13.3 12.4 12.7 12.3  HCT 42.3 40.6 38.8 38.5 37.4  MCV 87.6 89.0 89.6 89.3 89.3  PLT 298 262 292 405* 353   Cardiac Enzymes: No results for input(s): CKTOTAL, CKMB, CKMBINDEX, TROPONINI in the last 168 hours. BNP: Invalid input(s): POCBNP CBG: Recent Labs  Lab 01/01/19 0756 01/01/19 1310 01/01/19 1623  01/01/19 2110 01/02/19 0741  GLUCAP 221* 112* 122* 206* 146*   D-Dimer No results for input(s): DDIMER in the last 72 hours. Hgb A1c No results for input(s): HGBA1C in the last 72 hours. Lipid Profile No results for input(s): CHOL, HDL, LDLCALC, TRIG, CHOLHDL, LDLDIRECT in the last 72 hours. Thyroid function studies Recent Labs    12/30/18 1646  TSH 0.533   Anemia work up No results for input(s): VITAMINB12, FOLATE, FERRITIN, TIBC, IRON, RETICCTPCT in the last 72 hours. Urinalysis    Component Value Date/Time   COLORURINE ORANGE (A) 12/28/2018 2057   APPEARANCEUR TURBID (A) 12/28/2018 2057   LABSPEC 1.020 12/28/2018 2057   PHURINE 5.0 12/28/2018 2057   GLUCOSEU >=500 (A) 12/28/2018 2057   HGBUR TRACE (A) 12/28/2018 2057   BILIRUBINUR SMALL (A) 12/28/2018 2057   BILIRUBINUR negative 12/23/2018 0921   BILIRUBINUR Negative 02/15/2018 1147   KETONESUR 15 (A) 12/28/2018 2057   PROTEINUR 100 (A) 12/28/2018 2057   UROBILINOGEN 0.2 12/23/2018 0921   UROBILINOGEN 0.2 01/15/2016 1524   NITRITE POSITIVE (A) 12/28/2018 2057   LEUKOCYTESUR TRACE (A) 12/28/2018 2057   Sepsis Labs Invalid input(s): PROCALCITONIN,  WBC,  LACTICIDVEN Microbiology Recent Results (from the past 240 hour(s))  Urine culture     Status: None   Collection Time: 12/28/18  8:57 PM  Result Value Ref Range Status   Specimen Description   Final    URINE, RANDOM Performed at [facility], [address] [place], [place] [address]    Special Requests   Final    NONE Performed at [facility], [address] [place], [place] [address]    Culture   Final    NO GROWTH Performed at [facility], [place] [address], [place], [place] [address]    Report Status 12/30/2018 FINAL  Final  Blood culture (routine x 2)     Status: None (Preliminary result)   Collection Time: 12/28/18 10:24 PM  Result Value Ref Range Status   Specimen Description   Final    BLOOD LEFT  ANTECUBITAL Performed at [facility], [place] [address] [place], [place] [address]    Special Requests   Final    BOTTLES DRAWN AEROBIC AND ANAEROBIC Blood Culture adequate volume Performed at [facility], [address] [place], [place] [address]    Culture   Final    NO GROWTH 4 DAYS Performed at [facility], [place] [address], [place], [place] [address]    Report Status PENDING  Incomplete  SARS Coronavirus 2 (Hosp order,Performed in [facility] lab via Abbott ID)     Status: None   Collection Time: 12/28/18 10:47 PM  Result Value Ref Range Status   SARS Coronavirus 2 (Abbott ID Now) NEGATIVE NEGATIVE Final    Comment: (NOTE) Interpretive Result Comment(s): COVID 19 Positive SARS CoV 2 target nucleic acids are DETECTED. The SARS CoV 2 RNA is generally detectable in upper and lower respiratory specimens during the acute phase of infection.  Positive results are indicative of active infection with SARS CoV 2.  Clinical correlation with patient history and other diagnostic information  is necessary to determine patient infection status.  Positive results do not rule out bacterial infection or coinfection with other viruses. The expected result is Negative. COVID 19 Negative SARS CoV 2 target nucleic acids are NOT DETECTED. The SARS CoV 2 RNA is generally detectable in upper and lower respiratory specimens during the acute phase of infection.  Negative results do not preclude SARS CoV 2 infection, do not rule out coinfections with other pathogens, and should not be used as the sole basis for treatment or other patient management decisions.  Negative results must be combined with clinical  observations, patient history, and epidemiological information. The expected result is Negative. Invalid Presence or absence of SARS CoV 2 nucleic acids cannot be determined. Repeat testing was performed on the submitted specimen and repeated Invalid results were  obtained.  If clinically indicated, additional testing on a new specimen with an alternate test methodology [phone]) is advised.  The SARS CoV 2 RNA is generally detectable in upper and lower respiratory specimens during the acute phase of infection. The expected result is Negative. Fact Sheet for Patients:  GolfingFamily.no Fact Sheet for Healthcare Providers: https://www.hernandez-brewer.com/ This test is not yet approved or cleared by the [place] FDA and has been authorized for detection and/or diagnosis of SARS CoV 2 by FDA under an Emergency Use Authorization (EUA).  This EUA will remain in effect (meaning this test can be used) for the duration of the COVID19 d eclaration under Section 564(b)(1) of the Act, 21 U.S.C. section [phone] 3(b)(1), unless the authorization is terminated or revoked sooner. Performed at [facility], [address] [place], [place] [address]   CSF culture     Status: None (Preliminary result)   Collection Time: 01/01/19  1:06 PM  Result Value Ref Range Status   Specimen Description   Final    CSF Performed at [facility] [address], [place], [place] [address]    Special Requests   Final    NONE Performed at [facility], [place] [address], [place], [place] [address]    Gram Stain   Final    WBC PRESENT,BOTH PMN AND MONONUCLEAR NO ORGANISMS SEEN CYTOSPIN SMEAR Gram Stain Report Called to,Read Back By and Verified With: K.CARTER AT 0300 ON 01/01/19 BY N.THOMPSON Performed at [facility], [place] [address], [place], [place] [address]    Culture   Final    NO GROWTH < 12 HOURS Performed at [place] [address], [place], [place] [address]    Report Status PENDING  Incomplete     Time coordinating discharge: 45 minutes  SIGNED:   Tawni Millers, MD  [facility] 01/02/2019, 10:55 AM

## 2019-01-02 NOTE — Progress Notes (Signed)
Discharge teaching completed with pt. Verbalized understanding. Pt taken to front entrance via wheelchair by this nurse for discharge, with belongings.

## 2019-01-02 NOTE — Progress Notes (Signed)
Inpatient Diabetes Program Recommendations  AACE/ADA: New Consensus Statement on Inpatient Glycemic Control (2015)  Target Ranges:  Prepandial:   less than 140 mg/dL      Peak postprandial:   less than 180 mg/dL (1-2 hours)      Critically ill patients:  140 - 180 mg/dL   Lab Results  Component Value Date   GLUCAP 146 (H) 01/02/2019   HGBA1C 12.7 (A) 12/23/2018    Review of Glycemic Control  Spoke with Barbara Wang by telephone this am regarding her diabetes control. Discussed HgbA1C of 12.7% and importance of reducing this to 7-8%. Discussed lifestyle changes with diet, exercise, monitoring, and taking insulin as prescribed. Barbara Wang states she has not been checking her blood sugars on a regular basis. States she "usually" takes her Lantus, but forgets to take her Novolog at meals. States she eats at different times, and may not be home to take her insulin. We discussed taking insulin with her when she knows she'll be away from home at mealtime. Also talked about setting an alarm on her phone to check her blood sugar and take her insulin as prescribed. Barbara Wang states she will try it. Goes to [facility] for PCP and is able to get her insulin there. States she has problems getting other meds that are prescribed. Encouraged Barbara Wang to start exercising (walking) 30 minutes/day and reducing portion sizes of food. Discussed healthy diet and by losing weight, this will help her body use her own insulin. Barbara Wang voices understanding. Sounds somewhat depressed and did not want to go to OP Diabetes Education at this time. Encouraged Barbara Wang to take care of herself, eat healthy, exercise daily, check blood sugars and take insulin as prescribed. Barbara Wang to f/u at [facility] within week or two of discharge. Barbara Wang voices understanding.  Blood sugar much improved this am - 146 mg/dL.  Will continue to follow.  Thank you. Lorenda Peck, RD, LDN, CDE Inpatient Diabetes Coordinator [phone]

## 2019-01-03 LAB — CULTURE, BLOOD (ROUTINE X 2)
Culture: NO GROWTH
Special Requests: ADEQUATE

## 2019-01-05 LAB — CSF CULTURE W GRAM STAIN: Culture: NO GROWTH

## 2019-01-15 ENCOUNTER — Ambulatory Visit: Payer: Self-pay | Admitting: Pharmacist

## 2019-01-15 ENCOUNTER — Other Ambulatory Visit: Payer: Self-pay

## 2019-01-24 ENCOUNTER — Other Ambulatory Visit: Payer: Self-pay

## 2019-01-24 ENCOUNTER — Ambulatory Visit: Payer: Self-pay | Attending: Nurse Practitioner | Admitting: Pharmacist

## 2019-01-24 ENCOUNTER — Encounter: Payer: Self-pay | Admitting: Pharmacist

## 2019-01-24 DIAGNOSIS — IMO0001 Reserved for inherently not codable concepts without codable children: Secondary | ICD-10-CM

## 2019-01-24 DIAGNOSIS — E1165 Type 2 diabetes mellitus with hyperglycemia: Secondary | ICD-10-CM

## 2019-01-24 DIAGNOSIS — Z794 Long term (current) use of insulin: Secondary | ICD-10-CM

## 2019-01-24 NOTE — Progress Notes (Signed)
S:    PCP: Zelda  No chief complaint on file.  Patient arrives in good spirits. Presents for diabetes management at the request of Dr. Chapman Fitch. Patient was referred on 12/23/18.  Patient was last seen by Primary Care Provider on 05/17/18.   Patient reports diabetes was diagnosed in 2.   Family/Social History:  - FHx: DM (father), stroke (mother), HTN (mother) - Tobacco: current every day smoker 0.5 - 1 PPD - Alcohol: denies   Insurance coverage/medication affordability: self-pay  Patient denies adherence with medications. Misses a couple of doses a week.  Current diabetes medications include: Humalog 10 units w/meals, Lanuts 44 units BID (reports having to go back to 40 units BID) Current hypertension medications include: lisinopril-HCTZ 20-25 mg daily  Patient reports hypoglycemic events (gives readings 36, 69)  Patient reported dietary habits:  - Eats 1-2 meals/day; "it just depends" - Breakfast: pancakes, sausage, fresh fruit (cantalope, watermelon, grapes, strawberries, pineapple) - Lunch: skips  - Dinner: 8 PM - gives reason morning hypoglycemia; salads, pasta w/ chicken; burgers - "whatever is the fridge" - Snacks: eats early in the morning (3AM); peanut butter crackers - Drinks: Diet Mt. Dew, Diet Sunkist  - regular soda only when hypoglycemia  Patient-reported exercise habits: "I could - but I'm lazy"   Patient denies nocturia.  Patient denies any changes in neuropathy. Patient denies any current visual changes. Patient reports self foot exams.   O:  Blood sugar today: 149 (fasting)  Home fasting CBGs: gives range 100s-200s  2 hour post-prandial/random CBG: 100s-200s (2 outliers in the 300s).  Lab Results  Component Value Date   HGBA1C 12.7 (A) 12/23/2018   There were no vitals filed for this visit.  Lipid Panel     Component Value Date/Time   CHOL 133 05/17/2018 1544   TRIG 198 (H) 05/17/2018 1544   HDL 38 (L) 05/17/2018 1544   CHOLHDL 3.5  05/17/2018 1544   CHOLHDL 5.0 01/22/2016 1044   VLDL 25 01/22/2016 1044   LDLCALC 55 05/17/2018 1544   Clinical ASCVD: No  The 10-year ASCVD risk score Mikey Bussing DC Jr., et al., 2013) is: 12.9%   Values used to calculate the score:     Age: 53 years     Sex: Female     Is Non-Hispanic African American: Yes     Diabetic: Yes     Tobacco smoker: Yes     Systolic Blood Pressure: 454 mmHg     Is BP treated: No     HDL Cholesterol: 38 mg/dL     Total Cholesterol: 133 mg/dL   A/P: Diabetes longstanding currently uncontrolled. Patient is able to verbalize appropriate hypoglycemia management plan. Patient is not adherent with medication. Control is suboptimal due to dietary indiscretion, physical inactivity, and medication non-adherence.  Her sugars have improved overall since hospitalization earlier in May. She has problems with highs and lows concerning her sugars at home. I think Tyler Aas may offer better coverage; it may also help minimize risk of hypoglycemia. Counseled pt on diabetic diet and regular eating schedule that will also help minimize hypo risk.   We will have pt come in Monday (01/27/19) to enroll in MAP for Tresiba. She knows to continue current regimen for now.    -Continued  Lantus 40 units BID for now. Will switch to [place] when pt can come in Monday. -Continued  Humalog 10 units before meals.  -Extensively discussed pathophysiology of DM, recommended lifestyle interventions, dietary effects on glycemic control -Counseled on  s/sx of and management of hypoglycemia -Next A1C anticipated 02/2019.   ASCVD risk - primary prevention in patient with DM. Last LDL 55. controlled. ASCVD risk score is not >20%  - Tolerating atorvastatin well.  -Continued atorvastatin 40 mg.   HM:  - Vaccines UTD   Written patient instructions provided.  Total time counseling over the phone 15 minutes.   Follow up Pharmacist Clinic Visit on Monday.     Benard Halsted, PharmD, [place] [phone]

## 2019-01-25 ENCOUNTER — Other Ambulatory Visit: Payer: Self-pay | Admitting: Nurse Practitioner

## 2019-01-25 DIAGNOSIS — I10 Essential (primary) hypertension: Secondary | ICD-10-CM

## 2019-01-27 ENCOUNTER — Other Ambulatory Visit: Payer: Self-pay

## 2019-01-27 ENCOUNTER — Ambulatory Visit: Payer: Self-pay | Attending: Nurse Practitioner | Admitting: Pharmacist

## 2019-01-27 DIAGNOSIS — Z79899 Other long term (current) drug therapy: Secondary | ICD-10-CM

## 2019-01-27 MED ORDER — INSULIN GLARGINE 100 UNIT/ML SOLOSTAR PEN: 40.0000 [IU] | PEN_INJECTOR | Freq: Two times a day (BID) | SUBCUTANEOUS | 2 refills | Status: DC

## 2019-01-27 MED ORDER — LISINOPRIL-HYDROCHLOROTHIAZIDE 20-25 MG PO TABS: 1.0000 | ORAL_TABLET | Freq: Every day | ORAL | 1 refills | Status: DC

## 2019-01-27 MED FILL — $LANTUS SOLOSTAR 100 UNITS/: 100 | 30 days supply | Qty: 24 | Fill #0

## 2019-01-27 NOTE — Telephone Encounter (Signed)
Refill if appropriate please

## 2019-01-28 ENCOUNTER — Encounter: Payer: Self-pay | Admitting: Pharmacist

## 2019-01-28 MED FILL — LISINOPRIL-HCTZ 20-25 MG TA: 20-25 | 90 days supply | Qty: 90 | Fill #0

## 2019-01-28 NOTE — Progress Notes (Signed)
Patient was educated on the use of the Lantus, Humalog pens. Reviewed necessary supplies and operation of the pen. Also reviewed goal blood glucose levels. Patient was able to demonstrate use. All questions and concerns were addressed.

## 2019-02-03 ENCOUNTER — Ambulatory Visit: Payer: Self-pay | Admitting: Nurse Practitioner

## 2019-02-10 ENCOUNTER — Ambulatory Visit: Payer: Self-pay | Attending: Nurse Practitioner | Admitting: Nurse Practitioner

## 2019-02-10 ENCOUNTER — Encounter: Payer: Self-pay | Admitting: Nurse Practitioner

## 2019-02-10 ENCOUNTER — Other Ambulatory Visit: Payer: Self-pay

## 2019-02-10 DIAGNOSIS — F17200 Nicotine dependence, unspecified, uncomplicated: Secondary | ICD-10-CM

## 2019-02-10 DIAGNOSIS — I10 Essential (primary) hypertension: Secondary | ICD-10-CM

## 2019-02-10 DIAGNOSIS — E230 Hypopituitarism: Secondary | ICD-10-CM

## 2019-02-10 DIAGNOSIS — F17210 Nicotine dependence, cigarettes, uncomplicated: Secondary | ICD-10-CM

## 2019-02-10 DIAGNOSIS — Z794 Long term (current) use of insulin: Secondary | ICD-10-CM

## 2019-02-10 DIAGNOSIS — IMO0001 Reserved for inherently not codable concepts without codable children: Secondary | ICD-10-CM

## 2019-02-10 DIAGNOSIS — E119 Type 2 diabetes mellitus without complications: Secondary | ICD-10-CM

## 2019-02-10 MED ORDER — NICOTINE 7 MG/24HR TD PT24: 7.0000 mg | MEDICATED_PATCH | Freq: Every day | TRANSDERMAL | 0 refills | Status: AC

## 2019-02-10 MED ORDER — NICOTINE 14 MG/24HR TD PT24: 14.0000 mg | MEDICATED_PATCH | Freq: Every day | TRANSDERMAL | 0 refills | Status: AC

## 2019-02-10 MED ORDER — NICOTINE 21 MG/24HR TD PT24: 21.0000 mg | MEDICATED_PATCH | Freq: Every day | TRANSDERMAL | 0 refills | Status: AC

## 2019-02-10 NOTE — Progress Notes (Signed)
Virtual Visit via Telephone Note Due to national recommendations of social distancing due to [name], telehealth visit is felt to be most appropriate for this patient at this time.  I discussed the limitations, risks, security and privacy concerns of performing an evaluation and management service by telephone and the availability of in person appointments. I also discussed with the patient that there may be a patient responsible charge related to this service. The patient expressed understanding and agreed to proceed.    I connected with Barbara Wang on 02/10/19  at   9:10 AM EDT  EDT by telephone and verified that I am speaking with the correct person using two identifiers.   Consent I discussed the limitations, risks, security and privacy concerns of performing an evaluation and management service by telephone and the availability of in person appointments. I also discussed with the patient that there may be a patient responsible charge related to this service. The patient expressed understanding and agreed to proceed.   Location of Patient: Private  Residence    Location of Provider: [place] and [employer] Office    Persons participating in Telemedicine visit: Geryl Rankins FNP-BC [place]    History of Present Illness: Telemedicine visit for: Follow up  has a past medical history of Asthma, Brain tumor ([place]), Cellulitis (06/2015), Depression, History of hiatal hernia, Hyperlipemia, Hypertension, Neuropathy, Obesity, Pancreatitis, and Type II diabetes mellitus ([place]).   DM TYPE 2 Blood sugar this morning 336.  States blood glucose reading averages: 200s.  She had cabbage, grilled chicken, cherry pie and rice last night. She also had a diet mountain dew.  She has been nonadherent in regard to dietary and exercise compliance.  Endorses medication compliance taking Lantus 40 units twice daily and Humalog 10 units 3 times daily.  Would like  to start patient on Tresiba however still waiting for patient to bring in documents that are needed for the medication assistance program.  She is overdue for eye exam. Lab Results  Component Value Date   HGBA1C 12.7 (A) 12/23/2018    Essential Hypertension Controlled.  She has a blood pressure monitor at home. States she only checks "when I get a headache and I know my pressure is high".  She endorses medication compliance taking Zestoretic 20-25 mg daily. Denies chest pain, shortness of breath, palpitations, lightheadedness, dizziness, headaches or BLE edema.  BP Readings from Last 3 Encounters:  01/02/19 124/66  12/23/18 (!) 95/56  09/05/18 133/82   Past Medical History:  Diagnosis Date  . Asthma   . Brain tumor ([place])   . Cellulitis 06/2015   rt hand   . Depression   . History of hiatal hernia    "it went away on it's own"  . Hyperlipemia   . Hypertension   . Neuropathy     "feet & hands "  . Obesity   . Pancreatitis   . Type II diabetes mellitus ([facility])    insulin dependent     Past Surgical History:  Procedure Laterality Date  . ABDOMINAL HYSTERECTOMY    . [place]; 1987  . CHOLECYSTECTOMY  05/11/2012   Procedure: LAPAROSCOPIC CHOLECYSTECTOMY WITH INTRAOPERATIVE CHOLANGIOGRAM;  Surgeon: Gayland Curry, MD,FACS;  Location: [place];  Service: General;  Laterality: N/A;  . SHOULDER ARTHROSCOPY W/ ROTATOR CUFF REPAIR Right   . TUBAL LIGATION  10/25/1999   Archie Endo 01/11/2011    Family History  Problem Relation Age of Onset  .  Stroke Mother   . Diabetes Father   . Diabetes Sister     Social History   Socioeconomic History  . Marital status: Single    Spouse name: Not on file  . Number of children: Not on file  . Years of education: Not on file  . Highest education level: Not on file  Occupational History  . Not on file  Social Needs  . Financial resource strain: Not on file  . Food insecurity    Worry: Not on file    Inability: Not on file  .  Transportation needs    Medical: Not on file    Non-medical: Not on file  Tobacco Use  . Smoking status: Current Every Day Smoker    Packs/day: 1.00    Years: 40.00    Pack years: 40.00    Types: Cigarettes  . Smokeless tobacco: Never Used  Substance and Sexual Activity  . Alcohol use: No  . Drug use: No  . Sexual activity: Yes    Birth control/protection: Surgical  Lifestyle  . Physical activity    Days per week: Not on file    Minutes per session: Not on file  . Stress: Not on file  Relationships  . Social Herbalist on phone: Not on file    Gets together: Not on file    Attends religious service: Not on file    Active member of club or organization: Not on file    Attends meetings of clubs or organizations: Not on file    Relationship status: Not on file  Other Topics Concern  . Not on file  Social History Narrative  . Not on file     Observations/Objective: Awake, alert and oriented x 3   Review of Systems  Constitutional: Negative for fever, malaise/fatigue and weight loss.  HENT: Negative.  Negative for nosebleeds.   Eyes: Negative.  Negative for blurred vision, double vision and photophobia.  Respiratory: Negative.  Negative for cough and shortness of breath.   Cardiovascular: Negative.  Negative for chest pain, palpitations and leg swelling.  Gastrointestinal: Negative.  Negative for heartburn, nausea and vomiting.  Musculoskeletal: Negative.  Negative for myalgias.  Neurological: Negative.  Negative for dizziness, focal weakness, seizures and headaches.  Psychiatric/Behavioral: Negative for suicidal ideas. The patient has insomnia.     Assessment and Plan: [place] was seen today for follow-up.  Diagnoses and all orders for this visit:  Essential hypertension Continue all antihypertensives as prescribed.  Remember to bring in your blood pressure log with you for your follow up appointment.  DASH/Mediterranean Diets are healthier choices  for HTN.    Hypopituitarism ([place]) -     Prolactin; Future  Insulin dependent diabetes mellitus ([place]) Diabetes is poorly controlled. Advised patient to keep a fasting blood sugar log fast, 2 hours post lunch and bedtime which will be reviewed at the next office visit.  Continue blood sugar control as discussed in office today, low carbohydrate diet, and regular physical exercise as tolerated, 150 minutes per week (30 min each day, 5 days per week, or 50 min 3 days per week). Keep blood sugar logs with fasting goal of 90-130 mg/dl, post prandial (after you eat) less than 180.  For Hypoglycemia: BS <60 and Hyperglycemia BS >400; contact the clinic ASAP. Annual eye exams and foot exams are recommended.   Tobacco dependence -     nicotine (NICODERM CQ - DOSED IN MG/24 HOURS) 21 mg/24hr patch; Place 1 patch (  21 mg total) onto the skin daily. -     nicotine (NICODERM CQ - DOSED IN MG/24 HOURS) 14 mg/24hr patch; Place 1 patch (14 mg total) onto the skin daily for 28 days. -     nicotine (NICODERM CQ - DOSED IN MG/24 HR) 7 mg/24hr patch; Place 1 patch (7 mg total) onto the skin daily for 21 days. [place] continues to smoke 1Pack of cigarettes per day. 2. Kalya was counseled on the dangers of tobacco use, and was advised to quit. We reviewed specific strategies to maximize success, including removing cigarettes and smoking materials from environment, stress management and support of family/friends as well as pharmacological alternatives. 3. A total of 4 minutes was spent on counseling for smoking cessation and Nona is ready to quit and has chosen nicotine patches to start today. She has tired chantix in the past and was unsuccessful 4. Tessah was offered Wellbutrin, Chantix, Nicotine patch, Nicotine gum or lozenges.  Due to out of pocket costs Rilyn was also given smoking cessation support and advised to contact: the Smoking Cessation hotline: 1-800-QUIT-NOW.  Haeleigh was  also informed of our Smoking cessation classes which are also available through [facility] by calling [phone] or visit our website at [email or web address]  5. Will follow up at next scheduled office visit.      Follow Up Instructions Return in about 2 months (around 04/12/2019).     I discussed the assessment and treatment plan with the patient. The patient was provided an opportunity to ask questions and all were answered. The patient agreed with the plan and demonstrated an understanding of the instructions.   The patient was advised to call back or seek an in-person evaluation if the symptoms worsen or if the condition fails to improve as anticipated.  I provided 24 minutes of non-face-to-face time during this encounter including median intraservice time, reviewing previous notes, labs, imaging, medications and explaining diagnosis and management.  Gildardo Pounds, FNP-BC

## 2019-02-21 MED FILL — NICOTINE 21 MG/24HR PATCH: 21 | 28 days supply | Qty: 28 | Fill #0

## 2019-02-21 MED FILL — traZODone HCL 100 MG TABS: 100 | 30 days supply | Qty: 30 | Fill #1

## 2019-02-24 ENCOUNTER — Other Ambulatory Visit: Payer: Self-pay

## 2019-02-24 ENCOUNTER — Ambulatory Visit: Payer: Self-pay | Attending: Family Medicine | Admitting: Pharmacist

## 2019-02-24 DIAGNOSIS — IMO0001 Reserved for inherently not codable concepts without codable children: Secondary | ICD-10-CM

## 2019-02-24 DIAGNOSIS — Z794 Long term (current) use of insulin: Secondary | ICD-10-CM

## 2019-02-24 DIAGNOSIS — E119 Type 2 diabetes mellitus without complications: Secondary | ICD-10-CM

## 2019-02-24 NOTE — Progress Notes (Signed)
S:    PCP: Zelda  No chief complaint on file.  Patient arrives in good spirits. Presents for diabetes management at the request of Dr. Chapman Fitch. Patient was referred on 12/23/18.  Patient was last seen by Primary Care Provider on 05/17/18. I last saw her in clinic on 01/27/19 and instructed her on proper insulin injection technique.  Virtual Visit via Telephone Note  I connected withPhileathea Wang on 6/29/2020at 11 AM EDTby telephoneand verified that I am speaking with the correct person using two identifiers.  I discussed the limitations, risks, security and privacy concerns of performing an evaluation and management service by telephone and the availability of in person appointments. I also discussed with the patient that there may be a patient responsible charge related to this service. The patient expressed understanding and agreed to proceed.  Patient reports diabetes was diagnosed in 1.   Family/Social History:  - FHx: DM (father), stroke (mother), HTN (mother) - Tobacco: current every day smoker 0.5 - 1 PPD - Alcohol: denies   Insurance coverage/medication affordability: self-pay  Patient reports adherence with medications.  Current diabetes medications include: Humalog 10 units w/meals, Lanuts 42 units BID  Current hypertension medications include: lisinopril-HCTZ 20-25 mg daily  Patient denies hypoglycemic events.   Patient reported dietary habits:  - Eats 1-2 meals/day; "it just depends" - Breakfast: pancakes, sausage, fresh fruit (cantalope, watermelon, grapes, strawberries, pineapple) - Lunch: skips  - Dinner: 8 PM - gives reason morning hypoglycemia; salads, pasta w/ chicken; burgers - "whatever is the fridge" - Snacks: eats early in the morning (3AM); peanut butter crackers - Drinks: Diet Mt. Dew, Diet Sunkist  - regular soda only when hypoglycemia  Patient-reported exercise habits: "I could - but I'm lazy"   Patient denies nocturia.  Patient denies  any changes in neuropathy. Patient denies any current visual changes. Patient reports self foot exams.   O:  Blood sugar today: 149 (fasting)  Home fasting CBGs: gives range 100s 2 hour post-prandial/random CBG: 100s-200s (no readings in the 300s)  Lab Results  Component Value Date   HGBA1C 12.7 (A) 12/23/2018   There were no vitals filed for this visit.  Lipid Panel     Component Value Date/Time   CHOL 133 05/17/2018 1544   TRIG 198 (H) 05/17/2018 1544   HDL 38 (L) 05/17/2018 1544   CHOLHDL 3.5 05/17/2018 1544   CHOLHDL 5.0 01/22/2016 1044   VLDL 25 01/22/2016 1044   LDLCALC 55 05/17/2018 1544   Clinical ASCVD: No  The 10-year ASCVD risk score Mikey Bussing DC Jr., et al., 2013) is: 19.1%   Values used to calculate the score:     Age: 53 years     Sex: Female     Is Non-Hispanic African American: Yes     Diabetic: Yes     Tobacco smoker: Yes     Systolic Blood Pressure: 774 mmHg     Is BP treated: Yes     HDL Cholesterol: 38 mg/dL     Total Cholesterol: 133 mg/dL   A/P: Diabetes longstanding currently uncontrolled. Patient is able to verbalize appropriate hypoglycemia management plan. Patient  adherent with medication. Control is suboptimal due to dietary indiscretion, physical inactivity.  -Increase Lantus to 46 units BID -Continued  Humalog 10 units before meals.  -Extensively discussed pathophysiology of DM, recommended lifestyle interventions, dietary effects on glycemic control -Counseled on s/sx of and management of hypoglycemia -Next A1C anticipated 02/2019.   ASCVD risk - primary prevention in patient  with DM. Last LDL 55. controlled. ASCVD risk score is not >20%  - Tolerating atorvastatin well.  -Continued atorvastatin 40 mg.   HM:  - Vaccines UTD   Written patient instructions provided.  Total time counseling over the phone 15 minutes.   Follow up w/ PCP 04/14/19.  Benard Halsted, PharmD, [place]  [phone]

## 2019-02-25 ENCOUNTER — Encounter: Payer: Self-pay | Admitting: Pharmacist

## 2019-02-27 ENCOUNTER — Other Ambulatory Visit: Payer: Self-pay

## 2019-04-07 MED FILL — $LANTUS SOLOSTAR 100 UNITS/: 100 | 30 days supply | Qty: 24 | Fill #1

## 2019-04-08 MED FILL — traZODone HCL 100 MG TABS: 100 | 30 days supply | Qty: 30 | Fill #2

## 2019-04-14 ENCOUNTER — Other Ambulatory Visit: Payer: Self-pay

## 2019-04-14 ENCOUNTER — Ambulatory Visit: Payer: Self-pay | Attending: Nurse Practitioner | Admitting: Nurse Practitioner

## 2019-04-14 ENCOUNTER — Encounter: Payer: Self-pay | Admitting: Nurse Practitioner

## 2019-04-14 DIAGNOSIS — E1165 Type 2 diabetes mellitus with hyperglycemia: Secondary | ICD-10-CM

## 2019-04-14 DIAGNOSIS — N289 Disorder of kidney and ureter, unspecified: Secondary | ICD-10-CM

## 2019-04-14 DIAGNOSIS — E230 Hypopituitarism: Secondary | ICD-10-CM

## 2019-04-14 DIAGNOSIS — E782 Mixed hyperlipidemia: Secondary | ICD-10-CM

## 2019-04-14 NOTE — Progress Notes (Signed)
Virtual Visit via Telephone Note Due to national recommendations of social distancing due to [place] 19, telehealth visit is felt to be most appropriate for this patient at this time.  I discussed the limitations, risks, security and privacy concerns of performing an evaluation and management service by telephone and the availability of in person appointments. I also discussed with the patient that there may be a patient responsible charge related to this service. The patient expressed understanding and agreed to proceed.    I connected with Barbara Wang on 04/14/19  at  10:30 AM EDT  EDT by telephone and verified that I am speaking with the correct person using two identifiers.   Consent I discussed the limitations, risks, security and privacy concerns of performing an evaluation and management service by telephone and the availability of in person appointments. I also discussed with the patient that there may be a patient responsible charge related to this service. The patient expressed understanding and agreed to proceed.   Location of Patient: Private  Residence   Location of Provider: [facility] [employer] Office    Persons participating in Telemedicine visit: Barbara Rankins FNP-BC Barbara Wang    History of Present Illness: Telemedicine visit for: Follow up  has a past medical history of Asthma, Brain tumor ([place]), Cellulitis (06/2015), Depression, History of hiatal hernia, Hyperlipemia, Hypertension, Neuropathy, Obesity, Pancreatitis, and Type II diabetes mellitus ([place]).  DM TYPE 2 Not well controlled. She saw the pharmacist last month of medication adjustment. At that time her  diabetes medications included: Humalog 10 units w/meals, Lanuts 42 units BID. Based on readings her Lantus was increased to 46 units BID and and no changes were made with humalog however patient states she is administering 12units of humalog TID.Marland Kitchen  Today she reports  fasting reading this morning 90s. Average Fasting 90-120, Postprandial 180s. Feels her numbers have decreased with the change in lantus dosing. She denies any hypoglycemic symptoms. Taking statin and ACE. Hyperglycemic symptoms include neuropathy.  Lab Results  Component Value Date   HGBA1C 12.7 (A) 12/23/2018    Mixed Hyperlipidemia LDL at goal taking atorvastatin 40 mg daily. Denies any statin intolerance or myalgias.  Lab Results  Component Value Date   LDLCALC 55 05/17/2018   Essential Hypertension She does not have a BP monitor. Taking zestoretic 20-25 mg tablets daily. Still smoking. She has not started her nicotine patches but states she will be starting soon. Denies chest pain, shortness of breath, palpitations, lightheadedness, dizziness, headaches or BLE edema.  Past Medical History:  Diagnosis Date  . Asthma   . Brain tumor ([place])   . Cellulitis 06/2015   rt hand   . Depression   . History of hiatal hernia    "it went away on it's own"  . Hyperlipemia   . Hypertension   . Neuropathy     "feet & hands "  . Obesity   . Pancreatitis   . Type II diabetes mellitus (H[facility])    insulin dependent     Past Surgical History:  Procedure Laterality Date  . ABDOMINAL HYSTERECTOMY    . [place]; 1987  . CHOLECYSTECTOMY  05/11/2012   Procedure: LAPAROSCOPIC CHOLECYSTECTOMY WITH INTRAOPERATIVE CHOLANGIOGRAM;  Surgeon: Gayland Curry, MD,FACS;  Location: [facility];  Service: General;  Laterality: N/A;  . SHOULDER ARTHROSCOPY W/ ROTATOR CUFF REPAIR Right   . TUBAL LIGATION  10/25/1999   Archie Endo 01/11/2011    Family History  Problem  Relation Age of Onset  . Stroke Mother   . Diabetes Father   . Diabetes Sister     Social History   Socioeconomic History  . Marital status: Single    Spouse name: Not on file  . Number of children: Not on file  . Years of education: Not on file  . Highest education level: Not on file  Occupational History  . Not on file  Social Needs   . Financial resource strain: Not on file  . Food insecurity    Worry: Not on file    Inability: Not on file  . Transportation needs    Medical: Not on file    Non-medical: Not on file  Tobacco Use  . Smoking status: Current Every Day Smoker    Packs/day: 1.00    Years: 40.00    Pack years: 40.00    Types: Cigarettes  . Smokeless tobacco: Never Used  Substance and Sexual Activity  . Alcohol use: No  . Drug use: No  . Sexual activity: Yes    Birth control/protection: Surgical  Lifestyle  . Physical activity    Days per week: Not on file    Minutes per session: Not on file  . Stress: Not on file  Relationships  . Social Herbalist on phone: Not on file    Gets together: Not on file    Attends religious service: Not on file    Active member of club or organization: Not on file    Attends meetings of clubs or organizations: Not on file    Relationship status: Not on file  Other Topics Concern  . Not on file  Social History Narrative  . Not on file     Observations/Objective: Awake, alert and oriented x 3   Review of Systems  Constitutional: Negative for fever, malaise/fatigue and weight loss.  HENT: Negative.  Negative for nosebleeds.   Eyes: Negative.  Negative for blurred vision, double vision and photophobia.  Respiratory: Negative.  Negative for cough and shortness of breath.   Cardiovascular: Negative.  Negative for chest pain, palpitations and leg swelling.  Gastrointestinal: Negative.  Negative for heartburn, nausea and vomiting.  Musculoskeletal: Negative.  Negative for myalgias.  Neurological: Positive for sensory change. Negative for dizziness, focal weakness, seizures and headaches.  Psychiatric/Behavioral: Negative.  Negative for suicidal ideas.    Assessment and Plan: [name] was seen today for follow-up.  Diagnoses and all orders for this visit:  Uncontrolled type 2 diabetes mellitus with hyperglycemia ([place]) -     Hemoglobin A1c;  Future  Mixed hyperlipidemia -     Lipid panel; Future  Mild renal insufficiency -     CMP14+EGFR; Future     Follow Up Instructions No follow-ups on file.     I discussed the assessment and treatment plan with the patient. The patient was provided an opportunity to ask questions and all were answered. The patient agreed with the plan and demonstrated an understanding of the instructions.   The patient was advised to call back or seek an in-person evaluation if the symptoms worsen or if the condition fails to improve as anticipated.  I provided 25 minutes of non-face-to-face time during this encounter including median intraservice time, reviewing previous notes, labs, imaging, medications and explaining diagnosis and management.  Gildardo Pounds, FNP-BC

## 2019-04-28 ENCOUNTER — Telehealth (HOSPITAL_COMMUNITY): Payer: Self-pay

## 2019-04-28 NOTE — Telephone Encounter (Signed)
Telephoned patient at home number. Left message to call and schedule with [facility]. °

## 2019-05-15 ENCOUNTER — Other Ambulatory Visit: Payer: Self-pay | Admitting: Pharmacist

## 2019-05-15 MED ORDER — LANTUS SOLOSTAR 100 UNIT/ML ~~LOC~~ SOPN: 46.0000 [IU] | PEN_INJECTOR | Freq: Two times a day (BID) | SUBCUTANEOUS | 2 refills | Status: DC

## 2019-05-15 MED FILL — $LANTUS SOLOSTAR 100 UNITS/: 100 | 31 days supply | Qty: 30 | Fill #0

## 2019-05-15 MED FILL — $HUMALOG 100 UNITS/ML KWIKP: 100 | 30 days supply | Qty: 9 | Fill #3

## 2019-06-20 ENCOUNTER — Emergency Department (HOSPITAL_BASED_OUTPATIENT_CLINIC_OR_DEPARTMENT_OTHER)
Admission: EM | Admit: 2019-06-20 | Discharge: 2019-06-20 | Disposition: A | Discharge status: Home / Self Care | Payer: Self-pay | Attending: Emergency Medicine | Admitting: Emergency Medicine

## 2019-06-20 ENCOUNTER — Other Ambulatory Visit: Payer: Self-pay

## 2019-06-20 ENCOUNTER — Emergency Department (HOSPITAL_BASED_OUTPATIENT_CLINIC_OR_DEPARTMENT_OTHER): Payer: Self-pay

## 2019-06-20 ENCOUNTER — Encounter (HOSPITAL_BASED_OUTPATIENT_CLINIC_OR_DEPARTMENT_OTHER): Payer: Self-pay | Admitting: Emergency Medicine

## 2019-06-20 DIAGNOSIS — Z881 Allergy status to other antibiotic agents status: Secondary | ICD-10-CM | POA: Insufficient documentation

## 2019-06-20 DIAGNOSIS — M25531 Pain in right wrist: Secondary | ICD-10-CM | POA: Insufficient documentation

## 2019-06-20 DIAGNOSIS — Z79899 Other long term (current) drug therapy: Secondary | ICD-10-CM | POA: Insufficient documentation

## 2019-06-20 DIAGNOSIS — Z880 Allergy status to penicillin: Secondary | ICD-10-CM | POA: Insufficient documentation

## 2019-06-20 DIAGNOSIS — J45909 Unspecified asthma, uncomplicated: Secondary | ICD-10-CM | POA: Insufficient documentation

## 2019-06-20 DIAGNOSIS — E119 Type 2 diabetes mellitus without complications: Secondary | ICD-10-CM | POA: Insufficient documentation

## 2019-06-20 DIAGNOSIS — I10 Essential (primary) hypertension: Secondary | ICD-10-CM | POA: Insufficient documentation

## 2019-06-20 DIAGNOSIS — Z882 Allergy status to sulfonamides status: Secondary | ICD-10-CM | POA: Insufficient documentation

## 2019-06-20 DIAGNOSIS — Z794 Long term (current) use of insulin: Secondary | ICD-10-CM | POA: Insufficient documentation

## 2019-06-20 DIAGNOSIS — Z885 Allergy status to narcotic agent status: Secondary | ICD-10-CM | POA: Insufficient documentation

## 2019-06-20 MED ORDER — MELOXICAM 7.5 MG PO TABS: 15.0000 mg | ORAL_TABLET | Freq: Every day | ORAL | 0 refills | Status: AC

## 2019-06-20 NOTE — ED Triage Notes (Signed)
Pt sts she woke up with pain in RT wrist and difficulty moving it at the joint; she noticed a bump on her hand that had not been there last night
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
## 2019-07-22 ENCOUNTER — Ambulatory Visit (HOSPITAL_BASED_OUTPATIENT_CLINIC_OR_DEPARTMENT_OTHER): Payer: Self-pay | Admitting: Pharmacist

## 2019-07-22 ENCOUNTER — Other Ambulatory Visit: Payer: Self-pay

## 2019-07-22 ENCOUNTER — Encounter: Payer: Self-pay | Admitting: Critical Care Medicine

## 2019-07-22 ENCOUNTER — Ambulatory Visit: Payer: Self-pay | Attending: Critical Care Medicine | Admitting: Critical Care Medicine

## 2019-07-22 VITALS — BP 153/92 | HR 81 | Temp 98.5°F | Resp 16 | Wt 204.0 lb

## 2019-07-22 DIAGNOSIS — E782 Mixed hyperlipidemia: Secondary | ICD-10-CM | POA: Insufficient documentation

## 2019-07-22 DIAGNOSIS — Z23 Encounter for immunization: Secondary | ICD-10-CM

## 2019-07-22 DIAGNOSIS — E1140 Type 2 diabetes mellitus with diabetic neuropathy, unspecified: Secondary | ICD-10-CM

## 2019-07-22 DIAGNOSIS — G4700 Insomnia, unspecified: Secondary | ICD-10-CM

## 2019-07-22 DIAGNOSIS — E230 Hypopituitarism: Secondary | ICD-10-CM

## 2019-07-22 DIAGNOSIS — N289 Disorder of kidney and ureter, unspecified: Secondary | ICD-10-CM

## 2019-07-22 DIAGNOSIS — Z1159 Encounter for screening for other viral diseases: Secondary | ICD-10-CM

## 2019-07-22 DIAGNOSIS — E1165 Type 2 diabetes mellitus with hyperglycemia: Secondary | ICD-10-CM

## 2019-07-22 DIAGNOSIS — M25531 Pain in right wrist: Secondary | ICD-10-CM | POA: Insufficient documentation

## 2019-07-22 DIAGNOSIS — Z794 Long term (current) use of insulin: Secondary | ICD-10-CM

## 2019-07-22 DIAGNOSIS — I10 Essential (primary) hypertension: Secondary | ICD-10-CM

## 2019-07-22 LAB — GLUCOSE, POCT (MANUAL RESULT ENTRY): POC Glucose: 121 mg/dl — AB (ref 70–99)

## 2019-07-22 MED ORDER — GABAPENTIN 600 MG PO TABS: 600.0000 mg | ORAL_TABLET | Freq: Three times a day (TID) | ORAL | 3 refills | Status: DC

## 2019-07-22 MED ORDER — TRAZODONE HCL 100 MG PO TABS: 100.0000 mg | ORAL_TABLET | Freq: Every day | ORAL | 2 refills | Status: DC

## 2019-07-22 MED ORDER — ATORVASTATIN CALCIUM 40 MG PO TABS: 40.0000 mg | ORAL_TABLET | Freq: Every day | ORAL | 3 refills | Status: DC

## 2019-07-22 MED ORDER — LANTUS SOLOSTAR 100 UNIT/ML ~~LOC~~ SOPN: 46.0000 [IU] | PEN_INJECTOR | Freq: Two times a day (BID) | SUBCUTANEOUS | 2 refills | Status: DC

## 2019-07-22 MED ORDER — IBUPROFEN 200 MG PO TABS: 800.0000 mg | ORAL_TABLET | Freq: Four times a day (QID) | ORAL | 1 refills | Status: DC | PRN

## 2019-07-22 MED ORDER — LISINOPRIL-HYDROCHLOROTHIAZIDE 20-25 MG PO TABS: 1.0000 | ORAL_TABLET | Freq: Every day | ORAL | 1 refills | Status: DC

## 2019-07-22 MED ORDER — INSULIN LISPRO (1 UNIT DIAL) 100 UNIT/ML (KWIKPEN): 12.0000 [IU] | PEN_INJECTOR | Freq: Three times a day (TID) | SUBCUTANEOUS | 3 refills | Status: DC

## 2019-07-22 MED FILL — LISINOPRIL-HCTZ 20-25 MG TA: 20-25 | 30 days supply | Qty: 30 | Fill #0

## 2019-07-22 MED FILL — $HUMALOG 100 UNITS/ML KWIKP: 100 | 24 days supply | Qty: 9 | Fill #0

## 2019-07-22 MED FILL — traZODone HCL 100 MG TABS: 100 | 30 days supply | Qty: 30 | Fill #0

## 2019-07-22 MED FILL — $LANTUS SOLOSTAR 100 UNITS/: 100 | 32 days supply | Qty: 30 | Fill #0

## 2019-07-22 NOTE — Patient Instructions (Signed)
Influenza Virus Vaccine injection (Fluarix) What is this medicine? INFLUENZA VIRUS VACCINE (in floo EN zuh VAHY ruhs vak SEEN) helps to reduce the risk of getting influenza also known as the flu. This medicine may be used for other purposes; ask your health care provider or pharmacist if you have questions. COMMON BRAND NAME(S): Fluarix, Fluzone What should I tell my health care provider before I take this medicine? They need to know if you have any of these conditions:  bleeding disorder like hemophilia  fever or infection  Guillain-Barre syndrome or other neurological problems  immune system problems  infection with the human immunodeficiency virus (HIV) or AIDS  low blood platelet counts  multiple sclerosis  an unusual or allergic reaction to influenza virus vaccine, eggs, chicken proteins, latex, gentamicin, other medicines, foods, dyes or preservatives  pregnant or trying to get pregnant  breast-feeding How should I use this medicine? This vaccine is for injection into a muscle. It is given by a health care professional. A copy of Vaccine Information Statements will be given before each vaccination. Read this sheet carefully each time. The sheet may change frequently. Talk to your pediatrician regarding the use of this medicine in children. Special care may be needed. Overdosage: If you think you have taken too much of this medicine contact a poison control center or emergency room at once. NOTE: This medicine is only for you. Do not share this medicine with others. What if I miss a dose? This does not apply. What may interact with this medicine?  chemotherapy or radiation therapy  medicines that lower your immune system like etanercept, anakinra, infliximab, and adalimumab  medicines that treat or prevent blood clots like warfarin  phenytoin  steroid medicines like prednisone or cortisone  theophylline  vaccines This list may not describe all possible  interactions. Give your health care provider a list of all the medicines, herbs, non-prescription drugs, or dietary supplements you use. Also tell them if you smoke, drink alcohol, or use illegal drugs. Some items may interact with your medicine. What should I watch for while using this medicine? Report any side effects that do not go away within 3 days to your doctor or health care professional. Call your health care provider if any unusual symptoms occur within 6 weeks of receiving this vaccine. You may still catch the flu, but the illness is not usually as bad. You cannot get the flu from the vaccine. The vaccine will not protect against colds or other illnesses that may cause fever. The vaccine is needed every year. What side effects may I notice from receiving this medicine? Side effects that you should report to your doctor or health care professional as soon as possible:  allergic reactions like skin rash, itching or hives, swelling of the face, lips, or tongue Side effects that usually do not require medical attention (report to your doctor or health care professional if they continue or are bothersome):  fever  headache  muscle aches and pains  pain, tenderness, redness, or swelling at site where injected  weak or tired This list may not describe all possible side effects. Call your doctor for medical advice about side effects. You may report side effects to FDA at 1-800-FDA-1088. Where should I keep my medicine? This vaccine is only given in a clinic, pharmacy, doctor's office, or other health care setting and will not be stored at home. NOTE: This sheet is a summary. It may not cover all possible information. If you have questions   about this medicine, talk to your doctor, pharmacist, or health care provider.  2020 Elsevier/Gold Standard (2008-03-11 09:30:40)  

## 2019-07-22 NOTE — Progress Notes (Signed)
Subjective:    Patient ID: Barbara Wang, female    DOB: July 28, 1966, 53 y.o.   MRN: [id number]  53 y.o.M with T2DM HTN asthma,    The patient is here in emergency room follow-up of right hand pain.  She states for the past several months she awakens with pain in her right hand on the dorsum.  It begins in the wrist and goes into the dorsum of the hand to the fingers.  The fifth digit is numb and if she holds anything in her hands this causes increased pain.  She does have a brace to wear on her hand.  She is a diabetic and her blood sugars have been in the 100 range and is 121 this morning if she comes into the office.    The patient denies any fever or injuries to the hand.  She does note swelling on the dorsum of the hand at times almost like a bubble is over one of the tendons.  Past Medical History:  Diagnosis Date  . Asthma   . Brain tumor ([place])   . Cellulitis 06/2015   rt hand   . Depression   . History of hiatal hernia    "it went away on it's own"  . Hyperlipemia   . Hypertension   . Neuropathy     "feet & hands "  . Obesity   . Pancreatitis   . Type II diabetes mellitus (HCC)    insulin dependent      Family History  Problem Relation Age of Onset  . Stroke Mother   . Diabetes Father   . Diabetes Sister      Social History   Socioeconomic History  . Marital status: Single    Spouse name: Not on file  . Number of children: Not on file  . Years of education: Not on file  . Highest education level: Not on file  Occupational History  . Not on file  Social Needs  . Financial resource strain: Not on file  . Food insecurity    Worry: Not on file    Inability: Not on file  . Transportation needs    Medical: Not on file    Non-medical: Not on file  Tobacco Use  . Smoking status: Current Every Day Smoker    Packs/day: 1.00    Years: 40.00    Pack years: 40.00    Types: Cigarettes  . Smokeless tobacco: Never Used  Substance and Sexual Activity  .  Alcohol use: No  . Drug use: No  . Sexual activity: Yes    Birth control/protection: Surgical  Lifestyle  . Physical activity    Days per week: Not on file    Minutes per session: Not on file  . Stress: Not on file  Relationships  . Social Herbalist on phone: Not on file    Gets together: Not on file    Attends religious service: Not on file    Active member of club or organization: Not on file    Attends meetings of clubs or organizations: Not on file    Relationship status: Not on file  . Intimate partner violence    Fear of current or ex partner: Not on file    Emotionally abused: Not on file    Physically abused: Not on file    Forced sexual activity: Not on file  Other Topics Concern  . Not on file  Social History Narrative  .  Not on file     Allergies  Allergen Reactions  . Penicillins Hives and Shortness Of Breath    Has patient had a PCN reaction causing immediate rash, facial/tongue/throat swelling, SOB or lightheadedness with hypotension: Yes Has patient had a PCN reaction causing severe rash involving mucus membranes or skin necrosis: No Has patient had a PCN reaction that required hospitalization No Has patient had a PCN reaction occurring within the last 10 years: No If all of the above answers are "NO", then may proceed with Cephalosporin use.  . Azithromycin Hives  . Erythromycin Base Other (See Comments)    Unknown  . Sulfa Antibiotics Hives  . Tramadol Hives     Outpatient Medications Prior to Visit  Medication Sig Dispense Refill  . acetaminophen (TYLENOL) 500 MG tablet Take 1,000 mg by mouth every 6 (six) hours as needed for moderate pain.    Marland Kitchen albuterol (PROVENTIL HFA;VENTOLIN HFA) 108 (90 Base) MCG/ACT inhaler Inhale 1-2 puffs into the lungs every 6 (six) hours as needed for wheezing or shortness of breath. 1 Inhaler 0  . aspirin 81 MG EC tablet Take 1 tablet (81 mg total) by mouth daily. 90 tablet 3  . Blood Glucose Monitoring Suppl  (TRUE METRIX GO GLUCOSE METER) w/Device KIT 1 each by Does not apply route every 8 (eight) hours as needed. 1 kit 0  . cetirizine (ZYRTEC) 10 MG tablet Take 1 tablet (10 mg total) by mouth daily. (Patient taking differently: Take 10 mg by mouth daily as needed for allergies. ) 90 tablet 11  . fluticasone (FLONASE) 50 MCG/ACT nasal spray Place 2 sprays into both nostrils 2 (two) times a day. 16 g 0  . glucose blood test strip Use as instructed 100 each 12  . loratadine (CLARITIN) 10 MG tablet Take by mouth daily as needed for allergies.     . sodium chloride (OCEAN) 0.65 % SOLN nasal spray Place 1 spray into both nostrils every 4 (four) hours as needed for congestion. 1 Bottle 0  . TRUEPLUS LANCETS 26G MISC 1 each by Does not apply route every 8 (eight) hours as needed. 100 each 12  . atorvastatin (LIPITOR) 40 MG tablet Take 1 tablet (40 mg total) by mouth daily at 6 PM. 90 tablet 3  . gabapentin (NEURONTIN) 600 MG tablet Take 1 tablet (600 mg total) by mouth 3 (three) times daily. 90 tablet 3  . ibuprofen (ADVIL) 200 MG tablet Take 800 mg by mouth every 6 (six) hours as needed for moderate pain.    . Insulin Glargine (LANTUS SOLOSTAR) 100 UNIT/ML Solostar Pen Inject 46 Units into the skin 2 (two) times daily. 30 mL 2  . insulin lispro (HUMALOG KWIKPEN) 100 UNIT/ML KiwkPen Inject 10 units total into the skin three times daily before meals. DO not give insulin if blood sugar is <120. (Patient taking differently: Inject 12 Units into the skin 3 (three) times daily. ) 15 mL 11  . lisinopril-hydrochlorothiazide (ZESTORETIC) 20-25 MG tablet Take 1 tablet by mouth daily. 90 tablet 1  . traZODone (DESYREL) 100 MG tablet Take 1 tablet (100 mg total) by mouth at bedtime. 30 tablet 2   No facility-administered medications prior to visit.       Review of Systems Constitutional:   No  weight loss, night sweats,  Fevers, chills, fatigue, lassitude. HEENT:   No headaches,  Difficulty swallowing,  Tooth/dental  problems,  Sore throat,  No sneezing, itching, ear ache, nasal congestion, post nasal drip,   CV:  No chest pain,  Orthopnea, PND, swelling in lower extremities, anasarca, dizziness, palpitations  GI  No heartburn, indigestion, abdominal pain, nausea, vomiting, diarrhea, change in bowel habits, loss of appetite  Resp: No shortness of breath with exertion or at rest.  No excess mucus, no productive cough,  No non-productive cough,  No coughing up of blood.  No change in color of mucus.  No wheezing.  No chest wall deformity  Skin: no rash or lesions.  GU: no dysuria, change in color of urine, no urgency or frequency.  No flank pain.  MS:   joint pain or swelling.  No decreased range of motion.  No back pain.  Psych:  No change in mood or affect. No depression or anxiety.  No memory loss.     Objective:   Physical Exam  Vitals:   07/22/19 0845  BP: (!) 153/92  Pulse: 81  Resp: 16  Temp: 98.5 F (36.9 C)  TempSrc: Oral  SpO2: 99%  Weight: 204 lb (92.5 kg)    Gen: Pleasant, well-nourished, in no distress,  normal affect  ENT: No lesions,  mouth clear,  oropharynx clear, no postnasal drip  Neck: No JVD, no TMG, no carotid bruits  Lungs: No use of accessory muscles, no dullness to percussion, clear without rales or rhonchi  Cardiovascular: RRR, heart sounds normal, no murmur or gallops, no peripheral edema  Abdomen: soft and NT, no HSM,  BS normal  Musculoskeletal: No deformities, no cyanosis or clubbing Over the right hand dorsum there is tenderness in the tendon sheaths of the middle fourth and fifth finger.  There is some swelling in the area close to the wrist.   Neuro: alert, non focal  Skin: Warm, no lesions or rashes    Assessment & Plan:  I personally reviewed all images and lab data in the [facility] system as well as any outside material available during this office visit and agree with the  radiology impressions.   Essential hypertension  Hypertension with plus minus control at this time Note the patient had not taken her medications when she came to the office this morning  For now will continue lisinopril HCT daily  Controlled type 2 diabetes mellitus ([place]) Controlled type 2 diabetes that is now insulin-dependent  CBG is good today but we will need to follow-up her hemoglobin A1c with her lab draws today we will also obtain lipid panel, microalbumin, metabolic panel and complete blood count  All of her insulin was refilled at this visit  Wrist pain, acute, right Acute wrist pain on the right likely from tenosynovitis of the tendon sheaths of the third fourth and fifth finger secondary to diabetes.  Note she has had previous cellulitis and infections in the hand.  This may be a sequelae of that condition.  The patient needs to apply for [place] discount so she can get into the orthopedic clinic.  In the meantime she will maintain Advil 800 mg every 6 hours as needed for moderate pain and use of the wrist splint I also asked her to place ice over the area of pain   Arshiya was seen today for diabetes and hypertension.  Diagnoses and all orders for this visit:  Uncontrolled type 2 diabetes mellitus with hyperglycemia (HCC) -     Glucose (CBG) -     Microalbumin/Creatinine Ratio, Urine -     Hemoglobin A1c -  gabapentin (NEURONTIN) 600 MG tablet; Take 1 tablet (600 mg total) by mouth 3 (three) times daily. -     Cancel: Comprehensive metabolic panel -     CBC with Differential/Platelet; Future -     CBC with Differential/Platelet  Hypopituitarism (HCC) -     Prolactin -     Prolactin  Mixed hyperlipidemia -     Lipid panel -     atorvastatin (LIPITOR) 40 MG tablet; Take 1 tablet (40 mg total) by mouth daily at 6 PM.  Mild renal insufficiency -     CMP14+EGFR  Hypopituitarism (HCC) -     Prolactin -     Prolactin  Essential hypertension -     lisinopril-hydrochlorothiazide (ZESTORETIC) 20-25 MG  tablet; Take 1 tablet by mouth daily.  Insomnia, unspecified type -     traZODone (DESYREL) 100 MG tablet; Take 1 tablet (100 mg total) by mouth at bedtime.  Need for hepatitis C screening test -     Hepatitis C antibody  Wrist pain, acute, right -     Ambulatory referral to Orthopedic Surgery  Controlled type 2 diabetes mellitus with diabetic neuropathy, with long-term current use of insulin (HCC)  Other orders -     ibuprofen (ADVIL) 200 MG tablet; Take 4 tablets (800 mg total) by mouth every 6 (six) hours as needed for moderate pain. -     Insulin Glargine (LANTUS SOLOSTAR) 100 UNIT/ML Solostar Pen; Inject 46 Units into the skin 2 (two) times daily. -     insulin lispro (HUMALOG) 100 UNIT/ML KwikPen; Inject 0.12 mLs (12 Units total) into the skin 3 (three) times daily.  The patient will also receive a hepatitis C screening through lab draws

## 2019-07-22 NOTE — Progress Notes (Signed)
Patient presents for vaccination against influenza per orders of Zelda. Consent given. Counseling provided. No contraindications exists. Vaccine administered without incident.   

## 2019-07-23 ENCOUNTER — Other Ambulatory Visit: Payer: Self-pay | Admitting: Nurse Practitioner

## 2019-07-23 ENCOUNTER — Telehealth: Payer: Self-pay

## 2019-07-23 ENCOUNTER — Encounter: Payer: Self-pay | Admitting: Critical Care Medicine

## 2019-07-23 LAB — PROLACTIN: Prolactin: 14.9 ng/mL (ref 4.8–23.3)

## 2019-07-23 LAB — LIPID PANEL
Chol/HDL Ratio: 3.8 ratio (ref 0.0–4.4)
Cholesterol, Total: 151 mg/dL (ref 100–199)
HDL: 40 mg/dL (ref >39)
LDL Chol Calc (NIH): 85 mg/dL (ref 0–99)
Triglycerides: 151 mg/dL — ABNORMAL HIGH (ref 0–149)
VLDL Cholesterol Cal: 26 mg/dL (ref 5–40)

## 2019-07-23 LAB — CMP14+EGFR
ALT: 12 IU/L (ref 0–32)
AST: 18 IU/L (ref 0–40)
Albumin/Globulin Ratio: 1.4 (ref 1.2–2.2)
Albumin: 4.2 g/dL (ref 3.8–4.9)
Alkaline Phosphatase: 113 IU/L (ref 39–117)
BUN/Creatinine Ratio: 15 (ref 9–23)
BUN: 16 mg/dL (ref 6–24)
Bilirubin Total: 0.4 mg/dL (ref 0.0–1.2)
CO2: 27 mmol/L (ref 20–29)
Calcium: 9.6 mg/dL (ref 8.7–10.2)
Chloride: 104 mmol/L (ref 96–106)
Creatinine, Ser: 1.07 mg/dL — ABNORMAL HIGH (ref 0.57–1.00)
GFR calc Af Amer: 68 mL/min/{1.73_m2} (ref >59)
GFR calc non Af Amer: 59 mL/min/{1.73_m2} — ABNORMAL LOW (ref >59)
Globulin, Total: 3 g/dL (ref 1.5–4.5)
Glucose: 99 mg/dL (ref 65–99)
Potassium: 4.2 mmol/L (ref 3.5–5.2)
Sodium: 144 mmol/L (ref 134–144)
Total Protein: 7.2 g/dL (ref 6.0–8.5)

## 2019-07-23 LAB — HEPATITIS C ANTIBODY: Hep C Virus Ab: 0.1 s/co ratio (ref 0.0–0.9)

## 2019-07-23 LAB — HEMOGLOBIN A1C
Est. average glucose Bld gHb Est-mCnc: 243 mg/dL
Hgb A1c MFr Bld: 10.1 % — ABNORMAL HIGH (ref 4.8–5.6)

## 2019-07-23 LAB — MICROALBUMIN / CREATININE URINE RATIO
Creatinine, Urine: 291.5 mg/dL
Microalb/Creat Ratio: 42 mg/g creat — ABNORMAL HIGH (ref 0–29)
Microalbumin, Urine: 122.9 ug/mL

## 2019-07-23 MED ORDER — BD PEN NEEDLE MINI U/F 31G X 5 MM MISC: 6 refills | Status: DC

## 2019-07-23 MED ORDER — TRULICITY 0.75 MG/0.5ML ~~LOC~~ SOAJ: 0.7500 mg | SUBCUTANEOUS | 0 refills | Status: AC

## 2019-07-23 MED FILL — !TRULICITY 0.75 MG/0.5 ML P: 0.75 | 30 days supply | Qty: 2 | Fill #0

## 2019-07-23 NOTE — Assessment & Plan Note (Addendum)
Controlled type 2 diabetes that is now insulin-dependent  CBG is good today but we will need to follow-up her hemoglobin A1c with her lab draws today we will also obtain lipid panel, microalbumin, metabolic panel and complete blood count  All of her insulin was refilled at this visit

## 2019-07-23 NOTE — Telephone Encounter (Signed)
Contacted pt to go over lab results pt is aware and doesn't have any questions or concerns 

## 2019-07-23 NOTE — Assessment & Plan Note (Signed)
Acute wrist pain on the right likely from tenosynovitis of the tendon sheaths of the third fourth and fifth finger secondary to diabetes.  Note she has had previous cellulitis and infections in the hand.  This may be a sequelae of that condition.  The patient needs to apply for [place] discount so she can get into the orthopedic clinic.  In the meantime she will maintain Advil 800 mg every 6 hours as needed for moderate pain and use of the wrist splint I also asked her to place ice over the area of pain

## 2019-07-23 NOTE — Assessment & Plan Note (Signed)
Hypertension with plus minus control at this time Note the patient had not taken her medications when she came to the office this morning  For now will continue lisinopril HCT daily

## 2019-07-28 LAB — CBC WITH DIFFERENTIAL/PLATELET

## 2019-07-28 LAB — SPECIMEN STATUS REPORT

## 2019-08-19 MED FILL — TRULICITY 0.75 MG/0.5 ML PE: 0.75 | 30 days supply | Qty: 2 | Fill #1

## 2019-08-20 ENCOUNTER — Telehealth: Payer: Self-pay | Admitting: Nurse Practitioner

## 2019-08-20 MED FILL — $LANTUS SOLOSTAR 100 UNITS/: 100 | 19 days supply | Qty: 18 | Fill #1

## 2019-08-20 NOTE — Telephone Encounter (Signed)
CMA called back patient and informed PCP do not prescribed Ambien. Pt. Stated the Trazodone makes her feel drowsy up to the next day. Pt. Would like to know what else she can take for sleep.

## 2019-08-20 NOTE — Telephone Encounter (Signed)
Patient called about the traZODone (DESYREL) 100 MG tablet [id number] patient wants a call from the nurse. She doesn't like how she feels on the medication.

## 2019-08-20 NOTE — Telephone Encounter (Signed)
Pt came in to request a medication change, she states her -traZODone (DESYREL) 100 MG tablet  Is not helping much and she would like to be prescribed  -Ambien  since she has a history of this working for her. Please follow up  -Pharmacy of choice- [facility], [place], [place] [address]. Pt denied an appt for now

## 2019-08-20 NOTE — Telephone Encounter (Signed)
Spoke to patient regarding about her concerns.

## 2019-08-23 NOTE — Telephone Encounter (Signed)
Tylenol pm, melatonin up to 3 tablets nightly  or she can try to take half the trazodone.

## 2019-08-25 MED FILL — !HUMALOG 100 UNITS/ML KWIKP: 100 | 24 days supply | Qty: 9 | Fill #1

## 2019-08-26 ENCOUNTER — Encounter (HOSPITAL_BASED_OUTPATIENT_CLINIC_OR_DEPARTMENT_OTHER): Payer: Self-pay | Admitting: *Deleted

## 2019-08-26 ENCOUNTER — Emergency Department (HOSPITAL_BASED_OUTPATIENT_CLINIC_OR_DEPARTMENT_OTHER)
Admission: EM | Admit: 2019-08-26 | Discharge: 2019-08-26 | Disposition: A | Discharge status: Home / Self Care | Payer: Self-pay | Attending: Emergency Medicine | Admitting: Emergency Medicine

## 2019-08-26 ENCOUNTER — Other Ambulatory Visit: Payer: Self-pay

## 2019-08-26 DIAGNOSIS — N390 Urinary tract infection, site not specified: Secondary | ICD-10-CM | POA: Insufficient documentation

## 2019-08-26 DIAGNOSIS — Z7982 Long term (current) use of aspirin: Secondary | ICD-10-CM | POA: Insufficient documentation

## 2019-08-26 DIAGNOSIS — Z79899 Other long term (current) drug therapy: Secondary | ICD-10-CM | POA: Insufficient documentation

## 2019-08-26 DIAGNOSIS — E119 Type 2 diabetes mellitus without complications: Secondary | ICD-10-CM | POA: Insufficient documentation

## 2019-08-26 DIAGNOSIS — F17210 Nicotine dependence, cigarettes, uncomplicated: Secondary | ICD-10-CM | POA: Insufficient documentation

## 2019-08-26 DIAGNOSIS — J45909 Unspecified asthma, uncomplicated: Secondary | ICD-10-CM | POA: Insufficient documentation

## 2019-08-26 DIAGNOSIS — Z794 Long term (current) use of insulin: Secondary | ICD-10-CM | POA: Insufficient documentation

## 2019-08-26 DIAGNOSIS — I10 Essential (primary) hypertension: Secondary | ICD-10-CM | POA: Insufficient documentation

## 2019-08-26 LAB — URINALYSIS, MICROSCOPIC (REFLEX)
RBC / HPF: >50 RBC/hpf (ref 0–5)
WBC, UA: >50 WBC/hpf (ref 0–5)

## 2019-08-26 LAB — URINALYSIS, ROUTINE W REFLEX MICROSCOPIC
Bilirubin Urine: NEGATIVE
Glucose, UA: NEGATIVE mg/dL
Ketones, ur: NEGATIVE mg/dL
Nitrite: POSITIVE — AB
Protein, ur: 100 mg/dL — AB
Specific Gravity, Urine: 1.025 (ref 1.005–1.030)
pH: 6.5 (ref 5.0–8.0)

## 2019-08-26 MED ORDER — FLUCONAZOLE 150 MG PO TABS: 150.0000 mg | ORAL_TABLET | Freq: Every day | ORAL | 0 refills | Status: AC

## 2019-08-26 MED ORDER — CIPROFLOXACIN HCL 500 MG PO TABS: 500.0000 mg | ORAL_TABLET | Freq: Two times a day (BID) | ORAL | 1 refills | Status: DC

## 2019-08-26 MED FILL — FLUCONAZOLE 150 MG TABLET: 150 | 6 days supply | Qty: 2 | Fill #0

## 2019-08-26 MED FILL — CIPROFLOXACIN HCL 500 MG TA: 500 | 5 days supply | Qty: 10 | Fill #0

## 2019-08-26 NOTE — ED Notes (Signed)
ED Provider at bedside. 

## 2019-08-26 NOTE — ED Provider Notes (Signed)
[facility] EMERGENCY DEPARTMENT Provider Note   CSN: [id number] Arrival date & time: 08/26/19  1257     History Chief Complaint  Patient presents with  . Dysuria    Barbara Wang is a 53 y.o. female with history of hypertension, diabetes who presents with 4-day history of dysuria, frequency, and urgency.  She denies any fever, nausea, vomiting, abdominal pain.  She reports she had a twinge of back pain 3 days ago, however this has not returned.  She has not been taking any medications at home for symptoms.  She notes her blood sugar has been slowly rising over the past few days as well.  She took it prior to arrival it was 159.  She reports the last UTI she had required a longer course of Cipro.  Per chart review, patient was admitted for sepsis presumably from UTI after that time.  Her cultures were negative, however.  She has many allergies.  HPI     Past Medical History:  Diagnosis Date  . Asthma   . Brain tumor ([place])   . Cellulitis 06/2015   rt hand   . Depression   . History of hiatal hernia    "it went away on it's own"  . Hyperlipemia   . Hypertension   . Neuropathy     "feet & hands "  . Obesity   . Pancreatitis   . 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 Wrist pain, acute, right 07/22/2019  . Mixed hyperlipidemia 07/22/2019  . Hypopituitarism ([place]) 02/10/2019  . Asthma 03/16/2017  . Obesity   . Essential hypertension   . Poorly controlled type 2 diabetes mellitus with complication [facility])     Past Surgical History:  Procedure Laterality Date  . ABDOMINAL HYSTERECTOMY    . [place]; 1987  . CHOLECYSTECTOMY  05/11/2012   Procedure: LAPAROSCOPIC CHOLECYSTECTOMY WITH INTRAOPERATIVE CHOLANGIOGRAM;  Surgeon: Gayland Curry, MD,FACS;  Location: [facility];  Service: General;  Laterality: N/A;  . SHOULDER ARTHROSCOPY W/ ROTATOR CUFF REPAIR Right   . TUBAL LIGATION   10/25/1999   Archie Endo 01/11/2011     OB History   No obstetric history on file.     Family History  Problem Relation Age of Onset  . Stroke Mother   . Diabetes Father   . Diabetes Sister     Social History   Tobacco Use  . Smoking status: Current Every Day Smoker    Packs/day: 1.00    Years: 40.00    Pack years: 40.00    Types: Cigarettes  . Smokeless tobacco: Never Used  Substance Use Topics  . Alcohol use: No  . Drug use: No    Home Medications Prior to Admission medications   Medication Sig Start Date End Date Taking? Authorizing Provider  acetaminophen (TYLENOL) 500 MG tablet Take 1,000 mg by mouth every 6 (six) hours as needed for moderate pain.    [provider]  albuterol (PROVENTIL HFA;VENTOLIN HFA) 108 (90 Base) MCG/ACT inhaler Inhale 1-2 puffs into the lungs every 6 (six) hours as needed for wheezing or shortness of breath. 04/03/18   Long, Wonda Olds, MD  aspirin 81 MG EC tablet Take 1 tablet (81 mg total) by mouth daily. 02/15/18   Gildardo Pounds, NP  atorvastatin (LIPITOR) 40 MG tablet Take 1 tablet (40 mg total) by mouth daily at 6 PM.  07/22/19   Elsie Stain, MD  Blood Glucose Monitoring Suppl (TRUE METRIX GO GLUCOSE METER) w/Device KIT 1 each by Does not apply route every 8 (eight) hours as needed. 09/18/16   Maren Reamer, MD  cetirizine (ZYRTEC) 10 MG tablet Take 1 tablet (10 mg total) by mouth daily. Patient taking differently: Take 10 mg by mouth daily as needed for allergies.  02/15/18   Gildardo Pounds, NP  ciprofloxacin (CIPRO) 500 MG tablet Take 1 tablet (500 mg total) by mouth every 12 (twelve) hours. 08/26/19   Aeryn Medici, Bea Graff, PA-C  Dulaglutide (TRULICITY) 1.65 BX/0.3YB SOPN Inject 0.75 mg into the skin once a week. 07/23/19 10/21/19  Gildardo Pounds, NP  fluconazole (DIFLUCAN) 150 MG tablet Take 1 tablet (150 mg total) by mouth daily for 2 doses. Take second dose 72 hours following the first one. 08/26/19 08/28/19  Dimple Bastyr, Bea Graff,  PA-C  fluticasone (FLONASE) 50 MCG/ACT nasal spray Place 2 sprays into both nostrils 2 (two) times a day. 01/02/19   Arrien, Jimmy Picket, MD  gabapentin (NEURONTIN) 600 MG tablet Take 1 tablet (600 mg total) by mouth 3 (three) times daily. 07/22/19   Elsie Stain, MD  glucose blood test strip Use as instructed 05/17/18   Gildardo Pounds, NP  ibuprofen (ADVIL) 200 MG tablet Take 4 tablets (800 mg total) by mouth every 6 (six) hours as needed for moderate pain. 07/22/19   Elsie Stain, MD  Insulin Glargine (LANTUS SOLOSTAR) 100 UNIT/ML Solostar Pen Inject 46 Units into the skin 2 (two) times daily. 07/22/19   Elsie Stain, MD  insulin lispro (HUMALOG) 100 UNIT/ML KwikPen Inject 0.12 mLs (12 Units total) into the skin 3 (three) times daily. 07/22/19   Elsie Stain, MD  Insulin Pen Needle (B-D UF III MINI PEN NEEDLES) 31G X 5 MM MISC Inject into the skin weekly. 07/23/19   Gildardo Pounds, NP  lisinopril-hydrochlorothiazide (ZESTORETIC) 20-25 MG tablet Take 1 tablet by mouth daily. 07/22/19 10/20/19  Elsie Stain, MD  loratadine (CLARITIN) 10 MG tablet Take by mouth daily as needed for allergies.  03/22/14   [provider]  sodium chloride (OCEAN) 0.65 % SOLN nasal spray Place 1 spray into both nostrils every 4 (four) hours as needed for congestion. 01/02/19   Arrien, Jimmy Picket, MD  traZODone (DESYREL) 100 MG tablet Take 1 tablet (100 mg total) by mouth at bedtime. 07/22/19   Elsie Stain, MD  TRUEPLUS LANCETS 26G MISC 1 each by Does not apply route every 8 (eight) hours as needed. 02/15/18   Gildardo Pounds, NP    Allergies    Penicillins, Azithromycin, Erythromycin base, Sulfa antibiotics, and Tramadol  Review of Systems   Review of Systems  Constitutional: Negative for chills and fever.  HENT: Negative for facial swelling and sore throat.   Respiratory: Negative for shortness of breath.   Cardiovascular: Negative for chest pain.  Gastrointestinal:  Negative for abdominal pain, nausea and vomiting.  Genitourinary: Positive for dysuria and frequency. Negative for flank pain and vaginal discharge.  Musculoskeletal: Negative for back pain.  Skin: Negative for rash and wound.  Neurological: Negative for headaches.  Psychiatric/Behavioral: The patient is not nervous/anxious.     Physical Exam Updated Vital Signs BP (!) 143/93   Pulse (!) 109   Temp 98.7 F (37.1 C) (Oral)   Resp 20   Ht _0  (1.626 m)   Wt 88.9 kg   SpO2 99%  BMI 33.64 kg/m   Physical Exam Vitals and nursing note reviewed.  Constitutional:      General: She is not in acute distress.    Appearance: She is well-developed. She is not diaphoretic.  HENT:     Head: Normocephalic and atraumatic.     Mouth/Throat:     Pharynx: No oropharyngeal exudate.  Eyes:     General: No scleral icterus.       Right eye: No discharge.        Left eye: No discharge.     Conjunctiva/sclera: Conjunctivae normal.     Pupils: Pupils are equal, round, and reactive to light.  Neck:     Thyroid: No thyromegaly.  Cardiovascular:     Rate and Rhythm: Regular rhythm.     Heart sounds: Normal heart sounds. No murmur. No friction rub. No gallop.   Pulmonary:     Effort: Pulmonary effort is normal. No respiratory distress.     Breath sounds: Normal breath sounds. No stridor. No wheezing or rales.  Abdominal:     General: Bowel sounds are normal. There is no distension.     Palpations: Abdomen is soft.     Tenderness: There is no abdominal tenderness. There is no right CVA tenderness, left CVA tenderness, guarding or rebound.  Musculoskeletal:     Cervical back: Normal range of motion and neck supple.  Lymphadenopathy:     Cervical: No cervical adenopathy.  Skin:    General: Skin is warm and dry.     Coloration: Skin is not pale.     Findings: No rash.  Neurological:     Mental Status: She is alert.     Coordination: Coordination normal.     ED Results / Procedures /  Treatments   Labs (all labs ordered are listed, but only abnormal results are displayed) Labs Reviewed  URINALYSIS, ROUTINE W REFLEX MICROSCOPIC - Abnormal; Notable for the following components:      Result Value   APPearance CLOUDY (*)    Hgb urine dipstick LARGE (*)    Protein, ur 100 (*)    Nitrite POSITIVE (*)    Leukocytes,Ua MODERATE (*)    All other components within normal limits  URINALYSIS, MICROSCOPIC (REFLEX) - Abnormal; Notable for the following components:   Bacteria, UA MANY (*)    All other components within normal limits  URINE CULTURE    EKG None  Radiology No results found.  Procedures Procedures (including critical care time)  Medications Ordered in ED Medications - No data to display  ED Course  I have reviewed the triage vital signs and the nursing notes.  Pertinent labs & imaging results that were available during my care of the patient were reviewed by me and considered in my medical decision making (see chart for details).    MDM Rules/Calculators/A&P                      Patient presenting with 4-day history of dysuria, frequency.  UA shows large hematuria, positive nitrites, moderate leukocytes, greater than 50 RBCs and WBCs and many bacteria.  Urine culture sent. Patient stating that she needs a longer course, she did not get better after 1 week of Cipro previously.  Patient was admitted for sepsis possibly due to UTI and agree negative cultures after 1 week of antibiotics.  I discussed the case with pharmacist, Hank, who advised course of Cipro for 5 days and give refill if symptoms not resolved.  Close follow-up with PCP discussed.  Low suspicion of pyelonephritis at this time.  Patient also requesting Diflucan, will give prophylactic prescription.  Return precautions discussed.  Patient understands and agrees with plan.  Patient vitals stable throughout ED course and discharged in satisfactory condition.  Final Clinical Impression(s) / ED  Diagnoses Final diagnoses:  Lower urinary tract infectious disease    Rx / DC Orders ED Discharge Orders         Ordered    ciprofloxacin (CIPRO) 500 MG tablet  Every 12 hours     08/26/19 1423    fluconazole (DIFLUCAN) 150 MG tablet  Daily     08/26/19 [address], [place] 08/26/19 1517    Tegeler, Gwenyth Allegra, MD 08/26/19 1530

## 2019-08-26 NOTE — ED Triage Notes (Signed)
Dysuria x 4 days 

## 2019-08-26 NOTE — ED Notes (Signed)
Only has pain during urination, "burning sensation"

## 2019-08-26 NOTE — ED Notes (Signed)
DC instructions reviewed with pt, discussed Rx written by EDP.

## 2019-08-26 NOTE — Discharge Instructions (Addendum)
Take Cipro as prescribed for 5 days.  If your symptoms are resolved by this time, you do not need to get the refill.  If they are not, please refill and complete course.  Cipro is a powerful medication with several possible adverse reactions and a longer course increases this risk and may not be necessary.  If you develop a yeast infection, take 1 Diflucan and repeat in 72 hours.  You will be called if there needs to be a change in your antibiotic based on your urine culture.  Please return to the emergency department if you develop any new or worsening symptoms.

## 2019-08-26 NOTE — ED Notes (Signed)
Spoke with lab staff to inform of current urine culture order

## 2019-08-26 NOTE — ED Notes (Signed)
UA resulted with labs, awaiting EDP assessement and orders

## 2019-08-26 NOTE — ED Notes (Signed)
States she is here for evaluation of a UTI.

## 2019-08-28 LAB — URINE CULTURE: Culture: >=100000 — AB

## 2019-08-28 NOTE — Telephone Encounter (Signed)
Spoke to patient and informed on PCP advising. Pt. Understood.  

## 2019-09-01 ENCOUNTER — Telehealth (HOSPITAL_COMMUNITY): Payer: Self-pay

## 2019-09-01 MED FILL — LINEZOLID 600 MG TABS: 600 | 7 days supply | Qty: 14 | Fill #0

## 2019-09-01 NOTE — Telephone Encounter (Signed)
Pt. Called this Rn and needed to speak to the RN that called her concerning her Urine Culture and Treatment.  I explained to her that I do not handle urine cultures and gave her the Phone number to our Patient Placement Office [phone] and to speak to the RN for assistance. Pt. Very thankful and verbalized understanding.

## 2019-09-23 MED FILL — !LANTUS SOLOSTAR 100UNITS/M: 100 | 16 days supply | Qty: 15 | Fill #2

## 2019-09-23 MED FILL — !HUMALOG 100 UNITS/ML KWIKP: 100 | 16 days supply | Qty: 6 | Fill #2

## 2019-09-23 MED FILL — traZODone HCL 100 MG TABS: 100 | 30 days supply | Qty: 30 | Fill #1

## 2019-09-23 MED FILL — TRULICITY 0.75 MG/0.5 ML PE: 0.75 | 30 days supply | Qty: 2 | Fill #2

## 2019-09-23 MED FILL — LISINOPRIL-HCTZ 20-25 MG TA: 20-25 | 30 days supply | Qty: 30 | Fill #1

## 2019-11-17 MED FILL — traZODone HCL 100 MG TABS: 100 | 30 days supply | Qty: 30 | Fill #2

## 2019-11-17 MED FILL — LISINOPRIL-HCTZ 20-25 MG TA: 20-25 | 30 days supply | Qty: 30 | Fill #2

## 2019-11-17 MED FILL — $HUMALOG 100 UNITS/ML KWIKP: 100 | 16 days supply | Qty: 6 | Fill #3

## 2019-11-17 MED FILL — $LANTUS SOLOSTAR 100 UNITS/: 100 | 16 days supply | Qty: 15 | Fill #3

## 2019-11-30 ENCOUNTER — Encounter (HOSPITAL_BASED_OUTPATIENT_CLINIC_OR_DEPARTMENT_OTHER): Payer: Self-pay | Admitting: Emergency Medicine

## 2019-11-30 ENCOUNTER — Emergency Department (HOSPITAL_BASED_OUTPATIENT_CLINIC_OR_DEPARTMENT_OTHER)
Admission: EM | Admit: 2019-11-30 | Discharge: 2019-11-30 | Disposition: A | Discharge status: Home / Self Care | Payer: Self-pay | Attending: Emergency Medicine | Admitting: Emergency Medicine

## 2019-11-30 ENCOUNTER — Other Ambulatory Visit: Payer: Self-pay

## 2019-11-30 ENCOUNTER — Emergency Department (HOSPITAL_BASED_OUTPATIENT_CLINIC_OR_DEPARTMENT_OTHER): Payer: Self-pay

## 2019-11-30 DIAGNOSIS — Z794 Long term (current) use of insulin: Secondary | ICD-10-CM | POA: Insufficient documentation

## 2019-11-30 DIAGNOSIS — Z7982 Long term (current) use of aspirin: Secondary | ICD-10-CM | POA: Insufficient documentation

## 2019-11-30 DIAGNOSIS — Z79899 Other long term (current) drug therapy: Secondary | ICD-10-CM | POA: Insufficient documentation

## 2019-11-30 DIAGNOSIS — F17210 Nicotine dependence, cigarettes, uncomplicated: Secondary | ICD-10-CM | POA: Insufficient documentation

## 2019-11-30 DIAGNOSIS — I10 Essential (primary) hypertension: Secondary | ICD-10-CM | POA: Insufficient documentation

## 2019-11-30 DIAGNOSIS — J45909 Unspecified asthma, uncomplicated: Secondary | ICD-10-CM | POA: Insufficient documentation

## 2019-11-30 DIAGNOSIS — N3001 Acute cystitis with hematuria: Secondary | ICD-10-CM | POA: Insufficient documentation

## 2019-11-30 DIAGNOSIS — R109 Unspecified abdominal pain: Secondary | ICD-10-CM

## 2019-11-30 DIAGNOSIS — Z9049 Acquired absence of other specified parts of digestive tract: Secondary | ICD-10-CM | POA: Insufficient documentation

## 2019-11-30 DIAGNOSIS — E119 Type 2 diabetes mellitus without complications: Secondary | ICD-10-CM | POA: Insufficient documentation

## 2019-11-30 LAB — URINALYSIS, ROUTINE W REFLEX MICROSCOPIC
Bilirubin Urine: NEGATIVE
Glucose, UA: >=500 mg/dL — AB
Ketones, ur: NEGATIVE mg/dL
Nitrite: NEGATIVE
Protein, ur: 30 mg/dL — AB
Specific Gravity, Urine: 1.025 (ref 1.005–1.030)
pH: 6 (ref 5.0–8.0)

## 2019-11-30 LAB — BASIC METABOLIC PANEL
Anion gap: 9 (ref 5–15)
BUN: 17 mg/dL (ref 6–20)
CO2: 28 mmol/L (ref 22–32)
Calcium: 9.1 mg/dL (ref 8.9–10.3)
Chloride: 104 mmol/L (ref 98–111)
Creatinine, Ser: 0.99 mg/dL (ref 0.44–1.00)
GFR calc Af Amer: >60 mL/min (ref >60)
GFR calc non Af Amer: >60 mL/min (ref >60)
Glucose, Bld: 255 mg/dL — ABNORMAL HIGH (ref 70–99)
Potassium: 4 mmol/L (ref 3.5–5.1)
Sodium: 141 mmol/L (ref 135–145)

## 2019-11-30 LAB — CBC WITH DIFFERENTIAL/PLATELET
Abs Immature Granulocytes: 0.01 10*3/uL (ref 0.00–0.07)
Basophils Absolute: 0.1 10*3/uL (ref 0.0–0.1)
Basophils Relative: 1 %
Eosinophils Absolute: 0.2 10*3/uL (ref 0.0–0.5)
Eosinophils Relative: 3 %
HCT: 42.4 % (ref 36.0–46.0)
Hemoglobin: 14 g/dL (ref 12.0–15.0)
Immature Granulocytes: 0 %
Lymphocytes Relative: 34 %
Lymphs Abs: 2.2 10*3/uL (ref 0.7–4.0)
MCH: 29.7 pg (ref 26.0–34.0)
MCHC: 33 g/dL (ref 30.0–36.0)
MCV: 89.8 fL (ref 80.0–100.0)
Monocytes Absolute: 0.5 10*3/uL (ref 0.1–1.0)
Monocytes Relative: 8 %
Neutro Abs: 3.4 10*3/uL (ref 1.7–7.7)
Neutrophils Relative %: 54 %
Platelets: 371 10*3/uL (ref 150–400)
RBC: 4.72 MIL/uL (ref 3.87–5.11)
RDW: 12.8 % (ref 11.5–15.5)
WBC: 6.4 10*3/uL (ref 4.0–10.5)
nRBC: 0 % (ref 0.0–0.2)

## 2019-11-30 LAB — URINALYSIS, MICROSCOPIC (REFLEX): WBC, UA: >50 WBC/hpf (ref 0–5)

## 2019-11-30 MED ORDER — IBUPROFEN 800 MG PO TABS
800.0000 mg | ORAL_TABLET | Freq: Once | ORAL | Status: AC
  Administered 2019-11-30: 800 mg via ORAL
  Filled 2019-11-30: qty 1

## 2019-11-30 MED ORDER — CIPROFLOXACIN HCL 500 MG PO TABS: 500.0000 mg | ORAL_TABLET | Freq: Two times a day (BID) | ORAL | 0 refills | Status: DC

## 2019-11-30 MED ORDER — CIPROFLOXACIN HCL 500 MG PO TABS: 500.0000 mg | ORAL_TABLET | Freq: Two times a day (BID) | ORAL | 0 refills | Status: AC

## 2019-11-30 NOTE — ED Notes (Signed)
Patient transported to CT 

## 2019-11-30 NOTE — Discharge Instructions (Signed)
As discussed, your CT scan showed no kidney stones.  Your urine showed a possible urinary tract infection.  I am sending you home with an antibiotic.  Take as prescribed.  Your CT scan showed a little bit of air in your urine.  I have included the number for urology and your discharge papers.  Please call urology tomorrow to schedule an appointment for further evaluation.  You may take over-the-counter ibuprofen or Tylenol as needed for pain.  Please follow-up with PCP within the next week if symptoms do not improve.  Return to the ER for new or worsening symptoms.

## 2019-11-30 NOTE — ED Notes (Signed)
ED Provider at bedside. 

## 2019-11-30 NOTE — ED Triage Notes (Signed)
Pt states that she is having right side pain, she reports that it starts in her right mid abdominal area and radiates to her right flank for about a week. She had a MRSA UTI a couple of months ago. She also states that her " coochie is farting" - the patient has multiple complaints

## 2019-11-30 NOTE — ED Provider Notes (Signed)
[facility]PART[facility] Provider Note   CSN: [id number] Arrival date & time: 11/30/19  1132     History Chief Complaint  Patient presents with  . Flank Pain    Barbara Wang is a 54 y.o. female with a past medical history significant for type 2 diabetes, history of pancreatitis, hypertension, hyperlipidemia, asthma, history of brain tumor, and depression who presents to the ED due to right flank pain that radiates to the right side of her back x1 week.  Patient describes pain as a constant "nagging" sensation that is sometimes sharp in nature.  Admits to dysuria a few days ago that has completely resolved.  She attributed her pain to a UTI and has been taking over-the-counter UTI medication and drinking cranberry juice with no relief.  Denies abdominal pain, nausea, vomiting, and diarrhea.  Denies fever and chills.  She is currently sexually active, but denies concerns for STDs.  Denies vaginal symptoms.  She also admits to malodorous urine.  Denies hematuria.  History obtained from patient and past medical records. No interpreter used during encounter.      Past Medical History:  Diagnosis Date  . Asthma   . Brain tumor ([place])   . Cellulitis 06/2015   rt hand   . Depression   . History of hiatal hernia    "it went away on it's own"  . Hyperlipemia   . Hypertension   . Neuropathy     "feet & hands "  . Obesity   . Pancreatitis   . 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 Wrist pain, acute, right 07/22/2019  . Mixed hyperlipidemia 07/22/2019  . 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 Asthma 03/16/2017  . Obesity   . Essential hypertension   . Poorly controlled type 2 diabetes mellitus with complication [facility])     Past Surgical History:  Procedure Laterality Date  . ABDOMINAL HYSTERECTOMY    . [place]; 1987  . CHOLECYSTECTOMY  05/11/2012   Procedure:  LAPAROSCOPIC CHOLECYSTECTOMY WITH INTRAOPERATIVE CHOLANGIOGRAM;  Surgeon: Gayland Curry, MD,FACS;  Location: [facility];  Service: General;  Laterality: N/A;  . SHOULDER ARTHROSCOPY W/ ROTATOR CUFF REPAIR Right   . TUBAL LIGATION  10/25/1999   Archie Endo 01/11/2011     OB History   No obstetric history on file.     Family History  Problem Relation Age of Onset  . Stroke Mother   . Diabetes Father   . Diabetes Sister     Social History   Tobacco Use  . Smoking status: Current Every Day Smoker    Packs/day: 1.00    Years: 40.00    Pack years: 40.00    Types: Cigarettes  . Smokeless tobacco: Never Used  Substance Use Topics  . Alcohol use: No  . Drug use: No    Home Medications Prior to Admission medications   Medication Sig Start Date End Date Taking? Authorizing Provider  acetaminophen (TYLENOL) 500 MG tablet Take 1,000 mg by mouth every 6 (six) hours as needed for moderate pain.    [provider]  albuterol (PROVENTIL HFA;VENTOLIN HFA) 108 (90 Base) MCG/ACT inhaler Inhale 1-2 puffs into the lungs every 6 (six) hours as needed for wheezing or shortness of breath. 04/03/18   Long, Wonda Olds, MD  aspirin 81 MG EC tablet Take 1 tablet (81 mg total) by mouth daily. 02/15/18  Gildardo Pounds, NP  atorvastatin (LIPITOR) 40 MG tablet Take 1 tablet (40 mg total) by mouth daily at 6 PM. 07/22/19   Elsie Stain, MD  Blood Glucose Monitoring Suppl (TRUE METRIX GO GLUCOSE METER) w/Device KIT 1 each by Does not apply route every 8 (eight) hours as needed. 09/18/16   Maren Reamer, MD  cetirizine (ZYRTEC) 10 MG tablet Take 1 tablet (10 mg total) by mouth daily. Patient taking differently: Take 10 mg by mouth daily as needed for allergies.  02/15/18   Gildardo Pounds, NP  ciprofloxacin (CIPRO) 500 MG tablet Take 1 tablet (500 mg total) by mouth every 12 (twelve) hours. 08/26/19   Law, Bea Graff, PA-C  ciprofloxacin (CIPRO) 500 MG tablet Take 1 tablet (500 mg total) by mouth every 12  (twelve) hours for 7 days. 11/30/19 12/07/19  Suzy Bouchard, PA-C  fluticasone (FLONASE) 50 MCG/ACT nasal spray Place 2 sprays into both nostrils 2 (two) times a day. 01/02/19   Arrien, Jimmy Picket, MD  gabapentin (NEURONTIN) 600 MG tablet Take 1 tablet (600 mg total) by mouth 3 (three) times daily. 07/22/19   Elsie Stain, MD  glucose blood test strip Use as instructed 05/17/18   Gildardo Pounds, NP  ibuprofen (ADVIL) 200 MG tablet Take 4 tablets (800 mg total) by mouth every 6 (six) hours as needed for moderate pain. 07/22/19   Elsie Stain, MD  Insulin Glargine (LANTUS SOLOSTAR) 100 UNIT/ML Solostar Pen Inject 46 Units into the skin 2 (two) times daily. 07/22/19   Elsie Stain, MD  insulin lispro (HUMALOG) 100 UNIT/ML KwikPen Inject 0.12 mLs (12 Units total) into the skin 3 (three) times daily. 07/22/19   Elsie Stain, MD  Insulin Pen Needle (B-D UF III MINI PEN NEEDLES) 31G X 5 MM MISC Inject into the skin weekly. 07/23/19   Gildardo Pounds, NP  lisinopril-hydrochlorothiazide (ZESTORETIC) 20-25 MG tablet Take 1 tablet by mouth daily. 07/22/19 10/20/19  Elsie Stain, MD  loratadine (CLARITIN) 10 MG tablet Take by mouth daily as needed for allergies.  03/22/14   [provider]  sodium chloride (OCEAN) 0.65 % SOLN nasal spray Place 1 spray into both nostrils every 4 (four) hours as needed for congestion. 01/02/19   Arrien, Jimmy Picket, MD  traZODone (DESYREL) 100 MG tablet Take 1 tablet (100 mg total) by mouth at bedtime. 07/22/19   Elsie Stain, MD  TRUEPLUS LANCETS 26G MISC 1 each by Does not apply route every 8 (eight) hours as needed. 02/15/18   Gildardo Pounds, NP    Allergies    Penicillins, Azithromycin, Erythromycin base, Sulfa antibiotics, and Tramadol  Review of Systems   Review of Systems  Constitutional: Negative for chills and fever.  Gastrointestinal: Negative for abdominal pain, diarrhea, nausea and vomiting.  Genitourinary: Positive  for dysuria and flank pain. Negative for difficulty urinating, pelvic pain, vaginal bleeding, vaginal discharge and vaginal pain.  Musculoskeletal: Positive for back pain.  All other systems reviewed and are negative.   Physical Exam Updated Vital Signs BP 130/73 (BP Location: Right Arm)   Pulse 76   Temp 98.9 F (37.2 C) (Oral)   Resp 16   Ht _0  (1.626 m)   Wt 90.2 kg   SpO2 99%   BMI 34.14 kg/m   Physical Exam Vitals and nursing note reviewed.  Constitutional:      General: She is not in acute distress.    Appearance: She is  not ill-appearing.  HENT:     Head: Normocephalic.  Eyes:     Pupils: Pupils are equal, round, and reactive to light.  Cardiovascular:     Rate and Rhythm: Normal rate and regular rhythm.     Pulses: Normal pulses.     Heart sounds: Normal heart sounds. No murmur. No friction rub. No gallop.   Pulmonary:     Effort: Pulmonary effort is normal.     Breath sounds: Normal breath sounds.  Abdominal:     General: Abdomen is flat. Bowel sounds are normal. There is no distension.     Palpations: Abdomen is soft.     Tenderness: There is no abdominal tenderness. There is right CVA tenderness. There is no guarding or rebound.     Comments: Abdomen soft, nondistended, nontender to palpation in all quadrants without guarding or peritoneal signs. No rebound.   Musculoskeletal:     Cervical back: Neck supple.     Comments: Able to move all 4 extremities without difficulty.   Skin:    General: Skin is warm and dry.     Findings: No rash.     Comments: No overlying rash in right flank region.  Neurological:     General: No focal deficit present.     Mental Status: She is alert.  Psychiatric:        Mood and Affect: Mood normal.        Behavior: Behavior normal.     ED Results / Procedures / Treatments   Labs (all labs ordered are listed, but only abnormal results are displayed) Labs Reviewed  URINALYSIS, ROUTINE W REFLEX MICROSCOPIC - Abnormal;  Notable for the following components:      Result Value   APPearance CLOUDY (*)    Glucose, UA >=500 (*)    Hgb urine dipstick TRACE (*)    Protein, ur 30 (*)    Leukocytes,Ua TRACE (*)    All other components within normal limits  BASIC METABOLIC PANEL - Abnormal; Notable for the following components:   Glucose, Bld 255 (*)    All other components within normal limits  URINALYSIS, MICROSCOPIC (REFLEX) - Abnormal; Notable for the following components:   Bacteria, UA MANY (*)    All other components within normal limits  URINE CULTURE  CBC WITH DIFFERENTIAL/PLATELET    EKG None  Radiology CT Renal Stone Study  Result Date: 11/30/2019 CLINICAL DATA:  Right flank pain, urinary urgency, kidney stones suspected EXAM: CT ABDOMEN AND PELVIS WITHOUT CONTRAST TECHNIQUE: Multidetector CT imaging of the abdomen and pelvis was performed following the standard protocol without IV contrast. COMPARISON:  01/21/2016 FINDINGS: Lower chest: No acute abnormality. Hepatobiliary: No focal liver abnormality is seen. Status post cholecystectomy. No biliary dilatation. Pancreas: Unremarkable. No pancreatic ductal dilatation or surrounding inflammatory changes. Spleen: Normal in size without significant abnormality. Adrenals/Urinary Tract: Adrenal glands are unremarkable. Kidneys are normal, without renal calculi, solid lesion, or hydronephrosis. Air in the urinary bladder. Stomach/Bowel: Stomach is within normal limits. Appendix appears normal. No evidence of bowel wall thickening, distention, or inflammatory changes. Sigmoid diverticulosis. Vascular/Lymphatic: Aortic atherosclerosis. Numerous, nonspecific prominent abdominopelvic lymph nodes, unchanged compared to prior examination. Reproductive: Status post hysterectomy. Other: No abdominal wall hernia or abnormality. No abdominopelvic ascites. Musculoskeletal: No acute or significant osseous findings. IMPRESSION: 1. No acute noncontrast CT findings to explain  right-sided flank pain. No evidence of urinary tract calculus or hydronephrosis. 2.  Air in the urinary bladder.  Correlate for catheterization. 3.  Aortic  Atherosclerosis (ICD10-I70.0). Electronically Signed   By: Eddie Candle M.D.   On: 11/30/2019 13:25    Procedures Procedures (including critical care time)  Medications Ordered in ED Medications  ibuprofen (ADVIL) tablet 800 mg (800 mg Oral Given 11/30/19 1304)    ED Course  I have reviewed the triage vital signs and the nursing notes.  Pertinent labs & imaging results that were available during my care of the patient were reviewed by me and considered in my medical decision making (see chart for details).  Clinical Course as of Nov 30 1430  Sun Nov 30, 2019  1229 Leukocytes,Ua(!): TRACE [CA]  1426 Bacteria, UA(!): MANY [CA]    Clinical Course User Index [CA] Suzy Bouchard, PA-C   MDM Rules/Calculators/A&P                     54 year old female presents to the ED due to right flank pain that radiates to her right low back x1 week.  Patient states she had dysuria few days ago that has completely resolved.  Stable vitals.  Patient no acute distress and non-ill-appearing.  Physical exam reassuring.  Positive CVA tenderness on the right.  No thoracic or lumbar midline tenderness.  No overlying rash to suggest shingles.  Will obtain routine labs and UA to rule out infection.  Will also obtain CT renal study to rule out kidney stone.  BMP significant for hyperglycemia to 255 with no anion gap.  No concern for DKA at this time.  Normal renal function.  CBC unremarkable with no leukocytosis.  UA significant for trace leukocytes and many bacteria.  Urine culture pending.  Will treat for with Ciprofloxacin for 7 days to cover for pyelonephritis. Patient has SOB to PCNs. CT renal study personally reviewed which demonstrates: IMPRESSION:  1. No acute noncontrast CT findings to explain right-sided flank  pain. No evidence of urinary tract  calculus or hydronephrosis.    2. Air in the urinary bladder. Correlate for catheterization.    3. Aortic Atherosclerosis (ICD10-I70.0).   Discussed with radiology about air in urinary bladder.  Unable to visualize possible fistula.  Will discharge patient with urology follow-up for further evaluation of possible fistula.  Instructed patient to follow-up with PCP if symptoms not improved within the next week.  Advised patient take over-the-counter ibuprofen or Tylenol as needed for pain. Strict ED precautions discussed with patient. Patient states understanding and agrees to plan. Patient discharged home in no acute distress and stable vitals.  Final Clinical Impression(s) / ED Diagnoses Final diagnoses:  Flank pain  Acute cystitis with hematuria    Rx / DC Orders ED Discharge Orders         Ordered    ciprofloxacin (CIPRO) 500 MG tablet  Every 12 hours,   Status:  Discontinued     11/30/19 1343    ciprofloxacin (CIPRO) 500 MG tablet  Every 12 hours     11/30/19 1431           Suzy Bouchard, PA-C 11/30/19 1436    Malvin Johns, MD 11/30/19 1436

## 2019-12-02 LAB — URINE CULTURE: Culture: >=100000 — AB

## 2019-12-03 ENCOUNTER — Telehealth: Payer: Self-pay | Admitting: *Deleted

## 2019-12-03 NOTE — Telephone Encounter (Signed)
Post ED Visit - Positive Culture Follow-up  Culture report reviewed by antimicrobial stewardship pharmacist: [facility] Team []  Elenor Quinones, Pharm.D. []  Heide Guile, Pharm.D., BCPS AQ-ID []  Parks Neptune, Pharm.D., BCPS []  Alycia Rossetti, Pharm.D., BCPS []  [place], Pharm.D., BCPS, AAHIVP []  Legrand Como, Pharm.D., BCPS, AAHIVP []  Salome Arnt, PharmD, BCPS []  Johnnette Gourd, PharmD, BCPS []  Hughes Better, PharmD, BCPS []  Leeroy Cha, PharmD []  Laqueta Linden, PharmD, BCPS []  Albertina Parr, PharmD Johnnye Lana, [facility] Team []  Leodis Sias, PharmD []  Lindell Spar, PharmD []  Royetta Asal, PharmD []  Graylin Shiver, Rph []  Rema Fendt) Glennon Mac, PharmD []  Arlyn Dunning, PharmD []  Netta Cedars, PharmD []  Dia Sitter, PharmD []  Leone Haven, PharmD []  Gretta Arab, PharmD []  Theodis Shove, PharmD []  Peggyann Juba, PharmD []  Reuel Boom, PharmD   Positive urine culture Treated with Ciprofloxacin, organism sensitive to the same and no further patient follow-up is required at this time.  Harlon Flor [facility] 12/03/2019, 10:46 AM

## 2019-12-09 MED FILL — $LANTUS SOLOSTAR 100 UNITS/: 100 | 32 days supply | Qty: 30 | Fill #1

## 2019-12-11 ENCOUNTER — Ambulatory Visit: Payer: Self-pay | Admitting: Nurse Practitioner

## 2019-12-12 ENCOUNTER — Ambulatory Visit: Payer: Self-pay | Attending: Nurse Practitioner | Admitting: Nurse Practitioner

## 2019-12-12 ENCOUNTER — Other Ambulatory Visit: Payer: Self-pay | Admitting: Nurse Practitioner

## 2019-12-12 ENCOUNTER — Encounter: Payer: Self-pay | Admitting: Nurse Practitioner

## 2019-12-12 ENCOUNTER — Other Ambulatory Visit: Payer: Self-pay

## 2019-12-12 DIAGNOSIS — E1165 Type 2 diabetes mellitus with hyperglycemia: Secondary | ICD-10-CM

## 2019-12-12 DIAGNOSIS — Z1211 Encounter for screening for malignant neoplasm of colon: Secondary | ICD-10-CM

## 2019-12-12 DIAGNOSIS — G4700 Insomnia, unspecified: Secondary | ICD-10-CM

## 2019-12-12 DIAGNOSIS — E782 Mixed hyperlipidemia: Secondary | ICD-10-CM

## 2019-12-12 DIAGNOSIS — Z130 Encounter for screening for diseases of the blood and blood-forming organs and certain disorders involving the immune mechanism: Secondary | ICD-10-CM

## 2019-12-12 DIAGNOSIS — I10 Essential (primary) hypertension: Secondary | ICD-10-CM

## 2019-12-12 DIAGNOSIS — J301 Allergic rhinitis due to pollen: Secondary | ICD-10-CM

## 2019-12-12 DIAGNOSIS — J4530 Mild persistent asthma, uncomplicated: Secondary | ICD-10-CM

## 2019-12-12 MED ORDER — TRUEPLUS LANCETS 26G MISC: 1.0000 | Freq: Three times a day (TID) | 12 refills | Status: DC | PRN

## 2019-12-12 MED ORDER — ASPIRIN 81 MG PO TBEC: 81.0000 mg | DELAYED_RELEASE_TABLET | Freq: Every day | ORAL | 3 refills | Status: DC

## 2019-12-12 MED ORDER — GABAPENTIN 600 MG PO TABS: 600.0000 mg | ORAL_TABLET | Freq: Three times a day (TID) | ORAL | 3 refills | Status: DC

## 2019-12-12 MED ORDER — BD PEN NEEDLE MINI U/F 31G X 5 MM MISC: 6 refills | Status: DC

## 2019-12-12 MED ORDER — QUETIAPINE FUMARATE 50 MG PO TABS: 50.0000 mg | ORAL_TABLET | Freq: Every day | ORAL | 3 refills | Status: DC

## 2019-12-12 MED ORDER — GLUCOSE BLOOD VI STRP: ORAL_STRIP | 12 refills | Status: DC

## 2019-12-12 MED ORDER — FLUTICASONE PROPIONATE 50 MCG/ACT NA SUSP: 2.0000 | Freq: Every day | NASAL | 1 refills | Status: DC

## 2019-12-12 MED ORDER — PROAIR HFA 108 (90 BASE) MCG/ACT IN AERS: 1.0000 | INHALATION_SPRAY | Freq: Four times a day (QID) | RESPIRATORY_TRACT | 1 refills | Status: DC | PRN

## 2019-12-12 MED ORDER — LANTUS SOLOSTAR 100 UNIT/ML ~~LOC~~ SOPN: 46.0000 [IU] | PEN_INJECTOR | Freq: Two times a day (BID) | SUBCUTANEOUS | 2 refills | Status: DC

## 2019-12-12 MED ORDER — ATORVASTATIN CALCIUM 40 MG PO TABS: 40.0000 mg | ORAL_TABLET | Freq: Every day | ORAL | 3 refills | Status: DC

## 2019-12-12 MED ORDER — NOVOLOG FLEXPEN 100 UNIT/ML ~~LOC~~ SOPN: 12.0000 [IU] | PEN_INJECTOR | Freq: Three times a day (TID) | SUBCUTANEOUS | 3 refills | Status: DC

## 2019-12-12 MED ORDER — LISINOPRIL-HYDROCHLOROTHIAZIDE 20-25 MG PO TABS: 1.0000 | ORAL_TABLET | Freq: Every day | ORAL | 1 refills | Status: DC

## 2019-12-12 MED FILL — TRUEplus LANCETS 28G MISC: 33 days supply | Qty: 100 | Fill #0

## 2019-12-12 MED FILL — GABAPENTIN 600 MG TABLET: 600 | 30 days supply | Qty: 90 | Fill #0

## 2019-12-12 MED FILL — FLUTICASONE PROP 50 MCG SPR: 50 | 30 days supply | Qty: 16 | Fill #0

## 2019-12-12 MED FILL — ATORVASTATIN CALCIUM 40 MG: 40 | 30 days supply | Qty: 30 | Fill #0

## 2019-12-12 MED FILL — QUETIAPINE FUMARATE 50 MG T: 50 | 30 days supply | Qty: 60 | Fill #0

## 2019-12-12 MED FILL — ALBUTEROL SULFATE HFA 108 (: 108 (90 BAS | 28 days supply | Qty: 153 | Fill #0

## 2019-12-12 MED FILL — TRUE METRIX TEST STRIP: 25 days supply | Qty: 100 | Fill #0

## 2019-12-12 MED FILL — LISINOPRIL-HCTZ 20-25 MG TA: 20-25 | 30 days supply | Qty: 30 | Fill #0

## 2019-12-12 NOTE — Progress Notes (Signed)
Would like another inhaler due to the current one not working.  Trazadone medication not working.

## 2019-12-12 NOTE — Progress Notes (Signed)
Virtual Visit via Telephone Note Due to national recommendations of social distancing due to [place] 19, telehealth visit is felt to be most appropriate for this patient at this time.  I discussed the limitations, risks, security and privacy concerns of performing an evaluation and management service by telephone and the availability of in person appointments. I also discussed with the patient that there may be a patient responsible charge related to this service. The patient expressed understanding and agreed to proceed.    I connected with Barbara Wang on 12/12/19  at   2:30 PM EDT  EDT by telephone and verified that I am speaking with the correct person using two identifiers.   Consent I discussed the limitations, risks, security and privacy concerns of performing an evaluation and management service by telephone and the availability of in person appointments. I also discussed with the patient that there may be a patient responsible charge related to this service. The patient expressed understanding and agreed to proceed.   Location of Patient: Private Residence   Location of Provider: [place] and [employer] Office    Persons participating in Telemedicine visit: Barbara Rankins FNP-BC [place]    History of Present Illness: Telemedicine visit for: F/U  Asthma Symptoms exacerbated by seasonal allergies.  She states proair is more effective in relieving her symptoms than Ventolin. Would like to switch. She continues to smoke but is trying to quit.   DM TYPE 2 Fasting blood sugar 200 this morning. Not consistently diet adherent. Overdue for eye exam.  Endorses adherence taking Humalog 12 units 3 times daily which was switched to NovoLog today due to insurance coverage, Lantus 46 units twice daily.  She takes gabapentin for hyperglycemic symptoms of peripheral neuropathy.  Currently on statin and ACE.  LDL is currently not at goal of less than  70. Lab Results  Component Value Date   HGBA1C 10.1 (H) 07/22/2019    Essential Hypertension She monitors her blood pressure at home. Most readings average 130/70s.  She endorses medication adherence taking lisinopril-hydrochlorothiazide 20-25 mg daily. Denies chest pain, shortness of breath, palpitations, lightheadedness, dizziness, headaches or BLE edema.  BP Readings from Last 3 Encounters:  11/30/19 130/73  08/26/19 (!) 143/93  07/22/19 (!) 153/92    Insomnia Trazadone 100 mg medication not effective.  She is requesting Ambien again however I have instructed her that I do not prescribe Ambien.  We will try Seroquel to see if this helps with her insomnia.   Dyslipidemia LDL not at goal however she does endorse medication compliance taking atorvastatin 40 mg daily.  She denies any statin intolerance.  She is not diet or exercise adherent. Lab Results  Component Value Date   Olga 85 07/22/2019     Past Medical History:  Diagnosis Date  . Asthma   . Brain tumor ([place])   . Cellulitis 06/2015   rt hand   . Depression   . History of hiatal hernia    "it went away on it's own"  . Hyperlipemia   . Hypertension   . Neuropathy     "feet & hands "  . Obesity   . Pancreatitis   . Type II diabetes mellitus (HCC)    insulin dependent     Past Surgical History:  Procedure Laterality Date  . ABDOMINAL HYSTERECTOMY    . [place]; 1987  . CHOLECYSTECTOMY  05/11/2012   Procedure: LAPAROSCOPIC CHOLECYSTECTOMY WITH INTRAOPERATIVE CHOLANGIOGRAM;  Surgeon: Randall Hiss  Ronnie Derby, MD,FACS;  Location: [facility];  Service: General;  Laterality: N/A;  . SHOULDER ARTHROSCOPY W/ ROTATOR CUFF REPAIR Right   . TUBAL LIGATION  10/25/1999   Archie Endo 01/11/2011    Family History  Problem Relation Age of Onset  . Stroke Mother   . Diabetes Father   . Diabetes Sister     Social History   Socioeconomic History  . Marital status: Single    Spouse name: Not on file  . Number of children:  Not on file  . Years of education: Not on file  . Highest education level: Not on file  Occupational History  . Not on file  Tobacco Use  . Smoking status: Current Every Day Smoker    Packs/day: 1.00    Years: 40.00    Pack years: 40.00    Types: Cigarettes  . Smokeless tobacco: Never Used  Substance and Sexual Activity  . Alcohol use: No  . Drug use: No  . Sexual activity: Yes    Birth control/protection: Surgical  Other Topics Concern  . Not on file  Social History Narrative  . Not on file   Social Determinants of Health   Financial Resource Strain:   . Difficulty of Paying Living Expenses:   Food Insecurity:   . Worried About Charity fundraiser in the Last Year:   . Arboriculturist in the Last Year:   Transportation Needs:   . Film/video editor (Medical):   Marland Kitchen Lack of Transportation (Non-Medical):   Physical Activity:   . Days of Exercise per Week:   . Minutes of Exercise per Session:   Stress:   . Feeling of Stress :   Social Connections:   . Frequency of Communication with Friends and Family:   . Frequency of Social Gatherings with Friends and Family:   . Attends Religious Services:   . Active Member of Clubs or Organizations:   . Attends Archivist Meetings:   Marland Kitchen Marital Status:      Observations/Objective: Awake, alert and oriented x 3   Review of Systems  Constitutional: Negative for fever, malaise/fatigue and weight loss.  HENT: Positive for congestion. Negative for nosebleeds.   Eyes: Negative.  Negative for blurred vision, double vision and photophobia.  Respiratory: Positive for shortness of breath. Negative for cough and wheezing.   Cardiovascular: Negative.  Negative for chest pain, palpitations and leg swelling.  Gastrointestinal: Negative.  Negative for heartburn, nausea and vomiting.  Musculoskeletal: Negative.  Negative for myalgias.  Neurological: Negative.  Negative for dizziness, focal weakness, seizures and headaches.   Endo/Heme/Allergies: Positive for environmental allergies.  Psychiatric/Behavioral: Negative.  Negative for suicidal ideas.    Assessment and Plan: Geri was seen today for diabetes and hypertension.  Diagnoses and all orders for this visit:  Uncontrolled type 2 diabetes mellitus with hyperglycemia (HCC) -     gabapentin (NEURONTIN) 600 MG tablet; Take 1 tablet (600 mg total) by mouth 3 (three) times daily. -     glucose blood test strip; Use as instructed -     insulin glargine (LANTUS SOLOSTAR) 100 UNIT/ML Solostar Pen; Inject 46 Units into the skin 2 (two) times daily. -     insulin aspart (NOVOLOG FLEXPEN) 100 UNIT/ML FlexPen; Inject 12 Units into the skin 3 (three) times daily with meals. -     Insulin Pen Needle (B-D UF III MINI PEN NEEDLES) 31G X 5 MM MISC; Inject into the skin weekly. -  TRUEplus Lancets 26G MISC; 1 each by Does not apply route every 8 (eight) hours as needed. -     Hemoglobin A1c; Future -     CMP14+EGFR; Future -     Ambulatory referral to Ophthalmology; Future Continue blood sugar control as discussed in office today, low carbohydrate diet, and regular physical exercise as tolerated, 150 minutes per week (30 min each day, 5 days per week, or 50 min 3 days per week). Keep blood sugar logs with fasting goal of 90-130 mg/dl, post prandial (after you eat) less than 180.  For Hypoglycemia: BS <60 and Hyperglycemia BS >400; contact the clinic ASAP. Annual eye exams and foot exams are recommended.  Will adjust medications based on upcoming lab results   Allergic rhinitis due to pollen, unspecified seasonality -     fluticasone (FLONASE) 50 MCG/ACT nasal spray; Place 2 sprays into both nostrils daily.  Mixed hyperlipidemia -     atorvastatin (LIPITOR) 40 MG tablet; Take 1 tablet (40 mg total) by mouth daily at 6 PM. -     Lipid panel; Future INSTRUCTIONS: Work on a low fat, heart healthy diet and participate in regular aerobic exercise program by working  out at least 150 minutes per week; 5 days a week-30 minutes per day. Avoid red meat/beef/steak,  fried foods. junk foods, sodas, sugary drinks, unhealthy snacking, alcohol and smoking.  Drink at least 80 oz of water per day and monitor your carbohydrate intake daily.    Essential hypertension -     lisinopril-hydrochlorothiazide (ZESTORETIC) 20-25 MG tablet; Take 1 tablet by mouth daily. -     CMP14+EGFR; Future  Screening for deficiency anemia -     CBC; Future  Colon cancer screening -     Fecal occult blood, imunochemical([facility]); Future  Insomnia, unspecified type -     QUEtiapine (SEROQUEL) 50 MG tablet; Take 1-2 tablets (50-100 mg total) by mouth at bedtime.  Mild persistent asthma without complication -     PROAIR HFA 108 (90 Base) MCG/ACT inhaler; Inhale 1-2 puffs into the lungs every 6 (six) hours as needed for wheezing or shortness of breath.  Other orders -     aspirin 81 MG EC tablet; Take 1 tablet (81 mg total) by mouth daily.     Follow Up Instructions Return in about 3 months (around 03/12/2020).     I discussed the assessment and treatment plan with the patient. The patient was provided an opportunity to ask questions and all were answered. The patient agreed with the plan and demonstrated an understanding of the instructions.   The patient was advised to call back or seek an in-person evaluation if the symptoms worsen or if the condition fails to improve as anticipated.  I provided 21 minutes of non-face-to-face time during this encounter including median intraservice time, reviewing previous notes, labs, imaging, medications and explaining diagnosis and management.  Gildardo Pounds, FNP-BC

## 2019-12-15 ENCOUNTER — Other Ambulatory Visit: Payer: Self-pay

## 2019-12-15 DIAGNOSIS — N6310 Unspecified lump in the right breast, unspecified quadrant: Secondary | ICD-10-CM

## 2019-12-18 ENCOUNTER — Other Ambulatory Visit: Payer: Self-pay

## 2019-12-18 ENCOUNTER — Ambulatory Visit: Payer: Self-pay | Attending: Nurse Practitioner

## 2019-12-18 DIAGNOSIS — Z130 Encounter for screening for diseases of the blood and blood-forming organs and certain disorders involving the immune mechanism: Secondary | ICD-10-CM

## 2019-12-18 DIAGNOSIS — E1165 Type 2 diabetes mellitus with hyperglycemia: Secondary | ICD-10-CM

## 2019-12-18 DIAGNOSIS — I10 Essential (primary) hypertension: Secondary | ICD-10-CM

## 2019-12-18 DIAGNOSIS — Z1211 Encounter for screening for malignant neoplasm of colon: Secondary | ICD-10-CM

## 2019-12-18 DIAGNOSIS — E782 Mixed hyperlipidemia: Secondary | ICD-10-CM

## 2019-12-19 LAB — HEMOGLOBIN A1C
Est. average glucose Bld gHb Est-mCnc: 223 mg/dL
Hgb A1c MFr Bld: 9.4 % — ABNORMAL HIGH (ref 4.8–5.6)

## 2019-12-19 LAB — CBC
Hematocrit: 43.2 % (ref 34.0–46.6)
Hemoglobin: 15.1 g/dL (ref 11.1–15.9)
MCH: 29.9 pg (ref 26.6–33.0)
MCHC: 35 g/dL (ref 31.5–35.7)
MCV: 86 fL (ref 79–97)
Platelets: 396 10*3/uL (ref 150–450)
RBC: 5.05 x10E6/uL (ref 3.77–5.28)
RDW: 12.9 % (ref 11.7–15.4)
WBC: 7.2 10*3/uL (ref 3.4–10.8)

## 2019-12-19 LAB — LIPID PANEL
Chol/HDL Ratio: 3.3 ratio (ref 0.0–4.4)
Cholesterol, Total: 156 mg/dL (ref 100–199)
HDL: 47 mg/dL (ref >39)
LDL Chol Calc (NIH): 74 mg/dL (ref 0–99)
Triglycerides: 212 mg/dL — ABNORMAL HIGH (ref 0–149)
VLDL Cholesterol Cal: 35 mg/dL (ref 5–40)

## 2019-12-19 LAB — CMP14+EGFR
ALT: 14 IU/L (ref 0–32)
AST: 19 IU/L (ref 0–40)
Albumin/Globulin Ratio: 1.4 (ref 1.2–2.2)
Albumin: 4.3 g/dL (ref 3.8–4.9)
Alkaline Phosphatase: 111 IU/L (ref 39–117)
BUN/Creatinine Ratio: 10 (ref 9–23)
BUN: 9 mg/dL (ref 6–24)
Bilirubin Total: 0.4 mg/dL (ref 0.0–1.2)
CO2: 27 mmol/L (ref 20–29)
Calcium: 10.2 mg/dL (ref 8.7–10.2)
Chloride: 102 mmol/L (ref 96–106)
Creatinine, Ser: 0.86 mg/dL (ref 0.57–1.00)
GFR calc Af Amer: 87 mL/min/{1.73_m2} (ref >59)
GFR calc non Af Amer: 76 mL/min/{1.73_m2} (ref >59)
Globulin, Total: 3 g/dL (ref 1.5–4.5)
Glucose: 88 mg/dL (ref 65–99)
Potassium: 3.7 mmol/L (ref 3.5–5.2)
Sodium: 144 mmol/L (ref 134–144)
Total Protein: 7.3 g/dL (ref 6.0–8.5)

## 2019-12-22 ENCOUNTER — Other Ambulatory Visit: Payer: Self-pay | Admitting: Nurse Practitioner

## 2019-12-22 MED ORDER — VICTOZA 18 MG/3ML ~~LOC~~ SOPN: PEN_INJECTOR | SUBCUTANEOUS | 6 refills | Status: DC

## 2019-12-23 MED FILL — !VICTOZA 18MG/3ML INJECT: 18 | 17 days supply | Qty: 3 | Fill #0

## 2020-01-01 ENCOUNTER — Other Ambulatory Visit: Payer: Self-pay

## 2020-01-01 ENCOUNTER — Encounter: Payer: Self-pay | Admitting: Medical

## 2020-01-01 ENCOUNTER — Ambulatory Visit: Payer: Self-pay | Admitting: Medical

## 2020-01-01 VITALS — BP 140/90 | Temp 97.3°F | Wt 199.0 lb

## 2020-01-01 DIAGNOSIS — N6310 Unspecified lump in the right breast, unspecified quadrant: Secondary | ICD-10-CM

## 2020-01-01 NOTE — Progress Notes (Signed)
[name] is a 54 y.o. female who presents to [facility] clinic today with complaint of right breast mass x 2 years. She denies any changes in size. No pain. No drainage from the mass or the nipple.    Pap Smear: Pap not smear completed today. Last Pap smear was 2007 at unknown clinic and was normal. Per patient has no history of an abnormal Pap smear. Last Pap smear result is not available in Epic. Patient had hysterectomy for fibroids.    Physical exam: Breasts Breasts symmetrical. No skin abnormalities bilateral breasts. No nipple retraction bilateral breasts. No nipple discharge bilateral breasts. No lymphadenopathy. 1 cm mobile, soft mass noted a 9:00 on the right breast. No lumps palpated on the left breast.    Pelvic/Bimanual Pap is not indicated today    Smoking History: Patient has is a current smoker at 1.0 packs per day Referred to quit line.    Patient Navigation: Patient education provided. Access to services provided for patient through [place] program.    Colorectal Cancer Screening: Per patient has never had colonoscopy completed No complaints today.    Breast and Cervical Cancer Risk Assessment: Patient has family history of breast cancer (mother, maternal aunts x 2, maternal grandmother), no known genetic mutations, or radiation treatment to the chest before age 34. Patient does not have history of cervical dysplasia, immunocompromised, or DES exposure in-utero.  Risk Assessment    Risk Scores      01/01/2020   Last edited by: Demetrius Revel, LPN   5-year risk: 2.2 %   Lifetime risk: 12 %          A: [facility] exam without pap smear Complaint of right breat mass   P: Referred patient to the [facility] for a diagnostic mammogram. Appointment scheduled 01/08/20 @ 9:40 am.  Luvenia Redden, PA-C 01/01/2020 3:32 PM

## 2020-01-08 ENCOUNTER — Ambulatory Visit
Admission: RE | Admit: 2020-01-08 | Discharge: 2020-01-08 | Disposition: A | Discharge status: Home / Self Care | Payer: Self-pay | Source: Ambulatory Visit | Attending: Obstetrics and Gynecology | Admitting: Obstetrics and Gynecology

## 2020-01-08 ENCOUNTER — Other Ambulatory Visit: Payer: Self-pay

## 2020-01-08 ENCOUNTER — Ambulatory Visit: Admission: RE | Admit: 2020-01-08 | Payer: Self-pay | Source: Ambulatory Visit

## 2020-01-08 DIAGNOSIS — N6310 Unspecified lump in the right breast, unspecified quadrant: Secondary | ICD-10-CM

## 2020-01-13 MED FILL — VICTOZA 18 MG/3 ML INJECT P: 18 | 30 days supply | Qty: 9 | Fill #1

## 2020-01-17 ENCOUNTER — Emergency Department (HOSPITAL_COMMUNITY): Payer: No Typology Code available for payment source

## 2020-01-17 ENCOUNTER — Other Ambulatory Visit: Payer: Self-pay

## 2020-01-17 ENCOUNTER — Emergency Department (HOSPITAL_COMMUNITY)
Admission: EM | Admit: 2020-01-17 | Discharge: 2020-01-18 | Disposition: A | Discharge status: Home / Self Care | Payer: No Typology Code available for payment source | Attending: Emergency Medicine | Admitting: Emergency Medicine

## 2020-01-17 DIAGNOSIS — F17210 Nicotine dependence, cigarettes, uncomplicated: Secondary | ICD-10-CM | POA: Insufficient documentation

## 2020-01-17 DIAGNOSIS — R202 Paresthesia of skin: Secondary | ICD-10-CM

## 2020-01-17 DIAGNOSIS — E119 Type 2 diabetes mellitus without complications: Secondary | ICD-10-CM | POA: Insufficient documentation

## 2020-01-17 DIAGNOSIS — Z794 Long term (current) use of insulin: Secondary | ICD-10-CM | POA: Insufficient documentation

## 2020-01-17 DIAGNOSIS — N390 Urinary tract infection, site not specified: Secondary | ICD-10-CM | POA: Insufficient documentation

## 2020-01-17 DIAGNOSIS — D443 Neoplasm of uncertain behavior of pituitary gland: Secondary | ICD-10-CM | POA: Insufficient documentation

## 2020-01-17 DIAGNOSIS — Z79899 Other long term (current) drug therapy: Secondary | ICD-10-CM | POA: Insufficient documentation

## 2020-01-17 DIAGNOSIS — R200 Anesthesia of skin: Secondary | ICD-10-CM | POA: Insufficient documentation

## 2020-01-17 DIAGNOSIS — R1013 Epigastric pain: Secondary | ICD-10-CM | POA: Insufficient documentation

## 2020-01-17 DIAGNOSIS — I10 Essential (primary) hypertension: Secondary | ICD-10-CM | POA: Insufficient documentation

## 2020-01-17 LAB — I-STAT CHEM 8, ED
BUN: 19 mg/dL (ref 6–20)
Calcium, Ion: 1.1 mmol/L — ABNORMAL LOW (ref 1.15–1.40)
Chloride: 103 mmol/L (ref 98–111)
Creatinine, Ser: 0.9 mg/dL (ref 0.44–1.00)
Glucose, Bld: 242 mg/dL — ABNORMAL HIGH (ref 70–99)
HCT: 39 % (ref 36.0–46.0)
Hemoglobin: 13.3 g/dL (ref 12.0–15.0)
Potassium: 4 mmol/L (ref 3.5–5.1)
Sodium: 139 mmol/L (ref 135–145)
TCO2: 33 mmol/L — ABNORMAL HIGH (ref 22–32)

## 2020-01-17 LAB — CBC
HCT: 44.4 % (ref 36.0–46.0)
Hemoglobin: 14.3 g/dL (ref 12.0–15.0)
MCH: 29.5 pg (ref 26.0–34.0)
MCHC: 32.2 g/dL (ref 30.0–36.0)
MCV: 91.5 fL (ref 80.0–100.0)
Platelets: 299 10*3/uL (ref 150–400)
RBC: 4.85 MIL/uL (ref 3.87–5.11)
RDW: 12.2 % (ref 11.5–15.5)
WBC: 7.3 10*3/uL (ref 4.0–10.5)
nRBC: 0 % (ref 0.0–0.2)

## 2020-01-17 LAB — PROTIME-INR
INR: 1 (ref 0.8–1.2)
Prothrombin Time: 12.5 seconds (ref 11.4–15.2)

## 2020-01-17 LAB — URINALYSIS, ROUTINE W REFLEX MICROSCOPIC
Bilirubin Urine: NEGATIVE
Glucose, UA: 50 mg/dL — AB
Ketones, ur: NEGATIVE mg/dL
Nitrite: POSITIVE — AB
Protein, ur: NEGATIVE mg/dL
Specific Gravity, Urine: 1.018 (ref 1.005–1.030)
pH: 5 (ref 5.0–8.0)

## 2020-01-17 LAB — COMPREHENSIVE METABOLIC PANEL
ALT: 15 U/L (ref 0–44)
AST: 17 U/L (ref 15–41)
Albumin: 3.3 g/dL — ABNORMAL LOW (ref 3.5–5.0)
Alkaline Phosphatase: 83 U/L (ref 38–126)
Anion gap: 11 (ref 5–15)
BUN: 17 mg/dL (ref 6–20)
CO2: 26 mmol/L (ref 22–32)
Calcium: 9.1 mg/dL (ref 8.9–10.3)
Chloride: 104 mmol/L (ref 98–111)
Creatinine, Ser: 0.86 mg/dL (ref 0.44–1.00)
GFR calc Af Amer: >60 mL/min (ref >60)
GFR calc non Af Amer: >60 mL/min (ref >60)
Glucose, Bld: 240 mg/dL — ABNORMAL HIGH (ref 70–99)
Potassium: 4.2 mmol/L (ref 3.5–5.1)
Sodium: 141 mmol/L (ref 135–145)
Total Bilirubin: 0.9 mg/dL (ref 0.3–1.2)
Total Protein: 6.6 g/dL (ref 6.5–8.1)

## 2020-01-17 LAB — DIFFERENTIAL
Abs Immature Granulocytes: 0.02 10*3/uL (ref 0.00–0.07)
Basophils Absolute: 0.1 10*3/uL (ref 0.0–0.1)
Basophils Relative: 1 %
Eosinophils Absolute: 0.3 10*3/uL (ref 0.0–0.5)
Eosinophils Relative: 4 %
Immature Granulocytes: 0 %
Lymphocytes Relative: 30 %
Lymphs Abs: 2.2 10*3/uL (ref 0.7–4.0)
Monocytes Absolute: 0.6 10*3/uL (ref 0.1–1.0)
Monocytes Relative: 8 %
Neutro Abs: 4.2 10*3/uL (ref 1.7–7.7)
Neutrophils Relative %: 57 %

## 2020-01-17 LAB — RAPID URINE DRUG SCREEN, HOSP PERFORMED
Amphetamines: NOT DETECTED
Barbiturates: NOT DETECTED
Benzodiazepines: NOT DETECTED
Cocaine: NOT DETECTED
Opiates: POSITIVE — AB
Tetrahydrocannabinol: NOT DETECTED

## 2020-01-17 LAB — APTT: aPTT: 23 seconds — ABNORMAL LOW (ref 24–36)

## 2020-01-17 LAB — ETHANOL: Alcohol, Ethyl (B): <10 mg/dL (ref <10)

## 2020-01-17 MED ORDER — SODIUM CHLORIDE 0.9 % IV BOLUS
1000.0000 mL | Freq: Once | INTRAVENOUS | Status: AC
  Administered 2020-01-17: 1000 mL via INTRAVENOUS

## 2020-01-17 MED ORDER — LORAZEPAM 2 MG/ML IJ SOLN
1.0000 mg | Freq: Once | INTRAMUSCULAR | Status: AC
  Administered 2020-01-17: 1 mg via INTRAVENOUS
  Filled 2020-01-17: qty 1

## 2020-01-17 MED ORDER — FAMOTIDINE IN NACL 20-0.9 MG/50ML-% IV SOLN
20.0000 mg | Freq: Once | INTRAVENOUS | Status: AC
  Administered 2020-01-17: 20 mg via INTRAVENOUS
  Filled 2020-01-17: qty 50

## 2020-01-17 NOTE — ED Provider Notes (Signed)
Care assumed from [facility].  Please see her full H&P.  In short,  Barbara Wang is a 54 y.o. female presents for multiple complaints.  Patient with a history of pituitary tumor, hypertension, hyperlipidemia.  She presents tonight with a complaint of left arm and hand numbness which began last night.  No previous issues.  No other strokelike symptoms.  Normal neurologic exam on initial provider evaluation except for subjective sensation deficit in the left upper extremity.  Additionally, patient with planes of persistent UTI.  She was recently treated with Cipro.. Patient reports some suprapubic abdominal pain, foul odor with urination and urinary frequency.  She reports the symptoms are similar to previous urinary tract infections.  Physical Exam  BP (!) 148/91   Pulse (!) 102   Temp 98.8 F (37.1 C) (Oral)   Resp 18   Ht 5\' 4"  (1.626 m)   Wt 89.4 kg   SpO2 97%   BMI 33.81 kg/m   Physical Exam Vitals and nursing note reviewed.  Constitutional:      General: She is not in acute distress.    Appearance: She is well-developed.  HENT:     Head: Normocephalic.  Eyes:     General: No scleral icterus.    Conjunctiva/sclera: Conjunctivae normal.  Cardiovascular:     Rate and Rhythm: Normal rate.  Pulmonary:     Effort: Pulmonary effort is normal.  Musculoskeletal:        General: Normal range of motion.     Cervical back: Normal range of motion.  Skin:    General: Skin is warm and dry.  Neurological:     Mental Status: She is alert.     Comments: Ambulates unassisted with a gait and without assistance.     ED Course/Procedures   Clinical Course as of Jan 18 644  Sat Jan 17, 2020  2333 Plan: MRI pending   [HM]    Clinical Course User Index [HM] Lien Lyman, Gwenlyn Perking    Procedures   Results for orders placed or performed during the hospital encounter of 01/17/20  Ethanol  Result Value Ref Range   Alcohol, Ethyl (B) <10 <10 mg/dL  Protime-INR  Result  Value Ref Range   Prothrombin Time 12.5 11.4 - 15.2 seconds   INR 1.0 0.8 - 1.2  APTT  Result Value Ref Range   aPTT 23 (L) 24 - 36 seconds  CBC  Result Value Ref Range   WBC 7.3 4.0 - 10.5 K/uL   RBC 4.85 3.87 - 5.11 MIL/uL   Hemoglobin 14.3 12.0 - 15.0 g/dL   HCT 44.4 36.0 - 46.0 %   MCV 91.5 80.0 - 100.0 fL   MCH 29.5 26.0 - 34.0 pg   MCHC 32.2 30.0 - 36.0 g/dL   RDW 12.2 11.5 - 15.5 %   Platelets 299 150 - 400 K/uL   nRBC 0.0 0.0 - 0.2 %  Differential  Result Value Ref Range   Neutrophils Relative % 57 %   Neutro Abs 4.2 1.7 - 7.7 K/uL   Lymphocytes Relative 30 %   Lymphs Abs 2.2 0.7 - 4.0 K/uL   Monocytes Relative 8 %   Monocytes Absolute 0.6 0.1 - 1.0 K/uL   Eosinophils Relative 4 %   Eosinophils Absolute 0.3 0.0 - 0.5 K/uL   Basophils Relative 1 %   Basophils Absolute 0.1 0.0 - 0.1 K/uL   Immature Granulocytes 0 %   Abs Immature Granulocytes 0.02 0.00 - 0.07 K/uL  Comprehensive metabolic panel  Result Value Ref Range   Sodium 141 135 - 145 mmol/L   Potassium 4.2 3.5 - 5.1 mmol/L   Chloride 104 98 - 111 mmol/L   CO2 26 22 - 32 mmol/L   Glucose, Bld 240 (H) 70 - 99 mg/dL   BUN 17 6 - 20 mg/dL   Creatinine, Ser 0.86 0.44 - 1.00 mg/dL   Calcium 9.1 8.9 - 10.3 mg/dL   Total Protein 6.6 6.5 - 8.1 g/dL   Albumin 3.3 (L) 3.5 - 5.0 g/dL   AST 17 15 - 41 U/L   ALT 15 0 - 44 U/L   Alkaline Phosphatase 83 38 - 126 U/L   Total Bilirubin 0.9 0.3 - 1.2 mg/dL   GFR calc non Af Amer >60 >60 mL/min   GFR calc Af Amer >60 >60 mL/min   Anion gap 11 5 - 15  Urine rapid drug screen (hosp performed)  Result Value Ref Range   Opiates POSITIVE (A) NONE DETECTED   Cocaine NONE DETECTED NONE DETECTED   Benzodiazepines NONE DETECTED NONE DETECTED   Amphetamines NONE DETECTED NONE DETECTED   Tetrahydrocannabinol NONE DETECTED NONE DETECTED   Barbiturates NONE DETECTED NONE DETECTED  Urinalysis, Routine w reflex microscopic  Result Value Ref Range   Color, Urine YELLOW YELLOW    APPearance HAZY (A) CLEAR   Specific Gravity, Urine 1.018 1.005 - 1.030   pH 5.0 5.0 - 8.0   Glucose, UA 50 (A) NEGATIVE mg/dL   Hgb urine dipstick SMALL (A) NEGATIVE   Bilirubin Urine NEGATIVE NEGATIVE   Ketones, ur NEGATIVE NEGATIVE mg/dL   Protein, ur NEGATIVE NEGATIVE mg/dL   Nitrite POSITIVE (A) NEGATIVE   Leukocytes,Ua SMALL (A) NEGATIVE   RBC / HPF 0-5 0 - 5 RBC/hpf   WBC, UA 0-5 0 - 5 WBC/hpf   Bacteria, UA RARE (A) NONE SEEN   Squamous Epithelial / LPF 0-5 0 - 5  I-stat chem 8, ED  Result Value Ref Range   Sodium 139 135 - 145 mmol/L   Potassium 4.0 3.5 - 5.1 mmol/L   Chloride 103 98 - 111 mmol/L   BUN 19 6 - 20 mg/dL   Creatinine, Ser 0.90 0.44 - 1.00 mg/dL   Glucose, Bld 242 (H) 70 - 99 mg/dL   Calcium, Ion 1.10 (L) 1.15 - 1.40 mmol/L   TCO2 33 (H) 22 - 32 mmol/L   Hemoglobin 13.3 12.0 - 15.0 g/dL   HCT 39.0 36.0 - 46.0 %   CT HEAD WO CONTRAST  Result Date: 01/17/2020 CLINICAL DATA:  Numbness and tingling in left arm and fingers EXAM: CT HEAD WITHOUT CONTRAST TECHNIQUE: Contiguous axial images were obtained from the base of the skull through the vertex without intravenous contrast. COMPARISON:  12/31/2018 FINDINGS: Brain: No acute infarct or hemorrhage. Lateral ventricles and midline structures are unremarkable. No acute extra-axial fluid collections. No mass effect. Vascular: No hyperdense vessel or unexpected calcification. Skull: Normal. Negative for fracture or focal lesion. Sinuses/Orbits: No acute finding. Other: None IMPRESSION: No acute intracranial process. Electronically Signed   By: Randa Ngo M.D.   On: 01/17/2020 19:20   MR Brain W and Wo Contrast  Result Date: 01/18/2020 CLINICAL DATA:  Initial evaluation for right upper extremity numbness. EXAM: MRI HEAD WITHOUT AND WITH CONTRAST TECHNIQUE: Multiplanar, multiecho pulse sequences of the brain and surrounding structures were obtained without and with intravenous contrast. CONTRAST:  43mL GADAVIST  GADOBUTROL 1 MMOL/ML IV SOLN COMPARISON:  Comparison made with  prior CT from 01/17/2020 as well as previous MRI from 03/21/2018. FINDINGS: Brain: Generalized age-related cerebral atrophy. Patchy T2/FLAIR hyperintensity within the periventricular deep white matter both cerebral hemispheres most consistent with chronic small vessel ischemic disease. Few scattered superimposed remote lacunar infarcts present within the hemispheric cerebral white matter and cerebellum. No abnormal foci of restricted diffusion to suggest acute or subacute ischemia. Gray-white matter differentiation maintained. No encephalomalacia to suggest chronic cortical infarction. No evidence for acute or chronic intracranial hemorrhage. Previously identified meningioma centered along the planum sphenoidale is unchanged measuring 2.8 x 3.0 x 2.4 cm. No significant surrounding mass effect or vasogenic edema. No other mass lesion or abnormal enhancement. No midline shift or hydrocephalus. No extra-axial fluid collection. Vascular: Major intracranial vascular flow voids are maintained. Skull and upper cervical spine: Craniocervical junction within normal limits. Upper cervical spine normal. Bone marrow signal intensity within normal limits. No scalp soft tissue abnormality. Sinuses/Orbits: Globes and orbital soft tissues within normal limits. Paranasal sinuses are largely clear. Trace opacity noted within the left mastoid air cells, of doubtful significance. Inner ear structures grossly normal. Other: None. IMPRESSION: 1. No acute intracranial abnormality. 2. 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 3. Age-related cerebral atrophy with chronic small vessel ischemic disease. Electronically Signed   By: Jeannine Boga M.D.   On: 01/18/2020 05:26   MS DIGITAL DIAG TOMO BILAT  Result Date: 01/08/2020 CLINICAL DATA:  54 year old female presenting with a lump in the right breast which is no longer present. She is also due for  annual exam. EXAM: DIGITAL DIAGNOSTIC BILATERAL MAMMOGRAM WITH CAD AND TOMO COMPARISON:  Previous exam(s). ACR Breast Density Category a: The breast tissue is almost entirely fatty. FINDINGS: Right breast: No suspicious mass, distortion, or microcalcifications are identified to suggest presence of malignancy. Left breast: Spot 2D magnification views were performed in addition to standard full field views. There are benign grouped round calcifications in the upper outer left breast which are most likely within the skin. Mammographic images were processed with CAD. IMPRESSION: No mammographic evidence of malignancy in the bilateral breasts. RECOMMENDATION: Screening mammogram in one year.(Code:SM-B-01Y) I have discussed the findings and recommendations with the patient. If applicable, a reminder letter will be sent to the patient regarding the next appointment. BI-RADS CATEGORY  2: Benign. Electronically Signed   By: Audie Pinto M.D.   On: 01/08/2020 10:53     MDM    Patient presents with numerous complaints.  Pending MRI at shift change.  6:33 AM Patient reports she is feeling significantly better.  She ambulates without assistance.  Sensation deficit has improved.  MRI is without evidence of CVA.  No change in the size of patient's pituitary tumor.   Patient also with concerns about persistent UTI.  She has a history of penicillin allergy however has tolerated ceftriaxone without difficulty.  Will give Keflex.  Repeat urine culture has been sent.  Will need to see primary care close follow-up within the next 2 days.  Discussed reasons return immediately to the emergency department including new or worsening neurologic symptoms, fever, vomiting or other concerns.   Paresthesias  Recurrent UTI    Agapito Games 01/18/20 [id number]    Palumbo, April, MD 01/18/20 [id number]

## 2020-01-17 NOTE — ED Triage Notes (Signed)
Pt presents from home for weakness and "sugars running a little bit higher", states that she feels this has been since receiving 1st covid vaccine 4/28.   Pt was told by UC that she had a UTi "with MRSA in it" 2 weeks ago, was given abx (cipro, then vancomycin) for this. Continues to have urinary sx, odor, dysuria.  Has also been taking azo.  When asked pt what brought her to ED today specifically, states that L hand became numb last night. H/o brain tumor diagnosed in July 2019, no f/u since see neurologist at that time, pituitary tumor

## 2020-01-17 NOTE — ED Provider Notes (Signed)
[facility] EMERGENCY DEPARTMENT Provider Note   CSN: [id number] Arrival date & time: 01/17/20  1801     History Chief Complaint  Patient presents with  . Weakness    Barbara Wang is a 54 y.o. female with a past medical history of obesity, hypertension, hyperlipidemia, pituitary tumor who presents emergency department for left arm and hand numbness which began last night before bed.  Patient states she has never had this before.  She does have some chronic left eye deficits she states "because the tumor is pressing on my left optic nerve."  She denies any other neurologic complaints such as headache, unilateral weakness, difficulty with speech or swallowing, imbalance.  The patient also states that she was treated for urinary tract infection.  She thinks she was treated with Cipro and oral vancomycin.  I reviewed the chart and it appears that her culture and sensitivity report showed that it was pansensitive that she could continue on Cipro I do not see that she was placed on oral vancomycin for a urinary tract infection.  She complains that she still having some suprapubic abdominal pain, foul odor with urination and is also having some epigastric abdominal pain "like heartburn" which she has taken Gas-X for which has not been relieved.  She denies nausea, vomiting, chest pain, shortness of breath, diaphoresis.  HPI     Past Medical History:  Diagnosis Date  . Asthma   . Brain tumor ([place])   . Cellulitis 06/2015   rt hand   . Depression   . History of hiatal hernia    "it went away on it's own"  . Hyperlipemia   . Hypertension   . Neuropathy     "feet & hands "  . Obesity   . Pancreatitis   . 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 Wrist pain, acute, right 07/22/2019  . Mixed hyperlipidemia 07/22/2019  . 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 Asthma 03/16/2017  . Obesity   .  Essential hypertension   . Poorly controlled type 2 diabetes mellitus with complication [facility])     Past Surgical History:  Procedure Laterality Date  . ABDOMINAL HYSTERECTOMY    . BREAST CYST EXCISION Right 10+ yrs ago   abscess removed  . [place]; 1987  . CHOLECYSTECTOMY  05/11/2012   Procedure: LAPAROSCOPIC CHOLECYSTECTOMY WITH INTRAOPERATIVE CHOLANGIOGRAM;  Surgeon: Gayland Curry, MD,FACS;  Location: [place];  Service: General;  Laterality: N/A;  . SHOULDER ARTHROSCOPY W/ ROTATOR CUFF REPAIR Right   . TUBAL LIGATION  10/25/1999   Archie Endo 01/11/2011     OB History   No obstetric history on file.     Family History  Problem Relation Age of Onset  . Stroke Mother   . Breast cancer Mother 68  . Diabetes Father   . Diabetes Sister   . Breast cancer Maternal Aunt   . Breast cancer Maternal Aunt   . Breast cancer Maternal Grandmother     Social History   Tobacco Use  . Smoking status: Current Every Day Smoker    Packs/day: 1.00    Years: 40.00    Pack years: 40.00    Types: Cigarettes  . Smokeless tobacco: Never Used  Substance Use Topics  . Alcohol use: No  . Drug use: No    Home Medications Prior to Admission medications   Medication Sig Start  Date End Date Taking? Authorizing Provider  acetaminophen (TYLENOL) 500 MG tablet Take 1,000 mg by mouth every 6 (six) hours as needed for moderate pain.    [provider]  aspirin 81 MG EC tablet Take 1 tablet (81 mg total) by mouth daily. 12/12/19   Gildardo Pounds, NP  atorvastatin (LIPITOR) 40 MG tablet Take 1 tablet (40 mg total) by mouth daily at 6 PM. 12/12/19   Gildardo Pounds, NP  Blood Glucose Monitoring Suppl (TRUE METRIX GO GLUCOSE METER) w/Device KIT 1 each by Does not apply route every 8 (eight) hours as needed. 09/18/16   Maren Reamer, MD  cetirizine (ZYRTEC) 10 MG tablet Take 1 tablet (10 mg total) by mouth daily. Patient taking differently: Take 10 mg by mouth daily as needed for  allergies.  02/15/18   Gildardo Pounds, NP  fluticasone (FLONASE) 50 MCG/ACT nasal spray Place 2 sprays into both nostrils daily. 12/12/19   Gildardo Pounds, NP  gabapentin (NEURONTIN) 600 MG tablet Take 1 tablet (600 mg total) by mouth 3 (three) times daily. 12/12/19   Gildardo Pounds, NP  glucose blood test strip Use as instructed 12/12/19   Gildardo Pounds, NP  ibuprofen (ADVIL) 200 MG tablet Take 4 tablets (800 mg total) by mouth every 6 (six) hours as needed for moderate pain. 07/22/19   Elsie Stain, MD  insulin aspart (NOVOLOG FLEXPEN) 100 UNIT/ML FlexPen Inject 12 Units into the skin 3 (three) times daily with meals. 12/12/19 03/11/20  Gildardo Pounds, NP  insulin glargine (LANTUS SOLOSTAR) 100 UNIT/ML Solostar Pen Inject 46 Units into the skin 2 (two) times daily. 12/12/19   Gildardo Pounds, NP  Insulin Pen Needle (B-D UF III MINI PEN NEEDLES) 31G X 5 MM MISC Inject into the skin weekly. 12/12/19   Gildardo Pounds, NP  liraglutide (VICTOZA) 18 MG/3ML SOPN SubQ:  Inject 0.6 mg once daily into the skin. Week 2: increase to 1.2 mg once daily; week 3: increase to 1.8 mg once daily. You should only increase weekly if there are no frequent hypoglycemic readings less than 90. 12/22/19   Gildardo Pounds, NP  lisinopril-hydrochlorothiazide (ZESTORETIC) 20-25 MG tablet Take 1 tablet by mouth daily. 12/12/19 03/11/20  Gildardo Pounds, NP  loratadine (CLARITIN) 10 MG tablet Take by mouth daily as needed for allergies.  03/22/14   [provider]  PROAIR HFA 108 (90 Base) MCG/ACT inhaler Inhale 1-2 puffs into the lungs every 6 (six) hours as needed for wheezing or shortness of breath. 12/12/19   Gildardo Pounds, NP  QUEtiapine (SEROQUEL) 50 MG tablet Take 1-2 tablets (50-100 mg total) by mouth at bedtime. 12/12/19 01/11/20  Gildardo Pounds, NP  sodium chloride (OCEAN) 0.65 % SOLN nasal spray Place 1 spray into both nostrils every 4 (four) hours as needed for congestion. 01/02/19   Arrien, Jimmy Picket, MD  TRUEplus Lancets 26G MISC 1 each by Does not apply route every 8 (eight) hours as needed. 12/12/19   Gildardo Pounds, NP  insulin lispro (HUMALOG) 100 UNIT/ML KwikPen Inject 0.12 mLs (12 Units total) into the skin 3 (three) times daily. 07/22/19 12/12/19  Elsie Stain, MD    Allergies    Penicillins, Azithromycin, Erythromycin base, Sulfa antibiotics, and Tramadol  Review of Systems   Review of Systems Ten systems reviewed and are negative for acute change, except as noted in the HPI.   Physical Exam Updated Vital Signs BP 125/80 (  BP Location: Right Arm)   Pulse (!) 111   Temp 98.8 F (37.1 C) (Oral)   Resp 18   Ht '5\' 4"'$  (1.626 m)   Wt 89.4 kg   SpO2 100%   BMI 33.81 kg/m   Physical Exam Vitals and nursing note reviewed.  Constitutional:      General: She is not in acute distress.    Appearance: She is well-developed. She is not diaphoretic.  HENT:     Head: Normocephalic and atraumatic.  Eyes:     General: No scleral icterus.    Conjunctiva/sclera: Conjunctivae normal.  Cardiovascular:     Rate and Rhythm: Normal rate and regular rhythm.     Heart sounds: Normal heart sounds. No murmur. No friction rub. No gallop.   Pulmonary:     Effort: Pulmonary effort is normal. No respiratory distress.     Breath sounds: Normal breath sounds.  Abdominal:     General: Bowel sounds are normal. There is no distension.     Palpations: Abdomen is soft. There is no mass.     Tenderness: There is no abdominal tenderness. There is no guarding.  Musculoskeletal:     Cervical back: Normal range of motion.  Skin:    General: Skin is warm and dry.  Neurological:     General: No focal deficit present.     Mental Status: She is alert and oriented to person, place, and time.     Cranial Nerves: No cranial nerve deficit.     Sensory: Sensory deficit present.     Motor: No weakness.     Coordination: Coordination normal.     Gait: Gait normal.     Deep Tendon Reflexes:  Reflexes normal.  Psychiatric:        Behavior: Behavior normal.     ED Results / Procedures / Treatments   Labs (all labs ordered are listed, but only abnormal results are displayed) Labs Reviewed  ETHANOL  PROTIME-INR  APTT  CBC  DIFFERENTIAL  COMPREHENSIVE METABOLIC PANEL  RAPID URINE DRUG SCREEN, HOSP PERFORMED  URINALYSIS, ROUTINE W REFLEX MICROSCOPIC  I-STAT CHEM 8, ED    EKG None  Radiology No results found.  Procedures Procedures (including critical care time)  Medications Ordered in ED Medications  sodium chloride 0.9 % bolus 1,000 mL (has no administration in time range)  famotidine (PEPCID) IVPB 20 mg premix (has no administration in time range)    ED Course  I have reviewed the triage vital signs and the nursing notes.  Pertinent labs & imaging results that were available during my care of the patient were reviewed by me and considered in my medical decision making (see chart for details).  Clinical Course as of Jan 19 1043  Sat Jan 17, 2020  2333 Plan: MRI pending   [HM]    Clinical Course User Index [HM] Muthersbaugh, Gwenlyn Perking   MDM Rules/Calculators/A&P                     54 year old female here with multiple complaints, chiefly she has left arm numbness, and a known pituitary mass. I ordered interpreted and reviewed the patient's labs which show UDS positive for opiates, CMP shows elevated blood glucose without other significant abnormality. CBC, differential, PT/INR, ethanol level all within normal limits. Urine appears questionable for infection I have sent a culture. APTT is slightly low but of insignificant value. I ordered and reviewed the images of the head CT which shows no acute  abnormalities, MRI is pending. PA Muthersbaugh will assume care. Plan to discharge if negative. EKG shows sinus tachycardia at a rate of 109. Final Clinical Impression(s) / ED Diagnoses Final diagnoses:  None    Rx / DC Orders ED Discharge Orders    None         Margarita Mail, PA-C 01/19/20 [place], [place], DO 01/21/20 [phone]

## 2020-01-17 NOTE — ED Notes (Addendum)
Pt transported to MRI 

## 2020-01-18 MED ORDER — GADOBUTROL 1 MMOL/ML IV SOLN
10.0000 mL | Freq: Once | INTRAVENOUS | Status: AC | PRN
  Administered 2020-01-18: 10 mL via INTRAVENOUS

## 2020-01-18 MED ORDER — FLUCONAZOLE 150 MG PO TABS
150.0000 mg | ORAL_TABLET | ORAL | 0 refills | Status: DC
Start: 2020-01-18 — End: 2021-05-26

## 2020-01-18 MED ORDER — CEPHALEXIN 500 MG PO CAPS: ORAL_CAPSULE | ORAL | 0 refills | Status: DC

## 2020-01-18 NOTE — Discharge Instructions (Addendum)
1. Medications: Keflex, Diflucan, usual home medications 2. Treatment: rest, drink plenty of fluids, 3. Follow Up: Please followup with your primary doctor in 2 days for discussion of your diagnoses and further evaluation after today's visit; if you do not have a primary care doctor use the resource guide provided to find one; Please return to the ER for new or worsening symptoms.

## 2020-01-18 NOTE — ED Notes (Signed)
Pt provided with PO fluids and food per EDP.

## 2020-01-18 NOTE — ED Notes (Signed)
Patient verbalizes understanding of discharge instructions. Opportunity for questioning and answers were provided. Armband removed by staff, pt discharged from ED in wheelchair to home.   

## 2020-01-18 NOTE — ED Notes (Signed)
Pt to remain NPO until MRI results

## 2020-01-18 NOTE — ED Notes (Signed)
Pt returned from MRI °

## 2020-01-18 NOTE — ED Notes (Signed)
Pt ambulated well to the BR with minimal assistance

## 2020-01-18 NOTE — ED Notes (Signed)
This RN called MRI regarding this pts pending MRI results. This RN was informed that they should result shortly.

## 2020-01-19 MED FILL — CEPHALEXIN 500 MG CAPSULE: 500 | 10 days supply | Qty: 20 | Fill #0

## 2020-01-19 MED FILL — FLUCONAZOLE 150 MG TABLET: 150 | 4 days supply | Qty: 2 | Fill #0

## 2020-01-20 LAB — URINE CULTURE: Culture: >=100000 — AB

## 2020-01-21 ENCOUNTER — Telehealth: Payer: Self-pay | Admitting: *Deleted

## 2020-01-21 NOTE — Telephone Encounter (Signed)
Post ED Visit - Positive Culture Follow-up  Culture report reviewed by antimicrobial stewardship pharmacist: [place] Team []  Elenor Quinones, Pharm.D. []  Heide Guile, Pharm.D., BCPS AQ-ID []  Parks Neptune, Pharm.D., BCPS []  Alycia Rossetti, Pharm.D., BCPS []  [place], [place].D., BCPS, AAHIVP []  Legrand Como, Pharm.D., BCPS, AAHIVP []  Salome Arnt, PharmD, BCPS []  Johnnette Gourd, PharmD, BCPS []  Hughes Better, PharmD, BCPS []  Leeroy Cha, PharmD []  Laqueta Linden, PharmD, BCPS []  Albertina Parr, PharmD Nicoletta Dress, [place] Team []  Leodis Sias, PharmD []  Lindell Spar, PharmD []  Royetta Asal, PharmD []  Graylin Shiver, Rph []  Rema Fendt) Glennon Mac, PharmD []  Arlyn Dunning, PharmD []  Netta Cedars, PharmD []  Dia Sitter, PharmD []  Leone Haven, PharmD []  Gretta Arab, PharmD []  Theodis Shove, PharmD []  Peggyann Juba, PharmD []  Reuel Boom, PharmD   Positive urine culture Treated with Cephalexin, organism sensitive to the same and no further patient follow-up is required at this time.  Harlon Flor [facility] 01/21/2020, 10:20 AM

## 2020-01-23 MED FILL — $LANTUS SOLOSTAR 100 UNITS/: 100 | 32 days supply | Qty: 30 | Fill #2

## 2020-01-23 MED FILL — $HUMALOG 100 UNITS/ML KWIKP: 100 | 16 days supply | Qty: 6 | Fill #4

## 2020-02-13 ENCOUNTER — Other Ambulatory Visit: Payer: Self-pay | Admitting: Nurse Practitioner

## 2020-02-13 MED FILL — VICTOZA 18 MG/3 ML INJECT P: 18 | 30 days supply | Qty: 9 | Fill #2

## 2020-02-13 MED FILL — $HUMALOG 100 UNITS/ML KWIKP: 100 | 48 days supply | Qty: 18 | Fill #5

## 2020-03-19 ENCOUNTER — Other Ambulatory Visit: Payer: Self-pay | Admitting: Family Medicine

## 2020-03-19 DIAGNOSIS — E1165 Type 2 diabetes mellitus with hyperglycemia: Secondary | ICD-10-CM

## 2020-03-19 MED FILL — LISINOPRIL-HYDROCHLOROTHIAZ: 20-25 | 30 days supply | Qty: 30 | Fill #3

## 2020-03-19 MED FILL — $LANTUS SOLOSTAR 100 UNITS/: 100 | 32 days supply | Qty: 30 | Fill #0

## 2020-03-19 MED FILL — VICTOZA 18 MG/3 ML INJECT P: 18 | 30 days supply | Qty: 9 | Fill #3

## 2020-04-13 ENCOUNTER — Other Ambulatory Visit: Payer: Self-pay

## 2020-04-13 ENCOUNTER — Encounter (HOSPITAL_BASED_OUTPATIENT_CLINIC_OR_DEPARTMENT_OTHER): Payer: Self-pay | Admitting: *Deleted

## 2020-04-13 DIAGNOSIS — F17210 Nicotine dependence, cigarettes, uncomplicated: Secondary | ICD-10-CM | POA: Insufficient documentation

## 2020-04-13 DIAGNOSIS — Z85841 Personal history of malignant neoplasm of brain: Secondary | ICD-10-CM | POA: Insufficient documentation

## 2020-04-13 DIAGNOSIS — Z7951 Long term (current) use of inhaled steroids: Secondary | ICD-10-CM | POA: Insufficient documentation

## 2020-04-13 DIAGNOSIS — J45909 Unspecified asthma, uncomplicated: Secondary | ICD-10-CM | POA: Insufficient documentation

## 2020-04-13 DIAGNOSIS — Z794 Long term (current) use of insulin: Secondary | ICD-10-CM | POA: Insufficient documentation

## 2020-04-13 DIAGNOSIS — Z79899 Other long term (current) drug therapy: Secondary | ICD-10-CM | POA: Insufficient documentation

## 2020-04-13 DIAGNOSIS — L0231 Cutaneous abscess of buttock: Secondary | ICD-10-CM | POA: Insufficient documentation

## 2020-04-13 DIAGNOSIS — I10 Essential (primary) hypertension: Secondary | ICD-10-CM | POA: Insufficient documentation

## 2020-04-13 DIAGNOSIS — E1140 Type 2 diabetes mellitus with diabetic neuropathy, unspecified: Secondary | ICD-10-CM | POA: Insufficient documentation

## 2020-04-13 DIAGNOSIS — Z7982 Long term (current) use of aspirin: Secondary | ICD-10-CM | POA: Insufficient documentation

## 2020-04-13 NOTE — ED Triage Notes (Signed)
C/o rectal pain x 3 days hx Hemorids

## 2020-04-14 ENCOUNTER — Emergency Department (HOSPITAL_BASED_OUTPATIENT_CLINIC_OR_DEPARTMENT_OTHER)
Admission: EM | Admit: 2020-04-14 | Discharge: 2020-04-14 | Disposition: A | Discharge status: Home / Self Care | Payer: No Typology Code available for payment source | Attending: Emergency Medicine | Admitting: Emergency Medicine

## 2020-04-14 DIAGNOSIS — L0231 Cutaneous abscess of buttock: Secondary | ICD-10-CM

## 2020-04-14 MED ORDER — HYDROCODONE-ACETAMINOPHEN 5-325 MG PO TABS: 1.0000 | ORAL_TABLET | ORAL | 0 refills | Status: DC | PRN

## 2020-04-14 MED ORDER — LIDOCAINE-EPINEPHRINE (PF) 2 %-1:200000 IJ SOLN: 10.0000 mL | Freq: Once | INTRAMUSCULAR | Status: AC

## 2020-04-14 MED ORDER — LIDOCAINE-EPINEPHRINE (PF) 2 %-1:200000 IJ SOLN
INTRAMUSCULAR | Status: AC
  Administered 2020-04-14: 10 mL via INTRADERMAL
  Filled 2020-04-14: qty 20

## 2020-04-14 NOTE — ED Provider Notes (Signed)
[place] DEPT [facility] Provider Note: Georgena Spurling, MD, FACEP  CSN: [id number] MRN: [id number] ARRIVAL: 04/13/20 at [place]: [place]  Hemorrhoids   HISTORY OF PRESENT ILLNESS  04/14/20 1:33 AM Barbara Wang is a 54 y.o. female with about 3 weeks of "hemorrhoids" by which she means perianal irritation.  For the past 3 days she has noticed a knot to the right of her anus.  It is tender and she rates the pain as a 10 out of 10.  Pain is worse with movement, sitting or bowel movements.  There has been purulent drainage from it.  She denies any bleeding.   Past Medical History:  Diagnosis Date  . Asthma   . Brain tumor ([place])   . Cellulitis 06/2015   rt hand   . Depression   . History of hiatal hernia    "it went away on it's own"  . Hyperlipemia   . Hypertension   . Neuropathy     "feet & hands "  . Obesity   . Pancreatitis   . Type II diabetes mellitus (HCC)    insulin dependent     Past Surgical History:  Procedure Laterality Date  . ABDOMINAL HYSTERECTOMY    . BREAST CYST EXCISION Right 10+ yrs ago   abscess removed  . [place]; 1987  . CHOLECYSTECTOMY  05/11/2012   Procedure: LAPAROSCOPIC CHOLECYSTECTOMY WITH INTRAOPERATIVE CHOLANGIOGRAM;  Surgeon: Gayland Curry, MD,FACS;  Location: [place];  Service: General;  Laterality: N/A;  . SHOULDER ARTHROSCOPY W/ ROTATOR CUFF REPAIR Right   . TUBAL LIGATION  10/25/1999   Archie Endo 01/11/2011    Family History  Problem Relation Age of Onset  . Stroke Mother   . Breast cancer Mother 64  . Diabetes Father   . Diabetes Sister   . Breast cancer Maternal Aunt   . Breast cancer Maternal Aunt   . Breast cancer Maternal Grandmother     Social History   Tobacco Use  . Smoking status: Current Every Day Smoker    Packs/day: 1.00    Years: 40.00    Pack years: 40.00    Types: Cigarettes  . Smokeless tobacco: Never Used  Vaping Use  . Vaping Use: Never used  Substance Use Topics    . Alcohol use: No  . Drug use: No    Prior to Admission medications   Medication Sig Start Date End Date Taking? Authorizing Provider  acetaminophen (TYLENOL) 500 MG tablet Take 1,000 mg by mouth every 6 (six) hours as needed for moderate pain.    [provider]  aspirin 81 MG EC tablet Take 1 tablet (81 mg total) by mouth daily. 12/12/19   Gildardo Pounds, NP  atorvastatin (LIPITOR) 40 MG tablet Take 1 tablet (40 mg total) by mouth daily at 6 PM. 12/12/19   Gildardo Pounds, NP  Blood Glucose Monitoring Suppl (TRUE METRIX GO GLUCOSE METER) w/Device KIT 1 each by Does not apply route every 8 (eight) hours as needed. 09/18/16   Maren Reamer, MD  cephALEXin (KEFLEX) 500 MG capsule 1 cap po bid x 7 days 01/18/20   Muthersbaugh, Jarrett Soho, PA-C  cetirizine (ZYRTEC) 10 MG tablet Take 1 tablet (10 mg total) by mouth daily. Patient taking differently: Take 10 mg by mouth daily as needed for allergies.  02/15/18   Gildardo Pounds, NP  fluconazole (DIFLUCAN) 150 MG tablet Take 1 tablet (150 mg total) by mouth every 3 (  three) days. 01/18/20   Muthersbaugh, Jarrett Soho, PA-C  fluticasone (FLONASE) 50 MCG/ACT nasal spray Place 2 sprays into both nostrils daily. 12/12/19   Gildardo Pounds, NP  gabapentin (NEURONTIN) 600 MG tablet Take 1 tablet (600 mg total) by mouth 3 (three) times daily. 12/12/19   Gildardo Pounds, NP  glucose blood test strip Use as instructed 12/12/19   Gildardo Pounds, NP  HYDROcodone-acetaminophen (NORCO) 5-325 MG tablet Take 1 tablet by mouth every 4 (four) hours as needed for severe pain. 04/14/20   Isiaih Hollenbach, MD  ibuprofen (ADVIL) 200 MG tablet Take 4 tablets (800 mg total) by mouth every 6 (six) hours as needed for moderate pain. 07/22/19   Elsie Stain, MD  insulin aspart (NOVOLOG FLEXPEN) 100 UNIT/ML FlexPen Inject 12 Units into the skin 3 (three) times daily with meals. 12/12/19 03/11/20  Gildardo Pounds, NP  Insulin Pen Needle (B-D UF III MINI PEN NEEDLES) 31G X 5 MM  MISC Inject into the skin weekly. 12/12/19   Gildardo Pounds, NP  LANTUS SOLOSTAR 100 UNIT/ML Solostar Pen INJECT 46 UNITS INTO THE SKIN 2 (TWO) TIMES DAILY. 03/19/20   Gildardo Pounds, NP  liraglutide (VICTOZA) 18 MG/3ML SOPN SubQ:  Inject 0.6 mg once daily into the skin. Week 2: increase to 1.2 mg once daily; week 3: increase to 1.8 mg once daily. You should only increase weekly if there are no frequent hypoglycemic readings less than 90. 12/22/19   Gildardo Pounds, NP  lisinopril-hydrochlorothiazide (ZESTORETIC) 20-25 MG tablet Take 1 tablet by mouth daily. 12/12/19 03/11/20  Gildardo Pounds, NP  loratadine (CLARITIN) 10 MG tablet Take by mouth daily as needed for allergies.  03/22/14   [provider]  PROAIR HFA 108 (90 Base) MCG/ACT inhaler Inhale 1-2 puffs into the lungs every 6 (six) hours as needed for wheezing or shortness of breath. 12/12/19   Gildardo Pounds, NP  QUEtiapine (SEROQUEL) 50 MG tablet Take 1-2 tablets (50-100 mg total) by mouth at bedtime. 12/12/19 01/11/20  Gildardo Pounds, NP  sodium chloride (OCEAN) 0.65 % SOLN nasal spray Place 1 spray into both nostrils every 4 (four) hours as needed for congestion. 01/02/19   Arrien, Jimmy Picket, MD  TRUEplus Lancets 26G MISC 1 each by Does not apply route every 8 (eight) hours as needed. 12/12/19   Gildardo Pounds, NP  insulin lispro (HUMALOG) 100 UNIT/ML KwikPen Inject 0.12 mLs (12 Units total) into the skin 3 (three) times daily. 07/22/19 12/12/19  Elsie Stain, MD    Allergies Penicillins, Azithromycin, Erythromycin base, Sulfa antibiotics, and Tramadol   REVIEW OF SYSTEMS  Negative except as noted here or in the History of Present Illness.   PHYSICAL EXAMINATION  Initial Vital Signs Blood pressure (!) 142/70, pulse (!) 117, temperature 98.2 F (36.8 C), temperature source Oral, resp. rate 18, height _0  (1.626 m), weight 89.4 kg, SpO2 100 %.  Examination General: Well-developed, well-nourished female in no  acute distress; appearance consistent with age of record HENT: normocephalic; atraumatic Eyes: Normal appearance Neck: supple Heart: regular rate and rhythm Lungs: clear to auscultation bilaterally Abdomen: soft; nondistended; nontender; bowel sounds present Rectal: Draining abscess to the right of the anus with surrounding induration of the buttock Extremities: No deformity; full range of motion; pulses normal Neurologic: Awake, alert and oriented; motor function intact in all extremities and symmetric; no facial droop Skin: Warm and dry Psychiatric: Normal mood and affect   RESULTS  Summary of  this visit's results, reviewed and interpreted by myself:   EKG Interpretation  Date/Time:    Ventricular Rate:    PR Interval:    QRS Duration:   QT Interval:    QTC Calculation:   R Axis:     Text Interpretation:        Laboratory Studies: No results found for this or any previous visit (from the past 24 hour(s)). Imaging Studies: No results found.  ED COURSE and MDM  Nursing notes, initial and subsequent vitals signs, including pulse oximetry, reviewed and interpreted by myself.  Vitals:   04/13/20 1902 04/13/20 1903 04/13/20 2225  BP:  (!) 142/89 (!) 142/70  Pulse:  (!) 102 (!) 117  Resp:  18 18  Temp:  98.2 F (36.8 C)   TempSrc:  Oral   SpO2:  100% 100%  Weight: 89.4 kg    Height: _0  (1.626 m)     Medications  lidocaine-EPINEPHrine (XYLOCAINE W/EPI) 2 %-1:200000 (PF) injection 10 mL (10 mLs Intradermal Given by Other 04/14/20 0147)    Patient advised to remove packing in 3 days if symptoms are improving or to return to the ED if symptoms are worsening.  PROCEDURES  Procedures  INCISION AND DRAINAGE Performed by: Karen Chafe Diella Gillingham Consent: Verbal consent obtained. Risks and benefits: risks, benefits and alternatives were discussed Type: abscess  Body area: Right medial buttock  Anesthesia: local infiltration  Incision was made with a scalpel.  Local  anesthetic: lidocaine 2% with epinephrine  Anesthetic total: 3 ml  Complexity: complex Blunt dissection to break up loculations  Drainage: purulent  Drainage amount: Copious  Packing material: 1/4 in iodoform gauze  Patient tolerance: Patient tolerated the procedure well with no immediate complications.   ED DIAGNOSES     ICD-10-CM   1. Abscess of right buttock  L02.31        Patti Shorb, Jenny Reichmann, MD 04/14/20 [phone]

## 2020-04-14 NOTE — ED Notes (Signed)
Patient cleaned and clean dry 4x4 gauze applied to area and patient given mesh panties and peripads.

## 2020-05-24 MED FILL — $LANTUS SOLOSTAR 100 UNITS/: 100 | 32 days supply | Qty: 30 | Fill #1

## 2020-07-27 MED FILL — LISINOPRIL-HYDROCHLOROTHIAZ: 20-25 | 30 days supply | Qty: 30 | Fill #1

## 2020-07-27 MED FILL — $LANTUS SOLOSTAR 100 UNITS/: 100 | 32 days supply | Qty: 30 | Fill #2

## 2020-07-27 MED FILL — VICTOZA 18 MG/3 ML INJECT P: 18 | 30 days supply | Qty: 9 | Fill #4

## 2020-10-05 MED FILL — !HUMALOG 100 UNITS/ML KWIKP: 100 | 33 days supply | Qty: 12 | Fill #0

## 2020-10-05 MED FILL — LISINOPRIL-HYDROCHLOROTHIAZ: 20-25 | 30 days supply | Qty: 30 | Fill #2

## 2020-10-05 MED FILL — !VICTOZA 18MG/3ML INJECT: 18 | 30 days supply | Qty: 9 | Fill #5

## 2020-10-05 MED FILL — $LANTUS SOLOSTAR 100 UNITS/: 100 | 32 days supply | Qty: 30 | Fill #0

## 2020-12-30 ENCOUNTER — Other Ambulatory Visit: Payer: Self-pay

## 2021-01-13 ENCOUNTER — Other Ambulatory Visit: Payer: Self-pay

## 2021-01-13 ENCOUNTER — Other Ambulatory Visit: Payer: Self-pay | Admitting: Nurse Practitioner

## 2021-01-13 DIAGNOSIS — I10 Essential (primary) hypertension: Secondary | ICD-10-CM

## 2021-01-13 MED FILL — Insulin Glargine Soln Pen-Injector 100 Unit/ML: SUBCUTANEOUS | 33 days supply | Qty: 30 | Fill #0 | Status: CN

## 2021-01-13 NOTE — Telephone Encounter (Signed)
Patient called and advised she will need an appointment due to no visit in 1 year, patient verbalized understanding and says she will need her medication prior to the scheduled appointment 03/09/21 at 1010 for CPE. I advised I will send this to [facility] for approval.

## 2021-01-13 NOTE — Telephone Encounter (Signed)
Requested medication (s) are due for refill today: Yes  Requested medication (s) are on the active medication list: Yes  Last refill: 12/02/19  Future visit scheduled: Yes  Notes to clinic:  Unable to refill per protocol, Rx expired.      Requested Prescriptions  Pending Prescriptions Disp Refills   liraglutide (VICTOZA) 18 MG/3ML SOPN 9 mL 6    Sig: INJECT 1.8 MG INTO THE SKIN ONCE DAILY.      Endocrinology:  Diabetes - GLP-1 Receptor Agonists Failed - 01/13/2021 11:36 AM      Failed - HBA1C is between 0 and 7.9 and within 180 days    Hgb A1c MFr Bld  Date Value Ref Range Status  12/18/2019 9.4 (H) 4.8 - 5.6 % Final    Comment:             Prediabetes: 5.7 - 6.4          Diabetes: >6.4          Glycemic control for adults with diabetes: <7.0           Failed - Valid encounter within last 6 months    Recent Outpatient Visits           1 year ago Uncontrolled type 2 diabetes mellitus with hyperglycemia [facility])   [place], Vernia Buff, NP   1 year ago Need for influenza vaccination   [place], Jarome Matin, RPH-CPP   1 year ago Uncontrolled type 2 diabetes mellitus with hyperglycemia [facility])   [place] Elsie Stain, MD   1 year ago Uncontrolled type 2 diabetes mellitus with hyperglycemia [facility])   [place], Vernia Buff, NP   1 year ago Insulin dependent diabetes mellitus [facility])   [place], RPH-CPP       Future Appointments             In 1 month Gildardo Pounds, NP [place]               lisinopril-hydrochlorothiazide (ZESTORETIC) 20-25 MG tablet 90 tablet 1    Sig: TAKE 1 TABLET BY MOUTH DAILY.      Cardiovascular:  ACEI + Diuretic Combos Failed - 01/13/2021 11:36 AM      Failed - Na in normal range and within 180 days     Sodium  Date Value Ref Range Status  01/17/2020 139 135 - 145 mmol/L Final  12/18/2019 144 134 - 144 mmol/L Final          Failed - K in normal range and within 180 days    Potassium  Date Value Ref Range Status  01/17/2020 4.0 3.5 - 5.1 mmol/L Final          Failed - Cr in normal range and within 180 days    Creat  Date Value Ref Range Status  06/11/2017 0.88 0.50 - 1.05 mg/dL Final    Comment:    For patients >59 years of age, the reference limit for Creatinine is approximately 13% higher for people identified as African-American. .    Creatinine, Ser  Date Value Ref Range Status  01/17/2020 0.90 0.44 - 1.00 mg/dL Final   Creatinine, Urine  Date Value Ref Range Status  02/02/2016 128 20 - 320 mg/dL Final  Failed - Ca in normal range and within 180 days    Calcium  Date Value Ref Range Status  01/17/2020 9.1 8.9 - 10.3 mg/dL Final   Calcium, Ion  Date Value Ref Range Status  01/17/2020 1.10 (L) 1.15 - 1.40 mmol/L Final          Failed - Last BP in normal range    BP Readings from Last 1 Encounters:  04/14/20 (!) 145/87          Failed - Valid encounter within last 6 months    Recent Outpatient Visits           1 year ago Uncontrolled type 2 diabetes mellitus with hyperglycemia [facility])   [place], Vernia Buff, NP   1 year ago Need for influenza vaccination   [place], Jarome Matin, RPH-CPP   1 year ago Uncontrolled type 2 diabetes mellitus with hyperglycemia [facility])   [place] Elsie Stain, MD   1 year ago Uncontrolled type 2 diabetes mellitus with hyperglycemia [facility])   [place], Vernia Buff, NP   1 year ago Insulin dependent diabetes mellitus [facility])   [place], RPH-CPP       Future Appointments             In 1 month Gildardo Pounds,  NP [place] - Patient is not pregnant        insulin lispro (HUMALOG KWIKPEN) 100 UNIT/ML KwikPen 30 mL 3    Sig: INJECT 12 UNITS INTO THE SKIN 3 (THREE) TIMES DAILY WITH MEALS.      Endocrinology:  Diabetes - Insulins Failed - 01/13/2021 11:36 AM      Failed - HBA1C is between 0 and 7.9 and within 180 days    Hgb A1c MFr Bld  Date Value Ref Range Status  12/18/2019 9.4 (H) 4.8 - 5.6 % Final    Comment:             Prediabetes: 5.7 - 6.4          Diabetes: >6.4          Glycemic control for adults with diabetes: <7.0           Failed - Valid encounter within last 6 months    Recent Outpatient Visits           1 year ago Uncontrolled type 2 diabetes mellitus with hyperglycemia [facility])   [place], Vernia Buff, NP   1 year ago Need for influenza vaccination   [place], Stephen L, RPH-CPP   1 year ago Uncontrolled type 2 diabetes mellitus with hyperglycemia [facility])   [place] Elsie Stain, MD   1 year ago Uncontrolled type 2 diabetes mellitus with hyperglycemia [facility])   [place] Gildardo Pounds, NP   1 year ago Insulin dependent diabetes mellitus [facility])   [place], RPH-CPP       Future Appointments             In 1 month Gildardo Pounds, NP [place]

## 2021-01-18 ENCOUNTER — Other Ambulatory Visit: Payer: Self-pay

## 2021-01-18 ENCOUNTER — Other Ambulatory Visit: Payer: Self-pay | Admitting: Nurse Practitioner

## 2021-01-18 DIAGNOSIS — I10 Essential (primary) hypertension: Secondary | ICD-10-CM

## 2021-01-18 DIAGNOSIS — E1165 Type 2 diabetes mellitus with hyperglycemia: Secondary | ICD-10-CM

## 2021-01-18 NOTE — Telephone Encounter (Signed)
Patient has not had labs done since 12/2019. Refill authorization will have to come from PCP.

## 2021-01-18 NOTE — Telephone Encounter (Signed)
Patient called in requesting refills for lantus, her fast acting insulin, lisinopril, and HCTZ. Patient was last seen 11/2019 and has upcoming appointment in July. Please advise if refill appropriate.

## 2021-01-19 ENCOUNTER — Telehealth: Payer: Self-pay | Admitting: Nurse Practitioner

## 2021-01-19 ENCOUNTER — Other Ambulatory Visit: Payer: Self-pay

## 2021-01-19 NOTE — Telephone Encounter (Signed)
Pt would like a refill on these medications  insulin lispro (HUMALOG) 100 UNIT/ML KwikPen [[id number]] ENDED  liraglutide (VICTOZA) 18 MG/3ML SOPN [[id number]] ENDED  lisinopril-hydrochlorothiazide (ZESTORETIC) 20-25 MG tablet [[id number]] ENDED   insulin glargine (LANTUS SOLOSTAR) 100 UNIT/ML Solostar Pen [[id number]]    I made pt aware that three of those meds have ended. Please follow up with pt regarding this matter.

## 2021-01-20 ENCOUNTER — Other Ambulatory Visit: Payer: Self-pay

## 2021-01-25 ENCOUNTER — Other Ambulatory Visit: Payer: Self-pay

## 2021-01-31 ENCOUNTER — Other Ambulatory Visit: Payer: Self-pay

## 2021-02-09 ENCOUNTER — Other Ambulatory Visit: Payer: Self-pay

## 2021-02-09 MED FILL — Insulin Glargine Soln Pen-Injector 100 Unit/ML: SUBCUTANEOUS | 33 days supply | Qty: 30 | Fill #0 | Status: AC

## 2021-02-22 ENCOUNTER — Other Ambulatory Visit: Payer: Self-pay

## 2021-03-09 ENCOUNTER — Encounter: Payer: No Typology Code available for payment source | Admitting: Nurse Practitioner

## 2021-04-27 DIAGNOSIS — I10 Essential (primary) hypertension: Secondary | ICD-10-CM | POA: Insufficient documentation

## 2021-04-27 DIAGNOSIS — E1165 Type 2 diabetes mellitus with hyperglycemia: Secondary | ICD-10-CM | POA: Insufficient documentation

## 2021-05-03 ENCOUNTER — Other Ambulatory Visit: Payer: Self-pay

## 2021-05-09 ENCOUNTER — Other Ambulatory Visit: Payer: Self-pay

## 2021-05-24 ENCOUNTER — Other Ambulatory Visit: Payer: Self-pay

## 2021-05-24 ENCOUNTER — Encounter: Payer: Self-pay | Admitting: Nurse Practitioner

## 2021-05-24 ENCOUNTER — Other Ambulatory Visit (HOSPITAL_COMMUNITY)
Admission: RE | Admit: 2021-05-24 | Discharge: 2021-05-24 | Disposition: A | Discharge status: Home / Self Care | Payer: No Typology Code available for payment source | Source: Ambulatory Visit | Attending: Nurse Practitioner | Admitting: Nurse Practitioner

## 2021-05-24 ENCOUNTER — Ambulatory Visit: Payer: Self-pay | Attending: Nurse Practitioner | Admitting: Nurse Practitioner

## 2021-05-24 ENCOUNTER — Other Ambulatory Visit: Payer: Self-pay | Admitting: Pharmacist

## 2021-05-24 VITALS — BP 151/89 | HR 78 | Ht 64.0 in | Wt 176.0 lb

## 2021-05-24 DIAGNOSIS — G4700 Insomnia, unspecified: Secondary | ICD-10-CM

## 2021-05-24 DIAGNOSIS — I10 Essential (primary) hypertension: Secondary | ICD-10-CM

## 2021-05-24 DIAGNOSIS — N3001 Acute cystitis with hematuria: Secondary | ICD-10-CM

## 2021-05-24 DIAGNOSIS — Z1231 Encounter for screening mammogram for malignant neoplasm of breast: Secondary | ICD-10-CM

## 2021-05-24 DIAGNOSIS — E1165 Type 2 diabetes mellitus with hyperglycemia: Secondary | ICD-10-CM

## 2021-05-24 DIAGNOSIS — J4530 Mild persistent asthma, uncomplicated: Secondary | ICD-10-CM

## 2021-05-24 DIAGNOSIS — E782 Mixed hyperlipidemia: Secondary | ICD-10-CM

## 2021-05-24 DIAGNOSIS — R399 Unspecified symptoms and signs involving the genitourinary system: Secondary | ICD-10-CM

## 2021-05-24 DIAGNOSIS — N761 Subacute and chronic vaginitis: Secondary | ICD-10-CM | POA: Insufficient documentation

## 2021-05-24 DIAGNOSIS — Z114 Encounter for screening for human immunodeficiency virus [HIV]: Secondary | ICD-10-CM

## 2021-05-24 LAB — GLUCOSE, POCT (MANUAL RESULT ENTRY): POC Glucose: 322 mg/dl — AB (ref 70–99)

## 2021-05-24 LAB — POCT GLYCOSYLATED HEMOGLOBIN (HGB A1C): Hemoglobin A1C: 13.4 % — AB (ref 4.0–5.6)

## 2021-05-24 MED ORDER — GLUCOSE BLOOD VI STRP
ORAL_STRIP | 12 refills | Status: DC
  Filled 2021-05-24: qty 100, 90d supply, fill #0

## 2021-05-24 MED ORDER — ALBUTEROL SULFATE HFA 108 (90 BASE) MCG/ACT IN AERS
1.0000 | INHALATION_SPRAY | Freq: Four times a day (QID) | RESPIRATORY_TRACT | 1 refills | Status: DC | PRN
Start: 2021-05-24 — End: 2022-05-22
  Filled 2021-05-24: qty 8.5, 25d supply, fill #0

## 2021-05-24 MED ORDER — LANTUS SOLOSTAR 100 UNIT/ML ~~LOC~~ SOPN
46.0000 [IU] | PEN_INJECTOR | Freq: Two times a day (BID) | SUBCUTANEOUS | 2 refills | Status: DC
  Filled 2021-05-24: qty 30, 33d supply, fill #0

## 2021-05-24 MED ORDER — INSULIN LISPRO (1 UNIT DIAL) 100 UNIT/ML (KWIKPEN)
PEN_INJECTOR | SUBCUTANEOUS | 3 refills | Status: DC
  Filled 2021-05-24 – 2021-12-26 (×3): qty 30, 83d supply, fill #0

## 2021-05-24 MED ORDER — BD PEN NEEDLE MINI U/F 31G X 5 MM MISC
6 refills | Status: DC
  Filled 2021-05-24: qty 100, 90d supply, fill #0
  Filled 2021-12-01: qty 100, 30d supply, fill #0

## 2021-05-24 MED ORDER — QUETIAPINE FUMARATE 50 MG PO TABS
50.0000 mg | ORAL_TABLET | Freq: Every day | ORAL | 3 refills | Status: DC
Start: 2021-05-24 — End: 2021-12-06
  Filled 2021-05-24: qty 60, 30d supply, fill #0

## 2021-05-24 MED ORDER — LISINOPRIL-HYDROCHLOROTHIAZIDE 20-25 MG PO TABS
1.0000 | ORAL_TABLET | Freq: Every day | ORAL | 1 refills | Status: DC
  Filled 2021-05-24: qty 30, 30d supply, fill #0

## 2021-05-24 MED ORDER — TRUEPLUS LANCETS 28G MISC
1.0000 | Freq: Three times a day (TID) | 12 refills | Status: DC | PRN
  Filled 2021-05-24: qty 100, 30d supply, fill #0

## 2021-05-24 MED ORDER — ATORVASTATIN CALCIUM 40 MG PO TABS
40.0000 mg | ORAL_TABLET | Freq: Every day | ORAL | 3 refills | Status: DC
  Filled 2021-05-24: qty 30, 30d supply, fill #0

## 2021-05-24 MED ORDER — LIRAGLUTIDE 18 MG/3ML ~~LOC~~ SOPN
PEN_INJECTOR | SUBCUTANEOUS | 6 refills | Status: DC
  Filled 2021-05-24: qty 9, 30d supply, fill #0

## 2021-05-24 MED ORDER — GLUCOSE BLOOD VI STRP
ORAL_STRIP | 2 refills | Status: DC
  Filled 2021-05-24: qty 100, 25d supply, fill #0

## 2021-05-24 MED ORDER — GABAPENTIN 600 MG PO TABS
600.0000 mg | ORAL_TABLET | Freq: Three times a day (TID) | ORAL | 3 refills | Status: DC
  Filled 2021-05-24: qty 90, 30d supply, fill #0

## 2021-05-24 NOTE — Progress Notes (Signed)
Assessment & Plan:  Barbara Wang was seen today for hypertension.  Diagnoses and all orders for this visit:  Uncontrolled type 2 diabetes mellitus with hyperglycemia (HCC) -     insulin glargine (LANTUS SOLOSTAR) 100 UNIT/ML Solostar Pen; INJECT 46 UNITS INTO THE SKIN 2 (TWO) TIMES DAILY. -     insulin lispro (HUMALOG) 100 UNIT/ML KwikPen; INJECT 12 UNITS INTO THE SKIN 3 (THREE) TIMES DAILY WITH MEALS. -     POCT glucose (manual entry) -     POCT glycosylated hemoglobin (Hb A1C) -     liraglutide (VICTOZA) 18 MG/3ML SOPN; INJECT 1.8 MG INTO THE SKIN ONCE DAILY. -     Insulin Pen Needle (B-D UF III MINI PEN NEEDLES) 31G X 5 MM MISC; Inject into the skin weekly. -     gabapentin (NEURONTIN) 600 MG tablet; Take 1 tablet (600 mg total) by mouth 3 (three) times daily. -     TRUEplus Lancets 28G MISC; 1 each by Does not apply route every 8 (eight) hours as needed. -     Discontinue: glucose blood test strip; Use as instructed -     Microalbumin / creatinine urine ratio Continue blood sugar control as discussed in office today, low carbohydrate diet, and regular physical exercise as tolerated, 150 minutes per week (30 min each day, 5 days per week, or 50 min 3 days per week). Keep blood sugar logs with fasting goal of 90-130 mg/dl, post prandial (after you eat) less than 180.  For Hypoglycemia: BS <60 and Hyperglycemia BS >400; contact the clinic ASAP. Annual eye exams and foot exams are recommended.   Essential hypertension -     lisinopril-hydrochlorothiazide (ZESTORETIC) 20-25 MG tablet; TAKE 1 TABLET BY MOUTH DAILY. -     CMP14+EGFR Continue all antihypertensives as prescribed.  Remember to bring in your blood pressure log with you for your follow up appointment.  DASH/Mediterranean Diets are healthier choices for HTN.    Mixed hyperlipidemia -     atorvastatin (LIPITOR) 40 MG tablet; Take 1 tablet (40 mg total) by mouth daily at 6 PM. INSTRUCTIONS: Work on a low fat, heart healthy diet  and participate in regular aerobic exercise program by working out at least 150 minutes per week; 5 days a week-30 minutes per day. Avoid red meat/beef/steak,  fried foods. junk foods, sodas, sugary drinks, unhealthy snacking, alcohol and smoking.  Drink at least 80 oz of water per day and monitor your carbohydrate intake daily.     Insomnia, unspecified type -     QUEtiapine (SEROQUEL) 50 MG tablet; Take 1-2 tablets (50-100 mg total) by mouth at bedtime.  Mild persistent asthma without complication -     albuterol (VENTOLIN HFA) 108 (90 Base) MCG/ACT inhaler; Inhale 1-2 puffs into the lungs every 6 (six) hours as needed for wheezing or shortness of breath.  Breast cancer screening by mammogram -     MS DIGITAL SCREENING TOMO BILATERAL; Future  Chronic vaginitis -     Cervicovaginal ancillary only  Acute cystitis with hematuria -     POCT URINALYSIS DIP (CLINITEK) -     Urinalysis, Complete -     Microscopic Examination -     nitrofurantoin, macrocrystal-monohydrate, (MACROBID) 100 MG capsule; Take 1 capsule (100 mg total) by mouth 2 (two) times daily for 5 days. -     fluconazole (DIFLUCAN) 150 MG tablet; Take 1 tablet (150 mg total) by mouth once for 1 dose. Take after completing antibiotic  Encounter for  screening for HIV -     HIV antibody (with reflex)   Patient has been counseled on age-appropriate routine health concerns for screening and prevention. These are reviewed and up-to-date. Referrals have been placed accordingly. Immunizations are up-to-date or declined.    Subjective:   Chief Complaint  Patient presents with   Hypertension   HPI Barbara Wang 55 y.o. female presents to office today for follow up to her chronic health conditions.  She has a past medical history of Asthma, Brain tumor ([place]), Cellulitis (06/2015), Depression, History of hiatal hernia, Hyperlipemia, Hypertension, Neuropathy, Obesity, Pancreatitis, and Type II diabetes mellitus ([place]).    Notes increased stress and grief due to her mother passing this past weekend.   She has been out of her medications for quite some time. Will refill today  DM2 Poorly controlled. She has been out of her medications however her diabetes has not been controlled in some time.  We will resume Victoza 1.8 mg daily, Lantus 26 units twice daily and Humalog 40 units 12 TID. LDL near goal with atorvastatin 40 mg daily.  Lab Results  Component Value Date   HGBA1C 13.4 (A) 05/24/2021    Lab Results  Component Value Date   LDLCALC 74 12/18/2019      HTN Poorly controlled. She is not taking her medications as prescribed. Currently prescribed zestoretic 20-25 mg daily. Denies chest pain, shortness of breath, palpitations, lightheadedness, dizziness, headaches or BLE edema.   BP Readings from Last 3 Encounters:  05/24/21 (!) 151/89  04/14/20 (!) 145/87  01/18/20 140/89     UTI symptoms Endorses lower pelvic pressure and dysuria. Ongoing for a few weeks.   Review of Systems  Constitutional:  Negative for fever, malaise/fatigue and weight loss.  HENT: Negative.  Negative for nosebleeds.   Eyes: Negative.  Negative for blurred vision, double vision and photophobia.  Respiratory: Negative.  Negative for cough and shortness of breath.   Cardiovascular: Negative.  Negative for chest pain, palpitations and leg swelling.  Gastrointestinal: Negative.  Negative for heartburn, nausea and vomiting.  Genitourinary:  Positive for dysuria, flank pain and frequency.  Musculoskeletal:  Negative for myalgias.  Neurological: Negative.  Negative for dizziness, focal weakness, seizures and headaches.  Psychiatric/Behavioral:  Negative for suicidal ideas. The patient has insomnia.    Past Medical History:  Diagnosis Date   Asthma    Brain tumor ([place])    Cellulitis 06/2015   rt hand    Depression    History of hiatal hernia    "it went away on it's own"   Hyperlipemia    Hypertension    Neuropathy      "feet & hands "   Obesity    Pancreatitis    Type II diabetes mellitus ([place])    insulin dependent     Past Surgical History:  Procedure Laterality Date   ABDOMINAL HYSTERECTOMY     BREAST CYST EXCISION Right 10+ yrs ago   abscess removed   [place]; 1987   CHOLECYSTECTOMY  05/11/2012   Procedure: LAPAROSCOPIC CHOLECYSTECTOMY WITH INTRAOPERATIVE CHOLANGIOGRAM;  Surgeon: Gayland Curry, MD,FACS;  Location: [facility];  Service: General;  Laterality: N/A;   SHOULDER ARTHROSCOPY W/ ROTATOR CUFF REPAIR Right    TUBAL LIGATION  10/25/1999   Archie Endo 01/11/2011    Family History  Problem Relation Age of Onset   Stroke Mother    Breast cancer Mother 36   Diabetes Father    Diabetes Sister  Breast cancer Maternal Aunt    Breast cancer Maternal Aunt    Breast cancer Maternal Grandmother     Social History Reviewed with no changes to be made today.   Outpatient Medications Prior to Visit  Medication Sig Dispense Refill   acetaminophen (TYLENOL) 500 MG tablet Take 1,000 mg by mouth every 6 (six) hours as needed for moderate pain.     aspirin 81 MG EC tablet Take 1 tablet (81 mg total) by mouth daily. 90 tablet 3   Blood Glucose Monitoring Suppl (TRUE METRIX GO GLUCOSE METER) w/Device KIT 1 each by Does not apply route every 8 (eight) hours as needed. 1 kit 0   fluticasone (FLONASE) 50 MCG/ACT nasal spray Place 2 sprays into both nostrils daily. 16 g 1   ibuprofen (ADVIL) 200 MG tablet Take 4 tablets (800 mg total) by mouth every 6 (six) hours as needed for moderate pain. 30 tablet 1   sodium chloride (OCEAN) 0.65 % SOLN nasal spray Place 1 spray into both nostrils every 4 (four) hours as needed for congestion. 1 Bottle 0   atorvastatin (LIPITOR) 40 MG tablet Take 1 tablet (40 mg total) by mouth daily at 6 PM. 90 tablet 3   cephALEXin (KEFLEX) 500 MG capsule 1 cap po bid x 7 days 20 capsule 0   cetirizine (ZYRTEC) 10 MG tablet Take 1 tablet (10 mg total) by mouth daily.  (Patient taking differently: Take 10 mg by mouth daily as needed for allergies.) 90 tablet 11   fluconazole (DIFLUCAN) 150 MG tablet Take 1 tablet (150 mg total) by mouth every 3 (three) days. 2 tablet 0   gabapentin (NEURONTIN) 600 MG tablet Take 1 tablet (600 mg total) by mouth 3 (three) times daily. 90 tablet 3   glucose blood test strip Use as instructed 100 each 12   insulin glargine (LANTUS SOLOSTAR) 100 UNIT/ML Solostar Pen INJECT 46 UNITS INTO THE SKIN 2 (TWO) TIMES DAILY. 30 mL 2   Insulin Pen Needle (B-D UF III MINI PEN NEEDLES) 31G X 5 MM MISC Inject into the skin weekly. 100 each 6   PROAIR HFA 108 (90 Base) MCG/ACT inhaler Inhale 1-2 puffs into the lungs every 6 (six) hours as needed for wheezing or shortness of breath. 18 g 1   TRUEplus Lancets 26G MISC 1 each by Does not apply route every 8 (eight) hours as needed. 100 each 12   HYDROcodone-acetaminophen (NORCO) 5-325 MG tablet Take 1 tablet by mouth every 4 (four) hours as needed for severe pain. (Patient not taking: Reported on 05/24/2021) 6 tablet 0   insulin aspart (NOVOLOG FLEXPEN) 100 UNIT/ML FlexPen Inject 12 Units into the skin 3 (three) times daily with meals. 30 mL 3   insulin lispro (HUMALOG) 100 UNIT/ML KwikPen INJECT 12 UNITS INTO THE SKIN 3 (THREE) TIMES DAILY WITH MEALS. 30 mL 3   liraglutide (VICTOZA) 18 MG/3ML SOPN INJECT 1.8 MG INTO THE SKIN ONCE DAILY. 9 mL 6   lisinopril-hydrochlorothiazide (ZESTORETIC) 20-25 MG tablet TAKE 1 TABLET BY MOUTH DAILY. 90 tablet 1   loratadine (CLARITIN) 10 MG tablet Take by mouth daily as needed for allergies.  (Patient not taking: Reported on 05/24/2021)     QUEtiapine (SEROQUEL) 50 MG tablet Take 1-2 tablets (50-100 mg total) by mouth at bedtime. 60 tablet 3   No facility-administered medications prior to visit.    Allergies  Allergen Reactions   Penicillins Hives and Shortness Of Breath    Has patient had a PCN  reaction causing immediate rash, facial/tongue/throat swelling, SOB  or lightheadedness with hypotension: Yes Has patient had a PCN reaction causing severe rash involving mucus membranes or skin necrosis: No Has patient had a PCN reaction that required hospitalization No Has patient had a PCN reaction occurring within the last 10 years: No If all of the above answers are "NO", then may proceed with Cephalosporin use.   Azithromycin Hives   Erythromycin Base Other (See Comments)    Unknown   Sulfa Antibiotics Hives   Tramadol Hives       Objective:    BP (!) 151/89   Pulse 78   Ht _0  (1.626 m)   Wt 176 lb (79.8 kg)   SpO2 98%   BMI 30.21 kg/m  Wt Readings from Last 3 Encounters:  05/24/21 176 lb (79.8 kg)  04/13/20 197 lb (89.4 kg)  01/17/20 197 lb (89.4 kg)    Physical Exam Vitals and nursing note reviewed.  Constitutional:      Appearance: She is well-developed.  HENT:     Head: Normocephalic and atraumatic.  Cardiovascular:     Rate and Rhythm: Normal rate and regular rhythm.     Heart sounds: Normal heart sounds. No murmur heard.   No friction rub. No gallop.  Pulmonary:     Effort: Pulmonary effort is normal. No tachypnea or respiratory distress.     Breath sounds: Normal breath sounds. No decreased breath sounds, wheezing, rhonchi or rales.  Chest:     Chest wall: No tenderness.  Abdominal:     General: Bowel sounds are normal.     Palpations: Abdomen is soft.     Tenderness: There is no right CVA tenderness or left CVA tenderness.  Musculoskeletal:        General: Normal range of motion.     Cervical back: Normal range of motion.  Skin:    General: Skin is warm and dry.  Neurological:     Mental Status: She is alert and oriented to person, place, and time.     Coordination: Coordination normal.  Psychiatric:        Behavior: Behavior normal. Behavior is cooperative.        Thought Content: Thought content normal.        Judgment: Judgment normal.         Patient has been counseled extensively about nutrition and  exercise as well as the importance of adherence with medications and regular follow-up. The patient was given clear instructions to go to ER or return to medical center if symptoms don't improve, worsen or new problems develop. The patient verbalized understanding.   Follow-up: Return in about 4 weeks (around 06/21/2021) for meter check with luke. See me in 3 months.   Gildardo Pounds, FNP-BC [facility] and [facility] [place], [place]

## 2021-05-25 ENCOUNTER — Other Ambulatory Visit: Payer: Self-pay

## 2021-05-25 LAB — URINALYSIS, COMPLETE
Bilirubin, UA: NEGATIVE
Ketones, UA: NEGATIVE
Nitrite, UA: POSITIVE — AB
Specific Gravity, UA: 1.029 (ref 1.005–1.030)
Urobilinogen, Ur: 1 mg/dL (ref 0.2–1.0)
pH, UA: 5.5 (ref 5.0–7.5)

## 2021-05-25 LAB — CERVICOVAGINAL ANCILLARY ONLY
Bacterial Vaginitis (gardnerella): NEGATIVE
Candida Glabrata: POSITIVE — AB
Candida Vaginitis: NEGATIVE
Chlamydia: NEGATIVE
Comment: NEGATIVE
Comment: NEGATIVE
Comment: NEGATIVE
Comment: NEGATIVE
Comment: NEGATIVE
Comment: NORMAL
Neisseria Gonorrhea: NEGATIVE
Trichomonas: NEGATIVE

## 2021-05-25 LAB — MICROSCOPIC EXAMINATION
Bacteria, UA: NONE SEEN
Casts: NONE SEEN /lpf
WBC, UA: >30 /hpf — AB (ref 0–5)

## 2021-05-25 LAB — CMP14+EGFR
ALT: 20 IU/L (ref 0–32)
AST: 10 IU/L (ref 0–40)
Albumin/Globulin Ratio: 1.3 (ref 1.2–2.2)
Albumin: 4.2 g/dL (ref 3.8–4.9)
Alkaline Phosphatase: 104 IU/L (ref 44–121)
BUN/Creatinine Ratio: 14 (ref 9–23)
BUN: 16 mg/dL (ref 6–24)
Bilirubin Total: 0.4 mg/dL (ref 0.0–1.2)
CO2: 26 mmol/L (ref 20–29)
Calcium: 9.8 mg/dL (ref 8.7–10.2)
Chloride: 99 mmol/L (ref 96–106)
Creatinine, Ser: 1.11 mg/dL — ABNORMAL HIGH (ref 0.57–1.00)
Globulin, Total: 3.2 g/dL (ref 1.5–4.5)
Glucose: 301 mg/dL — ABNORMAL HIGH (ref 70–99)
Potassium: 4.1 mmol/L (ref 3.5–5.2)
Sodium: 139 mmol/L (ref 134–144)
Total Protein: 7.4 g/dL (ref 6.0–8.5)
eGFR: 58 mL/min/{1.73_m2} — ABNORMAL LOW (ref >59)

## 2021-05-25 LAB — MICROALBUMIN / CREATININE URINE RATIO
Creatinine, Urine: 192.6 mg/dL
Microalb/Creat Ratio: 453 mg/g creat — ABNORMAL HIGH (ref 0–29)
Microalbumin, Urine: 872.2 ug/mL

## 2021-05-25 LAB — HIV ANTIBODY (ROUTINE TESTING W REFLEX): HIV Screen 4th Generation wRfx: NONREACTIVE

## 2021-05-26 ENCOUNTER — Encounter: Payer: Self-pay | Admitting: Nurse Practitioner

## 2021-05-26 MED ORDER — FLUCONAZOLE 150 MG PO TABS
150.0000 mg | ORAL_TABLET | Freq: Once | ORAL | 0 refills | Status: AC
  Filled 2021-05-26: qty 1, 1d supply, fill #0

## 2021-05-26 MED ORDER — NITROFURANTOIN MONOHYD MACRO 100 MG PO CAPS
100.0000 mg | ORAL_CAPSULE | Freq: Two times a day (BID) | ORAL | 0 refills | Status: AC
  Filled 2021-05-26: qty 10, 5d supply, fill #0

## 2021-05-27 ENCOUNTER — Telehealth: Payer: Self-pay

## 2021-05-27 ENCOUNTER — Other Ambulatory Visit: Payer: Self-pay

## 2021-05-27 NOTE — Telephone Encounter (Signed)
-----   Message from Gildardo Pounds, NP sent at 05/26/2021  8:18 PM EDT ----- Urine positive for UTI. Antibiotics and diflucan sent for yeast infection as well. Creatinine for kidney function is slightly elevated. Will repeat at next visit. HIV negative. Urine shows microscopic diabetic kidney changes. Continue your lisinopril BP medication as prescribed to help protect your kidneys

## 2021-05-30 ENCOUNTER — Other Ambulatory Visit: Payer: Self-pay

## 2021-05-31 ENCOUNTER — Other Ambulatory Visit: Payer: Self-pay

## 2021-06-21 ENCOUNTER — Ambulatory Visit: Payer: No Typology Code available for payment source | Admitting: Pharmacist

## 2021-07-13 IMAGING — MG DIGITAL DIAGNOSTIC BILAT W/ TOMO W/ CAD
8 of 17 series · 8 of 40 positions shown · non-contrast
Comparison: Previous exam(s).

ACR Breast Density Category a: The breast tissue is almost entirely
fatty.

CLINICAL DATA: 54-year-old female presenting with a lump in the
right breast which is no longer present. She is also due for annual
exam.

EXAM:
DIGITAL DIAGNOSTIC BILATERAL MAMMOGRAM WITH CAD AND TOMO

[L ML]
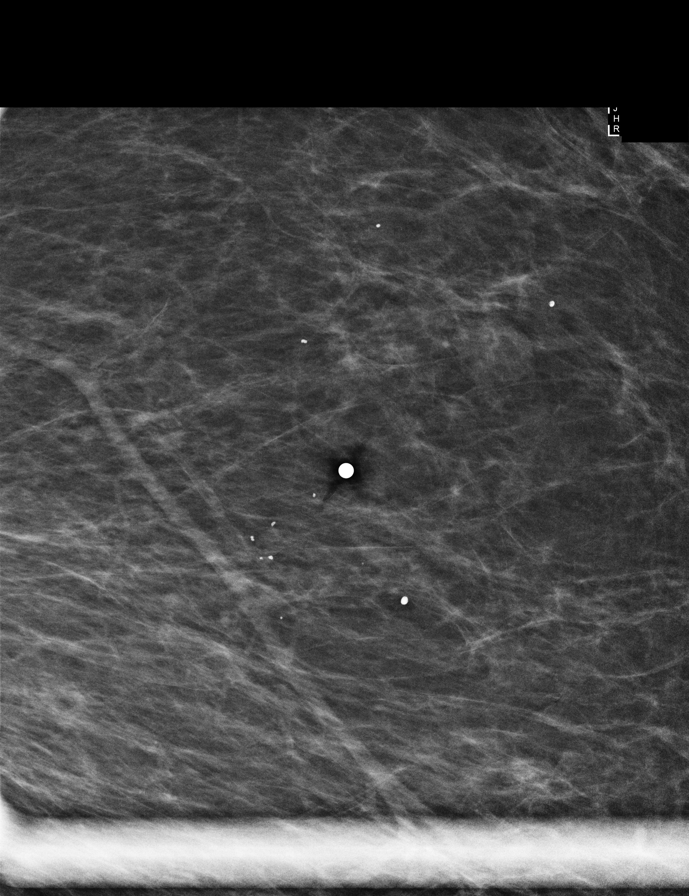

[L CC (1 of 2)]
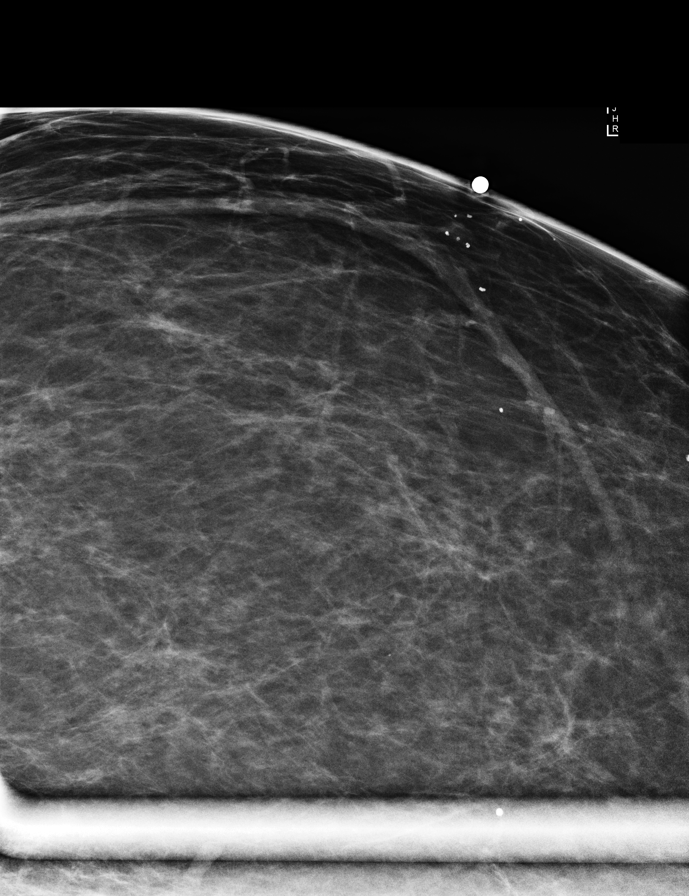

[L CC (2 of 2)]
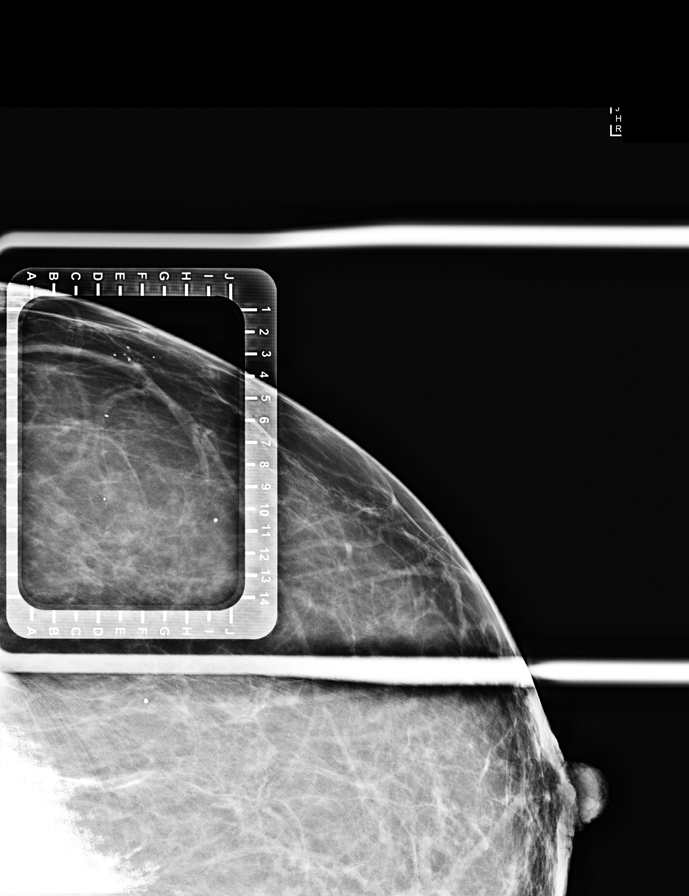

[L MLO synth-2D (1 of 2)]
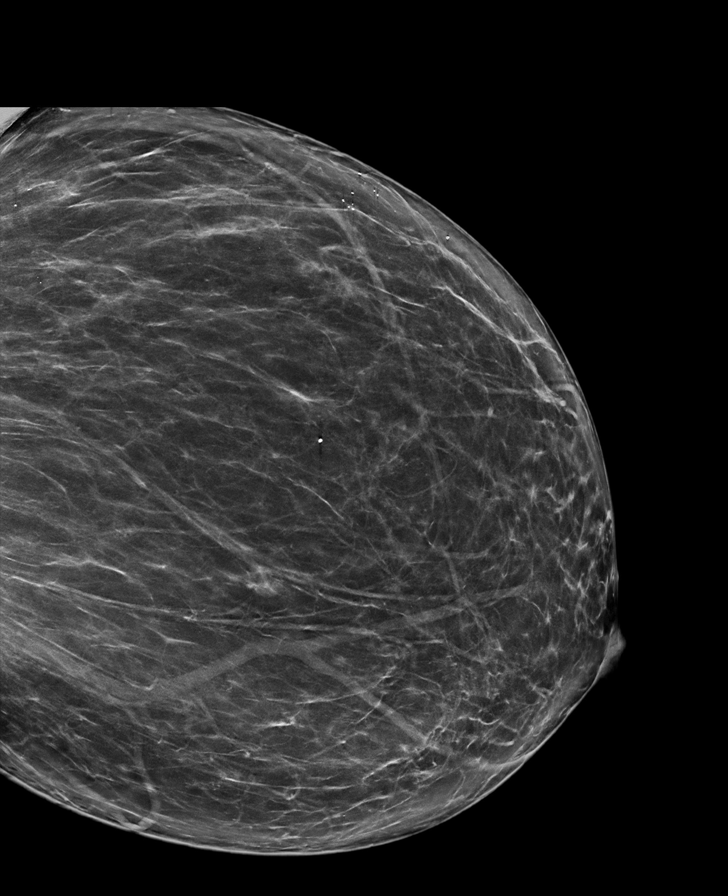

[L MLO synth-2D (2 of 2)]
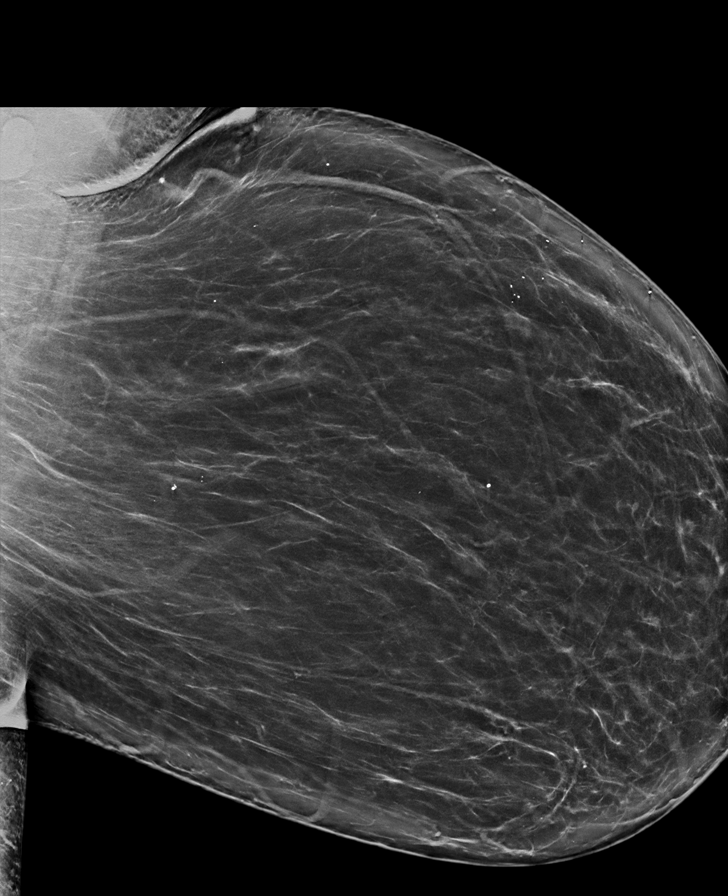

[R CC synth-2D]
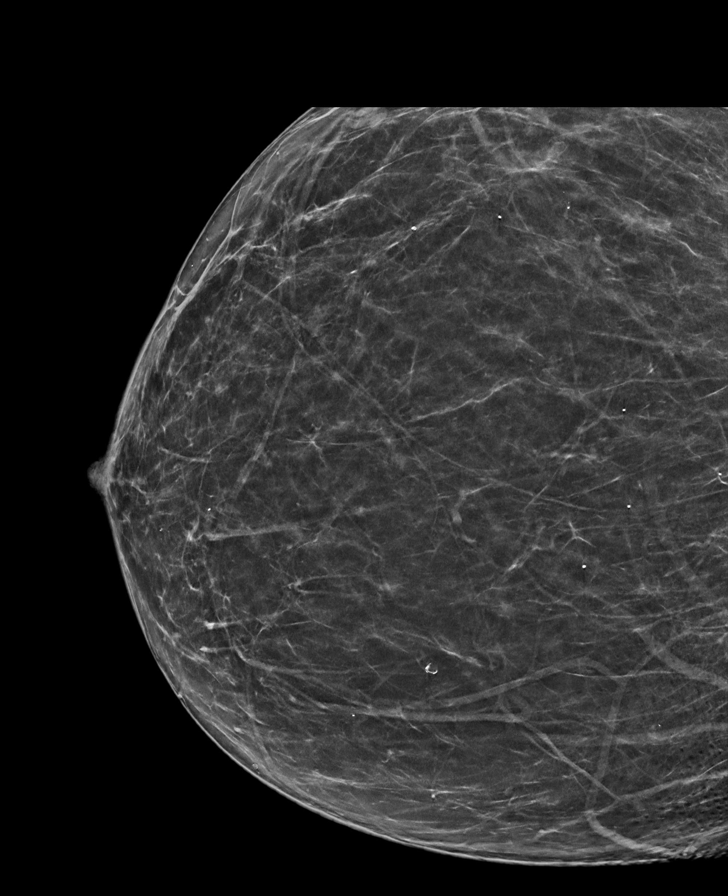

[L CC synth-2D (1 of 2)]
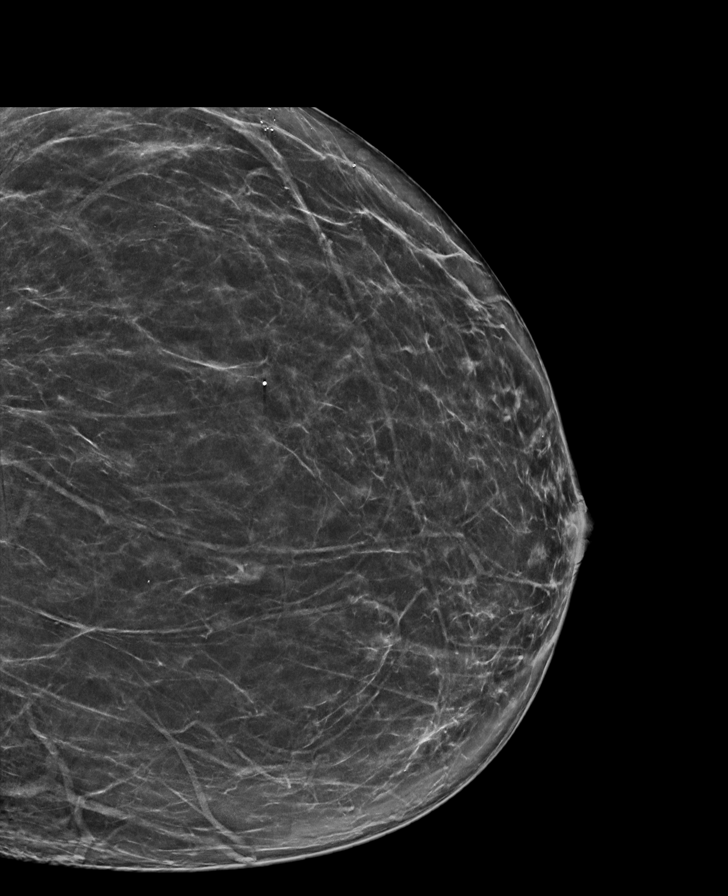

[L CC synth-2D (2 of 2)]
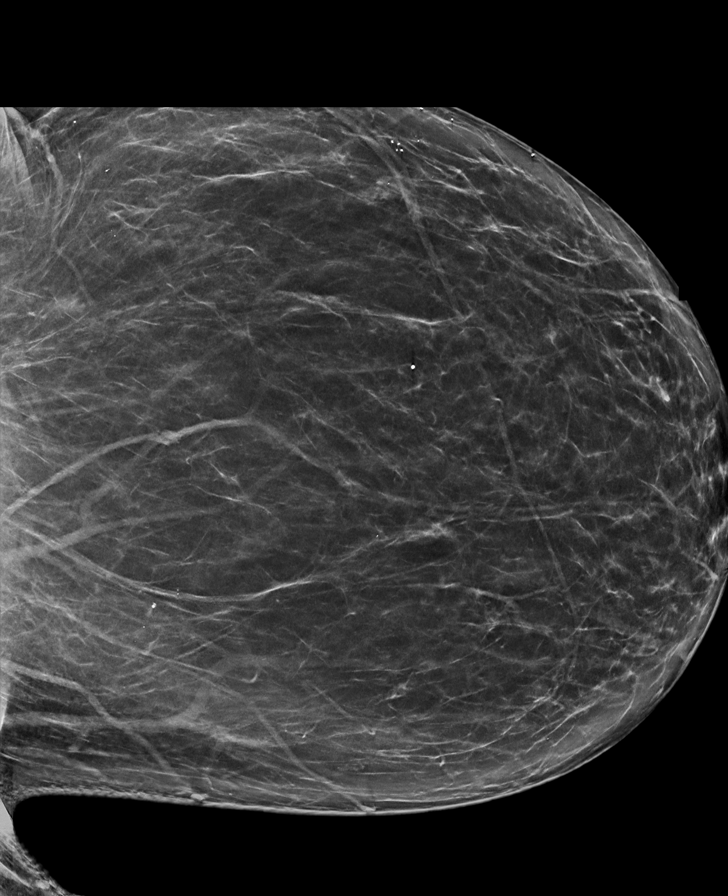

[8 of 40 positions shown; findings below may reference images not displayed]

FINDINGS: Right breast: No suspicious mass, distortion, or microcalcifications
are identified to suggest presence of malignancy.

Left breast: Spot 2D magnification views were performed in addition
to standard full field views. There are benign grouped round
calcifications in the upper outer left breast which are most likely
within the skin.

Mammographic images were processed with CAD.
IMPRESSION: No mammographic evidence of malignancy in the bilateral breasts.

RECOMMENDATION:
Screening mammogram in one year.(Code:CZ-[id number]-[id number])

I have discussed the findings and recommendations with the patient.
If applicable, a reminder letter will be sent to the patient
regarding the next appointment.

BI-RADS CATEGORY  2: Benign.

## 2021-08-01 ENCOUNTER — Ambulatory Visit: Payer: No Typology Code available for payment source | Admitting: Nurse Practitioner

## 2021-08-16 DIAGNOSIS — M179 Osteoarthritis of knee, unspecified: Secondary | ICD-10-CM | POA: Insufficient documentation

## 2021-09-14 DIAGNOSIS — M25561 Pain in right knee: Secondary | ICD-10-CM | POA: Insufficient documentation

## 2021-09-18 DIAGNOSIS — E0810 Diabetes mellitus due to underlying condition with ketoacidosis without coma: Secondary | ICD-10-CM | POA: Insufficient documentation

## 2021-09-20 ENCOUNTER — Other Ambulatory Visit: Payer: Self-pay

## 2021-09-23 ENCOUNTER — Other Ambulatory Visit: Payer: Self-pay

## 2021-09-23 MED ORDER — LISINOPRIL-HYDROCHLOROTHIAZIDE 20-25 MG PO TABS
ORAL_TABLET | ORAL | 0 refills | Status: DC
  Filled 2021-09-23: qty 30, 30d supply, fill #0

## 2021-09-23 MED ORDER — PANTOPRAZOLE SODIUM 40 MG PO TBEC
40.0000 mg | DELAYED_RELEASE_TABLET | Freq: Every day | ORAL | 0 refills | Status: DC
  Filled 2021-09-23 – 2021-10-05 (×2): qty 30, 30d supply, fill #0

## 2021-09-23 MED ORDER — INSULIN NPH (HUMAN) (ISOPHANE) 100 UNIT/ML ~~LOC~~ SUSP
SUBCUTANEOUS | 0 refills | Status: DC
  Filled 2021-09-23 – 2021-10-05 (×2): qty 30, 30d supply, fill #0

## 2021-09-23 MED ORDER — ATORVASTATIN CALCIUM 40 MG PO TABS
ORAL_TABLET | ORAL | 3 refills | Status: DC
  Filled 2021-09-23 – 2021-10-05 (×2): qty 30, 30d supply, fill #0
  Filled 2021-12-26 – 2022-05-05 (×2): qty 30, 30d supply, fill #1
  Filled 2022-06-26: qty 30, 30d supply, fill #2
  Filled 2022-07-28: qty 30, 30d supply, fill #3
  Filled 2022-08-22 (×2): qty 30, 30d supply, fill #4

## 2021-09-26 ENCOUNTER — Other Ambulatory Visit: Payer: Self-pay

## 2021-09-27 ENCOUNTER — Other Ambulatory Visit (HOSPITAL_COMMUNITY): Payer: Self-pay

## 2021-09-28 ENCOUNTER — Ambulatory Visit: Payer: No Typology Code available for payment source | Admitting: Nurse Practitioner

## 2021-09-29 ENCOUNTER — Other Ambulatory Visit: Payer: Self-pay

## 2021-09-30 ENCOUNTER — Ambulatory Visit: Payer: No Typology Code available for payment source | Admitting: Nurse Practitioner

## 2021-09-30 DIAGNOSIS — E1110 Type 2 diabetes mellitus with ketoacidosis without coma: Secondary | ICD-10-CM | POA: Diagnosis present

## 2021-10-03 ENCOUNTER — Other Ambulatory Visit: Payer: Self-pay

## 2021-10-04 ENCOUNTER — Other Ambulatory Visit: Payer: Self-pay

## 2021-10-04 MED ORDER — INSULIN GLARGINE 100 UNIT/ML ~~LOC~~ SOLN
SUBCUTANEOUS | 0 refills | Status: DC
  Filled 2021-10-04 – 2021-11-28 (×2): qty 20, 28d supply, fill #0

## 2021-10-05 ENCOUNTER — Other Ambulatory Visit: Payer: Self-pay

## 2021-10-06 ENCOUNTER — Other Ambulatory Visit: Payer: Self-pay

## 2021-10-06 MED ORDER — LISINOPRIL-HYDROCHLOROTHIAZIDE 20-25 MG PO TABS
ORAL_TABLET | ORAL | 0 refills | Status: DC
  Filled 2021-10-06 – 2021-11-28 (×2): qty 30, 30d supply, fill #0

## 2021-10-06 MED ORDER — INSULIN GLARGINE 100 UNIT/ML ~~LOC~~ SOLN
SUBCUTANEOUS | 0 refills | Status: DC
  Filled 2021-10-06: qty 10, 28d supply, fill #0

## 2021-11-28 ENCOUNTER — Other Ambulatory Visit: Payer: Self-pay

## 2021-12-01 ENCOUNTER — Other Ambulatory Visit: Payer: Self-pay

## 2021-12-01 ENCOUNTER — Other Ambulatory Visit: Payer: Self-pay | Admitting: Pharmacist

## 2021-12-01 ENCOUNTER — Telehealth: Payer: Self-pay

## 2021-12-01 MED ORDER — INSULIN GLARGINE 100 UNIT/ML ~~LOC~~ SOLN
SUBCUTANEOUS | 0 refills | Status: DC
  Filled 2021-12-01 (×2): qty 10, 16d supply, fill #0

## 2021-12-01 MED ORDER — BASAGLAR KWIKPEN 100 UNIT/ML ~~LOC~~ SOPN
PEN_INJECTOR | SUBCUTANEOUS | 0 refills | Status: DC
  Filled 2021-12-01: qty 15, 25d supply, fill #0

## 2021-12-01 NOTE — Telephone Encounter (Signed)
Refill sent for Lantus at dosing consistent with discharge instructions from her most recent hospitalization.  ?

## 2021-12-01 NOTE — Telephone Encounter (Signed)
Patient came into clinic requesting an insulin refill. She explained that she was given a prescription of lantus for 1 month with no refills  when she was discharged from the hospital 10/06/2021 and she is now out.  She said her blood sugar this morning was 566. She has used family member's insulin the past few days.  ? ?She was last seen by Geryl Rankins, NP 05/24/2021. ?There are no available appointments today at [facility], [facility], [place] today  She has been scheduled to see Carrolyn Meiers, PA @ [facility] - 12/05/2021.   ? ?Lurena Joiner, she is requesting the order for lantus  be sent to [facility].  She then said that the pharmacist told her that her insurance, [employer], will not cover lantus.  I do not see [employer] listed in Epic and I sent a message to Cheryle Horsfall inquiring if the pharmacy has any record of insurance for her.  ? ?

## 2021-12-05 ENCOUNTER — Ambulatory Visit: Payer: No Typology Code available for payment source | Admitting: Physician Assistant

## 2021-12-06 ENCOUNTER — Other Ambulatory Visit: Payer: Self-pay

## 2021-12-06 ENCOUNTER — Encounter: Payer: Self-pay | Admitting: Physician Assistant

## 2021-12-06 ENCOUNTER — Ambulatory Visit (INDEPENDENT_AMBULATORY_CARE_PROVIDER_SITE_OTHER): Payer: Self-pay | Admitting: Physician Assistant

## 2021-12-06 VITALS — BP 156/85 | HR 87 | Temp 98.8°F | Resp 18 | Ht 64.0 in | Wt 173.0 lb

## 2021-12-06 DIAGNOSIS — E1165 Type 2 diabetes mellitus with hyperglycemia: Secondary | ICD-10-CM | POA: Diagnosis not present

## 2021-12-06 DIAGNOSIS — K047 Periapical abscess without sinus: Secondary | ICD-10-CM

## 2021-12-06 DIAGNOSIS — M25561 Pain in right knee: Secondary | ICD-10-CM

## 2021-12-06 DIAGNOSIS — E118 Type 2 diabetes mellitus with unspecified complications: Secondary | ICD-10-CM

## 2021-12-06 DIAGNOSIS — R7989 Other specified abnormal findings of blood chemistry: Secondary | ICD-10-CM

## 2021-12-06 DIAGNOSIS — Z23 Encounter for immunization: Secondary | ICD-10-CM

## 2021-12-06 DIAGNOSIS — G8929 Other chronic pain: Secondary | ICD-10-CM

## 2021-12-06 DIAGNOSIS — I10 Essential (primary) hypertension: Secondary | ICD-10-CM

## 2021-12-06 DIAGNOSIS — F331 Major depressive disorder, recurrent, moderate: Secondary | ICD-10-CM

## 2021-12-06 DIAGNOSIS — E559 Vitamin D deficiency, unspecified: Secondary | ICD-10-CM

## 2021-12-06 MED ORDER — SERTRALINE HCL 50 MG PO TABS
50.0000 mg | ORAL_TABLET | Freq: Every day | ORAL | 3 refills | Status: DC
  Filled 2021-12-06: qty 30, 30d supply, fill #0

## 2021-12-06 MED ORDER — DOXYCYCLINE HYCLATE 100 MG PO TABS
100.0000 mg | ORAL_TABLET | Freq: Two times a day (BID) | ORAL | 0 refills | Status: DC
  Filled 2021-12-06: qty 20, 10d supply, fill #0

## 2021-12-06 NOTE — Progress Notes (Signed)
? ?Established Patient Office Visit ? ?Subjective:  ?Patient ID: Barbara Wang, female    DOB: 21-Nov-1965  Age: 56 y.o. MRN: [id number] ? ?CC:  ?Chief Complaint  ?Patient presents with  ? Knee Pain  ?  Right  ? ? ?HPI ?Barbara Wang states that she was hospitalized February 3 through October 06, 2021 ? ?Hospital Course: ?Barbara Wang is a 56 y.o. 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 She presented on 09/30/2021 via EMS following the gradual onset of weakness, confusion, and lethargy over the past 2 days. In the ED, she was noted to be markedly hyperglycemic with an increased anion gap. Resuscitation with IV crystalloid was initiated, IV insulin administered, and admitted to ICU. Next day patient Anion gap closed BS within reasonable range and transitioned to lantus. Mental status continues to wax and wane. Deemed stable to transfer to medical floors on 2/4. Rest of hospital coarse as below  ?  ?Diabetic Ketoacidosis ?DKA secondary to ketosis-prone T2DM w/o coma ?Metabolic acidosis, resolved ? ?Lab Results  ?Component Value Date  ?  HBA1C >15.0 (H) 09/30/2021  ?  ?Per patient she was not using insulin at home ?Discharged on Lantus 35 units in a.m. and 25 units at p.m..  ?Discussed with patient daughter on the day of discharge to get the insulin supplies from [facility] daughter was in agreement.  ?Monitor blood glucose levels 3-4 times per day and maintain a log ?  ?  ?Altered Mental Status likely secondary to Acute Toxic Metabolic Encephalopathy from hyperglycemia with DKA ?Less likely urinary Tract Infection, unlikely Fungal UTI  ?Sepsis ruled out ?Leukocytosis present on admission normalized ?Avoid sedatives and hypnotics ?Control blood glucose as above ?UA 500 Leukoesterase and moderate bacteria on admission but urine  culture negative for any growth, she was given empirically ceftriaxone and fluconazole given previous urine culture results but after recent urine culture results antibiotics were discontinued  ?.ultrasound urinary system with no hydronephrosis, simple 1.3 cm renal cyst ?-CT brain negative for acute intracranial abnormalities, unchanged chronic right lenticulostriate infarct ?Blood cultures had remained negative since admission.  ?Patient was oriented x4 on the day of discharge ?  ?  ?AKI in setting of DKA, likely pre-renal and poor oral intake  ? ?Creatinine (MG/DL)  ?Date Value  ?10/05/2021 1.02  ?10/04/2021 1.53 (H)  ?Creat baseline around 1.14 and was up 2.25. ?Ultrasound renal negative for hydronephrosis showed simple 1.3 cm cyst ?With IV fluids her kidney function back to normal.  ?  ?HTN ?Cont home lisinopril/HCTZ blood pressure remained stable refills provided ?  ?HLD ?Cont lipitor ?  ?On The day of discharge patient was waiting in the hallways mentioned that she is ready to go home and was sick of staying inside the room. She was feeling much better, was oriented x4. I have advised her to monitor her blood glucose level and take the insulin as directed also encouraged her to follow-up with her PCP.  ?Discussed with patient's daughter Barbara Wang on phone she was planning to pick her up and then going straight to come into clinic to pick her medications. I have advised her to pick blood pressure medications as well and also help the patient for the follow-up with the physician, patient's daughter showed understanding and agreed with the plan for discharge today.  ? ?States today  she has been taking her medications as directed.  States that she does not check her blood glucose levels at home, but does plan on starting. ? ?Reports that she does not check her blood pressure at home, denies any hypertensive symptoms today. ? ?States that she has been having dental pain, states that she feels swelling in her lower  right jaw near her front teeth, states that this has been present for the past couple of weeks.  States that she is working on getting an appointment with a dentist for follow-up. ? ?Does endorse that she has been having depressed moods. ? ? ?  12/06/2021  ? 10:25 AM 12/12/2019  ?  1:37 PM 07/22/2019  ?  9:01 AM 02/10/2019  ?  9:34 AM 12/23/2018  ?  9:01 AM  ?Depression screen PHQ 2/9  ?Decreased Interest 1 0 1 0 3  ?Down, Depressed, Hopeless 2 0 2 0 3  ?PHQ - 2 Score 3 0 3 0 6  ?Altered sleeping 3 0 '3 1 2  '$ ?Tired, decreased energy 1 0 2 0 2  ?Change in appetite 2 0 1 0 0  ?Feeling bad or failure about yourself  3 0 '1 1 1  '$ ?Trouble concentrating 1 0 1 1 0  ?Moving slowly or fidgety/restless 1 0 1 0 0  ?Suicidal thoughts 2 0 0 0 0  ?PHQ-9 Score 16 0 '12 3 11  '$ ?Difficult doing work/chores     Somewhat difficult  ? ?Adamantly denies any thoughts of self-harm, but does state that she has thoughts that she would be better off dead, states they are mostly related to her health conditions.  States that she has previously taken Zoloft with relief.  States that she was previously also prescribed Seroquel, but does not like taking it states that she does not have trouble falling asleep or staying asleep, states that the Seroquel causes her to oversleep. ? ?States that she is being followed by orthopedics for her right knee pain.  States that she did have a fall on both knees in October 2022, states that she is currently in physical therapy and has been encouraged to use a cane and a knee sleeve.  States that she is due to follow-up with them again next month.  States that she has also had injections in her right knee and they are considering doing gel injections. ? ? ? ? ? ? ? ?Past Medical History:  ?Diagnosis Date  ? Asthma   ? Brain tumor ([place])   ? Cellulitis 06/2015  ? rt hand   ? Depression   ? History of hiatal hernia   ? "it went away on it's own"  ? Hyperlipemia   ? Hypertension   ? Neuropathy   ?  "feet & hands "  ?  Obesity   ? Pancreatitis   ? Type II diabetes mellitus ([place])   ? insulin dependent   ? ? ?Past Surgical History:  ?Procedure Laterality Date  ? ABDOMINAL HYSTERECTOMY    ? BREAST CYST EXCISION Right 10+ yrs ago  ? abscess removed  ? [place]; 1987  ? CHOLECYSTECTOMY  05/11/2012  ? Procedure: LAPAROSCOPIC CHOLECYSTECTOMY WITH INTRAOPERATIVE CHOLANGIOGRAM;  Surgeon: Gayland Curry, MD,FACS;  Location: [facility];  Service: General;  Laterality: N/A;  ? SHOULDER ARTHROSCOPY W/ ROTATOR CUFF REPAIR Right   ? TUBAL LIGATION  10/25/1999  ? Archie Endo 01/11/2011  ? ? ?Family History  ?Problem Relation Age of Onset  ? Stroke Mother   ?  Breast cancer Mother 26  ? Diabetes Father   ? Diabetes Sister   ? Breast cancer Maternal Aunt   ? Breast cancer Maternal Aunt   ? Breast cancer Maternal Grandmother   ? ? ?Social History  ? ?Socioeconomic History  ? Marital status: Single  ?  Spouse name: Not on file  ? Number of children: 2  ? Years of education: Not on file  ? Highest education level: High school graduate  ?Occupational History  ? Not on file  ?Tobacco Use  ? Smoking status: Every Day  ?  Packs/day: 1.00  ?  Years: 40.00  ?  Pack years: 40.00  ?  Types: Cigarettes  ? Smokeless tobacco: Never  ?Vaping Use  ? Vaping Use: Never used  ?Substance and Sexual Activity  ? Alcohol use: No  ? Drug use: No  ? Sexual activity: Yes  ?  Birth control/protection: Surgical  ?Other Topics Concern  ? Not on file  ?Social History Narrative  ? Not on file  ? ?Social Determinants of Health  ? ?Financial Resource Strain: Not on file  ?Food Insecurity: Not on file  ?Transportation Needs: Not on file  ?Physical Activity: Not on file  ?Stress: Not on file  ?Social Connections: Not on file  ?Intimate Partner Violence: Not on file  ? ? ?Outpatient Medications Prior to Visit  ?Medication Sig Dispense Refill  ? acetaminophen (TYLENOL) 500 MG tablet Take 1,000 mg by mouth every 6 (six) hours as needed for moderate pain.    ? albuterol (VENTOLIN HFA)  108 (90 Base) MCG/ACT inhaler Inhale 1-2 puffs into the lungs every 6 (six) hours as needed for wheezing or shortness of breath. 8.5 g 1  ? aspirin 81 MG EC tablet Take 1 tablet (81 mg total) by mouth daily. 90 t

## 2021-12-06 NOTE — Patient Instructions (Addendum)
You are going to restart taking Zoloft on a daily basis to help you with your depressed moods. ? ?I do encourage you to continue your follow-up with orthopedics and physical therapy for your right knee pain.  I encourage you to restart taking the diclofenac twice a day as prescribed by orthopedics. ? ?To help you with your dental pain, you are going to take doxycycline twice a day for 10 days.  Is important that you follow-up with dentistry as soon as possible. ? ?Your blood pressure is elevated today, I encourage you to check your blood pressure at home on a daily basis, keep a written log and have available for all office visits. ? ?We will call you with today's lab results ? ?Kennieth Rad, PA-C ?Physician Assistant ?[facility] ?[email or web address] ?Managing Depression, Adult ?Depression is a mental health condition that affects your thoughts, feelings, and actions. Being diagnosed with depression can bring you relief if you did not know why you have felt or behaved a certain way. It could also leave you feeling overwhelmed with uncertainty about your future. Preparing yourself to manage your symptoms can help you feel more positive about your future. ?How to manage lifestyle changes ?Managing stress ?Stress is your body's reaction to life changes and events, both good and bad. Stress can add to your feelings of depression. Learning to manage your stress can help lessen your feelings of depression. ?Try some of the following approaches to reducing your stress (stress reduction techniques): ?Listen to music that you enjoy and that inspires you. ?Try using a meditation app or take a meditation class. ?Develop a practice that helps you connect with your spiritual self. Walk in nature, pray, or go to a place of worship. ?Do some deep breathing. To do this, inhale slowly through your nose. Pause at the top of your inhale for a few seconds and then exhale slowly,  letting your muscles relax. ?Practice yoga to help relax and work your muscles. ?Choose a stress reduction technique that suits your lifestyle and personality. These techniques take time and practice to develop. Set aside 5-15 minutes a day to do them. Therapists can offer training in these techniques. Other things you can do to manage stress include: ?Keeping a stress diary. ?Knowing your limits and saying no when you think something is too much. ?Paying attention to how you react to certain situations. You may not be able to control everything, but you can change your reaction. ?Adding humor to your life by watching funny films or TV shows. ?Making time for activities that you enjoy and that relax you. ? ?Medicines ?Medicines, such as antidepressants, are often a part of treatment for depression. ?Talk with your pharmacist or health care provider about all the medicines, supplements, and herbal products that you take, their possible side effects, and what medicines and other products are safe to take together. ?Make sure to report any side effects you may have to your health care provider. ?Relationships ?Your health care provider may suggest family therapy, couples therapy, or individual therapy as part of your treatment. ?How to recognize changes ?Everyone responds differently to treatment for depression. As you recover from depression, you may start to: ?Have more interest in doing activities. ?Feel less hopeless. ?Have more energy. ?Overeat less often, or have a better appetite. ?Have better mental focus. ?It is important to recognize if your depression is not getting better or is getting worse. The symptoms you had in the beginning may return, such as: ?  Tiredness (fatigue) or low energy. ?Eating too much or too little. ?Sleeping too much or too little. ?Feeling restless, agitated, or hopeless. ?Trouble focusing or making decisions. ?Unexplained physical complaints. ?Feeling irritable, angry, or  aggressive. ?If you or your family members notice these symptoms coming back, let your health care provider know right away. ?Follow these instructions at home: ?Activity ? ?Try to get some form of exercise each day, such as walking, biking, swimming, or lifting weights. ?Practice stress reduction techniques. ?Engage your mind by taking a class or doing some volunteer work. ?Lifestyle ?Get the right amount and quality of sleep. ?Cut down on using caffeine, tobacco, alcohol, and other potentially harmful substances. ?Eat a healthy diet that includes plenty of vegetables, fruits, whole grains, low-fat dairy products, and lean protein. Do not eat a lot of foods that are high in solid fats, added sugars, or salt (sodium). ?General instructions ?Take over-the-counter and prescription medicines only as told by your health care provider. ?Keep all follow-up visits as told by your health care provider. This is important. ?Where to find support ?Talking to others ?Friends and family members can be sources of support and guidance. Talk to trusted friends or family members about your condition. Explain your symptoms to them, and let them know that you are working with a health care provider to treat your depression. Tell friends and family members how they also can be helpful. ?Finances ?Find appropriate mental health providers that fit with your financial situation. ?Talk with your health care provider about options to get reduced prices on your medicines. ?Where to find more information ?You can find support in your area from: ?Anxiety and Depression Association of America (ADAA): https://www.clark.net/ ?Mental Health America: www.mentalhealthamerica.net ?National Alliance on Mental Illness: www.nami.org ?Contact a health care provider if: ?You stop taking your antidepressant medicines, and you have any of these symptoms: ?Nausea. ?Headache. ?Light-headedness. ?Chills and body aches. ?Not being able to sleep (insomnia). ?You or your  friends and family think your depression is getting worse. ?Get help right away if: ?You have thoughts of hurting yourself or others. ?If you ever feel like you may hurt yourself or others, or have thoughts about taking your own life, get help right away. Go to your nearest emergency department or: ?Call your local emergency services (911 in the [place] ?Call a suicide crisis helpline, such as the [place] at (713)014-2292 or 988 in the [place]. This is open 24 hours a day in the [place] ?Text the Crisis Text Line at 3401260471 (in the [place].). ?Summary ?If you are diagnosed with depression, preparing yourself to manage your symptoms is a good way to feel positive about your future. ?Work with your health care provider on a management plan that includes stress reduction techniques, medicines (if applicable), therapy, and healthy lifestyle habits. ?Keep talking with your health care provider about how your treatment is working. ?If you have thoughts about taking your own life, call a suicide crisis helpline or text a crisis text line. ?This information is not intended to replace advice given to you by your health care provider. Make sure you discuss any questions you have with your health care provider. ?Document Revised: 03/09/2021 Document Reviewed: 06/25/2019 ?Elsevier Patient Education ? [place]. ? ?Recombinant Zoster (Shingles) Vaccine: What You Need to Know ?1. Why get vaccinated? ?Recombinant zoster (shingles) vaccine can prevent shingles. ?Shingles (also called herpes zoster, or just zoster) is a painful skin rash, usually with blisters. In addition to the rash, shingles  can cause fever, headache, chills, or upset stomach. Rarely, shingles can lead to complications such as pneumonia, hearing problems, blindness, brain inflammation (encephalitis), or death. ?The risk of shingles increases with age. The most common complication of shingles is long-term nerve pain called postherpetic  neuralgia (PHN). PHN occurs in the areas where the shingles rash was and can last for months or years after the rash goes away. The pain from PHN can be severe and debilitating. ?The risk of PHN increases with age. An

## 2021-12-06 NOTE — Progress Notes (Signed)
Patient has had coffee with cream and sugar.  ?Patient has taken medication today around 4am. ?Patient reports pain in the r knee at this time described as throbbing. ? ?

## 2021-12-07 ENCOUNTER — Other Ambulatory Visit: Payer: Self-pay

## 2021-12-07 LAB — COMP. METABOLIC PANEL (12)
AST: 28 IU/L (ref 0–40)
Albumin/Globulin Ratio: 1.6 (ref 1.2–2.2)
Albumin: 4.2 g/dL (ref 3.8–4.9)
Alkaline Phosphatase: 101 IU/L (ref 44–121)
BUN/Creatinine Ratio: 27 — ABNORMAL HIGH (ref 9–23)
BUN: 23 mg/dL (ref 6–24)
Bilirubin Total: 0.3 mg/dL (ref 0.0–1.2)
Calcium: 10 mg/dL (ref 8.7–10.2)
Chloride: 100 mmol/L (ref 96–106)
Creatinine, Ser: 0.85 mg/dL (ref 0.57–1.00)
Globulin, Total: 2.7 g/dL (ref 1.5–4.5)
Glucose: 172 mg/dL — ABNORMAL HIGH (ref 70–99)
Potassium: 3.7 mmol/L (ref 3.5–5.2)
Sodium: 142 mmol/L (ref 134–144)
Total Protein: 6.9 g/dL (ref 6.0–8.5)
eGFR: 80 mL/min/{1.73_m2} (ref >59)

## 2021-12-07 LAB — CBC WITH DIFFERENTIAL/PLATELET
Basophils Absolute: 0.1 10*3/uL (ref 0.0–0.2)
Basos: 1 %
EOS (ABSOLUTE): 0.2 10*3/uL (ref 0.0–0.4)
Eos: 2 %
Hematocrit: 42.1 % (ref 34.0–46.6)
Hemoglobin: 14 g/dL (ref 11.1–15.9)
Immature Grans (Abs): 0 10*3/uL (ref 0.0–0.1)
Immature Granulocytes: 0 %
Lymphocytes Absolute: 2.3 10*3/uL (ref 0.7–3.1)
Lymphs: 34 %
MCH: 29.4 pg (ref 26.6–33.0)
MCHC: 33.3 g/dL (ref 31.5–35.7)
MCV: 88 fL (ref 79–97)
Monocytes Absolute: 0.4 10*3/uL (ref 0.1–0.9)
Monocytes: 6 %
Neutrophils Absolute: 4 10*3/uL (ref 1.4–7.0)
Neutrophils: 57 %
Platelets: 339 10*3/uL (ref 150–450)
RBC: 4.76 x10E6/uL (ref 3.77–5.28)
RDW: 12.8 % (ref 11.7–15.4)
WBC: 6.9 10*3/uL (ref 3.4–10.8)

## 2021-12-07 LAB — LIPID PANEL
Chol/HDL Ratio: 2.4 ratio (ref 0.0–4.4)
Cholesterol, Total: 141 mg/dL (ref 100–199)
HDL: 59 mg/dL (ref >39)
LDL Chol Calc (NIH): 66 mg/dL (ref 0–99)
Triglycerides: 84 mg/dL (ref 0–149)
VLDL Cholesterol Cal: 16 mg/dL (ref 5–40)

## 2021-12-07 LAB — VITAMIN D 25 HYDROXY (VIT D DEFICIENCY, FRACTURES): Vit D, 25-Hydroxy: 9 ng/mL — ABNORMAL LOW (ref 30.0–100.0)

## 2021-12-07 LAB — TSH: TSH: 0.383 u[IU]/mL — ABNORMAL LOW (ref 0.450–4.500)

## 2021-12-07 MED ORDER — VITAMIN D (ERGOCALCIFEROL) 1.25 MG (50000 UNIT) PO CAPS
50000.0000 [IU] | ORAL_CAPSULE | ORAL | 2 refills | Status: DC
  Filled 2021-12-07: qty 4, 28d supply, fill #0

## 2021-12-07 NOTE — Addendum Note (Signed)
Addended by: Kennieth Rad on: 12/07/2021 12:32 PM ? ? Modules accepted: Orders ? ?

## 2021-12-08 LAB — MICROALBUMIN / CREATININE URINE RATIO
Creatinine, Urine: 103.5 mg/dL
Microalb/Creat Ratio: 249 mg/g creat — ABNORMAL HIGH (ref 0–29)
Microalbumin, Urine: 257.7 ug/mL

## 2021-12-09 ENCOUNTER — Telehealth: Payer: Self-pay | Admitting: *Deleted

## 2021-12-09 NOTE — Telephone Encounter (Signed)
-----   Message from Kennieth Rad, [place] sent at 12/07/2021 12:32 PM EDT ----- ?Please call patient and let her know that her kidney function and liver function are within normal limits, she does show signs of dehydration.  We are still waiting for her microalbumin urine test to return.  She does not show signs of anemia.  Her cholesterol overall is well controlled, continue current medication.  Her vitamin D levels are low, she needs to take 50,000 units once a week for the next 12 weeks and have it rechecked at that time.  Prescription sent to her pharmacy.  Her thyroid lab test was abnormal, she does need to come in for follow-up testing ?

## 2021-12-09 NOTE — Telephone Encounter (Signed)
Medical Assistant left message on patient's home and cell voicemail. ?Voicemail states to give a call back to [place] with [facility] at [phone]. ?Patient needs lab appointment for further thyroid testing. Patient is aware of results per signed DPR. ?

## 2021-12-16 ENCOUNTER — Telehealth: Payer: Self-pay | Admitting: *Deleted

## 2021-12-16 NOTE — Telephone Encounter (Signed)
Medical Assistant left message on patient's home and cell voicemail. ?Patient is aware of results via VM per signed DPR. ?Patient may view results via mychart and contact the office for any questions at [phone]. ? ?

## 2021-12-16 NOTE — Telephone Encounter (Signed)
-----   Message from Kennieth Rad, PA-C sent at 12/11/2021  9:29 AM EDT ----- ?Please call patient and let her know that her microalbumin levels in her urine continue to be elevated, she is taking lisinopril already, so it is very important that she work on controlling her blood glucose levels. ?

## 2021-12-20 ENCOUNTER — Other Ambulatory Visit: Payer: Self-pay

## 2021-12-26 ENCOUNTER — Other Ambulatory Visit: Payer: Self-pay

## 2021-12-26 ENCOUNTER — Other Ambulatory Visit: Payer: Self-pay | Admitting: Family Medicine

## 2021-12-26 MED ORDER — BASAGLAR KWIKPEN 100 UNIT/ML ~~LOC~~ SOPN
PEN_INJECTOR | SUBCUTANEOUS | 0 refills | Status: DC
  Filled 2021-12-26: qty 15, 25d supply, fill #0

## 2021-12-27 ENCOUNTER — Other Ambulatory Visit: Payer: Self-pay

## 2022-01-02 ENCOUNTER — Other Ambulatory Visit: Payer: Self-pay

## 2022-01-03 ENCOUNTER — Other Ambulatory Visit: Payer: Self-pay

## 2022-01-18 ENCOUNTER — Ambulatory Visit: Payer: Self-pay | Admitting: Physician Assistant

## 2022-01-19 ENCOUNTER — Other Ambulatory Visit: Payer: Self-pay | Admitting: Nurse Practitioner

## 2022-01-19 DIAGNOSIS — E1165 Type 2 diabetes mellitus with hyperglycemia: Secondary | ICD-10-CM

## 2022-01-19 NOTE — Telephone Encounter (Signed)
Medication Refill - Medication:  Insulin Glargine (BASAGLAR KWIKPEN) 100 UNIT/ML  Lisinopril-hydrochlorothiazide (ZESTORETIC) 20-25 MG tablet  insulin lispro (HUMALOG) 100 UNIT/ML KwikPen  Pt missed her appt yesterday and thought it was today / she scheduled new appt for 5.31.23 with Cari at [facility] / please advise if pt can get courtesy refill until appt / she is out of meds    Has the patient contacted their pharmacy? Yes.   (Agent: If no, request that the patient contact the pharmacy for the refill. If patient does not wish to contact the pharmacy document the reason why and proceed with request.) (Agent: If yes, when and what did the pharmacy advise?)  Preferred Pharmacy (with phone number or street name): [facility] [address] [place], [place] [address]  Phone:  [phone]  Fax:  [phone]  Has the patient been seen for an appointment in the last year OR does the patient have an upcoming appointment? Yes.    Agent: Please be advised that RX refills may take up to 3 business days. We ask that you follow-up with your pharmacy.

## 2022-01-19 NOTE — Telephone Encounter (Signed)
Please read note below

## 2022-01-20 ENCOUNTER — Other Ambulatory Visit: Payer: Self-pay

## 2022-01-20 MED ORDER — INSULIN LISPRO (1 UNIT DIAL) 100 UNIT/ML (KWIKPEN)
PEN_INJECTOR | SUBCUTANEOUS | 0 refills | Status: DC
  Filled 2022-01-20: qty 30, 83d supply, fill #0

## 2022-01-20 MED ORDER — BASAGLAR KWIKPEN 100 UNIT/ML ~~LOC~~ SOPN
PEN_INJECTOR | SUBCUTANEOUS | 0 refills | Status: DC
  Filled 2022-01-20: qty 15, 25d supply, fill #0

## 2022-01-20 NOTE — Telephone Encounter (Signed)
Requested Prescriptions  Pending Prescriptions Disp Refills  . Insulin Glargine (BASAGLAR KWIKPEN) 100 UNIT/ML 15 mL 0    Sig: Inject 25 units subcutaneously in the morning and 35 units subcutaneously in the evening.     Endocrinology:  Diabetes - Insulins Failed - 01/19/2022  1:13 PM      Failed - HBA1C is between 0 and 7.9 and within 180 days    Hemoglobin A1C  Date Value Ref Range Status  05/24/2021 13.4 (A) 4.0 - 5.6 % Final   Hgb A1c MFr Bld  Date Value Ref Range Status  12/18/2019 9.4 (H) 4.8 - 5.6 % Final    Comment:             Prediabetes: 5.7 - 6.4          Diabetes: >6.4          Glycemic control for adults with diabetes: <7.0          Passed - Valid encounter within last 6 months    Recent Outpatient Visits          1 month ago Poorly controlled type 2 diabetes mellitus with complication [facility])   Primary [facility] at [facility], Cari S, PA-C   8 months ago Uncontrolled type 2 diabetes mellitus with hyperglycemia [facility])   [place], [place] W, NP   2 years ago Uncontrolled type 2 diabetes mellitus with hyperglycemia [facility])   [place], Vernia Buff, NP   2 years ago Need for influenza vaccination   [place], RPH-CPP   2 years ago Uncontrolled type 2 diabetes mellitus with hyperglycemia [facility])   [place], MD      Future Appointments            In 5 days Mayers, Loraine Grip, PA-C Primary Care at [facility]           . lisinopril-hydrochlorothiazide (ZESTORETIC) 20-25 MG tablet 30 tablet     Sig: Take 1 tablet by mouth daily for 30 days.     Cardiovascular:  ACEI + Diuretic Combos Failed - 01/19/2022  1:13 PM      Failed - Last BP in normal range    BP Readings from Last 1 Encounters:  12/06/21 (!) 156/85         Passed - Na in normal range and within 180 days    Sodium  Date  Value Ref Range Status  12/06/2021 142 134 - 144 mmol/L Final         Passed - K in normal range and within 180 days    Potassium  Date Value Ref Range Status  12/06/2021 3.7 3.5 - 5.2 mmol/L Final         Passed - Cr in normal range and within 180 days    Creat  Date Value Ref Range Status  06/11/2017 0.88 0.50 - 1.05 mg/dL Final    Comment:    For patients >33 years of age, the reference limit for Creatinine is approximately 13% higher for people identified as African-American. .    Creatinine, Ser  Date Value Ref Range Status  12/06/2021 0.85 0.57 - 1.00 mg/dL Final   Creatinine, Urine  Date Value Ref Range Status  02/02/2016 128 20 - 320 mg/dL Final         Passed - eGFR is 30 or above  and within 180 days    GFR, Est African American  Date Value Ref Range Status  09/18/2016 >89 >=60 mL/min Final   GFR calc Af Amer  Date Value Ref Range Status  01/17/2020 >60 >60 mL/min Final   GFR, Est Non African American  Date Value Ref Range Status  09/18/2016 78 >=60 mL/min Final   GFR calc non Af Amer  Date Value Ref Range Status  01/17/2020 >60 >60 mL/min Final   eGFR  Date Value Ref Range Status  12/06/2021 80 >59 mL/min/1.73 Final         Passed - Patient is not pregnant      Passed - Valid encounter within last 6 months    Recent Outpatient Visits          1 month ago Poorly controlled type 2 diabetes mellitus with complication ([place])   Primary Care at [facility], Cari S, PA-C   8 months ago Uncontrolled type 2 diabetes mellitus with hyperglycemia [facility])   [place], [place] W, NP   2 years ago Uncontrolled type 2 diabetes mellitus with hyperglycemia [facility])   [place], Vernia Buff, NP   2 years ago Need for influenza vaccination   [place], RPH-CPP   2 years ago Uncontrolled type 2 diabetes mellitus with hyperglycemia  [facility])   [place], MD      Future Appointments            In 5 days Mayers, Loraine Grip, PA-C Primary Care at [facility]           . insulin lispro (HUMALOG) 100 UNIT/ML KwikPen 30 mL 0    Sig: INJECT 12 UNITS INTO THE SKIN 3 (THREE) TIMES DAILY WITH MEALS.     Endocrinology:  Diabetes - Insulins Failed - 01/19/2022  1:13 PM      Failed - HBA1C is between 0 and 7.9 and within 180 days    Hemoglobin A1C  Date Value Ref Range Status  05/24/2021 13.4 (A) 4.0 - 5.6 % Final   Hgb A1c MFr Bld  Date Value Ref Range Status  12/18/2019 9.4 (H) 4.8 - 5.6 % Final    Comment:             Prediabetes: 5.7 - 6.4          Diabetes: >6.4          Glycemic control for adults with diabetes: <7.0          Passed - Valid encounter within last 6 months    Recent Outpatient Visits          1 month ago Poorly controlled type 2 diabetes mellitus with complication [facility])   Primary Care at [facility], Cari S, PA-C   8 months ago Uncontrolled type 2 diabetes mellitus with hyperglycemia [facility])   [place], [place] W, NP   2 years ago Uncontrolled type 2 diabetes mellitus with hyperglycemia [facility])   [place], Vernia Buff, NP   2 years ago Need for influenza vaccination   [place], RPH-CPP   2 years ago Uncontrolled type 2 diabetes mellitus with hyperglycemia [facility])   [facility] Elsie Stain, MD  Future Appointments            In 5 days Mayers, Loraine Grip, PA-C Primary Care at [facility]

## 2022-01-25 ENCOUNTER — Ambulatory Visit (INDEPENDENT_AMBULATORY_CARE_PROVIDER_SITE_OTHER): Payer: Commercial Managed Care - HMO | Admitting: Physician Assistant

## 2022-01-25 ENCOUNTER — Encounter: Payer: Self-pay | Admitting: Physician Assistant

## 2022-01-25 ENCOUNTER — Other Ambulatory Visit: Payer: Self-pay

## 2022-01-25 VITALS — BP 128/77 | HR 107 | Temp 98.0°F | Resp 18 | Ht 64.0 in | Wt 173.0 lb

## 2022-01-25 DIAGNOSIS — E118 Type 2 diabetes mellitus with unspecified complications: Secondary | ICD-10-CM | POA: Diagnosis not present

## 2022-01-25 DIAGNOSIS — F331 Major depressive disorder, recurrent, moderate: Secondary | ICD-10-CM | POA: Diagnosis not present

## 2022-01-25 DIAGNOSIS — K047 Periapical abscess without sinus: Secondary | ICD-10-CM

## 2022-01-25 DIAGNOSIS — I10 Essential (primary) hypertension: Secondary | ICD-10-CM | POA: Diagnosis not present

## 2022-01-25 DIAGNOSIS — E1165 Type 2 diabetes mellitus with hyperglycemia: Secondary | ICD-10-CM

## 2022-01-25 DIAGNOSIS — B379 Candidiasis, unspecified: Secondary | ICD-10-CM

## 2022-01-25 DIAGNOSIS — E559 Vitamin D deficiency, unspecified: Secondary | ICD-10-CM

## 2022-01-25 DIAGNOSIS — E782 Mixed hyperlipidemia: Secondary | ICD-10-CM

## 2022-01-25 DIAGNOSIS — R7989 Other specified abnormal findings of blood chemistry: Secondary | ICD-10-CM

## 2022-01-25 LAB — POCT GLYCOSYLATED HEMOGLOBIN (HGB A1C)

## 2022-01-25 MED ORDER — GLUCOSE BLOOD VI STRP
ORAL_STRIP | 2 refills | Status: DC
  Filled 2022-01-25: qty 100, 33d supply, fill #0

## 2022-01-25 MED ORDER — INSULIN LISPRO (1 UNIT DIAL) 100 UNIT/ML (KWIKPEN)
PEN_INJECTOR | SUBCUTANEOUS | 2 refills | Status: DC
  Filled 2022-01-25: qty 30, fill #0
  Filled 2022-05-05: qty 9, 25d supply, fill #0

## 2022-01-25 MED ORDER — DOXYCYCLINE HYCLATE 100 MG PO CAPS
100.0000 mg | ORAL_CAPSULE | Freq: Two times a day (BID) | ORAL | 0 refills | Status: DC
  Filled 2022-01-25: qty 20, 10d supply, fill #0

## 2022-01-25 MED ORDER — INSULIN LISPRO (1 UNIT DIAL) 100 UNIT/ML (KWIKPEN): PEN_INJECTOR | SUBCUTANEOUS | 0 refills | Status: DC

## 2022-01-25 MED ORDER — LISINOPRIL-HYDROCHLOROTHIAZIDE 20-25 MG PO TABS
ORAL_TABLET | ORAL | 0 refills | Status: DC
  Filled 2022-01-25: qty 30, 30d supply, fill #0

## 2022-01-25 MED ORDER — BASAGLAR KWIKPEN 100 UNIT/ML ~~LOC~~ SOPN: PEN_INJECTOR | SUBCUTANEOUS | 0 refills | Status: DC

## 2022-01-25 MED ORDER — BASAGLAR KWIKPEN 100 UNIT/ML ~~LOC~~ SOPN
PEN_INJECTOR | SUBCUTANEOUS | 2 refills | Status: DC
  Filled 2022-01-25: qty 15, fill #0

## 2022-01-25 MED ORDER — FLUCONAZOLE 150 MG PO TABS
150.0000 mg | ORAL_TABLET | Freq: Every day | ORAL | 0 refills | Status: DC
  Filled 2022-01-25: qty 1, 1d supply, fill #0

## 2022-01-25 NOTE — Progress Notes (Unsigned)
Established Patient Office Visit  Subjective   Patient ID: Barbara Wang, female    DOB: 01-28-1966  Age: 56 y.o. MRN: [id number]  Chief Complaint  Patient presents with   Medication Refill    Patient presents for medication refills.  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  States that she has difficulty with transportation.  Denies any hyperglycemic symptoms at this time  States that she did complete the trial of doxycycline after her last office visit in April with improvement, states that unfortunately the abscess has returned.  States that she has not been able to follow-up with dentistry.  States that she is only taking the gabapentin as needed, states that she does not feel she needs it 3 times a day anymore, no refill needed today   Past Medical History:  Diagnosis Date   Asthma    Brain tumor ([place])    Cellulitis 06/2015   rt hand    Depression    History of hiatal hernia    "it went away on it's own"   Hyperlipemia    Hypertension    Neuropathy     "feet & hands "   Obesity    Pancreatitis    Type II diabetes mellitus (HCC)    insulin dependent    Social History   Socioeconomic History   Marital status: Single    Spouse name: Not on file   Number of children: 2   Years of education: Not on file   Highest education level: High school graduate  Occupational History   Not on file  Tobacco Use   Smoking status: Every Day    Packs/day: 1.00    Years: 40.00    Pack years: 40.00    Types: Cigarettes   Smokeless tobacco: Never  Vaping Use   Vaping Use: Never used  Substance and Sexual Activity   Alcohol use: No   Drug use: No   Sexual activity: Yes    Birth control/protection: Surgical  Other Topics Concern   Not on file  Social History Narrative   Not on file   Social Determinants of Health   Financial Resource Strain: Not on file  Food Insecurity: Not on  file  Transportation Needs: Not on file  Physical Activity: Not on file  Stress: Not on file  Social Connections: Not on file  Intimate Partner Violence: Not on file   Family History  Problem Relation Age of Onset   Stroke Mother    Breast cancer Mother 46   Diabetes Father    Diabetes Sister    Breast cancer Maternal Aunt    Breast cancer Maternal Aunt    Breast cancer Maternal Grandmother    Allergies  Allergen Reactions   Penicillins Hives and Shortness Of Breath    Has patient had a PCN reaction causing immediate rash, facial/tongue/throat swelling, SOB or lightheadedness with hypotension: Yes Has patient had a PCN reaction causing severe rash involving mucus membranes or skin necrosis: No Has patient had a PCN reaction that required hospitalization No Has patient had a PCN reaction occurring within the last 10 years: No If all of the above answers are "NO", then may proceed with Cephalosporin use.   Azithromycin Hives   Erythromycin Base Other (See Comments)    Unknown   Sulfa Antibiotics Hives   Tramadol Hives      Review of Systems  Constitutional: Negative.   HENT: Negative.    Eyes: Negative.   Respiratory:  Negative for shortness of breath.   Cardiovascular:  Negative for chest pain.  Gastrointestinal: Negative.   Genitourinary: Negative.   Musculoskeletal: Negative.   Skin: Negative.   Neurological: Negative.   Endo/Heme/Allergies: Negative.   Psychiatric/Behavioral: Negative.       Objective:     BP 128/77 (BP Location: Right Arm, Patient Position: Sitting, Cuff Size: Normal)   Pulse (!) 107   Temp 98 F (36.7 C) (Oral)   Resp 18   Ht '5\' 4"'$  (1.626 m)   Wt 173 lb (78.5 kg)   SpO2 96%   BMI 29.70 kg/m    Physical Exam Vitals and nursing note reviewed.  Constitutional:      General: She is not in acute distress.    Appearance: Normal appearance. She is not ill-appearing.  HENT:     Head: Normocephalic and atraumatic.     Right Ear:  External ear normal.     Left Ear: External ear normal.     Nose: Nose normal.     Mouth/Throat:     Mouth: Mucous membranes are moist.     Dentition: Abnormal dentition. Dental tenderness, dental caries and dental abscesses present.     Pharynx: Oropharynx is clear.  Eyes:     Extraocular Movements: Extraocular movements intact.     Conjunctiva/sclera: Conjunctivae normal.     Pupils: Pupils are equal, round, and reactive to light.  Cardiovascular:     Rate and Rhythm: Normal rate and regular rhythm.     Pulses: Normal pulses.     Heart sounds: Normal heart sounds.  Pulmonary:     Effort: Pulmonary effort is normal.     Breath sounds: Normal breath sounds.  Musculoskeletal:        General: Normal range of motion.     Cervical back: Normal range of motion and neck supple.  Skin:    General: Skin is warm and dry.  Neurological:     General: No focal deficit present.     Mental Status: She is alert and oriented to person, place, and time.  Psychiatric:        Mood and Affect: Mood normal.        Behavior: Behavior normal.        Thought Content: Thought content normal.        Judgment: Judgment normal.     Assessment & Plan:   Problem List Items Addressed This Visit       Cardiovascular and Mediastinum   Essential hypertension   Relevant Medications   lisinopril-hydrochlorothiazide (ZESTORETIC) 20-25 MG tablet     Endocrine   Poorly controlled type 2 diabetes mellitus with complication (HCC) - Primary   Relevant Medications   lisinopril-hydrochlorothiazide (ZESTORETIC) 20-25 MG tablet   Insulin Glargine (BASAGLAR KWIKPEN) 100 UNIT/ML   insulin lispro (HUMALOG) 100 UNIT/ML KwikPen   Other Relevant Orders   HgB A1c (Completed)     Other   Mixed hyperlipidemia   Relevant Medications   lisinopril-hydrochlorothiazide (ZESTORETIC) 20-25 MG tablet   Other Visit Diagnoses     Uncontrolled type 2 diabetes mellitus with hyperglycemia (HCC)       Relevant Medications    lisinopril-hydrochlorothiazide (ZESTORETIC) 20-25 MG tablet   glucose blood test strip   Insulin Glargine (BASAGLAR KWIKPEN) 100 UNIT/ML   insulin lispro (HUMALOG) 100 UNIT/ML KwikPen   Other Relevant Orders   Basic Metabolic Panel (Completed)   Dental  abscess       Relevant Medications   doxycycline (VIBRAMYCIN) 100 MG capsule   Moderate episode of recurrent major depressive disorder (HCC)       Vitamin D deficiency       Abnormal thyroid blood test       Antibiotic-induced yeast infection       Relevant Medications   fluconazole (DIFLUCAN) 150 MG tablet     1. Poorly controlled type 2 diabetes mellitus with complication (HCC) H2D 92.4; patient strongly encourage compliance to medications, patient encouraged compliance to appointments.  Patient encouraged to check blood glucose levels at home, keep a written log and have available for all office visits.  Red flags given for prompt reevaluation - HgB A1c  2. Uncontrolled type 2 diabetes mellitus with hyperglycemia (HCC)  - Basic Metabolic Panel - glucose blood test strip; Use to check blood sugar three times daily.  Dispense: 100 each; Refill: 2 - Insulin Glargine (BASAGLAR KWIKPEN) 100 UNIT/ML; Inject 25 units subcutaneously in the morning and 35 units subcutaneously in the evening.  Dispense: 15 mL; Refill: 2 - insulin lispro (HUMALOG) 100 UNIT/ML KwikPen; INJECT 12 UNITS INTO THE SKIN 3 (THREE) TIMES DAILY WITH MEALS.  Dispense: 30 mL; Refill: 2  3. Dental abscess Trial of doxycycline.  Patient strongly encouraged to follow-up with dentistry as soon as possible - doxycycline (VIBRAMYCIN) 100 MG capsule; Take 1 capsule (100 mg total) by mouth 2 (two) times daily.  Dispense: 20 capsule; Refill: 0  4. Essential hypertension Continue current regimen - lisinopril-hydrochlorothiazide (ZESTORETIC) 20-25 MG tablet; Take 1 tablet by mouth daily for 30 days.  Dispense: 30 tablet; Refill: 0  5. Moderate episode of recurrent major  depressive disorder (HCC) Continue current regimen no refill needed today  6. Vitamin D deficiency   7. Abnormal thyroid blood test Follow up lab today  8. Mixed hyperlipidemia   9. Antibiotic-induced yeast infection Trial diflucan - fluconazole (DIFLUCAN) 150 MG tablet; Take 1 tablet (150 mg total) by mouth daily.  Dispense: 1 tablet; Refill: 0    I have reviewed the patient's medical history (PMH, PSH, Social History, Family History, Medications, and allergies) , and have been updated if relevant. I spent 30 minutes reviewing chart and  face to face time with patient.    Return in about 4 weeks (around 02/22/2022) for with Zelda.    Loraine Grip Mayers, PA-C

## 2022-01-25 NOTE — Patient Instructions (Signed)
Your A1c is 13.4, this is still way too high.  I strongly encourage you to make sure that you do not run out of your medications.  I encourage you to continue checking your blood glucose at home, keeping a written log and having available for all office visits.  It is very important that you keep your follow-up appointment with Zelda for further evaluation.  You are going to start taking vitamin D once a week for 12 weeks.  We will call you with today's lab results.  Kennieth Rad, PA-C Physician Assistant [place] [email or web address]   Hyperglycemia Hyperglycemia occurs when the level of sugar (glucose) in the blood is too high. Glucose is a type of sugar that provides the body's main source of energy. Certain hormones (insulin and glucagon) control the level of glucose in the blood. Insulin lowers blood glucose, and glucagon increases blood glucose. Hyperglycemia can result from not having enough insulin in the bloodstream, or from the body not responding normally to insulin. Hyperglycemia occurs most often in people who have diabetes (diabetes mellitus), but it can happen in people who do not have diabetes. It can develop quickly, and it can be life-threatening if it causes you to become severely dehydrated (diabetic ketoacidosis or hyperglycemic hyperosmolar state). Severe hyperglycemia is a medical emergency. For most people with diabetes, a blood glucose level above 240 mg/dL is considered hyperglycemia. What are the causes? If you have diabetes, hyperglycemia may be caused by: Medicines that increase blood glucose or affect your diabetes control. Getting less physical activity. Eating more than planned. Being sick or injured, having an infection, or having surgery. Stress. Not giving yourself enough insulin (if you are taking insulin). If you have undiagnosed diabetes, this may be the reason you have hyperglycemia. If you do  not have diabetes, hyperglycemia may be caused by: Certain medicines, including: Steroid medicines. Beta-blockers. Epinephrine. Thiazide diuretics. Stress. Having a serious illness, an infection, or surgery. Diseases of the pancreas. What increases the risk? Hyperglycemia is more likely to develop in people who have risk factors for diabetes, such as: Having a family member with diabetes. Certain conditions in which the body's disease-fighting system (immune system) attacks itself (autoimmune disorders). Being overweight or obese. Having an inactive (sedentary) lifestyle. Having been diagnosed with insulin resistance. Having a history of prediabetes, gestational diabetes, or polycystic ovarian syndrome (PCOS). What are the signs or symptoms? Hyperglycemia may not cause any symptoms. If you do have symptoms, they may include: Increased thirst. Needing to urinate more often than usual. Hunger. Feeling very tired. Blurry vision. Other symptoms may develop if hyperglycemia gets worse, such as: Dry mouth. Abdominal pain. Loss of appetite. Fruity-smelling breath. Weakness. Unexpected weight loss. Tingling or numbness in the hands or feet. Headache. Cuts or bruises that are slow to heal. How is this diagnosed? Hyperglycemia is diagnosed with a blood test to measure your blood glucose level. This blood test is usually done while you are having symptoms. Your health care provider may also do a physical exam and review your medical history. You may have more tests to determine the cause of your hyperglycemia, such as: A fasting blood glucose (FBG) test. You will not be allowed to eat (you will fast) for at least 8 hours before a blood sample is taken. An A1C blood test. This provides information about blood glucose control over the previous 2-3 months. An oral glucose tolerance test (OGTT). This measures your blood glucose at two times: After fasting. This is  your baseline blood glucose  level. 2 hours after drinking a beverage that contains glucose. How is this treated? Treatment depends on the cause of your hyperglycemia. Treatment may include: Taking medicine to regulate your blood glucose levels. If you take insulin or other diabetes medicines, your medicine or dosage may be adjusted. Lifestyle changes, such as exercising more, eating healthier foods, or losing weight. Treating an illness or infection. Checking your blood glucose more often. Stopping or reducing steroid medicines. If your hyperglycemia becomes severe and it results in diabetic ketoacidosis or hyperglycemic hyperosmolar state, you must be hospitalized and given IV fluids and IV insulin. Follow these instructions at home: General instructions Take over-the-counter and prescription medicines only as told by your health care provider. Do not use any products that contain nicotine or tobacco. These products include cigarettes, chewing tobacco, and vaping devices, such as e-cigarettes. If you need help quitting, ask your health care provider. If you drink alcohol: Limit how much you have to: 0-1 drink a day for women who are not pregnant. 0-2 drinks a day for men. Know how much alcohol is in a drink. In the [place] one drink equals one 12 oz bottle of beer (355 mL), one 5 oz glass of wine (148 mL), or one 1 oz glass of hard liquor (44 mL). Learn to manage stress. If you need help with this, ask your health care provider. Do exercises as told by your health care provider. Keep all follow-up visits. This is important. Eating and drinking  Maintain a healthy weight. Stay hydrated, especially when you exercise, get sick, or spend time in hot temperatures. Drink enough fluid to keep your urine pale yellow. If you have diabetes:  Know the symptoms of hyperglycemia. Follow your diabetes management plan as told by your health care provider. Make sure you: Take your insulin and medicines as told. Follow your  exercise plan. Follow your meal plan. Eat on time, and do not skip meals. Check your blood glucose as often as told. Make sure to check your blood glucose before and after exercise. If you exercise longer or in a different way, check your blood glucose more often. Follow your sick day plan whenever you cannot eat or drink normally. Make this plan in advance with your health care provider. Share your diabetes management plan with people in your workplace, school, and household. Check your urine for ketones when you are ill and as told by your health care provider. Carry a medical alert card or wear medical alert jewelry. Where to find more information American Diabetes Association: www.diabetes.org Contact a health care provider if: Your blood glucose is at or above 240 mg/dL (13.3 mmol/L) for 2 days in a row. You have problems keeping your blood glucose in your target range. You have frequent episodes of hyperglycemia. You have signs of illness, such as nausea, vomiting, or fever. Get help right away if: Your blood glucose monitor reads "high" even when you are taking insulin. You have trouble breathing. You have a change in how you think, feel, or act (mental status). You have nausea or vomiting that does not go away. These symptoms may represent a serious problem that is an emergency. Do not wait to see if the symptoms will go away. Get medical help right away. Call your local emergency services (911 in the [place] Do not drive yourself to the hospital. Summary Hyperglycemia occurs when the level of sugar (glucose) in the blood is too high. Hyperglycemia can happen with or  without diabetes, and severe hyperglycemia can be life-threatening. Hyperglycemia is diagnosed with a blood test to measure your blood glucose level. This blood test is usually done while you are having symptoms. Your health care provider may also do a physical exam and review your medical history. If you have diabetes,  follow your diabetes management plan as told by your health care provider. Contact your health care provider if you have problems keeping your blood glucose in your target range. This information is not intended to replace advice given to you by your health care provider. Make sure you discuss any questions you have with your health care provider. Document Revised: 05/28/2020 Document Reviewed: 05/28/2020 Elsevier Patient Education  [place].

## 2022-01-25 NOTE — Progress Notes (Unsigned)
Patient has eaten and taken medication today.

## 2022-01-26 ENCOUNTER — Ambulatory Visit: Payer: Self-pay | Admitting: *Deleted

## 2022-01-26 ENCOUNTER — Telehealth: Payer: Self-pay

## 2022-01-26 DIAGNOSIS — B379 Candidiasis, unspecified: Secondary | ICD-10-CM | POA: Insufficient documentation

## 2022-01-26 DIAGNOSIS — E559 Vitamin D deficiency, unspecified: Secondary | ICD-10-CM | POA: Insufficient documentation

## 2022-01-26 DIAGNOSIS — F331 Major depressive disorder, recurrent, moderate: Secondary | ICD-10-CM | POA: Insufficient documentation

## 2022-01-26 LAB — BASIC METABOLIC PANEL
BUN/Creatinine Ratio: 19 (ref 9–23)
BUN: 20 mg/dL (ref 6–24)
CO2: 24 mmol/L (ref 20–29)
Calcium: 9.4 mg/dL (ref 8.7–10.2)
Chloride: 96 mmol/L (ref 96–106)
Creatinine, Ser: 1.03 mg/dL — ABNORMAL HIGH (ref 0.57–1.00)
Glucose: 664 mg/dL (ref 70–99)
Potassium: 4.5 mmol/L (ref 3.5–5.2)
Sodium: 136 mmol/L (ref 134–144)
eGFR: 63 mL/min/{1.73_m2} (ref >59)

## 2022-01-26 NOTE — Telephone Encounter (Signed)
-----   Message from Kennieth Rad, [place] sent at 01/26/2022 12:09 PM EDT ----- Please call patient and let her know that her blood glucose yesterday was very elevated.  Please verify that she did pick up her medication and testing strips and find out what her current blood glucose levels are.  She did have a future lab for thyroid follow-up that needs to be added onto this lab draw please

## 2022-01-26 NOTE — Telephone Encounter (Signed)
Call received from Summer from [employer]. Stat lab results from 01/25/22 collection glucose 664 from BMP. Office at lunch at this time. Will send high priority to  office for PCP for review .

## 2022-01-27 NOTE — Telephone Encounter (Signed)
Patients voicemail is full

## 2022-03-02 ENCOUNTER — Ambulatory Visit: Payer: Commercial Managed Care - HMO | Attending: Nurse Practitioner | Admitting: Pharmacist

## 2022-03-02 ENCOUNTER — Other Ambulatory Visit: Payer: Self-pay

## 2022-03-02 DIAGNOSIS — E1165 Type 2 diabetes mellitus with hyperglycemia: Secondary | ICD-10-CM

## 2022-03-02 MED ORDER — ONETOUCH VERIO W/DEVICE KIT
PACK | 0 refills | Status: DC
  Filled 2022-03-02: qty 1, 30d supply, fill #0

## 2022-03-02 MED ORDER — BASAGLAR KWIKPEN 100 UNIT/ML ~~LOC~~ SOPN
60.0000 [IU] | PEN_INJECTOR | Freq: Every day | SUBCUTANEOUS | 2 refills | Status: DC
  Filled 2022-03-02: qty 15, 25d supply, fill #0

## 2022-03-02 MED ORDER — ONETOUCH DELICA LANCETS 33G MISC
2 refills | Status: DC
  Filled 2022-03-02: qty 100, 30d supply, fill #0

## 2022-03-02 MED ORDER — BASAGLAR KWIKPEN 100 UNIT/ML ~~LOC~~ SOPN
60.0000 [IU] | PEN_INJECTOR | Freq: Every day | SUBCUTANEOUS | 2 refills | Status: DC
  Filled 2022-03-02: qty 15, 25d supply, fill #0
  Filled 2022-03-31 (×2): qty 15, 25d supply, fill #1
  Filled 2022-05-05: qty 15, 25d supply, fill #2

## 2022-03-02 MED ORDER — ACCU-CHEK GUIDE VI STRP
ORAL_STRIP | 2 refills | Status: DC
  Filled 2022-03-02: qty 100, fill #0

## 2022-03-02 MED ORDER — ACCU-CHEK GUIDE W/DEVICE KIT
PACK | 0 refills | Status: DC
  Filled 2022-03-02: qty 1, 30d supply, fill #0
  Filled 2022-03-02: qty 1, fill #0

## 2022-03-02 MED ORDER — ONETOUCH VERIO VI STRP
ORAL_STRIP | 2 refills | Status: DC
  Filled 2022-03-02: qty 100, 30d supply, fill #0
  Filled 2022-06-26: qty 100, 30d supply, fill #1
  Filled 2022-08-22 (×2): qty 100, 30d supply, fill #2

## 2022-03-02 NOTE — Progress Notes (Signed)
    S:     No chief complaint on file.  Barbara Wang is a 56 y.o. female who presents for diabetes evaluation, education, and management. PMH is significant for HTN, T2DM w/ hx DKA, asthma, obesity, HLD, MDD. Patient was referred by Carrolyn Meiers on 01/25/2022. At last visit, pt's A1c was >15 and CBG was >600. Of note, she reported being without Basaglar for some time. She was hospitalized earlier this year from 2/3-10/06/2021 with AMS. Found to be in DKA. Was discharged on insulin.  No hx of ACS, stroke, CHF, CKD.   No thyroid cancer. Endorses a hx of pancreatitis.   Patient reports Diabetes is longstanding.   Family/Social History:  -Fhx: stroke, DM -Tobacco: current 1 PPD smoker  -Alcohol: none reported   Current diabetes medications include: Basaglar 25/35 units BID, Humalog 12u TID (before meals)  Patient reports adherence to taking all medications as prescribed.   Insurance coverage: [employer]  Patient denies hypoglycemic events.  Reported home fasting blood sugars: not checking   Reported 2 hour post-meal/random blood sugars: not checking.  Patient reports polyuria. Patient reports neuropathy (nerve pain). Patient reports visual changes. Patient denies self foot exams.   Patient reported dietary habits:  -Admits to eating sugary fruits -Admits to eating bread, pasta salad  -Eats what she can based on limited income  Patient-reported exercise habits: none   O:   ROS  Physical Exam  7 day average blood glucose: no meter with her    Lab Results  Component Value Date   HGBA1C  01/25/2022     Comment:     >15   There were no vitals filed for this visit.  Lipid Panel     Component Value Date/Time   CHOL 141 12/06/2021 1042   TRIG 84 12/06/2021 1042   HDL 59 12/06/2021 1042   CHOLHDL 2.4 12/06/2021 1042   CHOLHDL 5.0 01/22/2016 1044   VLDL 25 01/22/2016 1044   LDLCALC 66 12/06/2021 1042    Clinical Atherosclerotic Cardiovascular Disease (ASCVD):  No  The 10-year ASCVD risk score (Arnett DK, et al., 2019) is: 18.3%   Values used to calculate the score:     Age: 26 years     Sex: Female     Is Non-Hispanic African American: Yes     Diabetic: Yes     Tobacco smoker: Yes     Systolic Blood Pressure: 740 mmHg     Is BP treated: Yes     HDL Cholesterol: 59 mg/dL     Total Cholesterol: 141 mg/dL   A/P: Diabetes longstanding currently severely uncontrolled. Patient is able to verbalize appropriate hypoglycemia management plan. Medication adherence appears appropriate but we have no home data. Control is also suboptimal d/t physical inactivity and dietary indiscretion.  - Changed  Basaglar to 60u in the morning. Continue Humalog dose for now.  -Testing supplies sent. Encouraged pt to start testing at home.  -Extensively discussed pathophysiology of diabetes, recommended lifestyle interventions, dietary effects on blood sugar control.  -Counseled on s/sx of and management of hypoglycemia.  -Next A1c anticipated 03/2022.   Written patient instructions provided. Patient verbalized understanding of treatment plan.  Total time in face to face counseling 30 minutes.    Follow-up:  Pharmacist 1 month. PCP clinic visit tomorrow.  Benard Halsted, PharmD, [place], [place] [phone]

## 2022-03-03 ENCOUNTER — Ambulatory Visit: Payer: Self-pay | Admitting: Nurse Practitioner

## 2022-03-24 ENCOUNTER — Other Ambulatory Visit (HOSPITAL_COMMUNITY): Payer: Self-pay

## 2022-03-31 ENCOUNTER — Other Ambulatory Visit: Payer: Self-pay

## 2022-03-31 ENCOUNTER — Other Ambulatory Visit: Payer: Self-pay | Admitting: Physician Assistant

## 2022-03-31 DIAGNOSIS — I10 Essential (primary) hypertension: Secondary | ICD-10-CM

## 2022-03-31 MED ORDER — LISINOPRIL-HYDROCHLOROTHIAZIDE 20-25 MG PO TABS
ORAL_TABLET | ORAL | 0 refills | Status: DC
  Filled 2022-03-31 (×2): qty 30, 30d supply, fill #0
  Filled 2022-05-05: qty 30, 30d supply, fill #1
  Filled 2022-06-26: qty 30, 30d supply, fill #2

## 2022-03-31 NOTE — Telephone Encounter (Signed)
Requested Prescriptions  Pending Prescriptions Disp Refills  . lisinopril-hydrochlorothiazide (ZESTORETIC) 20-25 MG tablet 90 tablet 0    Sig: Take 1 tablet by mouth daily for 30 days.     Cardiovascular:  ACEI + Diuretic Combos Failed - 03/31/2022 11:14 AM      Failed - Cr in normal range and within 180 days    Creat  Date Value Ref Range Status  06/11/2017 0.88 0.50 - 1.05 mg/dL Final    Comment:    For patients >58 years of age, the reference limit for Creatinine is approximately 13% higher for people identified as African-American. .    Creatinine, Ser  Date Value Ref Range Status  01/25/2022 1.03 (H) 0.57 - 1.00 mg/dL Final   Creatinine, Urine  Date Value Ref Range Status  02/02/2016 128 20 - 320 mg/dL Final         Passed - Na in normal range and within 180 days    Sodium  Date Value Ref Range Status  01/25/2022 136 134 - 144 mmol/L Final         Passed - K in normal range and within 180 days    Potassium  Date Value Ref Range Status  01/25/2022 4.5 3.5 - 5.2 mmol/L Final         Passed - eGFR is 30 or above and within 180 days    GFR, Est African American  Date Value Ref Range Status  09/18/2016 >89 >=60 mL/min Final   GFR calc Af Amer  Date Value Ref Range Status  01/17/2020 >60 >60 mL/min Final   GFR, Est Non African American  Date Value Ref Range Status  09/18/2016 78 >=60 mL/min Final   GFR calc non Af Amer  Date Value Ref Range Status  01/17/2020 >60 >60 mL/min Final   eGFR  Date Value Ref Range Status  01/25/2022 63 >59 mL/min/1.73 Final         Passed - Patient is not pregnant      Passed - Last BP in normal range    BP Readings from Last 1 Encounters:  01/25/22 128/77         Passed - Valid encounter within last 6 months    Recent Outpatient Visits          4 weeks ago Uncontrolled type 2 diabetes mellitus with hyperglycemia [facility])   [place], Jarome Matin, RPH-CPP   2 months ago Poorly  controlled type 2 diabetes mellitus with complication [facility])   [facility], Cari S, PA-C   3 months ago Poorly controlled type 2 diabetes mellitus with complication [facility])   [facility] at [facility], Cari S, PA-C   10 months ago Uncontrolled type 2 diabetes mellitus with hyperglycemia [facility])   [place], [place] W, NP   2 years ago Uncontrolled type 2 diabetes mellitus with hyperglycemia [facility])   [place], Zelda W, NP      Future Appointments            In 2 weeks [name] Blossom, Jarome Matin, [place]

## 2022-04-18 ENCOUNTER — Ambulatory Visit: Payer: Commercial Managed Care - HMO | Admitting: Pharmacist

## 2022-05-05 ENCOUNTER — Other Ambulatory Visit: Payer: Self-pay

## 2022-05-14 ENCOUNTER — Encounter (HOSPITAL_COMMUNITY): Payer: Self-pay | Admitting: Emergency Medicine

## 2022-05-14 ENCOUNTER — Inpatient Hospital Stay (HOSPITAL_COMMUNITY)
Admission: EM | Admit: 2022-05-14 | Discharge: 2022-05-17 | DRG: 638 | Disposition: A | Discharge status: Home Health | Payer: Commercial Managed Care - HMO | Attending: Internal Medicine | Admitting: Internal Medicine

## 2022-05-14 DIAGNOSIS — I10 Essential (primary) hypertension: Secondary | ICD-10-CM | POA: Diagnosis present

## 2022-05-14 DIAGNOSIS — F32A Depression, unspecified: Secondary | ICD-10-CM | POA: Diagnosis present

## 2022-05-14 DIAGNOSIS — E669 Obesity, unspecified: Secondary | ICD-10-CM | POA: Diagnosis present

## 2022-05-14 DIAGNOSIS — Z880 Allergy status to penicillin: Secondary | ICD-10-CM

## 2022-05-14 DIAGNOSIS — D649 Anemia, unspecified: Secondary | ICD-10-CM | POA: Diagnosis present

## 2022-05-14 DIAGNOSIS — Z6829 Body mass index (BMI) 29.0-29.9, adult: Secondary | ICD-10-CM

## 2022-05-14 DIAGNOSIS — Z90710 Acquired absence of both cervix and uterus: Secondary | ICD-10-CM

## 2022-05-14 DIAGNOSIS — E1100 Type 2 diabetes mellitus with hyperosmolarity without nonketotic hyperglycemic-hyperosmolar coma (NKHHC): Principal | ICD-10-CM

## 2022-05-14 DIAGNOSIS — Z91119 Patient's noncompliance with dietary regimen due to unspecified reason: Secondary | ICD-10-CM

## 2022-05-14 DIAGNOSIS — F17210 Nicotine dependence, cigarettes, uncomplicated: Secondary | ICD-10-CM | POA: Diagnosis present

## 2022-05-14 DIAGNOSIS — Z823 Family history of stroke: Secondary | ICD-10-CM

## 2022-05-14 DIAGNOSIS — R319 Hematuria, unspecified: Secondary | ICD-10-CM | POA: Diagnosis present

## 2022-05-14 DIAGNOSIS — E1110 Type 2 diabetes mellitus with ketoacidosis without coma: Secondary | ICD-10-CM | POA: Diagnosis present

## 2022-05-14 DIAGNOSIS — E1165 Type 2 diabetes mellitus with hyperglycemia: Secondary | ICD-10-CM | POA: Diagnosis present

## 2022-05-14 DIAGNOSIS — N179 Acute kidney failure, unspecified: Secondary | ICD-10-CM

## 2022-05-14 DIAGNOSIS — Z91199 Patient's noncompliance with other medical treatment and regimen due to unspecified reason: Secondary | ICD-10-CM

## 2022-05-14 DIAGNOSIS — K219 Gastro-esophageal reflux disease without esophagitis: Secondary | ICD-10-CM | POA: Diagnosis present

## 2022-05-14 DIAGNOSIS — Z803 Family history of malignant neoplasm of breast: Secondary | ICD-10-CM

## 2022-05-14 DIAGNOSIS — E860 Dehydration: Secondary | ICD-10-CM | POA: Diagnosis present

## 2022-05-14 DIAGNOSIS — Z833 Family history of diabetes mellitus: Secondary | ICD-10-CM

## 2022-05-14 DIAGNOSIS — Z66 Do not resuscitate: Secondary | ICD-10-CM | POA: Diagnosis present

## 2022-05-14 DIAGNOSIS — E1142 Type 2 diabetes mellitus with diabetic polyneuropathy: Secondary | ICD-10-CM | POA: Diagnosis present

## 2022-05-14 DIAGNOSIS — Z794 Long term (current) use of insulin: Secondary | ICD-10-CM

## 2022-05-14 DIAGNOSIS — Z713 Dietary counseling and surveillance: Secondary | ICD-10-CM

## 2022-05-14 DIAGNOSIS — Z79899 Other long term (current) drug therapy: Secondary | ICD-10-CM

## 2022-05-14 DIAGNOSIS — E782 Mixed hyperlipidemia: Secondary | ICD-10-CM | POA: Diagnosis present

## 2022-05-14 DIAGNOSIS — N390 Urinary tract infection, site not specified: Secondary | ICD-10-CM

## 2022-05-14 DIAGNOSIS — R739 Hyperglycemia, unspecified: Principal | ICD-10-CM

## 2022-05-14 DIAGNOSIS — Z7982 Long term (current) use of aspirin: Secondary | ICD-10-CM

## 2022-05-14 DIAGNOSIS — J45909 Unspecified asthma, uncomplicated: Secondary | ICD-10-CM | POA: Diagnosis present

## 2022-05-14 DIAGNOSIS — Z885 Allergy status to narcotic agent status: Secondary | ICD-10-CM

## 2022-05-14 DIAGNOSIS — Z881 Allergy status to other antibiotic agents status: Secondary | ICD-10-CM

## 2022-05-14 DIAGNOSIS — Z9049 Acquired absence of other specified parts of digestive tract: Secondary | ICD-10-CM

## 2022-05-14 LAB — BASIC METABOLIC PANEL
Anion gap: 13 (ref 5–15)
BUN: 40 mg/dL — ABNORMAL HIGH (ref 6–20)
CO2: 25 mmol/L (ref 22–32)
Calcium: 9.3 mg/dL (ref 8.9–10.3)
Chloride: 87 mmol/L — ABNORMAL LOW (ref 98–111)
Creatinine, Ser: 2.65 mg/dL — ABNORMAL HIGH (ref 0.44–1.00)
GFR, Estimated: 20 mL/min — ABNORMAL LOW (ref >60)
Glucose, Bld: 830 mg/dL (ref 70–99)
Potassium: 5 mmol/L (ref 3.5–5.1)
Sodium: 125 mmol/L — ABNORMAL LOW (ref 135–145)

## 2022-05-14 LAB — CBC
HCT: 43 % (ref 36.0–46.0)
Hemoglobin: 14.4 g/dL (ref 12.0–15.0)
MCH: 29.8 pg (ref 26.0–34.0)
MCHC: 33.5 g/dL (ref 30.0–36.0)
MCV: 88.8 fL (ref 80.0–100.0)
Platelets: 363 10*3/uL (ref 150–400)
RBC: 4.84 MIL/uL (ref 3.87–5.11)
RDW: 12.3 % (ref 11.5–15.5)
WBC: 8 10*3/uL (ref 4.0–10.5)
nRBC: 0 % (ref 0.0–0.2)

## 2022-05-14 LAB — URINALYSIS, ROUTINE W REFLEX MICROSCOPIC
Bilirubin Urine: NEGATIVE
Glucose, UA: >=500 mg/dL — AB
Ketones, ur: NEGATIVE mg/dL
Nitrite: NEGATIVE
Protein, ur: 30 mg/dL — AB
Specific Gravity, Urine: 1.018 (ref 1.005–1.030)
WBC, UA: >50 WBC/hpf — ABNORMAL HIGH (ref 0–5)
pH: 5 (ref 5.0–8.0)

## 2022-05-14 LAB — HEPATIC FUNCTION PANEL
ALT: 14 U/L (ref 0–44)
AST: 15 U/L (ref 15–41)
Albumin: 3.5 g/dL (ref 3.5–5.0)
Alkaline Phosphatase: 118 U/L (ref 38–126)
Bilirubin, Direct: 0.2 mg/dL (ref 0.0–0.2)
Indirect Bilirubin: 0.2 mg/dL — ABNORMAL LOW (ref 0.3–0.9)
Total Bilirubin: 0.4 mg/dL (ref 0.3–1.2)
Total Protein: 7.8 g/dL (ref 6.5–8.1)

## 2022-05-14 LAB — I-STAT VENOUS BLOOD GAS, ED
Acid-Base Excess: 4 mmol/L — ABNORMAL HIGH (ref 0.0–2.0)
Bicarbonate: 29.7 mmol/L — ABNORMAL HIGH (ref 20.0–28.0)
Calcium, Ion: 1.09 mmol/L — ABNORMAL LOW (ref 1.15–1.40)
HCT: 45 % (ref 36.0–46.0)
Hemoglobin: 15.3 g/dL — ABNORMAL HIGH (ref 12.0–15.0)
O2 Saturation: 56 %
Potassium: 5 mmol/L (ref 3.5–5.1)
Sodium: 125 mmol/L — ABNORMAL LOW (ref 135–145)
TCO2: 31 mmol/L (ref 22–32)
pCO2, Ven: 49.6 mmHg (ref 44–60)
pH, Ven: 7.386 (ref 7.25–7.43)
pO2, Ven: 30 mmHg — CL (ref 32–45)

## 2022-05-14 LAB — BETA-HYDROXYBUTYRIC ACID: Beta-Hydroxybutyric Acid: 0.19 mmol/L (ref 0.05–0.27)

## 2022-05-14 LAB — CBG MONITORING, ED
Glucose-Capillary: >600 mg/dL (ref 70–99)
Glucose-Capillary: >600 mg/dL (ref 70–99)
Glucose-Capillary: >600 mg/dL (ref 70–99)
Glucose-Capillary: >600 mg/dL (ref 70–99)
Glucose-Capillary: 494 mg/dL — ABNORMAL HIGH (ref 70–99)

## 2022-05-14 MED ORDER — LACTATED RINGERS IV SOLN: INTRAVENOUS | Status: DC

## 2022-05-14 MED ORDER — DEXTROSE IN LACTATED RINGERS 5 % IV SOLN: INTRAVENOUS | Status: DC

## 2022-05-14 MED ORDER — SODIUM CHLORIDE 0.9 % IV SOLN
1.0000 g | Freq: Once | INTRAVENOUS | Status: AC
  Administered 2022-05-14: 1 g via INTRAVENOUS
  Filled 2022-05-14: qty 10

## 2022-05-14 MED ORDER — SODIUM CHLORIDE 0.9 % IV BOLUS
2000.0000 mL | Freq: Once | INTRAVENOUS | Status: AC
  Administered 2022-05-14: 2000 mL via INTRAVENOUS

## 2022-05-14 MED ORDER — SODIUM CHLORIDE 0.9 % IV SOLN: 1.0000 g | INTRAVENOUS | Status: DC

## 2022-05-14 MED ORDER — HEPARIN SODIUM (PORCINE) 5000 UNIT/ML IJ SOLN
5000.0000 [IU] | Freq: Three times a day (TID) | INTRAMUSCULAR | Status: DC
  Administered 2022-05-15 – 2022-05-17 (×8): 5000 [IU] via SUBCUTANEOUS
  Filled 2022-05-14 (×7): qty 1

## 2022-05-14 MED ORDER — INSULIN ASPART 100 UNIT/ML IJ SOLN
10.0000 [IU] | Freq: Once | INTRAMUSCULAR | Status: AC
  Administered 2022-05-14: 10 [IU] via SUBCUTANEOUS

## 2022-05-14 MED ORDER — INSULIN REGULAR(HUMAN) IN NACL 100-0.9 UT/100ML-% IV SOLN: INTRAVENOUS | Status: DC

## 2022-05-14 MED ORDER — SODIUM CHLORIDE 0.9 % IV SOLN: 2.0000 g | INTRAVENOUS | Status: DC

## 2022-05-14 MED ORDER — DEXTROSE 50 % IV SOLN: 0.0000 mL | INTRAVENOUS | Status: DC | PRN

## 2022-05-14 MED ORDER — ACETAMINOPHEN 650 MG RE SUPP: 650.0000 mg | Freq: Four times a day (QID) | RECTAL | Status: DC | PRN

## 2022-05-14 MED ORDER — SODIUM CHLORIDE 0.9 % IV BOLUS
1000.0000 mL | Freq: Once | INTRAVENOUS | Status: AC
  Administered 2022-05-14: 1000 mL via INTRAVENOUS

## 2022-05-14 MED ORDER — ACETAMINOPHEN 325 MG PO TABS
650.0000 mg | ORAL_TABLET | Freq: Four times a day (QID) | ORAL | Status: DC | PRN
  Administered 2022-05-16 (×2): 650 mg via ORAL
  Filled 2022-05-14 (×3): qty 2

## 2022-05-14 MED ORDER — INSULIN REGULAR(HUMAN) IN NACL 100-0.9 UT/100ML-% IV SOLN
INTRAVENOUS | Status: DC
  Administered 2022-05-15: 11.4 [IU]/h via INTRAVENOUS
  Filled 2022-05-14 (×2): qty 100

## 2022-05-14 NOTE — ED Triage Notes (Signed)
Patient here for with complaint of hyperglycemia, states her home glucose monitor has read "high" for the last four or five weeks. Patient is alert, oriented, and in no apparent distress at this time.

## 2022-05-14 NOTE — H&P (Signed)
History and Physical    Barbara Wang MRN:[id number] DOB: 07/08/1966 DOA: 05/14/2022  PCP: [place], Zelda W, NP  Patient coming from: Home  Chief Complaint: High blood glucose  HPI: [name] is a 56 y.o. female with medical history significant of poorly controlled insulin-dependent type 2 diabetes (A1c 13.4 on 05/24/2021), hypertension, asthma, obesity, hyperlipidemia, depression presented to the ED for evaluation of hyperglycemia.  Blood pressure soft and tachycardic on arrival to the ED.  Afebrile.  Labs showing no leukocytosis, sodium 125, chloride 87, glucose 830, bicarb 25, anion gap 13, beta hydroxybutyric acid normal, VBG showing normal pH, BUN 40, creatinine 2.6 (baseline 0.8).  UA negative for ketones, showing large amount of leukocytes and microscopy with >50 WBCs and few bacteria. Glucose continued to be elevated despite patient being given a total of 20 units of NovoLog and 2 L normal saline boluses, subsequently started on insulin drip.  She was also given ceftriaxone.  Patient states her home glucose meter has been reading "high" for the past 6 weeks.  She is taking Basaglar 35 units in the morning and 25 units in the evening.  In addition, taking Humalog 12 units with breakfast, 10 units with lunch, and 12 units with dinner.  She is not forthcoming when asked about her diet.  Initially stated that she drinks diet sodas only but later claimed that her doctor told her diet soda was not good for her and she rather drink regular soda.  Denies fevers, shortness of breath, chest pain, nausea, vomiting, abdominal pain, or diarrhea.  Review of Systems:  Review of Systems  All other systems reviewed and are negative.   Past Medical History:  Diagnosis Date   Asthma    Brain tumor (HCC)    Cellulitis 06/2015   rt hand    Depression    History of hiatal hernia    "it went away on it's own"   Hyperlipemia    Hypertension    Neuropathy     "feet & hands "    Obesity    Pancreatitis    Type II diabetes mellitus (HCC)    insulin dependent     Past Surgical History:  Procedure Laterality Date   ABDOMINAL HYSTERECTOMY     BREAST CYST EXCISION Right 10+ yrs ago   abscess removed   CESAREAN SECTION  1981; 1987   CHOLECYSTECTOMY  05/11/2012   Procedure: LAPAROSCOPIC CHOLECYSTECTOMY WITH INTRAOPERATIVE CHOLANGIOGRAM;  Surgeon: Eric M Wilson, MD,FACS;  Location: [facility] OR;  Service: General;  Laterality: N/A;   SHOULDER ARTHROSCOPY W/ ROTATOR CUFF REPAIR Right    TUBAL LIGATION  10/25/1999   /notes 01/11/2011     reports that she has been smoking cigarettes. She has a 40.00 pack-year smoking history. She has never used smokeless tobacco. She reports that she does not drink alcohol and does not use drugs.  Allergies  Allergen Reactions   Penicillins Hives and Shortness Of Breath    Has patient had a PCN reaction causing immediate rash, facial/tongue/throat swelling, SOB or lightheadedness with hypotension: Yes Has patient had a PCN reaction causing severe rash involving mucus membranes or skin necrosis: No Has patient had a PCN reaction that required hospitalization No Has patient had a PCN reaction occurring within the last 10 years: No If all of the above answers are "NO", then may proceed with Cephalosporin use.   Azithromycin Hives   Erythromycin Base Other (See Comments)    Unknown   Sulfa Antibiotics Hives     Tramadol Hives    Family History  Problem Relation Age of Onset   Stroke Mother    Breast cancer Mother 33   Diabetes Father    Diabetes Sister    Breast cancer Maternal Aunt    Breast cancer Maternal Aunt    Breast cancer Maternal Grandmother     Prior to Admission medications   Medication Sig Start Date End Date Taking? Authorizing Provider  acetaminophen (TYLENOL) 500 MG tablet Take 1,000 mg by mouth every 6 (six) hours as needed for moderate pain.    [provider]  albuterol (VENTOLIN HFA) 108 (90 Base) MCG/ACT  inhaler Inhale 1-2 puffs into the lungs every 6 (six) hours as needed for wheezing or shortness of breath. 05/24/21   Gildardo Pounds, NP  aspirin 81 MG EC tablet Take 1 tablet (81 mg total) by mouth daily. 12/12/19   Gildardo Pounds, NP  atorvastatin (LIPITOR) 40 MG tablet Take 1 tablet (40 mg total) by mouth daily at 6pm. 09/23/21     Blood Glucose Monitoring Suppl (ACCU-CHEK GUIDE) w/Device KIT Use to check blood sugar three times daily. 03/02/22   Charlott Rakes, MD  Blood Glucose Monitoring Suppl (ONETOUCH VERIO) w/Device KIT Use to check blood sugar three times daily. 03/02/22   Charlott Rakes, MD  doxycycline (VIBRAMYCIN) 100 MG capsule Take 1 capsule (100 mg total) by mouth 2 (two) times daily. 01/25/22   Mayers, Cari S, PA-C  fluconazole (DIFLUCAN) 150 MG tablet Take 1 tablet (150 mg total) by mouth daily. 01/25/22   Mayers, Cari S, PA-C  fluticasone (FLONASE) 50 MCG/ACT nasal spray Place 2 sprays into both nostrils daily. 12/12/19   Gildardo Pounds, NP  gabapentin (NEURONTIN) 600 MG tablet Take 1 tablet (600 mg total) by mouth 3 (three) times daily. 05/24/21   Gildardo Pounds, NP  glucose blood (ONETOUCH VERIO) test strip Use to check blood sugar three times daily. 03/02/22   Charlott Rakes, MD  Insulin Glargine (BASAGLAR KWIKPEN) 100 UNIT/ML Inject 60 Units into the skin daily. 03/02/22   Newlin, Charlane Ferretti, MD  insulin lispro (HUMALOG) 100 UNIT/ML KwikPen INJECT 12 UNITS INTO THE SKIN 3 (THREE) TIMES DAILY WITH MEALS. 01/25/22   Mayers, Cari S, PA-C  Insulin Pen Needle (B-D UF III MINI PEN NEEDLES) 31G X 5 MM MISC Inject into the skin weekly. 05/24/21   Gildardo Pounds, NP  lisinopril-hydrochlorothiazide (ZESTORETIC) 20-25 MG tablet Take 1 tablet by mouth daily for 30 days. 03/31/22   Gildardo Pounds, NP  OneTouch Delica Lancets 96G MISC Use to check blood sugar three times daily. 03/02/22   Charlott Rakes, MD  pantoprazole (PROTONIX) 40 MG tablet Take 1 tablet (40 mg total) by mouth daily. Patient not  taking: Reported on 01/25/2022 09/23/21     sertraline (ZOLOFT) 50 MG tablet Take 1 tablet (50 mg total) by mouth daily. 12/06/21   Mayers, Cari S, PA-C  sodium chloride (OCEAN) 0.65 % SOLN nasal spray Place 1 spray into both nostrils every 4 (four) hours as needed for congestion. 01/02/19   Arrien, Jimmy Picket, MD  TRUEplus Lancets 28G MISC 1 each by Does not apply route every 8 (eight) hours as needed. 05/24/21   Gildardo Pounds, NP  Vitamin D, Ergocalciferol, (DRISDOL) 1.25 MG (50000 UNIT) CAPS capsule Take 1 capsule (50,000 Units total) by mouth every 7 (seven) days. 12/07/21   Mayers, Cari S, PA-C  insulin NPH Human (HUMULIN N) 100 UNIT/ML injection Inject 50 Units into the skin  2 times daily with meals for 30 days. (discard after 31 days) 09/23/21 10/06/21      Physical Exam: Vitals:   05/14/22 2030 05/14/22 2040 05/14/22 2045 05/14/22 2238  BP: (!) 120/91   113/74  Pulse: (!) 102  (!) 101 96  Resp: (!) 23   (!) 23  Temp:  98.1 F (36.7 C)    TempSrc:  Oral    SpO2: 100%  100% 100%    Physical Exam Vitals reviewed.  Constitutional:      General: She is not in acute distress. HENT:     Head: Normocephalic and atraumatic.  Eyes:     Extraocular Movements: Extraocular movements intact.  Cardiovascular:     Rate and Rhythm: Normal rate and regular rhythm.     Pulses: Normal pulses.  Pulmonary:     Effort: Pulmonary effort is normal. No respiratory distress.     Breath sounds: Normal breath sounds. No wheezing or rales.  Abdominal:     General: Bowel sounds are normal. There is no distension.     Palpations: Abdomen is soft.     Tenderness: There is no abdominal tenderness.  Musculoskeletal:        General: No swelling or tenderness.     Cervical back: Normal range of motion.  Skin:    General: Skin is warm and dry.  Neurological:     General: No focal deficit present.     Mental Status: She is alert and oriented to person, place, and time.      Labs on Admission: I have  personally reviewed following labs and imaging studies  CBC: Recent Labs  Lab 05/14/22 1926 05/14/22 1932  WBC 8.0  --   HGB 14.4 15.3*  HCT 43.0 45.0  MCV 88.8  --   PLT 363  --    Basic Metabolic Panel: Recent Labs  Lab 05/14/22 1654 05/14/22 1932  NA 125* 125*  K 5.0 5.0  CL 87*  --   CO2 25  --   GLUCOSE 830*  --   BUN 40*  --   CREATININE 2.65*  --   CALCIUM 9.3  --    GFR: CrCl cannot be calculated (Unknown ideal weight.). Liver Function Tests: Recent Labs  Lab 05/14/22 1926  AST 15  ALT 14  ALKPHOS 118  BILITOT 0.4  PROT 7.8  ALBUMIN 3.5   No results for input(s): "LIPASE", "AMYLASE" in the last 168 hours. No results for input(s): "AMMONIA" in the last 168 hours. Coagulation Profile: No results for input(s): "INR", "PROTIME" in the last 168 hours. Cardiac Enzymes: No results for input(s): "CKTOTAL", "CKMB", "CKMBINDEX", "TROPONINI" in the last 168 hours. BNP (last 3 results) No results for input(s): "PROBNP" in the last 8760 hours. HbA1C: No results for input(s): "HGBA1C" in the last 72 hours. CBG: Recent Labs  Lab 05/14/22 1642 05/14/22 1918 05/14/22 2040 05/14/22 2202 05/14/22 2237  GLUCAP >600* >600* >600* >600* 494*   Lipid Profile: No results for input(s): "CHOL", "HDL", "LDLCALC", "TRIG", "CHOLHDL", "LDLDIRECT" in the last 72 hours. Thyroid Function Tests: No results for input(s): "TSH", "T4TOTAL", "FREET4", "T3FREE", "THYROIDAB" in the last 72 hours. Anemia Panel: No results for input(s): "VITAMINB12", "FOLATE", "FERRITIN", "TIBC", "IRON", "RETICCTPCT" in the last 72 hours. Urine analysis:    Component Value Date/Time   COLORURINE YELLOW 05/14/2022 1711   APPEARANCEUR CLOUDY (A) 05/14/2022 1711   APPEARANCEUR Cloudy (A) 05/24/2021 1052   LABSPEC 1.018 05/14/2022 1711   PHURINE 5.0 05/14/2022 1711     GLUCOSEU >=500 (A) 05/14/2022 1711   HGBUR MODERATE (A) 05/14/2022 1711   BILIRUBINUR NEGATIVE 05/14/2022 1711   BILIRUBINUR  Negative 05/24/2021 1052   KETONESUR NEGATIVE 05/14/2022 1711   PROTEINUR 30 (A) 05/14/2022 1711   UROBILINOGEN 0.2 12/23/2018 0921   UROBILINOGEN 0.2 01/15/2016 1524   NITRITE NEGATIVE 05/14/2022 1711   LEUKOCYTESUR LARGE (A) 05/14/2022 1711    Radiological Exams on Admission: No results found.  Assessment and Plan  Hyperglycemia/ ?HHS Poorly controlled insulin-dependent type 2 diabetes Suspect medication/dietary noncompliance.  Patient not forthcoming when asked about her diet.  She told me that she drinks diet sodas only but I was informed by ED nursing staff that she was drinking regular soda in triage which had to be taken away from her.  A1c 13.4 on 05/24/2021.  Presenting with glucose 830 without signs of DKA (bicarb and anion gap normal, UA negative for ketones, beta hydroxybutyric acid normal, VBG showing normal pH). Glucose continued to be elevated despite patient being given a total of 20 units of NovoLog and 2 L normal saline boluses, subsequently started on insulin drip.  -Keep n.p.o. -Continue insulin drip at this time -Continue IV fluid hydration -Check serum osmolarity and A1c -Frequent CBG checks per endotool -Initiate diet and transition to basal-bolus regimen after improvement of hyperglycemia -Consult diabetes coordinator  AKI Likely prerenal from dehydration. BUN 40, creatinine 2.6 (baseline 0.8). -Continue IV fluid hydration -Monitor renal function and urine output -Avoid nephrotoxic agents/hold home lisinopril and hydrochlorothiazide  ?UTI UA with evidence of pyuria.  No fever or leukocytosis.  She is not endorsing any urinary symptoms at this time but told ED physician that she was urinating frequently.  Polyuria could also be explained by significant hyperglycemia/glucosuria. -Allergic to penicillins, however, tolerates ceftriaxone.  She was given ceftriaxone in the ED, continue for now. -Add on urine culture  Pseudohyponatremia Due to significant  hyperglycemia.  Corrected sodium within normal range.  Hypertension: Currently normotensive. Hyperlipidemia Asthma: Stable, no signs of acute exacerbation. Depression GERD Diabetic neuropathy -Pharmacy med rec pending.  Obesity -Encourage lifestyle modification/weight loss  DVT prophylaxis: SQ Heparin Code Status: Discussed CODE STATUS with the patient and she wishes to be DNR. Family Communication: No family available at this time. Level of care: Progressive Care Unit Admission status: It is my clinical opinion that referral for OBSERVATION is reasonable and necessary in this patient based on the above information provided. The aforementioned taken together are felt to place the patient at high risk for further clinical deterioration. However, it is anticipated that the patient may be medically stable for discharge from the hospital within 24 to 48 hours.   Shela Leff MD [facility]  If 7PM-7AM, please contact night-coverage [email or web address]  05/14/2022, 10:47 PM

## 2022-05-14 NOTE — ED Provider Notes (Signed)
[facility] EMERGENCY DEPARTMENT Provider Note   CSN: [id number] Arrival date & time: 05/14/22  1633     History  Chief Complaint  Patient presents with   Hyperglycemia    Barbara Wang is a 56 y.o. female history of diabetes, here presenting with hyperglycemia.  Patient states that her blood sugar has never been well controlled.  She saw primary care doctor about 2 months ago in the midst of the appointment.  She states that for the last month or so, her blood sugar has been consistently reading high.  She has been lightheaded and dizzy.  She has been urinating frequently.  The history is provided by the patient.       Home Medications Prior to Admission medications   Medication Sig Start Date End Date Taking? Authorizing Provider  acetaminophen (TYLENOL) 500 MG tablet Take 1,000 mg by mouth every 6 (six) hours as needed for moderate pain.    [provider]  albuterol (VENTOLIN HFA) 108 (90 Base) MCG/ACT inhaler Inhale 1-2 puffs into the lungs every 6 (six) hours as needed for wheezing or shortness of breath. 05/24/21   Gildardo Pounds, NP  aspirin 81 MG EC tablet Take 1 tablet (81 mg total) by mouth daily. 12/12/19   Gildardo Pounds, NP  atorvastatin (LIPITOR) 40 MG tablet Take 1 tablet (40 mg total) by mouth daily at 6pm. 09/23/21     Blood Glucose Monitoring Suppl (ACCU-CHEK GUIDE) w/Device KIT Use to check blood sugar three times daily. 03/02/22   Charlott Rakes, MD  Blood Glucose Monitoring Suppl (ONETOUCH VERIO) w/Device KIT Use to check blood sugar three times daily. 03/02/22   Charlott Rakes, MD  doxycycline (VIBRAMYCIN) 100 MG capsule Take 1 capsule (100 mg total) by mouth 2 (two) times daily. 01/25/22   Mayers, Cari S, PA-C  fluconazole (DIFLUCAN) 150 MG tablet Take 1 tablet (150 mg total) by mouth daily. 01/25/22   Mayers, Cari S, PA-C  fluticasone (FLONASE) 50 MCG/ACT nasal spray Place 2 sprays into both nostrils daily. 12/12/19   Gildardo Pounds, NP  gabapentin (NEURONTIN) 600 MG tablet Take 1 tablet (600 mg total) by mouth 3 (three) times daily. 05/24/21   Gildardo Pounds, NP  glucose blood (ONETOUCH VERIO) test strip Use to check blood sugar three times daily. 03/02/22   Charlott Rakes, MD  Insulin Glargine (BASAGLAR KWIKPEN) 100 UNIT/ML Inject 60 Units into the skin daily. 03/02/22   Newlin, Charlane Ferretti, MD  insulin lispro (HUMALOG) 100 UNIT/ML KwikPen INJECT 12 UNITS INTO THE SKIN 3 (THREE) TIMES DAILY WITH MEALS. 01/25/22   Mayers, Cari S, PA-C  Insulin Pen Needle (B-D UF III MINI PEN NEEDLES) 31G X 5 MM MISC Inject into the skin weekly. 05/24/21   Gildardo Pounds, NP  lisinopril-hydrochlorothiazide (ZESTORETIC) 20-25 MG tablet Take 1 tablet by mouth daily for 30 days. 03/31/22   Gildardo Pounds, NP  OneTouch Delica Lancets 18A MISC Use to check blood sugar three times daily. 03/02/22   Charlott Rakes, MD  pantoprazole (PROTONIX) 40 MG tablet Take 1 tablet (40 mg total) by mouth daily. Patient not taking: Reported on 01/25/2022 09/23/21     sertraline (ZOLOFT) 50 MG tablet Take 1 tablet (50 mg total) by mouth daily. 12/06/21   Mayers, Cari S, PA-C  sodium chloride (OCEAN) 0.65 % SOLN nasal spray Place 1 spray into both nostrils every 4 (four) hours as needed for congestion. 01/02/19   Arrien, Jimmy Picket, MD  TRUEplus  Lancets 28G MISC 1 each by Does not apply route every 8 (eight) hours as needed. 05/24/21   Gildardo Pounds, NP  Vitamin D, Ergocalciferol, (DRISDOL) 1.25 MG (50000 UNIT) CAPS capsule Take 1 capsule (50,000 Units total) by mouth every 7 (seven) days. 12/07/21   Mayers, Cari S, PA-C  insulin NPH Human (HUMULIN N) 100 UNIT/ML injection Inject 50 Units into the skin 2 times daily with meals for 30 days. (discard after 31 days) 09/23/21 10/06/21        Allergies    Penicillins, Azithromycin, Erythromycin base, Sulfa antibiotics, and Tramadol    Review of Systems   Review of Systems  Genitourinary:  Positive for frequency.  All  other systems reviewed and are negative.   Physical Exam Updated Vital Signs BP (!) 98/59 (BP Location: Right Arm)   Pulse (!) 110   Temp 98.2 F (36.8 C) (Oral)   Resp 18   SpO2 97%  Physical Exam Vitals and nursing note reviewed.  HENT:     Head: Normocephalic.     Nose: Nose normal.     Mouth/Throat:     Mouth: Mucous membranes are dry.  Eyes:     Extraocular Movements: Extraocular movements intact.     Pupils: Pupils are equal, round, and reactive to light.  Cardiovascular:     Rate and Rhythm: Normal rate and regular rhythm.     Pulses: Normal pulses.     Heart sounds: Normal heart sounds.  Pulmonary:     Effort: Pulmonary effort is normal.     Breath sounds: Normal breath sounds.  Abdominal:     General: Abdomen is flat.     Palpations: Abdomen is soft.  Musculoskeletal:        General: Normal range of motion.     Cervical back: Normal range of motion and neck supple.  Skin:    General: Skin is warm.     Capillary Refill: Capillary refill takes less than 2 seconds.  Neurological:     General: No focal deficit present.     Mental Status: She is alert and oriented to person, place, and time.  Psychiatric:        Mood and Affect: Mood normal.        Behavior: Behavior normal.     ED Results / Procedures / Treatments   Labs (all labs ordered are listed, but only abnormal results are displayed) Labs Reviewed  BASIC METABOLIC PANEL - Abnormal; Notable for the following components:      Result Value   Sodium 125 (*)    Chloride 87 (*)    Glucose, Bld 830 (*)    BUN 40 (*)    Creatinine, Ser 2.65 (*)    GFR, Estimated 20 (*)    All other components within normal limits  URINALYSIS, ROUTINE W REFLEX MICROSCOPIC - Abnormal; Notable for the following components:   APPearance CLOUDY (*)    Glucose, UA >=500 (*)    Hgb urine dipstick MODERATE (*)    Protein, ur 30 (*)    Leukocytes,Ua LARGE (*)    WBC, UA >50 (*)    Bacteria, UA FEW (*)    All other  components within normal limits  CBG MONITORING, ED - Abnormal; Notable for the following components:   Glucose-Capillary >600 (*)    All other components within normal limits  CBG MONITORING, ED - Abnormal; Notable for the following components:   Glucose-Capillary >600 (*)    All other components within  normal limits  I-STAT VENOUS BLOOD GAS, ED - Abnormal; Notable for the following components:   pO2, Ven 30 (*)    Bicarbonate 29.7 (*)    Acid-Base Excess 4.0 (*)    Sodium 125 (*)    Calcium, Ion 1.09 (*)    Hemoglobin 15.3 (*)    All other components within normal limits  CBC  HEPATIC FUNCTION PANEL  BETA-HYDROXYBUTYRIC ACID    EKG None  Radiology No results found.  Procedures Procedures    CRITICAL CARE Performed by: Wandra Arthurs   Total critical care time: 30 minutes  Critical care time was exclusive of separately billable procedures and treating other patients.  Critical care was necessary to treat or prevent imminent or life-threatening deterioration.  Critical care was time spent personally by me on the following activities: development of treatment plan with patient and/or surrogate as well as nursing, discussions with consultants, evaluation of patient's response to treatment, examination of patient, obtaining history from patient or surrogate, ordering and performing treatments and interventions, ordering and review of laboratory studies, ordering and review of radiographic studies, pulse oximetry and re-evaluation of patient's condition.   Medications Ordered in ED Medications  cefTRIAXone (ROCEPHIN) 1 g in sodium chloride 0.9 % 100 mL IVPB (1 g Intravenous New Bag/Given 05/14/22 1931)  sodium chloride 0.9 % bolus 2,000 mL (2,000 mLs Intravenous New Bag/Given 05/14/22 1924)  insulin aspart (novoLOG) injection 10 Units (10 Units Subcutaneous Given 05/14/22 1924)    ED Course/ Medical Decision Making/ A&P                           Medical Decision  Making Barbara Wang is a 56 y.o. female here presenting with hyperglycemia.  Patient states that her blood sugar has been elevated for the last several weeks.  Patient has missed multiple appointments.  Concern for uncontrolled diabetes versus DKA.  Plan to get CBC and CMP and beta hydroxy uric acid and VBG.  We will hydrate patient and give insulin and reassess.  10:42 PM I reviewed patient's labs and patient has a glucose of 830.  Patient has normal anion gap.  Patient does not appear confused.  I think she is just very noncompliant.  She also has acute renal failure with creatinine of 2.6.  Gave her 2 L normal saline bolus as well as 20 units of insulin.  Patient finally has a readable blood sugar of 494.  I discussed case with the hospitalist.  Patient will be admitted for acute renal failure, hyperglycemia.  She recommends starting insulin drip for better control of her blood sugar.  She will be admitted to stepdown.  Problems Addressed: AKI (acute kidney injury) ([place]): acute illness or injury Hyperglycemia: acute illness or injury  Amount and/or Complexity of Data Reviewed Labs: ordered. Decision-making details documented in ED Course.  Risk Prescription drug management. Decision regarding hospitalization.    Final Clinical Impression(s) / ED Diagnoses Final diagnoses:  None    Rx / DC Orders ED Discharge Orders     None         Drenda Freeze, MD 05/14/22 2248

## 2022-05-15 ENCOUNTER — Other Ambulatory Visit: Payer: Self-pay

## 2022-05-15 DIAGNOSIS — N3001 Acute cystitis with hematuria: Secondary | ICD-10-CM

## 2022-05-15 DIAGNOSIS — N390 Urinary tract infection, site not specified: Secondary | ICD-10-CM | POA: Diagnosis present

## 2022-05-15 DIAGNOSIS — N179 Acute kidney failure, unspecified: Secondary | ICD-10-CM

## 2022-05-15 DIAGNOSIS — Z880 Allergy status to penicillin: Secondary | ICD-10-CM | POA: Diagnosis not present

## 2022-05-15 DIAGNOSIS — E782 Mixed hyperlipidemia: Secondary | ICD-10-CM | POA: Diagnosis present

## 2022-05-15 DIAGNOSIS — E118 Type 2 diabetes mellitus with unspecified complications: Secondary | ICD-10-CM

## 2022-05-15 DIAGNOSIS — Z713 Dietary counseling and surveillance: Secondary | ICD-10-CM | POA: Diagnosis not present

## 2022-05-15 DIAGNOSIS — R319 Hematuria, unspecified: Secondary | ICD-10-CM | POA: Diagnosis present

## 2022-05-15 DIAGNOSIS — Z803 Family history of malignant neoplasm of breast: Secondary | ICD-10-CM | POA: Diagnosis not present

## 2022-05-15 DIAGNOSIS — Z6829 Body mass index (BMI) 29.0-29.9, adult: Secondary | ICD-10-CM | POA: Diagnosis not present

## 2022-05-15 DIAGNOSIS — R739 Hyperglycemia, unspecified: Secondary | ICD-10-CM | POA: Diagnosis present

## 2022-05-15 DIAGNOSIS — Z66 Do not resuscitate: Secondary | ICD-10-CM | POA: Diagnosis present

## 2022-05-15 DIAGNOSIS — D649 Anemia, unspecified: Secondary | ICD-10-CM | POA: Diagnosis present

## 2022-05-15 DIAGNOSIS — I10 Essential (primary) hypertension: Secondary | ICD-10-CM | POA: Diagnosis present

## 2022-05-15 DIAGNOSIS — E1165 Type 2 diabetes mellitus with hyperglycemia: Secondary | ICD-10-CM | POA: Diagnosis not present

## 2022-05-15 DIAGNOSIS — E1100 Type 2 diabetes mellitus with hyperosmolarity without nonketotic hyperglycemic-hyperosmolar coma (NKHHC): Secondary | ICD-10-CM | POA: Diagnosis present

## 2022-05-15 DIAGNOSIS — Z833 Family history of diabetes mellitus: Secondary | ICD-10-CM | POA: Diagnosis not present

## 2022-05-15 DIAGNOSIS — Z794 Long term (current) use of insulin: Secondary | ICD-10-CM | POA: Diagnosis not present

## 2022-05-15 DIAGNOSIS — Z881 Allergy status to other antibiotic agents status: Secondary | ICD-10-CM | POA: Diagnosis not present

## 2022-05-15 DIAGNOSIS — E860 Dehydration: Secondary | ICD-10-CM | POA: Diagnosis present

## 2022-05-15 DIAGNOSIS — Z91199 Patient's noncompliance with other medical treatment and regimen due to unspecified reason: Secondary | ICD-10-CM | POA: Diagnosis not present

## 2022-05-15 DIAGNOSIS — K219 Gastro-esophageal reflux disease without esophagitis: Secondary | ICD-10-CM | POA: Diagnosis present

## 2022-05-15 DIAGNOSIS — Z823 Family history of stroke: Secondary | ICD-10-CM | POA: Diagnosis not present

## 2022-05-15 DIAGNOSIS — E1142 Type 2 diabetes mellitus with diabetic polyneuropathy: Secondary | ICD-10-CM | POA: Diagnosis present

## 2022-05-15 DIAGNOSIS — J45909 Unspecified asthma, uncomplicated: Secondary | ICD-10-CM | POA: Diagnosis present

## 2022-05-15 DIAGNOSIS — E669 Obesity, unspecified: Secondary | ICD-10-CM | POA: Diagnosis present

## 2022-05-15 DIAGNOSIS — Z90710 Acquired absence of both cervix and uterus: Secondary | ICD-10-CM | POA: Diagnosis not present

## 2022-05-15 DIAGNOSIS — F32A Depression, unspecified: Secondary | ICD-10-CM | POA: Diagnosis present

## 2022-05-15 LAB — BASIC METABOLIC PANEL
Anion gap: 9 (ref 5–15)
BUN: 22 mg/dL — ABNORMAL HIGH (ref 6–20)
CO2: 26 mmol/L (ref 22–32)
Calcium: 8.7 mg/dL — ABNORMAL LOW (ref 8.9–10.3)
Chloride: 104 mmol/L (ref 98–111)
Creatinine, Ser: 1.25 mg/dL — ABNORMAL HIGH (ref 0.44–1.00)
GFR, Estimated: 50 mL/min — ABNORMAL LOW (ref >60)
Glucose, Bld: 317 mg/dL — ABNORMAL HIGH (ref 70–99)
Potassium: 3.9 mmol/L (ref 3.5–5.1)
Sodium: 139 mmol/L (ref 135–145)

## 2022-05-15 LAB — CBG MONITORING, ED
Glucose-Capillary: 149 mg/dL — ABNORMAL HIGH (ref 70–99)
Glucose-Capillary: 161 mg/dL — ABNORMAL HIGH (ref 70–99)
Glucose-Capillary: 178 mg/dL — ABNORMAL HIGH (ref 70–99)
Glucose-Capillary: 179 mg/dL — ABNORMAL HIGH (ref 70–99)
Glucose-Capillary: 181 mg/dL — ABNORMAL HIGH (ref 70–99)
Glucose-Capillary: 191 mg/dL — ABNORMAL HIGH (ref 70–99)
Glucose-Capillary: 210 mg/dL — ABNORMAL HIGH (ref 70–99)
Glucose-Capillary: 281 mg/dL — ABNORMAL HIGH (ref 70–99)
Glucose-Capillary: 292 mg/dL — ABNORMAL HIGH (ref 70–99)
Glucose-Capillary: 399 mg/dL — ABNORMAL HIGH (ref 70–99)
Glucose-Capillary: 493 mg/dL — ABNORMAL HIGH (ref 70–99)

## 2022-05-15 LAB — I-STAT BETA HCG BLOOD, ED (MC, WL, AP ONLY): I-stat hCG, quantitative: <5 m[IU]/mL (ref <5)

## 2022-05-15 LAB — GLUCOSE, CAPILLARY
Glucose-Capillary: 337 mg/dL — ABNORMAL HIGH (ref 70–99)
Glucose-Capillary: 472 mg/dL — ABNORMAL HIGH (ref 70–99)
Glucose-Capillary: 500 mg/dL — ABNORMAL HIGH (ref 70–99)

## 2022-05-15 LAB — OSMOLALITY: Osmolality: 319 mOsm/kg — ABNORMAL HIGH (ref 275–295)

## 2022-05-15 MED ORDER — SENNA 8.6 MG PO TABS
1.0000 | ORAL_TABLET | Freq: Every day | ORAL | Status: DC
  Administered 2022-05-15 – 2022-05-16 (×2): 8.6 mg via ORAL
  Filled 2022-05-15 (×3): qty 1

## 2022-05-15 MED ORDER — INSULIN ASPART 100 UNIT/ML IJ SOLN
15.0000 [IU] | Freq: Once | INTRAMUSCULAR | Status: AC
  Administered 2022-05-15: 15 [IU] via SUBCUTANEOUS

## 2022-05-15 MED ORDER — PANTOPRAZOLE SODIUM 40 MG PO TBEC
40.0000 mg | DELAYED_RELEASE_TABLET | Freq: Every day | ORAL | Status: DC
  Administered 2022-05-16 – 2022-05-17 (×2): 40 mg via ORAL
  Filled 2022-05-15 (×3): qty 1

## 2022-05-15 MED ORDER — ATORVASTATIN CALCIUM 40 MG PO TABS
40.0000 mg | ORAL_TABLET | Freq: Every day | ORAL | Status: DC
  Administered 2022-05-15 – 2022-05-16 (×2): 40 mg via ORAL
  Filled 2022-05-15 (×2): qty 1

## 2022-05-15 MED ORDER — INSULIN ASPART 100 UNIT/ML IJ SOLN
0.0000 [IU] | Freq: Three times a day (TID) | INTRAMUSCULAR | Status: DC
  Administered 2022-05-16: 11 [IU] via SUBCUTANEOUS
  Administered 2022-05-16: 5 [IU] via SUBCUTANEOUS
  Administered 2022-05-16: 11 [IU] via SUBCUTANEOUS
  Administered 2022-05-17: 8 [IU] via SUBCUTANEOUS
  Administered 2022-05-17: 3 [IU] via SUBCUTANEOUS

## 2022-05-15 MED ORDER — CEPHALEXIN 500 MG PO CAPS
500.0000 mg | ORAL_CAPSULE | Freq: Three times a day (TID) | ORAL | Status: DC
  Administered 2022-05-15 – 2022-05-17 (×6): 500 mg via ORAL
  Filled 2022-05-15 (×6): qty 1

## 2022-05-15 MED ORDER — INSULIN GLARGINE-YFGN 100 UNIT/ML ~~LOC~~ SOLN
30.0000 [IU] | Freq: Two times a day (BID) | SUBCUTANEOUS | Status: DC
  Administered 2022-05-15: 30 [IU] via SUBCUTANEOUS
  Filled 2022-05-15 (×3): qty 0.3

## 2022-05-15 MED ORDER — ASPIRIN 81 MG PO TBEC
81.0000 mg | DELAYED_RELEASE_TABLET | Freq: Every day | ORAL | Status: DC
  Administered 2022-05-16 – 2022-05-17 (×2): 81 mg via ORAL
  Filled 2022-05-15 (×3): qty 1

## 2022-05-15 MED ORDER — INSULIN GLARGINE-YFGN 100 UNIT/ML ~~LOC~~ SOLN
30.0000 [IU] | Freq: Every day | SUBCUTANEOUS | Status: DC
  Administered 2022-05-15: 30 [IU] via SUBCUTANEOUS
  Filled 2022-05-15: qty 0.3

## 2022-05-15 MED ORDER — SERTRALINE HCL 25 MG PO TABS
50.0000 mg | ORAL_TABLET | Freq: Every day | ORAL | Status: DC
  Administered 2022-05-16 – 2022-05-17 (×2): 50 mg via ORAL
  Filled 2022-05-15: qty 2
  Filled 2022-05-15: qty 1
  Filled 2022-05-15: qty 2

## 2022-05-15 MED ORDER — POLYETHYLENE GLYCOL 3350 17 G PO PACK
17.0000 g | PACK | Freq: Two times a day (BID) | ORAL | Status: DC
  Administered 2022-05-15 – 2022-05-17 (×5): 17 g via ORAL
  Filled 2022-05-15 (×5): qty 1

## 2022-05-15 MED ORDER — INSULIN ASPART 100 UNIT/ML IJ SOLN
0.0000 [IU] | Freq: Every day | INTRAMUSCULAR | Status: DC
  Administered 2022-05-15: 5 [IU] via SUBCUTANEOUS
  Administered 2022-05-16: 2 [IU] via SUBCUTANEOUS

## 2022-05-15 MED ORDER — ALBUTEROL SULFATE (2.5 MG/3ML) 0.083% IN NEBU: 2.5000 mg | INHALATION_SOLUTION | Freq: Four times a day (QID) | RESPIRATORY_TRACT | Status: DC | PRN

## 2022-05-15 MED ORDER — INSULIN ASPART 100 UNIT/ML IJ SOLN
6.0000 [IU] | Freq: Three times a day (TID) | INTRAMUSCULAR | Status: DC
  Administered 2022-05-15 – 2022-05-16 (×3): 6 [IU] via SUBCUTANEOUS

## 2022-05-15 NOTE — ED Notes (Signed)
Pt requested blood draw from IV. Will attempt blood draw closer to 5.

## 2022-05-15 NOTE — Progress Notes (Signed)
  Progress Note   Patient: Barbara Wang [id number]:[id number] DOB: September 20, 1965 DOA: 05/14/2022     0 DOS: the patient was seen and examined on 05/15/2022   Brief hospital course: 56 year old woman PMH diabetes mellitus type 2 with hyperglycemia, uncontrolled with last hemoglobin A1c 13.4 2022, presented with hyperglycemia (glucometer reading "high" at home for the last 6 weeks).  Blood sugar 830 with no evidence of DKA.  Admitted for severe hyperglycemia, acute kidney injury.  Assessment and Plan: Hyperglycemic hyperosmolar state and known diabetic secondary to noncompliance. Diabetes mellitus type 2 uncontrolled with hyperglycemia with associated diabetic neuropathy. --Patient takes insulin perhaps 3 days/week.  She has difficulty affording her medications. --Blood sugars now under 200, transition off infusion.  Consult diabetes Associate Professor as well as dietitian.  Resume gabapentin. --Will need to establish affordable regimen for the patient on discharge and arrange close outpatient follow-up   AKI --Secondary to volume loss secondary to hyperglycemia.  (baseline 0.8). --Continue IV fluid hydration, repeat BMP in a.m. --Hold lisinopril and hydrochlorothiazide for now   UTI with hematuria --Reports dysuria.  Empiric 3-day regimen of antibiotics.  Changed to oral therapy.  Essential hypertension -- Stable.  Lisinopril and hydrochlorothiazide on hold  Hyperlipidemia --Resume statin  Depression -- Resume sertraline  GERD -- PPI therapy  Subjective:  Feels better Very hungry Polyuria Dysuria  Takes insulin maybe 3 days per week, forgets, has difficulty affording  Physical Exam: Vitals:   05/15/22 0715 05/15/22 0730 05/15/22 0745 05/15/22 0800  BP: 117/60 116/65 122/68 131/77  Pulse: 98 95 95 98  Resp:  19 17   Temp:      TempSrc:      SpO2: 100% 100% 100% 99%   Physical Exam Vitals reviewed.  Constitutional:      General: She is not in acute distress.     Appearance: She is not ill-appearing or toxic-appearing.  Cardiovascular:     Rate and Rhythm: Normal rate and regular rhythm.     Heart sounds: No murmur heard. Pulmonary:     Effort: Pulmonary effort is normal. No respiratory distress.     Breath sounds: No wheezing, rhonchi or rales.  Neurological:     Mental Status: She is alert.  Psychiatric:        Mood and Affect: Mood normal.        Behavior: Behavior normal.     Data Reviewed:  CBG under 200 for the last 3 hours  Family Communication: daughter by telephone by patient request  Disposition: Status is: Observation   Planned Discharge Destination: Home    Time spent: 35 minutes  Author: Murray Hodgkins, MD 05/15/2022 10:20 AM  For on call review [email or web address].

## 2022-05-15 NOTE — Inpatient Diabetes Management (Signed)
Inpatient Diabetes Program Recommendations  AACE/ADA: New Consensus Statement on Inpatient Glycemic Control (2015)  Target Ranges:  Prepandial:   less than 140 mg/dL      Peak postprandial:   less than 180 mg/dL (1-2 hours)      Critically ill patients:  140 - 180 mg/dL   Lab Results  Component Value Date   GLUCAP 292 (H) 05/15/2022   HGBA1C  01/25/2022     Comment:     >15    Review of Glycemic Control  Diabetes history: DM 2 Outpatient Diabetes medications: Basaglar 35 units qam, 25 units qpm, Humalog 12 units tid Current orders for Inpatient glycemic control:  Semglee 30 units Daily Novolog 0-15 units tid + hs Novolog 6 units tid meal coverage  Note: Pt goes to [facility] outpt. Pt last seen Benard Halsted, RPH-CPP for Diabetes Management on 03/02/22. A1c was 15%.   Spoke with pt at bedside. Pt reports not being able to afford insulin and rations it out. Insulin at the [facility] pharmacy is cheaper than [facility] at $10/per insulin. Besides this the only other option for affordability is for pt to file again for financial assistance to potentially get her insulins for free through the [facility]. Pt reports she did not qualify last time. Pt missed her appointment at the clinic for follow up after her visit for DM. Pt reports having challenges in practicing a healthy diet and able to get proper foods. When asked about regular sodas, pt denies drinking regular beverages. I generally coached pt on proper diet. Pt mentions unable to be physically active. Pt does not need other DM supplies other than insulin at this time.  Will follow glucose trends while here.   Thanks,  Tama Headings RN, MSN, BC-ADM Inpatient Diabetes Coordinator Team Pager [phone] (8a-5p)

## 2022-05-15 NOTE — Hospital Course (Addendum)
56 year old woman PMH diabetes mellitus type 2 with hyperglycemia, uncontrolled with last hemoglobin A1c 13.4 2022, presented with hyperglycemia (glucometer reading "high" at home for the last 6 weeks).  Blood sugar 830 with no evidence of DKA.  Admitted for severe hyperglycemia, acute kidney injury.

## 2022-05-16 DIAGNOSIS — E1165 Type 2 diabetes mellitus with hyperglycemia: Secondary | ICD-10-CM | POA: Diagnosis not present

## 2022-05-16 DIAGNOSIS — E118 Type 2 diabetes mellitus with unspecified complications: Secondary | ICD-10-CM | POA: Diagnosis not present

## 2022-05-16 DIAGNOSIS — E1100 Type 2 diabetes mellitus with hyperosmolarity without nonketotic hyperglycemic-hyperosmolar coma (NKHHC): Secondary | ICD-10-CM | POA: Diagnosis not present

## 2022-05-16 DIAGNOSIS — N179 Acute kidney failure, unspecified: Secondary | ICD-10-CM | POA: Diagnosis not present

## 2022-05-16 LAB — BASIC METABOLIC PANEL
Anion gap: 9 (ref 5–15)
BUN: 21 mg/dL — ABNORMAL HIGH (ref 6–20)
CO2: 24 mmol/L (ref 22–32)
Calcium: 8.8 mg/dL — ABNORMAL LOW (ref 8.9–10.3)
Chloride: 101 mmol/L (ref 98–111)
Creatinine, Ser: 1.1 mg/dL — ABNORMAL HIGH (ref 0.44–1.00)
GFR, Estimated: 58 mL/min — ABNORMAL LOW (ref >60)
Glucose, Bld: 280 mg/dL — ABNORMAL HIGH (ref 70–99)
Potassium: 4.1 mmol/L (ref 3.5–5.1)
Sodium: 134 mmol/L — ABNORMAL LOW (ref 135–145)

## 2022-05-16 LAB — GLUCOSE, CAPILLARY
Glucose-Capillary: 325 mg/dL — ABNORMAL HIGH (ref 70–99)
Glucose-Capillary: 323 mg/dL — ABNORMAL HIGH (ref 70–99)
Glucose-Capillary: 230 mg/dL — ABNORMAL HIGH (ref 70–99)
Glucose-Capillary: 210 mg/dL — ABNORMAL HIGH (ref 70–99)

## 2022-05-16 LAB — HEMOGLOBIN A1C
Hgb A1c MFr Bld: >15.5 % — ABNORMAL HIGH (ref 4.8–5.6)
Mean Plasma Glucose: >398 mg/dL

## 2022-05-16 MED ORDER — INSULIN GLARGINE-YFGN 100 UNIT/ML ~~LOC~~ SOLN
40.0000 [IU] | Freq: Two times a day (BID) | SUBCUTANEOUS | Status: DC
  Administered 2022-05-16 – 2022-05-17 (×3): 40 [IU] via SUBCUTANEOUS
  Filled 2022-05-16 (×5): qty 0.4

## 2022-05-16 MED ORDER — INSULIN ASPART 100 UNIT/ML IJ SOLN
8.0000 [IU] | Freq: Three times a day (TID) | INTRAMUSCULAR | Status: DC
  Administered 2022-05-16 – 2022-05-17 (×2): 8 [IU] via SUBCUTANEOUS

## 2022-05-16 MED ORDER — GABAPENTIN 600 MG PO TABS
600.0000 mg | ORAL_TABLET | Freq: Every day | ORAL | Status: DC | PRN
  Administered 2022-05-16: 600 mg via ORAL
  Filled 2022-05-16: qty 1

## 2022-05-16 NOTE — TOC Initial Note (Addendum)
Transition of Care [facility]) - Initial/Assessment Note    Patient Details  Name: Barbara Wang MRN: [id number] Date of Birth: 10-14-1965  Transition of [facility] CM/SW Contact:    Carles Collet, RN Phone Number: 05/16/2022, 4:21 PM  Clinical Narrative:                  Damaris Schooner w patient at bedside. Patient not engaged in conversation, playing on phone during assessment. She requested [place] RN to come to house to help her with medications. Asked her elaborate on what she needed help with and she wanted someone to come to her house everyday to remind her to take her medications.  Discussed that this help is not available from home health services and that she will need to be more responsible for her medication administration and remembering to take her meds. Suggested that she set timers on her phone, leave notes around her house to remind her, and ask family or friends for help. Benefit check sent to [place] for long and short acting insulins  Primary Care [employer] HMO, marketplace  Expected Discharge Plan: Home/Self Care Barriers to Discharge: Continued Medical Work up   Patient Goals and CMS Choice Patient states their goals for this hospitalization and ongoing recovery are:: to go home   Choice offered to / list presented to : NA  Expected Discharge Plan and Services Expected Discharge Plan: Home/Self Care   Discharge Planning Services: CM Consult Post Acute Care Choice: NA Living arrangements for the past 2 months: Single Family Home                 DME Arranged: N/A         HH Arranged: NA          Prior Living Arrangements/Services Living arrangements for the past 2 months: Single Family Home Lives with:: Friends                   Activities of Daily Living Home Assistive Devices/Equipment: Cane (specify quad or straight) (straight) ADL Screening (condition at time of admission) Patient's cognitive ability adequate to safely complete daily  activities?: Yes Is the patient deaf or have difficulty hearing?: No Does the patient have difficulty seeing, even when wearing glasses/contacts?: No Does the patient have difficulty concentrating, remembering, or making decisions?: No Patient able to express need for assistance with ADLs?: Yes Does the patient have difficulty dressing or bathing?: No Independently performs ADLs?: No Communication: Independent Is this a change from baseline?: Pre-admission baseline Dressing (OT): Needs assistance Is this a change from baseline?: Change from baseline, expected to last >3 days Grooming: Needs assistance Is this a change from baseline?: Change from baseline, expected to last >3 days Feeding: Independent Bathing: Needs assistance Is this a change from baseline?: Change from baseline, expected to last >3 days Toileting: Needs assistance Is this a change from baseline?: Change from baseline, expected to last >3days In/Out Bed: Needs assistance Is this a change from baseline?: Change from baseline, expected to last >3 days Walks in Home: Needs assistance Is this a change from baseline?: Change from baseline, expected to last >3 days Does the patient have difficulty walking or climbing stairs?: Yes Weakness of Legs: Both Weakness of Arms/Hands: None  Permission Sought/Granted                  Emotional Assessment              Admission diagnosis:  Hyperglycemia [R73.9]  AKI (acute kidney injury) ([place]) [N17.9] Hyperosmolar hyperglycemic state (HHS) ([place]) [E11.00] Patient Active Problem List   Diagnosis Date Noted   Hyperglycemia 05/14/2022   AKI (acute kidney injury) ([place]) 05/14/2022   Moderate episode of recurrent major depressive disorder ([place]) 01/26/2022   Vitamin D deficiency 01/26/2022   Antibiotic-induced yeast infection 01/26/2022   Wrist pain, acute, right 07/22/2019   Mixed hyperlipidemia 07/22/2019   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   UTI (urinary tract  infection) 12/29/2018   Asthma 03/16/2017   Hyperosmolar hyperglycemic state (HHS) ([place]) 07/02/2016   Obesity    Essential hypertension    Poorly controlled type 2 diabetes mellitus with complication ([place])    PCP:  Gildardo Pounds, NP Pharmacy:   [facility] [employer], [place] [address] Phone: [phone] Fax: [facility] [address] [place] [address] [place] [address] Phone: [phone] Fax: [phone]  [facility] [place], [place] [address] [place] [address] Phone: [phone] Fax: [phone]     Social Determinants of Health (SDOH) Interventions    Readmission Risk Interventions     No data to display

## 2022-05-16 NOTE — Progress Notes (Signed)
Mobility Specialist: Progress Note   05/16/22 1102  Mobility  Activity Ambulated with assistance in hallway  Level of Assistance Contact guard assist, steadying assist  Assistive Device Front wheel walker  Distance Ambulated (ft) 350 ft  Activity Response Tolerated well  $Mobility charge 1 Mobility   Pt received in the bed and agreeable to mobility. C/o soreness on her backside in the bed but progressed better during ambulation, otherwise asymptomatic. Pt back to bed after session with call bell and phone at her side.   [facility] Woodard Perrell Mobility Specialist Mobility Specialist [address]: [phone]

## 2022-05-16 NOTE — Discharge Instructions (Signed)

## 2022-05-16 NOTE — Progress Notes (Signed)
CSW spoke with patient's daughter Charleston Ropes to discuss discharge plan. Charleston Ropes reports the patient's insurance has lapsed and is no longer active. CSW and Charleston Ropes discussed how to apply for Medicaid for services in the home. Charleston Ropes states she will provide the patient with transportation home once ready for discharge.  Madilyn Fireman, MSW, LCSW Transitions of Care  Clinical Social Worker II [phone]

## 2022-05-16 NOTE — Progress Notes (Signed)
Brief Nutrition Note  Consult received, DM education requested. DM educator consulted for education as well. Attached education to AVS for review and referred to outpatient education center.  Will not follow, please consult if acute needs arise.  Ranell Patrick, RD, LDN Clinical Dietitian RD pager # available in [place]  After hours/weekend pager # available in [facility]

## 2022-05-16 NOTE — Progress Notes (Signed)
  Progress Note   Patient: Barbara Wang TML:[id number] DOB: Jan 09, 1966 DOA: 05/14/2022     1 DOS: the patient was seen and examined on 05/16/2022   Brief hospital course: 56 year old woman PMH diabetes mellitus type 2 with hyperglycemia, uncontrolled with last hemoglobin A1c 13.4 2022, presented with hyperglycemia (glucometer reading "high" at home for the last 6 weeks).  Blood sugar 830 with no evidence of DKA.  Admitted for severe hyperglycemia, acute kidney injury.  Acute kidney injury resolved.  Remains hyperglycemic but overall better.  Adjusting insulin.  Can likely go home in the next 24 to 48 hours.  Assessment and Plan: Hyperglycemic hyperosmolar state and known diabetic secondary to noncompliance. Diabetes mellitus type 2 uncontrolled with hyperglycemia with associated diabetic neuropathy. --Patient takes insulin perhaps 3 days/week.  She has difficulty affording her medications. --Remains very hyperglycemic.  Increase long-acting insulin.  Increase meal coverage. --Will need to establish affordable regimen for the patient on discharge and arrange close outpatient follow-up   AKI --Secondary to volume loss secondary to hyperglycemia.  (baseline 0.8). -- Appears nearly resolved at this point.  Expect recovery to baseline with oral intake. --Hold lisinopril and hydrochlorothiazide for now but can resume on discharge.   UTI with hematuria --Reports dysuria.  Empiric 3-day regimen of antibiotics.     Essential hypertension -- Stable.  Lisinopril and hydrochlorothiazide on hold   Hyperlipidemia --Resume statin   Depression -- Resume sertraline   GERD -- PPI therapy  Overall improving.  She would like to go home tomorrow.     Subjective:  Feels ok Eating ok  Physical Exam: Vitals:   05/15/22 1536 05/15/22 2054 05/16/22 0347 05/16/22 0735  BP: (!) 140/77 139/70 117/70 116/63  Pulse: (!) 110 100 (!) 105 (!) 103  Resp: '19 18 15 18  '$ Temp: 99.9 F (37.7 C) 99 F  (37.2 C) 98 F (36.7 C) 98.4 F (36.9 C)  TempSrc: Oral Oral Oral Oral  SpO2: 100% 100% 94% 94%   Physical Exam Vitals reviewed.  Constitutional:      General: She is not in acute distress.    Appearance: She is not ill-appearing or toxic-appearing.  Cardiovascular:     Rate and Rhythm: Normal rate and regular rhythm.     Heart sounds: No murmur heard. Pulmonary:     Effort: Pulmonary effort is normal. No respiratory distress.     Breath sounds: No wheezing, rhonchi or rales.  Neurological:     Mental Status: She is alert.  Psychiatric:        Mood and Affect: Mood normal.        Behavior: Behavior normal.      Data Reviewed: CBG high, fasting was 325 Creatinine down to 1.10.  Family Communication:   Disposition: Status is: Inpatient Remains inpatient appropriate because: hyperglycemia  Planned Discharge Destination: Home    Time spent: 20 minutes  Author: Murray Hodgkins, MD 05/16/2022 4:24 PM  For on call review [email or web address].

## 2022-05-16 NOTE — Inpatient Diabetes Management (Signed)
Inpatient Diabetes Program Recommendations  AACE/ADA: New Consensus Statement on Inpatient Glycemic Control (2015)  Target Ranges:  Prepandial:   less than 140 mg/dL      Peak postprandial:   less than 180 mg/dL (1-2 hours)      Critically ill patients:  140 - 180 mg/dL   Lab Results  Component Value Date   GLUCAP 325 (H) 05/16/2022   HGBA1C  01/25/2022     Comment:     >15    Review of Glycemic Control  Diabetes history:  DM2  Outpatient Diabetes medications:  Basaglar 35 QAM, 25 QPM Humalog 12 units TID  Current orders for Inpatient glycemic control:  Semglee 40 units BID Novolog 0-15 units TID and HS Novolog 6 units TID with meals   Inpatient Diabetes Program Recommendations:    Novolog 10 units TID with meals.  Will continue to follow while inpatient.  Thank you, Reche Dixon, MSN, [place] Diabetes Coordinator Inpatient Diabetes Program [phone] (team pager from 8a-5p)

## 2022-05-17 ENCOUNTER — Other Ambulatory Visit (HOSPITAL_COMMUNITY): Payer: Self-pay

## 2022-05-17 DIAGNOSIS — J45909 Unspecified asthma, uncomplicated: Secondary | ICD-10-CM

## 2022-05-17 DIAGNOSIS — R739 Hyperglycemia, unspecified: Secondary | ICD-10-CM | POA: Diagnosis not present

## 2022-05-17 DIAGNOSIS — N179 Acute kidney failure, unspecified: Secondary | ICD-10-CM | POA: Diagnosis not present

## 2022-05-17 DIAGNOSIS — E782 Mixed hyperlipidemia: Secondary | ICD-10-CM

## 2022-05-17 DIAGNOSIS — E1100 Type 2 diabetes mellitus with hyperosmolarity without nonketotic hyperglycemic-hyperosmolar coma (NKHHC): Secondary | ICD-10-CM | POA: Diagnosis not present

## 2022-05-17 LAB — CBC WITH DIFFERENTIAL/PLATELET
Abs Immature Granulocytes: 0.03 10*3/uL (ref 0.00–0.07)
Basophils Absolute: 0 10*3/uL (ref 0.0–0.1)
Basophils Relative: 1 %
Eosinophils Absolute: 0.1 10*3/uL (ref 0.0–0.5)
Eosinophils Relative: 2 %
HCT: 34.6 % — ABNORMAL LOW (ref 36.0–46.0)
Hemoglobin: 11.8 g/dL — ABNORMAL LOW (ref 12.0–15.0)
Immature Granulocytes: 0 %
Lymphocytes Relative: 21 %
Lymphs Abs: 1.6 10*3/uL (ref 0.7–4.0)
MCH: 29.4 pg (ref 26.0–34.0)
MCHC: 34.1 g/dL (ref 30.0–36.0)
MCV: 86.3 fL (ref 80.0–100.0)
Monocytes Absolute: 0.8 10*3/uL (ref 0.1–1.0)
Monocytes Relative: 10 %
Neutro Abs: 5.1 10*3/uL (ref 1.7–7.7)
Neutrophils Relative %: 66 %
Platelets: 345 10*3/uL (ref 150–400)
RBC: 4.01 MIL/uL (ref 3.87–5.11)
RDW: 11.9 % (ref 11.5–15.5)
WBC: 7.7 10*3/uL (ref 4.0–10.5)
nRBC: 0 % (ref 0.0–0.2)

## 2022-05-17 LAB — COMPREHENSIVE METABOLIC PANEL
ALT: 14 U/L (ref 0–44)
AST: 20 U/L (ref 15–41)
Albumin: 2.5 g/dL — ABNORMAL LOW (ref 3.5–5.0)
Alkaline Phosphatase: 66 U/L (ref 38–126)
Anion gap: 7 (ref 5–15)
BUN: 12 mg/dL (ref 6–20)
CO2: 28 mmol/L (ref 22–32)
Calcium: 9 mg/dL (ref 8.9–10.3)
Chloride: 104 mmol/L (ref 98–111)
Creatinine, Ser: 1.02 mg/dL — ABNORMAL HIGH (ref 0.44–1.00)
GFR, Estimated: >60 mL/min (ref >60)
Glucose, Bld: 199 mg/dL — ABNORMAL HIGH (ref 70–99)
Potassium: 4 mmol/L (ref 3.5–5.1)
Sodium: 139 mmol/L (ref 135–145)
Total Bilirubin: 0.7 mg/dL (ref 0.3–1.2)
Total Protein: 6.4 g/dL — ABNORMAL LOW (ref 6.5–8.1)

## 2022-05-17 LAB — GLUCOSE, CAPILLARY
Glucose-Capillary: 158 mg/dL — ABNORMAL HIGH (ref 70–99)
Glucose-Capillary: 300 mg/dL — ABNORMAL HIGH (ref 70–99)

## 2022-05-17 LAB — PHOSPHORUS: Phosphorus: 2.9 mg/dL (ref 2.5–4.6)

## 2022-05-17 LAB — MAGNESIUM: Magnesium: 1.8 mg/dL (ref 1.7–2.4)

## 2022-05-17 MED ORDER — SENNOSIDES-DOCUSATE SODIUM 8.6-50 MG PO TABS
1.0000 | ORAL_TABLET | Freq: Every day | ORAL | 0 refills | Status: DC
  Filled 2022-05-17: qty 30, 30d supply, fill #0

## 2022-05-17 MED ORDER — INSULIN PEN NEEDLE 32G X 4 MM MISC
1.0000 | Freq: Three times a day (TID) | 0 refills | Status: DC
  Filled 2022-05-17: qty 100, 25d supply, fill #0

## 2022-05-17 MED ORDER — INSULIN LISPRO (1 UNIT DIAL) 100 UNIT/ML (KWIKPEN)
12.0000 [IU] | PEN_INJECTOR | Freq: Three times a day (TID) | SUBCUTANEOUS | 0 refills | Status: DC
  Filled 2022-05-17: qty 9, 25d supply, fill #0
  Filled 2022-06-26: qty 3, 9d supply, fill #1
  Filled 2022-06-26 – 2022-07-28 (×2): qty 6, 17d supply, fill #1

## 2022-05-17 MED ORDER — BISACODYL 10 MG RE SUPP: 10.0000 mg | Freq: Every day | RECTAL | Status: DC | PRN

## 2022-05-17 MED ORDER — POLYETHYLENE GLYCOL 3350 17 GM/SCOOP PO POWD
17.0000 g | Freq: Two times a day (BID) | ORAL | 0 refills | Status: DC
  Filled 2022-05-17: qty 238, 7d supply, fill #0

## 2022-05-17 MED ORDER — SENNOSIDES-DOCUSATE SODIUM 8.6-50 MG PO TABS
1.0000 | ORAL_TABLET | Freq: Two times a day (BID) | ORAL | Status: DC
  Administered 2022-05-17: 1 via ORAL
  Filled 2022-05-17: qty 1

## 2022-05-17 MED ORDER — POLYETHYLENE GLYCOL 3350 17 G PO PACK
17.0000 g | PACK | Freq: Two times a day (BID) | ORAL | Status: DC
  Administered 2022-05-17: 17 g via ORAL
  Filled 2022-05-17: qty 1

## 2022-05-17 MED ORDER — GABAPENTIN 600 MG PO TABS: 600.0000 mg | ORAL_TABLET | Freq: Every day | ORAL | Status: DC | PRN

## 2022-05-17 MED ORDER — BASAGLAR KWIKPEN 100 UNIT/ML ~~LOC~~ SOPN
40.0000 [IU] | PEN_INJECTOR | Freq: Two times a day (BID) | SUBCUTANEOUS | 2 refills | Status: DC
  Filled 2022-05-17: qty 15, 19d supply, fill #0
  Filled 2022-06-26: qty 15, 19d supply, fill #1
  Filled 2022-07-28: qty 15, 19d supply, fill #2

## 2022-05-17 MED ORDER — INSULIN ASPART 100 UNIT/ML IJ SOLN
10.0000 [IU] | Freq: Three times a day (TID) | INTRAMUSCULAR | Status: DC
  Administered 2022-05-17: 10 [IU] via SUBCUTANEOUS

## 2022-05-17 NOTE — Evaluation (Signed)
Physical Therapy Evaluation Patient Details Name: Barbara Wang MRN: [id number] DOB: 06/25/1966 Today's Date: 05/17/2022  History of Present Illness  Pt is a 56 y/o female admitted secondary to hyperglycemia and AKI. PMH includes DM and HTN.  Clinical Impression  Pt admitted secondary to problem above with deficits below. Pt with weakness and mild unsteadiness. One LOB and required min A for steadying. Otherwise requiring min guard A for RW. Pt reports feeling more comfortable using RW, and would benefit from use at home for increased safety. Recommending HHPT to address deficits. Will continue to follow acutely.        Recommendations for follow up therapy are one component of a multi-disciplinary discharge planning process, led by the attending physician.  Recommendations may be updated based on patient status, additional functional criteria and insurance authorization.  Follow Up Recommendations Home health PT      Assistance Recommended at Discharge Frequent or constant Supervision/Assistance  Patient can return home with the following  Assistance with cooking/housework;Assist for transportation;Help with stairs or ramp for entrance    Equipment Recommendations Rolling walker (2 wheels)  Recommendations for Other Services       Functional Status Assessment Patient has had a recent decline in their functional status and demonstrates the ability to make significant improvements in function in a reasonable and predictable amount of time.     Precautions / Restrictions Precautions Precautions: Fall Restrictions Weight Bearing Restrictions: No      Mobility  Bed Mobility Overal bed mobility: Needs Assistance Bed Mobility: Supine to Sit     Supine to sit: Supervision     General bed mobility comments: supervision for safety. Increased time required    Transfers Overall transfer level: Needs assistance Equipment used: Rolling walker (2 wheels) Transfers: Sit  to/from Stand Sit to Stand: Min guard           General transfer comment: Min guard for safety.    Ambulation/Gait Ambulation/Gait assistance: Min guard, Min assist Gait Distance (Feet): 120 Feet Assistive device: Rolling walker (2 wheels) Gait Pattern/deviations: Step-to pattern, Decreased stride length Gait velocity: Decreased     General Gait Details: Slow, guarded gait. One LOB initially, requiring min A for steadying. Balance improved as ambulation progressed and pt required min guard for safety. Pt reports she feels more comfortable using RW  Stairs            Wheelchair Mobility    Modified Rankin (Stroke Patients Only)       Balance Overall balance assessment: Needs assistance Sitting-balance support: No upper extremity supported, Feet supported Sitting balance-Leahy Scale: Fair     Standing balance support: Bilateral upper extremity supported Standing balance-Leahy Scale: Poor Standing balance comment: Reliant on UE support                             Pertinent Vitals/Pain Pain Assessment Pain Assessment: Faces Faces Pain Scale: Hurts even more Pain Location: bottom Pain Descriptors / Indicators: Grimacing, Guarding Pain Intervention(s): Limited activity within patient's tolerance, Monitored during session, Repositioned    Home Living Family/patient expects to be discharged to:: Private residence Living Arrangements: Non-relatives/Friends Available Help at Discharge: Family Type of Home: House Home Access: Stairs to enter Entrance Stairs-Rails: Right (in back, no rail in front) Entrance Stairs-Number of Steps: 5 in back, 4 in front   Home Layout: One level Home Equipment: Cane - single point;Crutches;BSC/3in1;Shower seat      Prior Function Prior Level  of Function : Independent/Modified Independent             Mobility Comments: Uses cane vs crutches ADLs Comments: reports she has issues with med management     Hand  Dominance   Dominant Hand: Right    Extremity/Trunk Assessment   Upper Extremity Assessment Upper Extremity Assessment: Defer to OT evaluation    Lower Extremity Assessment Lower Extremity Assessment: Generalized weakness    Cervical / Trunk Assessment Cervical / Trunk Assessment: Normal  Communication   Communication: No difficulties  Cognition Arousal/Alertness: Awake/alert Behavior During Therapy: WFL for tasks assessed/performed Overall Cognitive Status: Within Functional Limits for tasks assessed                                          General Comments      Exercises     Assessment/Plan    PT Assessment Patient needs continued PT services  PT Problem List Decreased strength;Decreased balance;Decreased activity tolerance;Decreased mobility;Decreased knowledge of use of DME;Decreased knowledge of precautions       PT Treatment Interventions DME instruction;Gait training;Functional mobility training;Stair training;Therapeutic activities;Therapeutic exercise;Balance training;Patient/family education    PT Goals (Current goals can be found in the Care Plan section)  Acute Rehab PT Goals Patient Stated Goal: to go home PT Goal Formulation: With patient Time For Goal Achievement: 05/31/22 Potential to Achieve Goals: Good    Frequency Min 3X/week     Co-evaluation               AM-PAC PT "6 Clicks" Mobility  Outcome Measure Help needed turning from your back to your side while in a flat bed without using bedrails?: None Help needed moving from lying on your back to sitting on the side of a flat bed without using bedrails?: A Little Help needed moving to and from a bed to a chair (including a wheelchair)?: A Little Help needed standing up from a chair using your arms (e.g., wheelchair or bedside chair)?: A Little Help needed to walk in hospital room?: A Little Help needed climbing 3-5 steps with a railing? : A Lot 6 Click Score: 18     End of Session Equipment Utilized During Treatment: Gait belt Activity Tolerance: Patient tolerated treatment well Patient left: Other (comment) (standing in room with OT present) Nurse Communication: Mobility status PT Visit Diagnosis: Unsteadiness on feet (R26.81);Muscle weakness (generalized) (M62.81)    Time: 5643-3295 PT Time Calculation (min) (ACUTE ONLY): 12 min   Charges:   PT Evaluation $PT Eval Low Complexity: 1 Low          Reuel Derby, PT, DPT  Acute Rehabilitation Services  Office: [phone]   Rudean Hitt 05/17/2022, 11:31 AM

## 2022-05-17 NOTE — Plan of Care (Signed)

## 2022-05-17 NOTE — TOC Benefit Eligibility Note (Signed)
Transition of Care [facility]) Benefit Eligibility Note    Patient Details  Name: Barbara Wang MRN: [id number] Date of Birth: [birth year]   Medication/Dose: LANTUS 40 UNITS -NOT COVER ,P/A-YES ,  SEMGLEE 40 UNITS -NOT COVER   P/A-YES , LEVEMIR 40 UNITS-COVER P/A-YES  Covered?:  (YES and  NO)     Prescription Coverage Preferred Pharmacy: [facility] with Person/Company/Phone Number:: ASHLEY  @ [employer]  JS # [phone]  Co-Pay: WILL NEED PRIOR APPROVAL  Prior Approval: Yes ([phone])  Deductible: Unmet (OUT-OF-POCKET:UNMET)  Additional Notes: NOVOLOG 70/30 [place] P/A-YES , HUMALOG [place] P/A-YES    Memory Argue Phone Number: 05/17/2022, 10:28 AM

## 2022-05-17 NOTE — Evaluation (Signed)
Occupational Therapy Evaluation/Discharge Patient Details Name: Barbara Wang MRN: [id number] DOB: Jul 08, 1966 Today's Date: 05/17/2022   History of Present Illness Pt is a 56 y/o female admitted secondary to hyperglycemia and AKI. PMH includes DM and HTN.   Clinical Impression   PTA, pt lives with significant other, typically Modified Independent with ADLs, IADLs, mobility (occasional cane use) and driving. Pt works at [employer]. Pt presents now with minor deficits in dynamic standing balance and overall endurance. Pt able to mobilize using RW with Supervision with minor cues for DME use. Pt able to complete UB/LB ADLs with Modified Independence to Supervision. Pt reports use of pill box for medications w/o issues at home though difficulties remembering to take insulin as this does not fit in the pill box. Educated on strategies to improve recall of insulin and encouraged pt to have family members involved to ensure compliance. Anticipate pt to return to PLOF quickly without need for OT services. OT to sign off at acute level.       Recommendations for follow up therapy are one component of a multi-disciplinary discharge planning process, led by the attending physician.  Recommendations may be updated based on patient status, additional functional criteria and insurance authorization.   Follow Up Recommendations  No OT follow up    Assistance Recommended at Discharge PRN  Patient can return home with the following Direct supervision/assist for medications management    Functional Status Assessment  Patient has had a recent decline in their functional status and demonstrates the ability to make significant improvements in function in a reasonable and predictable amount of time.  Equipment Recommendations  Other (comment) (Rolling walker)    Recommendations for Other Services       Precautions / Restrictions Precautions Precautions: Fall Restrictions Weight Bearing Restrictions: No       Mobility Bed Mobility Overal bed mobility: Modified Independent                  Transfers Overall transfer level: Modified independent Equipment used: Rolling walker (2 wheels)                      Balance Overall balance assessment: Needs assistance Sitting-balance support: No upper extremity supported, Feet supported Sitting balance-Leahy Scale: Normal     Standing balance support: No upper extremity supported, During functional activity, Bilateral upper extremity supported Standing balance-Leahy Scale: Good Standing balance comment: improved stability with RW though able to reach outside of BOS, walk short distances without AD`                           ADL either performed or assessed with clinical judgement   ADL Overall ADL's : Needs assistance/impaired Eating/Feeding: Independent   Grooming: Modified independent;Standing;Oral care;Wash/dry hands   Upper Body Bathing: Modified independent   Lower Body Bathing: Supervison/ safety;Sitting/lateral leans;Sit to/from stand   Upper Body Dressing : Modified independent;Sitting   Lower Body Dressing: Sitting/lateral leans;Sit to/from stand;Modified independent Lower Body Dressing Details (indicate cue type and reason): Able to don mesh underwear Toilet Transfer: Supervision/safety;Ambulation;Regular Toilet;Rolling walker (2 wheels)   Toileting- Clothing Manipulation and Hygiene: Modified independent;Sitting/lateral lean;Sit to/from stand Toileting - Clothing Manipulation Details (indicate cue type and reason): no assist needed, no LOB     Functional mobility during ADLs: Supervision/safety;Rolling walker (2 wheels) General ADL Comments: Minor balance deficits with new RW use though close to baseline. Discussed strategies for med mgmt, RN vs family assist  for initial insulin use. Phone reminders or family calls to check in for insulin reminders     Vision Ability to See in Adequate Light:  0 Adequate Patient Visual Report: No change from baseline Vision Assessment?: No apparent visual deficits     Perception     Praxis      Pertinent Vitals/Pain Pain Assessment Pain Assessment: Faces Faces Pain Scale: Hurts little more Pain Location: boil on bottom Pain Descriptors / Indicators: Grimacing, Guarding, Sore Pain Intervention(s): Other (comment) (notified RN)     Hand Dominance Right   Extremity/Trunk Assessment Upper Extremity Assessment Upper Extremity Assessment: Defer to OT evaluation   Lower Extremity Assessment Lower Extremity Assessment: Generalized weakness   Cervical / Trunk Assessment Cervical / Trunk Assessment: Normal   Communication Communication Communication: No difficulties   Cognition Arousal/Alertness: Awake/alert Behavior During Therapy: WFL for tasks assessed/performed Overall Cognitive Status: Within Functional Limits for tasks assessed                                 General Comments: reports difficulty remembering to take insulin though not meds. likely functional and at baseline     General Comments       Exercises     Shoulder Instructions      Home Living Family/patient expects to be discharged to:: Private residence Living Arrangements: Non-relatives/Friends Available Help at Discharge: Family Type of Home: House Home Access: Stairs to enter [employer] of Steps: 5 in back, 4 in front Entrance Stairs-Rails: Right (in back, no rail in front) Home Layout: One level     Bathroom Shower/Tub: Teacher, early years/pre: Standard     Home Equipment: Cane - single point;Crutches;BSC/3in1;Shower seat          Prior Functioning/Environment Prior Level of Function : Independent/Modified Independent             Mobility Comments: Uses cane vs crutches ADLs Comments: reports she has issues with med management        OT Problem List: Impaired balance (sitting and/or  standing);Decreased activity tolerance      OT Treatment/Interventions:      OT Goals(Current goals can be found in the care plan section) Acute Rehab OT Goals Patient Stated Goal: go home today OT Goal Formulation: All assessment and education complete, DC therapy Time For Goal Achievement: 06/28/22  OT Frequency:      Co-evaluation              AM-PAC OT "6 Clicks" Daily Activity     Outcome Measure Help from another person eating meals?: None Help from another person taking care of personal grooming?: None Help from another person toileting, which includes using toliet, bedpan, or urinal?: A Little Help from another person bathing (including washing, rinsing, drying)?: A Little Help from another person to put on and taking off regular upper body clothing?: None Help from another person to put on and taking off regular lower body clothing?: None 6 Click Score: 22   End of Session Equipment Utilized During Treatment: Rolling walker (2 wheels) Nurse Communication: Mobility status  Activity Tolerance: Patient tolerated treatment well Patient left: in bed;with call bell/phone within reach  OT Visit Diagnosis: Muscle weakness (generalized) (M62.81)                Time: 9242-6834 OT Time Calculation (min): 16 min Charges:  OT General Charges $OT Visit: 1 Visit OT Evaluation $OT  Eval Low Complexity: 1 Low  Malachy Chamber, OTR/L Acute Rehab Services Office: [phone]   Layla Maw 05/17/2022, 11:32 AM

## 2022-05-17 NOTE — TOC Transition Note (Addendum)
Transition of Care [facility]) - CM/SW Discharge Note   Patient Details  Name: RICKEY FARRIER MRN: [id number] Date of Birth: [birth year]  Transition of Care [facility]) CM/SW Contact:  Carles Collet, RN Phone Number: 05/17/2022, 1:39 PM   Clinical Narrative:     RW to be delivered to the room prior to DC Referral pending to [facility] for [facility] PT- declined [facility]- declined [facility]- declined [facility]- declined [facility]- pending Spoke w patient at bedside. She is aware that if [facility] agency is found, then they will call her to set up a time to come see her. She is aware that with her insurance I may not be able to find a [facility] agency.     Barriers to Discharge: No Barriers Identified   Patient Goals and CMS Choice Patient states their goals for this hospitalization and ongoing recovery are:: to go home   Choice offered to / list presented to : NA  Discharge Placement                       Discharge Plan and Services   Discharge Planning Services: CM Consult Post Acute Care Choice: NA          DME Arranged: Walker rolling DME Agency: [facility] Date DME Agency Contacted: 05/17/22 Time DME Agency Contacted: [id number] Representative spoke with at DME Agency: [place]: PT [place]: [place] ([place]) Date [place]: 05/17/22 Time [place]: 1339 Representative spoke with at [place]: [place] ([place]) Interventions     Readmission Risk Interventions     No data to display

## 2022-05-17 NOTE — Discharge Summary (Signed)
Physician Discharge Summary   Patient: Barbara Wang MRN: [id number] DOB: January 27, 1966  Admit date:     05/14/2022  Discharge date: 05/17/22  Discharge Physician: Raiford Noble, DO   PCP: Gildardo Pounds, NP   Recommendations at discharge:   Follow up with PCP within 1-2 weeks and repeat CBC, CMP, Mag, Phos within 1 week Follow up with Endocrinology in the outpatient setting   Discharge Diagnoses: Principal Problem:   Hyperosmolar hyperglycemic state (HHS) ([place]) Active Problems:   Poorly controlled type 2 diabetes mellitus with complication ([place])   Essential hypertension   Obesity   Asthma   UTI (urinary tract infection)   Mixed hyperlipidemia   Hyperglycemia   AKI (acute kidney injury) ([place])  Resolved Problems:   * No resolved hospital problems. *  Hospital Course: 56 year old woman PMH diabetes mellitus type 2 with hyperglycemia, uncontrolled with last hemoglobin A1c 13.4 2022, presented with hyperglycemia (glucometer reading "high" at home for the last 6 weeks).  Blood sugar 830 with no evidence of DKA.  Admitted for severe hyperglycemia, acute kidney injury.  Acute kidney injury resolved.  Remains hyperglycemic but overall better.  Adjusting insulin.  Can likely go home in the next 24 to 48 hours.  Assessment and Plan:  Hyperglycemic hyperosmolar state and known diabetic secondary to noncompliance. Diabetes mellitus type 2 uncontrolled with hyperglycemia with associated diabetic neuropathy. --Patient takes insulin perhaps 3 days/week.  She has difficulty affording her medications. --Remains very hyperglycemic.  Increase long-acting insulin.  Increase meal coverage. --Will need to establish affordable regimen for the patient on discharge and arrange close outpatient follow-up   AKI --Secondary to volume loss secondary to hyperglycemia.  (baseline 0.8). -- Appears nearly resolved at this point.  Expect recovery to baseline with oral intake. --Hold lisinopril and  hydrochlorothiazide for now but can resume on discharge.   UTI with hematuria --Reports dysuria.  Empiric 3-day regimen of antibiotics.     Essential hypertension -- Stable.  Lisinopril and hydrochlorothiazide on hold   Hyperlipidemia --Resume statin   Depression -- Resume sertraline   GERD -- PPI therapy  Overweight -Complicates overall prognosis and care -Estimated body mass index is 29.7 kg/m as calculated from the following:   Height as of 01/25/22: $RemoveBef'5\' 4"'ZSlGKOnDvg$  (1.626 m).   Weight as of 01/25/22: 78.5 kg.  -Weight Loss and Dietary Counseling given  Consultants: Diabetes Education Coordinator  Procedures performed: None  Disposition: Home health Diet recommendation:  Discharge Diet Orders (From admission, onward)     Start     Ordered   05/17/22 0000  Diet - low sodium heart healthy        05/17/22 1232   05/17/22 0000  Diet Carb Modified        05/17/22 1232           Cardiac and Carb modified diet DISCHARGE MEDICATION: Allergies as of 05/17/2022       Reactions   Penicillins Hives, Shortness Of Breath   Has patient had a PCN reaction causing immediate rash, facial/tongue/throat swelling, SOB or lightheadedness with hypotension: Yes Has patient had a PCN reaction causing severe rash involving mucus membranes or skin necrosis: No Has patient had a PCN reaction that required hospitalization No Has patient had a PCN reaction occurring within the last 10 years: No If all of the above answers are "NO", then may proceed with Cephalosporin use.   Azithromycin Hives   Erythromycin Base Hives   Sulfa Antibiotics Hives   Tramadol Hives  Medication List     STOP taking these medications    Vitamin D (Ergocalciferol) 1.25 MG (50000 UNIT) Caps capsule Commonly known as: DRISDOL       TAKE these medications    Accu-Chek Guide w/Device Kit Use to check blood sugar three times daily.   OneTouch Verio Reflect w/Device Kit Use to check blood sugar three  times daily.   acetaminophen 500 MG tablet Commonly known as: TYLENOL Take 1,000 mg by mouth every 6 (six) hours as needed for moderate pain.   albuterol 108 (90 Base) MCG/ACT inhaler Commonly known as: VENTOLIN HFA Inhale 1-2 puffs into the lungs every 6 (six) hours as needed for wheezing or shortness of breath.   aspirin EC 81 MG tablet Take 1 tablet (81 mg total) by mouth daily.   atorvastatin 40 MG tablet Commonly known as: LIPITOR Take 1 tablet (40 mg total) by mouth daily at 6pm.   B-D UF III MINI PEN NEEDLES 31G X 5 MM Misc Generic drug: Insulin Pen Needle Inject into the skin weekly. What changed: Another medication with the same name was added. Make sure you understand how and when to take each.   Pentips 32G X 4 MM Misc Generic drug: Insulin Pen Needle Use 4 (four) times daily with insulin pen -  before meals and at bedtime. What changed: You were already taking a medication with the same name, and this prescription was added. Make sure you understand how and when to take each.   Basaglar KwikPen 100 UNIT/ML Inject 40 Units into the skin 2 (two) times daily. What changed:  how much to take when to take this   fluticasone 50 MCG/ACT nasal spray Commonly known as: FLONASE Place 2 sprays into both nostrils daily. What changed:  when to take this reasons to take this   gabapentin 600 MG tablet Commonly known as: Neurontin Take 1 tablet (600 mg total) by mouth daily as needed (for neuropathy in hands and feet).   HumaLOG KwikPen 100 UNIT/ML KwikPen Generic drug: insulin lispro Inject 12 Units into the skin with breakfast, with lunch, and with evening meal.   lisinopril-hydrochlorothiazide 20-25 MG tablet Commonly known as: ZESTORETIC Take 1 tablet by mouth daily for 30 days.   OneTouch Verio test strip Generic drug: glucose blood Use to check blood sugar three times daily.   pantoprazole 40 MG tablet Commonly known as: PROTONIX Take 1 tablet (40 mg  total) by mouth daily.   polyethylene glycol powder 17 GM/SCOOP powder Commonly known as: GLYCOLAX/MIRALAX Take 17 g by mouth 2 (two) times daily.   Senexon-S 8.6-50 MG tablet Generic drug: senna-docusate Take 1 tablet by mouth at bedtime.   sertraline 50 MG tablet Commonly known as: ZOLOFT Take 1 tablet (50 mg total) by mouth daily.   sodium chloride 0.65 % Soln nasal spray Commonly known as: OCEAN Place 1 spray into both nostrils every 4 (four) hours as needed for congestion.   TRUEplus Lancets 28G Misc 1 each by Does not apply route every 8 (eight) hours as needed.   OneTouch Delica Plus [id number] Misc Use to check blood sugar three times daily.               Durable Medical Equipment  (From admission, onward)           Start     Ordered   05/17/22 1233  For home use only DME Walker rolling  Once       Question Answer Comment  Walker:  With [place]   Patient needs a walker to treat with the following condition Generalized weakness      05/17/22 1233   05/17/22 1139  For home use only DME Walker rolling  Once       Question Answer Comment  Walker: With [place]   Patient needs a walker to treat with the following condition Weakness   Patient needs a walker to treat with the following condition Balance problem      05/17/22 1138           Discharge Exam:  Vitals:   05/16/22 2017 05/17/22 0713  BP: (!) 143/81 (!) 150/81  Pulse: 100 83  Resp: 17 18  Temp: 100.3 F (37.9 C) 98.6 F (37 C)  SpO2: 100% 100%   Examination: Physical Exam:  Constitutional: WN/WD, NAD and appears calm and comfortable Eyes: PERRL, lids and conjunctivae normal, sclerae anicteric  ENMT: External Ears, Nose appear normal. Grossly normal hearing. Mucous membranes are moist. Posterior pharynx clear of any exudate or lesions. Normal dentition.  Neck: Appears normal, supple, no cervical masses, normal ROM, no appreciable thyromegaly Respiratory: Clear to  auscultation bilaterally, no wheezing, rales, rhonchi or crackles. Normal respiratory effort and patient is not tachypenic. No accessory muscle use.  Cardiovascular: RRR, no murmurs / rubs / gallops. S1 and S2 auscultated. No extremity edema. 2+ pedal pulses. No carotid bruits.  Abdomen: Soft, non-tender, non-distended. No masses palpated. No appreciable hepatosplenomegaly. Bowel sounds positive.  GU: Deferred. Musculoskeletal: No clubbing / cyanosis of digits/nails. No joint deformity upper and lower extremities. Good ROM, no contractures. Normal strength and muscle tone.  Skin: No rashes, lesions, ulcers. No induration; Warm and dry.  Neurologic: CN 2-12 grossly intact with no focal deficits. Sensation intact in all 4 Extremities, DTR normal. Strength 5/5 in all 4. Romberg sign cerebellar reflexes not assessed.  Psychiatric: Normal judgment and insight. Alert and oriented x 3. Normal mood and appropriate affect.   Condition at discharge: stable  The results of significant diagnostics from this hospitalization (including imaging, microbiology, ancillary and laboratory) are listed below for reference.   Imaging Studies: No results found.  Microbiology: Results for orders placed or performed in visit on 05/24/21  Microscopic Examination     Status: Abnormal   Collection Time: 05/24/21 10:52 AM  Result Value Ref Range Status   WBC, UA >30 (A) 0 - 5 /hpf Final    Comment: Clumps of leukocytes present.   RBC, Urine 3-10 (A) 0 - 2 /hpf Final   Epithelial Cells (non renal) 0-10 0 - 10 /hpf Final   Casts None seen None seen /lpf Final   Crystals Present (A) N/A Final   Crystal Type Calcium Oxalate N/A Final   Bacteria, UA None seen None seen/Few Final   Yeast, UA Present (A) None seen Final   Labs: CBC: Recent Labs  Lab 05/14/22 1926 05/14/22 1932 05/17/22 0903  WBC 8.0  --  7.7  NEUTROABS  --   --  5.1  HGB 14.4 15.3* 11.8*  HCT 43.0 45.0 34.6*  MCV 88.8  --  86.3  PLT 363  --   956   Basic Metabolic Panel: Recent Labs  Lab 05/14/22 1654 05/14/22 1932 05/15/22 1226 05/16/22 0326 05/17/22 0903  NA 125* 125* 139 134* 139  K 5.0 5.0 3.9 4.1 4.0  CL 87*  --  104 101 104  CO2 25  --  $R'26 24 28  'LT$ GLUCOSE 830*  --  317* 280* 199*  BUN 40*  --  22* 21* 12  CREATININE 2.65*  --  1.25* 1.10* 1.02*  CALCIUM 9.3  --  8.7* 8.8* 9.0  MG  --   --   --   --  1.8  PHOS  --   --   --   --  2.9   Liver Function Tests: Recent Labs  Lab 05/14/22 1926 05/17/22 0903  AST 15 20  ALT 14 14  ALKPHOS 118 66  BILITOT 0.4 0.7  PROT 7.8 6.4*  ALBUMIN 3.5 2.5*   CBG: Recent Labs  Lab 05/16/22 1213 05/16/22 1639 05/16/22 2052 05/17/22 0710 05/17/22 1158  GLUCAP 323* 230* 210* 158* 300*    Discharge time spent: greater than 30 minutes.  Signed: Raiford Noble, DO [facility] 05/17/2022

## 2022-05-17 NOTE — Inpatient Diabetes Management (Addendum)
Inpatient Diabetes Program Recommendations  AACE/ADA: New Consensus Statement on Inpatient Glycemic Control (2015)  Target Ranges:  Prepandial:   less than 140 mg/dL      Peak postprandial:   less than 180 mg/dL (1-2 hours)      Critically ill patients:  140 - 180 mg/dL   Lab Results  Component Value Date   GLUCAP 158 (H) 05/17/2022   HGBA1C >15.5 (H) 05/15/2022    Review of Glycemic Control  Latest Reference Range & Units 05/16/22 07:33 05/16/22 12:13 05/16/22 16:39 05/16/22 20:52 05/17/22 07:10  Glucose-Capillary 70 - 99 mg/dL 325 (H) 323 (H) 230 (H) 210 (H) 158 (H)  (H): Data is abnormally high  Diabetes history:  DM2  Outpatient Diabetes medications:  Basaglar 35 units QAM and 25 units QHS Humalog 12 units TID  Current orders for Inpatient glycemic control:  Semglee 40 units BID Novolog 0-15 units tID and 0-5 units QHS  Inpatient Diabetes Program Recommendations:    Novolog 10 units TID  Referral received today for HHS/affordable insulins.  Please refer to DM coordinators note on 9/18.  She should follow up with [facility] for affordable insulins.  Insulins are $10/insulin at [facility].    Addendum'@10'$ :39:  Please consider for DC:  Basaglar 40 units BID  (order number [id number]) Humalog 12 units TID (order number [id number]) Insulin pen needles (order number [id number])  Please send scripts to [place] and ensure she has a F/U at [facility].    Will continue to follow while inpatient.  Thank you, Reche Dixon, MSN, [place] Diabetes Coordinator Inpatient Diabetes Program [phone] (team pager from 8a-5p)

## 2022-05-17 NOTE — Progress Notes (Signed)
Mobility Specialist Progress Note:   05/17/22 1223  Mobility  Activity Ambulated with assistance in hallway  Level of Assistance Contact guard assist, steadying assist  Assistive Device Front wheel walker  Distance Ambulated (ft) 50 ft  Activity Response Tolerated well  $Mobility charge 1 Mobility   Pt received in bed and agreeable. C/o generalized fatigue. Pt left in bed with all needs met and call bell in reach.   Sharla Tankard Mobility Specialist-Acute Rehab Secure Chat only

## 2022-05-18 ENCOUNTER — Telehealth: Payer: Self-pay

## 2022-05-18 NOTE — Telephone Encounter (Signed)
Transition Care Management Follow-up Telephone Call Date of discharge and from where: 05/17/2022, [facility] How have you been since you were released from the hospital? She said she has a boil on her buttocks that is the size of an egg. It is extremely painful and she can't sit down. She was at Urgent Care in [place] to have it assessed.  She stated that she told the hospital nurse but nothing was done.  Any questions or concerns? Yes- noted above regarding the boil.  Items Reviewed: Did the pt receive and understand the discharge instructions provided? Yes  Medications obtained and verified? Yes - she stated that she has all of her medications as well as a working glucometer.  She has not checked her blood sugar this morning.  Other? No  Any new allergies since your discharge? No  Dietary orders reviewed? No Do you have support at home? Yes   Home Care and Equipment/Supplies: Were home health services ordered? no If so, what is the name of the agency? N/a  Has the agency set up a time to come to the patient's home? not applicable Were any new equipment or medical supplies ordered?  Yes: RW What is the name of the medical supply agency? She stated that the hospital gave it to her.  Were you able to get the supplies/equipment? yes Do you have any questions related to the use of the equipment or supplies? No  Functional Questionnaire: (I = Independent and D = Dependent) ADLs: ambulates with RW, needs some assistance with ADLs  Follow up appointments reviewed:  PCP Hospital f/u appt confirmed? Yes  Scheduled to see Durene Fruits, NP - 05/22/2022. Appointment not available with Ms Raul Del, NP at [facility]  until the end of October.  [facility] f/u appt confirmed?  None scheduled at this time    Are transportation arrangements needed?  She said that transportation can be a problem at times   If their condition worsens, is the pt aware to call PCP or go to the Emergency Dept.?  Yes Was the patient provided with contact information for the PCP's office or ED? Yes- the # for PCE was text to the patient as she requested Was to pt encouraged to call back with questions or concerns? Yes

## 2022-05-18 NOTE — Progress Notes (Signed)
TRANSITION OF CARE VISIT    Date of Admission: 05/14/2022  Date of Discharge: 05/17/2022  Transitions of Care Call:   Discharged from: [facility]  Recommendations at discharge:    Follow up with PCP within 1-2 weeks and repeat CBC, CMP, Mag, Phos within 1 week Follow up with Endocrinology in the outpatient setting    Discharge Diagnoses: Principal Problem:   Hyperosmolar hyperglycemic state (HHS) ([place]) Active Problems:   Poorly controlled type 2 diabetes mellitus with complication ([place])   Essential hypertension   Obesity   Asthma   UTI (urinary tract infection)   Mixed hyperlipidemia   Hyperglycemia   AKI (acute kidney injury) ([place])   Resolved Problems:   * No resolved hospital problems. *   Summary of Admission per DO note:  Assessment and Plan: Hyperglycemic hyperosmolar state and known diabetic secondary to noncompliance. Diabetes mellitus type 2 uncontrolled with hyperglycemia with associated diabetic neuropathy. --Patient takes insulin perhaps 3 days/week.  She has difficulty affording her medications. --Remains very hyperglycemic.  Increase long-acting insulin.  Increase meal coverage. -- Regimen adjusted and prescription sent to the [facility] and they are affordable for the patient.  Patient is medically stable to be discharged and spoke with the diabetes education coordinator and will go home with home health -CBGs ranging from 158-300 -Blood sugars have been adjusted and insulin regimen recommendations have been made by the diabetes education coordinator and the scripts have been called in for the patient.  She will need close follow-up with PCP and endocrinology in outpatient setting given her noncompliance   AKI --Secondary to volume loss secondary to hyperglycemia.  (baseline 0.8).  Patient presented with a BUN/creatinine 40/2.65 -- Appears nearly resolved at this point.  Expect  recovery to baseline with oral intake -Patient's BUNs/creatinine is improved is not close to 4.0 to -Held her lisinopril and hydrochlorothiazide for now but can resume on discharge.   UTI with hematuria --Reports dysuria.  Empiric 3-day regimen of antibiotics now complete   Essential hypertension -- Stable.  Lisinopril and hydrochlorothiazide on hold while hospitalized but can be resumed in outpatient setting   Hyperlipidemia --Resume statin   Normocytic Anemia -Patient's hemoglobin/hematocrit is now 11.8/34.6 Check anemia, outpatient setting Continue monitor for signs or symptoms of bleeding; no overt bleeding noted -Repeat CBC within 1 week   Depression -- Resume sertraline   GERD -- PPI therapy   Overweight -Complicates overall prognosis and care -Estimated body mass index is 29.7 kg/m as calculated from the following:   Height as of 01/25/22: _0  (1.626 m).   Weight as of 01/25/22: 78.5 kg.  -Weight Loss and Dietary Counseling given   Consultants: Diabetes Education Coordinator  Procedures performed: None  Disposition: Home health Diet recommendation:  Discharge Diet Orders (From admission, onward)   Today's visit 05/22/2022: - Reports doing well on Basaglar and Humalog insulins. Home blood sugars in the 300's. She is trying to monitor what she eats.  - Doing well on blood pressure medication. States she did not take this morning because she thought today's appointment was fasting. Reports home blood pressures on average in the 120's/70's. - She is taking Atorvastatin without issues.  - She is taking Protonix without issues.  - No issues with asthma medication.  - She is established with [facility] for management and monitoring of abscess of left buttock. - Reports having challenges remembering to take her prescriptions. She would like to get established with patient care services  to assist with making sure she is taking medications as prescribed  and some activities of daily living.   Patient/Caregiver self-reported problems/concerns: see above  MEDICATIONS  Medication Reconciliation conducted with patient/caregiver? (Yes/ No): yes  New medications prescribed/discontinued upon discharge? (Yes/No): yes  Barriers identified related to medications: no  LABS  Lab Reviewed (Yes/No/NA): yes  PHYSICAL EXAM:  Vitals:   05/22/22 0855 05/22/22 1049  BP: (!) 153/85 (!) 149/73  Pulse: 78   Resp: 16   Temp: 98.3 F (36.8 C)   SpO2: 100%     Physical Exam HENT:     Head: Normocephalic and atraumatic.  Eyes:     Extraocular Movements: Extraocular movements intact.     Conjunctiva/sclera: Conjunctivae normal.     Pupils: Pupils are equal, round, and reactive to light.  Cardiovascular:     Rate and Rhythm: Normal rate and regular rhythm.     Pulses: Normal pulses.     Heart sounds: Normal heart sounds.  Pulmonary:     Effort: Pulmonary effort is normal.     Breath sounds: Normal breath sounds.  Musculoskeletal:     Cervical back: Normal range of motion and neck supple.  Neurological:     General: No focal deficit present.     Mental Status: She is alert and oriented to person, place, and time.  Psychiatric:        Mood and Affect: Mood normal.        Behavior: Behavior normal.    ASSESSMENT AND PLAN: 1. Hospital discharge follow-up - Reviewed hospital course, current medications, ensured proper follow-up in place, and addressed concerns.  - Update labs per hospital discharge recommendations.  - CBC - CMP14+EGFR - Magnesium - Phosphorus  2. Hyperosmolar hyperglycemic state (HHS) ([place]) 3. Uncontrolled type 2 diabetes mellitus with hyperglycemia ([place]) 4. Diabetic polyneuropathy associated with type 2 diabetes mellitus (H[facility]) - Hemoglobin A1c > 15.5 on 05/15/2022. - Continue Insulin Glargine  and Insulin Lispro as prescribed. No refills needed as of present.  - Discussed the importance of healthy eating habits,  low-carbohydrate diet, low-sugar diet, regular aerobic exercise (at least 150 minutes a week as tolerated) and medication compliance to achieve or maintain control of diabetes. - Referral to Endocrinology for further evaluation and management.  - Ambulatory referral to Endocrinology  5. Essential hypertension - Blood pressure not at goal during today's visit. Patient asymptomatic without red flag symptoms and reports she did not take blood pressure medication prior to today's appointment.  - Continue Lisinopril-Hydrochlorothiazide as prescribed. No refills needed as of present.  - Counseled on blood pressure goal of less than 130/80, low-sodium, DASH diet, medication compliance, and 150 minutes of moderate intensity exercise per week as tolerated. Counseled on medication adherence and adverse effects. - Follow-up with primary provider in 2 weeks or sooner if needed for blood pressure check.  6. Mixed hyperlipidemia - Continue Atorvastatin as prescribed. No refills needed as of present.  - Follow-up with primary provider as scheduled.   7. AKI (acute kidney injury) ([place]) 8. Acute cystitis with hematuria - Empiric 3-day regimen of antibiotics completed prior to hospital discharge.   9. Normocytic anemia - Updating CBC today (see #1).  10. Depression, unspecified depression type - Continue Sertraline as prescribed.  - Follow-up with primary provider as scheduled. - sertraline (ZOLOFT) 50 MG tablet; Take 1 tablet (50 mg total) by mouth daily.  Dispense: 30 tablet; Refill: 2  11. Gastroesophageal reflux disease, unspecified whether esophagitis present - Continue Pantoprazole as  prescribed.  - Follow-up with primary provider as scheduled.  - pantoprazole (PROTONIX) 40 MG tablet; Take 1 tablet (40 mg total) by mouth daily.  Dispense: 30 tablet; Refill: 2  12. Asthma, unspecified asthma severity, unspecified whether complicated, unspecified whether persistent - Continue Albuterol inhaler as  prescribed.  - Follow-up with primary provider as scheduled. - albuterol (VENTOLIN HFA) 108 (90 Base) MCG/ACT inhaler; Inhale 1-2 puffs into the lungs every 6 (six) hours as needed for wheezing or shortness of breath.  Dispense: 8.5 g; Refill: 2  13. Need for home health care - Patient requests patient care services to assist with making sure she is taking medications as prescribed and some activities of daily living in the home. Eden Lathe, RN case manager notified.    PATIENT EDUCATION PROVIDED: See AVS   FOLLOW-UP (Include any further testing or referrals):  - Follow-up in 2 weeks or sooner if needed with primary provider Geryl Rankins, NP. - Referral to Endocrinology.   Patient was given clear instructions to go to Emergency Department or return to medical center if symptoms don't improve, worsen, or new problems develop.The patient verbalized understanding.

## 2022-05-22 ENCOUNTER — Ambulatory Visit (INDEPENDENT_AMBULATORY_CARE_PROVIDER_SITE_OTHER): Payer: Commercial Managed Care - HMO | Admitting: Family

## 2022-05-22 ENCOUNTER — Telehealth: Payer: Self-pay | Admitting: Family

## 2022-05-22 ENCOUNTER — Encounter: Payer: Self-pay | Admitting: Family

## 2022-05-22 VITALS — BP 149/73 | HR 78 | Temp 98.3°F | Resp 16 | Ht 64.02 in | Wt 174.0 lb

## 2022-05-22 DIAGNOSIS — Z09 Encounter for follow-up examination after completed treatment for conditions other than malignant neoplasm: Secondary | ICD-10-CM

## 2022-05-22 DIAGNOSIS — I10 Essential (primary) hypertension: Secondary | ICD-10-CM

## 2022-05-22 DIAGNOSIS — E871 Hypo-osmolality and hyponatremia: Secondary | ICD-10-CM

## 2022-05-22 DIAGNOSIS — E1100 Type 2 diabetes mellitus with hyperosmolarity without nonketotic hyperglycemic-hyperosmolar coma (NKHHC): Secondary | ICD-10-CM

## 2022-05-22 DIAGNOSIS — E782 Mixed hyperlipidemia: Secondary | ICD-10-CM

## 2022-05-22 DIAGNOSIS — E1165 Type 2 diabetes mellitus with hyperglycemia: Secondary | ICD-10-CM

## 2022-05-22 DIAGNOSIS — J45909 Unspecified asthma, uncomplicated: Secondary | ICD-10-CM

## 2022-05-22 DIAGNOSIS — R739 Hyperglycemia, unspecified: Secondary | ICD-10-CM

## 2022-05-22 DIAGNOSIS — D649 Anemia, unspecified: Secondary | ICD-10-CM

## 2022-05-22 DIAGNOSIS — N179 Acute kidney failure, unspecified: Secondary | ICD-10-CM

## 2022-05-22 DIAGNOSIS — E1142 Type 2 diabetes mellitus with diabetic polyneuropathy: Secondary | ICD-10-CM

## 2022-05-22 DIAGNOSIS — Z742 Need for assistance at home and no other household member able to render care: Secondary | ICD-10-CM

## 2022-05-22 DIAGNOSIS — E1141 Type 2 diabetes mellitus with diabetic mononeuropathy: Secondary | ICD-10-CM

## 2022-05-22 DIAGNOSIS — K219 Gastro-esophageal reflux disease without esophagitis: Secondary | ICD-10-CM

## 2022-05-22 DIAGNOSIS — N390 Urinary tract infection, site not specified: Secondary | ICD-10-CM

## 2022-05-22 DIAGNOSIS — N3001 Acute cystitis with hematuria: Secondary | ICD-10-CM

## 2022-05-22 DIAGNOSIS — F32A Depression, unspecified: Secondary | ICD-10-CM

## 2022-05-22 MED ORDER — PANTOPRAZOLE SODIUM 40 MG PO TBEC
40.0000 mg | DELAYED_RELEASE_TABLET | Freq: Every day | ORAL | 2 refills | Status: DC
  Filled 2022-05-22 – 2022-08-22 (×3): qty 30, 30d supply, fill #0
  Filled 2022-11-17: qty 30, 30d supply, fill #1
  Filled 2023-01-30 (×2): qty 30, 30d supply, fill #0
  Filled 2023-01-30 – 2023-03-09 (×2): qty 30, 30d supply, fill #1

## 2022-05-22 MED ORDER — SERTRALINE HCL 50 MG PO TABS
50.0000 mg | ORAL_TABLET | Freq: Every day | ORAL | 2 refills | Status: DC
  Filled 2022-05-22 – 2022-06-26 (×2): qty 30, 30d supply, fill #0

## 2022-05-22 MED ORDER — ALBUTEROL SULFATE HFA 108 (90 BASE) MCG/ACT IN AERS
1.0000 | INHALATION_SPRAY | Freq: Four times a day (QID) | RESPIRATORY_TRACT | 2 refills | Status: DC | PRN
  Filled 2022-05-22: qty 6.7, 28d supply, fill #0

## 2022-05-22 NOTE — Progress Notes (Signed)
Pt presents for transition of care -needs PCS home services

## 2022-05-23 ENCOUNTER — Other Ambulatory Visit: Payer: Self-pay

## 2022-05-23 ENCOUNTER — Telehealth: Payer: Self-pay

## 2022-05-23 LAB — CBC
Hematocrit: 37.4 % (ref 34.0–46.6)
Hemoglobin: 12 g/dL (ref 11.1–15.9)
MCH: 28.7 pg (ref 26.6–33.0)
MCHC: 32.1 g/dL (ref 31.5–35.7)
MCV: 90 fL (ref 79–97)
Platelets: 503 10*3/uL — ABNORMAL HIGH (ref 150–450)
RBC: 4.18 x10E6/uL (ref 3.77–5.28)
RDW: 11.6 % — ABNORMAL LOW (ref 11.7–15.4)
WBC: 7.5 10*3/uL (ref 3.4–10.8)

## 2022-05-23 LAB — CMP14+EGFR
ALT: 14 IU/L (ref 0–32)
AST: 12 IU/L (ref 0–40)
Albumin/Globulin Ratio: 1.1 — ABNORMAL LOW (ref 1.2–2.2)
Albumin: 3.7 g/dL — ABNORMAL LOW (ref 3.8–4.9)
Alkaline Phosphatase: 103 IU/L (ref 44–121)
BUN/Creatinine Ratio: 16 (ref 9–23)
BUN: 18 mg/dL (ref 6–24)
Bilirubin Total: 0.2 mg/dL (ref 0.0–1.2)
CO2: 24 mmol/L (ref 20–29)
Calcium: 9.5 mg/dL (ref 8.7–10.2)
Chloride: 97 mmol/L (ref 96–106)
Creatinine, Ser: 1.14 mg/dL — ABNORMAL HIGH (ref 0.57–1.00)
Globulin, Total: 3.3 g/dL (ref 1.5–4.5)
Glucose: 520 mg/dL (ref 70–99)
Potassium: 4.7 mmol/L (ref 3.5–5.2)
Sodium: 137 mmol/L (ref 134–144)
Total Protein: 7 g/dL (ref 6.0–8.5)
eGFR: 56 mL/min/{1.73_m2} — ABNORMAL LOW (ref >59)

## 2022-05-23 LAB — MAGNESIUM: Magnesium: 2 mg/dL (ref 1.6–2.3)

## 2022-05-23 LAB — PHOSPHORUS: Phosphorus: 3.6 mg/dL (ref 3.0–4.3)

## 2022-05-23 NOTE — Telephone Encounter (Signed)
Direct patient to emergency department for critical glucose.

## 2022-05-23 NOTE — Telephone Encounter (Signed)
Just a FYI

## 2022-05-23 NOTE — Telephone Encounter (Signed)
Thanks Amy. I just called her and left a message requesting a call back.  She does not have Medicaid for personal care services and I will explain that to her when she calls back.  [employer] plans do not usually pay for PCS>

## 2022-05-23 NOTE — Telephone Encounter (Signed)
Received Critical value of Glucose 520 that was drawn 05/22/22 Sent teams message to Doloris Hall and she acknowledged result. ASked if she wuold like this NT to call and she stated "no"

## 2022-05-25 NOTE — Telephone Encounter (Signed)
MA has reached out to patient to discuss she was unsuccessful.

## 2022-05-29 ENCOUNTER — Other Ambulatory Visit: Payer: Self-pay

## 2022-06-20 ENCOUNTER — Ambulatory Visit: Payer: Self-pay | Attending: Nurse Practitioner | Admitting: Nurse Practitioner

## 2022-06-26 ENCOUNTER — Other Ambulatory Visit: Payer: Self-pay

## 2022-07-12 ENCOUNTER — Ambulatory Visit: Payer: Commercial Managed Care - HMO | Admitting: Registered"

## 2022-07-28 ENCOUNTER — Other Ambulatory Visit: Payer: Self-pay | Admitting: Nurse Practitioner

## 2022-07-28 ENCOUNTER — Other Ambulatory Visit: Payer: Self-pay

## 2022-07-28 DIAGNOSIS — I10 Essential (primary) hypertension: Secondary | ICD-10-CM

## 2022-07-28 MED ORDER — LISINOPRIL-HYDROCHLOROTHIAZIDE 20-25 MG PO TABS
ORAL_TABLET | ORAL | 0 refills | Status: DC
  Filled 2022-07-28: qty 30, 30d supply, fill #0

## 2022-07-31 ENCOUNTER — Other Ambulatory Visit: Payer: Self-pay

## 2022-08-22 ENCOUNTER — Other Ambulatory Visit: Payer: Self-pay

## 2022-08-22 ENCOUNTER — Other Ambulatory Visit: Payer: Self-pay | Admitting: Family Medicine

## 2022-08-22 DIAGNOSIS — I10 Essential (primary) hypertension: Secondary | ICD-10-CM

## 2022-08-23 ENCOUNTER — Other Ambulatory Visit (HOSPITAL_COMMUNITY): Payer: Self-pay

## 2022-08-23 ENCOUNTER — Other Ambulatory Visit: Payer: Self-pay

## 2022-08-24 ENCOUNTER — Other Ambulatory Visit: Payer: Self-pay

## 2022-08-24 ENCOUNTER — Other Ambulatory Visit: Payer: Self-pay | Admitting: Nurse Practitioner

## 2022-08-24 DIAGNOSIS — E1165 Type 2 diabetes mellitus with hyperglycemia: Secondary | ICD-10-CM

## 2022-08-25 ENCOUNTER — Other Ambulatory Visit: Payer: Self-pay | Admitting: Nurse Practitioner

## 2022-08-25 ENCOUNTER — Other Ambulatory Visit: Payer: Self-pay

## 2022-08-25 DIAGNOSIS — I10 Essential (primary) hypertension: Secondary | ICD-10-CM

## 2022-08-25 DIAGNOSIS — E1165 Type 2 diabetes mellitus with hyperglycemia: Secondary | ICD-10-CM

## 2022-08-25 NOTE — Telephone Encounter (Signed)
Medication Refill - Medication: Insulin Glargine (BASAGLAR KWIKPEN) 100 UNIT/ML [[id number]]  insulin lispro (HUMALOG) 100 UNIT/ML KwikPen [[id number]]  lisinopril-hydrochlorothiazide (ZESTORETIC) 20-25 MG tablet [[id number]]    Has the patient contacted their pharmacy? Yes.   (Agent: If no, request that the patient contact the pharmacy for the refill. If patient does not wish to contact the pharmacy document the reason why and proceed with request.) (Agent: If yes, when and what did the pharmacy advise?) call and schedule appt / pt scheduled appt   Preferred Pharmacy (with phone number or street name): [place]  Has the patient been seen for an appointment in the last year OR does the patient have an upcoming appointment? Yes.    Agent: Please be advised that RX refills may take up to 3 business days. We ask that you follow-up with your pharmacy.

## 2022-08-26 NOTE — Telephone Encounter (Signed)
Requested medication (s) are due for refill today: routing for aprroval  Requested medication (s) are on the active medication list: yes  Last refill:  07/28/22 for lisinopril and 05/17/22 for insulin  Future visit scheduled: yes  Notes to clinic:  Unable to refill per protocol, courtesy refill already given, routing for provider approval. Patient has OV scheduled in Jan.     Requested Prescriptions  Pending Prescriptions Disp Refills   Insulin Glargine (BASAGLAR KWIKPEN) 100 UNIT/ML 15 mL 2    Sig: Inject 40 Units into the skin 2 (two) times daily.     Endocrinology:  Diabetes - Insulins Failed - 08/25/2022  9:57 AM      Failed - HBA1C is between 0 and 7.9 and within 180 days    Hgb A1c MFr Bld  Date Value Ref Range Status  05/15/2022 >15.5 (H) 4.8 - 5.6 % Final    Comment:    (NOTE)         Prediabetes: 5.7 - 6.4         Diabetes: >6.4         Glycemic control for adults with diabetes: <7.0          Passed - Valid encounter within last 6 months    Recent Outpatient Visits           3 months ago Hospital discharge follow-up   Primary Care at [facility], Amy J, NP   5 months ago Uncontrolled type 2 diabetes mellitus with hyperglycemia [facility])   [place], Jarome Matin, RPH-CPP   7 months ago Poorly controlled type 2 diabetes mellitus with complication [facility])   Primary Care at [facility], Cari S, PA-C   8 months ago Poorly controlled type 2 diabetes mellitus with complication [facility])   [facility]   1 year ago Uncontrolled type 2 diabetes mellitus with hyperglycemia ([place])   [place], Vernia Buff, NP       Future Appointments             In 1 month Gildardo Pounds, NP [place]             insulin lispro (HUMALOG) 100 UNIT/ML KwikPen 15 mL 0    Sig: Inject 12 Units into the skin with breakfast,  with lunch, and with evening meal.     Endocrinology:  Diabetes - Insulins Failed - 08/25/2022  9:57 AM      Failed - HBA1C is between 0 and 7.9 and within 180 days    Hgb A1c MFr Bld  Date Value Ref Range Status  05/15/2022 >15.5 (H) 4.8 - 5.6 % Final    Comment:    (NOTE)         Prediabetes: 5.7 - 6.4         Diabetes: >6.4         Glycemic control for adults with diabetes: <7.0          Passed - Valid encounter within last 6 months    Recent Outpatient Visits           3 months ago Hospital discharge follow-up   Primary Care at [facility], Amy J, NP   5 months ago Uncontrolled type 2 diabetes mellitus with hyperglycemia [facility])   [place], Annie Main L, RPH-CPP   7 months  ago Poorly controlled type 2 diabetes mellitus with complication [facility])   Primary Care at [facility], Cari S, PA-C   8 months ago Poorly controlled type 2 diabetes mellitus with complication [facility])   Primary Care at [facility], Cari S, PA-C   1 year ago Uncontrolled type 2 diabetes mellitus with hyperglycemia [facility])   [place], Vernia Buff, NP       Future Appointments             In 1 month Gildardo Pounds, NP [place]             lisinopril-hydrochlorothiazide (ZESTORETIC) 20-25 MG tablet 30 tablet 0    Sig: Take 1 tablet by mouth daily (Must have office visit for refills)     Cardiovascular:  ACEI + Diuretic Combos Failed - 08/25/2022  9:57 AM      Failed - Cr in normal range and within 180 days    Creat  Date Value Ref Range Status  06/11/2017 0.88 0.50 - 1.05 mg/dL Final    Comment:    For patients >62 years of age, the reference limit for Creatinine is approximately 13% higher for people identified as African-American. .    Creatinine, Ser  Date Value Ref Range Status  05/22/2022 1.14 (H) 0.57 - 1.00 mg/dL Final   Creatinine, Urine   Date Value Ref Range Status  02/02/2016 128 20 - 320 mg/dL Final         Failed - Last BP in normal range    BP Readings from Last 1 Encounters:  05/22/22 (!) 149/73         Passed - Na in normal range and within 180 days    Sodium  Date Value Ref Range Status  05/22/2022 137 134 - 144 mmol/L Final         Passed - K in normal range and within 180 days    Potassium  Date Value Ref Range Status  05/22/2022 4.7 3.5 - 5.2 mmol/L Final         Passed - eGFR is 30 or above and within 180 days    GFR, Est African American  Date Value Ref Range Status  09/18/2016 >89 >=60 mL/min Final   GFR calc Af Amer  Date Value Ref Range Status  01/17/2020 >60 >60 mL/min Final   GFR, Est Non African American  Date Value Ref Range Status  09/18/2016 78 >=60 mL/min Final   GFR, Estimated  Date Value Ref Range Status  05/17/2022 >60 >60 mL/min Final    Comment:    (NOTE) Calculated using the CKD-EPI Creatinine Equation (2021)    eGFR  Date Value Ref Range Status  05/22/2022 56 (L) >59 mL/min/1.73 Final         Passed - Patient is not pregnant      Passed - Valid encounter within last 6 months    Recent Outpatient Visits           3 months ago Hospital discharge follow-up   Primary Care at [facility], Amy J, NP   5 months ago Uncontrolled type 2 diabetes mellitus with hyperglycemia [facility])   [place], Jarome Matin, RPH-CPP   7 months ago Poorly controlled type 2 diabetes mellitus with complication [facility])   Primary Care at [facility], Cari S, PA-C   8 months ago Poorly controlled type 2 diabetes  mellitus with complication [facility])   Primary Care at [facility], [place], PA-C   1 year ago Uncontrolled type 2 diabetes mellitus with hyperglycemia [facility])   [place], Zelda W, NP       Future Appointments             In 1 month Gildardo Pounds, NP [place]

## 2022-08-29 ENCOUNTER — Other Ambulatory Visit: Payer: Self-pay | Admitting: Pharmacist

## 2022-08-29 ENCOUNTER — Other Ambulatory Visit: Payer: Self-pay

## 2022-08-29 DIAGNOSIS — I10 Essential (primary) hypertension: Secondary | ICD-10-CM

## 2022-08-29 DIAGNOSIS — E1165 Type 2 diabetes mellitus with hyperglycemia: Secondary | ICD-10-CM

## 2022-08-29 MED ORDER — LISINOPRIL-HYDROCHLOROTHIAZIDE 20-25 MG PO TABS
1.0000 | ORAL_TABLET | Freq: Every day | ORAL | 0 refills | Status: DC
  Filled 2022-08-29: qty 30, 30d supply, fill #0

## 2022-08-29 MED ORDER — INSULIN LISPRO (1 UNIT DIAL) 100 UNIT/ML (KWIKPEN)
12.0000 [IU] | PEN_INJECTOR | Freq: Three times a day (TID) | SUBCUTANEOUS | 0 refills | Status: DC
  Filled 2022-08-29: qty 9, 25d supply, fill #0

## 2022-08-29 MED ORDER — BASAGLAR KWIKPEN 100 UNIT/ML ~~LOC~~ SOPN
40.0000 [IU] | PEN_INJECTOR | Freq: Two times a day (BID) | SUBCUTANEOUS | 0 refills | Status: DC
  Filled 2022-08-29: qty 24, 30d supply, fill #0

## 2022-08-29 MED ORDER — INSULIN PEN NEEDLE 32G X 4 MM MISC
1.0000 | Freq: Three times a day (TID) | 0 refills | Status: DC
  Filled 2022-08-29 – 2023-03-09 (×6): qty 100, 25d supply, fill #0

## 2022-08-30 ENCOUNTER — Other Ambulatory Visit: Payer: Self-pay

## 2022-08-30 ENCOUNTER — Encounter: Payer: Self-pay | Admitting: Registered"

## 2022-09-29 ENCOUNTER — Ambulatory Visit: Payer: Medicaid Other | Attending: Nurse Practitioner | Admitting: Nurse Practitioner

## 2022-09-29 ENCOUNTER — Other Ambulatory Visit: Payer: Self-pay

## 2022-09-29 ENCOUNTER — Encounter: Payer: Self-pay | Admitting: Nurse Practitioner

## 2022-09-29 ENCOUNTER — Other Ambulatory Visit (HOSPITAL_COMMUNITY)
Admission: RE | Admit: 2022-09-29 | Discharge: 2022-09-29 | Disposition: A | Discharge status: Home / Self Care | Payer: BLUE CROSS/BLUE SHIELD | Source: Ambulatory Visit | Attending: Nurse Practitioner | Admitting: Nurse Practitioner

## 2022-09-29 VITALS — BP 115/70 | HR 98 | Ht 64.0 in | Wt 190.6 lb

## 2022-09-29 DIAGNOSIS — E1142 Type 2 diabetes mellitus with diabetic polyneuropathy: Secondary | ICD-10-CM

## 2022-09-29 DIAGNOSIS — E230 Hypopituitarism: Secondary | ICD-10-CM

## 2022-09-29 DIAGNOSIS — F331 Major depressive disorder, recurrent, moderate: Secondary | ICD-10-CM

## 2022-09-29 DIAGNOSIS — J418 Mixed simple and mucopurulent chronic bronchitis: Secondary | ICD-10-CM

## 2022-09-29 DIAGNOSIS — N760 Acute vaginitis: Secondary | ICD-10-CM

## 2022-09-29 DIAGNOSIS — E782 Mixed hyperlipidemia: Secondary | ICD-10-CM

## 2022-09-29 DIAGNOSIS — Z1231 Encounter for screening mammogram for malignant neoplasm of breast: Secondary | ICD-10-CM | POA: Diagnosis not present

## 2022-09-29 DIAGNOSIS — Z7251 High risk heterosexual behavior: Secondary | ICD-10-CM

## 2022-09-29 DIAGNOSIS — I10 Essential (primary) hypertension: Secondary | ICD-10-CM | POA: Diagnosis not present

## 2022-09-29 DIAGNOSIS — F17200 Nicotine dependence, unspecified, uncomplicated: Secondary | ICD-10-CM | POA: Diagnosis not present

## 2022-09-29 DIAGNOSIS — K089 Disorder of teeth and supporting structures, unspecified: Secondary | ICD-10-CM

## 2022-09-29 DIAGNOSIS — R309 Painful micturition, unspecified: Secondary | ICD-10-CM | POA: Diagnosis not present

## 2022-09-29 DIAGNOSIS — L2082 Flexural eczema: Secondary | ICD-10-CM

## 2022-09-29 DIAGNOSIS — Z1211 Encounter for screening for malignant neoplasm of colon: Secondary | ICD-10-CM

## 2022-09-29 LAB — POCT URINALYSIS DIP (CLINITEK)
Bilirubin, UA: NEGATIVE
Glucose, UA: 100 mg/dL — AB
Nitrite, UA: POSITIVE — AB
POC PROTEIN,UA: >=300 — AB
Spec Grav, UA: 1.02 (ref 1.010–1.025)
Urobilinogen, UA: 1 E.U./dL
pH, UA: 5 (ref 5.0–8.0)

## 2022-09-29 MED ORDER — TRIAMCINOLONE ACETONIDE 0.025 % EX OINT
1.0000 | TOPICAL_OINTMENT | Freq: Two times a day (BID) | CUTANEOUS | 1 refills | Status: DC
  Filled 2022-09-29: qty 30, 30d supply, fill #0
  Filled 2022-11-17: qty 30, 30d supply, fill #1

## 2022-09-29 MED ORDER — GABAPENTIN 600 MG PO TABS
600.0000 mg | ORAL_TABLET | Freq: Every day | ORAL | 1 refills | Status: AC | PRN
  Filled 2022-09-29: qty 90, 90d supply, fill #0
  Filled 2022-11-17: qty 90, 90d supply, fill #1
  Filled 2023-01-30: qty 90, 90d supply, fill #0

## 2022-09-29 MED ORDER — LANTUS SOLOSTAR 100 UNIT/ML ~~LOC~~ SOPN
40.0000 [IU] | PEN_INJECTOR | Freq: Two times a day (BID) | SUBCUTANEOUS | 3 refills | Status: DC
  Filled 2022-09-29: qty 30, 38d supply, fill #0
  Filled 2022-11-17 (×2): qty 30, 38d supply, fill #1
  Filled 2023-01-30 (×2): qty 30, 38d supply, fill #0
  Filled 2023-01-30: qty 30, 38d supply, fill #2
  Filled 2023-03-09: qty 30, 38d supply, fill #1
  Filled 2023-04-20: qty 30, 38d supply, fill #2

## 2022-09-29 MED ORDER — INSULIN LISPRO (1 UNIT DIAL) 100 UNIT/ML (KWIKPEN)
12.0000 [IU] | PEN_INJECTOR | Freq: Three times a day (TID) | SUBCUTANEOUS | 11 refills | Status: DC
  Filled 2022-09-29: qty 15, 42d supply, fill #0
  Filled 2022-11-17: qty 15, 42d supply, fill #1
  Filled 2023-01-30: qty 15, 42d supply, fill #0
  Filled 2023-01-30: qty 15, 42d supply, fill #2
  Filled 2023-01-30: qty 15, 42d supply, fill #0
  Filled 2023-03-09: qty 15, 42d supply, fill #1
  Filled 2023-04-20: qty 15, 42d supply, fill #2
  Filled 2023-07-25: qty 9, 25d supply, fill #3

## 2022-09-29 MED ORDER — ACCU-CHEK GUIDE VI STRP
ORAL_STRIP | 12 refills | Status: DC
  Filled 2022-09-29: qty 200, 67d supply, fill #0
  Filled 2022-11-17: qty 200, 66d supply, fill #0

## 2022-09-29 MED ORDER — NITROFURANTOIN MONOHYD MACRO 100 MG PO CAPS
100.0000 mg | ORAL_CAPSULE | Freq: Two times a day (BID) | ORAL | 0 refills | Status: DC
  Filled 2022-09-29: qty 10, 5d supply, fill #0

## 2022-09-29 MED ORDER — LISINOPRIL-HYDROCHLOROTHIAZIDE 20-25 MG PO TABS
1.0000 | ORAL_TABLET | Freq: Every day | ORAL | 1 refills | Status: DC
  Filled 2022-09-29: qty 90, 90d supply, fill #0
  Filled 2022-11-17 (×3): qty 90, 90d supply, fill #1

## 2022-09-29 MED ORDER — CHLORHEXIDINE GLUCONATE 0.12 % MT SOLN
15.0000 mL | Freq: Two times a day (BID) | OROMUCOSAL | 3 refills | Status: DC
  Filled 2022-09-29: qty 473, 16d supply, fill #0
  Filled 2022-11-17 (×3): qty 473, 16d supply, fill #1

## 2022-09-29 MED ORDER — ALBUTEROL SULFATE HFA 108 (90 BASE) MCG/ACT IN AERS
1.0000 | INHALATION_SPRAY | Freq: Four times a day (QID) | RESPIRATORY_TRACT | 1 refills | Status: DC | PRN
  Filled 2022-09-29: qty 18, 25d supply, fill #0
  Filled 2022-11-17 (×3): qty 18, 25d supply, fill #1

## 2022-09-29 MED ORDER — SERTRALINE HCL 50 MG PO TABS
50.0000 mg | ORAL_TABLET | Freq: Every day | ORAL | 2 refills | Status: DC
  Filled 2022-09-29 – 2022-11-17 (×4): qty 90, 90d supply, fill #0
  Filled 2023-01-30: qty 30, 30d supply, fill #1
  Filled 2023-01-30: qty 90, 90d supply, fill #0
  Filled 2023-01-30: qty 30, 30d supply, fill #0
  Filled 2023-04-20: qty 30, 30d supply, fill #1

## 2022-09-29 MED ORDER — NITROFURANTOIN MONOHYD MACRO 100 MG PO CAPS: 100.0000 mg | ORAL_CAPSULE | Freq: Two times a day (BID) | ORAL | 0 refills | Status: AC

## 2022-09-29 MED ORDER — ACCU-CHEK SOFTCLIX LANCETS MISC
12 refills | Status: DC
  Filled 2022-09-29: qty 200, fill #0
  Filled 2022-11-17: qty 200, 90d supply, fill #0

## 2022-09-29 MED ORDER — ACCU-CHEK GUIDE W/DEVICE KIT
PACK | 0 refills | Status: DC
  Filled 2022-09-29: qty 1, 30d supply, fill #0
  Filled 2022-11-17: qty 1, 365d supply, fill #0

## 2022-09-29 MED ORDER — ATORVASTATIN CALCIUM 40 MG PO TABS
ORAL_TABLET | ORAL | 3 refills | Status: DC
  Filled 2022-09-29: qty 90, 90d supply, fill #0
  Filled 2022-11-17 (×3): qty 90, 90d supply, fill #1
  Filled 2023-01-30: qty 30, 30d supply, fill #1
  Filled 2023-01-30 (×2): qty 30, 30d supply, fill #0
  Filled 2023-03-09: qty 30, 30d supply, fill #1
  Filled 2023-04-20: qty 30, 30d supply, fill #2

## 2022-09-29 NOTE — Progress Notes (Signed)
Assessment & Plan:  Barbara Wang was seen today for medication refill, cough and back pain.  Diagnoses and all orders for this visit:  Primary hypertension Continue all antihypertensives as prescribed.  Reminded to bring in blood pressure log for follow  up appointment.  RECOMMENDATIONS: DASH/Mediterranean Diets are healthier choices for HTN.   -     CMP14+EGFR -     lisinopril-hydrochlorothiazide (ZESTORETIC) 20-25 MG tablet; Take 1 tablet by mouth daily.  Breast cancer screening by mammogram -     MM DIGITAL SCREENING BILATERAL; Future -     MM 3D SCREEN BREAST BILATERAL; Future  Tobacco dependence -     CT CHEST LUNG CA SCREEN LOW DOSE W/O CM; Future  Moderate episode of recurrent major depressive disorder (HCC) -     sertraline (ZOLOFT) 50 MG tablet; Take 1 tablet (50 mg total) by mouth daily. FOR DEPRESSION  Diabetic polyneuropathy associated with type 2 diabetes mellitus (HCC) -     Hemoglobin A1c -     Ambulatory referral to Podiatry -     Accu-Chek Softclix Lancets lancets; Use as instructed. Check blood glucose by fingerstick twice per day. -     glucose blood (ACCU-CHEK GUIDE) test strip; Use as instructed. Check blood glucose by fingerstick three times per day -     Blood Glucose Monitoring Suppl (ACCU-CHEK GUIDE) w/Device KIT; Use to check blood sugar three times daily. -     atorvastatin (LIPITOR) 40 MG tablet; Take 1 tablet (40 mg total) by mouth daily at 6pm. -     gabapentin (NEURONTIN) 600 MG tablet; Take 1 tablet (600 mg total) by mouth daily as needed (for neuropathy in hands and feet). -     insulin lispro (HUMALOG KWIKPEN) 100 UNIT/ML KwikPen; Inject 12 Units into the skin 3 (three) times daily. -     insulin glargine (LANTUS SOLOSTAR) 100 UNIT/ML Solostar Pen; Inject 40 Units into the skin 2 (two) times daily. Continue blood sugar control as discussed in office today, low carbohydrate diet, and regular physical exercise as tolerated, 150 minutes per week (30  min each day, 5 days per week, or 50 min 3 days per week). Keep blood sugar logs with fasting goal of 90-130 mg/dl, post prandial (after you eat) less than 180.  For Hypoglycemia: BS <60 and Hyperglycemia BS >400; contact the clinic ASAP. Annual eye exams and foot exams are recommended.   Hypopituitarism ([place]) -     Prolactin  Mixed hyperlipidemia -     Lipid panel INSTRUCTIONS: Work on a low fat, heart healthy diet and participate in regular aerobic exercise program by working out at least 150 minutes per week; 5 days a week-30 minutes per day. Avoid red meat/beef/steak,  fried foods. junk foods, sodas, sugary drinks, unhealthy snacking, alcohol and smoking.  Drink at least 80 oz of water per day and monitor your carbohydrate intake daily.    Poor dentition -     chlorhexidine (PERIDEX) 0.12 % solution; Use as directed 15 mLs in the mouth or throat 2 (two) times daily. -     Ambulatory referral to Dentistry  Painful urination -     POCT URINALYSIS DIP (CLINITEK)  Acute vaginitis -     Cervicovaginal ancillary only  Mixed simple and mucopurulent chronic bronchitis (HCC) -     albuterol (PROAIR HFA) 108 (90 Base) MCG/ACT inhaler; Inhale 1-2 puffs into the lungs every 6 (six) hours as needed for wheezing or shortness of breath.  Flexural eczema -  triamcinolone (KENALOG) 0.025 % ointment; Apply 1 Application topically 2 (two) times daily. To both elbows  High risk heterosexual behavior -     HIV antibody (with reflex)  Colon cancer screening -     Ambulatory referral to Gastroenterology    Patient has been counseled on age-appropriate routine health concerns for screening and prevention. These are reviewed and up-to-date. Referrals have been placed accordingly. Immunizations are up-to-date or declined.    Subjective:   Chief Complaint  Patient presents with   Medication Refill   Cough   Back Pain    Lower back pain   HPI Barbara Wang 57 y.o. female presents to  office today for medication refills and complaints of persistent cough. She is still smoking and has a history of asthma. Currently smoking about 5 cigarettes per day.   She has a past medical history of Asthma, Brain tumor ([place]), Cellulitis (06/2015), Depression, History of hiatal hernia, Hyperlipemia, Hypertension, Neuropathy, Obesity, Pancreatitis, and Type II diabetes mellitus ([place]).    Notes increased stress and grief due to her mother passing almost 2 years ago   She has been out of her medications for quite some time. Will refill today   HTN Well controlled today.  Currently prescribed zestoretic 20-25 mg daily. BP Readings from Last 3 Encounters:  09/29/22 115/70  05/22/22 (!) 149/73  05/17/22 (!) 150/81    UTI symptoms Endorses lower pelvic pressure and dysuria. Ongoing for a few weeks.She is also sexually active. Denies any abnormal vaginal discharge.   Asthma Patient's symptoms include productive cough. Associated symptoms include  alternating nonproductive cough . The patient has been suffering from these symptoms for approximately several  months. Symptoms have been stable since their onset. Medications used in the past to treat these symptoms include beta agonist inhalers. Suspected precipitants include cold air, smoke, and temperature changes . Patient has not required Emergency Room treatment for these symptoms, and has not required hospitalization. She does continue to smoke.   DM  Poorly controlled. Poorly controlled. She has been out of her medications however her diabetes has not been controlled in some time.  We will resume Victoza 1.8 mg daily, Lantus 26 units twice daily and Humalog 12 TID. Hyperglycemic symptoms include peripheral neuropathy for which she is prescribed. LDL near goal with atorvastatin 40 mg daily.   Lab Results  Component Value Date   HGBA1C >15.5 (H) 05/15/2022    Lab Results  Component Value Date   LDLCALC 66 12/06/2021      Review of  Systems  Constitutional:  Negative for fever, malaise/fatigue and weight loss.  HENT: Negative.  Negative for nosebleeds.   Eyes: Negative.  Negative for blurred vision, double vision and photophobia.  Respiratory: Negative.  Negative for cough and shortness of breath.   Cardiovascular: Negative.  Negative for chest pain, palpitations and leg swelling.  Gastrointestinal: Negative.  Negative for heartburn, nausea and vomiting.  Genitourinary:  Positive for dysuria. Negative for flank pain, frequency, hematuria and urgency.  Musculoskeletal: Negative.  Negative for myalgias.  Skin:  Positive for itching and rash.       Endorses eczema flare on bilateral elbows  Neurological: Negative.  Negative for dizziness, focal weakness, seizures and headaches.  Psychiatric/Behavioral: Negative.  Negative for suicidal ideas.     Past Medical History:  Diagnosis Date   Asthma    Brain tumor ([place])    Cellulitis 06/2015   rt hand    Depression    History of hiatal  hernia    "it went away on it's own"   Hyperlipemia    Hypertension    Neuropathy     "feet & hands "   Obesity    Pancreatitis    Type II diabetes mellitus (HCC)    insulin dependent     Past Surgical History:  Procedure Laterality Date   ABDOMINAL HYSTERECTOMY     BREAST CYST EXCISION Right 10+ yrs ago   abscess removed   CESAREAN SECTION  1981; 1987   CHOLECYSTECTOMY  05/11/2012   Procedure: LAPAROSCOPIC CHOLECYSTECTOMY WITH INTRAOPERATIVE CHOLANGIOGRAM;  Surgeon: Gayland Curry, MD,FACS;  Location: [place];  Service: General;  Laterality: N/A;   SHOULDER ARTHROSCOPY W/ ROTATOR CUFF REPAIR Right    TUBAL LIGATION  10/25/1999   Archie Endo 01/11/2011    Family History  Problem Relation Age of Onset   Stroke Mother    Breast cancer Mother 75   Diabetes Father    Diabetes Sister    Breast cancer Maternal Aunt    Breast cancer Maternal Aunt    Breast cancer Maternal Grandmother     Social History Reviewed with no changes to be  made today.   Outpatient Medications Prior to Visit  Medication Sig Dispense Refill   acetaminophen (TYLENOL) 500 MG tablet Take 1,000 mg by mouth every 6 (six) hours as needed for moderate pain.     aspirin 81 MG EC tablet Take 1 tablet (81 mg total) by mouth daily. 90 tablet 3   Insulin Pen Needle 32G X 4 MM MISC Use 4 (four) times daily with insulin pen -  before meals and at bedtime. 100 each 0   pantoprazole (PROTONIX) 40 MG tablet Take 1 tablet (40 mg total) by mouth daily. 30 tablet 2   polyethylene glycol powder (GLYCOLAX/MIRALAX) 17 GM/SCOOP powder Take 17 g by mouth 2 (two) times daily. 238 g 0   senna-docusate (SENOKOT-S) 8.6-50 MG tablet Take 1 tablet by mouth at bedtime. 30 tablet 0   sodium chloride (OCEAN) 0.65 % SOLN nasal spray Place 1 spray into both nostrils every 4 (four) hours as needed for congestion. 1 Bottle 0   albuterol (VENTOLIN HFA) 108 (90 Base) MCG/ACT inhaler Inhale 1-2 puffs into the lungs every 6 (six) hours as needed for wheezing or shortness of breath. 6.7 g 2   atorvastatin (LIPITOR) 40 MG tablet Take 1 tablet (40 mg total) by mouth daily at 6pm. 90 tablet 3   Blood Glucose Monitoring Suppl (ACCU-CHEK GUIDE) w/Device KIT Use to check blood sugar three times daily. 1 kit 0   Blood Glucose Monitoring Suppl (ONETOUCH VERIO) w/Device KIT Use to check blood sugar three times daily. 1 kit 0   fluticasone (FLONASE) 50 MCG/ACT nasal spray Place 2 sprays into both nostrils daily. (Patient taking differently: Place 2 sprays into both nostrils daily as needed for allergies.) 16 g 1   gabapentin (NEURONTIN) 600 MG tablet Take 1 tablet (600 mg total) by mouth daily as needed (for neuropathy in hands and feet).     glucose blood (ONETOUCH VERIO) test strip Use to check blood sugar three times daily. 100 each 2   Insulin Glargine (BASAGLAR KWIKPEN) 100 UNIT/ML Inject 40 Units into the skin 2 (two) times daily. 30 mL 0   insulin lispro (HUMALOG) 100 UNIT/ML KwikPen Inject 12  Units into the skin with breakfast, with lunch, and with evening meal. 15 mL 0   lisinopril-hydrochlorothiazide (ZESTORETIC) 20-25 MG tablet Take 1 tablet by mouth daily. [place]  tablet 0   OneTouch Delica Lancets 38S MISC Use to check blood sugar three times daily. 100 each 2   sertraline (ZOLOFT) 50 MG tablet Take 1 tablet (50 mg total) by mouth daily. 30 tablet 2   TRUEplus Lancets 28G MISC 1 each by Does not apply route every 8 (eight) hours as needed. 100 each 12   doxycycline (VIBRAMYCIN) 100 MG capsule Take 100 mg by mouth 2 (two) times daily. (Patient not taking: Reported on 09/29/2022)     fluconazole (DIFLUCAN) 150 MG tablet Take 150 mg by mouth every 3 (three) days. (Patient not taking: Reported on 09/29/2022)     gabapentin (NEURONTIN) 600 MG tablet Take by mouth. (Patient not taking: Reported on 09/29/2022)     No facility-administered medications prior to visit.    Allergies  Allergen Reactions   Penicillins Hives and Shortness Of Breath    Has patient had a PCN reaction causing immediate rash, facial/tongue/throat swelling, SOB or lightheadedness with hypotension: Yes Has patient had a PCN reaction causing severe rash involving mucus membranes or skin necrosis: No Has patient had a PCN reaction that required hospitalization No Has patient had a PCN reaction occurring within the last 10 years: No If all of the above answers are "NO", then may proceed with Cephalosporin use.   Azithromycin Hives   Erythromycin Base Hives   Sulfa Antibiotics Hives   Tramadol Hives       Objective:    BP 115/70   Pulse 98   Ht '5\' 4"'$  (1.626 m)   Wt 190 lb 9.6 oz (86.5 kg)   SpO2 98%   BMI 32.72 kg/m  Wt Readings from Last 3 Encounters:  09/29/22 190 lb 9.6 oz (86.5 kg)  05/22/22 174 lb (78.9 kg)  01/25/22 173 lb (78.5 kg)    Physical Exam Vitals and nursing note reviewed.  Constitutional:      Appearance: She is well-developed.  HENT:     Head: Normocephalic and atraumatic.   Cardiovascular:     Rate and Rhythm: Normal rate and regular rhythm.     Heart sounds: Normal heart sounds. No murmur heard.    No friction rub. No gallop.  Pulmonary:     Effort: Pulmonary effort is normal. No tachypnea or respiratory distress.     Breath sounds: Normal breath sounds. No decreased breath sounds, wheezing, rhonchi or rales.  Chest:     Chest wall: No tenderness.  Abdominal:     General: Bowel sounds are normal.     Palpations: Abdomen is soft.  Musculoskeletal:        General: Normal range of motion.     Cervical back: Normal range of motion.  Skin:    General: Skin is warm and dry.     Findings: Rash present.          Comments: Dry patchy macular rash  Neurological:     Mental Status: She is alert and oriented to person, place, and time.     Coordination: Coordination normal.  Psychiatric:        Behavior: Behavior normal. Behavior is cooperative.        Thought Content: Thought content normal.        Judgment: Judgment normal.          Patient has been counseled extensively about nutrition and exercise as well as the importance of adherence with medications and regular follow-up. The patient was given clear instructions to go to ER or return to medical center  if symptoms don't improve, worsen or new problems develop. The patient verbalized understanding.   Follow-up: Return in about 3 months (around 12/28/2022).   Gildardo Pounds, FNP-BC [facility] and [place], [place]   09/29/2022, 1:39 PM

## 2022-09-29 NOTE — Addendum Note (Signed)
Addended by: Gasper Lloyd on: 09/29/2022 02:31 PM   Modules accepted: Orders

## 2022-09-30 LAB — LIPID PANEL
Chol/HDL Ratio: 3.8 ratio (ref 0.0–4.4)
Cholesterol, Total: 130 mg/dL (ref 100–199)
HDL: 34 mg/dL — ABNORMAL LOW (ref >39)
LDL Chol Calc (NIH): 55 mg/dL (ref 0–99)
Triglycerides: 258 mg/dL — ABNORMAL HIGH (ref 0–149)
VLDL Cholesterol Cal: 41 mg/dL — ABNORMAL HIGH (ref 5–40)

## 2022-09-30 LAB — URINALYSIS, COMPLETE
Ketones, UA: NEGATIVE
Nitrite, UA: POSITIVE — AB
Specific Gravity, UA: 1.02 (ref 1.005–1.030)
Urobilinogen, Ur: 1 mg/dL (ref 0.2–1.0)
pH, UA: 5.5 (ref 5.0–7.5)

## 2022-09-30 LAB — HIV ANTIBODY (ROUTINE TESTING W REFLEX): HIV Screen 4th Generation wRfx: NONREACTIVE

## 2022-09-30 LAB — CMP14+EGFR
ALT: 15 IU/L (ref 0–32)
AST: 17 IU/L (ref 0–40)
Albumin/Globulin Ratio: 1.4 (ref 1.2–2.2)
Albumin: 4.3 g/dL (ref 3.8–4.9)
Alkaline Phosphatase: 118 IU/L (ref 44–121)
BUN/Creatinine Ratio: 19 (ref 9–23)
BUN: 34 mg/dL — ABNORMAL HIGH (ref 6–24)
Bilirubin Total: 0.2 mg/dL (ref 0.0–1.2)
CO2: 19 mmol/L — ABNORMAL LOW (ref 20–29)
Calcium: 10.4 mg/dL — ABNORMAL HIGH (ref 8.7–10.2)
Chloride: 106 mmol/L (ref 96–106)
Creatinine, Ser: 1.82 mg/dL — ABNORMAL HIGH (ref 0.57–1.00)
Globulin, Total: 3.1 g/dL (ref 1.5–4.5)
Glucose: 168 mg/dL — ABNORMAL HIGH (ref 70–99)
Potassium: 4.1 mmol/L (ref 3.5–5.2)
Sodium: 142 mmol/L (ref 134–144)
Total Protein: 7.4 g/dL (ref 6.0–8.5)
eGFR: 32 mL/min/{1.73_m2} — ABNORMAL LOW (ref >59)

## 2022-09-30 LAB — HEMOGLOBIN A1C
Est. average glucose Bld gHb Est-mCnc: 252 mg/dL
Hgb A1c MFr Bld: 10.4 % — ABNORMAL HIGH (ref 4.8–5.6)

## 2022-09-30 LAB — MICROSCOPIC EXAMINATION
Bacteria, UA: NONE SEEN
Casts: NONE SEEN /lpf
WBC, UA: >30 /hpf — AB (ref 0–5)

## 2022-09-30 LAB — PROLACTIN: Prolactin: 17 ng/mL (ref 3.6–25.2)

## 2022-10-02 ENCOUNTER — Other Ambulatory Visit: Payer: Self-pay

## 2022-10-02 LAB — CERVICOVAGINAL ANCILLARY ONLY
Bacterial Vaginitis (gardnerella): NEGATIVE
Candida Glabrata: POSITIVE — AB
Candida Vaginitis: NEGATIVE
Chlamydia: NEGATIVE
Comment: NEGATIVE
Comment: NEGATIVE
Comment: NEGATIVE
Comment: NEGATIVE
Comment: NEGATIVE
Comment: NORMAL
Neisseria Gonorrhea: NEGATIVE
Trichomonas: NEGATIVE

## 2022-10-02 LAB — URINE CULTURE

## 2022-10-08 ENCOUNTER — Other Ambulatory Visit: Payer: Self-pay | Admitting: Nurse Practitioner

## 2022-10-08 DIAGNOSIS — B3731 Acute candidiasis of vulva and vagina: Secondary | ICD-10-CM

## 2022-10-08 MED ORDER — FLUCONAZOLE 150 MG PO TABS
150.0000 mg | ORAL_TABLET | Freq: Once | ORAL | 0 refills | Status: AC
  Filled 2022-10-08 – 2022-10-09 (×2): qty 1, 1d supply, fill #0

## 2022-10-09 ENCOUNTER — Other Ambulatory Visit: Payer: Self-pay

## 2022-10-09 ENCOUNTER — Encounter: Payer: Self-pay | Admitting: Registered"

## 2022-10-09 ENCOUNTER — Encounter: Payer: BLUE CROSS/BLUE SHIELD | Attending: Family Medicine | Admitting: Registered"

## 2022-10-09 DIAGNOSIS — E118 Type 2 diabetes mellitus with unspecified complications: Secondary | ICD-10-CM | POA: Diagnosis present

## 2022-10-09 DIAGNOSIS — E1165 Type 2 diabetes mellitus with hyperglycemia: Secondary | ICD-10-CM | POA: Diagnosis present

## 2022-10-09 NOTE — Patient Instructions (Addendum)
Contact your doctors office to make sure you using the right insulin doses  Continuous Glucose Monitor:  Freestyle 3 (14 days) OR Dexcom G7 (10 day wear) Check your phone to see if compatible   Find social support https://al-anon.org/  Consider going to a podiatrist  Find a dentist DetailSports.is  National Domestic Violence Hotline: [place] [address] [place], [place] [address] Crisis Line:  808-061-1874 Crisis Text:  340-246-2677   For food resources: Resources to help with food assistance:  [place] (asimplegesturegso.org)  [place] and [employer] were created in 2015. They may have been updated in 2018, but if you use these resources you may want to call ahead to make sure the information is still correct. Pantries: little blue book.. do a search and look for wordpress link. [place] computers block access because it is a blog site. Meals: the-little-green-book-11-27-2015.pdf (asimplegesturegso.org)  Mobile: Out of the [place] website updates their calendar monthly with times and locations  Some Farmers Markets accept EBT and sometimes you can find some that will double your benefits.  [place] market is one that accepts EBT: [employer], [place] ([place].org)
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
## 2022-10-10 ENCOUNTER — Ambulatory Visit: Payer: Self-pay | Admitting: Nurse Practitioner

## 2022-10-27 ENCOUNTER — Inpatient Hospital Stay: Admission: RE | Admit: 2022-10-27 | Payer: Medicaid Other | Source: Ambulatory Visit

## 2022-11-01 ENCOUNTER — Encounter: Payer: Self-pay | Admitting: Nurse Practitioner

## 2022-11-02 ENCOUNTER — Encounter: Payer: Self-pay | Admitting: Nurse Practitioner

## 2022-11-02 ENCOUNTER — Telehealth: Payer: Self-pay | Admitting: Emergency Medicine

## 2022-11-02 NOTE — Telephone Encounter (Signed)
Copied from [place] [id number]. Topic: General - Other >> Nov 02, 2022 11:13 AM Everette C wrote: Reason for CRM: Matalie with [facility] has called to follow up on a previously submitted document to the practice and confirm it's receipt   Please contact further when available

## 2022-11-03 ENCOUNTER — Inpatient Hospital Stay: Admission: RE | Admit: 2022-11-03 | Payer: Medicaid Other | Source: Ambulatory Visit

## 2022-11-06 ENCOUNTER — Ambulatory Visit: Payer: Medicaid Other | Admitting: Registered"

## 2022-11-06 NOTE — Telephone Encounter (Signed)
Unable to reach patient by phone to schedule a virtual for orders to be signed. Voicemail left.

## 2022-11-08 ENCOUNTER — Ambulatory Visit
Admission: RE | Admit: 2022-11-08 | Discharge: 2022-11-08 | Disposition: A | Discharge status: Home / Self Care | Payer: Medicaid Other | Source: Ambulatory Visit | Attending: Nurse Practitioner | Admitting: Nurse Practitioner

## 2022-11-08 DIAGNOSIS — Z1231 Encounter for screening mammogram for malignant neoplasm of breast: Secondary | ICD-10-CM

## 2022-11-17 ENCOUNTER — Other Ambulatory Visit: Payer: Self-pay

## 2022-12-12 ENCOUNTER — Telehealth: Payer: Self-pay | Admitting: Nurse Practitioner

## 2022-12-12 NOTE — Telephone Encounter (Signed)
Zach with [employer] is calling in to check on the status of a fax he sent over regarding the pt's medical supplies. Ian Malkin says he has sent it multiple times and hasn't gotten a response. Please advise.

## 2022-12-13 NOTE — Telephone Encounter (Signed)
Rep at [employer] has been made aware of attempts to contact [name] for a video visit for order to be signed and sent.

## 2022-12-29 ENCOUNTER — Ambulatory Visit: Payer: Commercial Managed Care - HMO | Attending: Nurse Practitioner | Admitting: Nurse Practitioner

## 2022-12-29 ENCOUNTER — Encounter: Payer: Self-pay | Admitting: Nurse Practitioner

## 2022-12-29 ENCOUNTER — Other Ambulatory Visit: Payer: Self-pay

## 2022-12-29 VITALS — BP 129/78 | HR 106 | Ht 64.0 in | Wt 205.6 lb

## 2022-12-29 DIAGNOSIS — R350 Frequency of micturition: Secondary | ICD-10-CM

## 2022-12-29 DIAGNOSIS — F17210 Nicotine dependence, cigarettes, uncomplicated: Secondary | ICD-10-CM

## 2022-12-29 DIAGNOSIS — Z23 Encounter for immunization: Secondary | ICD-10-CM

## 2022-12-29 DIAGNOSIS — F5101 Primary insomnia: Secondary | ICD-10-CM

## 2022-12-29 DIAGNOSIS — Z7985 Long-term (current) use of injectable non-insulin antidiabetic drugs: Secondary | ICD-10-CM

## 2022-12-29 DIAGNOSIS — D649 Anemia, unspecified: Secondary | ICD-10-CM

## 2022-12-29 DIAGNOSIS — E559 Vitamin D deficiency, unspecified: Secondary | ICD-10-CM | POA: Diagnosis not present

## 2022-12-29 DIAGNOSIS — E1165 Type 2 diabetes mellitus with hyperglycemia: Secondary | ICD-10-CM

## 2022-12-29 DIAGNOSIS — N3001 Acute cystitis with hematuria: Secondary | ICD-10-CM

## 2022-12-29 DIAGNOSIS — F17200 Nicotine dependence, unspecified, uncomplicated: Secondary | ICD-10-CM

## 2022-12-29 DIAGNOSIS — R195 Other fecal abnormalities: Secondary | ICD-10-CM

## 2022-12-29 DIAGNOSIS — I10 Essential (primary) hypertension: Secondary | ICD-10-CM

## 2022-12-29 DIAGNOSIS — J42 Unspecified chronic bronchitis: Secondary | ICD-10-CM | POA: Diagnosis not present

## 2022-12-29 LAB — POCT URINALYSIS DIP (CLINITEK)
Glucose, UA: 100 mg/dL — AB
Nitrite, UA: POSITIVE — AB
POC PROTEIN,UA: >=300 — AB
Spec Grav, UA: 1.025 (ref 1.010–1.025)
Urobilinogen, UA: 1 E.U./dL
pH, UA: 5 (ref 5.0–8.0)

## 2022-12-29 LAB — POCT GLYCOSYLATED HEMOGLOBIN (HGB A1C): Hemoglobin A1C: 8.9 % — AB (ref 4.0–5.6)

## 2022-12-29 MED ORDER — NITROFURANTOIN MONOHYD MACRO 100 MG PO CAPS
100.0000 mg | ORAL_CAPSULE | Freq: Two times a day (BID) | ORAL | 0 refills | Status: AC
Start: 2022-12-29 — End: 2023-01-03
  Filled 2022-12-29: qty 10, 5d supply, fill #0

## 2022-12-29 MED ORDER — LISINOPRIL-HYDROCHLOROTHIAZIDE 20-25 MG PO TABS
1.0000 | ORAL_TABLET | Freq: Every day | ORAL | 1 refills | Status: DC
Start: 2022-12-29 — End: 2023-07-25
  Filled 2022-12-29 – 2023-01-30 (×2): qty 30, 30d supply, fill #0
  Filled 2023-01-30: qty 30, 30d supply, fill #1
  Filled 2023-01-30: qty 30, 30d supply, fill #0
  Filled 2023-03-09: qty 30, 30d supply, fill #1
  Filled 2023-04-20: qty 30, 30d supply, fill #2

## 2022-12-29 MED ORDER — OZEMPIC (0.25 OR 0.5 MG/DOSE) 2 MG/3ML ~~LOC~~ SOPN
0.2500 mg | PEN_INJECTOR | SUBCUTANEOUS | 1 refills | Status: DC
  Filled 2022-12-29 – 2023-01-30 (×3): qty 3, 30d supply, fill #0
  Filled 2023-03-09: qty 3, 28d supply, fill #0
  Filled 2023-04-20: qty 3, 28d supply, fill #1

## 2022-12-29 MED ORDER — PREDNISONE 20 MG PO TABS
40.0000 mg | ORAL_TABLET | Freq: Every day | ORAL | 0 refills | Status: DC
  Filled 2022-12-29: qty 5, 2d supply, fill #0

## 2022-12-29 MED ORDER — TRAZODONE HCL 300 MG PO TABS
300.0000 mg | ORAL_TABLET | Freq: Every day | ORAL | 1 refills | Status: DC
  Filled 2022-12-29 – 2023-01-30 (×2): qty 30, 30d supply, fill #0
  Filled 2023-01-30: qty 30, 30d supply, fill #1
  Filled 2023-01-30: qty 30, 30d supply, fill #0
  Filled 2023-03-09 – 2023-04-20 (×3): qty 30, 30d supply, fill #1

## 2022-12-29 NOTE — Progress Notes (Signed)
Left hand shakes uncontrollably trouble sleeping    On going cough since feb  Incontinence supplies

## 2022-12-29 NOTE — Progress Notes (Signed)
Assessment & Plan:  [name] was seen today for diabetes and hypertension.  Diagnoses and all orders for this visit:  Uncontrolled type 2 diabetes mellitus with hyperglycemia  Not at goal. Added ozempic today. Continue lantus and humalog -     POCT glycosylated hemoglobin (Hb A1C) -     Semaglutide,0.25 or 0.5MG /DOS, (OZEMPIC, 0.25 OR 0.5 MG/DOSE,) 2 MG/3ML SOPN; Inject 0.25 mg into the skin once a week. -     CMP14+EGFR  Primary hypertension Well controlled -     lisinopril-hydrochlorothiazide (ZESTORETIC) 20-25 MG tablet; Take 1 tablet by mouth daily. -     CMP14+EGFR  Primary insomnia -     trazodone (DESYREL) 300 MG tablet; Take 1 tablet (300 mg total) by mouth at bedtime.  Loose stools -     Ambulatory referral to Gastroenterology  Chronic bronchitis, unspecified chronic bronchitis type (HCC) Encouraged to stop smoking. She is still smoking up to 1/2 ppd. -     predniSONE (DELTASONE) 20 MG tablet; Take 2 tablets (40 mg total) by mouth daily with breakfast. -     CT CHEST LUNG CA SCREEN LOW DOSE W/O CM; Future  Tobacco dependence -     CT CHEST LUNG CA SCREEN LOW DOSE W/O CM; Future  Increased urinary frequency -     POCT URINALYSIS DIP (CLINITEK)  Anemia, unspecified type -     CBC with Differential  Vitamin D deficiency disease -     VITAMIN D 25 Hydroxy (Vit-D Deficiency, Fractures)  Acute cystitis with hematuria -     nitrofurantoin, macrocrystal-monohydrate, (MACROBID) 100 MG capsule; Take 1 capsule (100 mg total) by mouth 2 (two) times daily for 5 days.  Need for shingles vaccine -     Varicella-zoster vaccine IM    Patient has been counseled on age-appropriate routine health concerns for screening and prevention. These are reviewed and up-to-date. Referrals have been placed accordingly. Immunizations are up-to-date or declined.    Subjective:   Chief Complaint  Patient presents with   Diabetes   Hypertension   Diabetes Pertinent negatives for  hypoglycemia include no dizziness, headaches or seizures. Pertinent negatives for diabetes include no blurred vision, no chest pain and no weight loss.  Hypertension Associated symptoms include shortness of breath. Pertinent negatives include no blurred vision, chest pain, headaches, malaise/fatigue or palpitations.   Barbara Wang 57 y.o. female presents to office today for follow up to HTN   Cough Patient complains of productive cough with sputum described as mucoid and tenacious and wheezing.  Symptoms began a few months ago.  The cough is productive of clear sputum, with wheezing, with shortness of breath during the cough, harsh and is aggravated by nothing Associated symptoms include: none .  Patient does have a history of asthma. Patient does have a history of environmental allergens. Patient did not have recent travel. Patient does have a history of smoking. Patient  does not have recent Chest X-ray.  She is using her albuterol inhaler as needed.    Urinary Incontinence: Patient complains of urinary incontinence. This has been present for several years. She leaks urine with with urge, with a full bladder, during the night, and with activity. Patient describes the symptoms as  urge to urinate with little or no warning, urine leakage with coughing/heavy physical activity, and urine leaking unpredictably.  Factors associated with symptoms include none known. Evaluation to date includes none.Treatment to date includes none. She has been wearing adult pull ups for the past  3-4 years. Can't use pads as urine will overflow with these.    DM A1c not at goal. Will add ozempic 0.25 mg weekly. She will continue on humalog 12 units TID and lantus 40 units BID.  Lab Results  Component Value Date   HGBA1C 8.9 (A) 12/29/2022    GI She does note chronic loose stools. Does not take any medications currently that should be causing this. She states she had a colonoscopy and endoscopy several years ago  in [place] when she was admitted to the hospital however I am unable to find record of this in her chart and she can not recall any specific dates or details of either procedure.   Review of Systems  Constitutional:  Negative for fever, malaise/fatigue and weight loss.  HENT: Negative.  Negative for nosebleeds.   Eyes: Negative.  Negative for blurred vision, double vision and photophobia.  Respiratory:  Positive for cough, sputum production, shortness of breath and wheezing. Negative for hemoptysis.   Cardiovascular: Negative.  Negative for chest pain, palpitations and leg swelling.  Gastrointestinal:  Positive for diarrhea. Negative for abdominal pain, blood in stool, constipation, heartburn, melena, nausea and vomiting.  Genitourinary:  Positive for dysuria, frequency and urgency. Negative for flank pain and hematuria.  Musculoskeletal: Negative.  Negative for myalgias.  Neurological: Negative.  Negative for dizziness, focal weakness, seizures and headaches.  Psychiatric/Behavioral:  Negative for suicidal ideas. The patient has insomnia.     Past Medical History:  Diagnosis Date   Asthma    Brain tumor (HCC)    Cellulitis 06/2015   rt hand    Depression    History of hiatal hernia    "it went away on it's own"   Hyperlipemia    Hypertension    Neuropathy     "feet & hands "   Obesity    Pancreatitis    Type II diabetes mellitus (HCC)    insulin dependent     Past Surgical History:  Procedure Laterality Date   ABDOMINAL HYSTERECTOMY     BREAST CYST EXCISION Right 10+ yrs ago   abscess removed   CESAREAN SECTION  1981; 1987   CHOLECYSTECTOMY  05/11/2012   Procedure: LAPAROSCOPIC CHOLECYSTECTOMY WITH INTRAOPERATIVE CHOLANGIOGRAM;  Surgeon: Atilano Ina, MD,FACS;  Location: [facility] OR;  Service: General;  Laterality: N/A;   SHOULDER ARTHROSCOPY W/ ROTATOR CUFF REPAIR Right    TUBAL LIGATION  10/25/1999   Hattie Perch 01/11/2011    Family History  Problem Relation Age of Onset   Stroke  Mother    Breast cancer Mother 61   Diabetes Father    Diabetes Sister    Breast cancer Maternal Aunt    Breast cancer Maternal Aunt    Breast cancer Maternal Grandmother     Social History Reviewed with no changes to be made today.   Outpatient Medications Prior to Visit  Medication Sig Dispense Refill   Accu-Chek Softclix Lancets lancets Use as instructed. Check blood glucose by fingerstick twice per day. 200 each 12   acetaminophen (TYLENOL) 500 MG tablet Take 1,000 mg by mouth every 6 (six) hours as needed for moderate pain.     aspirin 81 MG EC tablet Take 1 tablet (81 mg total) by mouth daily. 90 tablet 3   atorvastatin (LIPITOR) 40 MG tablet Take 1 tablet (40 mg total) by mouth daily at 6pm. 90 tablet 3   Blood Glucose Monitoring Suppl (ACCU-CHEK GUIDE) w/Device KIT Use to check blood sugar three times daily. 1  kit 0   chlorhexidine (PERIDEX) 0.12 % solution Use as directed 15 mLs in the mouth or throat 2 (two) times daily. 1000 mL 3   gabapentin (NEURONTIN) 600 MG tablet Take 1 tablet (600 mg total) by mouth daily as needed (for neuropathy in hands and feet). 90 tablet 1   insulin glargine (LANTUS SOLOSTAR) 100 UNIT/ML Solostar Pen Inject 40 Units into the skin 2 (two) times daily. 45 mL 3   insulin lispro (HUMALOG KWIKPEN) 100 UNIT/ML KwikPen Inject 12 Units into the skin 3 (three) times daily. 15 mL 11   Insulin Pen Needle 32G X 4 MM MISC Use 4 (four) times daily with insulin pen -  before meals and at bedtime. 100 each 0   sertraline (ZOLOFT) 50 MG tablet Take 1 tablet (50 mg total) by mouth daily. FOR DEPRESSION 90 tablet 2   triamcinolone (KENALOG) 0.025 % ointment Apply 1 Application topically 2 (two) times daily. To both elbows 60 g 1   albuterol (PROAIR HFA) 108 (90 Base) MCG/ACT inhaler Inhale 1-2 puffs into the lungs every 6 (six) hours as needed for wheezing or shortness of breath. 18 g 1   glucose blood (ACCU-CHEK GUIDE) test strip Use as instructed. Check blood glucose  by fingerstick three times per day 200 each 12   pantoprazole (PROTONIX) 40 MG tablet Take 1 tablet (40 mg total) by mouth daily. (Patient not taking: Reported on 12/29/2022) 30 tablet 2   sodium chloride (OCEAN) 0.65 % SOLN nasal spray Place 1 spray into both nostrils every 4 (four) hours as needed for congestion. (Patient not taking: Reported on 12/29/2022) 1 Bottle 0   lisinopril-hydrochlorothiazide (ZESTORETIC) 20-25 MG tablet Take 1 tablet by mouth daily. (Patient not taking: Reported on 12/29/2022) 90 tablet 1   polyethylene glycol powder (GLYCOLAX/MIRALAX) 17 GM/SCOOP powder Take 17 g by mouth 2 (two) times daily. (Patient not taking: Reported on 12/29/2022) 238 g 0   senna-docusate (SENOKOT-S) 8.6-50 MG tablet Take 1 tablet by mouth at bedtime. (Patient not taking: Reported on 12/29/2022) 30 tablet 0   No facility-administered medications prior to visit.    Allergies  Allergen Reactions   Penicillins Hives and Shortness Of Breath    Has patient had a PCN reaction causing immediate rash, facial/tongue/throat swelling, SOB or lightheadedness with hypotension: Yes Has patient had a PCN reaction causing severe rash involving mucus membranes or skin necrosis: No Has patient had a PCN reaction that required hospitalization No Has patient had a PCN reaction occurring within the last 10 years: No If all of the above answers are "NO", then may proceed with Cephalosporin use.   Azithromycin Hives   Erythromycin Base Hives   Sulfa Antibiotics Hives   Tramadol Hives       Objective:    BP 129/78 (BP Location: Left Arm, Patient Position: Sitting, Cuff Size: Normal)   Pulse (!) 106   Ht 5\' 4"  (1.626 m)   Wt 205 lb 9.6 oz (93.3 kg)   SpO2 96%   BMI 35.29 kg/m  Wt Readings from Last 3 Encounters:  12/29/22 205 lb 9.6 oz (93.3 kg)  09/29/22 190 lb 9.6 oz (86.5 kg)  05/22/22 174 lb (78.9 kg)    Physical Exam Vitals and nursing note reviewed.  Constitutional:      Appearance: She is  well-developed.  HENT:     Head: Normocephalic and atraumatic.  Cardiovascular:     Rate and Rhythm: Normal rate and regular rhythm.     Heart sounds:  Normal heart sounds. No murmur heard.    No friction rub. No gallop.  Pulmonary:     Effort: Pulmonary effort is normal. No tachypnea or respiratory distress.     Breath sounds: Wheezing present. No decreased breath sounds, rhonchi or rales.  Chest:     Chest wall: No tenderness.  Abdominal:     General: Bowel sounds are normal.     Palpations: Abdomen is soft.  Musculoskeletal:        General: Normal range of motion.     Cervical back: Normal range of motion.  Skin:    General: Skin is warm and dry.  Neurological:     Mental Status: She is alert and oriented to person, place, and time.     Coordination: Coordination normal.  Psychiatric:        Behavior: Behavior normal. Behavior is cooperative.        Thought Content: Thought content normal.        Judgment: Judgment normal.          Patient has been counseled extensively about nutrition and exercise as well as the importance of adherence with medications and regular follow-up. The patient was given clear instructions to go to ER or return to medical center if symptoms don't improve, worsen or new problems develop. The patient verbalized understanding.   Follow-up: Return in about 3 months (around 03/31/2023).   Claiborne Rigg, FNP-BC [facility] [place], [place] [phone]   12/29/2022, 1:24 PM

## 2022-12-30 LAB — CBC WITH DIFFERENTIAL/PLATELET
Basophils Absolute: 0.1 10*3/uL (ref 0.0–0.2)
Basos: 1 %
EOS (ABSOLUTE): 0.3 10*3/uL (ref 0.0–0.4)
Eos: 4 %
Hematocrit: 38.3 % (ref 34.0–46.6)
Hemoglobin: 12.5 g/dL (ref 11.1–15.9)
Immature Grans (Abs): 0 10*3/uL (ref 0.0–0.1)
Immature Granulocytes: 0 %
Lymphocytes Absolute: 2.6 10*3/uL (ref 0.7–3.1)
Lymphs: 33 %
MCH: 28.5 pg (ref 26.6–33.0)
MCHC: 32.6 g/dL (ref 31.5–35.7)
MCV: 87 fL (ref 79–97)
Monocytes Absolute: 0.7 10*3/uL (ref 0.1–0.9)
Monocytes: 9 %
Neutrophils Absolute: 4.2 10*3/uL (ref 1.4–7.0)
Neutrophils: 53 %
Platelets: 401 10*3/uL (ref 150–450)
RBC: 4.38 x10E6/uL (ref 3.77–5.28)
RDW: 12.2 % (ref 11.7–15.4)
WBC: 7.8 10*3/uL (ref 3.4–10.8)

## 2022-12-30 LAB — CMP14+EGFR
ALT: 34 IU/L — ABNORMAL HIGH (ref 0–32)
AST: 29 IU/L (ref 0–40)
Albumin/Globulin Ratio: 1.5 (ref 1.2–2.2)
Albumin: 4.2 g/dL (ref 3.8–4.9)
Alkaline Phosphatase: 110 IU/L (ref 44–121)
BUN/Creatinine Ratio: 22 (ref 9–23)
BUN: 34 mg/dL — ABNORMAL HIGH (ref 6–24)
Bilirubin Total: 0.2 mg/dL (ref 0.0–1.2)
CO2: 25 mmol/L (ref 20–29)
Calcium: 10.4 mg/dL — ABNORMAL HIGH (ref 8.7–10.2)
Chloride: 105 mmol/L (ref 96–106)
Creatinine, Ser: 1.56 mg/dL — ABNORMAL HIGH (ref 0.57–1.00)
Globulin, Total: 2.8 g/dL (ref 1.5–4.5)
Glucose: 114 mg/dL — ABNORMAL HIGH (ref 70–99)
Potassium: 4.4 mmol/L (ref 3.5–5.2)
Sodium: 145 mmol/L — ABNORMAL HIGH (ref 134–144)
Total Protein: 7 g/dL (ref 6.0–8.5)
eGFR: 38 mL/min/{1.73_m2} — ABNORMAL LOW (ref >59)

## 2022-12-30 LAB — VITAMIN D 25 HYDROXY (VIT D DEFICIENCY, FRACTURES): Vit D, 25-Hydroxy: 69.5 ng/mL (ref 30.0–100.0)

## 2023-01-01 NOTE — Progress Notes (Signed)
Scheduled

## 2023-01-02 ENCOUNTER — Other Ambulatory Visit: Payer: Self-pay | Admitting: Nurse Practitioner

## 2023-01-02 DIAGNOSIS — N1832 Chronic kidney disease, stage 3b: Secondary | ICD-10-CM

## 2023-01-03 ENCOUNTER — Other Ambulatory Visit: Payer: Self-pay

## 2023-01-04 ENCOUNTER — Other Ambulatory Visit: Payer: Self-pay

## 2023-01-08 ENCOUNTER — Other Ambulatory Visit: Payer: Self-pay

## 2023-01-14 DIAGNOSIS — W010XXA Fall on same level from slipping, tripping and stumbling without subsequent striking against object, initial encounter: Secondary | ICD-10-CM | POA: Diagnosis not present

## 2023-01-14 DIAGNOSIS — S92354A Nondisplaced fracture of fifth metatarsal bone, right foot, initial encounter for closed fracture: Secondary | ICD-10-CM | POA: Diagnosis not present

## 2023-01-17 DIAGNOSIS — S92354A Nondisplaced fracture of fifth metatarsal bone, right foot, initial encounter for closed fracture: Secondary | ICD-10-CM | POA: Diagnosis not present

## 2023-01-17 DIAGNOSIS — S92354D Nondisplaced fracture of fifth metatarsal bone, right foot, subsequent encounter for fracture with routine healing: Secondary | ICD-10-CM | POA: Diagnosis not present

## 2023-01-19 DIAGNOSIS — R32 Unspecified urinary incontinence: Secondary | ICD-10-CM | POA: Diagnosis not present

## 2023-01-19 DIAGNOSIS — E119 Type 2 diabetes mellitus without complications: Secondary | ICD-10-CM | POA: Diagnosis not present

## 2023-01-29 ENCOUNTER — Encounter: Payer: Self-pay | Admitting: Nurse Practitioner

## 2023-01-30 ENCOUNTER — Other Ambulatory Visit: Payer: Self-pay

## 2023-02-01 ENCOUNTER — Inpatient Hospital Stay: Admission: RE | Admit: 2023-02-01 | Payer: Commercial Managed Care - HMO | Source: Ambulatory Visit

## 2023-02-02 ENCOUNTER — Encounter: Payer: Self-pay | Admitting: *Deleted

## 2023-02-02 ENCOUNTER — Other Ambulatory Visit: Payer: Self-pay

## 2023-02-02 NOTE — Progress Notes (Signed)
[facility] Quality Team Note  Name: Barbara Wang Date of Birth: 05/27/1966 MRN: [id number] Date: 02/02/2023  [facility] Quality Team has reviewed this patient's chart, please see recommendations below:  [facility] Quality Other; Pt is on gap for diabetic retinopathy screening.  Tried to call pt to see if she would like to attend eye event at the practice on 04/20/23.  The call would not go through to numbers listed on file.  Would you mind mentioning eye event to pt at her ov on 04/03/23 please?

## 2023-02-04 ENCOUNTER — Other Ambulatory Visit: Payer: Self-pay | Admitting: Nurse Practitioner

## 2023-02-05 ENCOUNTER — Other Ambulatory Visit: Payer: Self-pay

## 2023-02-21 ENCOUNTER — Ambulatory Visit: Payer: Medicaid Other | Admitting: Internal Medicine

## 2023-02-21 NOTE — Progress Notes (Deleted)
Name: Barbara Wang  MRN/ DOB: [id number], 1966/07/30   Age/ Sex: 56 y.o., female    PCP: Claiborne Rigg, NP   Reason for Endocrinology Evaluation: Type 2 Diabetes Mellitus     Date of Initial Endocrinology Visit: 02/21/2023     PATIENT IDENTIFIER: Barbara Wang is a 57 y.o. female with a past medical history of DM, HTN, and asthma. The patient presented for initial endocrinology clinic visit on 02/21/2023 for consultative assistance with her diabetes management.    HPI: Ms. Bradt was    Diagnosed with DM *** Prior Medications tried/Intolerance: *** Currently checking blood sugars *** x / day,  before breakfast and ***.  Hypoglycemia episodes : ***               Symptoms: ***                 Frequency: ***/  Hemoglobin A1c has ranged from 8.9% in 2024, peaking at > 15.5% in 2023.  In terms of diet, the patient ***   HOME DIABETES REGIMEN: Ozempic 0.5 mg weekly Lantus Humalog   Statin: Yes ACE-I/ARB: Yes    METER DOWNLOAD SUMMARY: Date range evaluated: *** Fingerstick Blood Glucose Tests = *** Average Number Tests/Day = *** Overall Mean FS Glucose = *** Standard Deviation = ***  BG Ranges: Low = *** High = ***   Hypoglycemic Events/30 Days: BG < 50 = *** Episodes of symptomatic severe hypoglycemia = ***   DIABETIC COMPLICATIONS: Microvascular complications:  Neuropathy, CKD III Denies: *** Last eye exam: Completed   Macrovascular complications:   Denies: CAD, PVD, CVA   PAST HISTORY: Past Medical History:  Past Medical History:  Diagnosis Date   Asthma    Brain tumor (HCC)    Cellulitis 06/2015   rt hand    Depression    History of hiatal hernia    "it went away on it's own"   Hyperlipemia    Hypertension    Neuropathy     "feet & hands "   Obesity    Pancreatitis    Type II diabetes mellitus (HCC)    insulin dependent    Past Surgical History:  Past Surgical History:  Procedure Laterality Date   ABDOMINAL  HYSTERECTOMY     BREAST CYST EXCISION Right 10+ yrs ago   abscess removed   CESAREAN SECTION  1981; 1987   CHOLECYSTECTOMY  05/11/2012   Procedure: LAPAROSCOPIC CHOLECYSTECTOMY WITH INTRAOPERATIVE CHOLANGIOGRAM;  Surgeon: Atilano Ina, MD,FACS;  Location: [facility] OR;  Service: General;  Laterality: N/A;   SHOULDER ARTHROSCOPY W/ ROTATOR CUFF REPAIR Right    TUBAL LIGATION  10/25/1999   Hattie Perch 01/11/2011    Social History:  reports that she has been smoking cigarettes. She has a 40.00 pack-year smoking history. She has never used smokeless tobacco. She reports that she does not drink alcohol and does not use drugs. Family History:  Family History  Problem Relation Age of Onset   Stroke Mother    Breast cancer Mother 64   Diabetes Father    Diabetes Sister    Breast cancer Maternal Aunt    Breast cancer Maternal Aunt    Breast cancer Maternal Grandmother      HOME MEDICATIONS: Allergies as of 02/21/2023       Reactions   Penicillins Hives, Shortness Of Breath   Has patient had a PCN reaction causing immediate rash, facial/tongue/throat swelling, SOB or lightheadedness with hypotension: Yes Has patient had a PCN  reaction causing severe rash involving mucus membranes or skin necrosis: No Has patient had a PCN reaction that required hospitalization No Has patient had a PCN reaction occurring within the last 10 years: No If all of the above answers are "NO", then may proceed with Cephalosporin use.   Azithromycin Hives   Erythromycin Base Hives   Sulfa Antibiotics Hives   Tramadol Hives        Medication List        Accurate as of February 21, 2023  7:12 AM. If you have any questions, ask your nurse or doctor.          Accu-Chek Guide test strip Generic drug: glucose blood Use as instructed. Check blood glucose by fingerstick three times per day   Accu-Chek Guide w/Device Kit Use to check blood sugar three times daily.   Accu-Chek Softclix Lancets lancets Use as instructed.  Check blood glucose by fingerstick twice per day.   acetaminophen 500 MG tablet Commonly known as: TYLENOL Take 1,000 mg by mouth every 6 (six) hours as needed for moderate pain.   aspirin EC 81 MG tablet Take 1 tablet (81 mg total) by mouth daily.   atorvastatin 40 MG tablet Commonly known as: LIPITOR Take 1 tablet (40 mg total) by mouth daily at 6pm.   chlorhexidine 0.12 % solution Commonly known as: Peridex Use as directed 15 mLs in the mouth or throat 2 (two) times daily.   gabapentin 600 MG tablet Commonly known as: Neurontin Take 1 tablet (600 mg total) by mouth daily as needed (for neuropathy in hands and feet).   HumaLOG KwikPen 100 UNIT/ML KwikPen Generic drug: insulin lispro Inject 12 Units into the skin 3 (three) times daily.   Insulin Pen Needle 32G X 4 MM Misc Use 4 (four) times daily with insulin pen -  before meals and at bedtime.   Lantus SoloStar 100 UNIT/ML Solostar Pen Generic drug: insulin glargine Inject 40 Units into the skin 2 (two) times daily.   lisinopril-hydrochlorothiazide 20-25 MG tablet Commonly known as: ZESTORETIC Take 1 tablet by mouth daily.   Ozempic (0.25 or 0.5 MG/DOSE) 2 MG/3ML Sopn Generic drug: Semaglutide(0.25 or 0.5MG /DOS) Inject 0.25 mg into the skin once a week.   pantoprazole 40 MG tablet Commonly known as: PROTONIX Take 1 tablet (40 mg total) by mouth daily.   predniSONE 20 MG tablet Commonly known as: DELTASONE Take 2 tablets (40 mg total) by mouth daily with breakfast.   sertraline 50 MG tablet Commonly known as: ZOLOFT Take 1 tablet (50 mg total) by mouth daily. FOR DEPRESSION   sodium chloride 0.65 % Soln nasal spray Commonly known as: OCEAN Place 1 spray into both nostrils every 4 (four) hours as needed for congestion.   trazodone 300 MG tablet Commonly known as: DESYREL Take 1 tablet (300 mg total) by mouth at bedtime.   triamcinolone 0.025 % ointment Commonly known as: KENALOG Apply 1 Application  topically 2 (two) times daily. To both elbows   Ventolin HFA 108 (90 Base) MCG/ACT inhaler Generic drug: albuterol Inhale 1-2 puffs into the lungs every 6 (six) hours as needed for wheezing or shortness of breath.         ALLERGIES: Allergies  Allergen Reactions   Penicillins Hives and Shortness Of Breath    Has patient had a PCN reaction causing immediate rash, facial/tongue/throat swelling, SOB or lightheadedness with hypotension: Yes Has patient had a PCN reaction causing severe rash involving mucus membranes or skin necrosis: No Has  patient had a PCN reaction that required hospitalization No Has patient had a PCN reaction occurring within the last 10 years: No If all of the above answers are "NO", then may proceed with Cephalosporin use.   Azithromycin Hives   Erythromycin Base Hives   Sulfa Antibiotics Hives   Tramadol Hives     REVIEW OF SYSTEMS: A comprehensive ROS was conducted with the patient and is negative except as per HPI    OBJECTIVE:   VITAL SIGNS: There were no vitals taken for this visit.   PHYSICAL EXAM:  General: Pt appears well and is in NAD  Neck: General: Supple without adenopathy or carotid bruits. Thyroid: Thyroid size normal.  No goiter or nodules appreciated.   Lungs: Clear with good BS bilat   Heart: RRR   Abdomen:  soft, nontender  Extremities:  Lower extremities - No pretibial edema.   Neuro: MS is good with appropriate affect, pt is alert and Ox3    DM foot exam:    DATA REVIEWED:  Lab Results  Component Value Date   HGBA1C 8.9 (A) 12/29/2022   HGBA1C 10.4 (H) 09/29/2022   HGBA1C >15.5 (H) 05/15/2022     Latest Reference Range & Units 12/29/22 12:28  Sodium 134 - 144 mmol/L 145 (H)  Potassium 3.5 - 5.2 mmol/L 4.4  Chloride 96 - 106 mmol/L 105  CO2 20 - 29 mmol/L 25  Glucose 70 - 99 mg/dL 161 (H)  BUN 6 - 24 mg/dL 34 (H)  Creatinine 0.96 - 1.00 mg/dL 0.45 (H)  Calcium 8.7 - 10.2 mg/dL 40.9 (H)  BUN/Creatinine Ratio 9 -  23  22  eGFR >59 mL/min/1.73 38 (L)  Alkaline Phosphatase 44 - 121 IU/L 110  Albumin 3.8 - 4.9 g/dL 4.2  Albumin/Globulin Ratio 1.2 - 2.2  1.5  AST 0 - 40 IU/L 29  ALT 0 - 32 IU/L 34 (H)  Total Protein 6.0 - 8.5 g/dL 7.0  Total Bilirubin 0.0 - 1.2 mg/dL 0.2  (H): Data is abnormally high (L): Data is abnormally low ASSESSMENT / PLAN / RECOMMENDATIONS:   1) Type 2 Diabetes Mellitus, ***controlled, With neuropathic and CKD III complications - Most recent A1c of *** %. Goal A1c < 7.0 %.    Plan: GENERAL: ***  MEDICATIONS: ***  EDUCATION / INSTRUCTIONS: BG monitoring instructions: Patient is instructed to check her blood sugars *** times a day, ***. Call [facility] if: BG persistently < 70  I reviewed the Rule of 15 for the treatment of hypoglycemia in detail with the patient. Literature supplied.   2) Diabetic complications:  Eye: Does *** have known diabetic retinopathy.  Neuro/ Feet: Does *** have known diabetic peripheral neuropathy. Renal: Patient does *** have known baseline CKD. She is *** on an ACEI/ARB at present.  3) Lipids: Patient is *** on a statin.    4) Hypertension: ***  at goal of < 140/90 mmHg.       Signed electronically by: Lyndle Herrlich, MD  [facility]  [facility] [address], [address] [place], [place] [address] Phone: [phone] FAX: [phone]   CC: Claiborne Rigg, NP [address] [place] [address] Phone: [phone]  Fax: [phone]    Return to Endocrinology clinic as below: Future Appointments  Date Time Provider Department Center  02/21/2023  9:30 AM Demaria Deeney, Konrad Dolores, MD [facility] None  04/03/2023  9:50 AM Claiborne Rigg, NP [facility] None

## 2023-03-09 ENCOUNTER — Other Ambulatory Visit: Payer: Self-pay

## 2023-03-12 ENCOUNTER — Other Ambulatory Visit: Payer: Self-pay

## 2023-03-14 ENCOUNTER — Encounter: Payer: Self-pay | Admitting: Nurse Practitioner

## 2023-03-19 ENCOUNTER — Other Ambulatory Visit: Payer: Self-pay

## 2023-03-22 DIAGNOSIS — M79671 Pain in right foot: Secondary | ICD-10-CM | POA: Diagnosis not present

## 2023-03-22 DIAGNOSIS — S93401S Sprain of unspecified ligament of right ankle, sequela: Secondary | ICD-10-CM | POA: Diagnosis not present

## 2023-03-22 DIAGNOSIS — S92354D Nondisplaced fracture of fifth metatarsal bone, right foot, subsequent encounter for fracture with routine healing: Secondary | ICD-10-CM | POA: Diagnosis not present

## 2023-03-22 DIAGNOSIS — Z8639 Personal history of other endocrine, nutritional and metabolic disease: Secondary | ICD-10-CM | POA: Diagnosis not present

## 2023-04-03 ENCOUNTER — Ambulatory Visit: Payer: Commercial Managed Care - HMO | Admitting: Nurse Practitioner

## 2023-04-20 ENCOUNTER — Other Ambulatory Visit: Payer: Self-pay

## 2023-04-20 ENCOUNTER — Other Ambulatory Visit (HOSPITAL_BASED_OUTPATIENT_CLINIC_OR_DEPARTMENT_OTHER): Payer: Self-pay

## 2023-04-20 ENCOUNTER — Other Ambulatory Visit: Payer: Self-pay | Admitting: Family

## 2023-04-20 ENCOUNTER — Other Ambulatory Visit (HOSPITAL_COMMUNITY): Payer: Self-pay

## 2023-04-20 DIAGNOSIS — K219 Gastro-esophageal reflux disease without esophagitis: Secondary | ICD-10-CM

## 2023-04-20 MED ORDER — PANTOPRAZOLE SODIUM 40 MG PO TBEC
40.0000 mg | DELAYED_RELEASE_TABLET | Freq: Every day | ORAL | 2 refills | Status: DC
Start: 2023-04-20 — End: 2023-09-24
  Filled 2023-04-20 – 2023-07-25 (×2): qty 30, 30d supply, fill #0

## 2023-04-24 ENCOUNTER — Other Ambulatory Visit: Payer: Self-pay

## 2023-04-26 ENCOUNTER — Other Ambulatory Visit: Payer: Self-pay

## 2023-04-27 ENCOUNTER — Other Ambulatory Visit: Payer: Self-pay

## 2023-05-01 ENCOUNTER — Other Ambulatory Visit: Payer: Self-pay

## 2023-05-08 ENCOUNTER — Other Ambulatory Visit: Payer: Self-pay

## 2023-06-01 ENCOUNTER — Other Ambulatory Visit: Payer: Self-pay | Admitting: Nurse Practitioner

## 2023-06-01 DIAGNOSIS — Z1212 Encounter for screening for malignant neoplasm of rectum: Secondary | ICD-10-CM

## 2023-06-01 DIAGNOSIS — Z1211 Encounter for screening for malignant neoplasm of colon: Secondary | ICD-10-CM

## 2023-07-10 ENCOUNTER — Telehealth: Payer: Self-pay

## 2023-07-10 NOTE — Telephone Encounter (Signed)
Copied from CRM [id number]. Topic: General - Other >> Jul 10, 2023  9:34 AM Dondra Prader A wrote: Reason for CRM: Burna Mortimer with [employer] [facility] states that they provide incontinence supplies to the pt and have reached out to her seven times to get her incontinence supplies to her. Burna Mortimer is wanting to know if it is a way the pt PCP could get in touch with the pt to let her know [employer] is needing to get in touch with her to send out her incontinence supplies.

## 2023-07-12 NOTE — Telephone Encounter (Signed)
Unable to reach patient by phone. Unable to leave voicemail.

## 2023-07-13 NOTE — Telephone Encounter (Signed)
Please sent letter and mychart message. Thanks!

## 2023-07-16 NOTE — Telephone Encounter (Signed)
Letter sent to patient and message sent through mychart.

## 2023-07-25 ENCOUNTER — Ambulatory Visit: Payer: BLUE CROSS/BLUE SHIELD | Admitting: Nurse Practitioner

## 2023-07-25 ENCOUNTER — Other Ambulatory Visit: Payer: Self-pay | Admitting: Nurse Practitioner

## 2023-07-25 ENCOUNTER — Other Ambulatory Visit: Payer: Self-pay

## 2023-07-25 DIAGNOSIS — E1142 Type 2 diabetes mellitus with diabetic polyneuropathy: Secondary | ICD-10-CM

## 2023-07-25 DIAGNOSIS — I10 Essential (primary) hypertension: Secondary | ICD-10-CM

## 2023-07-25 MED ORDER — LANTUS SOLOSTAR 100 UNIT/ML ~~LOC~~ SOPN
40.0000 [IU] | PEN_INJECTOR | Freq: Two times a day (BID) | SUBCUTANEOUS | 0 refills | Status: DC
  Filled 2023-07-25: qty 15, 19d supply, fill #0

## 2023-07-25 MED ORDER — LISINOPRIL-HYDROCHLOROTHIAZIDE 20-25 MG PO TABS
1.0000 | ORAL_TABLET | Freq: Every day | ORAL | 0 refills | Status: DC
Start: 2023-07-25 — End: 2023-07-31
  Filled 2023-07-25: qty 15, 15d supply, fill #0

## 2023-07-31 ENCOUNTER — Other Ambulatory Visit: Payer: Self-pay

## 2023-07-31 ENCOUNTER — Ambulatory Visit: Payer: Medicaid Other | Attending: Nurse Practitioner | Admitting: Nurse Practitioner

## 2023-07-31 ENCOUNTER — Encounter: Payer: Self-pay | Admitting: Nurse Practitioner

## 2023-07-31 VITALS — BP 159/84 | HR 95 | Ht 64.0 in | Wt 190.0 lb

## 2023-07-31 DIAGNOSIS — R82998 Other abnormal findings in urine: Secondary | ICD-10-CM

## 2023-07-31 DIAGNOSIS — E119 Type 2 diabetes mellitus without complications: Secondary | ICD-10-CM

## 2023-07-31 DIAGNOSIS — J418 Mixed simple and mucopurulent chronic bronchitis: Secondary | ICD-10-CM

## 2023-07-31 DIAGNOSIS — K1379 Other lesions of oral mucosa: Secondary | ICD-10-CM

## 2023-07-31 DIAGNOSIS — Z794 Long term (current) use of insulin: Secondary | ICD-10-CM

## 2023-07-31 DIAGNOSIS — F331 Major depressive disorder, recurrent, moderate: Secondary | ICD-10-CM

## 2023-07-31 DIAGNOSIS — L2089 Other atopic dermatitis: Secondary | ICD-10-CM | POA: Diagnosis not present

## 2023-07-31 DIAGNOSIS — I10 Essential (primary) hypertension: Secondary | ICD-10-CM | POA: Diagnosis not present

## 2023-07-31 DIAGNOSIS — E785 Hyperlipidemia, unspecified: Secondary | ICD-10-CM

## 2023-07-31 LAB — POCT GLYCOSYLATED HEMOGLOBIN (HGB A1C): Hemoglobin A1C: 9.5 % — AB (ref 4.0–5.6)

## 2023-07-31 MED ORDER — BUDESONIDE-FORMOTEROL FUMARATE 160-4.5 MCG/ACT IN AERO
2.0000 | INHALATION_SPRAY | Freq: Two times a day (BID) | RESPIRATORY_TRACT | 6 refills | Status: AC
  Filled 2023-07-31 – 2023-12-04 (×2): qty 10.2, 30d supply, fill #0
  Filled 2024-01-14: qty 10.2, 30d supply, fill #1
  Filled 2024-02-10: qty 30.6, 90d supply, fill #2

## 2023-07-31 MED ORDER — ATORVASTATIN CALCIUM 40 MG PO TABS
ORAL_TABLET | ORAL | 3 refills | Status: DC
  Filled 2023-07-31 (×2): qty 30, 30d supply, fill #0
  Filled 2023-09-10 (×2): qty 30, 30d supply, fill #1

## 2023-07-31 MED ORDER — SERTRALINE HCL 50 MG PO TABS
50.0000 mg | ORAL_TABLET | Freq: Every day | ORAL | 2 refills | Status: DC
  Filled 2023-07-31: qty 90, 90d supply, fill #0

## 2023-07-31 MED ORDER — INSULIN LISPRO (1 UNIT DIAL) 100 UNIT/ML (KWIKPEN)
12.0000 [IU] | PEN_INJECTOR | Freq: Three times a day (TID) | SUBCUTANEOUS | 11 refills | Status: DC
  Filled 2023-07-31 – 2023-08-08 (×3): qty 15, 42d supply, fill #0

## 2023-07-31 MED ORDER — ALBUTEROL SULFATE HFA 108 (90 BASE) MCG/ACT IN AERS
1.0000 | INHALATION_SPRAY | Freq: Four times a day (QID) | RESPIRATORY_TRACT | 1 refills | Status: DC | PRN
  Filled 2023-07-31 – 2023-12-04 (×4): qty 18, 25d supply, fill #0

## 2023-07-31 MED ORDER — ALBUTEROL SULFATE (2.5 MG/3ML) 0.083% IN NEBU
2.5000 mg | INHALATION_SOLUTION | Freq: Four times a day (QID) | RESPIRATORY_TRACT | 1 refills | Status: DC | PRN
  Filled 2023-07-31: qty 150, 13d supply, fill #0

## 2023-07-31 MED ORDER — LISINOPRIL-HYDROCHLOROTHIAZIDE 20-25 MG PO TABS
1.0000 | ORAL_TABLET | Freq: Every day | ORAL | 1 refills | Status: DC
  Filled 2023-07-31 – 2023-09-10 (×3): qty 90, 90d supply, fill #0

## 2023-07-31 MED ORDER — MOMETASONE FUROATE 0.1 % EX CREA
TOPICAL_CREAM | Freq: Every day | CUTANEOUS | 1 refills | Status: DC
  Filled 2023-07-31: qty 60, 25d supply, fill #0
  Filled 2023-08-07: qty 60, 30d supply, fill #0
  Filled 2024-01-14: qty 60, 30d supply, fill #1

## 2023-07-31 MED ORDER — FLUTICASONE-SALMETEROL 230-21 MCG/ACT IN AERO
2.0000 | INHALATION_SPRAY | Freq: Two times a day (BID) | RESPIRATORY_TRACT | 12 refills | Status: DC
  Filled 2023-07-31 – 2024-07-25 (×5): qty 12, 30d supply, fill #0

## 2023-07-31 MED ORDER — OZEMPIC (0.25 OR 0.5 MG/DOSE) 2 MG/3ML ~~LOC~~ SOPN
0.5000 mg | PEN_INJECTOR | SUBCUTANEOUS | 1 refills | Status: DC
  Filled 2023-07-31: qty 3, 28d supply, fill #0

## 2023-07-31 MED ORDER — LANTUS SOLOSTAR 100 UNIT/ML ~~LOC~~ SOPN
40.0000 [IU] | PEN_INJECTOR | Freq: Two times a day (BID) | SUBCUTANEOUS | 6 refills | Status: DC
  Filled 2023-07-31 – 2023-08-08 (×4): qty 45, 56d supply, fill #0

## 2023-07-31 NOTE — Patient Instructions (Signed)
[facility] Non emergency medical transportation [employer] [phone]

## 2023-07-31 NOTE — Progress Notes (Signed)
Assessment & Plan:  Barbara Wang was seen today for medical management of chronic issues.  Diagnoses and all orders for this visit:  Primary hypertension -     CMP14+EGFR -     lisinopril-hydrochlorothiazide (ZESTORETIC) 20-25 MG tablet; Take 1 tablet by mouth daily. Continue all antihypertensives as prescribed.  Reminded to bring in blood pressure log for follow  up appointment.  RECOMMENDATIONS: DASH/Mediterranean Diets are healthier choices for HTN.    Dyslipidemia, goal LDL below 70 -     Lipid panel INSTRUCTIONS: Work on a low fat, heart healthy diet and participate in regular aerobic exercise program by working out at least 150 minutes per week; 5 days a week-30 minutes per day. Avoid red meat/beef/steak,  fried foods. junk foods, sodas, sugary drinks, unhealthy snacking, alcohol and smoking.  Drink at least 80 oz of water per day and monitor your carbohydrate intake daily.    Type 2 diabetes mellitus with insulin therapy (HCC) -     Microalbumin / creatinine urine ratio -     POCT glycosylated hemoglobin (Hb A1C) -     Semaglutide,0.25 or 0.5MG /DOS, (OZEMPIC, 0.25 OR 0.5 MG/DOSE,) 2 MG/3ML SOPN; Inject 0.5 mg into the skin once a week. -     insulin glargine (LANTUS SOLOSTAR) 100 UNIT/ML Solostar Pen; Inject 40 Units into the skin 2 (two) times daily. -     atorvastatin (LIPITOR) 40 MG tablet; Take 1 tablet (40 mg total) by mouth daily at 6pm. -     insulin lispro (HUMALOG KWIKPEN) 100 UNIT/ML KwikPen; Inject 12 Units into the skin 3 (three) times daily. -     Ambulatory referral to Ophthalmology Continue blood sugar control as discussed in office today, low carbohydrate diet, and regular physical exercise as tolerated, 150 minutes per week (30 min each day, 5 days per week, or 50 min 3 days per week). Keep blood sugar logs with fasting goal of 90-130 mg/dl, post prandial (after you eat) less than 180.  For Hypoglycemia: BS <60 and Hyperglycemia BS >400; contact the clinic  ASAP. Annual eye exams and foot exams are recommended.   Flexural atopic dermatitis -     mometasone (ELOCON) 0.1 % cream; Apply topically daily.  Dark urine -     Urinalysis, Complete -     Urinalysis, Complete  Mixed simple and mucopurulent chronic bronchitis (HCC) -     albuterol (PROAIR HFA) 108 (90 Base) MCG/ACT inhaler; Inhale 1-2 puffs into the lungs every 6 (six) hours as needed for wheezing or shortness of breath. -     budesonide-formoterol (SYMBICORT) 160-4.5 MCG/ACT inhaler; Inhale 2 puffs into the lungs 2 (two) times daily. -     fluticasone-salmeterol (ADVAIR HFA) 230-21 MCG/ACT inhaler; Inhale 2 puffs into the lungs 2 (two) times daily. -     For home use only DME Nebulizer machine -     albuterol (PROVENTIL) (2.5 MG/3ML) 0.083% nebulizer solution; Take 3 mLs (2.5 mg total) by nebulization every 6 (six) hours as needed for wheezing or shortness of breath.  Moderate episode of recurrent major depressive disorder (HCC) -     sertraline (ZOLOFT) 50 MG tablet; Take 1 tablet (50 mg total) by mouth daily. FOR DEPRESSION  Other lesions of oral mucosa -     Ambulatory referral to Dentistry    Patient has been counseled on age-appropriate routine health concerns for screening and prevention. These are reviewed and up-to-date. Referrals have been placed accordingly. Immunizations are up-to-date or declined.  Subjective:   Chief Complaint  Patient presents with   Medical Management of Chronic Issues    Barbara Wang 57 y.o. female presents to office today for follow up to HTN and DM  She has a past medical history of Asthma, Brain tumor, Cellulitis (06/2015), Depression, History of hiatal hernia, Hyperlipemia, Hypertension, Neuropathy, Obesity, Pancreatitis, and Type II diabetes mellitus    HTN  Blood pressure is not at goal. She has been out of her blood pressure medications over the past few days. Currently prescribed Zestoretic 20-25 mg daily BP Readings from  Last 3 Encounters:  07/31/23 (!) 159/84  12/29/22 129/78  09/29/22 115/70    DM 2 Poorly controlled. She is not dietary adherent. Confirms medication adherence although she recently ran out of her insulin. Currently prescribed ozempic, lantus and humalog. Lab Results  Component Value Date   HGBA1C 9.5 (A) 07/31/2023     Lab Results  Component Value Date   HGBA1C 8.9 (A) 12/29/2022    COPD: Patient complains of cough, wheezing, and increased sputum. Symptoms are chronic. Sputum is  thick  in moderate amounts. Fever has been  absent . Patient currently is not on home oxygen therapy.Marland Kitchen Respiratory history: asthma, occasional episodes of bronchitis, and COPD    She has been using kenalog 0.025% on her elbows. She still feels like her elbows are rough in texture.   Needs to see dentist. Has several lesion on the right lower gumline which has not been responsive to peridex.    Review of Systems  Constitutional:  Negative for fever, malaise/fatigue and weight loss.  HENT: Negative.  Negative for nosebleeds.   Eyes: Negative.  Negative for blurred vision, double vision and photophobia.  Respiratory:  Positive for cough, sputum production and shortness of breath.   Cardiovascular: Negative.  Negative for chest pain, palpitations and leg swelling.  Gastrointestinal: Negative.  Negative for heartburn, nausea and vomiting.  Genitourinary:  Negative for dysuria, flank pain, frequency, hematuria and urgency.       Dark cloudy urine  Musculoskeletal: Negative.  Negative for myalgias.  Skin:  Positive for itching and rash.  Neurological: Negative.  Negative for dizziness, focal weakness, seizures and headaches.  Psychiatric/Behavioral: Negative.  Negative for suicidal ideas.     Past Medical History:  Diagnosis Date   Asthma    Brain tumor (HCC)    Cellulitis 06/2015   rt hand    Depression    History of hiatal hernia    "it went away on it's own"   Hyperlipemia    Hypertension     Neuropathy     "feet & hands "   Obesity    Pancreatitis    Type II diabetes mellitus (HCC)    insulin dependent     Past Surgical History:  Procedure Laterality Date   ABDOMINAL HYSTERECTOMY     BREAST CYST EXCISION Right 10+ yrs ago   abscess removed   CESAREAN SECTION  1981; 1987   CHOLECYSTECTOMY  05/11/2012   Procedure: LAPAROSCOPIC CHOLECYSTECTOMY WITH INTRAOPERATIVE CHOLANGIOGRAM;  Surgeon: Atilano Ina, MD,FACS;  Location: [facility] OR;  Service: General;  Laterality: N/A;   SHOULDER ARTHROSCOPY W/ ROTATOR CUFF REPAIR Right    TUBAL LIGATION  10/25/1999   Hattie Perch 01/11/2011    Family History  Problem Relation Age of Onset   Stroke Mother    Breast cancer Mother 77   Diabetes Father    Diabetes Sister    Breast cancer Maternal Aunt  Breast cancer Maternal Aunt    Breast cancer Maternal Grandmother     Social History Reviewed with no changes to be made today.   Outpatient Medications Prior to Visit  Medication Sig Dispense Refill   acetaminophen (TYLENOL) 500 MG tablet Take 1,000 mg by mouth every 6 (six) hours as needed for moderate pain.     aspirin 81 MG EC tablet Take 1 tablet (81 mg total) by mouth daily. 90 tablet 3   chlorhexidine (PERIDEX) 0.12 % solution Use as directed 15 mLs in the mouth or throat 2 (two) times daily. 1000 mL 3   gabapentin (NEURONTIN) 600 MG tablet Take 1 tablet (600 mg total) by mouth daily as needed (for neuropathy in hands and feet). 90 tablet 1   Insulin Pen Needle 32G X 4 MM MISC Use 4 (four) times daily with insulin pen -  before meals and at bedtime. 100 each 0   pantoprazole (PROTONIX) 40 MG tablet Take 1 tablet (40 mg total) by mouth daily. 30 tablet 2   trazodone (DESYREL) 300 MG tablet Take 1 tablet (300 mg total) by mouth at bedtime. 90 tablet 1   albuterol (PROAIR HFA) 108 (90 Base) MCG/ACT inhaler Inhale 1-2 puffs into the lungs every 6 (six) hours as needed for wheezing or shortness of breath. 18 g 1   atorvastatin (LIPITOR) 40 MG  tablet Take 1 tablet (40 mg total) by mouth daily at 6pm. 90 tablet 3   lisinopril-hydrochlorothiazide (ZESTORETIC) 20-25 MG tablet Take 1 tablet by mouth daily. 15 tablet 0   sertraline (ZOLOFT) 50 MG tablet Take 1 tablet (50 mg total) by mouth daily. FOR DEPRESSION 90 tablet 2   triamcinolone (KENALOG) 0.025 % ointment Apply 1 Application topically 2 (two) times daily. To both elbows 60 g 1   Accu-Chek Softclix Lancets lancets Use as instructed. Check blood glucose by fingerstick twice per day. (Patient not taking: Reported on 07/31/2023) 200 each 12   Blood Glucose Monitoring Suppl (ACCU-CHEK GUIDE) w/Device KIT Use to check blood sugar three times daily. (Patient not taking: Reported on 07/31/2023) 1 kit 0   glucose blood (ACCU-CHEK GUIDE) test strip Use as instructed. Check blood glucose by fingerstick three times per day (Patient not taking: Reported on 07/31/2023) 200 each 12   sodium chloride (OCEAN) 0.65 % SOLN nasal spray Place 1 spray into both nostrils every 4 (four) hours as needed for congestion. (Patient not taking: Reported on 12/29/2022) 1 Bottle 0   insulin glargine (LANTUS SOLOSTAR) 100 UNIT/ML Solostar Pen Inject 40 Units into the skin 2 (two) times daily. 15 mL 0   insulin lispro (HUMALOG KWIKPEN) 100 UNIT/ML KwikPen Inject 12 Units into the skin 3 (three) times daily. 15 mL 11   predniSONE (DELTASONE) 20 MG tablet Take 2 tablets (40 mg total) by mouth daily with breakfast. (Patient not taking: Reported on 07/31/2023) 5 tablet 0   Semaglutide,0.25 or 0.5MG /DOS, (OZEMPIC, 0.25 OR 0.5 MG/DOSE,) 2 MG/3ML SOPN Inject 0.25 mg into the skin once a week. (Patient not taking: Reported on 07/31/2023) 3 mL 1   No facility-administered medications prior to visit.    Allergies  Allergen Reactions   Penicillins Hives and Shortness Of Breath    Has patient had a PCN reaction causing immediate rash, facial/tongue/throat swelling, SOB or lightheadedness with hypotension: Yes Has patient had a PCN  reaction causing severe rash involving mucus membranes or skin necrosis: No Has patient had a PCN reaction that required hospitalization No Has patient had a  PCN reaction occurring within the last 10 years: No If all of the above answers are "NO", then may proceed with Cephalosporin use.   Azithromycin Hives   Erythromycin Base Hives   Sulfa Antibiotics Hives   Tramadol Hives       Objective:    BP (!) 159/84 (BP Location: Left Arm, Patient Position: Sitting, Cuff Size: Normal)   Pulse 95   Ht 5\' 4"  (1.626 m)   Wt 190 lb (86.2 kg)   SpO2 97%   BMI 32.61 kg/m  Wt Readings from Last 3 Encounters:  07/31/23 190 lb (86.2 kg)  12/29/22 205 lb 9.6 oz (93.3 kg)  09/29/22 190 lb 9.6 oz (86.5 kg)    Physical Exam Vitals and nursing note reviewed.  Constitutional:      Appearance: She is well-developed.  HENT:     Head: Normocephalic and atraumatic.  Cardiovascular:     Rate and Rhythm: Normal rate and regular rhythm.     Heart sounds: Normal heart sounds. No murmur heard.    No friction rub. No gallop.  Pulmonary:     Effort: Pulmonary effort is normal. No tachypnea or respiratory distress.     Breath sounds: Normal breath sounds. No decreased breath sounds, wheezing, rhonchi or rales.  Chest:     Chest wall: No tenderness.  Abdominal:     General: Bowel sounds are normal.     Palpations: Abdomen is soft.  Musculoskeletal:        General: Normal range of motion.     Cervical back: Normal range of motion.  Skin:    General: Skin is warm and dry.  Neurological:     Mental Status: She is alert and oriented to person, place, and time.     Coordination: Coordination normal.  Psychiatric:        Behavior: Behavior normal. Behavior is cooperative.        Thought Content: Thought content normal.        Judgment: Judgment normal.          Patient has been counseled extensively about nutrition and exercise as well as the importance of adherence with medications and regular  follow-up. The patient was given clear instructions to go to ER or return to medical center if symptoms don't improve, worsen or new problems develop. The patient verbalized understanding.   Follow-up: Return in about 3 months (around 10/29/2023).   Claiborne Rigg, FNP-BC [facility] [place], [place] [phone]   07/31/2023, 5:53 PM

## 2023-08-01 ENCOUNTER — Other Ambulatory Visit: Payer: Self-pay

## 2023-08-01 LAB — MICROSCOPIC EXAMINATION
Casts: NONE SEEN /[LPF]
RBC, Urine: NONE SEEN /[HPF] (ref 0–2)

## 2023-08-01 LAB — URINALYSIS, COMPLETE
Bilirubin, UA: NEGATIVE
Ketones, UA: NEGATIVE
Leukocytes,UA: NEGATIVE
Nitrite, UA: NEGATIVE
RBC, UA: NEGATIVE
Specific Gravity, UA: <=1.005 — AB (ref 1.005–1.030)
Urobilinogen, Ur: 0.2 mg/dL (ref 0.2–1.0)
pH, UA: 5.5 (ref 5.0–7.5)

## 2023-08-02 ENCOUNTER — Telehealth: Payer: Medicaid Other | Admitting: *Deleted

## 2023-08-02 LAB — LIPID PANEL
Chol/HDL Ratio: 2.6 {ratio} (ref 0.0–4.4)
Cholesterol, Total: 119 mg/dL (ref 100–199)
HDL: 46 mg/dL (ref >39)
LDL Chol Calc (NIH): 54 mg/dL (ref 0–99)
Triglycerides: 101 mg/dL (ref 0–149)
VLDL Cholesterol Cal: 19 mg/dL (ref 5–40)

## 2023-08-02 LAB — CMP14+EGFR
ALT: 17 [IU]/L (ref 0–32)
AST: 21 [IU]/L (ref 0–40)
Albumin: 4.2 g/dL (ref 3.8–4.9)
Alkaline Phosphatase: 99 [IU]/L (ref 44–121)
BUN/Creatinine Ratio: 10 (ref 9–23)
BUN: 11 mg/dL (ref 6–24)
Bilirubin Total: 0.5 mg/dL (ref 0.0–1.2)
CO2: 27 mmol/L (ref 20–29)
Calcium: 9.3 mg/dL (ref 8.7–10.2)
Chloride: 102 mmol/L (ref 96–106)
Creatinine, Ser: 1.12 mg/dL — ABNORMAL HIGH (ref 0.57–1.00)
Globulin, Total: 2.5 g/dL (ref 1.5–4.5)
Glucose: 126 mg/dL — ABNORMAL HIGH (ref 70–99)
Potassium: 3.4 mmol/L — ABNORMAL LOW (ref 3.5–5.2)
Sodium: 144 mmol/L (ref 134–144)
Total Protein: 6.7 g/dL (ref 6.0–8.5)
eGFR: 57 mL/min/{1.73_m2} — ABNORMAL LOW (ref >59)

## 2023-08-02 LAB — MICROALBUMIN / CREATININE URINE RATIO
Creatinine, Urine: 80.7 mg/dL
Microalb/Creat Ratio: 129 mg/g{creat} — ABNORMAL HIGH (ref 0–29)
Microalbumin, Urine: 103.7 ug/mL

## 2023-08-02 NOTE — Telephone Encounter (Signed)
Call placed to patient unable to reach , unable to leave message on VM. VM full.

## 2023-08-02 NOTE — Telephone Encounter (Signed)
Patient called for her results and notified: Negative for UTI.   Reviewed medications and patient will check with pharmacy for her insulin- she did not get it 07/31/23. Patient states she needs lancet Rx for her meter- Verio.  Patient states she has financial difficultly and gets her medications as she can afford. I asked patient if she has ever let her provider know this- she is not sure- advised patient I would send message to see if there is assistance available to her.

## 2023-08-07 ENCOUNTER — Other Ambulatory Visit: Payer: Self-pay

## 2023-08-08 ENCOUNTER — Other Ambulatory Visit (HOSPITAL_COMMUNITY): Payer: Self-pay

## 2023-08-08 ENCOUNTER — Other Ambulatory Visit: Payer: Self-pay

## 2023-08-13 ENCOUNTER — Other Ambulatory Visit: Payer: Self-pay

## 2023-08-22 ENCOUNTER — Other Ambulatory Visit: Payer: Self-pay

## 2023-08-22 ENCOUNTER — Inpatient Hospital Stay (HOSPITAL_COMMUNITY)
Admission: EM | Admit: 2023-08-22 | Discharge: 2023-08-26 | DRG: 637 | Disposition: A | Discharge status: Home Health | Payer: Commercial Managed Care - HMO | Attending: Internal Medicine | Admitting: Internal Medicine

## 2023-08-22 ENCOUNTER — Encounter (HOSPITAL_COMMUNITY): Payer: Self-pay | Admitting: Emergency Medicine

## 2023-08-22 DIAGNOSIS — J410 Simple chronic bronchitis: Secondary | ICD-10-CM | POA: Diagnosis present

## 2023-08-22 DIAGNOSIS — R739 Hyperglycemia, unspecified: Principal | ICD-10-CM

## 2023-08-22 DIAGNOSIS — Z794 Long term (current) use of insulin: Secondary | ICD-10-CM

## 2023-08-22 DIAGNOSIS — Z853 Personal history of malignant neoplasm of breast: Secondary | ICD-10-CM

## 2023-08-22 DIAGNOSIS — Z7951 Long term (current) use of inhaled steroids: Secondary | ICD-10-CM

## 2023-08-22 DIAGNOSIS — E1110 Type 2 diabetes mellitus with ketoacidosis without coma: Principal | ICD-10-CM | POA: Diagnosis present

## 2023-08-22 DIAGNOSIS — E782 Mixed hyperlipidemia: Secondary | ICD-10-CM | POA: Diagnosis present

## 2023-08-22 DIAGNOSIS — Y92009 Unspecified place in unspecified non-institutional (private) residence as the place of occurrence of the external cause: Secondary | ICD-10-CM

## 2023-08-22 DIAGNOSIS — Z885 Allergy status to narcotic agent status: Secondary | ICD-10-CM

## 2023-08-22 DIAGNOSIS — E119 Type 2 diabetes mellitus without complications: Secondary | ICD-10-CM

## 2023-08-22 DIAGNOSIS — Z90710 Acquired absence of both cervix and uterus: Secondary | ICD-10-CM

## 2023-08-22 DIAGNOSIS — F32A Depression, unspecified: Secondary | ICD-10-CM | POA: Diagnosis present

## 2023-08-22 DIAGNOSIS — Z880 Allergy status to penicillin: Secondary | ICD-10-CM

## 2023-08-22 DIAGNOSIS — E876 Hypokalemia: Secondary | ICD-10-CM | POA: Diagnosis present

## 2023-08-22 DIAGNOSIS — K219 Gastro-esophageal reflux disease without esophagitis: Secondary | ICD-10-CM | POA: Diagnosis present

## 2023-08-22 DIAGNOSIS — Z91119 Patient's noncompliance with dietary regimen due to unspecified reason: Secondary | ICD-10-CM

## 2023-08-22 DIAGNOSIS — Z8673 Personal history of transient ischemic attack (TIA), and cerebral infarction without residual deficits: Secondary | ICD-10-CM

## 2023-08-22 DIAGNOSIS — D649 Anemia, unspecified: Secondary | ICD-10-CM | POA: Diagnosis present

## 2023-08-22 DIAGNOSIS — Z79899 Other long term (current) drug therapy: Secondary | ICD-10-CM

## 2023-08-22 DIAGNOSIS — N390 Urinary tract infection, site not specified: Secondary | ICD-10-CM | POA: Diagnosis present

## 2023-08-22 DIAGNOSIS — Z9049 Acquired absence of other specified parts of digestive tract: Secondary | ICD-10-CM

## 2023-08-22 DIAGNOSIS — I10 Essential (primary) hypertension: Secondary | ICD-10-CM | POA: Diagnosis present

## 2023-08-22 DIAGNOSIS — Z833 Family history of diabetes mellitus: Secondary | ICD-10-CM

## 2023-08-22 DIAGNOSIS — Z881 Allergy status to other antibiotic agents status: Secondary | ICD-10-CM

## 2023-08-22 DIAGNOSIS — R9431 Abnormal electrocardiogram [ECG] [EKG]: Secondary | ICD-10-CM | POA: Diagnosis present

## 2023-08-22 DIAGNOSIS — Z6833 Body mass index (BMI) 33.0-33.9, adult: Secondary | ICD-10-CM

## 2023-08-22 DIAGNOSIS — E860 Dehydration: Secondary | ICD-10-CM | POA: Diagnosis present

## 2023-08-22 DIAGNOSIS — Z7985 Long-term (current) use of injectable non-insulin antidiabetic drugs: Secondary | ICD-10-CM

## 2023-08-22 DIAGNOSIS — Z823 Family history of stroke: Secondary | ICD-10-CM

## 2023-08-22 DIAGNOSIS — Z5986 Financial insecurity: Secondary | ICD-10-CM

## 2023-08-22 DIAGNOSIS — E669 Obesity, unspecified: Secondary | ICD-10-CM | POA: Diagnosis present

## 2023-08-22 DIAGNOSIS — J45909 Unspecified asthma, uncomplicated: Secondary | ICD-10-CM | POA: Diagnosis present

## 2023-08-22 DIAGNOSIS — Z7982 Long term (current) use of aspirin: Secondary | ICD-10-CM

## 2023-08-22 DIAGNOSIS — D75839 Thrombocytosis, unspecified: Secondary | ICD-10-CM | POA: Diagnosis present

## 2023-08-22 DIAGNOSIS — T383X6A Underdosing of insulin and oral hypoglycemic [antidiabetic] drugs, initial encounter: Secondary | ICD-10-CM | POA: Diagnosis present

## 2023-08-22 DIAGNOSIS — Z803 Family history of malignant neoplasm of breast: Secondary | ICD-10-CM

## 2023-08-22 DIAGNOSIS — Z91128 Patient's intentional underdosing of medication regimen for other reason: Secondary | ICD-10-CM

## 2023-08-22 DIAGNOSIS — N179 Acute kidney failure, unspecified: Secondary | ICD-10-CM | POA: Diagnosis present

## 2023-08-22 DIAGNOSIS — G253 Myoclonus: Secondary | ICD-10-CM | POA: Diagnosis present

## 2023-08-22 DIAGNOSIS — G9341 Metabolic encephalopathy: Secondary | ICD-10-CM

## 2023-08-22 DIAGNOSIS — F17210 Nicotine dependence, cigarettes, uncomplicated: Secondary | ICD-10-CM | POA: Diagnosis present

## 2023-08-22 DIAGNOSIS — D320 Benign neoplasm of cerebral meninges: Secondary | ICD-10-CM | POA: Diagnosis present

## 2023-08-22 LAB — CBG MONITORING, ED: Glucose-Capillary: >600 mg/dL (ref 70–99)

## 2023-08-22 MED ORDER — SODIUM CHLORIDE 0.9 % IV BOLUS
1000.0000 mL | Freq: Once | INTRAVENOUS | Status: AC
  Administered 2023-08-23: 1000 mL via INTRAVENOUS

## 2023-08-22 NOTE — ED Triage Notes (Addendum)
Per ems, pt from home. Daughter called ems d/t pt acting "off" today. CBG at home read "HI." Pt also developed a new "twitch" to right hand. EMS EKG tachycardia 111, otherwise VSS. GCS 15. Ambulatory at baseline. 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 CBG in triage read "HI." Pt lethargic, slow to respond to questioning.

## 2023-08-22 NOTE — ED Provider Notes (Signed)
[place] EMERGENCY DEPARTMENT AT [facility] Provider Note   CSN: [id number] Arrival date & time: 08/22/23  2319     History {Add pertinent medical, surgical, social history, OB history to HPI:1} Chief Complaint  Patient presents with   Hyperglycemia    Barbara Wang is a 57 y.o. female.   Hyperglycemia      Home Medications Prior to Admission medications   Medication Sig Start Date End Date Taking? Authorizing Provider  Accu-Chek Softclix Lancets lancets Use as instructed. Check blood glucose by fingerstick twice per day. Patient not taking: Reported on 07/31/2023 09/29/22   Claiborne Rigg, NP  acetaminophen (TYLENOL) 500 MG tablet Take 1,000 mg by mouth every 6 (six) hours as needed for moderate pain.    [provider]  albuterol (PROAIR HFA) 108 (90 Base) MCG/ACT inhaler Inhale 1-2 puffs into the lungs every 6 (six) hours as needed for wheezing or shortness of breath. 07/31/23   Claiborne Rigg, NP  albuterol (PROVENTIL) (2.5 MG/3ML) 0.083% nebulizer solution Take 3 mLs (2.5 mg total) by nebulization every 6 (six) hours as needed for wheezing or shortness of breath. 07/31/23   Claiborne Rigg, NP  aspirin 81 MG EC tablet Take 1 tablet (81 mg total) by mouth daily. 12/12/19   Claiborne Rigg, NP  atorvastatin (LIPITOR) 40 MG tablet Take 1 tablet (40 mg total) by mouth daily at 6pm. 07/31/23   Claiborne Rigg, NP  Blood Glucose Monitoring Suppl (ACCU-CHEK GUIDE) w/Device KIT Use to check blood sugar three times daily. Patient not taking: Reported on 07/31/2023 09/29/22   Claiborne Rigg, NP  budesonide-formoterol [facility]) 160-4.5 MCG/ACT inhaler Inhale 2 puffs into the lungs 2 (two) times daily. 07/31/23   Claiborne Rigg, NP  chlorhexidine (PERIDEX) 0.12 % solution Use as directed 15 mLs in the mouth or throat 2 (two) times daily. 09/29/22   Claiborne Rigg, NP  fluticasone-salmeterol (ADVAIR HFA) [phone] MCG/ACT inhaler Inhale 2 puffs into the  lungs 2 (two) times daily. 07/31/23   Claiborne Rigg, NP  gabapentin (NEURONTIN) 600 MG tablet Take 1 tablet (600 mg total) by mouth daily as needed (for neuropathy in hands and feet). 09/29/22   Claiborne Rigg, NP  glucose blood (ACCU-CHEK GUIDE) test strip Use as instructed. Check blood glucose by fingerstick three times per day Patient not taking: Reported on 07/31/2023 09/29/22   Claiborne Rigg, NP  insulin glargine (LANTUS SOLOSTAR) 100 UNIT/ML Solostar Pen Inject 40 Units into the skin 2 (two) times daily. 07/31/23   Claiborne Rigg, NP  insulin lispro (HUMALOG KWIKPEN) 100 UNIT/ML KwikPen Inject 12 Units into the skin 3 (three) times daily. 07/31/23   Claiborne Rigg, NP  Insulin Pen Needle 32G X 4 MM MISC Use 4 (four) times daily with insulin pen -  before meals and at bedtime. 08/29/22   Hoy Register, MD  lisinopril-hydrochlorothiazide (ZESTORETIC) 20-25 MG tablet Take 1 tablet by mouth daily. 07/31/23   Claiborne Rigg, NP  mometasone (ELOCON) 0.1 % cream Apply topically daily. 07/31/23   Claiborne Rigg, NP  pantoprazole (PROTONIX) 40 MG tablet Take 1 tablet (40 mg total) by mouth daily. 04/20/23   Claiborne Rigg, NP  Semaglutide,0.25 or 0.5MG /DOS, (OZEMPIC, 0.25 OR 0.5 MG/DOSE,) 2 MG/3ML SOPN Inject 0.5 mg into the skin once a week. 07/31/23   Claiborne Rigg, NP  sertraline (ZOLOFT) 50 MG tablet Take 1 tablet (50 mg total) by mouth daily. FOR  DEPRESSION 07/31/23   Claiborne Rigg, NP  sodium chloride (OCEAN) 0.65 % SOLN nasal spray Place 1 spray into both nostrils every 4 (four) hours as needed for congestion. Patient not taking: Reported on 12/29/2022 01/02/19   Arrien, York Ram, MD  trazodone (DESYREL) 300 MG tablet Take 1 tablet (300 mg total) by mouth at bedtime. 12/29/22   Claiborne Rigg, NP  insulin NPH Human (HUMULIN N) 100 UNIT/ML injection Inject 50 Units into the skin 2 times daily with meals for 30 days. (discard after 31 days) 09/23/21 10/06/21        Allergies     Penicillins, Azithromycin, Erythromycin base, Sulfa antibiotics, and Tramadol    Review of Systems   Review of Systems  Physical Exam Updated Vital Signs BP 108/87 (BP Location: Left Arm)   Pulse (!) 112   Temp 98.4 F (36.9 C) (Oral)   Resp (!) 22   SpO2 98%  Physical Exam  ED Results / Procedures / Treatments   Labs (all labs ordered are listed, but only abnormal results are displayed) Labs Reviewed  CBG MONITORING, ED - Abnormal; Notable for the following components:      Result Value   Glucose-Capillary >600 (*)    All other components within normal limits  CBC  URINALYSIS, ROUTINE W REFLEX MICROSCOPIC  COMPREHENSIVE METABOLIC PANEL  BETA-HYDROXYBUTYRIC ACID  I-STAT VENOUS BLOOD GAS, ED    EKG EKG Interpretation Date/Time:  Wednesday August 22 2023 23:46:37 EST Ventricular Rate:  110 PR Interval:  158 QRS Duration:  94 QT Interval:  362 QTC Calculation: 490 R Axis:   38  Text Interpretation: Sinus tachycardia Probable left atrial enlargement Borderline ST elevation, anterior leads Borderline prolonged QT interval Confirmed by Tilden Fossa [phone]) on 08/22/2023 11:56:46 PM  Radiology No results found.  Procedures Procedures  {Document cardiac monitor, telemetry assessment procedure when appropriate:1}  Medications Ordered in ED Medications  sodium chloride 0.9 % bolus 1,000 mL (has no administration in time range)    ED Course/ Medical Decision Making/ A&P   {   Click here for ABCD2, HEART and other calculatorsREFRESH Note before signing :1}                              Medical Decision Making Amount and/or Complexity of Data Reviewed Labs: ordered.   ***  {Document critical care time when appropriate:1} {Document review of labs and clinical decision tools ie heart score, Chads2Vasc2 etc:1}  {Document your independent review of radiology images, and any outside records:1} {Document your discussion with family members, caretakers, and with  consultants:1} {Document social determinants of health affecting pt's care:1} {Document your decision making why or why not admission, treatments were needed:1} Final Clinical Impression(s) / ED Diagnoses Final diagnoses:  None    Rx / DC Orders ED Discharge Orders     None

## 2023-08-23 ENCOUNTER — Emergency Department (HOSPITAL_COMMUNITY): Payer: Commercial Managed Care - HMO

## 2023-08-23 ENCOUNTER — Observation Stay (HOSPITAL_COMMUNITY): Payer: Commercial Managed Care - HMO

## 2023-08-23 DIAGNOSIS — T383X6A Underdosing of insulin and oral hypoglycemic [antidiabetic] drugs, initial encounter: Secondary | ICD-10-CM | POA: Diagnosis present

## 2023-08-23 DIAGNOSIS — E876 Hypokalemia: Secondary | ICD-10-CM | POA: Diagnosis present

## 2023-08-23 DIAGNOSIS — Z91128 Patient's intentional underdosing of medication regimen for other reason: Secondary | ICD-10-CM | POA: Diagnosis not present

## 2023-08-23 DIAGNOSIS — F17210 Nicotine dependence, cigarettes, uncomplicated: Secondary | ICD-10-CM | POA: Diagnosis present

## 2023-08-23 DIAGNOSIS — Z8673 Personal history of transient ischemic attack (TIA), and cerebral infarction without residual deficits: Secondary | ICD-10-CM | POA: Diagnosis not present

## 2023-08-23 DIAGNOSIS — J410 Simple chronic bronchitis: Secondary | ICD-10-CM | POA: Diagnosis present

## 2023-08-23 DIAGNOSIS — D320 Benign neoplasm of cerebral meninges: Secondary | ICD-10-CM | POA: Diagnosis present

## 2023-08-23 DIAGNOSIS — E782 Mixed hyperlipidemia: Secondary | ICD-10-CM | POA: Diagnosis present

## 2023-08-23 DIAGNOSIS — E1165 Type 2 diabetes mellitus with hyperglycemia: Secondary | ICD-10-CM | POA: Diagnosis not present

## 2023-08-23 DIAGNOSIS — E1110 Type 2 diabetes mellitus with ketoacidosis without coma: Secondary | ICD-10-CM | POA: Diagnosis present

## 2023-08-23 DIAGNOSIS — Z794 Long term (current) use of insulin: Secondary | ICD-10-CM | POA: Diagnosis not present

## 2023-08-23 DIAGNOSIS — E860 Dehydration: Secondary | ICD-10-CM | POA: Diagnosis present

## 2023-08-23 DIAGNOSIS — R569 Unspecified convulsions: Secondary | ICD-10-CM | POA: Diagnosis not present

## 2023-08-23 DIAGNOSIS — N179 Acute kidney failure, unspecified: Secondary | ICD-10-CM | POA: Diagnosis present

## 2023-08-23 DIAGNOSIS — N390 Urinary tract infection, site not specified: Secondary | ICD-10-CM | POA: Diagnosis present

## 2023-08-23 DIAGNOSIS — G928 Other toxic encephalopathy: Secondary | ICD-10-CM

## 2023-08-23 DIAGNOSIS — G9341 Metabolic encephalopathy: Secondary | ICD-10-CM | POA: Diagnosis present

## 2023-08-23 DIAGNOSIS — R9431 Abnormal electrocardiogram [ECG] [EKG]: Secondary | ICD-10-CM | POA: Diagnosis present

## 2023-08-23 DIAGNOSIS — F32A Depression, unspecified: Secondary | ICD-10-CM | POA: Diagnosis present

## 2023-08-23 DIAGNOSIS — I10 Essential (primary) hypertension: Secondary | ICD-10-CM | POA: Diagnosis present

## 2023-08-23 DIAGNOSIS — E669 Obesity, unspecified: Secondary | ICD-10-CM | POA: Diagnosis present

## 2023-08-23 DIAGNOSIS — Z91141 Patient's other noncompliance with medication regimen due to financial hardship: Secondary | ICD-10-CM | POA: Diagnosis not present

## 2023-08-23 DIAGNOSIS — Z91119 Patient's noncompliance with dietary regimen due to unspecified reason: Secondary | ICD-10-CM | POA: Diagnosis not present

## 2023-08-23 DIAGNOSIS — Y92009 Unspecified place in unspecified non-institutional (private) residence as the place of occurrence of the external cause: Secondary | ICD-10-CM | POA: Diagnosis not present

## 2023-08-23 DIAGNOSIS — G253 Myoclonus: Secondary | ICD-10-CM | POA: Diagnosis present

## 2023-08-23 DIAGNOSIS — D649 Anemia, unspecified: Secondary | ICD-10-CM | POA: Diagnosis present

## 2023-08-23 LAB — COMPREHENSIVE METABOLIC PANEL
ALT: 14 U/L (ref 0–44)
AST: 13 U/L — ABNORMAL LOW (ref 15–41)
Albumin: 3.9 g/dL (ref 3.5–5.0)
Alkaline Phosphatase: 105 U/L (ref 38–126)
Anion gap: 32 — ABNORMAL HIGH (ref 5–15)
BUN: 39 mg/dL — ABNORMAL HIGH (ref 6–20)
CO2: 14 mmol/L — ABNORMAL LOW (ref 22–32)
Calcium: 10.2 mg/dL (ref 8.9–10.3)
Chloride: 82 mmol/L — ABNORMAL LOW (ref 98–111)
Creatinine, Ser: 2.94 mg/dL — ABNORMAL HIGH (ref 0.44–1.00)
GFR, Estimated: 18 mL/min — ABNORMAL LOW (ref >60)
Glucose, Bld: 1008 mg/dL (ref 70–99)
Potassium: 4.2 mmol/L (ref 3.5–5.1)
Sodium: 128 mmol/L — ABNORMAL LOW (ref 135–145)
Total Bilirubin: 2.1 mg/dL — ABNORMAL HIGH (ref <1.2)
Total Protein: 8.1 g/dL (ref 6.5–8.1)

## 2023-08-23 LAB — BASIC METABOLIC PANEL
Anion gap: 36 — ABNORMAL HIGH (ref 5–15)
Anion gap: 11 (ref 5–15)
Anion gap: 10 (ref 5–15)
BUN: 39 mg/dL — ABNORMAL HIGH (ref 6–20)
BUN: 31 mg/dL — ABNORMAL HIGH (ref 6–20)
BUN: 26 mg/dL — ABNORMAL HIGH (ref 6–20)
CO2: 14 mmol/L — ABNORMAL LOW (ref 22–32)
CO2: 27 mmol/L (ref 22–32)
CO2: 27 mmol/L (ref 22–32)
Calcium: 10.2 mg/dL (ref 8.9–10.3)
Calcium: 9.7 mg/dL (ref 8.9–10.3)
Calcium: 9.1 mg/dL (ref 8.9–10.3)
Chloride: 79 mmol/L — ABNORMAL LOW (ref 98–111)
Chloride: 102 mmol/L (ref 98–111)
Chloride: 99 mmol/L (ref 98–111)
Creatinine, Ser: 2.93 mg/dL — ABNORMAL HIGH (ref 0.44–1.00)
Creatinine, Ser: 1.97 mg/dL — ABNORMAL HIGH (ref 0.44–1.00)
Creatinine, Ser: 1.92 mg/dL — ABNORMAL HIGH (ref 0.44–1.00)
GFR, Estimated: 18 mL/min — ABNORMAL LOW (ref >60)
GFR, Estimated: 29 mL/min — ABNORMAL LOW (ref >60)
GFR, Estimated: 30 mL/min — ABNORMAL LOW (ref >60)
Glucose, Bld: 968 mg/dL (ref 70–99)
Glucose, Bld: 102 mg/dL — ABNORMAL HIGH (ref 70–99)
Glucose, Bld: 240 mg/dL — ABNORMAL HIGH (ref 70–99)
Potassium: 4.5 mmol/L (ref 3.5–5.1)
Potassium: 2.5 mmol/L — CL (ref 3.5–5.1)
Potassium: 2.9 mmol/L — ABNORMAL LOW (ref 3.5–5.1)
Sodium: 129 mmol/L — ABNORMAL LOW (ref 135–145)
Sodium: 140 mmol/L (ref 135–145)
Sodium: 136 mmol/L (ref 135–145)

## 2023-08-23 LAB — CBC
HCT: 26.1 % — ABNORMAL LOW (ref 36.0–46.0)
HCT: 36 % (ref 36.0–46.0)
HCT: 42.1 % (ref 36.0–46.0)
Hemoglobin: 12.5 g/dL (ref 12.0–15.0)
Hemoglobin: 14.4 g/dL (ref 12.0–15.0)
Hemoglobin: 9.1 g/dL — ABNORMAL LOW (ref 12.0–15.0)
MCH: 27.8 pg (ref 26.0–34.0)
MCH: 27.9 pg (ref 26.0–34.0)
MCH: 28 pg (ref 26.0–34.0)
MCHC: 34.2 g/dL (ref 30.0–36.0)
MCHC: 34.9 g/dL (ref 30.0–36.0)
MCHC: 35 g/dL (ref 30.0–36.0)
MCV: 79.8 fL — ABNORMAL LOW (ref 80.0–100.0)
MCV: 79.8 fL — ABNORMAL LOW (ref 80.0–100.0)
MCV: 81.7 fL (ref 80.0–100.0)
Platelets: 331 10*3/uL (ref 150–400)
Platelets: 469 10*3/uL — ABNORMAL HIGH (ref 150–400)
Platelets: 533 10*3/uL — ABNORMAL HIGH (ref 150–400)
RBC: 3.27 MIL/uL — ABNORMAL LOW (ref 3.87–5.11)
RBC: 4.51 MIL/uL (ref 3.87–5.11)
RBC: 5.15 MIL/uL — ABNORMAL HIGH (ref 3.87–5.11)
RDW: 12.9 % (ref 11.5–15.5)
RDW: 13 % (ref 11.5–15.5)
RDW: 13 % (ref 11.5–15.5)
WBC: 5.8 10*3/uL (ref 4.0–10.5)
WBC: 9.1 10*3/uL (ref 4.0–10.5)
WBC: 9.2 10*3/uL (ref 4.0–10.5)
nRBC: 0 % (ref 0.0–0.2)
nRBC: 0 % (ref 0.0–0.2)
nRBC: 0 % (ref 0.0–0.2)

## 2023-08-23 LAB — I-STAT VENOUS BLOOD GAS, ED
Acid-base deficit: 7 mmol/L — ABNORMAL HIGH (ref 0.0–2.0)
Bicarbonate: 18.3 mmol/L — ABNORMAL LOW (ref 20.0–28.0)
Calcium, Ion: 1.14 mmol/L — ABNORMAL LOW (ref 1.15–1.40)
HCT: 44 % (ref 36.0–46.0)
Hemoglobin: 15 g/dL (ref 12.0–15.0)
O2 Saturation: 78 %
Potassium: 3.8 mmol/L (ref 3.5–5.1)
Sodium: 128 mmol/L — ABNORMAL LOW (ref 135–145)
TCO2: 19 mmol/L — ABNORMAL LOW (ref 22–32)
pCO2, Ven: 35.1 mm[Hg] — ABNORMAL LOW (ref 44–60)
pH, Ven: 7.324 (ref 7.25–7.43)
pO2, Ven: 45 mm[Hg] (ref 32–45)

## 2023-08-23 LAB — URINALYSIS, ROUTINE W REFLEX MICROSCOPIC
Bilirubin Urine: NEGATIVE
Glucose, UA: >=500 mg/dL — AB
Ketones, ur: 80 mg/dL — AB
Nitrite: NEGATIVE
Protein, ur: 30 mg/dL — AB
RBC / HPF: >50 RBC/hpf (ref 0–5)
Specific Gravity, Urine: 1.024 (ref 1.005–1.030)
WBC, UA: >50 WBC/hpf (ref 0–5)
pH: 5 (ref 5.0–8.0)

## 2023-08-23 LAB — BETA-HYDROXYBUTYRIC ACID: Beta-Hydroxybutyric Acid: >8 mmol/L — ABNORMAL HIGH (ref 0.05–0.27)

## 2023-08-23 LAB — GLUCOSE, CAPILLARY
Glucose-Capillary: >600 mg/dL (ref 70–99)
Glucose-Capillary: 551 mg/dL (ref 70–99)
Glucose-Capillary: 495 mg/dL — ABNORMAL HIGH (ref 70–99)
Glucose-Capillary: 458 mg/dL — ABNORMAL HIGH (ref 70–99)
Glucose-Capillary: 394 mg/dL — ABNORMAL HIGH (ref 70–99)
Glucose-Capillary: 436 mg/dL — ABNORMAL HIGH (ref 70–99)
Glucose-Capillary: 312 mg/dL — ABNORMAL HIGH (ref 70–99)
Glucose-Capillary: 211 mg/dL — ABNORMAL HIGH (ref 70–99)
Glucose-Capillary: 248 mg/dL — ABNORMAL HIGH (ref 70–99)
Glucose-Capillary: 217 mg/dL — ABNORMAL HIGH (ref 70–99)
Glucose-Capillary: 198 mg/dL — ABNORMAL HIGH (ref 70–99)
Glucose-Capillary: 212 mg/dL — ABNORMAL HIGH (ref 70–99)
Glucose-Capillary: 152 mg/dL — ABNORMAL HIGH (ref 70–99)
Glucose-Capillary: 110 mg/dL — ABNORMAL HIGH (ref 70–99)
Glucose-Capillary: 133 mg/dL — ABNORMAL HIGH (ref 70–99)
Glucose-Capillary: 190 mg/dL — ABNORMAL HIGH (ref 70–99)
Glucose-Capillary: 222 mg/dL — ABNORMAL HIGH (ref 70–99)
Glucose-Capillary: 251 mg/dL — ABNORMAL HIGH (ref 70–99)

## 2023-08-23 LAB — I-STAT CHEM 8, ED
BUN: 37 mg/dL — ABNORMAL HIGH (ref 6–20)
Calcium, Ion: 1.16 mmol/L (ref 1.15–1.40)
Chloride: 86 mmol/L — ABNORMAL LOW (ref 98–111)
Creatinine, Ser: 2.3 mg/dL — ABNORMAL HIGH (ref 0.44–1.00)
Glucose, Bld: >700 mg/dL (ref 70–99)
HCT: 45 % (ref 36.0–46.0)
Hemoglobin: 15.3 g/dL — ABNORMAL HIGH (ref 12.0–15.0)
Potassium: 3.8 mmol/L (ref 3.5–5.1)
Sodium: 128 mmol/L — ABNORMAL LOW (ref 135–145)
TCO2: 17 mmol/L — ABNORMAL LOW (ref 22–32)

## 2023-08-23 LAB — RAPID URINE DRUG SCREEN, HOSP PERFORMED
Amphetamines: NOT DETECTED
Barbiturates: NOT DETECTED
Benzodiazepines: NOT DETECTED
Cocaine: NOT DETECTED
Opiates: NOT DETECTED
Tetrahydrocannabinol: NOT DETECTED

## 2023-08-23 LAB — I-STAT CG4 LACTIC ACID, ED
Lactic Acid, Venous: 1.9 mmol/L (ref 0.5–1.9)
Lactic Acid, Venous: 2.7 mmol/L (ref 0.5–1.9)

## 2023-08-23 LAB — CBG MONITORING, ED: Glucose-Capillary: >600 mg/dL (ref 70–99)

## 2023-08-23 LAB — PHOSPHORUS: Phosphorus: <1 mg/dL — CL (ref 2.5–4.6)

## 2023-08-23 LAB — MAGNESIUM: Magnesium: 1 mg/dL — ABNORMAL LOW (ref 1.7–2.4)

## 2023-08-23 LAB — OSMOLALITY: Osmolality: 366 mosm/kg (ref 275–295)

## 2023-08-23 MED ORDER — POTASSIUM CHLORIDE 10 MEQ/100ML IV SOLN
10.0000 meq | INTRAVENOUS | Status: AC
Start: 2023-08-23 — End: 2023-08-23
  Administered 2023-08-23: 10 meq via INTRAVENOUS
  Filled 2023-08-23 (×2): qty 100

## 2023-08-23 MED ORDER — INSULIN GLARGINE-YFGN 100 UNIT/ML ~~LOC~~ SOLN
15.0000 [IU] | Freq: Every day | SUBCUTANEOUS | Status: DC
  Administered 2023-08-23 – 2023-08-24 (×2): 15 [IU] via SUBCUTANEOUS
  Filled 2023-08-23 (×2): qty 0.15

## 2023-08-23 MED ORDER — MAGNESIUM SULFATE 4 GM/100ML IV SOLN
4.0000 g | Freq: Once | INTRAVENOUS | Status: AC
  Administered 2023-08-23: 4 g via INTRAVENOUS
  Filled 2023-08-23: qty 100

## 2023-08-23 MED ORDER — DEXTROSE IN LACTATED RINGERS 5 % IV SOLN: INTRAVENOUS | Status: AC

## 2023-08-23 MED ORDER — SODIUM CHLORIDE 0.45 % IV SOLN: INTRAVENOUS | Status: AC

## 2023-08-23 MED ORDER — SODIUM CHLORIDE 0.9 % IV BOLUS
1000.0000 mL | Freq: Once | INTRAVENOUS | Status: AC
  Administered 2023-08-23: 1000 mL via INTRAVENOUS

## 2023-08-23 MED ORDER — LACTATED RINGERS IV SOLN: INTRAVENOUS | Status: DC

## 2023-08-23 MED ORDER — DEXTROSE 50 % IV SOLN: 0.0000 mL | INTRAVENOUS | Status: DC | PRN

## 2023-08-23 MED ORDER — INSULIN REGULAR(HUMAN) IN NACL 100-0.9 UT/100ML-% IV SOLN
INTRAVENOUS | Status: AC
  Administered 2023-08-23: 11.5 [IU]/h via INTRAVENOUS
  Filled 2023-08-23 (×3): qty 100

## 2023-08-23 MED ORDER — POTASSIUM CHLORIDE CRYS ER 20 MEQ PO TBCR
40.0000 meq | EXTENDED_RELEASE_TABLET | ORAL | Status: AC
  Administered 2023-08-23 (×2): 40 meq via ORAL
  Filled 2023-08-23 (×2): qty 2

## 2023-08-23 MED ORDER — POTASSIUM CHLORIDE 10 MEQ/100ML IV SOLN
10.0000 meq | Freq: Once | INTRAVENOUS | Status: DC
  Filled 2023-08-23: qty 100

## 2023-08-23 MED ORDER — ACETAMINOPHEN 325 MG PO TABS
650.0000 mg | ORAL_TABLET | Freq: Four times a day (QID) | ORAL | Status: DC | PRN
  Administered 2023-08-24 (×2): 650 mg via ORAL
  Filled 2023-08-23 (×2): qty 2

## 2023-08-23 MED ORDER — INSULIN ASPART 100 UNIT/ML IJ SOLN
0.0000 [IU] | Freq: Three times a day (TID) | INTRAMUSCULAR | Status: DC
Start: 2023-08-24 — End: 2023-08-26
  Administered 2023-08-24 (×2): 15 [IU] via SUBCUTANEOUS
  Administered 2023-08-24: 8 [IU] via SUBCUTANEOUS
  Administered 2023-08-25: 21 [IU] via SUBCUTANEOUS
  Administered 2023-08-25: 5 [IU] via SUBCUTANEOUS
  Administered 2023-08-26: 8 [IU] via SUBCUTANEOUS
  Administered 2023-08-26: 11 [IU] via SUBCUTANEOUS

## 2023-08-23 MED ORDER — HEPARIN SODIUM (PORCINE) 5000 UNIT/ML IJ SOLN
5000.0000 [IU] | Freq: Three times a day (TID) | INTRAMUSCULAR | Status: DC
  Administered 2023-08-23 – 2023-08-26 (×11): 5000 [IU] via SUBCUTANEOUS
  Filled 2023-08-23 (×10): qty 1

## 2023-08-23 MED ORDER — SODIUM CHLORIDE 0.9 % IV SOLN
1.0000 g | INTRAVENOUS | Status: DC
  Administered 2023-08-23 – 2023-08-25 (×3): 1 g via INTRAVENOUS
  Filled 2023-08-23 (×3): qty 10

## 2023-08-23 MED ORDER — ACETAMINOPHEN 650 MG RE SUPP: 650.0000 mg | Freq: Four times a day (QID) | RECTAL | Status: DC | PRN

## 2023-08-23 MED ORDER — INSULIN ASPART 100 UNIT/ML IJ SOLN
0.0000 [IU] | Freq: Every day | INTRAMUSCULAR | Status: DC
  Administered 2023-08-23 – 2023-08-25 (×2): 3 [IU] via SUBCUTANEOUS

## 2023-08-23 MED ORDER — POTASSIUM CHLORIDE 10 MEQ/100ML IV SOLN
10.0000 meq | INTRAVENOUS | Status: AC
  Administered 2023-08-23 (×2): 10 meq via INTRAVENOUS
  Filled 2023-08-23 (×2): qty 100

## 2023-08-23 NOTE — Progress Notes (Signed)
Eeg attempted, as per ED RN, pt bed ready upstairs and about to be moved. Eeg to be reattempted when pt gets to new room

## 2023-08-23 NOTE — Consult Note (Addendum)
NEUROLOGY CONSULT NOTE   Date of service: August 23, 2023 Patient Name: Barbara Wang MRN:  [id number] DOB:  03-24-1966 Chief Complaint: "Altered mental status, right arm jerking movements" Requesting Provider: Tilden Fossa, MD  History of Present Illness  Barbara Wang is a 57 y.o. female past medical history of asthma, hypertension, hyperlipidemia, diabetes, prior history of DKA's, planum sphenoidale meningioma, presented to the emergency department for evaluation of altered mental status and jerking movements on the right side of the body.  Last known well reported to be the night prior to presentation maybe before that when they started noticing that she was just generally feeling weak.  The morning of 08/22/2023, the daughter left for work without seeing the patient awake.  When she returned back after long 12 to 14-hour shift, she did not really go inside the house but had her child report that the patient was awake at some point during the day but was not making much sense in terms of her speech and appeared confused.  When the daughter saw the patient she noted that her right arm has twitching and jerking movements and so this probably her legs.  She was brought in for evaluation in the hospital.  On initial labs, her sugar was greater than 700 on the glucometer.  Lab value of the glucose was 1008. Patient takes her medications herself but likely has not been very compliant due to multiple issues-some cognitive decline also being 1 of those issues-this is a sense that I got from speaking with the daughter who lives with her. Patient is not able to provide any history at this time Both daughters were at bedside providing history.    LKW: Unclear-at least 2 or even more days ago Modified rankin score: 3-Moderate disability-requires help but walks WITHOUT assistance -barely walks from the couch to the outside to smoke and sleeps most of the day.  Does not cook for  herself. IV Thrombolysis: No clear last known well, unclear if this is a seizure or stroke or movement disorder related to severe metabolic derangements EVT: Same as above  NIHSS components Score: Comment  1a Level of Conscious 0[]  1[x]  2[]  3[]      1b LOC Questions 0[]  1[x]  2[]       1c LOC Commands 0[x]  1[]  2[]       2 Best Gaze 0[x]  1[]  2[]       3 Visual 0[x]  1[]  2[]  3[]      4 Facial Palsy 0[x]  1[]  2[]  3[]      5a Motor Arm - left 0[x]  1[]  2[]  3[]  4[]  UN[]    5b Motor Arm - Right 0[x]  1[]  2[]  3[]  4[]  UN[]    6a Motor Leg - Left 0[x]  1[]  2[]  3[]  4[]  UN[]    6b Motor Leg - Right 0[x]  1[]  2[]  3[]  4[]  UN[]    7 Limb Ataxia 0[x]  1[]  2[]  3[]  UN[]     8 Sensory 0[x]  1[]  2[]  UN[]      9 Best Language 0[x]  1[]  2[]  3[]      10 Dysarthria 0[]  1[x]  2[]  UN[]      11 Extinct. and Inattention 0[x]  1[]  2[]       TOTAL: 3      ROS  Unable to ascertain due to altered mental status  Past History   Past Medical History:  Diagnosis Date   Asthma    Brain tumor (HCC)    Cellulitis 06/2015   rt hand    Depression    History of hiatal hernia    "it went  away on it's own"   Hyperlipemia    Hypertension    Neuropathy     "feet & hands "   Obesity    Pancreatitis    Type II diabetes mellitus (HCC)    insulin dependent     Past Surgical History:  Procedure Laterality Date   ABDOMINAL HYSTERECTOMY     BREAST CYST EXCISION Right 10+ yrs ago   abscess removed   CESAREAN SECTION  1981; 1987   CHOLECYSTECTOMY  05/11/2012   Procedure: LAPAROSCOPIC CHOLECYSTECTOMY WITH INTRAOPERATIVE CHOLANGIOGRAM;  Surgeon: Atilano Ina, MD,FACS;  Location: [facility] OR;  Service: General;  Laterality: N/A;   SHOULDER ARTHROSCOPY W/ ROTATOR CUFF REPAIR Right    TUBAL LIGATION  10/25/1999   Hattie Perch 01/11/2011    Family History: Family History  Problem Relation Age of Onset   Stroke Mother    Breast cancer Mother 27   Diabetes Father    Diabetes Sister    Breast cancer Maternal Aunt    Breast cancer Maternal Aunt     Breast cancer Maternal Grandmother     Social History  reports that she has been smoking cigarettes. She has a 40 pack-year smoking history. She has never used smokeless tobacco. She reports that she does not drink alcohol and does not use drugs.  Allergies  Allergen Reactions   Penicillins Hives and Shortness Of Breath    Has patient had a PCN reaction causing immediate rash, facial/tongue/throat swelling, SOB or lightheadedness with hypotension: Yes Has patient had a PCN reaction causing severe rash involving mucus membranes or skin necrosis: No Has patient had a PCN reaction that required hospitalization No Has patient had a PCN reaction occurring within the last 10 years: No If all of the above answers are "NO", then may proceed with Cephalosporin use.   Azithromycin Hives   Erythromycin Base Hives   Sulfa Antibiotics Hives   Tramadol Hives    Medications   Current Facility-Administered Medications:    dextrose 5 % in lactated ringers infusion, , Intravenous, Continuous, Tilden Fossa, MD   dextrose 50 % solution 0-50 mL, 0-50 mL, Intravenous, PRN, Tilden Fossa, MD   insulin regular, human (MYXREDLIN) 100 units/ 100 mL infusion, , Intravenous, Continuous, Tilden Fossa, MD   lactated ringers infusion, , Intravenous, Continuous, Tilden Fossa, MD   potassium chloride 10 mEq in 100 mL IVPB, 10 mEq, Intravenous, Q1H, Tilden Fossa, MD  Current Outpatient Medications:    acetaminophen (TYLENOL) 500 MG tablet, Take 1,000 mg by mouth every 6 (six) hours as needed for moderate pain., Disp: , Rfl:    albuterol (PROAIR HFA) 108 (90 Base) MCG/ACT inhaler, Inhale 1-2 puffs into the lungs every 6 (six) hours as needed for wheezing or shortness of breath., Disp: 18 g, Rfl: 1   albuterol (PROVENTIL) (2.5 MG/3ML) 0.083% nebulizer solution, Take 3 mLs (2.5 mg total) by nebulization every 6 (six) hours as needed for wheezing or shortness of breath., Disp: 150 mL, Rfl: 1   aspirin 81 MG  EC tablet, Take 1 tablet (81 mg total) by mouth daily., Disp: 90 tablet, Rfl: 3   atorvastatin (LIPITOR) 40 MG tablet, Take 1 tablet (40 mg total) by mouth daily at 6pm., Disp: 90 tablet, Rfl: 3   budesonide-formoterol (SYMBICORT) 160-4.5 MCG/ACT inhaler, Inhale 2 puffs into the lungs 2 (two) times daily., Disp: 10.2 g, Rfl: 6   chlorhexidine (PERIDEX) 0.12 % solution, Use as directed 15 mLs in the mouth or throat 2 (two) times daily., Disp: 1000  mL, Rfl: 3   fluticasone-salmeterol (ADVAIR HFA) 230-21 MCG/ACT inhaler, Inhale 2 puffs into the lungs 2 (two) times daily., Disp: 12 g, Rfl: 12   gabapentin (NEURONTIN) 600 MG tablet, Take 1 tablet (600 mg total) by mouth daily as needed (for neuropathy in hands and feet)., Disp: 90 tablet, Rfl: 1   insulin glargine (LANTUS SOLOSTAR) 100 UNIT/ML Solostar Pen, Inject 40 Units into the skin 2 (two) times daily., Disp: 45 mL, Rfl: 6   insulin lispro (HUMALOG KWIKPEN) 100 UNIT/ML KwikPen, Inject 12 Units into the skin 3 (three) times daily., Disp: 15 mL, Rfl: 11   lisinopril-hydrochlorothiazide (ZESTORETIC) 20-25 MG tablet, Take 1 tablet by mouth daily., Disp: 90 tablet, Rfl: 1   mometasone (ELOCON) 0.1 % cream, Apply topically daily., Disp: 60 g, Rfl: 1   pantoprazole (PROTONIX) 40 MG tablet, Take 1 tablet (40 mg total) by mouth daily., Disp: 30 tablet, Rfl: 2   Semaglutide,0.25 or 0.5MG /DOS, (OZEMPIC, 0.25 OR 0.5 MG/DOSE,) 2 MG/3ML SOPN, Inject 0.5 mg into the skin once a week., Disp: 3 mL, Rfl: 1   sertraline (ZOLOFT) 50 MG tablet, Take 1 tablet (50 mg total) by mouth daily. FOR DEPRESSION, Disp: 90 tablet, Rfl: 2   sodium chloride (OCEAN) 0.65 % SOLN nasal spray, Place 1 spray into both nostrils every 4 (four) hours as needed for congestion. (Patient not taking: Reported on 12/29/2022), Disp: 1 Bottle, Rfl: 0   trazodone (DESYREL) 300 MG tablet, Take 1 tablet (300 mg total) by mouth at bedtime., Disp: 90 tablet, Rfl: 1  Vitals   Vitals:   08/22/23 2326   BP: 108/87  Pulse: (!) 112  Resp: (!) 22  Temp: 98.4 F (36.9 C)  TempSrc: Oral  SpO2: 98%    There is no height or weight on file to calculate BMI.  Physical Exam  General: Well-developed well-nourished woman in no acute distress HEENT: Normocephalic atraumatic Lungs: Clear Cardiovascular: Regular rhythm and rate Neurological exam Lethargic and drowsy, answers questions and perseverates Able to tell me the current but not the current year.  Unable to tell me her age.  Was able to tell me her name.  To most of the other questions either replied with her name or the current month as the answer. Extremely poor attention concentration Dysarthric speech No evidence of aphasia-was able to name simple objects, follow simple commands  Cranial examination: Pupils are equal round react light, does not blink to threat from either side, difficult to ascertain extraocular movements given the amount of encephalopathy, face appears symmetric Motor examination reveals no drift in any of the 4 extremities.  Right upper extremity has jerky movements every few seconds but no clear asterixis.  Some jerking movements in the right leg as well with no drift in any of the 4 extremities. Sensation intact light touch Coordination difficult to assess given her mentation  Labs/Imaging/Neurodiagnostic studies   CBC:  Recent Labs  Lab 09-04-23 0004 Sep 04, 2023 0012 04-Sep-2023 0013  WBC 9.2  --   --   HGB 14.4 15.0 15.3*  HCT 42.1 44.0 45.0  MCV 81.7  --   --   PLT 533*  --   --    Basic Metabolic Panel:  Lab Results  Component Value Date   NA 128 (L) September 04, 2023   K 3.8 09-04-2023   CO2 14 (L) 09/04/2023   GLUCOSE >700 (HH) 2023/09/04   BUN 37 (H) 09-04-2023   CREATININE 2.30 (H) Sep 04, 2023   CALCIUM 10.2 04-Sep-2023   GFRNONAA  18 (L) 08/23/2023   GFRAA >60 01/17/2020   Lipid Panel:  Lab Results  Component Value Date   LDLCALC 54 07/31/2023   HgbA1c:  Lab Results  Component Value Date    HGBA1C 9.5 (A) 07/31/2023   Urine Drug Screen:     Component Value Date/Time   LABOPIA POSITIVE (A) 01/17/2020 2012   COCAINSCRNUR NONE DETECTED 01/17/2020 2012   LABBENZ NONE DETECTED 01/17/2020 2012   AMPHETMU NONE DETECTED 01/17/2020 2012   THCU NONE DETECTED 01/17/2020 2012   LABBARB NONE DETECTED 01/17/2020 2012    Alcohol Level     Component Value Date/Time   ETH <10 01/17/2020 2012   INR  Lab Results  Component Value Date   INR 1.0 01/17/2020   APTT  Lab Results  Component Value Date   APTT 23 (L) 01/17/2020   CT Head without contrast(Personally reviewed): Generalized atrophy, chronic microvascular disease.  Stable suprasellar meningioma.  Chronic right lacunar infarct.  ASSESSMENT   Barbara Wang is a 57 y.o. female past medical history of asthma, hypertension, hyperlipidemia, diabetes, prior history of DKA's, planum sphenoidale meningioma, presented to the emergency department for evaluation of altered mental status and jerking movements on the right side of the body. Sugars noted to be greater than 1000. Renal function deranged with creatinine doubled baseline. Examination reveals an encephalopathic patient with myoclonic jerks predominantly in the right upper and lower extremity  Myoclonic jerking in the setting of hyperglycemia versus provoked seizure in the setting of hyperglycemia as well as toxic metabolic encephalopathy are in the differentials.  RECOMMENDATIONS  Given her already not so good mental status, I would avoid benzos and AEDs for now. I would look her up to LTM EEG to characterize these movements-if there is any evidence of seizures, will treat with antiseizure occasions otherwise mainstay would be correction of the metabolic derangements especially hyperglycemia and deranged renal function. I would also recommend getting an MRI brain without contrast to rule out strokes causing movement disorder-this can happen after the EEG has been  completed and leads have been removed.  She is not within window for any stroke treatment.  Full stroke workup only if MRI reveals a stroke. Management of hyperglycemia, possible DKA as well as renal dysfunction per primary team Neurology will follow with you Plan was discussed with Dr. Pecola Leisure in the ER ______________________________________________________________________  Signed, Milon Dikes, MD [facility]   CRITICAL CARE ATTESTATION Performed by: Milon Dikes, MD Total critical care time: 40 minutes Critical care time was exclusive of separately billable procedures and treating other patients and/or supervising APPs/Residents/Students Critical care was necessary to treat or prevent imminent or life-threatening deterioration. This patient is critically ill and at significant risk for neurological worsening and/or death and care requires constant monitoring. Critical care was time spent personally by me on the following activities: development of treatment plan with patient and/or surrogate as well as nursing, discussions with consultants, evaluation of patient's response to treatment, examination of patient, obtaining history from patient or surrogate, ordering and performing treatments and interventions, ordering and review of laboratory studies, ordering and review of radiographic studies, pulse oximetry, re-evaluation of patient's condition, participation in multidisciplinary rounds and medical decision making of high complexity in the care of this patient.

## 2023-08-23 NOTE — Procedures (Addendum)
Patient Name: Barbara Wang  MRN: [id number]  Epilepsy Attending: Charlsie Quest  Referring Physician/Provider: Milon Dikes, MD  Duration: 12/26/ 2024 8416 to  08/24/2023 0427  Patient history: 57 yo F presented with altered mental status and jerking movements on the right side of the body. EEG to evaluate for seizure  Level of alertness:  lethargic --->awake, asleep  AEDs during EEG study: None  Technical aspects: This EEG study was done with scalp electrodes positioned according to the 10-20 International system of electrode placement. Electrical activity was reviewed with band pass filter of 1-70Hz , sensitivity of 7 uV/mm, display speed of 71mm/sec with a 60Hz  notched filter applied as appropriate. EEG data were recorded continuously and digitally stored.  Video monitoring was available and reviewed as appropriate.  Description: EEG initially showed continuous generalized predominantly 5-9Hz  theta-alpha activity admixed with intermittent 2-3 Hz delta slowing. Gradually EEG improved and showed posterior dominant rhythm of 8 Hz activity of moderate voltage (25-35 uV) seen predominantly in posterior head regions, symmetric and reactive to eye opening and eye closing. Sleep was characterized by vertex waves, sleep spindles (12 to 14 Hz), maximal frontocentral region. Hyperventilation and photic stimulation were not performed.     ABNORMALITY - Continuous slow, generalized  IMPRESSION: This study was initially suggestive of moderate diffuse encephalopathy. Gradually EEG improved and was within normal limits. No seizure or definite epileptiform discharges were noted.  [name]

## 2023-08-23 NOTE — Progress Notes (Signed)
Pt still in ED 

## 2023-08-23 NOTE — H&P (Signed)
History and Physical    Barbara Wang ZDG:[id number] DOB: 14-Dec-1965 DOA: 08/22/2023  PCP: Claiborne Rigg, NP  Patient coming from: Home  Chief Complaint: AMS  HPI: Barbara Wang is a 57 y.o. female with medical history significant of insulin-dependent type 2 diabetes, hypertension, hyperlipidemia, asthma, meningioma, depression, pancreatitis, obesity, chronic bronchitis, GERD, anemia presented to the ED via EMS for evaluation of altered mental status, hyperglycemia, and jerking movements of the right side of the body.  Vital signs on arrival: Temperature 98.4 F, pulse 112, respiratory rate 22, blood pressure 108/87, and SpO2 98% on room air.  Labs notable for WBC count 9.2, platelet count 533k, sodium 128, potassium 4.2, chloride 82, glucose 1008, bicarb 14, anion gap 32, BUN 39, creatinine 2.9 (baseline around 1.0), T. bili 2.1 and no elevation of transaminases or alkaline phosphatase, beta hydroxybutyric acid >8.0, VBG with pH 7.32, serum osmolarity pending, initial lactic acid 2.7> 1.9, UA and UDS pending.  Chest x-ray showing no active cardiopulmonary disease.  CT head showing stable suprasellar meningioma and no acute findings. Patient was given 2 L normal saline and started on IV insulin and IV fluids per DKA protocol.  She was also given IV potassium.  Neurology consulted and [facility] called to admit.  Patient is oriented to person and place but otherwise very confused and unable to give any history.  History provided by her 2 daughters at bedside.  She lives with one of her daughters but daughter works 12-hour shifts every day and is not sure if patient has been taking her home insulin.  Tonight when she returned from work she noticed that the patient was having jerking movements of her right upper and lower extremity.  Family is concerned that patient has been more lethargic and confused.  No recent illness reported.  Daughter states patient has chronic smoker's cough.  No  fevers, shortness of breath, vomiting, or diarrhea reported.  Also patient has not complained of any abdominal pain.  Review of Systems:  Review of Systems  Reason unable to perform ROS: AMS.    Past Medical History:  Diagnosis Date   Asthma    Brain tumor (HCC)    Cellulitis 06/2015   rt hand    Depression    History of hiatal hernia    "it went away on it's own"   Hyperlipemia    Hypertension    Neuropathy     "feet & hands "   Obesity    Pancreatitis    Type II diabetes mellitus (HCC)    insulin dependent     Past Surgical History:  Procedure Laterality Date   ABDOMINAL HYSTERECTOMY     BREAST CYST EXCISION Right 10+ yrs ago   abscess removed   CESAREAN SECTION  1981; 1987   CHOLECYSTECTOMY  05/11/2012   Procedure: LAPAROSCOPIC CHOLECYSTECTOMY WITH INTRAOPERATIVE CHOLANGIOGRAM;  Surgeon: Atilano Ina, MD,FACS;  Location: [facility] OR;  Service: General;  Laterality: N/A;   SHOULDER ARTHROSCOPY W/ ROTATOR CUFF REPAIR Right    TUBAL LIGATION  10/25/1999   Hattie Perch 01/11/2011     reports that she has been smoking cigarettes. She has a 40 pack-year smoking history. She has never used smokeless tobacco. She reports that she does not drink alcohol and does not use drugs.  Allergies  Allergen Reactions   Penicillins Hives and Shortness Of Breath    Has patient had a PCN reaction causing immediate rash, facial/tongue/throat swelling, SOB or lightheadedness with hypotension: Yes Has patient had  a PCN reaction causing severe rash involving mucus membranes or skin necrosis: No Has patient had a PCN reaction that required hospitalization No Has patient had a PCN reaction occurring within the last 10 years: No If all of the above answers are "NO", then may proceed with Cephalosporin use.   Azithromycin Hives   Erythromycin Base Hives   Sulfa Antibiotics Hives   Tramadol Hives    Family History  Problem Relation Age of Onset   Stroke Mother    Breast cancer Mother 40   Diabetes  Father    Diabetes Sister    Breast cancer Maternal Aunt    Breast cancer Maternal Aunt    Breast cancer Maternal Grandmother     Prior to Admission medications   Medication Sig Start Date End Date Taking? Authorizing Provider  acetaminophen (TYLENOL) 500 MG tablet Take 1,000 mg by mouth every 6 (six) hours as needed for moderate pain.   Yes [provider]  albuterol (PROAIR HFA) 108 (90 Base) MCG/ACT inhaler Inhale 1-2 puffs into the lungs every 6 (six) hours as needed for wheezing or shortness of breath. 07/31/23   Claiborne Rigg, NP  albuterol (PROVENTIL) (2.5 MG/3ML) 0.083% nebulizer solution Take 3 mLs (2.5 mg total) by nebulization every 6 (six) hours as needed for wheezing or shortness of breath. 07/31/23   Claiborne Rigg, NP  aspirin 81 MG EC tablet Take 1 tablet (81 mg total) by mouth daily. 12/12/19  Yes Claiborne Rigg, NP  atorvastatin (LIPITOR) 40 MG tablet Take 1 tablet (40 mg total) by mouth daily at 6pm. 07/31/23  Yes Claiborne Rigg, NP  budesonide-formoterol (SYMBICORT) 160-4.5 MCG/ACT inhaler Inhale 2 puffs into the lungs 2 (two) times daily. 07/31/23  Yes Claiborne Rigg, NP  chlorhexidine (PERIDEX) 0.12 % solution Use as directed 15 mLs in the mouth or throat 2 (two) times daily. Patient not taking: Reported on 08/23/2023 09/29/22   Claiborne Rigg, NP  fluticasone-salmeterol (ADVAIR HFA) [id number] MCG/ACT inhaler Inhale 2 puffs into the lungs 2 (two) times daily. 07/31/23   Claiborne Rigg, NP  gabapentin (NEURONTIN) 600 MG tablet Take 1 tablet (600 mg total) by mouth daily as needed (for neuropathy in hands and feet). 09/29/22   Claiborne Rigg, NP  insulin glargine (LANTUS SOLOSTAR) 100 UNIT/ML Solostar Pen Inject 40 Units into the skin 2 (two) times daily. 07/31/23  Yes Claiborne Rigg, NP  insulin lispro (HUMALOG KWIKPEN) 100 UNIT/ML KwikPen Inject 12 Units into the skin 3 (three) times daily. 07/31/23  Yes Claiborne Rigg, NP  lisinopril-hydrochlorothiazide  (ZESTORETIC) 20-25 MG tablet Take 1 tablet by mouth daily. 07/31/23   Claiborne Rigg, NP  mometasone (ELOCON) 0.1 % cream Apply topically daily. 07/31/23   Claiborne Rigg, NP  pantoprazole (PROTONIX) 40 MG tablet Take 1 tablet (40 mg total) by mouth daily. 04/20/23   Claiborne Rigg, NP  Semaglutide,0.25 or 0.5MG /DOS, (OZEMPIC, 0.25 OR 0.5 MG/DOSE,) 2 MG/3ML SOPN Inject 0.5 mg into the skin once a week. 07/31/23   Claiborne Rigg, NP  sertraline (ZOLOFT) 50 MG tablet Take 1 tablet (50 mg total) by mouth daily. FOR DEPRESSION 07/31/23   Claiborne Rigg, NP  trazodone (DESYREL) 300 MG tablet Take 1 tablet (300 mg total) by mouth at bedtime. 12/29/22   Claiborne Rigg, NP  insulin NPH Human (HUMULIN N) 100 UNIT/ML injection Inject 50 Units into the skin 2 times daily with meals for 30 days. (discard after  31 days) 09/23/21 10/06/21      Physical Exam: Vitals:   08/22/23 2326  BP: 108/87  Pulse: (!) 112  Resp: (!) 22  Temp: 98.4 F (36.9 C)  TempSrc: Oral  SpO2: 98%    Physical Exam Vitals reviewed.  Constitutional:      General: She is not in acute distress.    Appearance: She is ill-appearing.     Comments: Lethargic  HENT:     Head: Normocephalic and atraumatic.  Eyes:     Extraocular Movements: Extraocular movements intact.  Cardiovascular:     Rate and Rhythm: Normal rate and regular rhythm.     Pulses: Normal pulses.  Pulmonary:     Effort: Pulmonary effort is normal. No respiratory distress.     Breath sounds: Normal breath sounds. No wheezing or rales.  Abdominal:     General: Bowel sounds are normal. There is no distension.     Palpations: Abdomen is soft.     Tenderness: There is no abdominal tenderness. There is no guarding.  Musculoskeletal:     Cervical back: Normal range of motion.     Right lower leg: No edema.     Left lower leg: No edema.  Skin:    General: Skin is warm and dry.  Neurological:     Mental Status: She is alert.     Comments: Oriented to  person and place only, very confused She is able to move all extremities on command Noted to have slight jerking movements of her right upper extremity     Labs on Admission: I have personally reviewed following labs and imaging studies  CBC: Recent Labs  Lab 08/23/23 0004 08/23/23 0012 08/23/23 0013  WBC 9.2  --   --   HGB 14.4 15.0 15.3*  HCT 42.1 44.0 45.0  MCV 81.7  --   --   PLT 533*  --   --    Basic Metabolic Panel: Recent Labs  Lab 08/23/23 0004 08/23/23 0012 08/23/23 0013  NA 128* 128* 128*  K 4.2 3.8 3.8  CL 82*  --  86*  CO2 14*  --   --   GLUCOSE 1,008*  --  >700*  BUN 39*  --  37*  CREATININE 2.94*  --  2.30*  CALCIUM 10.2  --   --    GFR: CrCl cannot be calculated (Unknown ideal weight.). Liver Function Tests: Recent Labs  Lab 08/23/23 0004  AST 13*  ALT 14  ALKPHOS 105  BILITOT 2.1*  PROT 8.1  ALBUMIN 3.9   No results for input(s): "LIPASE", "AMYLASE" in the last 168 hours. No results for input(s): "AMMONIA" in the last 168 hours. Coagulation Profile: No results for input(s): "INR", "PROTIME" in the last 168 hours. Cardiac Enzymes: No results for input(s): "CKTOTAL", "CKMB", "CKMBINDEX", "TROPONINI" in the last 168 hours. BNP (last 3 results) No results for input(s): "PROBNP" in the last 8760 hours. HbA1C: No results for input(s): "HGBA1C" in the last 72 hours. CBG: Recent Labs  Lab 08/22/23 2331  GLUCAP >600*   Lipid Profile: No results for input(s): "CHOL", "HDL", "LDLCALC", "TRIG", "CHOLHDL", "LDLDIRECT" in the last 72 hours. Thyroid Function Tests: No results for input(s): "TSH", "T4TOTAL", "FREET4", "T3FREE", "THYROIDAB" in the last 72 hours. Anemia Panel: No results for input(s): "VITAMINB12", "FOLATE", "FERRITIN", "TIBC", "IRON", "RETICCTPCT" in the last 72 hours. Urine analysis:    Component Value Date/Time   COLORURINE YELLOW 05/14/2022 1711   APPEARANCEUR Cloudy (A) 07/31/2023 1705  LABSPEC 1.018 05/14/2022 1711    PHURINE 5.0 05/14/2022 1711   GLUCOSEU 2+ (A) 07/31/2023 1705   HGBUR MODERATE (A) 05/14/2022 1711   BILIRUBINUR Negative 07/31/2023 1705   KETONESUR small (15) (A) 12/29/2022 1226   KETONESUR NEGATIVE 05/14/2022 1711   PROTEINUR 1+ (A) 07/31/2023 1705   PROTEINUR 30 (A) 05/14/2022 1711   UROBILINOGEN 1.0 12/29/2022 1226   UROBILINOGEN 0.2 01/15/2016 1524   NITRITE Negative 07/31/2023 1705   NITRITE NEGATIVE 05/14/2022 1711   LEUKOCYTESUR Negative 07/31/2023 1705   LEUKOCYTESUR LARGE (A) 05/14/2022 1711    Radiological Exams on Admission: DG Chest Port 1 View Result Date: 08/23/2023 CLINICAL DATA:  Hyperglycemia, altered mental status. EXAM: PORTABLE CHEST 1 VIEW COMPARISON:  July 16, 2023 FINDINGS: The heart size and mediastinal contours are within normal limits. There is no evidence of an acute infiltrate, pleural effusion or pneumothorax. Multilevel degenerative changes seen throughout the thoracic spine. IMPRESSION: No active cardiopulmonary disease. Electronically Signed   By: Aram Candela M.D.   On: 08/23/2023 00:58   CT Head Wo Contrast Result Date: 08/23/2023 CLINICAL DATA:  Altered mental status EXAM: CT HEAD WITHOUT CONTRAST TECHNIQUE: Contiguous axial images were obtained from the base of the skull through the vertex without intravenous contrast. RADIATION DOSE REDUCTION: This exam was performed according to the departmental dose-optimization program which includes automated exposure control, adjustment of the mA and/or kV according to patient size and/or use of iterative reconstruction technique. COMPARISON:  July 16, 2023 FINDINGS: Brain: There is mild cerebral atrophy with widening of the extra-axial spaces and ventricular dilatation. There are areas of decreased attenuation within the white matter tracts of the supratentorial brain, consistent with microvascular disease changes. A chronic right lacunar infarct is again seen. A stable 2.8 cm x 2.1 cm suprasellar mass  is seen (best seen on coronal reformatted image 41, CT series 5/sagittal reformatted image 31, CT series 6). Vascular: No hyperdense vessel or unexpected calcification. Skull: Normal. Negative for fracture or focal lesion. Sinuses/Orbits: No acute finding. Other: None. IMPRESSION: 1. Generalized cerebral atrophy and microvascular disease changes of the supratentorial brain. 2. Stable suprasellar meningioma, confirmed on prior MRI valuation. 3. Chronic right lacunar infarct. 4. No acute intracranial abnormality. Electronically Signed   By: Aram Candela M.D.   On: 08/23/2023 00:55    EKG: Independently reviewed.  Sinus tachycardia, baseline wander, QTc 490.  Assessment and Plan  DKA Poorly controlled insulin-dependent type 2 diabetes Suspect insulin noncompliance.  Last A1c 9.5 on 07/31/2023.  Patient is presenting with lethargy, confusion, and significant hyperglycemia.  Glucose 1008, bicarb 14, anion gap 32, beta hydroxybutyric acid >8.0, VBG with pH 7.32, serum osmolarity pending.  Keep n.p.o. Patient was given 2 L normal saline boluses in the ED.  Continue IV insulin and IV fluids per DKA protocol.  Monitor BMP every 4 hours.  Frequent CBG checks per Endo tool.  AKI Likely due to severe dehydration. BUN 39, creatinine 2.9 (baseline around 1.0).  Continue IV fluid hydration and monitor renal function.  Avoid nephrotoxic agents/hold home lisinopril and hydrochlorothiazide.  Myoclonic jerking movements of the right upper and lower extremity Neurology ordered LTM EEG and recommending avoiding benzodiazepines and AEDs for now.  Seizure precautions.  Neurology also recommending obtaining brain MRI without contrast to rule out stroke causing movement disorder.  Full stroke workup only if MRI reveals a stroke.  Acute metabolic encephalopathy Likely due to DKA.  CT head showing stable suprasellar meningioma and no acute findings. Brain MRI and EEG  ordered as mentioned above.  UA and UDS pending.  Hold  home gabapentin and trazodone.  Avoid any other sedating medications.  Mild lactic acidosis (resolved) Severe dehydration likely contributing.  No infectious signs or symptoms.  Patient is afebrile and has no leukocytosis on labs.  Not hypotensive.  Chest x-ray not suggestive of pneumonia.  UA pending.  QT prolongation QTc 490 on EKG. Monitor potassium and magnesium levels, replace as needed.  Avoid QT prolonging drugs.  Thrombocytosis Continue to monitor CBC.  Mild elevation of total bilirubin T. bili 2.1 and no elevation of transaminases or alkaline phosphatase.  Repeat LFTs in the morning.  Hypertension: Currently normotensive. Hyperlipidemia Asthma Depression GERD Pharmacy med rec pending.  DVT prophylaxis: SQ Heparin Code Status: Full Code (discussed with the patient's daughters) Family Communication: Daughters at bedside. Consults called: Neurology Level of care: Progressive Care Unit Admission status: It is my clinical opinion that referral for OBSERVATION is reasonable and necessary in this patient based on the above information provided. The aforementioned taken together are felt to place the patient at high risk for further clinical deterioration. However, it is anticipated that the patient may be medically stable for discharge from the hospital within 24 to 48 hours.  John Giovanni MD [facility]  If 7PM-7AM, please contact night-coverage [email or web address]  08/23/2023, 2:14 AM

## 2023-08-23 NOTE — Progress Notes (Addendum)
LTM EEG running - no initial skin breakdown - push button tested - neuro notified. No wired network in room, pt running on wifi [employer] Very difficult impedences due to weathered scalp and pt sensitivity. Towards end of hookup pt began swatting at tech.  HU charge captured

## 2023-08-23 NOTE — ED Notes (Signed)
Pt transported to 5W on cardiac monitor. Unable to move OTF d/t locked chart by another user.

## 2023-08-23 NOTE — Plan of Care (Signed)

## 2023-08-23 NOTE — ED Notes (Signed)
ED TO INPATIENT HANDOFF REPORT  ED Nurse Name and Phone #: Berna Spare [phone]   S Name/Age/Gender Barbara Wang 57 y.o. female Room/Bed: 020C/020C  Code Status   Code Status: Prior  Home/SNF/Other Home Patient oriented to: self Is this baseline? No   Triage Complete: Triage complete  Chief Complaint DKA (diabetic ketoacidosis) (HCC) [E11.10]  Triage Note Per ems, pt from home. Daughter called ems d/t pt acting "off" today. CBG at home read "HI." Pt also developed a new "twitch" to right hand. EMS EKG tachycardia 111, otherwise VSS. GCS 15. Ambulatory at baseline. 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 CBG in triage read "HI." Pt lethargic, slow to respond to questioning.    Allergies Allergies  Allergen Reactions   Penicillins Hives and Shortness Of Breath    Has patient had a PCN reaction causing immediate rash, facial/tongue/throat swelling, SOB or lightheadedness with hypotension: Yes Has patient had a PCN reaction causing severe rash involving mucus membranes or skin necrosis: No Has patient had a PCN reaction that required hospitalization No Has patient had a PCN reaction occurring within the last 10 years: No If all of the above answers are "NO", then may proceed with Cephalosporin use.   Azithromycin Hives   Erythromycin Base Hives   Sulfa Antibiotics Hives   Tramadol Hives    Level of Care/Admitting Diagnosis ED Disposition     ED Disposition  Admit   Condition  --   Comment  Hospital Area: [facility] [[id number]]  Level of Care: Progressive [102]  Admit to Progressive based on following criteria: GI, ENDOCRINE disease patients with GI bleeding, acute liver failure or pancreatitis, stable with diabetic ketoacidosis or thyrotoxicosis (hypothyroid) state.  Admit to Progressive based on following criteria: NEUROLOGICAL AND NEUROSURGICAL complex patients with significant risk of instability, who do not meet ICU criteria, yet require  close observation or frequent assessment (< / = every 2 - 4 hours) with medical / nursing intervention.  May place patient in observation at [facility] or [facility] if equivalent level of care is available:: Yes  Covid Evaluation: Asymptomatic - no recent exposure (last 10 days) testing not required  Diagnosis: DKA (diabetic ketoacidosis) [facility]) [[id number]]  Admitting Physician: John Giovanni [[id number]]  Attending Physician: John Giovanni [[id number]]          B Medical/Surgery History Past Medical History:  Diagnosis Date   Asthma    Brain tumor (HCC)    Cellulitis 06/2015   rt hand    Depression    History of hiatal hernia    "it went away on it's own"   Hyperlipemia    Hypertension    Neuropathy     "feet & hands "   Obesity    Pancreatitis    Type II diabetes mellitus (HCC)    insulin dependent    Past Surgical History:  Procedure Laterality Date   ABDOMINAL HYSTERECTOMY     BREAST CYST EXCISION Right 10+ yrs ago   abscess removed   CESAREAN SECTION  1981; 1987   CHOLECYSTECTOMY  05/11/2012   Procedure: LAPAROSCOPIC CHOLECYSTECTOMY WITH INTRAOPERATIVE CHOLANGIOGRAM;  Surgeon: Atilano Ina, MD,FACS;  Location: [facility] OR;  Service: General;  Laterality: N/A;   SHOULDER ARTHROSCOPY W/ ROTATOR CUFF REPAIR Right    TUBAL LIGATION  10/25/1999   Hattie Perch 01/11/2011     A IV Location/Drains/Wounds Patient Lines/Drains/Airways Status     Active Line/Drains/Airways     Name Placement date Placement time Site Days  Peripheral IV 08/22/23 20 G Right Antecubital 08/22/23  2320  Antecubital  1   Peripheral IV 08/23/23 20 G Left;Posterior Hand 08/23/23  0020  Hand  less than 1   Peripheral IV 08/23/23 22 G Posterior;Right Wrist 08/23/23  0100  Wrist  less than 1            Intake/Output Last 24 hours No intake or output data in the 24 hours ending 08/23/23 0147  Labs/Imaging Results for orders placed or performed during the hospital encounter of 08/22/23 (from the  past 48 hours)  CBG monitoring, ED     Status: Abnormal   Collection Time: 08/22/23 11:31 PM  Result Value Ref Range   Glucose-Capillary >600 (HH) 70 - 99 mg/dL    Comment: Glucose reference range applies only to samples taken after fasting for at least 8 hours.  CBC     Status: Abnormal   Collection Time: 08/23/23 12:04 AM  Result Value Ref Range   WBC 9.2 4.0 - 10.5 K/uL   RBC 5.15 (H) 3.87 - 5.11 MIL/uL   Hemoglobin 14.4 12.0 - 15.0 g/dL   HCT 47.8 29.5 - 62.1 %   MCV 81.7 80.0 - 100.0 fL   MCH 28.0 26.0 - 34.0 pg   MCHC 34.2 30.0 - 36.0 g/dL   RDW 30.8 65.7 - 84.6 %   Platelets 533 (H) 150 - 400 K/uL   nRBC 0.0 0.0 - 0.2 %    Comment: Performed at [facility], [address], [place], [place] [address]  Comprehensive metabolic panel     Status: Abnormal   Collection Time: 08/23/23 12:04 AM  Result Value Ref Range   Sodium 128 (L) 135 - 145 mmol/L   Potassium 4.2 3.5 - 5.1 mmol/L   Chloride 82 (L) 98 - 111 mmol/L   CO2 14 (L) 22 - 32 mmol/L   Glucose, Bld 1,008 (HH) 70 - 99 mg/dL    Comment: CRITICAL RESULT CALLED TO, READ BACK BY AND VERIFIED WITH Zenaida Deed, RN [phone] 08/23/2023 SANDOVAL K Glucose reference range applies only to samples taken after fasting for at least 8 hours.    BUN 39 (H) 6 - 20 mg/dL   Creatinine, Ser 3.24 (H) 0.44 - 1.00 mg/dL   Calcium 40.1 8.9 - 02.7 mg/dL   Total Protein 8.1 6.5 - 8.1 g/dL   Albumin 3.9 3.5 - 5.0 g/dL   AST 13 (L) 15 - 41 U/L   ALT 14 0 - 44 U/L   Alkaline Phosphatase 105 38 - 126 U/L   Total Bilirubin 2.1 (H) <1.2 mg/dL   GFR, Estimated 18 (L) >60 mL/min    Comment: (NOTE) Calculated using the CKD-EPI Creatinine Equation (2021)    Anion gap 32 (H) 5 - 15    Comment: ELECTROLYTES REPEATED TO VERIFY Performed at [facility], [address] [place], [place] [address]   Beta-hydroxybutyric acid     Status: Abnormal   Collection Time: 08/23/23 12:04 AM  Result Value Ref Range   Beta-Hydroxybutyric Acid >8.00 (H)  0.05 - 0.27 mmol/L    Comment: RESULT CONFIRMED BY MANUAL DILUTION Performed at [facility], [address] [place], [place] [address]   I-Stat venous blood gas, [facility], [facility], [facility])     Status: Abnormal   Collection Time: 08/23/23 12:12 AM  Result Value Ref Range   pH, Ven 7.324 7.25 - 7.43   pCO2, Ven 35.1 (L) 44 - 60 mmHg  pO2, Ven 45 32 - 45 mmHg   Bicarbonate 18.3 (L) 20.0 - 28.0 mmol/L   TCO2 19 (L) 22 - 32 mmol/L   O2 Saturation 78 %   Acid-base deficit 7.0 (H) 0.0 - 2.0 mmol/L   Sodium 128 (L) 135 - 145 mmol/L   Potassium 3.8 3.5 - 5.1 mmol/L   Calcium, Ion 1.14 (L) 1.15 - 1.40 mmol/L   HCT 44.0 36.0 - 46.0 %   Hemoglobin 15.0 12.0 - 15.0 g/dL   Sample type VENOUS   I-stat chem 8, ed     Status: Abnormal   Collection Time: 08/23/23 12:13 AM  Result Value Ref Range   Sodium 128 (L) 135 - 145 mmol/L   Potassium 3.8 3.5 - 5.1 mmol/L   Chloride 86 (L) 98 - 111 mmol/L   BUN 37 (H) 6 - 20 mg/dL   Creatinine, Ser 4.09 (H) 0.44 - 1.00 mg/dL   Glucose, Bld >811 (HH) 70 - 99 mg/dL    Comment: Glucose reference range applies only to samples taken after fasting for at least 8 hours.   Calcium, Ion 1.16 1.15 - 1.40 mmol/L   TCO2 17 (L) 22 - 32 mmol/L   Hemoglobin 15.3 (H) 12.0 - 15.0 g/dL   HCT 91.4 78.2 - 95.6 %   Comment NOTIFIED PHYSICIAN   I-Stat Lactic Acid     Status: Abnormal   Collection Time: 08/23/23 12:29 AM  Result Value Ref Range   Lactic Acid, Venous 2.7 (HH) 0.5 - 1.9 mmol/L   Comment NOTIFIED PHYSICIAN    DG Chest Port 1 View Result Date: 08/23/2023 CLINICAL DATA:  Hyperglycemia, altered mental status. EXAM: PORTABLE CHEST 1 VIEW COMPARISON:  July 16, 2023 FINDINGS: The heart size and mediastinal contours are within normal limits. There is no evidence of an acute infiltrate, pleural effusion or pneumothorax. Multilevel degenerative changes seen throughout the thoracic spine. IMPRESSION: No active cardiopulmonary disease. Electronically Signed   By:  Aram Candela M.D.   On: 08/23/2023 00:58   CT Head Wo Contrast Result Date: 08/23/2023 CLINICAL DATA:  Altered mental status EXAM: CT HEAD WITHOUT CONTRAST TECHNIQUE: Contiguous axial images were obtained from the base of the skull through the vertex without intravenous contrast. RADIATION DOSE REDUCTION: This exam was performed according to the departmental dose-optimization program which includes automated exposure control, adjustment of the mA and/or kV according to patient size and/or use of iterative reconstruction technique. COMPARISON:  July 16, 2023 FINDINGS: Brain: There is mild cerebral atrophy with widening of the extra-axial spaces and ventricular dilatation. There are areas of decreased attenuation within the white matter tracts of the supratentorial brain, consistent with microvascular disease changes. A chronic right lacunar infarct is again seen. A stable 2.8 cm x 2.1 cm suprasellar mass is seen (best seen on coronal reformatted image 41, CT series 5/sagittal reformatted image 31, CT series 6). Vascular: No hyperdense vessel or unexpected calcification. Skull: Normal. Negative for fracture or focal lesion. Sinuses/Orbits: No acute finding. Other: None. IMPRESSION: 1. Generalized cerebral atrophy and microvascular disease changes of the supratentorial brain. 2. Stable suprasellar meningioma, confirmed on prior MRI valuation. 3. Chronic right lacunar infarct. 4. No acute intracranial abnormality. Electronically Signed   By: Aram Candela M.D.   On: 08/23/2023 00:55    Pending Labs Unresulted Labs (From admission, onward)     Start     Ordered   08/23/23 0105  Basic metabolic panel  (Hyperglycemic Hyperosmolar State (HHS))  STAT Now then every 4 hours ,  STAT      08/23/23 0105   08/23/23 0105  Osmolality  (Hyperglycemic Hyperosmolar State (HHS))  Once,   URGENT        08/23/23 0105   08/23/23 0102  Urine rapid drug screen (hosp performed)  Once,   STAT        08/23/23 0101    08/23/23 0102  Urine Culture  Once,   URGENT       Question:  Indication  Answer:  Sepsis   08/23/23 0101   08/22/23 2328  Urinalysis, Routine w reflex microscopic -Urine, Clean Catch  Once,   URGENT       Question:  Specimen Source  Answer:  Urine, Clean Catch   08/22/23 2328            Vitals/Pain Today's Vitals   08/22/23 2326 08/22/23 2327  BP: 108/87   Pulse: (!) 112   Resp: (!) 22   Temp: 98.4 F (36.9 C)   TempSrc: Oral   SpO2: 98%   PainSc:  0-No pain    Isolation Precautions No active isolations  Medications Medications  insulin regular, human (MYXREDLIN) 100 units/ 100 mL infusion (11.5 Units/hr Intravenous New Bag/Given 08/23/23 0145)  lactated ringers infusion (has no administration in time range)  dextrose 5 % in lactated ringers infusion (has no administration in time range)  dextrose 50 % solution 0-50 mL (has no administration in time range)  potassium chloride 10 mEq in 100 mL IVPB (has no administration in time range)  sodium chloride 0.9 % bolus 1,000 mL (1,000 mLs Intravenous New Bag/Given 08/23/23 0006)  sodium chloride 0.9 % bolus 1,000 mL (1,000 mLs Intravenous New Bag/Given 08/23/23 0113)    Mobility walks with person assist     Focused Assessments Tremor/spasm to RUE. Neurology evaluated. EEG ordered.   R Recommendations: See Admitting Provider Note  Report given to:   Additional Notes: Call or text with any additional questions.

## 2023-08-23 NOTE — Progress Notes (Signed)
Per ED, pt moved, per front desk, unable to reach RN, per Epic, pt still in ED. Will attempt as schedule allows

## 2023-08-23 NOTE — Evaluation (Signed)
Clinical/Bedside Swallow Evaluation Patient Details  Name: Barbara Wang MRN: [id number] Date of Birth: 1965/12/04  Today's Date: 08/23/2023 Time: SLP Start Time (ACUTE ONLY): 0957 SLP Stop Time (ACUTE ONLY): 1011 SLP Time Calculation (min) (ACUTE ONLY): 14 min  Past Medical History:  Past Medical History:  Diagnosis Date   Asthma    Brain tumor (HCC)    Cellulitis 06/2015   rt hand    Depression    History of hiatal hernia    "it went away on it's own"   Hyperlipemia    Hypertension    Neuropathy     "feet & hands "   Obesity    Pancreatitis    Type II diabetes mellitus (HCC)    insulin dependent    Past Surgical History:  Past Surgical History:  Procedure Laterality Date   ABDOMINAL HYSTERECTOMY     BREAST CYST EXCISION Right 10+ yrs ago   abscess removed   CESAREAN SECTION  1981; 1987   CHOLECYSTECTOMY  05/11/2012   Procedure: LAPAROSCOPIC CHOLECYSTECTOMY WITH INTRAOPERATIVE CHOLANGIOGRAM;  Surgeon: Atilano Ina, MD,FACS;  Location: [facility] OR;  Service: General;  Laterality: N/A;   SHOULDER ARTHROSCOPY W/ ROTATOR CUFF REPAIR Right    TUBAL LIGATION  10/25/1999   Hattie Perch 01/11/2011   HPI:  Barbara Wang is a 57 yo female presenting to ED 12/25 with AMS, hyperglycemia, and R sided extremity jerking movements. Admitted with DKA and acute encephalopathy as well as LTM to rule out seizures. PMH includes T2DM, HTN, HLD, asthma, meningioma, depression, pancreatitis, chronic bronchitis, GERD, anemia    Assessment / Plan / Recommendation  Clinical Impression  Pt asleep upon SLP arrival, but roused easily to voice. She followed commands to complete an oral motor exam, which was [facility]. She took multiple large, consecutive sips of thin liquids via straw without s/s of aspiration. Pt had prolonged oral holding of purees and solids, although initiated a swallow when given a subsequent sip of water. Pt did not consitently follow commands to swallow in these moments of oral  holding. Suspect pt's mentation remains a significant barrier to achieving adequate nutrition but recommend initiating a full liquid diet. Discussed with RN. SLP will continue following to assess pt's ability to upgrade diet as mentation allows. SLP Visit Diagnosis: Dysphagia, unspecified (R13.10)    Aspiration Risk  Mild aspiration risk    Diet Recommendation Thin liquid    Liquid Administration via: Straw;Cup;Spoon Medication Administration: Via alternative means Supervision: Staff to assist with self feeding;Full supervision/cueing for compensatory strategies Compensations: Minimize environmental distractions;Slow rate;Small sips/bites Postural Changes: Seated upright at 90 degrees    Other  Recommendations Oral Care Recommendations: Oral care BID    Recommendations for follow up therapy are one component of a multi-disciplinary discharge planning process, led by the attending physician.  Recommendations may be updated based on patient status, additional functional criteria and insurance authorization.  Follow up Recommendations No SLP follow up      Assistance Recommended at Discharge    Functional Status Assessment Patient has had a recent decline in their functional status and demonstrates the ability to make significant improvements in function in a reasonable and predictable amount of time.  Frequency and Duration min 2x/week  2 weeks       Prognosis Prognosis for improved oropharyngeal function: Good Barriers to Reach Goals: Cognitive deficits;Time post onset      Swallow Study   General HPI: Barbara Wang is a 57 yo female presenting to ED 12/25  with AMS, hyperglycemia, and R sided extremity jerking movements. Admitted with DKA and acute encephalopathy as well as LTM to rule out seizures. PMH includes T2DM, HTN, HLD, asthma, meningioma, depression, pancreatitis, chronic bronchitis, GERD, anemia Type of Study: Bedside Swallow Evaluation Previous Swallow  Assessment: none in chart Diet Prior to this Study: NPO Temperature Spikes Noted: No Respiratory Status: Room air History of Recent Intubation: No Behavior/Cognition: Alert;Cooperative;Requires cueing Oral Cavity Assessment: Dry Oral Care Completed by SLP: No Oral Cavity - Dentition: Adequate natural dentition Vision: Functional for self-feeding Self-Feeding Abilities: Needs assist Patient Positioning: Upright in bed Baseline Vocal Quality: Normal Volitional Cough: Weak Volitional Swallow: Able to elicit    Oral/Motor/Sensory Function Overall Oral Motor/Sensory Function: Within functional limits   Ice Chips Ice chips: Not tested   Thin Liquid Thin Liquid: Within functional limits Presentation: Straw    Nectar Thick Nectar Thick Liquid: Not tested   Honey Thick Honey Thick Liquid: Not tested   Puree Puree: Impaired Presentation: Spoon Oral Phase Impairments: Reduced lingual movement/coordination;Poor awareness of bolus Oral Phase Functional Implications: Oral holding;Prolonged oral transit   Solid     Solid: Impaired Oral Phase Impairments: Impaired mastication;Poor awareness of bolus Oral Phase Functional Implications: Prolonged oral transit;Impaired mastication;Oral holding      Gwynneth Aliment, M.A., CF-SLP Speech Language Pathology, Acute Rehabilitation Services  Secure Chat preferred [phone]  08/23/2023,10:32 AM

## 2023-08-23 NOTE — Progress Notes (Addendum)
[facility] PROGRESS NOTE   Barbara Wang GEX:[id number] DOB: October 25, 1965 DOA: 08/22/2023  PCP: Claiborne Rigg, NP  Brief History:  57 y.o. female with medical history significant of insulin-dependent type 2 diabetes, hypertension, hyperlipidemia, asthma, meningioma, depression, pancreatitis, obesity, chronic bronchitis, GERD, anemia presented to the ED via EMS for evaluation of altered mental status, hyperglycemia, and jerking movements of the right side of the body.  She was found to have diabetic ketoacidosis.  There was concern for seizure activity.  Neurology was consulted.  Patient was hospitalized.   Consultants: Neurology  Procedures: LTM    Subjective/Interval History: Patient very lethargic.  Arouses but goes right back to sleep.  Follows simple commands.  Daughters are at the bedside.   Assessment/Plan:  Diabetic ketoacidosis/insulin-dependent diabetes mellitus type 2 HbA1c was 9.5 on 07/31/2023.  Suspect noncompliance at home. Came in with anion gap of 32.  Started on insulin infusion.  Started on IV fluids.  Labs from this morning are pending.  Continue IV insulin until anion gap is closed and until mentation has improved before transitioning to subcutaneous insulin. No obvious source of infection identified.  WBC noted to be normal.  UA is pending.  She was mildly tender in the left lower quadrant of her abdomen on palpation.  Abdominal film has been ordered.  Daughter is uncertain as to her last bowel movement.  Bladder scan. UA noted to be abnormal. PCN allergy noted but she has received cephalosporins previously. Will start Ceftriaxone.  She has had 3 different attempts at blood draws today all of which have clotted. Another attempt is in progress.  Acute kidney injury Baseline creatinine seems to be around 1.2 or so.  She was on lisinopril and HCTZ at home which is currently on hold. AKI thought to be due to severe dehydration.  Came in with  creatinine of 2.9.  Being aggressively hydrated.  Monitor urine output.  Bladder scans.  Continue to monitor labs.  Myoclonic jerking of the right upper and lower extremity Neurology has seen the patient.  On LTM.  Recommending MRI as well.  Unclear if this can be done while she is on LTM.  Will defer to neurology.  She does not seem to have any focal deficits currently.  Normocytic anemia Significant drop in hemoglobin is noted this morning at 9.1.  Was 14.4 overnight.  No overt bleeding has been noted.  Could be an erroneous read.  Will repeat labs.  Acute metabolic encephalopathy Secondary to DKA.  Currently able to protect her airway.  Monitor closely.  N.p.o. status.  QT prolongation Monitor on telemetry.  Hyperbilirubinemia Continue to monitor trends.  History of essential hypertension Monitor blood pressures closely.  All of her home medications are currently on hold.  Obesity Estimated body mass index is 32.61 kg/m as calculated from the following:   Height as of 07/31/23: 5\' 4"  (1.626 m).   Weight as of 07/31/23: 86.2 kg.   DVT Prophylaxis: Subcutaneous heparin Code Status: Full code Family Communication: Discussed with her daughters Disposition Plan: To be determined  Status is: Observation The patient will require care spanning > 2 midnights and should be moved to inpatient because: DKA, encephalopathy      Medications: Scheduled:  heparin  5,000 Units Subcutaneous Q8H   Continuous:  dextrose 5% lactated ringers 125 mL/hr at 08/23/23 0944   insulin 3.4 Units/hr (08/23/23 0944)   lactated ringers Stopped (08/23/23 0938)   potassium chloride     WNU:[id number] **OR** acetaminophen,  dextrose  Antibiotics: Anti-infectives (From admission, onward)    None       Objective:  Vital Signs  Vitals:   08/23/23 0530 08/23/23 0545 08/23/23 0600 08/23/23 0900  BP:  138/71 126/66 (!) 117/57  Pulse:  (!) 106 (!) 104 88  Resp: (!) 21 17 17 18   Temp:       TempSrc:      SpO2: 92% 94% 96% 95%    Intake/Output Summary (Last 24 hours) at 08/23/2023 0942 Last data filed at 08/23/2023 0940 Gross per 24 hour  Intake 639.03 ml  Output 300 ml  Net 339.03 ml   There were no vitals filed for this visit.  General appearance: Lethargic.  Arousable but goes right back to sleep. Resp: Clear to auscultation bilaterally.  Normal effort Cardio: S1-S2 is normal regular.  No S3-S4.  No rubs murmurs or bruit GI: Abdomen is soft.  Tender in the left lower quadrant without any rebound rigidity or guarding.  No masses organomegaly. Extremities: Physical deconditioning noted Neurologic: Oriented.  No obvious facial asymmetry.  No obvious focal neurological deficits.   Lab Results:  Data Reviewed: I have personally reviewed following labs and reports of the imaging studies  CBC: Recent Labs  Lab 08/23/23 0004 08/23/23 0012 08/23/23 0013 08/23/23 0456  WBC 9.2  --   --  5.8  HGB 14.4 15.0 15.3* 9.1*  HCT 42.1 44.0 45.0 26.1*  MCV 81.7  --   --  79.8*  PLT 533*  --   --  331    Basic Metabolic Panel: Recent Labs  Lab 08/23/23 0004 08/23/23 0012 08/23/23 0013 08/23/23 0228  NA 128* 128* 128* 129*  K 4.2 3.8 3.8 4.5  CL 82*  --  86* 79*  CO2 14*  --   --  14*  GLUCOSE 1,008*  --  >700* 968*  BUN 39*  --  37* 39*  CREATININE 2.94*  --  2.30* 2.93*  CALCIUM 10.2  --   --  10.2    GFR: CrCl cannot be calculated (Unknown ideal weight.).  Liver Function Tests: Recent Labs  Lab 08/23/23 0004  AST 13*  ALT 14  ALKPHOS 105  BILITOT 2.1*  PROT 8.1  ALBUMIN 3.9     CBG: Recent Labs  Lab 08/23/23 0549 08/23/23 0641 08/23/23 0714 08/23/23 0828 08/23/23 0934  GLUCAP 458* 436* 394* 312* 211*     Radiology Studies: DG Chest Port 1 View Result Date: 08/23/2023 CLINICAL DATA:  Hyperglycemia, altered mental status. EXAM: PORTABLE CHEST 1 VIEW COMPARISON:  July 16, 2023 FINDINGS: The heart size and mediastinal contours are  within normal limits. There is no evidence of an acute infiltrate, pleural effusion or pneumothorax. Multilevel degenerative changes seen throughout the thoracic spine. IMPRESSION: No active cardiopulmonary disease. Electronically Signed   By: Aram Candela M.D.   On: 08/23/2023 00:58   CT Head Wo Contrast Result Date: 08/23/2023 CLINICAL DATA:  Altered mental status EXAM: CT HEAD WITHOUT CONTRAST TECHNIQUE: Contiguous axial images were obtained from the base of the skull through the vertex without intravenous contrast. RADIATION DOSE REDUCTION: This exam was performed according to the departmental dose-optimization program which includes automated exposure control, adjustment of the mA and/or kV according to patient size and/or use of iterative reconstruction technique. COMPARISON:  July 16, 2023 FINDINGS: Brain: There is mild cerebral atrophy with widening of the extra-axial spaces and ventricular dilatation. There are areas of decreased attenuation within the white matter tracts of the supratentorial  brain, consistent with microvascular disease changes. A chronic right lacunar infarct is again seen. A stable 2.8 cm x 2.1 cm suprasellar mass is seen (best seen on coronal reformatted image 41, CT series 5/sagittal reformatted image 31, CT series 6). Vascular: No hyperdense vessel or unexpected calcification. Skull: Normal. Negative for fracture or focal lesion. Sinuses/Orbits: No acute finding. Other: None. IMPRESSION: 1. Generalized cerebral atrophy and microvascular disease changes of the supratentorial brain. 2. Stable suprasellar meningioma, confirmed on prior MRI valuation. 3. Chronic right lacunar infarct. 4. No acute intracranial abnormality. Electronically Signed   By: Aram Candela M.D.   On: 08/23/2023 00:55       LOS: 0 days   Barbara Wang Barbara Wang  [facility] Pager on [email or web address]  08/23/2023, 9:42 AM

## 2023-08-23 NOTE — Progress Notes (Signed)
Patient arrived from ER , alert, nonverbal, lethargic, weakness. PT has DKA. Patient is on  IV Insulin drip DKA protocol. Right hand stiffness. Patient has good urine out put , and saturated chucks. K+ 2.7 was given 2 bag K+ per ordered . Skin is intact, no bruising.

## 2023-08-23 NOTE — Progress Notes (Addendum)
MB performed maintenance on electrodes as per patient would allow me to while trying to sleep. Pt. was refusing my and others care, blood draws labs and x-ray while her and family members were wanting to sleep and did not want to move or exit the room for care and x-ray. Floor front desk nurse was made aware. MB also troubleshoot and switched out equipment due to repeated network connection and a aborted message after multiple attempts (see pics). A ticket for failed internet port will be submitted for room [phone].  No skin breakdown noted during limited maintenance.

## 2023-08-24 ENCOUNTER — Inpatient Hospital Stay (HOSPITAL_COMMUNITY): Payer: Commercial Managed Care - HMO

## 2023-08-24 DIAGNOSIS — E1165 Type 2 diabetes mellitus with hyperglycemia: Secondary | ICD-10-CM

## 2023-08-24 DIAGNOSIS — G253 Myoclonus: Secondary | ICD-10-CM

## 2023-08-24 DIAGNOSIS — E1110 Type 2 diabetes mellitus with ketoacidosis without coma: Secondary | ICD-10-CM | POA: Diagnosis not present

## 2023-08-24 DIAGNOSIS — Z794 Long term (current) use of insulin: Secondary | ICD-10-CM

## 2023-08-24 DIAGNOSIS — Z91141 Patient's other noncompliance with medication regimen due to financial hardship: Secondary | ICD-10-CM

## 2023-08-24 LAB — BASIC METABOLIC PANEL
Anion gap: 11 (ref 5–15)
BUN: 25 mg/dL — ABNORMAL HIGH (ref 6–20)
CO2: 26 mmol/L (ref 22–32)
Calcium: 8.8 mg/dL — ABNORMAL LOW (ref 8.9–10.3)
Chloride: 98 mmol/L (ref 98–111)
Creatinine, Ser: 1.7 mg/dL — ABNORMAL HIGH (ref 0.44–1.00)
GFR, Estimated: 34 mL/min — ABNORMAL LOW (ref >60)
Glucose, Bld: 357 mg/dL — ABNORMAL HIGH (ref 70–99)
Potassium: 3.5 mmol/L (ref 3.5–5.1)
Sodium: 135 mmol/L (ref 135–145)

## 2023-08-24 LAB — URINE CULTURE

## 2023-08-24 LAB — CBC
HCT: 34 % — ABNORMAL LOW (ref 36.0–46.0)
Hemoglobin: 12 g/dL (ref 12.0–15.0)
MCH: 28.2 pg (ref 26.0–34.0)
MCHC: 35.3 g/dL (ref 30.0–36.0)
MCV: 80 fL (ref 80.0–100.0)
Platelets: 340 10*3/uL (ref 150–400)
RBC: 4.25 MIL/uL (ref 3.87–5.11)
RDW: 13.4 % (ref 11.5–15.5)
WBC: 7.2 10*3/uL (ref 4.0–10.5)
nRBC: 0 % (ref 0.0–0.2)

## 2023-08-24 LAB — GLUCOSE, CAPILLARY
Glucose-Capillary: 390 mg/dL — ABNORMAL HIGH (ref 70–99)
Glucose-Capillary: 369 mg/dL — ABNORMAL HIGH (ref 70–99)
Glucose-Capillary: 277 mg/dL — ABNORMAL HIGH (ref 70–99)
Glucose-Capillary: 133 mg/dL — ABNORMAL HIGH (ref 70–99)
Glucose-Capillary: 155 mg/dL — ABNORMAL HIGH (ref 70–99)

## 2023-08-24 LAB — MAGNESIUM
Magnesium: 2.6 mg/dL — ABNORMAL HIGH (ref 1.7–2.4)
Magnesium: 2.7 mg/dL — ABNORMAL HIGH (ref 1.7–2.4)

## 2023-08-24 LAB — PHOSPHORUS: Phosphorus: 3.5 mg/dL (ref 2.5–4.6)

## 2023-08-24 LAB — HEPATIC FUNCTION PANEL
ALT: 9 U/L (ref 0–44)
AST: 14 U/L — ABNORMAL LOW (ref 15–41)
Albumin: 3 g/dL — ABNORMAL LOW (ref 3.5–5.0)
Alkaline Phosphatase: 73 U/L (ref 38–126)
Bilirubin, Direct: 0.2 mg/dL (ref 0.0–0.2)
Indirect Bilirubin: 0.3 mg/dL (ref 0.3–0.9)
Total Bilirubin: 0.5 mg/dL (ref <1.2)
Total Protein: 6.7 g/dL (ref 6.5–8.1)

## 2023-08-24 MED ORDER — DEXTROSE 5 % IV SOLN
30.0000 mmol | Freq: Once | INTRAVENOUS | Status: AC
  Administered 2023-08-24: 30 mmol via INTRAVENOUS
  Filled 2023-08-24: qty 10

## 2023-08-24 MED ORDER — INSULIN GLARGINE-YFGN 100 UNIT/ML ~~LOC~~ SOLN
15.0000 [IU] | Freq: Once | SUBCUTANEOUS | Status: AC
  Administered 2023-08-24: 15 [IU] via SUBCUTANEOUS
  Filled 2023-08-24: qty 0.15

## 2023-08-24 MED ORDER — K PHOS MONO-SOD PHOS DI & MONO 155-852-130 MG PO TABS
500.0000 mg | ORAL_TABLET | Freq: Four times a day (QID) | ORAL | Status: DC
  Administered 2023-08-24 (×5): 500 mg via ORAL
  Filled 2023-08-24 (×5): qty 2

## 2023-08-24 MED ORDER — POTASSIUM CHLORIDE 10 MEQ/100ML IV SOLN
10.0000 meq | INTRAVENOUS | Status: AC
  Administered 2023-08-24 (×2): 10 meq via INTRAVENOUS
  Filled 2023-08-24 (×2): qty 100

## 2023-08-24 MED ORDER — INSULIN GLARGINE-YFGN 100 UNIT/ML ~~LOC~~ SOLN
30.0000 [IU] | Freq: Every day | SUBCUTANEOUS | Status: DC
  Administered 2023-08-25: 30 [IU] via SUBCUTANEOUS
  Filled 2023-08-24 (×2): qty 0.3

## 2023-08-24 MED ORDER — POTASSIUM CHLORIDE CRYS ER 20 MEQ PO TBCR
40.0000 meq | EXTENDED_RELEASE_TABLET | Freq: Four times a day (QID) | ORAL | Status: AC
  Administered 2023-08-24 (×2): 40 meq via ORAL
  Filled 2023-08-24 (×2): qty 2

## 2023-08-24 MED ORDER — METFORMIN HCL 500 MG PO TABS
500.0000 mg | ORAL_TABLET | Freq: Every day | ORAL | Status: DC
  Administered 2023-08-24 – 2023-08-25 (×2): 500 mg via ORAL
  Filled 2023-08-24 (×2): qty 1

## 2023-08-24 MED ORDER — INSULIN ASPART 100 UNIT/ML IJ SOLN
4.0000 [IU] | Freq: Three times a day (TID) | INTRAMUSCULAR | Status: DC
  Administered 2023-08-24 – 2023-08-26 (×7): 4 [IU] via SUBCUTANEOUS

## 2023-08-24 NOTE — Inpatient Diabetes Management (Addendum)
Inpatient Diabetes Program Recommendations  AACE/ADA: New Consensus Statement on Inpatient Glycemic Control (2015)  Target Ranges:  Prepandial:   less than 140 mg/dL      Peak postprandial:   less than 180 mg/dL (1-2 hours)      Critically ill patients:  140 - 180 mg/dL   Lab Results  Component Value Date   GLUCAP 390 (H) 08/24/2023   HGBA1C 9.5 (A) 07/31/2023    Latest Reference Range & Units 08/23/23 13:39 08/23/23 15:12 08/23/23 16:19 08/23/23 17:27 08/23/23 18:27 08/23/23 19:37 08/23/23 21:53 08/24/23 07:46  Glucose-Capillary 70 - 99 mg/dL 409 (H) 811 (H) 914 (H) 133 (H) 190 (H) 222 (H) 251 (H) 390 (H)  (H): Data is abnormally high  Diabetes history: DM2 Outpatient Diabetes medications: Lantus 40 bid, Humalog 12 tid, Ozempic 0.5 mg weekly  Current orders for Inpatient glycemic control: Semglee 15 units daily, Novolog 0-15 units tid, 0-5 hs  Inpatient Diabetes Program Recommendations:   Please consider: -Increase Semglee to 30 units daily (add additional 15 units today) -Add Novolog 4 units tid meal coverage if eats 50%  11:30 am Spoke with patient and 2 daughters @ bedside. Patient lives with one daughter that leaves for work 7 am and returns home around 8 pm seven days weekly. States patient does not eat well unless daughter is home. Daughter inquired about an insulin pump and will require a referral to endocrinologist. Uncertain if pt. Able to comprehend use of insulin pump. Discussed sensor but patient unable to know how to treat if needed. Consult to transition of care for options of home health or other resources. Discussed via phone with Dr. Randol Kern to give update.  Thank you, Barbara Wang. Barbara Cupit, RN, MSN, CDCES  Diabetes Coordinator Inpatient Glycemic Control Team Team Pager [phone] (8am-5pm) 08/24/2023 10:44 AM

## 2023-08-24 NOTE — Progress Notes (Signed)
NEUROLOGY CONSULT FOLLOW UP NOTE   Date of service: August 24, 2023 Patient Name: Barbara Wang MRN:  [id number] DOB:  [birth year]  Brief Review of HPI  Barbara Wang is a 57 y.o. female with a PMHx of asthma, hypertension, hyperlipidemia, diabetes, prior history of DKA's, planum sphenoidale meningioma, who presented to the emergency department for evaluation of altered mental status and jerking movements on the right side of the body.  Last known well reported to be the night prior to presentation maybe before that when they started noticing that she was just generally feeling weak.  The morning of 08/22/2023, the daughter left for work without seeing the patient awake.  When she returned back after long 12 to 14-hour shift, she did not really go inside the house but had her child report that the patient was awake at some point during the day but was not making much sense in terms of her speech and appeared confused.  When the daughter saw the patient she noted that her right arm had twitching and jerking movements, as well as possibly her legs.  She was brought in for evaluation in the hospital.  On initial labs, her sugar was greater than 700 on the glucometer.  Lab value of the glucose was 1008. Patient takes her medications herself. She admits to having been poorly compliant with her insulin and had missed all of her doses over the prior 2 days due to having run out and not being able to afford new vials of insulin. Also may not have been very compliant overall due to multiple issues-some cognitive decline also being 1 of those issues-this is a sense that neurology team got from speaking with the daughter who lives with her.     Interval Hx/subjective  The patient is now awake and alert. She does not remember the events of the past 2 days. She has no complaints. Both daughters are at bedside this morning.   Vitals   Vitals:   08/24/23 0110 08/24/23 0424 08/24/23 0500 08/24/23  0747  BP:   120/71 (!) 141/79  Pulse:   97 (!) 101  Resp:   20 19  Temp: 98.7 F (37.1 C)  98 F (36.7 C) 98.3 F (36.8 C)  TempSrc: Oral Oral Oral Oral  SpO2:    100%     There is no height or weight on file to calculate BMI.  Physical Exam   Physical Exam HEENT- [place]/AT   Lungs- Respirations unlabored Extremities- Warm and well-perfused  Neurological Examination Mental Status: Awake and alert. Fully oriented x 5. Thought content appropriate.  Speech fluent without evidence of aphasia.  Able to follow all commands without difficulty. Cheerful affect.  Cranial Nerves: II: Temporal visual fields intact with no extinction to DSS. PERRL. III,IV, VI: No ptosis. EOMI but with saccadic quality of visual pursuits. No nystagmus. VII: Smile symmetric VIII: Hearing intact to voice IX,X: No hypophonia or hoarseness XI: Symmetric XII: No lingual dysarthria.  Motor: RUE: 5/5 LUE: 5/5 RLE: 5/5 LLE: 5/5 No jerking, twitching or other adventitious movements noted.  Sensory: Gross touch intact x 4.  Cerebellar: No ataxia with FNF bilaterally Gait: Deferred   Medications  Current Facility-Administered Medications:    0.45 % sodium chloride infusion, , Intravenous, Continuous, Osvaldo Shipper, MD, Last Rate: 100 mL/hr at 08/24/23 0618, New Bag at 08/24/23 0618   acetaminophen (TYLENOL) tablet 650 mg, 650 mg, Oral, Q6H PRN, 650 mg at 08/24/23 0703 **OR** acetaminophen (TYLENOL) suppository 650 mg,  650 mg, Rectal, Q6H PRN, John Giovanni, MD   cefTRIAXone (ROCEPHIN) 1 g in sodium chloride 0.9 % 100 mL IVPB, 1 g, Intravenous, Q24H, Osvaldo Shipper, MD, Stopped at 08/23/23 1730   dextrose 50 % solution 0-50 mL, 0-50 mL, Intravenous, PRN, John Giovanni, MD   heparin injection 5,000 Units, 5,000 Units, Subcutaneous, Q8H, Rathore, Ulyess Blossom, MD, 5,000 Units at 08/24/23 0540   insulin aspart (novoLOG) injection 0-15 Units, 0-15 Units, Subcutaneous, TID WC, Osvaldo Shipper, MD, 15 Units  at 08/24/23 0857   insulin aspart (novoLOG) injection 0-5 Units, 0-5 Units, Subcutaneous, QHS, Osvaldo Shipper, MD, 3 Units at 08/23/23 2206   insulin glargine-yfgn (SEMGLEE) injection 15 Units, 15 Units, Subcutaneous, Daily, Osvaldo Shipper, MD, 15 Units at 08/24/23 0858   phosphorus (K PHOS NEUTRAL) tablet 500 mg, 500 mg, Oral, QID, Elgergawy, Leana Roe, MD, 500 mg at 08/24/23 0857   potassium chloride 10 mEq in 100 mL IVPB, 10 mEq, Intravenous, Q1 Hr x 2, Elgergawy, Leana Roe, MD, Last Rate: 100 mL/hr at 08/24/23 0859, 10 mEq at 08/24/23 0859   potassium chloride SA (KLOR-CON M) CR tablet 40 mEq, 40 mEq, Oral, Q6H, Elgergawy, Leana Roe, MD, 40 mEq at 08/24/23 0857  Labs and Diagnostic Imaging   CBC:  Recent Labs  Lab 08/23/23 1528 08/24/23 0513  WBC 9.1 7.2  HGB 12.5 12.0  HCT 36.0 34.0*  MCV 79.8* 80.0  PLT 469* 340    Basic Metabolic Panel:  Lab Results  Component Value Date   NA 136 08/23/2023   K 2.9 (L) 08/23/2023   CO2 27 08/23/2023   GLUCOSE 240 (H) 08/23/2023   BUN 26 (H) 08/23/2023   CREATININE 1.92 (H) 08/23/2023   CALCIUM 9.1 08/23/2023   GFRNONAA 30 (L) 08/23/2023   GFRAA >60 01/17/2020   Lipid Panel:  Lab Results  Component Value Date   LDLCALC 54 07/31/2023   HgbA1c:  Lab Results  Component Value Date   HGBA1C 9.5 (A) 07/31/2023   Urine Drug Screen:     Component Value Date/Time   LABOPIA NONE DETECTED 08/23/2023 1341   COCAINSCRNUR NONE DETECTED 08/23/2023 1341   LABBENZ NONE DETECTED 08/23/2023 1341   AMPHETMU NONE DETECTED 08/23/2023 1341   THCU NONE DETECTED 08/23/2023 1341   LABBARB NONE DETECTED 08/23/2023 1341    Alcohol Level     Component Value Date/Time   ETH <10 01/17/2020 2012   INR  Lab Results  Component Value Date   INR 1.0 01/17/2020   APTT  Lab Results  Component Value Date   APTT 23 (L) 01/17/2020    LTM EEG (08/24/2023 0427 to  08/24/2023 0818):  This study was within normal limits. No seizures or definite  epileptiform discharges were noted.   Assessment  57 y.o. female with a PMHx of asthma, hypertension, hyperlipidemia, diabetes, prior history of DKA's, planum sphenoidale meningioma, who presented to the emergency department for evaluation of altered mental status and jerking movements on the right side of the body. Sugars were noted to be greater than 1000. She now reveals that she had not taken any insulin for 2 days prior to presenting, due to running out of her insulin and not being able to purchase more due to financial issues.  - On exam today, she is back to baseline. No jerking, twitching or other adventitious movements noted.   - Overnight LTM EEG is normal - Overall impression: - Myoclonus in the setting of severe hyperglycemia - Poor compliance with insulin - Poor health  care literacy, including her level of knowledge regarding home diabetes management  - Possible financial barriers to maintaining compliance with medications at home  Recommendations  Can cancel MRI brain. No focality seen on exam now that she is back to baseline.  Discontinuing LTM EEG Consider Case Management consult due to patient having possible financial barriers to maintaining compliance with medications at home Management of hyperglycemia, diabetes education and renal dysfunction per primary team Neurohospitalist service will sign off. Please call if there are additional questions.  ______________________________________________________________________   Dessa Phi, Jayse Hodkinson, MD [facility]

## 2023-08-24 NOTE — Progress Notes (Signed)
Patient is alert, awake, DC insulin drip per ordered . Bladder scanned results 56 mls. Patient has 400 mls urine  out put and  1 saturated chuck , 1 bowel movement. RAC IV removed by PT. Replaced new IV on LFA.

## 2023-08-24 NOTE — Progress Notes (Signed)
Speech Language Pathology Treatment: Dysphagia  Patient Details Name: Barbara Wang MRN: [id number] DOB: [birth year] Today's Date: 08/24/2023 Time: 0865-7846 SLP Time Calculation (min) (ACUTE ONLY): 10 min  Assessment / Plan / Recommendation Clinical Impression  Pt seen for potential diet texture upgrade after assessment yesterday. Pt's mentation has significantly improved and she was alert and appropriate throughout session. She demonstrated functional mastication over multiple trials with graham cracker solids without residue. She also consumed thin via straw without s/s aspiration with sequential sips. SLP upgraded texture to regular, continue thin liquids, pills with thin and no further ST is warranted.    HPI HPI: Barbara Wang is a 57 yo female presenting to ED 12/25 with AMS, hyperglycemia, and R sided extremity jerking movements. Admitted with DKA and acute encephalopathy as well as LTM to rule out seizures. PMH includes T2DM, HTN, HLD, asthma, meningioma, depression, pancreatitis, chronic bronchitis, GERD, anemia      SLP Plan  All goals met      Recommendations for follow up therapy are one component of a multi-disciplinary discharge planning process, led by the attending physician.  Recommendations may be updated based on patient status, additional functional criteria and insurance authorization.    Recommendations  Diet recommendations: Regular;Thin liquid Liquids provided via: Straw;Cup Medication Administration: Whole meds with liquid Supervision: Patient able to self feed Compensations: Slow rate;Small sips/bites Postural Changes and/or Swallow Maneuvers: Seated upright 90 degrees                  Oral care BID   None Dysphagia, unspecified (R13.10)     All goals met     Royce Macadamia  08/24/2023, 3:14 PM

## 2023-08-24 NOTE — Progress Notes (Signed)
vLTM discontinued  No skin breakdown noted at all skin sites  [facility] notified

## 2023-08-24 NOTE — Progress Notes (Addendum)
[facility] PROGRESS NOTE   Barbara Wang ZOX:[id number] DOB: 1966/04/07 DOA: 08/22/2023  PCP: Claiborne Rigg, NP  Brief History:   57 y.o. female with medical history significant of insulin-dependent type 2 diabetes, hypertension, hyperlipidemia, asthma, meningioma, depression, pancreatitis, obesity, chronic bronchitis, GERD, anemia presented to the ED via EMS for evaluation of altered mental status, hyperglycemia, and jerking movements of the right side of the body.  She was found to have diabetic ketoacidosis.  There was concern for seizure activity.  Neurology was consulted.  Patient was hospitalized.   Consultants: Neurology  Procedures: LTM    Subjective/Interval History:  Patient very lethargic.  Arouses but goes right back to sleep.  Follows simple commands.  Daughters are at the bedside.   Assessment/Plan:  Diabetic ketoacidosis/insulin-dependent diabetes mellitus type 2 HbA1c was 9.5 on 07/31/2023.  Suspect noncompliance at home. Came in with anion gap of 32.  Started on insulin infusion.  Started on IV fluids.   -Gap has closed, she was transition to acute insulin . -CBG is elevated this morning, will increase her Semglee from 15 to 30 units, continue with insulin sliding scale, patient with poor compliance with insulin at home, will try to change to once daily, no family support at home to keep on sliding scale, so we will add low-dose metformin as discussed with diabetic coordinator, will adjust insulin dose as needed  UTI  -continue with IV Rocephin, follow urine cultures   Acute kidney injury Baseline creatinine seems to be around 1.2 or so.  She was on lisinopril and HCTZ at home which is currently on hold. AKI thought to be due to severe dehydration.  Came in with creatinine of 2.9.  Being aggressively hydrated.  Monitor urine output.  Bladder scans.  Continue to monitor labs.  Creatinine improving, it is 1.7 today.  Myoclonic jerking of the  right upper and lower extremity Neurology has seen the patient.  On LTM EEG, which was normal, her [facility] twitching felt secondary to severe hyperglycemia -MRI is pending  Normocytic anemia Significant drop in hemoglobin is noted this morning at 9.1.  Was 14.4 overnight.  No overt bleeding has been noted.  Could be an erroneous read.  Will repeat labs.  Acute metabolic encephalopathy Secondary to DKA.  Currently able to protect her airway.  Monitor closely.  N.p.o. status.  QT prolongation Monitor on telemetry.  Hyperbilirubinemia Continue to monitor trends.  Hypokalemia Hypomagnesemia Hypophosphatemia -Replaced, continue to monitor  History of essential hypertension Monitor blood pressures closely.  All of her home medications are currently on hold.  Obesity Estimated body mass index is 32.61 kg/m as calculated from the following:   Height as of 07/31/23: 5\' 4"  (1.626 m).   Weight as of 07/31/23: 86.2 kg.   DVT Prophylaxis: Subcutaneous heparin Code Status: Full code Family Communication: Irving Burton members were present at bedside, but they were sleeping Disposition Plan: To be determined  Status is: Inpatient The patient will require care spanning > 2 midnights and should be moved to inpatient because: DKA, encephalopathy      Medications: Scheduled:  heparin  5,000 Units Subcutaneous Q8H   insulin aspart  0-15 Units Subcutaneous TID WC   insulin aspart  0-5 Units Subcutaneous QHS   insulin aspart  4 Units Subcutaneous TID WC   [START ON 08/25/2023] insulin glargine-yfgn  30 Units Subcutaneous Daily   metFORMIN  500 mg Oral Q breakfast   phosphorus  500 mg Oral QID   potassium chloride  40 mEq Oral Q6H   Continuous:  sodium chloride 100 mL/hr at 08/24/23 1100   cefTRIAXone (ROCEPHIN)  IV Stopped (08/23/23 1730)   [id number] **OR** acetaminophen, dextrose  Antibiotics: Anti-infectives (From admission, onward)    Start     Dose/Rate Route Frequency  Ordered Stop   08/23/23 1715  cefTRIAXone (ROCEPHIN) 1 g in sodium chloride 0.9 % 100 mL IVPB        1 g 200 mL/hr over 30 Minutes Intravenous Every 24 hours 08/23/23 1626         Objective:  Vital Signs  Vitals:   08/24/23 0424 08/24/23 0500 08/24/23 0747 08/24/23 1230  BP:  120/71 (!) 141/79 111/69  Pulse:  97 (!) 101 83  Resp:  20 19 20   Temp:  98 F (36.7 C) 98.3 F (36.8 C) 98.2 F (36.8 C)  TempSrc: Oral Oral Oral Oral  SpO2:   100%     Intake/Output Summary (Last 24 hours) at 08/24/2023 1341 Last data filed at 08/24/2023 1122 Gross per 24 hour  Intake 2123.31 ml  Output 1401 ml  Net 722.31 ml   There were no vitals filed for this visit.   Somnolent, but wakes up answers some questions, but she is confused, frail, chronically ill appearing and appears older than stated age, significantly deconditioned Symmetrical Chest wall movement, Good air movement bilaterally, CTAB RRR,No Gallops,Rubs or new Murmurs, No Parasternal Heave +ve B.Sounds, Abd Soft, No tenderness, No rebound - guarding or rigidity. No Cyanosis, Clubbing or edema, No new Rash or bruise     Lab Results:  Data Reviewed: I have personally reviewed following labs and reports of the imaging studies  CBC: Recent Labs  Lab 08/23/23 0004 08/23/23 0012 08/23/23 0013 08/23/23 0456 08/23/23 1528 08/24/23 0513  WBC 9.2  --   --  5.8 9.1 7.2  HGB 14.4 15.0 15.3* 9.1* 12.5 12.0  HCT 42.1 44.0 45.0 26.1* 36.0 34.0*  MCV 81.7  --   --  79.8* 79.8* 80.0  PLT 533*  --   --  331 469* 340    Basic Metabolic Panel: Recent Labs  Lab 08/23/23 0004 08/23/23 0012 08/23/23 0013 08/23/23 0228 08/23/23 1111 08/23/23 1528 08/23/23 2028 08/24/23 0805 08/24/23 0808  NA 128*   < > 128* 129*  --  140 136 135  --   K 4.2   < > 3.8 4.5  --  2.5* 2.9* 3.5  --   CL 82*  --  86* 79*  --  102 99 98  --   CO2 14*  --   --  14*  --  27 27 26   --   GLUCOSE 1,008*  --  >700* 968*  --  102* 240* 357*  --   BUN  39*  --  37* 39*  --  31* 26* 25*  --   CREATININE 2.94*  --  2.30* 2.93*  --  1.97* 1.92* 1.70*  --   CALCIUM 10.2  --   --  10.2  --  9.7 9.1 8.8*  --   MG  --   --   --   --  1.0*  --   --  2.7* 2.6*  PHOS  --   --   --   --   --   --  <1.0* 3.5  --    < > = values in this interval not displayed.    GFR: CrCl cannot be calculated (Unknown ideal weight.).  Liver Function  Tests: Recent Labs  Lab 08/23/23 0004 08/24/23 0805  AST 13* 14*  ALT 14 9  ALKPHOS 105 73  BILITOT 2.1* 0.5  PROT 8.1 6.7  ALBUMIN 3.9 3.0*     CBG: Recent Labs  Lab 08/23/23 1827 08/23/23 1937 08/23/23 2153 08/24/23 0746 08/24/23 1234  GLUCAP 190* 222* 251* 390* 369*     Radiology Studies: DG Abd Portable 1V Result Date: 08/23/2023 CLINICAL DATA:  57 year old female with left abdominal pain. EXAM: PORTABLE ABDOMEN - 1 VIEW COMPARISON:  CT Abdomen and Pelvis 11/30/2019. FINDINGS: Portable AP supine views at 0857 hours. Lung bases appear clear. Stable cholecystectomy clips. Non obstructed bowel gas pattern. No pneumoperitoneum evident on these supine views. Other abdominal visceral contours appear stable. Incidental pelvic phleboliths. No acute osseous abnormality identified. IMPRESSION: Negative bowel gas pattern. Electronically Signed   By: Odessa Fleming M.D.   On: 08/23/2023 09:56   Overnight EEG with video Result Date: 08/23/2023 Charlsie Quest, MD     08/24/2023  8:03 AM Patient Name: Barbara Wang MRN: [id number] Epilepsy Attending: Charlsie Quest Referring Physician/Provider: Milon Dikes, MD Duration: 12/26/ 2024 0427 to  08/24/2023 0427 Patient history: 57 yo F presented with altered mental status and jerking movements on the right side of the body. EEG to evaluate for seizure Level of alertness:  lethargic --->awake, asleep AEDs during EEG study: None Technical aspects: This EEG study was done with scalp electrodes positioned according to the 10-20 International system of electrode  placement. Electrical activity was reviewed with band pass filter of 1-70Hz , sensitivity of 7 uV/mm, display speed of 44mm/sec with a 60Hz  notched filter applied as appropriate. EEG data were recorded continuously and digitally stored.  Video monitoring was available and reviewed as appropriate. Description: EEG initially showed continuous generalized predominantly 5-9Hz  theta-alpha activity admixed with intermittent 2-3 Hz delta slowing. Gradually EEG improved and showed posterior dominant rhythm of 8 Hz activity of moderate voltage (25-35 uV) seen predominantly in posterior head regions, symmetric and reactive to eye opening and eye closing. Sleep was characterized by vertex waves, sleep spindles (12 to 14 Hz), maximal frontocentral region. Hyperventilation and photic stimulation were not performed.   ABNORMALITY - Continuous slow, generalized IMPRESSION: This study was initially suggestive of moderate diffuse encephalopathy. Gradually EEG improved and was within normal limits. No seizure or definite epileptiform discharges were noted. Charlsie Quest   DG Chest Port 1 View Result Date: 08/23/2023 CLINICAL DATA:  Hyperglycemia, altered mental status. EXAM: PORTABLE CHEST 1 VIEW COMPARISON:  July 16, 2023 FINDINGS: The heart size and mediastinal contours are within normal limits. There is no evidence of an acute infiltrate, pleural effusion or pneumothorax. Multilevel degenerative changes seen throughout the thoracic spine. IMPRESSION: No active cardiopulmonary disease. Electronically Signed   By: Aram Candela M.D.   On: 08/23/2023 00:58   CT Head Wo Contrast Result Date: 08/23/2023 CLINICAL DATA:  Altered mental status EXAM: CT HEAD WITHOUT CONTRAST TECHNIQUE: Contiguous axial images were obtained from the base of the skull through the vertex without intravenous contrast. RADIATION DOSE REDUCTION: This exam was performed according to the departmental dose-optimization program which includes  automated exposure control, adjustment of the mA and/or kV according to patient size and/or use of iterative reconstruction technique. COMPARISON:  July 16, 2023 FINDINGS: Brain: There is mild cerebral atrophy with widening of the extra-axial spaces and ventricular dilatation. There are areas of decreased attenuation within the white matter tracts of the supratentorial brain, consistent with microvascular disease changes. A  chronic right lacunar infarct is again seen. A stable 2.8 cm x 2.1 cm suprasellar mass is seen (best seen on coronal reformatted image 41, CT series 5/sagittal reformatted image 31, CT series 6). Vascular: No hyperdense vessel or unexpected calcification. Skull: Normal. Negative for fracture or focal lesion. Sinuses/Orbits: No acute finding. Other: None. IMPRESSION: 1. Generalized cerebral atrophy and microvascular disease changes of the supratentorial brain. 2. Stable suprasellar meningioma, confirmed on prior MRI valuation. 3. Chronic right lacunar infarct. 4. No acute intracranial abnormality. Electronically Signed   By: Aram Candela M.D.   On: 08/23/2023 00:55       LOS: 1 day   Neely Cecena  [facility] Pager on [email or web address]  08/24/2023, 1:41 PM

## 2023-08-24 NOTE — Progress Notes (Signed)
Mobility Specialist Progress Note:    08/24/23 1400  Mobility  Activity Ambulated with assistance in hallway  Level of Assistance Contact guard assist, steadying assist  Assistive Device Front wheel walker  Distance Ambulated (ft) 80 ft  Activity Response Tolerated well  Mobility Referral Yes  Mobility visit 1 Mobility  Mobility Specialist Start Time (ACUTE ONLY) 1400  Mobility Specialist Stop Time (ACUTE ONLY) 1410  Mobility Specialist Time Calculation (min) (ACUTE ONLY) 10 min   Pt received in bed, agreeable to mobility session. Ambulated in hallway with CGA via RW. Tolerated well, asx throughout. Returned pt to room, left with all needs met.    Feliciana Rossetti Mobility Specialist Please contact via Special educational needs teacher or  Rehab office at [phone]

## 2023-08-24 NOTE — Procedures (Addendum)
Patient Name: RHEANN BOEHNLEIN  MRN: [id number]  Epilepsy Attending: Charlsie Quest  Referring Physician/Provider: Milon Dikes, MD  Duration: 08/24/2023 0427 to  08/24/2023 0818   Patient history: 57 yo F presented with altered mental status and jerking movements on the right side of the body. EEG to evaluate for seizure   Level of alertness: awake, asleep   AEDs during EEG study: None   Technical aspects: This EEG study was done with scalp electrodes positioned according to the 10-20 International system of electrode placement. Electrical activity was reviewed with band pass filter of 1-70Hz , sensitivity of 7 uV/mm, display speed of 41mm/sec with a 60Hz  notched filter applied as appropriate. EEG data were recorded continuously and digitally stored.  Video monitoring was available and reviewed as appropriate.   Description: The posterior dominant rhythm consists of 8 Hz activity of moderate voltage (25-35 uV) seen predominantly in posterior head regions, symmetric and reactive to eye opening and eye closing. Sleep was characterized by vertex waves, sleep spindles (12 to 14 Hz), maximal frontocentral region. Hyperventilation and photic stimulation were not performed.      IMPRESSION: This study was within normal limits. No seizure or definite epileptiform discharges were noted.   Shruthi Northrup Annabelle Harman

## 2023-08-24 NOTE — Evaluation (Signed)
Physical Therapy Evaluation Patient Details Name: Barbara Wang MRN: [id number] DOB: Jan 15, 1966 Today's Date: 08/24/2023  History of Present Illness  57 y.o. female presented to the ED via EMS for evaluation of altered mental status, hyperglycemia, and jerking movements of the right side of the body.  She was found to have diabetic ketoacidosis. Past medical history significant of insulin-dependent type 2 diabetes, hypertension, hyperlipidemia, asthma, meningioma, depression, pancreatitis, obesity, chronic bronchitis, GERD, anemia.  Clinical Impression  Pt presents with admitting diagnosis above. Pt today was able to ambulate in the hallway with RW at supervision level. PTA pt reports that she lives on the second floor of an apartment with her granddaughter and daughter and was Mod I [place] however does occasionally need assistance. Recommend HHPT upon DC. Patient needs to practice stairs next session.           If plan is discharge home, recommend the following: A little help with walking and/or transfers;A little help with bathing/dressing/bathroom;Assistance with cooking/housework;Direct supervision/assist for medications management;Assist for transportation;Help with stairs or ramp for entrance;Supervision due to cognitive status   Can travel by private vehicle        Equipment Recommendations None recommended by PT  Recommendations for Other Services       Functional Status Assessment Patient has had a recent decline in their functional status and demonstrates the ability to make significant improvements in function in a reasonable and predictable amount of time.     Precautions / Restrictions Precautions Precautions: Fall Restrictions Weight Bearing Restrictions Per Provider Order: No      Mobility  Bed Mobility Overal bed mobility: Needs Assistance Bed Mobility: Supine to Sit, Sit to Supine     Supine to sit: Supervision Sit to supine: Supervision         Transfers Overall transfer level: Needs assistance Equipment used: Rolling walker (2 wheels) Transfers: Sit to/from Stand Sit to Stand: Contact guard assist           General transfer comment: Cues for hand placement. Increased time due to slow processing.    Ambulation/Gait Ambulation/Gait assistance: Supervision Gait Distance (Feet): 100 Feet Assistive device: Rolling walker (2 wheels) Gait Pattern/deviations: Narrow base of support, Decreased stride length, Step-through pattern Gait velocity: decreased     General Gait Details: Pt with very slow step through pattern. no LOB noted. Pt also noted with very narrow gait pattern.  Stairs            Wheelchair Mobility     Tilt Bed    Modified Rankin (Stroke Patients Only)       Balance Overall balance assessment: Needs assistance Sitting-balance support: Bilateral upper extremity supported, Feet supported Sitting balance-Leahy Scale: Good     Standing balance support: Bilateral upper extremity supported, During functional activity Standing balance-Leahy Scale: Poor Standing balance comment: Reliant on RW                             Pertinent Vitals/Pain Pain Assessment Pain Assessment: No/denies pain    Home Living Family/patient expects to be discharged to:: Private residence Living Arrangements: Children;Other relatives (Daughter and granddaughter) Available Help at Discharge: Family;Available PRN/intermittently Type of Home: Apartment Home Access: Stairs to enter Entrance Stairs-Rails: Can reach both Entrance Stairs-Number of Steps: 15   Home Layout: One level Home Equipment: Agricultural consultant (2 wheels);Cane - single point;Hand held shower head      Prior Function Prior Level of Function : Independent/Modified Independent;History  of Falls (last six months)             Mobility Comments: Mod I [facility] however pt states that sometimes she needs help ADLs Comments: Ind      Extremity/Trunk Assessment   Upper Extremity Assessment Upper Extremity Assessment: Generalized weakness    Lower Extremity Assessment Lower Extremity Assessment: Generalized weakness    Cervical / Trunk Assessment Cervical / Trunk Assessment: Normal  Communication   Communication Communication: No apparent difficulties  Cognition Arousal: Alert Behavior During Therapy: WFL for tasks assessed/performed Overall Cognitive Status: Impaired/Different from baseline Area of Impairment: Problem solving, Following commands                       Following Commands: Follows one step commands with increased time, Follows multi-step commands with increased time     Problem Solving: Slow processing, Decreased initiation, Requires verbal cues, Requires tactile cues General Comments: Pt A&Ox4 however noted to have slow proccessing.        General Comments General comments (skin integrity, edema, etc.): VSS    Exercises     Assessment/Plan    PT Assessment Patient needs continued PT services  PT Problem List Decreased strength;Decreased range of motion;Decreased activity tolerance;Decreased mobility;Decreased balance;Decreased coordination;Decreased knowledge of use of DME;Decreased cognition;Decreased safety awareness;Decreased knowledge of precautions;Cardiopulmonary status limiting activity       PT Treatment Interventions DME instruction;Gait training;Stair training;Functional mobility training;Therapeutic activities;Therapeutic exercise;Balance training;Neuromuscular re-education;Cognitive remediation;Patient/family education    PT Goals (Current goals can be found in the Care Plan section)  Acute Rehab PT Goals Patient Stated Goal: to get home PT Goal Formulation: With patient Time For Goal Achievement: 09/07/23 Potential to Achieve Goals: Good    Frequency Min 1X/week     Co-evaluation               AM-PAC PT "6 Clicks" Mobility  Outcome Measure  Help needed turning from your back to your side while in a flat bed without using bedrails?: A Little Help needed moving from lying on your back to sitting on the side of a flat bed without using bedrails?: A Little Help needed moving to and from a bed to a chair (including a wheelchair)?: A Little Help needed standing up from a chair using your arms (e.g., wheelchair or bedside chair)?: A Little Help needed to walk in hospital room?: A Little Help needed climbing 3-5 steps with a railing? : A Lot 6 Click Score: 17    End of Session Equipment Utilized During Treatment: Gait belt Activity Tolerance: Patient tolerated treatment well Patient left: in bed;with call bell/phone within reach Nurse Communication: Mobility status PT Visit Diagnosis: Other abnormalities of gait and mobility (R26.89)    Time: 1610-9604 PT Time Calculation (min) (ACUTE ONLY): 31 min   Charges:   PT Evaluation $PT Eval Moderate Complexity: 1 Mod PT Treatments $Gait Training: 8-22 mins PT General Charges $$ ACUTE PT VISIT: 1 Visit         Shela Nevin, PT, DPT Acute Rehab Services [phone]   Gladys Damme 08/24/2023, 10:27 AM

## 2023-08-25 DIAGNOSIS — E1110 Type 2 diabetes mellitus with ketoacidosis without coma: Secondary | ICD-10-CM | POA: Diagnosis not present

## 2023-08-25 LAB — GLUCOSE, CAPILLARY
Glucose-Capillary: 443 mg/dL — ABNORMAL HIGH (ref 70–99)
Glucose-Capillary: 463 mg/dL — ABNORMAL HIGH (ref 70–99)
Glucose-Capillary: 224 mg/dL — ABNORMAL HIGH (ref 70–99)
Glucose-Capillary: 113 mg/dL — ABNORMAL HIGH (ref 70–99)
Glucose-Capillary: 257 mg/dL — ABNORMAL HIGH (ref 70–99)

## 2023-08-25 LAB — BASIC METABOLIC PANEL
Anion gap: 8 (ref 5–15)
BUN: 15 mg/dL (ref 6–20)
CO2: 28 mmol/L (ref 22–32)
Calcium: 8.8 mg/dL — ABNORMAL LOW (ref 8.9–10.3)
Chloride: 103 mmol/L (ref 98–111)
Creatinine, Ser: 1.26 mg/dL — ABNORMAL HIGH (ref 0.44–1.00)
GFR, Estimated: 49 mL/min — ABNORMAL LOW (ref >60)
Glucose, Bld: 318 mg/dL — ABNORMAL HIGH (ref 70–99)
Potassium: 3.1 mmol/L — ABNORMAL LOW (ref 3.5–5.1)
Sodium: 139 mmol/L (ref 135–145)

## 2023-08-25 LAB — CBC
HCT: 36.9 % (ref 36.0–46.0)
Hemoglobin: 12.4 g/dL (ref 12.0–15.0)
MCH: 27.7 pg (ref 26.0–34.0)
MCHC: 33.6 g/dL (ref 30.0–36.0)
MCV: 82.4 fL (ref 80.0–100.0)
Platelets: 352 10*3/uL (ref 150–400)
RBC: 4.48 MIL/uL (ref 3.87–5.11)
RDW: 13.4 % (ref 11.5–15.5)
WBC: 5.5 10*3/uL (ref 4.0–10.5)
nRBC: 0 % (ref 0.0–0.2)

## 2023-08-25 LAB — MAGNESIUM: Magnesium: 2.2 mg/dL (ref 1.7–2.4)

## 2023-08-25 LAB — PHOSPHORUS: Phosphorus: 4.1 mg/dL (ref 2.5–4.6)

## 2023-08-25 MED ORDER — HYDROCHLOROTHIAZIDE 25 MG PO TABS
25.0000 mg | ORAL_TABLET | Freq: Every day | ORAL | Status: DC
  Administered 2023-08-25 – 2023-08-26 (×2): 25 mg via ORAL
  Filled 2023-08-25 (×2): qty 1

## 2023-08-25 MED ORDER — TRAZODONE HCL 50 MG PO TABS
50.0000 mg | ORAL_TABLET | Freq: Once | ORAL | Status: AC
  Administered 2023-08-25: 50 mg via ORAL
  Filled 2023-08-25: qty 1

## 2023-08-25 MED ORDER — LISINOPRIL 20 MG PO TABS
20.0000 mg | ORAL_TABLET | Freq: Every day | ORAL | Status: DC
  Administered 2023-08-25 – 2023-08-26 (×2): 20 mg via ORAL
  Filled 2023-08-25 (×2): qty 1

## 2023-08-25 MED ORDER — LISINOPRIL-HYDROCHLOROTHIAZIDE 20-25 MG PO TABS: 1.0000 | ORAL_TABLET | Freq: Every day | ORAL | Status: DC

## 2023-08-25 MED ORDER — MELATONIN 5 MG PO TABS
5.0000 mg | ORAL_TABLET | Freq: Every evening | ORAL | Status: DC | PRN
  Filled 2023-08-25: qty 1

## 2023-08-25 MED ORDER — PANTOPRAZOLE SODIUM 40 MG PO TBEC
40.0000 mg | DELAYED_RELEASE_TABLET | Freq: Every day | ORAL | Status: DC
  Administered 2023-08-25 – 2023-08-26 (×2): 40 mg via ORAL
  Filled 2023-08-25 (×2): qty 1

## 2023-08-25 MED ORDER — MOMETASONE FURO-FORMOTEROL FUM 200-5 MCG/ACT IN AERO
2.0000 | INHALATION_SPRAY | Freq: Two times a day (BID) | RESPIRATORY_TRACT | Status: DC
  Administered 2023-08-25 – 2023-08-26 (×2): 2 via RESPIRATORY_TRACT
  Filled 2023-08-25: qty 8.8

## 2023-08-25 MED ORDER — CARVEDILOL 3.125 MG PO TABS: 3.1250 mg | ORAL_TABLET | Freq: Two times a day (BID) | ORAL | Status: DC

## 2023-08-25 MED ORDER — POTASSIUM CHLORIDE CRYS ER 20 MEQ PO TBCR
40.0000 meq | EXTENDED_RELEASE_TABLET | Freq: Four times a day (QID) | ORAL | Status: AC
  Administered 2023-08-25 (×2): 40 meq via ORAL
  Filled 2023-08-25 (×2): qty 2

## 2023-08-25 MED ORDER — SERTRALINE HCL 50 MG PO TABS
50.0000 mg | ORAL_TABLET | Freq: Every day | ORAL | Status: DC
  Administered 2023-08-25 – 2023-08-26 (×2): 50 mg via ORAL
  Filled 2023-08-25 (×2): qty 1

## 2023-08-25 MED ORDER — LOPERAMIDE HCL 2 MG PO CAPS
2.0000 mg | ORAL_CAPSULE | Freq: Four times a day (QID) | ORAL | Status: DC | PRN
  Administered 2023-08-25: 2 mg via ORAL
  Filled 2023-08-25: qty 1

## 2023-08-25 NOTE — Plan of Care (Signed)

## 2023-08-25 NOTE — Inpatient Diabetes Management (Signed)
Inpatient Diabetes Program Recommendations  AACE/ADA: New Consensus Statement on Inpatient Glycemic Control (2015)  Target Ranges:  Prepandial:   less than 140 mg/dL      Peak postprandial:   less than 180 mg/dL (1-2 hours)      Critically ill patients:  140 - 180 mg/dL   Lab Results  Component Value Date   GLUCAP 224 (H) 08/25/2023   HGBA1C 9.5 (A) 07/31/2023    Review of Glycemic Control  Diabetes history: DM2 Outpatient Diabetes medications: Lantus 40 BID, Humalog 12 TID, Ozempic 0.5 mg weekly Current orders for Inpatient glycemic control: Semglee 30 units daily, Novolog 0-15 TID with meals and 0-5 HS + 4 units TID  Post-prandials elevated.  Inpatient Diabetes Program Recommendations:    Consider increasing Novolog to 6 units TID with meals if eating > 50%  Follow.  Thank you. Ailene Ards, RD, LDN, CDCES Inpatient Diabetes Coordinator [phone]

## 2023-08-25 NOTE — Progress Notes (Signed)
Patient is alert, awake, ambulatory, urine out put . BP 165/94 when PT walked to restroom.

## 2023-08-25 NOTE — Progress Notes (Signed)
[facility] PROGRESS NOTE   Barbara Wang ZOX:[id number] DOB: [birth year] DOA: 08/22/2023  PCP: Claiborne Rigg, NP  Brief History:   57 y.o. female with medical history significant of insulin-dependent type 2 diabetes, hypertension, hyperlipidemia, asthma, meningioma, depression, pancreatitis, obesity, chronic bronchitis, GERD, anemia presented to the ED via EMS for evaluation of altered mental status, hyperglycemia, and jerking movements of the right side of the body.  She was found to have diabetic ketoacidosis.  There was concern for seizure activity.  Neurology was consulted.  Patient was hospitalized.   Consultants: Neurology  Procedures: LTM    Subjective/Interval History:  Patient reports diarrhea and bloating after starting metformin yesterday   Assessment/Plan:  Diabetic ketoacidosis/insulin-dependent diabetes mellitus type 2 HbA1c was 9.5 on 07/31/2023.  Suspect noncompliance at home. Came in with anion gap of 32.  Started on insulin infusion.  Started on IV fluids.   -Gap has closed, she was transition to acute insulin . -CBG has been increased to 30 units, overall she had good readings yesterday but this morning she had hyperglycemia, as she had some crackles this morning given her breakfast was not here on time, so I will hold on creasing her Semglee . - continue with insulin sliding scale, patient with poor compliance with insulin at home, will try to change to once daily, no family support at home to keep on sliding scale. -DC metformin given diarrhea and bloating   UTI  -continue with IV Rocephin, follow urine cultures   Acute kidney injury Baseline creatinine seems to be around 1.2 or so.  She was on lisinopril and HCTZ at home which is currently on hold. AKI thought to be due to severe dehydration.  Came in with creatinine of 2.9.  Being aggressively hydrated.  Monitor urine output.  Bladder scans.  Continue to monitor labs.  Creatinine  improving, it is 1.2 today.  Myoclonic jerking of the right upper and lower extremity Neurology has seen the patient.  On LTM EEG, which was normal, her [facility] twitching felt secondary to severe hyperglycemia but this has resolved this morning -No acute findings on MRI  Normocytic anemia Significant drop in hemoglobin is noted this morning at 9.1.  Was 14.4 overnight.  No overt bleeding has been noted.  Could be an erroneous read.  Pete labs reassuring  Acute metabolic encephalopathy Secondary to DKA.  Resolved  QT prolongation Monitor on telemetry.  Hyperbilirubinemia Continue to monitor trends.  Hypokalemia Hypomagnesemia Hypophosphatemia -Replaced, continue to monitor  History of essential hypertension Blood pressure started to increase, so we will resume home lisinopril-hydrochlorothiazide, Obesity Estimated body mass index is 33.15 kg/m as calculated from the following:   Height as of this encounter: 5\' 4"  (1.626 m).   Weight as of this encounter: 87.6 kg.   DVT Prophylaxis: Subcutaneous heparin Code Status: Full code Family Communication:family  members were present at bedside, but they were sleeping Disposition Plan: To be determined  Status is: Inpatient The patient will require care spanning > 2 midnights and should be moved to inpatient because: DKA, encephalopathy      Medications: Scheduled:  heparin  5,000 Units Subcutaneous Q8H   insulin aspart  0-15 Units Subcutaneous TID WC   insulin aspart  0-5 Units Subcutaneous QHS   insulin aspart  4 Units Subcutaneous TID WC   insulin glargine-yfgn  30 Units Subcutaneous Daily   Continuous:  cefTRIAXone (ROCEPHIN)  IV 1 g (08/24/23 1817)   WJX:BJYNWGNFAOZHY **OR** acetaminophen, dextrose, loperamide  Antibiotics:  Anti-infectives (From admission, onward)    Start     Dose/Rate Route Frequency Ordered Stop   08/23/23 1715  cefTRIAXone (ROCEPHIN) 1 g in sodium chloride 0.9 % 100 mL IVPB        1  g 200 mL/hr over 30 Minutes Intravenous Every 24 hours 08/23/23 1626         Objective:  Vital Signs  Vitals:   08/25/23 0019 08/25/23 0400 08/25/23 0430 08/25/23 0820  BP:  (!) 148/81  (!) 153/89  Pulse:  76 98 95  Resp:  19 (!) 26 18  Temp: 98.4 F (36.9 C) 98 F (36.7 C)  98 F (36.7 C)  TempSrc: Oral Oral  Oral  SpO2:  95% 94% 95%  Weight:      Height:        Intake/Output Summary (Last 24 hours) at 08/25/2023 1559 Last data filed at 08/25/2023 1310 Gross per 24 hour  Intake 240 ml  Output 1650 ml  Net -1410 ml   Filed Weights   08/25/23 0000  Weight: 87.6 kg     Awake Alert, Oriented X 3, more awake and appropriate today Symmetrical Chest wall movement, Good air movement bilaterally, CTAB RRR,No Gallops,Rubs or new Murmurs, No Parasternal Heave +ve B.Sounds, Abd Soft, No tenderness, No rebound - guarding or rigidity. No Cyanosis, Clubbing or edema, No new Rash or bruise     Lab Results:  Data Reviewed: I have personally reviewed following labs and reports of the imaging studies  CBC: Recent Labs  Lab 08/23/23 0004 08/23/23 0012 08/23/23 0013 08/23/23 0456 08/23/23 1528 08/24/23 0513 08/25/23 0350  WBC 9.2  --   --  5.8 9.1 7.2 5.5  HGB 14.4   < > 15.3* 9.1* 12.5 12.0 12.4  HCT 42.1   < > 45.0 26.1* 36.0 34.0* 36.9  MCV 81.7  --   --  79.8* 79.8* 80.0 82.4  PLT 533*  --   --  331 469* 340 352   < > = values in this interval not displayed.    Basic Metabolic Panel: Recent Labs  Lab 08/23/23 0228 08/23/23 1111 08/23/23 1528 08/23/23 2028 08/24/23 0805 08/24/23 0808 08/25/23 0350  NA 129*  --  140 136 135  --  139  K 4.5  --  2.5* 2.9* 3.5  --  3.1*  CL 79*  --  102 99 98  --  103  CO2 14*  --  27 27 26   --  28  GLUCOSE 968*  --  102* 240* 357*  --  318*  BUN 39*  --  31* 26* 25*  --  15  CREATININE 2.93*  --  1.97* 1.92* 1.70*  --  1.26*  CALCIUM 10.2  --  9.7 9.1 8.8*  --  8.8*  MG  --  1.0*  --   --  2.7* 2.6* 2.2  PHOS  --    --   --  <1.0* 3.5  --  4.1    GFR: Estimated Creatinine Clearance: 51.5 mL/min (A) (by C-G formula based on SCr of 1.26 mg/dL (H)).  Liver Function Tests: Recent Labs  Lab 08/23/23 0004 08/24/23 0805  AST 13* 14*  ALT 14 9  ALKPHOS 105 73  BILITOT 2.1* 0.5  PROT 8.1 6.7  ALBUMIN 3.9 3.0*     CBG: Recent Labs  Lab 08/24/23 2110 08/24/23 2128 08/25/23 0821 08/25/23 0824 08/25/23 1220  GLUCAP 155* 133* 443* 463* 224*     Radiology  Studies: MR BRAIN WO CONTRAST Result Date: 08/24/2023 CLINICAL DATA:  Altered mental status. Abnormal movements on the right side of body. EXAM: MRI HEAD WITHOUT CONTRAST TECHNIQUE: Multiplanar, multiecho pulse sequences of the brain and surrounding structures were obtained without intravenous contrast. COMPARISON:  CT head without contrast 08/23/2023. MR head without and with contrast 01/18/2020. FINDINGS: Brain: A remote hemorrhagic infarct of the right basal ganglia is new since 2021 but not acute. It measures up to 17 mm in transverse dimension. No acute infarct is present. Progressive atrophy and white matter disease is present bilaterally, asymmetric on the right. Ex vacuo dilation of the right lateral ventricle is noted. No significant extraaxial fluid collection is present. Remote lacunar infarcts are present in the inferior cerebellum bilaterally, stable. The planum sphenoidale a meningioma has slightly increased in size, now measuring 2.9 x 2.6 x 2.7 cm. This compares with previous measurements of 2.8 x 2.6 x 2.5 cm. No cavernous sinus invasion is present. 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 No other focal dural-based lesions are present. Vascular: Flow is present in the major intracranial arteries. Skull and upper cervical spine: The craniocervical junction is normal. Upper cervical spine is within normal limits. Marrow signal is unremarkable. Sinuses/Orbits: The paranasal sinuses and mastoid air cells are clear. The globes and orbits are within  normal limits. IMPRESSION: 1. No acute intracranial abnormality. 2. 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 3. Progressive atrophy and white matter disease bilaterally, asymmetric on the right. This likely reflects the sequela of chronic microvascular ischemia. 4. Remote lacunar infarcts in the inferior cerebellum bilaterally, stable. 5. Slight interval increase in size of planum sphenoidale meningioma, now measuring 2.9 x 2.6 x 2.7 cm. This compares with previous measurements of 2.8 x 2.6 x 2.5 cm. No cavernous sinus invasion is present. 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 Electronically Signed   By: Marin Roberts M.D.   On: 08/24/2023 15:00       LOS: 2 days   Jame Seelig  [facility] Pager on [email or web address]  08/25/2023, 3:59 PM

## 2023-08-25 NOTE — Progress Notes (Signed)
Physical Therapy Treatment Patient Details Name: Barbara Wang MRN: [id number] DOB: 04-20-1966 Today's Date: 08/25/2023   History of Present Illness 57 y.o. female presented to the ED via EMS for evaluation of altered mental status, hyperglycemia, and jerking movements of the right side of the body.  She was found to have diabetic ketoacidosis. Past medical history significant of insulin-dependent type 2 diabetes, hypertension, hyperlipidemia, asthma, meningioma, depression, pancreatitis, obesity, chronic bronchitis, GERD, anemia.    PT Comments  Pt tolerates treatment well, ambulating for increased distances and progressing to stair negotiation. Pt does continue to mobilize at reduced speeds and requires increased time, but appears to be improving in movement quality. Pt will continue to follow during admission.    If plan is discharge home, recommend the following: A little help with walking and/or transfers;A little help with bathing/dressing/bathroom;Assistance with cooking/housework;Direct supervision/assist for medications management;Assist for transportation;Help with stairs or ramp for entrance;Supervision due to cognitive status   Can travel by private vehicle        Equipment Recommendations  None recommended by PT    Recommendations for Other Services       Precautions / Restrictions Precautions Precautions: Fall Restrictions Weight Bearing Restrictions Per Provider Order: No     Mobility  Bed Mobility Overal bed mobility: Modified Independent Bed Mobility: Supine to Sit, Sit to Supine     Supine to sit: Modified independent (Device/Increase time), HOB elevated Sit to supine: Modified independent (Device/Increase time)        Transfers Overall transfer level: Needs assistance Equipment used: Rolling walker (2 wheels) Transfers: Sit to/from Stand Sit to Stand: Supervision                Ambulation/Gait Ambulation/Gait assistance:  Supervision Gait Distance (Feet): 150 Feet (150' x 2) Assistive device: Rolling walker (2 wheels) Gait Pattern/deviations: Step-through pattern Gait velocity: reduced Gait velocity interpretation: <1.8 ft/sec, indicate of risk for recurrent falls   General Gait Details: slowed step-through gait   Stairs Stairs: Yes Stairs assistance: Contact guard assist Stair Management: Two rails, Step to pattern, Alternating pattern (alternating pattern ascending, step-to descending) Number of Stairs: 8     Wheelchair Mobility     Tilt Bed    Modified Rankin (Stroke Patients Only)       Balance Overall balance assessment: Needs assistance Sitting-balance support: No upper extremity supported, Feet supported Sitting balance-Leahy Scale: Good     Standing balance support: Single extremity supported, Reliant on assistive device for balance Standing balance-Leahy Scale: Poor                              Cognition Arousal: Alert Behavior During Therapy: WFL for tasks assessed/performed Overall Cognitive Status: Within Functional Limits for tasks assessed                                          Exercises      General Comments General comments (skin integrity, edema, etc.): pt incontinent of liquid stool upon PT arrival, pt able to perform hygiene tasks in standing with PT setup      Pertinent Vitals/Pain Pain Assessment Pain Assessment: No/denies pain    Home Living                          Prior Function  PT Goals (current goals can now be found in the care plan section) Acute Rehab PT Goals Patient Stated Goal: to get home Progress towards PT goals: Progressing toward goals    Frequency    Min 1X/week      PT Plan      Co-evaluation              AM-PAC PT "6 Clicks" Mobility   Outcome Measure  Help needed turning from your back to your side while in a flat bed without using bedrails?: None Help  needed moving from lying on your back to sitting on the side of a flat bed without using bedrails?: None Help needed moving to and from a bed to a chair (including a wheelchair)?: A Little Help needed standing up from a chair using your arms (e.g., wheelchair or bedside chair)?: A Little Help needed to walk in hospital room?: A Little Help needed climbing 3-5 steps with a railing? : A Little 6 Click Score: 20    End of Session   Activity Tolerance: Patient tolerated treatment well Patient left: in bed;with call bell/phone within reach Nurse Communication: Mobility status PT Visit Diagnosis: Other abnormalities of gait and mobility (R26.89)     Time: 2130-8657 PT Time Calculation (min) (ACUTE ONLY): 33 min  Charges:    $Gait Training: 23-37 mins PT General Charges $$ ACUTE PT VISIT: 1 Visit                     Arlyss Gandy, PT, DPT Acute Rehabilitation Office [phone]    Arlyss Gandy 08/25/2023, 5:04 PM

## 2023-08-26 DIAGNOSIS — E1110 Type 2 diabetes mellitus with ketoacidosis without coma: Secondary | ICD-10-CM | POA: Diagnosis not present

## 2023-08-26 LAB — CBC
HCT: 35.5 % — ABNORMAL LOW (ref 36.0–46.0)
Hemoglobin: 11.9 g/dL — ABNORMAL LOW (ref 12.0–15.0)
MCH: 27.8 pg (ref 26.0–34.0)
MCHC: 33.5 g/dL (ref 30.0–36.0)
MCV: 82.9 fL (ref 80.0–100.0)
Platelets: 283 10*3/uL (ref 150–400)
RBC: 4.28 MIL/uL (ref 3.87–5.11)
RDW: 13.3 % (ref 11.5–15.5)
WBC: 6.5 10*3/uL (ref 4.0–10.5)
nRBC: 0 % (ref 0.0–0.2)

## 2023-08-26 LAB — BASIC METABOLIC PANEL
Anion gap: 10 (ref 5–15)
BUN: 15 mg/dL (ref 6–20)
CO2: 24 mmol/L (ref 22–32)
Calcium: 8.8 mg/dL — ABNORMAL LOW (ref 8.9–10.3)
Chloride: 102 mmol/L (ref 98–111)
Creatinine, Ser: 1.29 mg/dL — ABNORMAL HIGH (ref 0.44–1.00)
GFR, Estimated: 48 mL/min — ABNORMAL LOW (ref >60)
Glucose, Bld: 289 mg/dL — ABNORMAL HIGH (ref 70–99)
Potassium: 3.6 mmol/L (ref 3.5–5.1)
Sodium: 136 mmol/L (ref 135–145)

## 2023-08-26 LAB — PHOSPHORUS: Phosphorus: 2.7 mg/dL (ref 2.5–4.6)

## 2023-08-26 LAB — MAGNESIUM: Magnesium: 1.9 mg/dL (ref 1.7–2.4)

## 2023-08-26 LAB — GLUCOSE, CAPILLARY
Glucose-Capillary: 324 mg/dL — ABNORMAL HIGH (ref 70–99)
Glucose-Capillary: 254 mg/dL — ABNORMAL HIGH (ref 70–99)

## 2023-08-26 MED ORDER — INSULIN GLARGINE-YFGN 100 UNIT/ML ~~LOC~~ SOLN
35.0000 [IU] | Freq: Every day | SUBCUTANEOUS | Status: DC
  Administered 2023-08-26: 35 [IU] via SUBCUTANEOUS
  Filled 2023-08-26: qty 0.35

## 2023-08-26 MED ORDER — LANTUS SOLOSTAR 100 UNIT/ML ~~LOC~~ SOPN: 40.0000 [IU] | PEN_INJECTOR | Freq: Every day | SUBCUTANEOUS | Status: DC

## 2023-08-26 MED ORDER — OZEMPIC (0.25 OR 0.5 MG/DOSE) 2 MG/3ML ~~LOC~~ SOPN: 1.0000 mg | PEN_INJECTOR | SUBCUTANEOUS | Status: DC

## 2023-08-26 NOTE — Discharge Instructions (Signed)
Follow with Primary MD Gildardo Pounds, NP in 7 days   Get CBC, CMP,checked  by Primary MD next visit.    Activity: As tolerated with Full fall precautions use walker/cane & assistance as needed   Disposition Home    Diet: Carb modified   On your next visit with your primary care physician please Get Medicines reviewed and adjusted.   Please request your Prim.MD to go over all Hospital Tests and Procedure/Radiological results at the follow up, please get all Hospital records sent to your Prim MD by signing hospital release before you go home.   If you experience worsening of your admission symptoms, develop shortness of breath, life threatening emergency, suicidal or homicidal thoughts you must seek medical attention immediately by calling 911 or calling your MD immediately  if symptoms less severe.  You Must read complete instructions/literature along with all the possible adverse reactions/side effects for all the Medicines you take and that have been prescribed to you. Take any new Medicines after you have completely understood and accpet all the possible adverse reactions/side effects.   Do not drive, operating heavy machinery, perform activities at heights, swimming or participation in water activities or provide baby sitting services if your were admitted for syncope or siezures until you have seen by Primary MD or a Neurologist and advised to do so again.  Do not drive when taking Pain medications.    Do not take more than prescribed Pain, Sleep and Anxiety Medications  Special Instructions: If you have smoked or chewed Tobacco  in the last 2 yrs please stop smoking, stop any regular Alcohol  and or any Recreational drug use.  Wear Seat belts while driving.   Please note  You were cared for by a hospitalist during your hospital stay. If you have any questions about your discharge medications or the care you received while you were in the hospital after you are  discharged, you can call the unit and asked to speak with the hospitalist on call if the hospitalist that took care of you is not available. Once you are discharged, your primary care physician will handle any further medical issues. Please note that NO REFILLS for any discharge medications will be authorized once you are discharged, as it is imperative that you return to your primary care physician (or establish a relationship with a primary care physician if you do not have one) for your aftercare needs so that they can reassess your need for medications and monitor your lab values.

## 2023-08-26 NOTE — TOC Transition Note (Addendum)
Transition of Care [facility]) - Discharge Note   Patient Details  Name: Barbara Wang MRN: [id number] Date of Birth: 1966-05-18  [facility] CM/SW Contact:  Lawerance Sabal, RN Phone Number: 08/26/2023, 11:16 AM   Clinical Narrative:     Requested HH services through:  [facility]- unable to accept [facility]- accepted  [facility]- unable to accept [facility]- unable to accept  [facility]- unable to accept  [facility]- pending [facility]- unable to accept    [facility] referral accepted by [facility]. Updated the patient's daughter [name], who asked to be the reference for scheduling and this was passed on to the [facility] liaison         Patient Goals and CMS Choice            Discharge Placement                       Discharge Plan and Services Additional resources added to the After Visit Summary for                                       Social Drivers of Health (SDOH) Interventions SDOH Screenings   Food Insecurity: Food Insecurity Present (10/09/2022)  Housing: Low Risk  (08/23/2023)  Transportation Needs: No Transportation Needs (08/24/2023)  Utilities: Not At Risk (08/23/2023)  Depression (PHQ2-9): High Risk (07/31/2023)  Financial Resource Strain: Low Risk  (07/31/2023)  Physical Activity: Inactive (07/31/2023)  Social Connections: Unknown (01/06/2022)   [facility]  Stress: No Stress Concern Present (07/31/2023)  Tobacco Use: High Risk (08/22/2023)  Health Literacy: Adequate Health Literacy (07/31/2023)     Readmission Risk Interventions     No data to display

## 2023-08-26 NOTE — Discharge Summary (Signed)
Physician Discharge Summary  Barbara Wang ZOX:[id number] DOB: May 05, 1966 DOA: 08/22/2023  PCP: Claiborne Rigg, NP  Admit date: 08/22/2023 Discharge date: 08/26/2023  Admitted From: (Home) Disposition:  (Home)  Recommendations for Outpatient Follow-up:  Follow up with PCP in 1-2 weeks Please obtain BMP/CBC in one week Please continue counseling about medication compliance, and diet compliance.  Home Health: (YES)  Discharge Condition: (Stable) CODE STATUS: (FULL) Diet recommendation: Heart Healthy  Brief/Interim Summary: 57 y.o. female with medical history significant of insulin-dependent type 2 diabetes, hypertension, hyperlipidemia, asthma, meningioma, depression, pancreatitis, obesity, chronic bronchitis, GERD, anemia presented to the ED via EMS for evaluation of altered mental status, hyperglycemia, and jerking movements of the right side of the body.  She was found to have diabetic ketoacidosis.  There was concern for seizure activity.  Neurology was consulted.  Patient was hospitalized.  No evidence of seizures on LTM EEG, her myoclonus jerking was felt secondary to hyperglycemia, please see discussion below.   Diabetic ketoacidosis/insulin-dependent diabetes mellitus type 2 HbA1c was 9.5 on 07/31/2023.  Suspect noncompliance at home. Came in with anion gap of 32.  Started on insulin infusion.  Started on IV fluids.   -Gap has closed, she was was transitioned to subcu insulin -Patient on Lantus 40 units twice daily at home, and Humalog 10 units 3 times daily before meals, but likely she is not taking her meds at home, as well she is with significant diet noncompliance as discussed with family she is eating a lot of sweets, she lives with her daughter, usually lives at work and morning and comes late in the evening, so we will try to minimize her regimen at least to ensure compliance, so we will continue with Lantus 40 units daily, and has no one at home during daytime to  give her Humalog before meals so we will hold on that, I will increase her Ozempic as well, but have discussed with her and the family most important issue is compliance with diet, will arrange for 8 and visiting nurse as well to assist with monitoring and observation. -I did try to start on low-dose metformin, but did cause significant diarrhea and bloating so had to be discontinued    UTI  -Treated with IV Rocephin   Acute kidney injury Baseline creatinine seems to be around 1.2 or so.  She was on lisinopril and HCTZ at home which is currently on hold. AKI thought to be due to severe dehydration.  Came in with creatinine of 2.9.  Being aggressively hydrated.  Monitor urine output.  Bladder scans.  This has resolved.   Myoclonic jerking of the right upper and lower extremity Neurology has seen the patient.  On LTM EEG, which was normal, her myoclonus twitching felt secondary to severe hyperglycemia but this has resolved this morning -No acute findings on MRI   Normocytic anemia Significant drop in hemoglobin is noted this morning at 9.1.  Was 14.4 overnight.  No overt bleeding has been noted.  Could be an erroneous read.  Pete labs reassuring   Acute metabolic encephalopathy Secondary to DKA.  Resolved   QT prolongation Monitor on telemetry.   Hyperbilirubinemia Continue to monitor trends.  History of CVA -MRI this admission with no evidence of acute CVA, but evidence of remote CVA, continue with aspirin and statin   Hypokalemia Hypomagnesemia Hypophosphatemia -Replaced,   History of essential hypertension Blood pressure has been low, so meds has been held, but started to increase so we will resume home  meds   Obesity Estimated body mass index is 33.15 kg/m as calculated from the following:   Height as of this encounter: 5\' 4"  (1.626 m).   Weight as of this encounter: 87.6 kg. Continue with Ozempic      Discharge Diagnoses:  Principal Problem:   DKA (diabetic  ketoacidosis) (HCC) Active Problems:   Essential hypertension   Asthma   Mixed hyperlipidemia   DKA, type 2, not at goal [facility])   AKI (acute kidney injury) (HCC)   Acute metabolic encephalopathy    Discharge Instructions  Discharge Instructions     Diet - low sodium heart healthy   Complete by: As directed    Discharge instructions   Complete by: As directed    Follow with Primary MD Claiborne Rigg, NP in 7 days   Get CBC, CMP,  checked  by Primary MD next visit.    Activity: As tolerated with Full fall precautions use walker/cane & assistance as needed   Disposition Home   Diet: Carb modified  On your next visit with your primary care physician please Get Medicines reviewed and adjusted.   Please request your Prim.MD to go over all Hospital Tests and Procedure/Radiological results at the follow up, please get all Hospital records sent to your Prim MD by signing hospital release before you go home.   If you experience worsening of your admission symptoms, develop shortness of breath, life threatening emergency, suicidal or homicidal thoughts you must seek medical attention immediately by calling 911 or calling your MD immediately  if symptoms less severe.  You Must read complete instructions/literature along with all the possible adverse reactions/side effects for all the Medicines you take and that have been prescribed to you. Take any new Medicines after you have completely understood and accpet all the possible adverse reactions/side effects.   Do not drive, operating heavy machinery, perform activities at heights, swimming or participation in water activities or provide baby sitting services if your were admitted for syncope or siezures until you have seen by Primary MD or a Neurologist and advised to do so again.  Do not drive when taking Pain medications.    Do not take more than prescribed Pain, Sleep and Anxiety Medications  Special Instructions: If you have  smoked or chewed Tobacco  in the last 2 yrs please stop smoking, stop any regular Alcohol  and or any Recreational drug use.  Wear Seat belts while driving.   Please note  You were cared for by a hospitalist during your hospital stay. If you have any questions about your discharge medications or the care you received while you were in the hospital after you are discharged, you can call the unit and asked to speak with the hospitalist on call if the hospitalist that took care of you is not available. Once you are discharged, your primary care physician will handle any further medical issues. Please note that NO REFILLS for any discharge medications will be authorized once you are discharged, as it is imperative that you return to your primary care physician (or establish a relationship with a primary care physician if you do not have one) for your aftercare needs so that they can reassess your need for medications and monitor your lab values.   Increase activity slowly   Complete by: As directed       Allergies as of 08/26/2023       Reactions   Penicillins Hives, Shortness Of Breath   Azithromycin Hives  Erythromycin Base Hives   Sulfa Antibiotics Hives   Tramadol Hives        Medication List     STOP taking these medications    HumaLOG KwikPen 100 UNIT/ML KwikPen Generic drug: insulin lispro       TAKE these medications    acetaminophen 500 MG tablet Commonly known as: TYLENOL Take 1,000 mg by mouth every 6 (six) hours as needed for moderate pain.   Advair HFA 230-21 MCG/ACT inhaler Generic drug: fluticasone-salmeterol Inhale 2 puffs into the lungs 2 (two) times daily.   aspirin EC 81 MG tablet Take 1 tablet (81 mg total) by mouth daily.   atorvastatin 40 MG tablet Commonly known as: LIPITOR Take 1 tablet (40 mg total) by mouth daily at 6pm.   budesonide-formoterol 160-4.5 MCG/ACT inhaler Commonly known as: SYMBICORT Inhale 2 puffs into the lungs 2 (two) times  daily.   chlorhexidine 0.12 % solution Commonly known as: Peridex Use as directed 15 mLs in the mouth or throat 2 (two) times daily.   gabapentin 300 MG capsule Commonly known as: NEURONTIN Take 300 mg by mouth daily as needed (for neuropathy).   gabapentin 600 MG tablet Commonly known as: Neurontin Take 1 tablet (600 mg total) by mouth daily as needed (for neuropathy in hands and feet).   GAS RELIEF 125 MG Caps Generic drug: Simethicone Take 125 mg by mouth every 6 (six) hours as needed (for gas).   guaifenesin 400 MG Tabs tablet Commonly known as: HUMIBID E Take 400 mg by mouth every 4 (four) hours as needed (for coughing or to thin mucus (phlegm) in the lungs).   Lantus SoloStar 100 UNIT/ML Solostar Pen Generic drug: insulin glargine Inject 40 Units into the skin daily. Please take 45 units if her CBG has been> 220 day before. What changed:  when to take this additional instructions   lisinopril-hydrochlorothiazide 20-25 MG tablet Commonly known as: ZESTORETIC Take 1 tablet by mouth daily.   mometasone 0.1 % cream Commonly known as: ELOCON Apply topically daily.   Ozempic (0.25 or 0.5 MG/DOSE) 2 MG/3ML Sopn Generic drug: Semaglutide(0.25 or 0.5MG /DOS) Inject 1 mg into the skin once a week. What changed: how much to take   pantoprazole 40 MG tablet Commonly known as: PROTONIX Take 1 tablet (40 mg total) by mouth daily. What changed: when to take this   phenazopyridine 95 MG tablet Commonly known as: PYRIDIUM Take 95 mg by mouth 3 (three) times daily as needed for pain.   Potassium 99 MG Tabs Take 99 mg by mouth daily.   sertraline 50 MG tablet Commonly known as: ZOLOFT Take 1 tablet (50 mg total) by mouth daily. FOR DEPRESSION   trazodone 300 MG tablet Commonly known as: DESYREL Take 1 tablet (300 mg total) by mouth at bedtime.   Triple Omega-3-6-9 Caps Take 1 capsule by mouth daily.   Ventolin HFA 108 (90 Base) MCG/ACT inhaler Generic drug:  albuterol Inhale 1-2 puffs into the lungs every 6 (six) hours as needed for wheezing or shortness of breath.   albuterol (2.5 MG/3ML) 0.083% nebulizer solution Commonly known as: PROVENTIL Take 3 mLs (2.5 mg total) by nebulization every 6 (six) hours as needed for wheezing or shortness of breath.        Allergies  Allergen Reactions   Penicillins Hives and Shortness Of Breath   Azithromycin Hives   Erythromycin Base Hives   Sulfa Antibiotics Hives   Tramadol Hives    Consultations: Neurology   Procedures/Studies: MR  BRAIN WO CONTRAST Result Date: 08/24/2023 CLINICAL DATA:  Altered mental status. Abnormal movements on the right side of body. EXAM: MRI HEAD WITHOUT CONTRAST TECHNIQUE: Multiplanar, multiecho pulse sequences of the brain and surrounding structures were obtained without intravenous contrast. COMPARISON:  CT head without contrast 08/23/2023. MR head without and with contrast 01/18/2020. FINDINGS: Brain: A remote hemorrhagic infarct of the right basal ganglia is new since 2021 but not acute. It measures up to 17 mm in transverse dimension. No acute infarct is present. Progressive atrophy and white matter disease is present bilaterally, asymmetric on the right. Ex vacuo dilation of the right lateral ventricle is noted. No significant extraaxial fluid collection is present. Remote lacunar infarcts are present in the inferior cerebellum bilaterally, stable. The planum sphenoidale a meningioma has slightly increased in size, now measuring 2.9 x 2.6 x 2.7 cm. This compares with previous measurements of 2.8 x 2.6 x 2.5 cm. No cavernous sinus invasion is present. 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 No other focal dural-based lesions are present. Vascular: Flow is present in the major intracranial arteries. Skull and upper cervical spine: The craniocervical junction is normal. Upper cervical spine is within normal limits. Marrow signal is unremarkable. Sinuses/Orbits: The paranasal sinuses  and mastoid air cells are clear. The globes and orbits are within normal limits. IMPRESSION: 1. No acute intracranial abnormality. 2. 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 3. Progressive atrophy and white matter disease bilaterally, asymmetric on the right. This likely reflects the sequela of chronic microvascular ischemia. 4. Remote lacunar infarcts in the inferior cerebellum bilaterally, stable. 5. Slight interval increase in size of planum sphenoidale meningioma, now measuring 2.9 x 2.6 x 2.7 cm. This compares with previous measurements of 2.8 x 2.6 x 2.5 cm. No cavernous sinus invasion is present. 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 Electronically Signed   By: Marin Roberts M.D.   On: 08/24/2023 15:00   DG Abd Portable 1V Result Date: 08/23/2023 CLINICAL DATA:  57 year old female with left abdominal pain. EXAM: PORTABLE ABDOMEN - 1 VIEW COMPARISON:  CT Abdomen and Pelvis 11/30/2019. FINDINGS: Portable AP supine views at 0857 hours. Lung bases appear clear. Stable cholecystectomy clips. Non obstructed bowel gas pattern. No pneumoperitoneum evident on these supine views. Other abdominal visceral contours appear stable. Incidental pelvic phleboliths. No acute osseous abnormality identified. IMPRESSION: Negative bowel gas pattern. Electronically Signed   By: Odessa Fleming M.D.   On: 08/23/2023 09:56   Overnight EEG with video Result Date: 08/23/2023 Charlsie Quest, MD     08/24/2023  8:03 AM Patient Name: NYJAH CHOUDHURY MRN: [id number] Epilepsy Attending: Charlsie Quest Referring Physician/Provider: Milon Dikes, MD Duration: 12/26/ 2024 0427 to  08/24/2023 0427 Patient history: 57 yo F presented with altered mental status and jerking movements on the right side of the body. EEG to evaluate for seizure Level of alertness:  lethargic --->awake, asleep AEDs during EEG study: None Technical aspects: This EEG study was done with scalp electrodes positioned  according to the 10-20 International system of electrode placement. Electrical activity was reviewed with band pass filter of 1-70Hz , sensitivity of 7 uV/mm, display speed of 83mm/sec with a 60Hz  notched filter applied as appropriate. EEG data were recorded continuously and digitally stored.  Video monitoring was available and reviewed as appropriate. Description: EEG initially showed continuous generalized predominantly 5-9Hz  theta-alpha activity admixed with intermittent 2-3 Hz delta slowing. Gradually EEG improved and showed posterior dominant rhythm of 8 Hz activity  of moderate voltage (25-35 uV) seen predominantly in posterior head regions, symmetric and reactive to eye opening and eye closing. Sleep was characterized by vertex waves, sleep spindles (12 to 14 Hz), maximal frontocentral region. Hyperventilation and photic stimulation were not performed.   ABNORMALITY - Continuous slow, generalized IMPRESSION: This study was initially suggestive of moderate diffuse encephalopathy. Gradually EEG improved and was within normal limits. No seizure or definite epileptiform discharges were noted. Charlsie Quest   DG Chest Port 1 View Result Date: 08/23/2023 CLINICAL DATA:  Hyperglycemia, altered mental status. EXAM: PORTABLE CHEST 1 VIEW COMPARISON:  July 16, 2023 FINDINGS: The heart size and mediastinal contours are within normal limits. There is no evidence of an acute infiltrate, pleural effusion or pneumothorax. Multilevel degenerative changes seen throughout the thoracic spine. IMPRESSION: No active cardiopulmonary disease. Electronically Signed   By: Aram Candela M.D.   On: 08/23/2023 00:58   CT Head Wo Contrast Result Date: 08/23/2023 CLINICAL DATA:  Altered mental status EXAM: CT HEAD WITHOUT CONTRAST TECHNIQUE: Contiguous axial images were obtained from the base of the skull through the vertex without intravenous contrast. RADIATION DOSE REDUCTION: This exam was performed according to the  departmental dose-optimization program which includes automated exposure control, adjustment of the mA and/or kV according to patient size and/or use of iterative reconstruction technique. COMPARISON:  July 16, 2023 FINDINGS: Brain: There is mild cerebral atrophy with widening of the extra-axial spaces and ventricular dilatation. There are areas of decreased attenuation within the white matter tracts of the supratentorial brain, consistent with microvascular disease changes. A chronic right lacunar infarct is again seen. A stable 2.8 cm x 2.1 cm suprasellar mass is seen (best seen on coronal reformatted image 41, CT series 5/sagittal reformatted image 31, CT series 6). Vascular: No hyperdense vessel or unexpected calcification. Skull: Normal. Negative for fracture or focal lesion. Sinuses/Orbits: No acute finding. Other: None. IMPRESSION: 1. Generalized cerebral atrophy and microvascular disease changes of the supratentorial brain. 2. Stable suprasellar meningioma, confirmed on prior MRI valuation. 3. Chronic right lacunar infarct. 4. No acute intracranial abnormality. Electronically Signed   By: Aram Candela M.D.   On: 08/23/2023 00:55      Subjective: Significant events overnight as discussed with staff, she reports her bloating and diarrhea there but much improved after stopping metformin  Discharge Exam: Vitals:   08/26/23 0804 08/26/23 0813  BP:  (!) 120/59  Pulse:  87  Resp:  18  Temp:  98.2 F (36.8 C)  SpO2: 95% 92%   Vitals:   08/25/23 2322 08/26/23 0452 08/26/23 0804 08/26/23 0813  BP: (!) 165/91 (!) 147/79  (!) 120/59  Pulse: (!) 102 77  87  Resp: 18 18  18   Temp: 98 F (36.7 C) 98 F (36.7 C)  98.2 F (36.8 C)  TempSrc: Oral Oral  Oral  SpO2: 97% 97% 95% 92%  Weight:      Height:        General: Pt is alert, awake, not in acute distress Cardiovascular: RRR, S1/S2 +, no rubs, no gallops Respiratory: CTA bilaterally, no wheezing, no rhonchi Abdominal: Soft, NT,  ND, bowel sounds + Extremities: no edema, no cyanosis    The results of significant diagnostics from this hospitalization (including imaging, microbiology, ancillary and laboratory) are listed below for reference.     Microbiology: Recent Results (from the past 240 hours)  Urine Culture     Status: Abnormal   Collection Time: 08/23/23  1:41 PM   Specimen: Urine,  Clean Catch  Result Value Ref Range Status   Specimen Description URINE, CLEAN CATCH  Final   Special Requests   Final    NONE Performed at [facility], [address], [place], [place] [address]    Culture MULTIPLE SPECIES PRESENT, SUGGEST RECOLLECTION (A)  Final   Report Status 08/24/2023 FINAL  Final     Labs: BNP (last 3 results) No results for input(s): "BNP" in the last 8760 hours. Basic Metabolic Panel: Recent Labs  Lab 08/23/23 1111 08/23/23 1528 08/23/23 2028 08/24/23 0805 08/24/23 0808 08/25/23 0350 08/26/23 0404  NA  --  140 136 135  --  139 136  K  --  2.5* 2.9* 3.5  --  3.1* 3.6  CL  --  102 99 98  --  103 102  CO2  --  27 27 26   --  28 24  GLUCOSE  --  102* 240* 357*  --  318* 289*  BUN  --  31* 26* 25*  --  15 15  CREATININE  --  1.97* 1.92* 1.70*  --  1.26* 1.29*  CALCIUM  --  9.7 9.1 8.8*  --  8.8* 8.8*  MG 1.0*  --   --  2.7* 2.6* 2.2 1.9  PHOS  --   --  <1.0* 3.5  --  4.1 2.7   Liver Function Tests: Recent Labs  Lab 08/23/23 0004 08/24/23 0805  AST 13* 14*  ALT 14 9  ALKPHOS 105 73  BILITOT 2.1* 0.5  PROT 8.1 6.7  ALBUMIN 3.9 3.0*   No results for input(s): "LIPASE", "AMYLASE" in the last 168 hours. No results for input(s): "AMMONIA" in the last 168 hours. CBC: Recent Labs  Lab 08/23/23 0456 08/23/23 1528 08/24/23 0513 08/25/23 0350 08/26/23 0404  WBC 5.8 9.1 7.2 5.5 6.5  HGB 9.1* 12.5 12.0 12.4 11.9*  HCT 26.1* 36.0 34.0* 36.9 35.5*  MCV 79.8* 79.8* 80.0 82.4 82.9  PLT 331 469* 340 352 283   Cardiac Enzymes: No results for input(s): "CKTOTAL", "CKMB",  "CKMBINDEX", "TROPONINI" in the last 168 hours. BNP: Invalid input(s): "POCBNP" CBG: Recent Labs  Lab 08/25/23 1220 08/25/23 1719 08/25/23 1947 08/26/23 0811 08/26/23 1125  GLUCAP 224* 113* 257* 324* 254*   D-Dimer No results for input(s): "DDIMER" in the last 72 hours. Hgb A1c No results for input(s): "HGBA1C" in the last 72 hours. Lipid Profile No results for input(s): "CHOL", "HDL", "LDLCALC", "TRIG", "CHOLHDL", "LDLDIRECT" in the last 72 hours. Thyroid function studies No results for input(s): "TSH", "T4TOTAL", "T3FREE", "THYROIDAB" in the last 72 hours.  Invalid input(s): "FREET3" Anemia work up No results for input(s): "VITAMINB12", "FOLATE", "FERRITIN", "TIBC", "IRON", "RETICCTPCT" in the last 72 hours. Urinalysis    Component Value Date/Time   COLORURINE YELLOW 08/23/2023 1341   APPEARANCEUR CLOUDY (A) 08/23/2023 1341   APPEARANCEUR Cloudy (A) 07/31/2023 1705   LABSPEC 1.024 08/23/2023 1341   PHURINE 5.0 08/23/2023 1341   GLUCOSEU >=500 (A) 08/23/2023 1341   HGBUR MODERATE (A) 08/23/2023 1341   BILIRUBINUR NEGATIVE 08/23/2023 1341   BILIRUBINUR Negative 07/31/2023 1705   KETONESUR 80 (A) 08/23/2023 1341   PROTEINUR 30 (A) 08/23/2023 1341   UROBILINOGEN 1.0 12/29/2022 1226   UROBILINOGEN 0.2 01/15/2016 1524   NITRITE NEGATIVE 08/23/2023 1341   LEUKOCYTESUR LARGE (A) 08/23/2023 1341   Sepsis Labs Recent Labs  Lab 08/23/23 1528 08/24/23 0513 08/25/23 0350 08/26/23 0404  WBC 9.1 7.2 5.5 6.5   Microbiology Recent Results (from the  past 240 hours)  Urine Culture     Status: Abnormal   Collection Time: 08/23/23  1:41 PM   Specimen: Urine, Clean Catch  Result Value Ref Range Status   Specimen Description URINE, CLEAN CATCH  Final   Special Requests   Final    NONE Performed at [facility], [address] [place], [place] [address]    Culture MULTIPLE SPECIES PRESENT, SUGGEST RECOLLECTION (A)  Final   Report Status 08/24/2023 FINAL  Final      Time coordinating discharge: Over 30 minutes  SIGNED:   Huey Bienenstock, MD  [facility] 08/26/2023, 12:19 PM Pager   If 7PM-7AM, please contact night-coverage [email or web address] Password [email or web address]

## 2023-08-26 NOTE — Plan of Care (Signed)

## 2023-08-27 ENCOUNTER — Telehealth: Payer: Self-pay

## 2023-08-27 NOTE — Transitions of Care (Post Inpatient/ED Visit) (Signed)
   08/27/2023  Name: Barbara Wang MRN: [id number] DOB: [birth year]  Today's TOC FU Call Status: Today's TOC FU Call Status:: Unsuccessful Call (1st Attempt) Unsuccessful Call (1st Attempt) Date: 08/27/23  Attempted to reach the patient regarding the most recent Inpatient/ED visit.  Follow Up Plan: Additional outreach attempts will be made to reach the patient to complete the Transitions of Care (Post Inpatient/ED visit) call.   Signature  Robyne Peers, RN

## 2023-08-28 ENCOUNTER — Telehealth: Payer: Self-pay

## 2023-08-28 NOTE — Transitions of Care (Post Inpatient/ED Visit) (Signed)
   08/28/2023  Name: SHERANDA SEABROOKS MRN: [id number] DOB: Mar 11, 1966  Today's TOC FU Call Status: Today's TOC FU Call Status:: Unsuccessful Call (2nd Attempt) Unsuccessful Call (1st Attempt) Date: 08/27/23 Unsuccessful Call (2nd Attempt) Date: 08/28/23  Attempted to reach the patient regarding the most recent Inpatient/ED visit.  Follow Up Plan: Additional outreach attempts will be made to reach the patient to complete the Transitions of Care (Post Inpatient/ED visit) call.   Signature  Slater Diesel, RN

## 2023-08-30 ENCOUNTER — Telehealth: Payer: Self-pay

## 2023-08-30 NOTE — Transitions of Care (Post Inpatient/ED Visit) (Signed)
 08/30/2023  Name: Barbara Wang MRN: [id number] DOB: 07/28/1966  Today's TOC FU Call Status: Today's TOC FU Call Status:: Successful TOC FU Call Completed Unsuccessful Call (1st Attempt) Date: 08/27/23 Unsuccessful Call (2nd Attempt) Date: 08/28/23 [facility] FU Call Complete Date: 08/30/23 Patient's Name and Date of Birth confirmed.  Transition Care Management Follow-up Telephone Call Date of Discharge: 08/26/23 Discharge Facility: [facility] Type of Discharge: Inpatient Admission Primary Inpatient Discharge Diagnosis:: DKA How have you been since you were released from the hospital?: Better Any questions or concerns?: Yes Patient Questions/Concerns:: She is requesting a podaitry referral Patient Questions/Concerns Addressed: Notified Provider of Patient Questions/Concerns  Items Reviewed: Did you receive and understand the discharge instructions provided?: Yes Medications obtained,verified, and reconciled?: No (She confirmed she has a glucometer and nebulizer.  She was not sure if she has all of her medications.) Medications Not Reviewed Reasons:: Other: (She didn't have the list of medications with her; but said she needs a refill of trazodone .I instructed her to review her meds and to call this clinic if she has any questions or needs additional refills.  She said she understood.) Any new allergies since your discharge?: No Dietary orders reviewed?: Yes Type of Diet Ordered:: heart healthy,diabetic Do you have support at home?: Yes People in Home: child(ren), adult Name of Support/Comfort Primary Source: her daughter  Medications Reviewed Today: Medications Reviewed Today   Medications were not reviewed in this encounter     Home Care and Equipment/Supplies: Were Home Health Services Ordered?: Yes Name of Home Health Agency:: [facility] Has Agency set up a time to come to your home?: No EMR reviewed for Home Health Orders: Orders present/patient has not received call  (refer to CM for follow-up) (I spoke to [facility], [facility] Liaison, who confirmed that they have accepted the referral and she will call patient and her daughter, [name], and provide them with an update on the status of a home visit.) Any new equipment or medical supplies ordered?: No  Functional Questionnaire: Do you need assistance with bathing/showering or dressing?: No (She is fearful of falling and would benefit from having her daughter present when she showers.) Do you need assistance with meal preparation?: Yes Do you need assistance with eating?: No Do you have difficulty maintaining continence: No Do you need assistance with getting out of bed/getting out of a chair/moving?: Yes (ambulates with a cane) Do you have difficulty managing or taking your medications?: Yes (She said she needs help managing her medications but she could not specify what help she needs.  She said everything- remembering to take meds, including administering insulin .  I asked if bubble packing might help and she said yes) I explained to her that the home health nurse should also be able to help her with establishing a method to help better manage her meds,  reminders of  when to take them; however there is no one that will come to administer the insulin  or her oral medications. She said her daughter works and is not always available to help.  I spoke to [facility], [facility] liaison, and explained to her that the patient will need assistance with establishing a medication management regime.    Follow up appointments reviewed: PCP Follow-up appointment confirmed?: Yes Date of PCP follow-up appointment?: 09/24/23 Follow-up Provider: Haze Servant, NP [facility] Follow-up appointment confirmed?: NA Do you need transportation to your follow-up appointment?: Yes (She said she will need a ride and she was not aware that her insurance  would provide rides to medical appointments. I sent the phone number for  transportation to her MyChart account as she requested.) Transportation Need Intervention Addressed By:: Other: (noted above) Do you understand care options if your condition(s) worsen?: Yes-patient verbalized understanding    SIGNATURE Slater Diesel, RN

## 2023-08-30 NOTE — Telephone Encounter (Addendum)
 Hi Zelda,  She is requesting a refill of trazodone and a podiatry referral.

## 2023-09-01 ENCOUNTER — Other Ambulatory Visit: Payer: Self-pay

## 2023-09-01 ENCOUNTER — Other Ambulatory Visit: Payer: Self-pay | Admitting: Nurse Practitioner

## 2023-09-01 DIAGNOSIS — E1142 Type 2 diabetes mellitus with diabetic polyneuropathy: Secondary | ICD-10-CM

## 2023-09-01 DIAGNOSIS — F5101 Primary insomnia: Secondary | ICD-10-CM

## 2023-09-01 MED ORDER — TRAZODONE HCL 300 MG PO TABS
300.0000 mg | ORAL_TABLET | Freq: Every day | ORAL | 1 refills | Status: DC
  Filled 2023-09-01 – 2023-12-04 (×3): qty 90, 90d supply, fill #0

## 2023-09-01 NOTE — Telephone Encounter (Signed)
 Please let her know she was referred to podiatry in February. They reached out to her several times with calls and she did not respond.  She needs to make sure her voicemail is set up and check her messages. They will close out the referrals if they can not get in touch with her. I have placed another referral.

## 2023-09-03 ENCOUNTER — Other Ambulatory Visit: Payer: Self-pay

## 2023-09-03 NOTE — Telephone Encounter (Signed)
 Unable to reach patient by phone to relay response.  Voicemail left to return call. CRM created.

## 2023-09-10 ENCOUNTER — Other Ambulatory Visit: Payer: Self-pay

## 2023-09-10 ENCOUNTER — Other Ambulatory Visit (HOSPITAL_COMMUNITY): Payer: Self-pay

## 2023-09-11 ENCOUNTER — Other Ambulatory Visit: Payer: Self-pay

## 2023-09-24 ENCOUNTER — Ambulatory Visit: Payer: Medicaid Other | Attending: Nurse Practitioner | Admitting: Nurse Practitioner

## 2023-09-24 ENCOUNTER — Other Ambulatory Visit: Payer: Self-pay

## 2023-09-24 ENCOUNTER — Telehealth: Payer: Self-pay

## 2023-09-24 ENCOUNTER — Encounter: Payer: Self-pay | Admitting: Nurse Practitioner

## 2023-09-24 VITALS — BP 118/72 | HR 107 | Ht 64.0 in | Wt 181.0 lb

## 2023-09-24 DIAGNOSIS — D75839 Thrombocytosis, unspecified: Secondary | ICD-10-CM

## 2023-09-24 DIAGNOSIS — F331 Major depressive disorder, recurrent, moderate: Secondary | ICD-10-CM

## 2023-09-24 DIAGNOSIS — I10 Essential (primary) hypertension: Secondary | ICD-10-CM

## 2023-09-24 DIAGNOSIS — R7989 Other specified abnormal findings of blood chemistry: Secondary | ICD-10-CM | POA: Diagnosis not present

## 2023-09-24 DIAGNOSIS — N179 Acute kidney failure, unspecified: Secondary | ICD-10-CM | POA: Diagnosis not present

## 2023-09-24 DIAGNOSIS — R946 Abnormal results of thyroid function studies: Secondary | ICD-10-CM

## 2023-09-24 DIAGNOSIS — E876 Hypokalemia: Secondary | ICD-10-CM

## 2023-09-24 DIAGNOSIS — E119 Type 2 diabetes mellitus without complications: Secondary | ICD-10-CM | POA: Diagnosis not present

## 2023-09-24 DIAGNOSIS — Z794 Long term (current) use of insulin: Secondary | ICD-10-CM

## 2023-09-24 DIAGNOSIS — Z09 Encounter for follow-up examination after completed treatment for conditions other than malignant neoplasm: Secondary | ICD-10-CM | POA: Diagnosis not present

## 2023-09-24 DIAGNOSIS — N3942 Incontinence without sensory awareness: Secondary | ICD-10-CM

## 2023-09-24 DIAGNOSIS — Z8673 Personal history of transient ischemic attack (TIA), and cerebral infarction without residual deficits: Secondary | ICD-10-CM

## 2023-09-24 DIAGNOSIS — K219 Gastro-esophageal reflux disease without esophagitis: Secondary | ICD-10-CM

## 2023-09-24 MED ORDER — SERTRALINE HCL 100 MG PO TABS
100.0000 mg | ORAL_TABLET | Freq: Every day | ORAL | 1 refills | Status: DC
  Filled 2023-09-24 – 2023-12-04 (×2): qty 90, 90d supply, fill #0

## 2023-09-24 MED ORDER — POTASSIUM CHLORIDE CRYS ER 20 MEQ PO TBCR
20.0000 meq | EXTENDED_RELEASE_TABLET | Freq: Two times a day (BID) | ORAL | 1 refills | Status: DC
  Filled 2023-09-24 – 2023-12-04 (×2): qty 90, 45d supply, fill #0
  Filled 2024-01-14: qty 90, 45d supply, fill #1

## 2023-09-24 MED ORDER — LISINOPRIL-HYDROCHLOROTHIAZIDE 20-25 MG PO TABS
1.0000 | ORAL_TABLET | Freq: Every day | ORAL | 1 refills | Status: DC
  Filled 2023-09-24 – 2023-12-04 (×2): qty 90, 90d supply, fill #0
  Filled 2024-03-27: qty 90, 90d supply, fill #1

## 2023-09-24 MED ORDER — OMEGA-3-ACID ETHYL ESTERS 1 G PO CAPS
1.0000 g | ORAL_CAPSULE | Freq: Every day | ORAL | 3 refills | Status: DC
  Filled 2023-09-24 – 2023-12-04 (×2): qty 90, 90d supply, fill #0
  Filled 2024-07-25: qty 90, 90d supply, fill #1

## 2023-09-24 MED ORDER — SEMAGLUTIDE (1 MG/DOSE) 4 MG/3ML ~~LOC~~ SOPN
1.0000 mg | PEN_INJECTOR | SUBCUTANEOUS | 3 refills | Status: DC
  Filled 2023-09-24 – 2023-12-04 (×2): qty 3, 28d supply, fill #0
  Filled 2024-01-14: qty 3, 28d supply, fill #1
  Filled 2024-02-10: qty 3, 28d supply, fill #2
  Filled 2024-03-27 (×2): qty 3, 28d supply, fill #3

## 2023-09-24 MED ORDER — PANTOPRAZOLE SODIUM 40 MG PO TBEC
40.0000 mg | DELAYED_RELEASE_TABLET | Freq: Every day | ORAL | 1 refills | Status: AC
  Filled 2023-09-24 – 2023-12-04 (×2): qty 30, 30d supply, fill #0
  Filled 2024-01-14: qty 30, 30d supply, fill #1
  Filled 2024-02-10: qty 30, 30d supply, fill #2
  Filled 2024-03-27: qty 30, 30d supply, fill #3

## 2023-09-24 MED ORDER — ASPIRIN 81 MG PO TBEC
81.0000 mg | DELAYED_RELEASE_TABLET | Freq: Every day | ORAL | 3 refills | Status: AC
  Filled 2023-09-24 – 2024-01-14 (×2): qty 30, 30d supply, fill #0
  Filled 2024-02-10: qty 30, 30d supply, fill #1

## 2023-09-24 MED ORDER — ATORVASTATIN CALCIUM 40 MG PO TABS
40.0000 mg | ORAL_TABLET | Freq: Every day | ORAL | 3 refills | Status: AC
  Filled 2023-09-24 – 2023-12-04 (×2): qty 90, 90d supply, fill #0
  Filled 2024-07-25: qty 90, 90d supply, fill #1

## 2023-09-24 NOTE — Progress Notes (Addendum)
 Assessment & Plan:  [name] was seen today for hospitalization follow-up.  Diagnoses and all orders for this visit:  Low TSH level -     Thyroid  Panel With [facility] discharge follow-up  Type 2 diabetes mellitus with insulin  therapy (HCC) -     Semaglutide , 1 MG/DOSE, 4 MG/3ML SOPN; Inject 1 mg into the skin once a week. -     atorvastatin  (LIPITOR) 40 MG tablet; Take 1 tablet (40 mg total) by mouth daily at 6 PM.  AKI (acute kidney injury) (HCC) -     CMP14+EGFR  Elevated platelet count -     CBC with Differential  Primary hypertension -     lisinopril -hydrochlorothiazide  (ZESTORETIC ) 20-25 MG tablet; Take 1 tablet by mouth daily. (Patient not taking: Reported on 02/20/2024)  Gastroesophageal reflux disease, unspecified whether esophagitis present -     pantoprazole  (PROTONIX ) 40 MG tablet; Take 1 tablet (40 mg total) by mouth daily.  Moderate episode of recurrent major depressive disorder (HCC) -     sertraline  (ZOLOFT ) 100 MG tablet; Take 1 tablet (100 mg total) by mouth daily FOR DEPRESSION.  History of CVA (cerebrovascular accident) without residual deficits -     aspirin  EC 81 MG tablet; Take 1 tablet (81 mg total) by mouth daily. -     omega-3 acid ethyl esters (LOVAZA ) 1 g capsule; Take 1 capsule (1 g total) by mouth daily.  Hypokalemia -     potassium chloride  SA (KLOR-CON  M) 20 MEQ tablet; Take 1 tablet (20 mEq total) by mouth 2 (two) times daily. (Patient not taking: Reported on 02/20/2024)  Urinary incontinence without sensory awareness -     Misc. Devices MISC; Please provide patient with incontinence brief of choice. N39.42    Patient has been counseled on age-appropriate routine health concerns for screening and prevention. These are reviewed and up-to-date. Referrals have been placed accordingly. Immunizations are up-to-date or declined.    Subjective:   Chief Complaint  Patient presents with   Hospitalization Follow-up    Barbara H  Wang 58 y.o. female presents to office today for HFU  She has a past medical history of Asthma, Brain tumor, Cellulitis (06/2015), Depression, History of hiatal hernia, Hyperlipemia, Hypertension, Neuropathy, Obesity, Pancreatitis, and Type II diabetes mellitus    Barbara Wang was admitted to the hosptial on 08-22-23 for evaluation of altered mental status, hyperglycemia, and jerking movements of the right side of the body.  She was found to have diabetic ketoacidosis.  There was concern for seizure activity.  Neurology was consulted.  Patient was hospitalized.  No evidence of seizures on LTM EEG, her myoclonus jerking was felt secondary to hyperglycemia   A1c in hospital 9.5 due to nonadherence. She was started on insulin  drip and IVFs. Cr+ 2.9 so zestoretic  was held temporarily. It was resumed upon discharge. Ozempic  was increased. Humalog  was stopped and she was instructed to continue on Lantus .     Today she is requesting personal care services doing assessment in her home.  She notes shortness of breath with minimal activity including household chores and ADLs.    She wants to know what she should do about remote CVA seen on imaging.  Instructions given on keeping blood pressure well-controlled, cholesterol levels normal, taking atorvastatin  as prescribed and healthy low-fat low-cholesterol diet   Blood pressure is well-controlled today. BP Readings from Last 3 Encounters:  02/19/24 (!) 152/88  09/24/23 118/72  08/26/23 (!) 120/59     Barbara Wang has a  history of frequent UTI's. She reports wearing incontinence briefs for Mixed urge and stress incontinence. She reports history of incontinence greater than 5 years. Incontinence worse with coughing or bearing down. She has a history of poorly controlled diabetes with decreased sensory awareness.  She was prescribed gabapentin  600 mg however she states this dosage caused her to have loose so she is currently only taking half a tablet.      Review of Systems  Constitutional:  Negative for fever, malaise/fatigue and weight loss.  HENT: Negative.  Negative for nosebleeds.   Eyes: Negative.  Negative for blurred vision, double vision and photophobia.  Respiratory: Negative.  Negative for cough and shortness of breath.   Cardiovascular: Negative.  Negative for chest pain, palpitations and leg swelling.  Gastrointestinal: Negative.  Negative for heartburn, nausea and vomiting.  Genitourinary:        Urinary incontinence  Musculoskeletal:  Positive for myalgias.  Neurological: Negative.  Negative for dizziness, focal weakness, seizures and headaches.  Psychiatric/Behavioral:  Positive for memory loss. Negative for suicidal ideas.     Past Medical History:  Diagnosis Date   Asthma    Brain tumor (HCC)    Cellulitis 06/2015   rt hand    Depression    History of hiatal hernia    it went away on it's own   Hyperlipemia    Hypertension    Neuropathy     feet & hands    Obesity    Pancreatitis    Type II diabetes mellitus (HCC)    insulin  dependent     Past Surgical History:  Procedure Laterality Date   ABDOMINAL HYSTERECTOMY     BREAST CYST EXCISION Right 10+ yrs ago   abscess removed   CESAREAN SECTION  1981; 1987   CHOLECYSTECTOMY  05/11/2012   Procedure: LAPAROSCOPIC CHOLECYSTECTOMY WITH INTRAOPERATIVE CHOLANGIOGRAM;  Surgeon: Camellia CHRISTELLA Blush, MD,FACS;  Location: [facility] OR;  Service: General;  Laterality: N/A;   SHOULDER ARTHROSCOPY W/ ROTATOR CUFF REPAIR Right    TUBAL LIGATION  10/25/1999   thelbert 01/11/2011    Family History  Problem Relation Age of Onset   Stroke Mother    Breast cancer Mother 19   Diabetes Father    Diabetes Sister    Breast cancer Maternal Aunt    Breast cancer Maternal Aunt    Breast cancer Maternal Grandmother     Social History Reviewed with no changes to be made today.   Outpatient Medications Prior to Visit  Medication Sig Dispense Refill   albuterol  (PROAIR  HFA) 108  (90 Base) MCG/ACT inhaler Inhale 1-2 puffs into the lungs every 6 (six) hours as needed for wheezing or shortness of breath. 18 g 1   albuterol  (PROVENTIL ) (2.5 MG/3ML) 0.083% nebulizer solution Take 3 mLs (2.5 mg total) by nebulization every 6 (six) hours as needed for wheezing or shortness of breath. 150 mL 1   budesonide -formoterol  (SYMBICORT ) 160-4.5 MCG/ACT inhaler Inhale 2 puffs into the lungs 2 (two) times daily. 10.2 g 6   fluticasone -salmeterol (ADVAIR  HFA) 230-21 MCG/ACT inhaler Inhale 2 puffs into the lungs 2 (two) times daily. 12 g 12   gabapentin  (NEURONTIN ) 600 MG tablet Take 1 tablet (600 mg total) by mouth daily as needed (for neuropathy in hands and feet). 90 tablet 1   mometasone  (ELOCON ) 0.1 % cream Apply topically daily. 60 g 1   trazodone  (DESYREL ) 300 MG tablet Take 1 tablet (300 mg total) by mouth at bedtime. 90 tablet 1   acetaminophen  (  TYLENOL ) 500 MG tablet Take 1,000 mg by mouth every 6 (six) hours as needed for moderate pain.     aspirin  81 MG EC tablet Take 1 tablet (81 mg total) by mouth daily. 90 tablet 3   atorvastatin  (LIPITOR) 40 MG tablet Take 1 tablet (40 mg total) by mouth daily at 6pm. 90 tablet 3   chlorhexidine  (PERIDEX ) 0.12 % solution Use as directed 15 mLs in the mouth or throat 2 (two) times daily. (Patient not taking: Reported on 08/25/2023) 1000 mL 3   gabapentin  (NEURONTIN ) 300 MG capsule Take 300 mg by mouth daily as needed (for neuropathy).     guaifenesin  (HUMIBID E) 400 MG TABS tablet Take 400 mg by mouth every 4 (four) hours as needed (for coughing or to thin mucus (phlegm) in the lungs).     insulin  glargine (LANTUS  SOLOSTAR) 100 UNIT/ML Solostar Pen Inject 40 Units into the skin daily. Please take 45 units if her CBG has been> 220 day before.     lisinopril -hydrochlorothiazide  (ZESTORETIC ) 20-25 MG tablet Take 1 tablet by mouth daily. 90 tablet 1   Omega 3-6-9 Fatty Acids (TRIPLE OMEGA-3-6-9) CAPS Take 1 capsule by mouth daily.     pantoprazole   (PROTONIX ) 40 MG tablet Take 1 tablet (40 mg total) by mouth daily. (Patient taking differently: Take 40 mg by mouth daily before breakfast.) 30 tablet 2   phenazopyridine  (PYRIDIUM ) 95 MG tablet Take 95 mg by mouth 3 (three) times daily as needed for pain.     Potassium 99 MG TABS Take 99 mg by mouth daily.     Semaglutide ,0.25 or 0.5MG /DOS, (OZEMPIC , 0.25 OR 0.5 MG/DOSE,) 2 MG/3ML SOPN Inject 1 mg into the skin once a week.     sertraline  (ZOLOFT ) 50 MG tablet Take 1 tablet (50 mg total) by mouth daily. FOR DEPRESSION 90 tablet 2   Simethicone  (GAS RELIEF) 125 MG CAPS Take 125 mg by mouth every 6 (six) hours as needed (for gas).     No facility-administered medications prior to visit.    Allergies  Allergen Reactions   Penicillins Hives and Shortness Of Breath   Azithromycin Hives   Erythromycin Base Hives   Sulfa Antibiotics Hives   Tramadol  Hives       Objective:    BP 118/72 (BP Location: Left Arm, Patient Position: Sitting, Cuff Size: Normal)   Pulse (!) 107   Ht 5' 4 (1.626 m)   Wt 181 lb (82.1 kg)   SpO2 98%   BMI 31.07 kg/m  Wt Readings from Last 3 Encounters:  02/14/24 169 lb (76.7 kg)  09/24/23 181 lb (82.1 kg)  08/25/23 193 lb 2 oz (87.6 kg)    Physical Exam Vitals and nursing note reviewed.  Constitutional:      Appearance: She is well-developed.  HENT:     Head: Normocephalic and atraumatic.  Cardiovascular:     Rate and Rhythm: Normal rate and regular rhythm.     Heart sounds: Normal heart sounds. No murmur heard.    No friction rub. No gallop.  Pulmonary:     Effort: Pulmonary effort is normal. No tachypnea or respiratory distress.     Breath sounds: Normal breath sounds. No decreased breath sounds, wheezing, rhonchi or rales.  Chest:     Chest wall: No tenderness.  Abdominal:     General: Bowel sounds are normal.     Palpations: Abdomen is soft.  Musculoskeletal:        General: Normal range of motion.  Cervical back: Normal range of motion.   Skin:    General: Skin is warm and dry.  Neurological:     Mental Status: She is alert and oriented to person, place, and time.     Coordination: Coordination normal.     Comments: Uses straight cane to assist with ambulation  Psychiatric:        Behavior: Behavior normal. Behavior is cooperative.        Thought Content: Thought content normal.        Judgment: Judgment normal.          Patient has been counseled extensively about nutrition and exercise as well as the importance of adherence with medications and regular follow-up. The patient was given clear instructions to go to ER or return to medical center if symptoms don't improve, worsen or new problems develop. The patient verbalized understanding.   Follow-up: Return in about 2 months (around 11/22/2023) for a1c.   Haze LELON Servant, FNP-BC [facility] [place], [place] [phone]   03/18/2024, 10:02 PM

## 2023-09-24 NOTE — Patient Instructions (Addendum)
[facility]               [address] [place], [place] [address] Ph: [phone]

## 2023-09-24 NOTE — Telephone Encounter (Signed)
Pharmacy Patient Advocate Encounter   Received notification from CoverMyMeds that prior authorization for [facility] is required/requested.   Insurance verification completed.   The patient is insured through  [facility]  .   Per test claim: PA required; PA submitted to above mentioned insurance via CoverMyMeds Key/confirmation #/EOC [id number] Status is pending

## 2023-09-25 ENCOUNTER — Other Ambulatory Visit: Payer: Self-pay

## 2023-09-25 ENCOUNTER — Telehealth: Payer: Self-pay

## 2023-09-25 LAB — THYROID PANEL WITH TSH
Free Thyroxine Index: 2 (ref 1.2–4.9)
T3 Uptake Ratio: 26 % (ref 24–39)
T4, Total: 7.7 ug/dL (ref 4.5–12.0)
TSH: 0.55 u[IU]/mL (ref 0.450–4.500)

## 2023-09-25 LAB — CBC WITH DIFFERENTIAL/PLATELET
Basophils Absolute: 0.1 10*3/uL (ref 0.0–0.2)
Basos: 1 %
EOS (ABSOLUTE): 0.2 10*3/uL (ref 0.0–0.4)
Eos: 3 %
Hematocrit: 39.5 % (ref 34.0–46.6)
Hemoglobin: 12.6 g/dL (ref 11.1–15.9)
Immature Grans (Abs): 0 10*3/uL (ref 0.0–0.1)
Immature Granulocytes: 0 %
Lymphocytes Absolute: 2.6 10*3/uL (ref 0.7–3.1)
Lymphs: 39 %
MCH: 27.9 pg (ref 26.6–33.0)
MCHC: 31.9 g/dL (ref 31.5–35.7)
MCV: 88 fL (ref 79–97)
Monocytes Absolute: 0.6 10*3/uL (ref 0.1–0.9)
Monocytes: 8 %
Neutrophils Absolute: 3.3 10*3/uL (ref 1.4–7.0)
Neutrophils: 49 %
Platelets: 355 10*3/uL (ref 150–450)
RBC: 4.51 x10E6/uL (ref 3.77–5.28)
RDW: 13.9 % (ref 11.7–15.4)
WBC: 6.7 10*3/uL (ref 3.4–10.8)

## 2023-09-25 LAB — CMP14+EGFR
ALT: 38 [IU]/L — ABNORMAL HIGH (ref 0–32)
AST: 44 [IU]/L — ABNORMAL HIGH (ref 0–40)
Albumin: 3.9 g/dL (ref 3.8–4.9)
Alkaline Phosphatase: 108 [IU]/L (ref 44–121)
BUN/Creatinine Ratio: 16 (ref 9–23)
BUN: 18 mg/dL (ref 6–24)
Bilirubin Total: 0.4 mg/dL (ref 0.0–1.2)
CO2: 20 mmol/L (ref 20–29)
Calcium: 9.8 mg/dL (ref 8.7–10.2)
Chloride: 102 mmol/L (ref 96–106)
Creatinine, Ser: 1.14 mg/dL — ABNORMAL HIGH (ref 0.57–1.00)
Globulin, Total: 2.8 g/dL (ref 1.5–4.5)
Glucose: 290 mg/dL — ABNORMAL HIGH (ref 70–99)
Potassium: 4.2 mmol/L (ref 3.5–5.2)
Sodium: 143 mmol/L (ref 134–144)
Total Protein: 6.7 g/dL (ref 6.0–8.5)
eGFR: 55 mL/min/{1.73_m2} — ABNORMAL LOW (ref >59)

## 2023-09-25 NOTE — Telephone Encounter (Signed)
Pharmacy Patient Advocate Encounter  Received notification from [facility] that Prior Authorization for [facility] has been APPROVED from 09/24/2023 to 09/23/2024

## 2023-09-27 ENCOUNTER — Encounter: Payer: Self-pay | Admitting: Nurse Practitioner

## 2023-10-01 ENCOUNTER — Encounter: Payer: Self-pay | Admitting: Nurse Practitioner

## 2023-10-02 ENCOUNTER — Encounter: Payer: Self-pay | Admitting: Nurse Practitioner

## 2023-10-05 ENCOUNTER — Other Ambulatory Visit: Payer: Self-pay

## 2023-10-09 ENCOUNTER — Other Ambulatory Visit: Payer: Self-pay

## 2023-10-16 ENCOUNTER — Inpatient Hospital Stay: Admission: RE | Admit: 2023-10-16 | Payer: Medicaid Other | Source: Ambulatory Visit

## 2023-10-22 ENCOUNTER — Ambulatory Visit
Admission: RE | Admit: 2023-10-22 | Discharge: 2023-10-22 | Disposition: A | Discharge status: Home / Self Care | Payer: Medicaid Other | Source: Ambulatory Visit | Attending: Nurse Practitioner

## 2023-10-22 DIAGNOSIS — F17200 Nicotine dependence, unspecified, uncomplicated: Secondary | ICD-10-CM

## 2023-10-22 DIAGNOSIS — J42 Unspecified chronic bronchitis: Secondary | ICD-10-CM

## 2023-10-30 ENCOUNTER — Ambulatory Visit: Payer: Medicaid Other | Admitting: Nurse Practitioner

## 2023-11-14 ENCOUNTER — Encounter: Payer: Self-pay | Admitting: Nurse Practitioner

## 2023-11-20 ENCOUNTER — Ambulatory Visit: Payer: Medicaid Other | Attending: Nurse Practitioner | Admitting: Nurse Practitioner

## 2023-12-03 ENCOUNTER — Other Ambulatory Visit: Payer: Self-pay

## 2023-12-04 ENCOUNTER — Other Ambulatory Visit: Payer: Self-pay | Admitting: Nurse Practitioner

## 2023-12-04 ENCOUNTER — Other Ambulatory Visit (HOSPITAL_COMMUNITY): Payer: Self-pay

## 2023-12-04 ENCOUNTER — Other Ambulatory Visit: Payer: Self-pay

## 2023-12-04 DIAGNOSIS — E1165 Type 2 diabetes mellitus with hyperglycemia: Secondary | ICD-10-CM

## 2023-12-05 ENCOUNTER — Other Ambulatory Visit: Payer: Self-pay

## 2023-12-05 ENCOUNTER — Other Ambulatory Visit (HOSPITAL_COMMUNITY): Payer: Self-pay

## 2023-12-05 MED ORDER — ONETOUCH VERIO VI STRP
ORAL_STRIP | 2 refills | Status: DC
  Filled 2023-12-05 (×2): qty 100, 33d supply, fill #0

## 2023-12-05 MED ORDER — ONETOUCH DELICA LANCETS 33G MISC
2 refills | Status: DC
  Filled 2023-12-05: qty 100, 30d supply, fill #0
  Filled 2023-12-05: qty 100, 33d supply, fill #0

## 2023-12-06 ENCOUNTER — Other Ambulatory Visit: Payer: Self-pay | Admitting: Pharmacist

## 2023-12-06 ENCOUNTER — Other Ambulatory Visit (HOSPITAL_COMMUNITY): Payer: Self-pay

## 2023-12-06 ENCOUNTER — Other Ambulatory Visit: Payer: Self-pay

## 2023-12-06 MED ORDER — ACCU-CHEK SOFTCLIX LANCETS MISC
0 refills | Status: DC
  Filled 2023-12-06: qty 100, fill #0
  Filled 2023-12-06: qty 100, 33d supply, fill #0

## 2023-12-06 MED ORDER — ACCU-CHEK GUIDE TEST VI STRP
ORAL_STRIP | 0 refills | Status: DC
  Filled 2023-12-06: qty 100, 30d supply, fill #0
  Filled 2023-12-06: qty 100, fill #0

## 2023-12-06 MED ORDER — ACCU-CHEK GUIDE W/DEVICE KIT
PACK | 0 refills | Status: DC
  Filled 2023-12-06: qty 1, 30d supply, fill #0
  Filled 2023-12-06: qty 1, fill #0

## 2023-12-11 ENCOUNTER — Other Ambulatory Visit (HOSPITAL_COMMUNITY): Payer: Self-pay

## 2023-12-11 ENCOUNTER — Other Ambulatory Visit: Payer: Self-pay | Admitting: Nurse Practitioner

## 2023-12-11 ENCOUNTER — Other Ambulatory Visit: Payer: Self-pay

## 2023-12-11 DIAGNOSIS — E119 Type 2 diabetes mellitus without complications: Secondary | ICD-10-CM

## 2023-12-11 DIAGNOSIS — E1142 Type 2 diabetes mellitus with diabetic polyneuropathy: Secondary | ICD-10-CM

## 2023-12-12 ENCOUNTER — Other Ambulatory Visit (HOSPITAL_COMMUNITY): Payer: Self-pay

## 2023-12-12 ENCOUNTER — Other Ambulatory Visit: Payer: Self-pay

## 2023-12-12 MED ORDER — LANTUS SOLOSTAR 100 UNIT/ML ~~LOC~~ SOPN
40.0000 [IU] | PEN_INJECTOR | Freq: Every day | SUBCUTANEOUS | 0 refills | Status: DC
  Filled 2023-12-12: qty 15, 37d supply, fill #0

## 2023-12-13 ENCOUNTER — Other Ambulatory Visit: Payer: Self-pay

## 2023-12-24 ENCOUNTER — Other Ambulatory Visit: Payer: Self-pay

## 2024-01-07 ENCOUNTER — Other Ambulatory Visit (HOSPITAL_COMMUNITY): Payer: Self-pay

## 2024-01-14 ENCOUNTER — Other Ambulatory Visit (HOSPITAL_COMMUNITY): Payer: Self-pay

## 2024-01-14 ENCOUNTER — Other Ambulatory Visit: Payer: Self-pay | Admitting: Family Medicine

## 2024-01-14 DIAGNOSIS — E1142 Type 2 diabetes mellitus with diabetic polyneuropathy: Secondary | ICD-10-CM

## 2024-01-15 ENCOUNTER — Other Ambulatory Visit: Payer: Self-pay

## 2024-01-15 ENCOUNTER — Other Ambulatory Visit (HOSPITAL_COMMUNITY): Payer: Self-pay

## 2024-01-15 MED ORDER — INSULIN GLARGINE 100 UNIT/ML SOLOSTAR PEN
40.0000 [IU] | PEN_INJECTOR | Freq: Every day | SUBCUTANEOUS | 0 refills | Status: DC
  Filled 2024-01-15: qty 15, 37d supply, fill #0

## 2024-01-15 NOTE — Telephone Encounter (Signed)
 Please call and schedule patient for appt and let her know I would not be able to fill insulin  again after this one time unless she has been seen in office.

## 2024-01-16 ENCOUNTER — Other Ambulatory Visit (HOSPITAL_COMMUNITY): Payer: Self-pay

## 2024-02-11 ENCOUNTER — Other Ambulatory Visit (HOSPITAL_COMMUNITY): Payer: Self-pay

## 2024-02-11 ENCOUNTER — Other Ambulatory Visit: Payer: Self-pay

## 2024-02-12 ENCOUNTER — Other Ambulatory Visit: Payer: Self-pay

## 2024-02-14 ENCOUNTER — Emergency Department (HOSPITAL_COMMUNITY)

## 2024-02-14 ENCOUNTER — Other Ambulatory Visit: Payer: Self-pay

## 2024-02-14 ENCOUNTER — Observation Stay (HOSPITAL_COMMUNITY)

## 2024-02-14 ENCOUNTER — Inpatient Hospital Stay (HOSPITAL_COMMUNITY)
Admission: EM | Admit: 2024-02-14 | Discharge: 2024-02-19 | DRG: 638 | Disposition: A | Discharge status: Skilled Nursing Facility | Attending: Internal Medicine | Admitting: Internal Medicine

## 2024-02-14 ENCOUNTER — Encounter (HOSPITAL_COMMUNITY): Payer: Self-pay | Admitting: Emergency Medicine

## 2024-02-14 DIAGNOSIS — Z880 Allergy status to penicillin: Secondary | ICD-10-CM

## 2024-02-14 DIAGNOSIS — Z885 Allergy status to narcotic agent status: Secondary | ICD-10-CM

## 2024-02-14 DIAGNOSIS — R159 Full incontinence of feces: Secondary | ICD-10-CM | POA: Diagnosis present

## 2024-02-14 DIAGNOSIS — F32A Depression, unspecified: Secondary | ICD-10-CM | POA: Diagnosis present

## 2024-02-14 DIAGNOSIS — N179 Acute kidney failure, unspecified: Secondary | ICD-10-CM | POA: Diagnosis present

## 2024-02-14 DIAGNOSIS — J4489 Other specified chronic obstructive pulmonary disease: Secondary | ICD-10-CM | POA: Diagnosis present

## 2024-02-14 DIAGNOSIS — E861 Hypovolemia: Secondary | ICD-10-CM | POA: Diagnosis present

## 2024-02-14 DIAGNOSIS — Z7985 Long-term (current) use of injectable non-insulin antidiabetic drugs: Secondary | ICD-10-CM

## 2024-02-14 DIAGNOSIS — Z8673 Personal history of transient ischemic attack (TIA), and cerebral infarction without residual deficits: Secondary | ICD-10-CM

## 2024-02-14 DIAGNOSIS — G4700 Insomnia, unspecified: Secondary | ICD-10-CM | POA: Diagnosis present

## 2024-02-14 DIAGNOSIS — Z803 Family history of malignant neoplasm of breast: Secondary | ICD-10-CM

## 2024-02-14 DIAGNOSIS — K7460 Unspecified cirrhosis of liver: Secondary | ICD-10-CM | POA: Diagnosis present

## 2024-02-14 DIAGNOSIS — Z90710 Acquired absence of both cervix and uterus: Secondary | ICD-10-CM

## 2024-02-14 DIAGNOSIS — R739 Hyperglycemia, unspecified: Principal | ICD-10-CM | POA: Diagnosis present

## 2024-02-14 DIAGNOSIS — E11319 Type 2 diabetes mellitus with unspecified diabetic retinopathy without macular edema: Secondary | ICD-10-CM | POA: Diagnosis present

## 2024-02-14 DIAGNOSIS — I2721 Secondary pulmonary arterial hypertension: Secondary | ICD-10-CM | POA: Diagnosis present

## 2024-02-14 DIAGNOSIS — E785 Hyperlipidemia, unspecified: Secondary | ICD-10-CM | POA: Diagnosis present

## 2024-02-14 DIAGNOSIS — N390 Urinary tract infection, site not specified: Secondary | ICD-10-CM | POA: Diagnosis present

## 2024-02-14 DIAGNOSIS — E1140 Type 2 diabetes mellitus with diabetic neuropathy, unspecified: Secondary | ICD-10-CM | POA: Diagnosis present

## 2024-02-14 DIAGNOSIS — I6381 Other cerebral infarction due to occlusion or stenosis of small artery: Secondary | ICD-10-CM | POA: Diagnosis present

## 2024-02-14 DIAGNOSIS — Z881 Allergy status to other antibiotic agents status: Secondary | ICD-10-CM

## 2024-02-14 DIAGNOSIS — F17210 Nicotine dependence, cigarettes, uncomplicated: Secondary | ICD-10-CM | POA: Diagnosis present

## 2024-02-14 DIAGNOSIS — E872 Acidosis, unspecified: Secondary | ICD-10-CM | POA: Diagnosis present

## 2024-02-14 DIAGNOSIS — K529 Noninfective gastroenteritis and colitis, unspecified: Secondary | ICD-10-CM | POA: Diagnosis present

## 2024-02-14 DIAGNOSIS — I10 Essential (primary) hypertension: Secondary | ICD-10-CM | POA: Diagnosis present

## 2024-02-14 DIAGNOSIS — E1165 Type 2 diabetes mellitus with hyperglycemia: Principal | ICD-10-CM | POA: Diagnosis present

## 2024-02-14 DIAGNOSIS — Z79899 Other long term (current) drug therapy: Secondary | ICD-10-CM

## 2024-02-14 DIAGNOSIS — E860 Dehydration: Secondary | ICD-10-CM | POA: Diagnosis present

## 2024-02-14 DIAGNOSIS — E0590 Thyrotoxicosis, unspecified without thyrotoxic crisis or storm: Secondary | ICD-10-CM | POA: Diagnosis present

## 2024-02-14 DIAGNOSIS — G319 Degenerative disease of nervous system, unspecified: Secondary | ICD-10-CM | POA: Diagnosis present

## 2024-02-14 DIAGNOSIS — Z833 Family history of diabetes mellitus: Secondary | ICD-10-CM

## 2024-02-14 DIAGNOSIS — T730XXA Starvation, initial encounter: Secondary | ICD-10-CM | POA: Diagnosis present

## 2024-02-14 DIAGNOSIS — N319 Neuromuscular dysfunction of bladder, unspecified: Secondary | ICD-10-CM | POA: Diagnosis present

## 2024-02-14 DIAGNOSIS — H547 Unspecified visual loss: Secondary | ICD-10-CM | POA: Diagnosis present

## 2024-02-14 DIAGNOSIS — Z91148 Patient's other noncompliance with medication regimen for other reason: Secondary | ICD-10-CM

## 2024-02-14 DIAGNOSIS — Z7982 Long term (current) use of aspirin: Secondary | ICD-10-CM

## 2024-02-14 DIAGNOSIS — Z794 Long term (current) use of insulin: Secondary | ICD-10-CM

## 2024-02-14 DIAGNOSIS — D320 Benign neoplasm of cerebral meninges: Secondary | ICD-10-CM | POA: Diagnosis present

## 2024-02-14 DIAGNOSIS — D329 Benign neoplasm of meninges, unspecified: Secondary | ICD-10-CM | POA: Diagnosis present

## 2024-02-14 DIAGNOSIS — Z823 Family history of stroke: Secondary | ICD-10-CM

## 2024-02-14 DIAGNOSIS — Z882 Allergy status to sulfonamides status: Secondary | ICD-10-CM

## 2024-02-14 DIAGNOSIS — R32 Unspecified urinary incontinence: Secondary | ICD-10-CM | POA: Insufficient documentation

## 2024-02-14 DIAGNOSIS — Z7951 Long term (current) use of inhaled steroids: Secondary | ICD-10-CM

## 2024-02-14 DIAGNOSIS — R2689 Other abnormalities of gait and mobility: Secondary | ICD-10-CM | POA: Diagnosis present

## 2024-02-14 DIAGNOSIS — E1139 Type 2 diabetes mellitus with other diabetic ophthalmic complication: Secondary | ICD-10-CM

## 2024-02-14 DIAGNOSIS — Z7409 Other reduced mobility: Secondary | ICD-10-CM | POA: Diagnosis present

## 2024-02-14 LAB — BETA-HYDROXYBUTYRIC ACID: Beta-Hydroxybutyric Acid: 0.05 mmol/L (ref 0.05–0.27)

## 2024-02-14 LAB — COMPREHENSIVE METABOLIC PANEL WITH GFR
ALT: 16 U/L (ref 0–44)
AST: 17 U/L (ref 15–41)
Albumin: 3.5 g/dL (ref 3.5–5.0)
Alkaline Phosphatase: 79 U/L (ref 38–126)
Anion gap: 16 — ABNORMAL HIGH (ref 5–15)
BUN: 47 mg/dL — ABNORMAL HIGH (ref 6–20)
CO2: 18 mmol/L — ABNORMAL LOW (ref 22–32)
Calcium: 9.8 mg/dL (ref 8.9–10.3)
Chloride: 97 mmol/L — ABNORMAL LOW (ref 98–111)
Creatinine, Ser: 3.37 mg/dL — ABNORMAL HIGH (ref 0.44–1.00)
GFR, Estimated: 15 mL/min — ABNORMAL LOW (ref >60)
Glucose, Bld: 623 mg/dL (ref 70–99)
Potassium: 4 mmol/L (ref 3.5–5.1)
Sodium: 131 mmol/L — ABNORMAL LOW (ref 135–145)
Total Bilirubin: 1.1 mg/dL (ref 0.0–1.2)
Total Protein: 7.1 g/dL (ref 6.5–8.1)

## 2024-02-14 LAB — CBC WITH DIFFERENTIAL/PLATELET
Abs Immature Granulocytes: 0.03 10*3/uL (ref 0.00–0.07)
Basophils Absolute: 0 10*3/uL (ref 0.0–0.1)
Basophils Relative: 1 %
Eosinophils Absolute: 0.1 10*3/uL (ref 0.0–0.5)
Eosinophils Relative: 1 %
HCT: 39.5 % (ref 36.0–46.0)
Hemoglobin: 13.3 g/dL (ref 12.0–15.0)
Immature Granulocytes: 0 %
Lymphocytes Relative: 20 %
Lymphs Abs: 1.3 10*3/uL (ref 0.7–4.0)
MCH: 29 pg (ref 26.0–34.0)
MCHC: 33.7 g/dL (ref 30.0–36.0)
MCV: 86.2 fL (ref 80.0–100.0)
Monocytes Absolute: 0.6 10*3/uL (ref 0.1–1.0)
Monocytes Relative: 9 %
Neutro Abs: 4.7 10*3/uL (ref 1.7–7.7)
Neutrophils Relative %: 69 %
Platelets: 306 10*3/uL (ref 150–400)
RBC: 4.58 MIL/uL (ref 3.87–5.11)
RDW: 12.2 % (ref 11.5–15.5)
WBC: 6.8 10*3/uL (ref 4.0–10.5)
nRBC: 0 % (ref 0.0–0.2)

## 2024-02-14 LAB — CBG MONITORING, ED
Glucose-Capillary: 477 mg/dL — ABNORMAL HIGH (ref 70–99)
Glucose-Capillary: 444 mg/dL — ABNORMAL HIGH (ref 70–99)

## 2024-02-14 LAB — URINALYSIS, W/ REFLEX TO CULTURE (INFECTION SUSPECTED)
Bilirubin Urine: NEGATIVE
Glucose, UA: >=500 mg/dL — AB
Ketones, ur: NEGATIVE mg/dL
Nitrite: NEGATIVE
Protein, ur: 30 mg/dL — AB
Specific Gravity, Urine: 1.013 (ref 1.005–1.030)
WBC, UA: >50 WBC/hpf (ref 0–5)
pH: 5 (ref 5.0–8.0)

## 2024-02-14 LAB — BLOOD GAS, VENOUS
Acid-base deficit: 1.9 mmol/L (ref 0.0–2.0)
Bicarbonate: 24.3 mmol/L (ref 20.0–28.0)
O2 Saturation: 79 %
Patient temperature: 36.8
pCO2, Ven: 46 mmHg (ref 44–60)
pH, Ven: 7.33 (ref 7.25–7.43)
pO2, Ven: 44 mmHg (ref 32–45)

## 2024-02-14 LAB — BASIC METABOLIC PANEL WITH GFR
Anion gap: 11 (ref 5–15)
BUN: 41 mg/dL — ABNORMAL HIGH (ref 6–20)
CO2: 21 mmol/L — ABNORMAL LOW (ref 22–32)
Calcium: 9.5 mg/dL (ref 8.9–10.3)
Chloride: 102 mmol/L (ref 98–111)
Creatinine, Ser: 2.3 mg/dL — ABNORMAL HIGH (ref 0.44–1.00)
GFR, Estimated: 24 mL/min — ABNORMAL LOW (ref >60)
Glucose, Bld: 317 mg/dL — ABNORMAL HIGH (ref 70–99)
Potassium: 3.4 mmol/L — ABNORMAL LOW (ref 3.5–5.1)
Sodium: 134 mmol/L — ABNORMAL LOW (ref 135–145)

## 2024-02-14 LAB — GLUCOSE, CAPILLARY
Glucose-Capillary: 186 mg/dL — ABNORMAL HIGH (ref 70–99)
Glucose-Capillary: 262 mg/dL — ABNORMAL HIGH (ref 70–99)
Glucose-Capillary: 316 mg/dL — ABNORMAL HIGH (ref 70–99)

## 2024-02-14 LAB — LIPASE, BLOOD: Lipase: 56 U/L — ABNORMAL HIGH (ref 11–51)

## 2024-02-14 LAB — HIV ANTIBODY (ROUTINE TESTING W REFLEX): HIV Screen 4th Generation wRfx: NONREACTIVE

## 2024-02-14 LAB — OSMOLALITY: Osmolality: 315 mosm/kg — ABNORMAL HIGH (ref 275–295)

## 2024-02-14 MED ORDER — ACETAMINOPHEN 650 MG RE SUPP: 650.0000 mg | Freq: Four times a day (QID) | RECTAL | Status: DC | PRN

## 2024-02-14 MED ORDER — LACTATED RINGERS IV SOLN: INTRAVENOUS | Status: AC

## 2024-02-14 MED ORDER — INSULIN ASPART 100 UNIT/ML IJ SOLN
0.0000 [IU] | INTRAMUSCULAR | Status: DC
  Administered 2024-02-14: 7 [IU] via SUBCUTANEOUS
  Administered 2024-02-14: 2 [IU] via SUBCUTANEOUS

## 2024-02-14 MED ORDER — SODIUM CHLORIDE 0.9 % IV SOLN
2.0000 g | Freq: Once | INTRAVENOUS | Status: AC
  Administered 2024-02-14: 2 g via INTRAVENOUS
  Filled 2024-02-14: qty 20

## 2024-02-14 MED ORDER — LACTATED RINGERS IV BOLUS
1000.0000 mL | Freq: Once | INTRAVENOUS | Status: AC
  Administered 2024-02-14: 1000 mL via INTRAVENOUS

## 2024-02-14 MED ORDER — INSULIN ASPART 100 UNIT/ML IJ SOLN: 0.0000 [IU] | Freq: Every day | INTRAMUSCULAR | Status: DC

## 2024-02-14 MED ORDER — POLYETHYLENE GLYCOL 3350 17 G PO PACK: 17.0000 g | PACK | Freq: Every day | ORAL | Status: DC | PRN

## 2024-02-14 MED ORDER — HEPARIN SODIUM (PORCINE) 5000 UNIT/ML IJ SOLN
5000.0000 [IU] | Freq: Three times a day (TID) | INTRAMUSCULAR | Status: DC
  Administered 2024-02-14 – 2024-02-15 (×3): 5000 [IU] via SUBCUTANEOUS
  Filled 2024-02-14 (×3): qty 1

## 2024-02-14 MED ORDER — POTASSIUM CHLORIDE CRYS ER 20 MEQ PO TBCR
40.0000 meq | EXTENDED_RELEASE_TABLET | Freq: Once | ORAL | Status: AC
  Administered 2024-02-14: 40 meq via ORAL
  Filled 2024-02-14: qty 2

## 2024-02-14 MED ORDER — INSULIN GLARGINE-YFGN 100 UNIT/ML ~~LOC~~ SOLN
40.0000 [IU] | Freq: Every day | SUBCUTANEOUS | Status: DC
  Administered 2024-02-14 – 2024-02-16 (×3): 40 [IU] via SUBCUTANEOUS
  Filled 2024-02-14 (×4): qty 0.4

## 2024-02-14 MED ORDER — ACETAMINOPHEN 325 MG PO TABS: 650.0000 mg | ORAL_TABLET | Freq: Four times a day (QID) | ORAL | Status: DC | PRN

## 2024-02-14 MED ORDER — INSULIN ASPART 100 UNIT/ML IJ SOLN
10.0000 [IU] | Freq: Once | INTRAMUSCULAR | Status: AC
  Administered 2024-02-14: 10 [IU] via SUBCUTANEOUS

## 2024-02-14 MED ORDER — INSULIN ASPART 100 UNIT/ML IJ SOLN: 0.0000 [IU] | Freq: Three times a day (TID) | INTRAMUSCULAR | Status: DC

## 2024-02-14 MED ORDER — INSULIN ASPART 100 UNIT/ML IJ SOLN
10.0000 [IU] | Freq: Once | INTRAMUSCULAR | Status: AC
  Administered 2024-02-14: 10 [IU] via INTRAVENOUS

## 2024-02-14 MED ORDER — POTASSIUM CHLORIDE 10 MEQ/100ML IV SOLN
10.0000 meq | INTRAVENOUS | Status: DC
  Administered 2024-02-14: 10 meq via INTRAVENOUS
  Filled 2024-02-14 (×2): qty 100

## 2024-02-14 NOTE — ED Notes (Signed)
 Verbal in and out cath order given by Cathey Clunes MD.

## 2024-02-14 NOTE — H&P (Cosign Needed Addendum)
 Date: 02/14/2024         Patient Name:  Barbara Wang MRN: [id number]  DOB: 07/02/1966 Age / Sex: 58 y.o., female   PCP: Collins Dean, NP         Medical Service: Internal Medicine Teaching Service         Attending Physician: Dr. Broadus Canes Whitney Hams, MD     First Contact: Dr. Marni Sins.     Second Contact: Dr. Garald Jumbo         After Hours (After 5p/  First Contact Pager: [phone]  weekends / holidays): Second Contact Pager: [phone]   Chief Concern: Generalized weakness  History of Present Illness: Barbara Wang is a 58 year old female with a past medical history significant for uncontrolled insulin -dependent T2DM, hypertension, who presents with 4 days of progressively worsening generalized weakness. She reports associated polyuria and polydipsia over the past several days. She notes her BG this week have been consistently >300 mg/dL, though she does not check her glucose regularly. She admits to intermittent non-adherence with her insulin  regimen. She denies any recent upper respiratory symptoms, fever, chills, or known sick contacts.  She reports mild, diffuse abdominal discomfort but denies nausea, vomiting, or diarrhea. Her appetite remains intact, but she has not had anything to eat today.  Of note, she has a history of chronic urinary and fecal incontinence over the past year, with unclear etiology. She also had staggering gait for about a year, no recent falls. She denies chest pain, palpitations, or altered mental status at this time.  Upon EMS arrival, the patient was found to be hypotensive, tachycardic, and had a capillary blood glucose of 560 mg/dL.  Allergies: Allergies  Allergen Reactions   Penicillins Hives and Shortness Of Breath   Azithromycin Hives   Erythromycin Base Hives   Sulfa Antibiotics Hives   Tramadol  Hives   *Pertinent insulin -dependent type 2 diabetes, hypertension and hyperlipidemia. Past Medical History: Past  Medical History:  Diagnosis Date   Asthma    Brain tumor (HCC)    Cellulitis 06/2015   rt hand    Depression    History of hiatal hernia    it went away on it's own   Hyperlipemia    Hypertension    Neuropathy     feet & hands    Obesity    Pancreatitis    Type II diabetes mellitus (HCC)    insulin  dependent      Medications: ** Insulin  40 units Humalog  12units TID Ozempic  0.25mg   Lisinopril -hydrochlorothiazide  20-25 mg Sertraline  50 mg  Crestor 40 mg  ASA 81 mg   Current Outpatient Medications  Medication Instructions   Accu-Chek Softclix Lancets lancets Use to check blood sugar 3 times daily.   albuterol  (PROAIR  HFA) 108 (90 Base) MCG/ACT inhaler 1-2 puffs, Inhalation, Every 6 hours PRN   albuterol  (PROVENTIL ) 2.5 mg, Nebulization, Every 6 hours PRN   Aspirin  Low Dose 81 mg, Oral, Daily   atorvastatin  (LIPITOR) 40 mg, Oral, Daily-1800   Basaglar  KwikPen 40 Units, Subcutaneous, Daily, Must make office visit for refills   Blood Glucose Monitoring Suppl (ACCU-CHEK GUIDE) w/Device KIT Use to check blood sugar 3 times daily.   budesonide -formoterol  (SYMBICORT ) 160-4.5 MCG/ACT inhaler 2 puffs, Inhalation, 2 times daily   chlorhexidine  (PERIDEX ) 0.12 % solution 15 mLs, Mouth/Throat, 2 times daily   fluticasone -salmeterol (ADVAIR  HFA) 230-21 MCG/ACT inhaler 2 puffs, Inhalation, 2 times daily   gabapentin  (NEURONTIN ) 600 mg, Oral, Daily PRN   glucose  blood (ACCU-CHEK GUIDE TEST) test strip Use to check blood sugar 3 times daily.   guaifenesin  (HUMIBID E) 400 mg, Oral, Every 4 hours PRN   lisinopril -hydrochlorothiazide  (ZESTORETIC ) 20-25 MG tablet 1 tablet, Oral, Daily   mometasone  (ELOCON ) 0.1 % cream Topical, Daily   omega-3 acid ethyl esters (LOVAZA ) 1 g, Oral, Daily   Ozempic  (1 MG/DOSE) 1 mg, Subcutaneous, Weekly   pantoprazole  (PROTONIX ) 40 mg, Oral, Daily   potassium chloride  SA (KLOR-CON  M) 20 MEQ tablet 20 mEq, Oral, 2 times daily   sertraline  (ZOLOFT ) 100 MG  tablet Take 1 tablet (100 mg total) by mouth daily FOR DEPRESSION.   Simethicone  (GAS RELIEF) 125 mg, Oral, Every 6 hours PRN   trazodone  (DESYREL ) 300 mg, Oral, Daily at bedtime    Surgical History: Past Surgical History:  Procedure Laterality Date   ABDOMINAL HYSTERECTOMY     BREAST CYST EXCISION Right 10+ yrs ago   abscess removed   CESAREAN SECTION  1981; 1987   CHOLECYSTECTOMY  05/11/2012   Procedure: LAPAROSCOPIC CHOLECYSTECTOMY WITH INTRAOPERATIVE CHOLANGIOGRAM;  Surgeon: Fran Imus, MD,FACS;  Location: [facility] OR;  Service: General;  Laterality: N/A;   SHOULDER ARTHROSCOPY W/ ROTATOR CUFF REPAIR Right    TUBAL LIGATION  10/25/1999   Maximo Spar 01/11/2011    Family History:  Family History  Problem Relation Age of Onset   Stroke Mother    Breast cancer Mother 41   Diabetes Father    Diabetes Sister    Breast cancer Maternal Aunt    Breast cancer Maternal Aunt    Breast cancer Maternal Grandmother     Social History:  Lives [place] with daughter.  Daughter helps with ADL, she walks with a cane, she is not walking much, has a staggering gait. Smokes half pack of cigarettes, about 30-pack-year history.  Denies cocaine or other recreational drug use. PCP: Collins Dean, NP  Physical Exam:  Blood pressure 109/66, pulse (!) 113, temperature 97.9 F (36.6 C), temperature source Oral, resp. rate 18, height 5' 4 (1.626 m), weight 76.7 kg, SpO2 100%.   Pleasant, lying in bed, NAD. Dry mucous membrane, decrease skin turgor. Tachycardic rate, normal rhythm, no murmurs. LLQ tender to deep palpation, no involuntary guarding.  No suprapubic tenderness. + CVA tenderness. Skin warm and dry. Mood and affect appropriate. Neuro: Alert and oriented x 3, no focal weakness.  Labs:     Latest Ref Rng & Units 02/14/2024   11:21 AM 09/24/2023   11:26 AM 08/26/2023    4:04 AM  CBC  WBC 4.0 - 10.5 K/uL 6.8  6.7  6.5   Hemoglobin 12.0 - 15.0 g/dL 16.1  09.6  04.5   Hematocrit 36.0 -  46.0 % 39.5  39.5  35.5   Platelets 150 - 400 K/uL 306  355  283         Latest Ref Rng & Units 02/14/2024   11:21 AM 09/24/2023   11:26 AM 08/26/2023    4:04 AM  CMP  Glucose 70 - 99 mg/dL 409  811  914   BUN 6 - 20 mg/dL 47  18  15   Creatinine 0.44 - 1.00 mg/dL 7.82  9.56  2.13   Sodium 135 - 145 mmol/L 131  143  136   Potassium 3.5 - 5.1 mmol/L 4.0  4.2  3.6   Chloride 98 - 111 mmol/L 97  102  102   CO2 22 - 32 mmol/L 18  20  24    Calcium   8.9 - 10.3 mg/dL 9.8  9.8  8.8   Total Protein 6.5 - 8.1 g/dL 7.1  6.7    Total Bilirubin 0.0 - 1.2 mg/dL 1.1  0.4    Alkaline Phos 38 - 126 U/L 79  108    AST 15 - 41 U/L 17  44    ALT 0 - 44 U/L 16  38       Images and other studies: DG Chest Port 1 View Result Date: 02/14/2024 CLINICAL DATA:  Weakness EXAM: PORTABLE CHEST 1 VIEW COMPARISON:  08/23/2023 FINDINGS: Single frontal view of the chest demonstrates a stable cardiac silhouette. No acute airspace disease, effusion, or pneumothorax. No acute bony abnormalities. IMPRESSION: 1. Stable chest, no acute process. Electronically Signed   By: Bobbye Burrow M.D.   On: 02/14/2024 13:04      EKG:  Sinus tachycardia.  No ST elevation or new T wave inversion as compared to prior EKG.  Assessment & Plan:  Zamarah MYLDRED RAJU is a 58 y.o. female with PMHx of uncontrolled insulin -dependent type 2 diabetes, hypertension, hyperlipidemia presented to the ED with generalized weakness, and admitted for hyperglycemia.  Principal Problem:   Hyperglycemia  # Hyperglycemia # DKA/HHS. # Metabolic acidosis # Pseudohyponatremia Patient presenting with generalized weakness, BG > 600, and elevated anion gap metabolic acidosis.  No fevers or leukocytosis.  Normal chest x-ray.  Her urinary ketones were negative.  UA with pyuria and hematuria. Common etiologies for hyperglycemia includes DKA vs. HHS.  Medication induced hyperglycemia also in the differential less likely as she denies any new medications  known to cause hyperglycemia.  Not endorsing chest pain, and EKG not concerning for infarction.Pseudohyponatremia is due to significant hyperglycemia.  Corrected Na within normal range.  On exam, she has dry mucous membrane, right CVA tenderness and lower quadrant tender to deep palpation. I think metabolic acidosis is due to dehydration/starvation and possible DKA.   Given the abdominal exam, will need to rule out intra-abdominal infection, I query if she has pyelonephritis.  Will get a renal CT, and treat empirically with IV ceftriaxone .  - NPO - SSI, and BG checks every 4 hours. - Cardiac monitoring. - lactic acids - IV fluids  - Continue SQ insulin  until AG normalizes.  # AKI Likely prerenal in the setting of polyuria due to hyperglycemia.  Baseline SCr 1.0-1.2.  SCr today is 3.3.  Low suspicion for intrarenal etiology based on history and labs.  She has a history of neurogenic bladder, will get bladder ultrasound to rule out renal urinary obstruction. - IV hydration as above - Avoid nephrotoxic drugs. - BMP.  # Uncontrolled type 2 diabetes A1c 9.56 months ago.  At home, she is on Lantus  40 units twice daily, and Humalog  12 units 3 times daily, and Ozempic  0.25 mg/week.  She intermittently forgets to take medicines as prescribed. - Will hold home insulin  regimen. -  SSI .  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  Since this new finding, has not followed up with neurology, and there has not been any new further workup. I suspect this likely is contributing to her overall cognition. - Will order TSH, B12, folate and vitamin B1. - CT of the head w/o contrast.  - Aspirin  81 mg daily.  #  Essential Hypertension. Softer  blood pressure, has improved with IV hydration.  At home, she is on Lisinopril -HCTZ 20-25 mg tablet. - Holding antihypertensive due to AKI.  #Pulmonary hypertension CT 06/24 showed enlarged pulmonary trunk indicative of pulm arterial hypertension.  I suspect this is from the underlying lung disease.  Could consider outpatient pulmonology follow-up.  Chronic medical conditions:  Depression: On sertraline  50 mg daily. Hyperlipidemia: Sertraline  50 mg. Chronic bronchitis: On Symbicort , albuterol , and Advair . Insomnia: Trazodone  300 mg.  Diet: NPO VTE: Heparin  IVF: LR Code: Full  Signed: Marni Sins, MD 02/14/2024, 5:06 PM

## 2024-02-14 NOTE — ED Notes (Signed)
 Please update Daughter Sinda Duel # under emergency contacts

## 2024-02-14 NOTE — ED Notes (Signed)
 PT CBG was 444.Physician was notified.

## 2024-02-14 NOTE — ED Provider Notes (Addendum)
 [place] EMERGENCY DEPARTMENT AT [facility] Provider Note   CSN: [id number] Arrival date & time: 02/14/24  1111     Patient presents with: No chief complaint on file.   Barbara Wang is a 58 y.o. female.   Pt is a 58y/o female with hx of  insulin -dependent type 2 diabetes, hypertension, hyperlipidemia, asthma, pancreatitis, chronic bronchitis, GERD, anemia, CVA who is presenting today with generalized weakness for the last 4 days.   She reports it is just generally getting worse.  When she got up today and tried walking she felt like she was staggering a little bit.  She denies any syncope or near syncope.  She denies feeling dizzy.  She has ongoing issues with diarrhea which she reports is not new her stools have not looked bloody to her.  She denies any nausea or vomiting.  She has been eating and drinking.  She reports compliance with her medication but she has not taken them today.  Reports normally her blood sugars less than 300.  She has not had any medication changes.  She does report some mild shortness of breath and a dry cough.  She reports the dry cough is not new.  She has not had a fever or swelling in her legs.  She denies any urinary symptoms.  EMS report upon their arrival patient's blood sugar was in the 500s.  They also note she was hypotensive on arrival where her blood pressure was in the 50s systolic.  They were unable to get an IV but recurrent blood pressures were improved and most recent 1 was in the low 100s.  Patient reports she had been sitting outside for approximately 2 hours in the shade prior to their arrival.  The history is provided by the patient, the EMS personnel and medical records.       Prior to Admission medications   Medication Sig Start Date End Date Taking? Authorizing Provider  Accu-Chek Softclix Lancets lancets Use to check blood sugar 3 times daily. 12/06/23   Newlin, Enobong, MD  albuterol  (PROAIR  HFA) 108 (90 Base) MCG/ACT  inhaler Inhale 1-2 puffs into the lungs every 6 (six) hours as needed for wheezing or shortness of breath. 07/31/23   Fleming, Zelda W, NP  albuterol  (PROVENTIL ) (2.5 MG/3ML) 0.083% nebulizer solution Take 3 mLs (2.5 mg total) by nebulization every 6 (six) hours as needed for wheezing or shortness of breath. 07/31/23   Fleming, Zelda W, NP  aspirin  EC 81 MG tablet Take 1 tablet (81 mg total) by mouth daily. 09/24/23   Fleming, Zelda W, NP  atorvastatin  (LIPITOR) 40 MG tablet Take 1 tablet (40 mg total) by mouth daily at 6 PM. 09/24/23   Fleming, Zelda W, NP  Blood Glucose Monitoring Suppl (ACCU-CHEK GUIDE) w/Device KIT Use to check blood sugar 3 times daily. 12/06/23   Newlin, Enobong, MD  budesonide -formoterol  (SYMBICORT ) 160-4.5 MCG/ACT inhaler Inhale 2 puffs into the lungs 2 (two) times daily. Patient not taking: Reported on 08/25/2023 07/31/23   Fleming, Zelda W, NP  chlorhexidine  (PERIDEX ) 0.12 % solution Use as directed 15 mLs in the mouth or throat 2 (two) times daily. Patient not taking: Reported on 08/25/2023 09/29/22   Fleming, Zelda W, NP  fluticasone -salmeterol (ADVAIR  HFA) 230-21 MCG/ACT inhaler Inhale 2 puffs into the lungs 2 (two) times daily. 07/31/23   Fleming, Zelda W, NP  gabapentin  (NEURONTIN ) 600 MG tablet Take 1 tablet (600 mg total) by mouth daily as needed (for neuropathy in hands  and feet). 09/29/22   Fleming, Zelda W, NP  glucose blood (ACCU-CHEK GUIDE TEST) test strip Use to check blood sugar 3 times daily. 12/06/23   Newlin, Enobong, MD  guaifenesin  (HUMIBID E) 400 MG TABS tablet Take 400 mg by mouth every 4 (four) hours as needed (for coughing or to thin mucus (phlegm) in the lungs).    [provider]  insulin  glargine (LANTUS ) 100 UNIT/ML Solostar Pen Inject 40 Units into the skin daily. Must make office visit for refills 01/15/24   Collins Dean, NP  lisinopril -hydrochlorothiazide  (ZESTORETIC ) 20-25 MG tablet Take 1 tablet by mouth daily. 09/24/23   Fleming, Zelda W, NP   mometasone  (ELOCON ) 0.1 % cream Apply topically daily. 07/31/23   Fleming, Zelda W, NP  omega-3 acid ethyl esters (LOVAZA ) 1 g capsule Take 1 capsule (1 g total) by mouth daily. 09/24/23   Fleming, Zelda W, NP  pantoprazole  (PROTONIX ) 40 MG tablet Take 1 tablet (40 mg total) by mouth daily. 09/24/23   Fleming, Zelda W, NP  potassium chloride  SA (KLOR-CON  M) 20 MEQ tablet Take 1 tablet (20 mEq total) by mouth 2 (two) times daily. 09/24/23   Collins Dean, NP  Semaglutide , 1 MG/DOSE, 4 MG/3ML SOPN Inject 1 mg into the skin once a week. 09/24/23   Fleming, Zelda W, NP  sertraline  (ZOLOFT ) 100 MG tablet Take 1 tablet (100 mg total) by mouth daily FOR DEPRESSION. 09/24/23   Collins Dean, NP  Simethicone  (GAS RELIEF) 125 MG CAPS Take 125 mg by mouth every 6 (six) hours as needed (for gas).    [provider]  trazodone  (DESYREL ) 300 MG tablet Take 1 tablet (300 mg total) by mouth at bedtime. 09/01/23   Fleming, Zelda W, NP  insulin  NPH Human (HUMULIN  N) 100 UNIT/ML injection Inject 50 Units into the skin 2 times daily with meals for 30 days. (discard after 31 days) 09/23/21 10/06/21      Allergies: Penicillins, Azithromycin, Erythromycin base, Sulfa antibiotics, and Tramadol     Review of Systems  Updated Vital Signs BP 109/66   Pulse (!) 113   Temp 97.9 F (36.6 C) (Oral)   Resp 18   Ht 5' 4 (1.626 m)   Wt 76.7 kg   SpO2 100%   BMI 29.01 kg/m   Physical Exam Vitals and nursing note reviewed.  Constitutional:      General: She is not in acute distress.    Appearance: She is well-developed.  HENT:     Head: Normocephalic and atraumatic.     Mouth/Throat:     Mouth: Mucous membranes are dry.   Eyes:     Pupils: Pupils are equal, round, and reactive to light.    Cardiovascular:     Rate and Rhythm: Normal rate and regular rhythm.     Heart sounds: Normal heart sounds. No murmur heard.    No friction rub.  Pulmonary:     Effort: Pulmonary effort is normal.     Breath  sounds: Normal breath sounds. No wheezing or rales.  Abdominal:     General: Bowel sounds are normal. There is no distension.     Palpations: Abdomen is soft.     Tenderness: There is abdominal tenderness in the periumbilical area. There is no guarding or rebound.     Comments: Well-healed midline abdominal scar.   Musculoskeletal:        General: No tenderness. Normal range of motion.     Comments: No edema  Skin:    General: Skin is warm and dry.     Findings: No rash.   Neurological:     General: No focal deficit present.     Mental Status: She is alert and oriented to person, place, and time. Mental status is at baseline.     Cranial Nerves: No cranial nerve deficit or facial asymmetry.     Sensory: Sensation is intact.     Motor: Motor function is intact. No pronator drift.     Coordination: Heel to Shin Test normal.     Comments: 5 out of 5 strength in bilateral upper and lower extremities.  Psychiatric:        Behavior: Behavior normal.     (all labs ordered are listed, but only abnormal results are displayed) Labs Reviewed  COMPREHENSIVE METABOLIC PANEL WITH GFR - Abnormal; Notable for the following components:      Result Value   Sodium 131 (*)    Chloride 97 (*)    CO2 18 (*)    Glucose, Bld 623 (*)    BUN 47 (*)    Creatinine, Ser 3.37 (*)    GFR, Estimated 15 (*)    Anion gap 16 (*)    All other components within normal limits  LIPASE, BLOOD - Abnormal; Notable for the following components:   Lipase 56 (*)    All other components within normal limits  CBC WITH DIFFERENTIAL/PLATELET  URINALYSIS, W/ REFLEX TO CULTURE (INFECTION SUSPECTED)  CBG MONITORING, ED    EKG: EKG Interpretation Date/Time:  Thursday February 14 2024 11:28:17 EDT Ventricular Rate:  115 PR Interval:  175 QRS Duration:  91 QT Interval:  328 QTC Calculation: 454 R Axis:   83  Text Interpretation: Sinus tachycardia Borderline right axis deviation No significant change since last  tracing Confirmed by Almond Army ([id number]) on 02/14/2024 12:45:08 PM  Radiology: Lenell Query Chest Port 1 View Result Date: 02/14/2024 CLINICAL DATA:  Weakness EXAM: PORTABLE CHEST 1 VIEW COMPARISON:  08/23/2023 FINDINGS: Single frontal view of the chest demonstrates a stable cardiac silhouette. No acute airspace disease, effusion, or pneumothorax. No acute bony abnormalities. IMPRESSION: 1. Stable chest, no acute process. Electronically Signed   By: Bobbye Burrow M.D.   On: 02/14/2024 13:04     Procedures   Medications Ordered in the ED  lactated ringers  bolus 1,000 mL (has no administration in time range)  insulin  aspart (novoLOG ) injection 10 Units (has no administration in time range)  lactated ringers  bolus 1,000 mL (1,000 mLs Intravenous New Bag/Given 02/14/24 1139)                                    Medical Decision Making Amount and/or Complexity of Data Reviewed External Data Reviewed: notes. Labs: ordered. Decision-making details documented in ED Course. Radiology: ordered and independent interpretation performed. Decision-making details documented in ED Course. ECG/medicine tests: ordered and independent interpretation performed. Decision-making details documented in ED Course.  Risk Prescription drug management. Decision regarding hospitalization.   Pt with multiple medical problems and comorbidities and presenting today with a complaint that caries a high risk for morbidity and mortality.  Here today with generalized weakness and initially was hypotensive with EMS.  Concern for sepsis, dehydration, AKI, pneumonia, electrolyte abnormalities, anemia, atypical ACS.  Lower suspicion for DKA at this time.  Patient given IV fluids.  No focal findings on exam concerning for stroke at this time and  story is not consistent.  2:07 PM I independently interpreted patient's labs and EKG.  EKG was sinus tachycardia without other acute findings.  CBC within normal limits, CMP with  hyperglycemia with a blood sugar of 623 and recurrent AKI with creatinine of 3.37 with anion gap of 16.  Lipase is minimally elevated at 56.  I have independently visualized and interpreted pt's images today.  Chest x-ray within normal limits.  On repeat evaluation patient has had 1 L of fluid with improvement of her blood pressure.  Concerned that AKI is related to dehydration and hyperglycemia.  Repeat blood sugar will be done and patient will be given insulin  but low suspicion for DKA at this time.  Will admit for further hydration and management of her hyperglycemia.     Final diagnoses:  Hyperglycemia  AKI (acute kidney injury) [facility])  Dehydration    ED Discharge Orders     None          Almond Army, MD 02/14/24 1407    Almond Army, MD 02/14/24 1407

## 2024-02-14 NOTE — ED Notes (Signed)
 Patient transported to CT

## 2024-02-14 NOTE — ED Triage Notes (Signed)
 Pt [facility] for Generalized weakness x 4 days.  Pt is hyperglycemic and hypotensive on scene.  Pt and daughter noticed she was staggering some.  Neg stroke screen for EMS. Pt was outside and endorses sitting outside in shade for 2 hrs prior to EMS arrival.  Pt has freq. Urination and loose BM's at baseline  Initial CBG 563. Initial SBP 56, improved to 110 en route with IV attempts.  HR 117, 100% RA.  Hx DM, HTN, Anxiety, did not take morning meds this AM.

## 2024-02-15 ENCOUNTER — Other Ambulatory Visit (HOSPITAL_COMMUNITY): Payer: Self-pay

## 2024-02-15 ENCOUNTER — Telehealth (HOSPITAL_COMMUNITY): Payer: Self-pay | Admitting: Pharmacy Technician

## 2024-02-15 DIAGNOSIS — R739 Hyperglycemia, unspecified: Secondary | ICD-10-CM | POA: Diagnosis present

## 2024-02-15 DIAGNOSIS — I2721 Secondary pulmonary arterial hypertension: Secondary | ICD-10-CM | POA: Diagnosis present

## 2024-02-15 DIAGNOSIS — E872 Acidosis, unspecified: Secondary | ICD-10-CM | POA: Diagnosis present

## 2024-02-15 DIAGNOSIS — F17210 Nicotine dependence, cigarettes, uncomplicated: Secondary | ICD-10-CM | POA: Diagnosis present

## 2024-02-15 DIAGNOSIS — F32A Depression, unspecified: Secondary | ICD-10-CM | POA: Diagnosis present

## 2024-02-15 DIAGNOSIS — N179 Acute kidney failure, unspecified: Secondary | ICD-10-CM | POA: Diagnosis present

## 2024-02-15 DIAGNOSIS — Z794 Long term (current) use of insulin: Secondary | ICD-10-CM | POA: Diagnosis not present

## 2024-02-15 DIAGNOSIS — K7460 Unspecified cirrhosis of liver: Secondary | ICD-10-CM | POA: Diagnosis present

## 2024-02-15 DIAGNOSIS — E785 Hyperlipidemia, unspecified: Secondary | ICD-10-CM | POA: Diagnosis present

## 2024-02-15 DIAGNOSIS — Z91148 Patient's other noncompliance with medication regimen for other reason: Secondary | ICD-10-CM | POA: Diagnosis not present

## 2024-02-15 DIAGNOSIS — E1140 Type 2 diabetes mellitus with diabetic neuropathy, unspecified: Secondary | ICD-10-CM | POA: Diagnosis present

## 2024-02-15 DIAGNOSIS — G319 Degenerative disease of nervous system, unspecified: Secondary | ICD-10-CM | POA: Diagnosis present

## 2024-02-15 DIAGNOSIS — Z7951 Long term (current) use of inhaled steroids: Secondary | ICD-10-CM | POA: Diagnosis not present

## 2024-02-15 DIAGNOSIS — E0590 Thyrotoxicosis, unspecified without thyrotoxic crisis or storm: Secondary | ICD-10-CM | POA: Diagnosis present

## 2024-02-15 DIAGNOSIS — Z79899 Other long term (current) drug therapy: Secondary | ICD-10-CM | POA: Diagnosis not present

## 2024-02-15 DIAGNOSIS — E860 Dehydration: Secondary | ICD-10-CM | POA: Diagnosis present

## 2024-02-15 DIAGNOSIS — Z833 Family history of diabetes mellitus: Secondary | ICD-10-CM | POA: Diagnosis not present

## 2024-02-15 DIAGNOSIS — E119 Type 2 diabetes mellitus without complications: Secondary | ICD-10-CM | POA: Diagnosis not present

## 2024-02-15 DIAGNOSIS — Z7985 Long-term (current) use of injectable non-insulin antidiabetic drugs: Secondary | ICD-10-CM | POA: Diagnosis not present

## 2024-02-15 DIAGNOSIS — G4700 Insomnia, unspecified: Secondary | ICD-10-CM | POA: Diagnosis present

## 2024-02-15 DIAGNOSIS — D320 Benign neoplasm of cerebral meninges: Secondary | ICD-10-CM | POA: Diagnosis present

## 2024-02-15 DIAGNOSIS — J4489 Other specified chronic obstructive pulmonary disease: Secondary | ICD-10-CM | POA: Diagnosis present

## 2024-02-15 DIAGNOSIS — E11319 Type 2 diabetes mellitus with unspecified diabetic retinopathy without macular edema: Secondary | ICD-10-CM | POA: Diagnosis present

## 2024-02-15 DIAGNOSIS — N319 Neuromuscular dysfunction of bladder, unspecified: Secondary | ICD-10-CM | POA: Diagnosis present

## 2024-02-15 DIAGNOSIS — I10 Essential (primary) hypertension: Secondary | ICD-10-CM | POA: Diagnosis present

## 2024-02-15 DIAGNOSIS — N390 Urinary tract infection, site not specified: Secondary | ICD-10-CM | POA: Diagnosis present

## 2024-02-15 DIAGNOSIS — E1165 Type 2 diabetes mellitus with hyperglycemia: Secondary | ICD-10-CM | POA: Diagnosis present

## 2024-02-15 LAB — URINE CULTURE

## 2024-02-15 LAB — BASIC METABOLIC PANEL WITH GFR
Anion gap: 10 (ref 5–15)
Anion gap: 12 (ref 5–15)
BUN: 32 mg/dL — ABNORMAL HIGH (ref 6–20)
BUN: 38 mg/dL — ABNORMAL HIGH (ref 6–20)
CO2: 22 mmol/L (ref 22–32)
CO2: 25 mmol/L (ref 22–32)
Calcium: 9.5 mg/dL (ref 8.9–10.3)
Calcium: 9.7 mg/dL (ref 8.9–10.3)
Chloride: 103 mmol/L (ref 98–111)
Chloride: 106 mmol/L (ref 98–111)
Creatinine, Ser: 1.69 mg/dL — ABNORMAL HIGH (ref 0.44–1.00)
Creatinine, Ser: 1.92 mg/dL — ABNORMAL HIGH (ref 0.44–1.00)
GFR, Estimated: 29 mL/min — ABNORMAL LOW (ref >60)
GFR, Estimated: 34 mL/min — ABNORMAL LOW (ref >60)
Glucose, Bld: 198 mg/dL — ABNORMAL HIGH (ref 70–99)
Glucose, Bld: 84 mg/dL (ref 70–99)
Potassium: 3.3 mmol/L — ABNORMAL LOW (ref 3.5–5.1)
Potassium: 4.2 mmol/L (ref 3.5–5.1)
Sodium: 137 mmol/L (ref 135–145)
Sodium: 141 mmol/L (ref 135–145)

## 2024-02-15 LAB — GLUCOSE, CAPILLARY
Glucose-Capillary: 111 mg/dL — ABNORMAL HIGH (ref 70–99)
Glucose-Capillary: 111 mg/dL — ABNORMAL HIGH (ref 70–99)
Glucose-Capillary: 206 mg/dL — ABNORMAL HIGH (ref 70–99)
Glucose-Capillary: 276 mg/dL — ABNORMAL HIGH (ref 70–99)
Glucose-Capillary: 259 mg/dL — ABNORMAL HIGH (ref 70–99)

## 2024-02-15 LAB — CBC
HCT: 36.9 % (ref 36.0–46.0)
Hemoglobin: 12.8 g/dL (ref 12.0–15.0)
MCH: 29.2 pg (ref 26.0–34.0)
MCHC: 34.7 g/dL (ref 30.0–36.0)
MCV: 84.1 fL (ref 80.0–100.0)
Platelets: 327 10*3/uL (ref 150–400)
RBC: 4.39 MIL/uL (ref 3.87–5.11)
RDW: 12 % (ref 11.5–15.5)
WBC: 7.5 10*3/uL (ref 4.0–10.5)
nRBC: 0 % (ref 0.0–0.2)

## 2024-02-15 LAB — URINALYSIS, W/ REFLEX TO CULTURE (INFECTION SUSPECTED)
Bilirubin Urine: NEGATIVE
Glucose, UA: 150 mg/dL — AB
Ketones, ur: NEGATIVE mg/dL
Nitrite: NEGATIVE
Protein, ur: 30 mg/dL — AB
Specific Gravity, Urine: 1.012 (ref 1.005–1.030)
WBC, UA: >50 WBC/hpf (ref 0–5)
pH: 5 (ref 5.0–8.0)

## 2024-02-15 LAB — FOLATE: Folate: 11.1 ng/mL (ref >5.9)

## 2024-02-15 LAB — TSH: TSH: 0.227 u[IU]/mL — ABNORMAL LOW (ref 0.350–4.500)

## 2024-02-15 LAB — VITAMIN D 25 HYDROXY (VIT D DEFICIENCY, FRACTURES): Vit D, 25-Hydroxy: 35.9 ng/mL (ref 30–100)

## 2024-02-15 LAB — VITAMIN B12: Vitamin B-12: 851 pg/mL (ref 180–914)

## 2024-02-15 LAB — T4, FREE: Free T4: 1.08 ng/dL (ref 0.61–1.12)

## 2024-02-15 LAB — LACTIC ACID, PLASMA: Lactic Acid, Venous: 1.4 mmol/L (ref 0.5–1.9)

## 2024-02-15 MED ORDER — ENOXAPARIN SODIUM 40 MG/0.4ML IJ SOSY
40.0000 mg | PREFILLED_SYRINGE | INTRAMUSCULAR | Status: DC
  Administered 2024-02-15 – 2024-02-19 (×5): 40 mg via SUBCUTANEOUS
  Filled 2024-02-15 (×5): qty 0.4

## 2024-02-15 MED ORDER — INSULIN ASPART 100 UNIT/ML IJ SOLN
0.0000 [IU] | Freq: Three times a day (TID) | INTRAMUSCULAR | Status: DC
  Administered 2024-02-15: 3 [IU] via SUBCUTANEOUS
  Administered 2024-02-15: 2 [IU] via SUBCUTANEOUS

## 2024-02-15 MED ORDER — SODIUM CHLORIDE 0.9 % IV SOLN
2.0000 g | INTRAVENOUS | Status: AC
  Administered 2024-02-15 – 2024-02-16 (×2): 2 g via INTRAVENOUS
  Filled 2024-02-15 (×2): qty 20

## 2024-02-15 MED ORDER — POTASSIUM CHLORIDE CRYS ER 20 MEQ PO TBCR
40.0000 meq | EXTENDED_RELEASE_TABLET | Freq: Once | ORAL | Status: AC
  Administered 2024-02-15: 40 meq via ORAL
  Filled 2024-02-15: qty 2

## 2024-02-15 MED ORDER — INSULIN ASPART 100 UNIT/ML IJ SOLN
0.0000 [IU] | Freq: Every day | INTRAMUSCULAR | Status: DC
  Administered 2024-02-15: 3 [IU] via SUBCUTANEOUS

## 2024-02-15 MED ORDER — LIVING WELL WITH DIABETES BOOK
Freq: Once | Status: DC
  Filled 2024-02-15: qty 1

## 2024-02-15 MED ORDER — ALUM & MAG HYDROXIDE-SIMETH 200-200-20 MG/5ML PO SUSP
15.0000 mL | Freq: Four times a day (QID) | ORAL | Status: DC | PRN
  Administered 2024-02-15 – 2024-02-16 (×2): 15 mL via ORAL
  Filled 2024-02-15 (×2): qty 30

## 2024-02-15 NOTE — Progress Notes (Cosign Needed)
 HD#1 Subjective:  Barbara Wang 58 year old female with a history of uncontrolled insulin -dependent type 2 diabetes, hypertension, chronic bronchitis, hyperlipidemia, who presented with generalized weakness and admitted for hyperglycemia.  Overnight Events: No acute events overnight.  Elevated blood glucose resolved with IV fluids and SSI. She was transition from n.p.o. to normal diet.  Examined at the bedside, she says she is feeling better.  She tolerated her morning breakfast without nausea/vomiting.  Objective:  Vital signs in last 24 hours: Vitals:   02/14/24 1953 02/15/24 0405 02/15/24 0855 02/15/24 1343  BP: (!) 155/89 125/83 98/60 97/68   Pulse: 95 87 93 (!) 117  Resp: 20 (!) 21 20 20   Temp: 98.2 F (36.8 C) 97.9 F (36.6 C) 97.7 F (36.5 C) 97.7 F (36.5 C)  TempSrc: Oral Oral Oral Oral  SpO2: 100% 100% 94% 100%  Weight:      Height:       Supplemental O2: Room Air SpO2: 100 %  Physical Exam:   Pleasant woman, not in acute distress. Heart regular rate, no murmurs. LUQ abdomen less tender, normal bowel sounds.  No involuntary guarding.  Filed Weights   02/14/24 1151  Weight: 76.7 kg     Intake/Output Summary (Last 24 hours) at 02/15/2024 1431 Last data filed at 02/15/2024 0412 Gross per 24 hour  Intake 12099.08 ml  Output 900 ml  Net 11199.08 ml   Net IO Since Admission: 11,199.08 mL [02/15/24 1431]  Recent Labs    02/15/24 0410 02/15/24 0607 02/15/24 1228  GLUCAP 111* 111* 206*     Pertinent Labs:    Latest Ref Rng & Units 02/15/2024    5:52 AM 02/14/2024   11:21 AM 09/24/2023   11:26 AM  CBC  WBC 4.0 - 10.5 K/uL 7.5  6.8  6.7   Hemoglobin 12.0 - 15.0 g/dL 16.1  09.6  04.5   Hematocrit 36.0 - 46.0 % 36.9  39.5  39.5   Platelets 150 - 400 K/uL 327  306  355        Latest Ref Rng & Units 02/15/2024    5:52 AM 02/14/2024   11:36 PM 02/14/2024    8:16 PM  CMP  Glucose 70 - 99 mg/dL 84  409  811   BUN 6 - 20 mg/dL 32  38  41   Creatinine  0.44 - 1.00 mg/dL 9.14  7.82  9.56   Sodium 135 - 145 mmol/L 141  137  134   Potassium 3.5 - 5.1 mmol/L 4.2  3.3  3.4   Chloride 98 - 111 mmol/L 106  103  102   CO2 22 - 32 mmol/L 25  22  21    Calcium  8.9 - 10.3 mg/dL 9.7  9.5  9.5     Imaging: CT HEAD WO CONTRAST ( ) Result Date: 02/14/2024 CLINICAL DATA:  Weakness x4 days. EXAM: CT HEAD WITHOUT CONTRAST TECHNIQUE: Contiguous axial images were obtained from the base of the skull through the vertex without intravenous contrast. RADIATION DOSE REDUCTION: This exam was performed according to the departmental dose-optimization program which includes automated exposure control, adjustment of the mA and/or kV according to patient size and/or use of iterative reconstruction technique. COMPARISON:  None Available. FINDINGS: Brain: There is generalized cerebral atrophy with widening of the extra-axial spaces and ventricular dilatation. There are areas of decreased attenuation within the white matter tracts of the supratentorial brain, consistent with microvascular disease changes. A chronic right basal ganglia infarct is seen. A 2.7 cm x  2.97 suprasellar mass is noted consistent with the patient's known history of meningioma (axial CT image 11, CT series 2). This measured 2.8 cm x 2.1 cm on the prior study Vascular: No hyperdense vessel or unexpected calcification. Skull: Normal. Negative for fracture or focal lesion. Sinuses/Orbits: No acute finding. Other: None. IMPRESSION: 1. Generalized cerebral atrophy and microvascular disease changes of the supratentorial brain. 2. Chronic right basal ganglia infarct. 3. 2.7 cm x 2.97 suprasellar mass consistent with the patient's known history of meningioma. Electronically Signed   By: Virgle Grime M.D.   On: 02/14/2024 19:16   CT RENAL STONE STUDY Result Date: 02/14/2024 CLINICAL DATA:  Diffuse abdominal and flank pain EXAM: CT ABDOMEN AND PELVIS WITHOUT CONTRAST TECHNIQUE: Multidetector CT imaging of the  abdomen and pelvis was performed following the standard protocol without IV contrast. RADIATION DOSE REDUCTION: This exam was performed according to the departmental dose-optimization program which includes automated exposure control, adjustment of the mA and/or kV according to patient size and/or use of iterative reconstruction technique. COMPARISON:  11/30/2019 FINDINGS: Lower chest: Minimal bilateral lower lobe scarring unchanged. No acute pleural or parenchymal lung disease. Hepatobiliary: Cholecystectomy. Unremarkable unenhanced appearance of the liver. Pancreas: Unremarkable unenhanced appearance. Spleen: Unremarkable unenhanced appearance. Adrenals/Urinary Tract: No urinary tract calculi or obstructive uropathy within either kidney. The adrenals are unremarkable. Bladder is moderately distended without focal abnormality. Stomach/Bowel: No bowel obstruction or ileus. Normal appendix right lower quadrant. No bowel wall thickening or inflammatory change. Vascular/Lymphatic: Aortic atherosclerosis. No enlarged abdominal or pelvic lymph nodes. Reproductive: Status post hysterectomy. No adnexal masses. Other: No free fluid or free intraperitoneal gas. No abdominal wall hernia. Musculoskeletal: No acute or destructive bony abnormalities. Reconstructed images demonstrate no additional findings. IMPRESSION: 1. Unremarkable unenhanced CT of the abdomen and pelvis. No urinary tract calculi or obstructive uropathy. 2.  Aortic Atherosclerosis (ICD10-I70.0). Electronically Signed   By: Bobbye Burrow M.D.   On: 02/14/2024 19:14    Assessment/Plan:   Principal Problem:   Hyperglycemia  # Hyperglycemia # Metabolic acidosis # Pseudohyponatremia She is doing well.  Beta-hydroxybutyrate, urinary ketones, all normal.  Bicarb normal anion gap was closed.  Normal lactate..  Hyperglycemia has resolved with IV fluids and SSI.  I think patient was in early stages of DKA, that resolved quickly with IV fluids and insulin .   UA with pyuria and hematuria, treating with 3-day course of IV ceftriaxone .  Renal CT was negative for calculi.  Abdominal exam is much improved today.  - Regular diet, encourage p.o. intake. - SSI - 3-day course of IV ceftriaxone , #2/3  # AKI Likely prerenal, in the setting of osmotic diuresis.  SCr has improved 3.3 >>1.9 >>1.6.  Alternatively, post renal etiologies like neurogenic bladder in the setting of poorly controlled diabetes, is also on the differential.  CT renal stone was negative for calculi or any obstructive uropathy. - Encourage p.o. intake - Avoid nephrotoxic drugs. - BMP.   # Uncontrolled type 2 diabetes Blood glucose have improved, now normal.  A1c 6 months ago was 9.5, repeat pending. At home, she is on Lantus  40 units twice daily, and Humalog  12 units 3 times daily, and Ozempic  0.25 mg/week.  She intermittently forgets to take medicines as prescribed. - CBGs - SSI.   # Hemorrhagic infarct of the right basal ganglia # Staggering gait # Cognitive impairment Repeat CT scan performed yesterday demonstrated generalized cerebral atrophy, a chronic right basal ganglia infarct, and a suprasellar mass consistent with the patient's known meningioma. These findings  are stable and unchanged compared to the MRI dated 12/24. Laboratory studies show folate and vitamin B12 levels within normal limits. The patient would benefit from acute rehabilitation to improve strength and receive additional support with medication management. Physical therapy and occupational therapy evaluations will be arranged.  - Aspirin  81 mg daily. - PT eval and treat - OT eval and treat.  # Essential Hypertension. Normotensive.  Holding home BP meds.   #Pulmonary hypertension CT 06/24 showed enlarged pulmonary trunk indicative of pulm arterial hypertension.  I suspect this is from the underlying lung disease.  Could consider outpatient pulmonology follow-up.  Chronic medical conditions Depression:  Sertraline  50 mg.  Holding Chronic bronchitis: On Symbicort , albuterol , and Advair .  Holding. Insomnia: Trazodone  300 mg.  Holding.  Diet: Regular diet IVF: PO intake VTE: enoxaparin  (LOVENOX ) injection 40 mg Start: 02/15/24 1000 SCDs Start: 02/15/24 9371 Code: Full PT/OT: Pending ID:  Anti-infectives (From admission, onward)    Start     Dose/Rate Route Frequency Ordered Stop   02/15/24 1515  cefTRIAXone  (ROCEPHIN ) 2 g in sodium chloride  0.9 % 100 mL IVPB        2 g 200 mL/hr over 30 Minutes Intravenous Every 24 hours 02/15/24 1416 02/17/24 1514   02/14/24 1815  cefTRIAXone  (ROCEPHIN ) 2 g in sodium chloride  0.9 % 100 mL IVPB        2 g 200 mL/hr over 30 Minutes Intravenous  Once 02/14/24 1807 02/14/24 1931      Marni Sins, M.D.  Internal Medicine Resident, PGY-1 [facility]   2:31 PM, 02/15/2024

## 2024-02-15 NOTE — Evaluation (Signed)
 Physical Therapy Evaluation Patient Details Name: Barbara Wang MRN: [id number] DOB: Jul 16, 1966 Today's Date: 02/15/2024  History of Present Illness  Barbara Wang is a 58 year old female who presents with 4 days of progressively worsening generalized weakness. Past medical history significant for uncontrolled insulin -dependent T2DM, hypertension.  Clinical Impression  Pt presents with admitting diagnosis above. Pt today was able to ambulate in hallway with RW CGA. PTA pt reports that he needed lots of assistance from family for mobility and ADLs. Pt also endorses several falls over the last 6 months and lives on the 2nd floor of an apartment with her daughter. Patient will benefit from continued inpatient follow up therapy, <3 hours/day. PT will continue to follow.         If plan is discharge home, recommend the following: A little help with walking and/or transfers;A little help with bathing/dressing/bathroom;Direct supervision/assist for financial management;Direct supervision/assist for medications management;Assist for transportation;Help with stairs or ramp for entrance   Can travel by private vehicle   Yes    Equipment Recommendations None recommended by PT  Recommendations for Other Services       Functional Status Assessment Patient has had a recent decline in their functional status and demonstrates the ability to make significant improvements in function in a reasonable and predictable amount of time.     Precautions / Restrictions Precautions Precautions: Fall Recall of Precautions/Restrictions: Intact Restrictions Weight Bearing Restrictions Per Provider Order: No      Mobility  Bed Mobility Overal bed mobility: Needs Assistance Bed Mobility: Supine to Sit     Supine to sit: Supervision          Transfers Overall transfer level: Needs assistance Equipment used: Rolling walker (2 wheels) Transfers: Sit to/from Stand Sit to Stand: Supervision            General transfer comment: Cues for hand placement    Ambulation/Gait Ambulation/Gait assistance: Contact guard assist Gait Distance (Feet): 125 Feet Assistive device: Rolling walker (2 wheels) Gait Pattern/deviations: Trunk flexed, Decreased stride length, Step-through pattern Gait velocity: decreased Gait velocity interpretation: <1.31 ft/sec, indicative of household ambulator   General Gait Details: Very slowed step through pattern. Pt states that if she walked any faster then she would fall.  Stairs Stairs: Yes Stairs assistance: Contact guard assist Stair Management: Step to pattern, Two rails, Forwards Number of Stairs: 2 General stair comments: no LOB noted.  Wheelchair Mobility     Tilt Bed    Modified Rankin (Stroke Patients Only)       Balance Overall balance assessment: Mild deficits observed, not formally tested                                           Pertinent Vitals/Pain Pain Assessment Pain Assessment: 0-10 Pain Score: 7  Pain Location: stomach and back Pain Descriptors / Indicators: Sore Pain Intervention(s): Monitored during session, Limited activity within patient's tolerance    Home Living Family/patient expects to be discharged to:: Private residence Living Arrangements: Children Available Help at Discharge: Family;Available PRN/intermittently Type of Home: Apartment Home Access: Stairs to enter Entrance Stairs-Rails: Can reach both Entrance Stairs-Number of Steps: 15   Home Layout: One level Home Equipment: Agricultural consultant (2 wheels);Cane - single point;Hand held shower head Additional Comments: Same as previous admission    Prior Function Prior Level of Function : Needs assist;History of Falls (last six months) (Pt  reports at least 5 falls over the last few months)             Mobility Comments: Pt reports feeling weak over the last few weeks and needing more assistance freom family ADLs Comments: Pt  reports needing more assistance lately     Extremity/Trunk Assessment   Upper Extremity Assessment Upper Extremity Assessment: Overall WFL for tasks assessed    Lower Extremity Assessment Lower Extremity Assessment: Overall WFL for tasks assessed    Cervical / Trunk Assessment Cervical / Trunk Assessment: Normal  Communication   Communication Communication: No apparent difficulties    Cognition Arousal: Alert Behavior During Therapy: WFL for tasks assessed/performed   PT - Cognitive impairments: No apparent impairments                         Following commands: Intact       Cueing Cueing Techniques: Verbal cues, Tactile cues     General Comments General comments (skin integrity, edema, etc.): VSS    Exercises     Assessment/Plan    PT Assessment Patient needs continued PT services  PT Problem List Decreased strength;Decreased range of motion;Decreased activity tolerance;Decreased balance;Decreased mobility;Decreased coordination;Decreased knowledge of use of DME;Decreased safety awareness;Decreased knowledge of precautions;Cardiopulmonary status limiting activity       PT Treatment Interventions DME instruction;Gait training;Stair training;Therapeutic activities;Functional mobility training;Therapeutic exercise;Balance training;Neuromuscular re-education;Cognitive remediation;Patient/family education    PT Goals (Current goals can be found in the Care Plan section)  Acute Rehab PT Goals Patient Stated Goal: to go home PT Goal Formulation: With patient Time For Goal Achievement: 02/29/24 Potential to Achieve Goals: Good    Frequency Min 3X/week     Co-evaluation               AM-PAC PT 6 Clicks Mobility  Outcome Measure Help needed turning from your back to your side while in a flat bed without using bedrails?: A Little Help needed moving from lying on your back to sitting on the side of a flat bed without using bedrails?: A  Little Help needed moving to and from a bed to a chair (including a wheelchair)?: A Little Help needed standing up from a chair using your arms (e.g., wheelchair or bedside chair)?: A Little Help needed to walk in hospital room?: A Little Help needed climbing 3-5 steps with a railing? : A Little 6 Click Score: 18    End of Session Equipment Utilized During Treatment: Gait belt Activity Tolerance: Patient tolerated treatment well Patient left: Other (comment) (Handoff to OT in gym) Nurse Communication: Mobility status PT Visit Diagnosis: Other abnormalities of gait and mobility (R26.89)    Time: 6213-0865 PT Time Calculation (min) (ACUTE ONLY): 34 min   Charges:   PT Evaluation $PT Eval Moderate Complexity: 1 Mod PT Treatments $Gait Training: 8-22 mins PT General Charges $$ ACUTE PT VISIT: 1 Visit         Rodgers Clack, PT, DPT Acute Rehab Services [phone]   Lanea Vankirk 02/15/2024, 12:14 PM

## 2024-02-15 NOTE — Inpatient Diabetes Management (Signed)
 Inpatient Diabetes Program Recommendations  AACE/ADA: New Consensus Statement on Inpatient Glycemic Control (2015)  Target Ranges:  Prepandial:   less than 140 mg/dL      Peak postprandial:   less than 180 mg/dL (1-2 hours)      Critically ill patients:  140 - 180 mg/dL   Lab Results  Component Value Date   GLUCAP 206 (H) 02/15/2024   HGBA1C 9.5 (A) 07/31/2023    Review of Glycemic Control  Latest Reference Range & Units 02/14/24 14:59 02/14/24 17:15 02/14/24 20:01 02/14/24 21:06 02/14/24 23:55 02/15/24 04:10 02/15/24 06:07 02/15/24 12:28  Glucose-Capillary 70 - 99 mg/dL 213 (H) 086 (H) 578 (H) 262 (H) 186 (H) 111 (H) 111 (H) 206 (H)  (H): Data is abnormally high  Diabetes history: DM2 Outpatient Diabetes medications: Basaglar  40 units BID, Humalog  12 units TID, Ozempic  1 mg weekly Current orders for Inpatient glycemic control: Semglee  40 units every day, Novolog  0-9 units TID and 0-5 units QHS  Met with patient at bedside.  She is current with Dr. Zleming with the [facility].  She confirms above home DM medications.  She comes to the ED with hyperglycemia.  She states her BG has been running in the 300's at home.  She admits to forgetting to take her insulins off and on.  She uses a glucometer to check her glucose.  Encouraged her to set alarms on her Android to alert her to take her insulins.  Asked if she would be interested in a CGM and she states she would be interested.  Asked [facility] pharmacy for a benefit check.  Her Android is at home so I am unsure if it is compatible.  Will ask for a reader as well.  Explained how the CGM works.  If she discharges over the weekend she and her daughter can apply the sensor at home.  The instructions are with each sensor and are very simple.  They can also YouTube how to place and how to start the reader.    Educated on The Plate Method, CHO's, portion control, avoiding caloric beverages, CBGs at home fasting and mid afternoon, F/U with PCP every 3 months,  bring meter to PCP office, long and short term complications of uncontrolled BG, and importance of exercise.  Reviewed hypoglycemia, < 70 mg/dL, signs, symptoms and treatments.   Discharge Recommendations: Other recommendations: Please order the [facility] 3 Order # [id number] & Reader Order # [id number]   Use Adult Diabetes Insulin  Treatment Post Discharge order set.  Thank you, Hays Lipschutz, MSN, CDCES Diabetes Coordinator Inpatient Diabetes Program [phone] (team pager from 8a-5p)

## 2024-02-15 NOTE — Telephone Encounter (Signed)
 Pharmacy Patient Advocate Encounter  Received notification from [facility] that Prior Authorization for FreeStyle Libre 3 Plus Sensor has been APPROVED from 02/15/2024 to 08/13/2024. Ran test claim, Copay is $0.00. This test claim was processed through [facility]- copay amounts may vary at other pharmacies due to pharmacy/plan contracts, or as the patient moves through the different stages of their insurance plan.   PA #/Case ID/Reference #: [id number] Key: [id number]

## 2024-02-15 NOTE — Evaluation (Signed)
 Occupational Therapy Evaluation Patient Details Name: Barbara Wang MRN: [id number] DOB: 12/28/1965 Today's Date: 02/15/2024   History of Present Illness   Ms. Presti is a 58 year old female who presents with 4 days of progressively worsening generalized weakness. Past medical history significant for uncontrolled insulin -dependent T2DM, hypertension.     Clinical Impressions Pt admitted for above, PTA pt reports living with daughter and needing more assist with iADLs and occasional assist with ADLs recently. Pt does not have direct supervision/assist from family for mobility and ADLs as family members work. Pt currently ambulating hallway level with CGA + RW, needs min A to setup A for ADLs and demonstrated impaired balance from her baseline. OT to continue working with pt to challenge balance during functional tasks and progress pt closer to baseline. Patient with hx of several falls recently, will benefit from continued inpatient follow up therapy, <3 hours/day       If plan is discharge home, recommend the following:   Assistance with cooking/housework;A little help with bathing/dressing/bathroom     Functional Status Assessment   Patient has had a recent decline in their functional status and demonstrates the ability to make significant improvements in function in a reasonable and predictable amount of time.     Equipment Recommendations   None recommended by OT     Recommendations for Other Services         Precautions/Restrictions   Precautions Precautions: Fall Recall of Precautions/Restrictions: Intact Restrictions Weight Bearing Restrictions Per Provider Order: No     Mobility Bed Mobility               General bed mobility comments: Pt OOB on OT arrival working with PT.    Transfers                          Balance Overall balance assessment: Mild deficits observed, not formally tested                                          ADL either performed or assessed with clinical judgement   ADL Overall ADL's : Needs assistance/impaired Eating/Feeding: Independent;Sitting   Grooming: Standing;Contact guard assist   Upper Body Bathing: Standing;Contact guard assist   Lower Body Bathing: Minimal assistance;Sit to/from stand   Upper Body Dressing : Sitting;Set up   Lower Body Dressing: Sit to/from stand;Contact guard assist Lower Body Dressing Details (indicate cue type and reason): doff/don briefs Toilet Transfer: Rolling walker (2 wheels);Contact guard assist   Toileting- Clothing Manipulation and Hygiene: Contact guard assist;Sit to/from stand   Tub/ Shower Transfer: Contact guard assist;Tub transfer;Rolling walker (2 wheels) Tub/Shower Transfer Details (indicate cue type and reason): Demonstrated tub transfer with tub bench vs BSC vs shower seat with back. Had pt trial transfer in/out shower using wall support to walk her body over. Functional mobility during ADLs: Contact guard assist;Rolling walker (2 wheels)       Vision   Vision Assessment?: Vision impaired- to be further tested in functional context Additional Comments: Pt noted to bump into objects on the L when using RW, dreports vision is okay but not as good     Perception Perception: Not tested (to be further assessed)       Praxis Praxis: [facility]       Pertinent Vitals/Pain Pain Assessment Pain Assessment: 0-10 Pain Score: 7  Pain Location:  stomach and back Pain Descriptors / Indicators: Sore Pain Intervention(s): Monitored during session, Limited activity within patient's tolerance     Extremity/Trunk Assessment Upper Extremity Assessment Upper Extremity Assessment: Overall WFL for tasks assessed   Lower Extremity Assessment Lower Extremity Assessment: Overall WFL for tasks assessed   Cervical / Trunk Assessment Cervical / Trunk Assessment: Normal   Communication Communication Communication: No apparent  difficulties   Cognition Arousal: Alert Behavior During Therapy: WFL for tasks assessed/performed Cognition: No apparent impairments                               Following commands: Intact       Cueing  General Comments   Cueing Techniques: Verbal cues;Tactile cues  VSS   Exercises     Shoulder Instructions      Home Living Family/patient expects to be discharged to:: Private residence Living Arrangements: Children Available Help at Discharge: Family;Available PRN/intermittently Type of Home: Apartment Home Access: Stairs to enter Entrance Stairs-Number of Steps: 15 Entrance Stairs-Rails: Can reach both Home Layout: One level     Bathroom Shower/Tub: Chief Strategy Officer: Standard Bathroom Accessibility: Yes   Home Equipment: Agricultural consultant (2 wheels);Cane - single point;Hand held shower head   Additional Comments: Same as previous admission      Prior Functioning/Environment Prior Level of Function : Needs assist;History of Falls (last six months) (Pt reports at least 5 falls over the last few months)             Mobility Comments: Pt reports feeling weak over the last few weeks and needing more assistance from family ADLs Comments: Pt reports needing more assistance lately, family assist with iADLs. can perform ADLs without physical assist.    OT Problem List: Impaired balance (sitting and/or standing)   OT Treatment/Interventions: Self-care/ADL training;Patient/family education;Therapeutic exercise;Balance training;Therapeutic activities;DME and/or AE instruction      OT Goals(Current goals can be found in the care plan section)   Acute Rehab OT Goals Patient Stated Goal: To get strong enough to go home OT Goal Formulation: With patient Time For Goal Achievement: 02/29/24 Potential to Achieve Goals: Good ADL Goals Pt Will Perform Grooming: standing;with set-up Pt Will Perform Lower Body Dressing: sit to/from  stand;with set-up Pt Will Transfer to Toilet: ambulating;with supervision Pt Will Perform Tub/Shower Transfer: Tub transfer;with modified independence (+LRAD)   OT Frequency:  Min 2X/week    Co-evaluation              AM-PAC OT 6 Clicks Daily Activity     Outcome Measure Help from another person eating meals?: None Help from another person taking care of personal grooming?: A Little Help from another person toileting, which includes using toliet, bedpan, or urinal?: A Little Help from another person bathing (including washing, rinsing, drying)?: A Little Help from another person to put on and taking off regular upper body clothing?: A Little Help from another person to put on and taking off regular lower body clothing?: A Little 6 Click Score: 19   End of Session Equipment Utilized During Treatment: Gait belt;Rolling walker (2 wheels) Nurse Communication: Mobility status  Activity Tolerance: Patient tolerated treatment well Patient left: with call bell/phone within reach;in bed;with bed alarm set  OT Visit Diagnosis: History of falling (Z91.81);Unsteadiness on feet (R26.81)                Time: 0454-0981 OT Time Calculation (min): 22 min Charges:  OT General Charges $OT Visit: 1 Visit OT Evaluation $OT Eval Low Complexity: 1 Low  02/15/2024  AB, OTR/L  Acute Rehabilitation Services  Office: [phone]   Jorene New 02/15/2024, 1:40 PM

## 2024-02-15 NOTE — TOC Progression Note (Signed)
 Transition of Care [facility]) - Progression Note    Patient Details  Name: Barbara Wang MRN: [id number] Date of Birth: [birth year]  Transition of Care [facility]) CM/SW Contact  Tandy Fam, [place] Phone Number: 02/15/2024, 3:52 PM  Clinical Narrative:   CSW met with patient to discuss recommendation for SNF. Patient in agreement, preference to be close to home if possible. CSW explained barrier to SNF with Medicaid and it'll depend on bed availability, and patient indicated understanding. CSW completed referral, will update patient with any bed offers.    Expected Discharge Plan: Skilled Nursing Facility Barriers to Discharge: Continued Medical Work up, English as a second language teacher  Expected Discharge Plan and Services   Discharge Planning Services: CM Consult Post Acute Care Choice: Skilled Nursing Facility Living arrangements for the past 2 months: Apartment                                       Social Determinants of Health (SDOH) Interventions SDOH Screenings   Food Insecurity: Food Insecurity Present (10/09/2022)  Housing: Low Risk  (08/23/2023)  Transportation Needs: No Transportation Needs (08/24/2023)  Utilities: Not At Risk (08/23/2023)  Depression (PHQ2-9): High Risk (09/24/2023)  Financial Resource Strain: Low Risk  (07/31/2023)  Physical Activity: Inactive (07/31/2023)  Social Connections: Unknown (01/06/2022)   Received from [facility]  Stress: No Stress Concern Present (07/31/2023)  Tobacco Use: High Risk (02/14/2024)  Health Literacy: Adequate Health Literacy (07/31/2023)    Readmission Risk Interventions     No data to display

## 2024-02-15 NOTE — TOC Initial Note (Signed)
 Transition of Care [facility]) - Initial/Assessment Note    Patient Details  Name: Barbara Wang MRN: [id number] Date of Birth: Apr 21, 1966  [facility]) CM/SW Contact:    Jonathan Neighbor, RN Phone Number: 02/15/2024, 11:47 AM  Clinical Narrative:                  Pt is from home with her daughter. Daughter works during the daytime and pt is alone. She doesn't have anyone else to check on her.  She manages her own medications and does say she forgets to take her medications at times. CM discussed setting alarms on her phone to remind her but she says she doesn't keep up with her phone. CM offered information on medication dispensing machine that would alarm when time to take meds and she says she can not afford this.   She does admit to issues with transportation. CM has added the number for [facility] transportation services to her AVS. She will call 2-3 days in advance to schedule rides for appointments.   Awaiting therapy evals.  TOC following.  Expected Discharge Plan:  (TBD) Barriers to Discharge: Continued Medical Work up   Patient Goals and CMS Choice            Expected Discharge Plan and Services   Discharge Planning Services: CM Consult   Living arrangements for the past 2 months: Apartment                                      Prior Living Arrangements/Services Living arrangements for the past 2 months: Apartment Lives with:: Adult Children Patient language and need for interpreter reviewed:: Yes Do you feel safe going back to the place where you live?: Yes        Care giver support system in place?: No (comment) Current home services: DME (cane) Criminal Activity/Legal Involvement Pertinent to Current Situation/Hospitalization: No - Comment as needed  Activities of Daily Living      Permission Sought/Granted                  Emotional Assessment Appearance:: Appears stated age Attitude/Demeanor/Rapport:  Engaged Affect (typically observed): Accepting Orientation: : Oriented to Self, Oriented to Place, Oriented to  Time, Oriented to Situation   Psych Involvement: No (comment)  Admission diagnosis:  Dehydration [E86.0] Hyperglycemia [R73.9] AKI (acute kidney injury) (HCC) [N17.9] Patient Active Problem List   Diagnosis Date Noted   Hyperglycemia 02/14/2024   DKA (diabetic ketoacidosis) (HCC) 08/23/2023   Acute metabolic encephalopathy 08/23/2023   AKI (acute kidney injury) (HCC) 05/14/2022   Moderate episode of recurrent major depressive disorder (HCC) 01/26/2022   Vitamin D  deficiency 01/26/2022   Antibiotic-induced yeast infection 01/26/2022   DKA, type 2, not at goal [facility]) 09/30/2021   Diabetic ketoacidosis without coma associated with diabetes mellitus due to underlying condition (HCC) 09/18/2021   Pain in joint of right knee 09/14/2021   Osteoarthritis of knee 08/16/2021   Poorly controlled type 2 diabetes mellitus with complication (HCC) 04/27/2021   Essential hypertension 04/27/2021   Wrist pain, acute, right 07/22/2019   Mixed hyperlipidemia 07/22/2019   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, [address] [place] [address] Phone: [phone] Fax: [phone]  [facility] [address] [place] - [address] [facility] [place] [address] Phone: [phone] Fax: [phone]  [id number] Georgeana Kindler, [place] - [address] AT [facility] [address] [place] [address] Phone: [phone] Fax: [phone]  [facility] [address] [place] [address] Phone: [phone]  Fax: [phone]     Social Drivers of Health (SDOH) Social History: SDOH Screenings   Food Insecurity: Food Insecurity Present (10/09/2022)  Housing: Low Risk  (08/23/2023)  Transportation Needs: No Transportation Needs (08/24/2023)  Utilities: Not At Risk (08/23/2023)  Depression (PHQ2-9): High Risk (09/24/2023)  Financial Resource Strain: Low Risk  (07/31/2023)  Physical Activity: Inactive (07/31/2023)  Social Connections: Unknown (01/06/2022)   Received from [facility]  Stress: No Stress Concern Present (07/31/2023)  Tobacco Use: High Risk (02/14/2024)  Health Literacy: Adequate Health Literacy (07/31/2023)   SDOH Interventions:     Readmission Risk Interventions     No data to display

## 2024-02-15 NOTE — NC FL2 (Signed)
 [place]  MEDICAID FL2 LEVEL OF CARE FORM     IDENTIFICATION  Patient Name: Barbara Wang Birthdate: [birth year] Sex: female Admission Date (Current Location): 02/14/2024  [facility] and [place] Number:  [employer] and Address:  [facility] [place], [address] [place], [place] [address]      Provider Number: [id number]  Attending Physician Name and Address:  Sherol Dixie, MD  Relative Name and Phone Number:       Current Level of Care: Hospital Recommended Level of Care: Skilled Nursing Facility Prior Approval Number:    Date Approved/Denied:   PASRR Number:  [id number]  Discharge Plan: SNF    Current Diagnoses: Patient Active Problem List   Diagnosis Date Noted   Hyperglycemia 02/14/2024   DKA (diabetic ketoacidosis) (HCC) 08/23/2023   Acute metabolic encephalopathy 08/23/2023   AKI (acute kidney injury) (HCC) 05/14/2022   Moderate episode of recurrent major depressive disorder (HCC) 01/26/2022   Vitamin D  deficiency 01/26/2022   Antibiotic-induced yeast infection 01/26/2022   DKA, type 2, not at goal [facility]) 09/30/2021   Diabetic ketoacidosis without coma associated with diabetes mellitus due to underlying condition (HCC) 09/18/2021   Pain in joint of right knee 09/14/2021   Osteoarthritis of knee 08/16/2021   Poorly controlled type 2 diabetes mellitus with complication (HCC) 04/27/2021   Essential hypertension 04/27/2021   Wrist pain, acute, right 07/22/2019   Mixed hyperlipidemia 07/22/2019   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   UTI (urinary tract infection) 12/29/2018   Acute pyelonephritis 12/28/2018   Asthma 03/16/2017   Hyperosmolar hyperglycemic state (HHS) (HCC) 07/02/2016   Obesity     Orientation RESPIRATION BLADDER Height & Weight     Self, Time, Situation, Place  Normal Incontinent Weight: 169 lb (76.7 kg) Height:  5' 4 (162.6 cm)  BEHAVIORAL SYMPTOMS/MOOD NEUROLOGICAL BOWEL NUTRITION STATUS       Continent Diet (See DC Summary)  AMBULATORY STATUS COMMUNICATION OF NEEDS Skin   Limited Assist Verbally Normal                       Personal Care Assistance Level of Assistance  Bathing, Dressing, Feeding Bathing Assistance: Limited assistance Feeding assistance: Limited assistance Dressing Assistance: Limited assistance     Functional Limitations Info  Speech, Sight, Hearing Sight Info: Adequate Hearing Info: Adequate Speech Info: Adequate    SPECIAL CARE FACTORS FREQUENCY  PT (By licensed PT), OT (By licensed OT)     PT Frequency: 5x/week OT Frequency: 5x/week            Contractures Contractures Info: Not present    Additional Factors Info  Code Status, Allergies Code Status Info: Full Allergies Info: Azithromycin; Ertythromycin Base; Sulfa Antibiotics; Tramadol            Current Medications (02/15/2024):  This is the current hospital active medication list Current Facility-Administered Medications  Medication Dose Route Frequency Provider Last Rate Last Admin   acetaminophen  (TYLENOL ) tablet 650 mg  650 mg Oral Q6H PRN Marni Sins, MD       Or   acetaminophen  (TYLENOL ) suppository 650 mg  650 mg Rectal Q6H PRN Marni Sins, MD       alum & mag hydroxide-simeth (MAALOX/MYLANTA) 200-200-20 MG/5ML suspension 15 mL  15 mL Oral Q6H PRN Jearldine Mina, DO   15 mL at 02/15/24 0600   cefTRIAXone  (ROCEPHIN ) 2 g in sodium chloride  0.9 % 100 mL IVPB  2 g Intravenous Q24H Marni Sins, MD  enoxaparin  (LOVENOX ) injection 40 mg  40 mg Subcutaneous Q24H Gomez-Caraballo, Maria, MD   40 mg at 02/15/24 1246   insulin  aspart (novoLOG ) injection 0-5 Units  0-5 Units Subcutaneous QHS Gomez-Caraballo, Maria, MD       insulin  aspart (novoLOG ) injection 0-9 Units  0-9 Units Subcutaneous TID WC Gomez-Caraballo, Maria, MD   3 Units at 02/15/24 1244   insulin  glargine-yfgn (SEMGLEE ) injection 40 Units  40 Units Subcutaneous QHS Zheng, Michael, DO   40 Units at  02/14/24 2209   living well with diabetes book MISC   Does not apply Once Sherol Dixie, MD       polyethylene glycol (MIRALAX  / GLYCOLAX ) packet 17 g  17 g Oral Daily PRN Koomson, Julius, MD         Discharge Medications: Please see discharge summary for a list of discharge medications.  Relevant Imaging Results:  Relevant Lab Results:   Additional Information SSN: [id number]  Charise Companion, LCSW

## 2024-02-16 DIAGNOSIS — D329 Benign neoplasm of meninges, unspecified: Secondary | ICD-10-CM | POA: Diagnosis present

## 2024-02-16 DIAGNOSIS — E0590 Thyrotoxicosis, unspecified without thyrotoxic crisis or storm: Secondary | ICD-10-CM | POA: Insufficient documentation

## 2024-02-16 DIAGNOSIS — G319 Degenerative disease of nervous system, unspecified: Secondary | ICD-10-CM

## 2024-02-16 DIAGNOSIS — I6381 Other cerebral infarction due to occlusion or stenosis of small artery: Secondary | ICD-10-CM | POA: Diagnosis present

## 2024-02-16 DIAGNOSIS — R159 Full incontinence of feces: Secondary | ICD-10-CM

## 2024-02-16 DIAGNOSIS — Z7409 Other reduced mobility: Secondary | ICD-10-CM | POA: Diagnosis present

## 2024-02-16 DIAGNOSIS — R32 Unspecified urinary incontinence: Secondary | ICD-10-CM | POA: Insufficient documentation

## 2024-02-16 LAB — GLUCOSE, CAPILLARY
Glucose-Capillary: 358 mg/dL — ABNORMAL HIGH (ref 70–99)
Glucose-Capillary: 254 mg/dL — ABNORMAL HIGH (ref 70–99)
Glucose-Capillary: 149 mg/dL — ABNORMAL HIGH (ref 70–99)
Glucose-Capillary: 199 mg/dL — ABNORMAL HIGH (ref 70–99)
Glucose-Capillary: 206 mg/dL — ABNORMAL HIGH (ref 70–99)

## 2024-02-16 LAB — BASIC METABOLIC PANEL WITH GFR
Anion gap: 8 (ref 5–15)
BUN: 24 mg/dL — ABNORMAL HIGH (ref 6–20)
CO2: 23 mmol/L (ref 22–32)
Calcium: 9.2 mg/dL (ref 8.9–10.3)
Chloride: 105 mmol/L (ref 98–111)
Creatinine, Ser: 1.33 mg/dL — ABNORMAL HIGH (ref 0.44–1.00)
GFR, Estimated: 46 mL/min — ABNORMAL LOW (ref >60)
Glucose, Bld: 343 mg/dL — ABNORMAL HIGH (ref 70–99)
Potassium: 4.1 mmol/L (ref 3.5–5.1)
Sodium: 136 mmol/L (ref 135–145)

## 2024-02-16 MED ORDER — SERTRALINE HCL 100 MG PO TABS
100.0000 mg | ORAL_TABLET | Freq: Every day | ORAL | Status: DC
  Administered 2024-02-16 – 2024-02-19 (×4): 100 mg via ORAL
  Filled 2024-02-16 (×2): qty 1
  Filled 2024-02-16: qty 4
  Filled 2024-02-16: qty 1

## 2024-02-16 MED ORDER — INSULIN ASPART 100 UNIT/ML IJ SOLN
0.0000 [IU] | Freq: Three times a day (TID) | INTRAMUSCULAR | Status: DC
  Administered 2024-02-16 – 2024-02-17 (×2): 3 [IU] via SUBCUTANEOUS
  Administered 2024-02-17: 4 [IU] via SUBCUTANEOUS
  Administered 2024-02-18 (×2): 3 [IU] via SUBCUTANEOUS
  Administered 2024-02-18 – 2024-02-19 (×3): 4 [IU] via SUBCUTANEOUS

## 2024-02-16 MED ORDER — TRAZODONE HCL 50 MG PO TABS
300.0000 mg | ORAL_TABLET | Freq: Every day | ORAL | Status: DC
  Administered 2024-02-16 – 2024-02-18 (×3): 300 mg via ORAL
  Filled 2024-02-16 (×3): qty 6

## 2024-02-16 MED ORDER — LOPERAMIDE HCL 2 MG PO CAPS
2.0000 mg | ORAL_CAPSULE | Freq: Once | ORAL | Status: AC
  Administered 2024-02-16: 2 mg via ORAL
  Filled 2024-02-16: qty 1

## 2024-02-16 MED ORDER — ASPIRIN 81 MG PO TBEC
81.0000 mg | DELAYED_RELEASE_TABLET | Freq: Every day | ORAL | Status: DC
  Administered 2024-02-16 – 2024-02-19 (×4): 81 mg via ORAL
  Filled 2024-02-16 (×4): qty 1

## 2024-02-16 MED ORDER — INSULIN ASPART 100 UNIT/ML IJ SOLN
5.0000 [IU] | Freq: Once | INTRAMUSCULAR | Status: AC
  Administered 2024-02-16: 5 [IU] via SUBCUTANEOUS

## 2024-02-16 MED ORDER — LOPERAMIDE HCL 2 MG PO CAPS
2.0000 mg | ORAL_CAPSULE | ORAL | Status: DC | PRN
  Administered 2024-02-16 – 2024-02-19 (×4): 2 mg via ORAL
  Filled 2024-02-16 (×4): qty 1

## 2024-02-16 MED ORDER — CETAPHIL MOISTURIZING EX LOTN
TOPICAL_LOTION | Freq: Two times a day (BID) | CUTANEOUS | Status: DC
  Administered 2024-02-17: 1 via TOPICAL
  Filled 2024-02-16: qty 473

## 2024-02-16 MED ORDER — ORAL CARE MOUTH RINSE: 15.0000 mL | OROMUCOSAL | Status: DC | PRN

## 2024-02-16 MED ORDER — INSULIN ASPART 100 UNIT/ML IJ SOLN
2.0000 [IU] | Freq: Three times a day (TID) | INTRAMUSCULAR | Status: DC
  Administered 2024-02-16: 2 [IU] via SUBCUTANEOUS

## 2024-02-16 MED ORDER — ATORVASTATIN CALCIUM 40 MG PO TABS
40.0000 mg | ORAL_TABLET | Freq: Every day | ORAL | Status: DC
  Administered 2024-02-16 – 2024-02-18 (×3): 40 mg via ORAL
  Filled 2024-02-16 (×3): qty 1

## 2024-02-16 MED ORDER — INSULIN ASPART 100 UNIT/ML IJ SOLN
0.0000 [IU] | Freq: Every day | INTRAMUSCULAR | Status: DC
  Administered 2024-02-16: 2 [IU] via SUBCUTANEOUS

## 2024-02-16 MED ORDER — INSULIN ASPART 100 UNIT/ML IJ SOLN
0.0000 [IU] | Freq: Three times a day (TID) | INTRAMUSCULAR | Status: DC
  Administered 2024-02-16: 8 [IU] via SUBCUTANEOUS

## 2024-02-16 MED ORDER — MOMETASONE FURO-FORMOTEROL FUM 200-5 MCG/ACT IN AERO
2.0000 | INHALATION_SPRAY | Freq: Two times a day (BID) | RESPIRATORY_TRACT | Status: DC
  Administered 2024-02-16 – 2024-02-19 (×5): 2 via RESPIRATORY_TRACT
  Filled 2024-02-16: qty 8.8

## 2024-02-16 NOTE — Progress Notes (Signed)
 HD#1 SUBJECTIVE:  Patient Summary: Barbara Wang is a 58 y.o. 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 L lower back pain resolved. Was able to obtain adult briefs and feels more comfortable drinking fluids. Appetite has improved as well. Denies chest pain, dyspnea, nausea, vomiting. Continues to endorse fecal incontinence; tells me this has been going on for years, not associated with food. This also happens at night while she sleeps.  Spoke with daughter yesterday. Would like to proceed with SNF.  OBJECTIVE:  Vital Signs: Vitals:   02/15/24 2033 02/15/24 2356 02/16/24 0413 02/16/24 0826  BP: (!) 146/86 129/81 122/70 (!) 107/48  Pulse: (!) 106 92 85 84  Resp: 20 20 20 17   Temp: 98.4 F (36.9 C) 98.3 F (36.8 C) 98.3 F (36.8 C) 97.9 F (36.6 C)  TempSrc:      SpO2: 98% 100% 100% 100%  Weight:      Height:       Supplemental O2: Room Air SpO2: 100 %  Filed Weights   02/14/24 1151  Weight: 76.7 kg     Intake/Output Summary (Last 24 hours) at 02/16/2024 1333 Last data filed at 02/16/2024 0012 Gross per 24 hour  Intake 580 ml  Output 200 ml  Net 380 ml   Net IO Since Admission: 11,279.08 mL [02/16/24 1333]  Physical Exam: Woman appearing older than stated age laying in bed in NAD Moist mucus membranes RRR without murmurs Clear to auscultation bilaterally without wheezing or rales Soft, mildly protuberant abdomen, without tenderness. No CVA or suprapubic tenderness. Adult briefs on No lower extremity edema Dry appearance of lower extremity  skin with appropriate dorsal wrist and chest skin turgor Alert and oriented to self, date, place, and situation. Proper thought content Pleasant mood and affect   Patient Lines/Drains/Airways Status     Active Line/Drains/Airways     Name Placement date Placement time Site Days   Peripheral IV 02/14/24 20 G Distal;Posterior;Right Forearm 02/14/24  1135  Forearm  2            Pertinent labs and imaging:      Latest Ref Rng & Units 02/15/2024    5:52 AM 02/14/2024   11:21 AM 09/24/2023   11:26 AM  CBC  WBC 4.0 - 10.5 K/uL 7.5  6.8  6.7   Hemoglobin 12.0 - 15.0 g/dL 87.1  86.6  87.3   Hematocrit 36.0 - 46.0 % 36.9  39.5  39.5   Platelets 150 - 400 K/uL 327  306  355        Latest Ref Rng & Units 02/16/2024    5:52 AM 02/15/2024    5:52 AM 02/14/2024   11:36 PM  CMP  Glucose 70 - 99 mg/dL 656  84  801   BUN 6 - 20 mg/dL 24  32  38   Creatinine 0.44 - 1.00 mg/dL 8.66  8.30  8.07   Sodium 135 - 145 mmol/L 136  141  137   Potassium 3.5 - 5.1 mmol/L 4.1  4.2  3.3   Chloride 98 - 111 mmol/L 105  106  103   CO2 22 - 32 mmol/L 23  25  22   Calcium  8.9 - 10.3 mg/dL 9.2  9.7  9.5       ASSESSMENT/PLAN:  Assessment: Principal Problem:   Hyperglycemia Active Problems:   Essential hypertension   UTI (urinary tract infection)   Subclinical hyperthyroidism   Fecal incontinence due to diabetic neuropathy   Urinary incontinence due diabetic neuropathy   Impaired mobility   Cerebellar atrophy (HCC)   Hx of Lacunar infarction (HCC)   Suprasellar Meningioma (HCC)   Plan: Uncontrolled T2DM Medication non adherence due to cognitive decline Multiple admissions for DKA in the past. A1c pending. cBGs continue to be uncontrolled with 40 units of Semglee  with improvement in appetite.  - Continue Semglee  40 units QHS - Escalate SSI to resistant - Add 2 units Novolog  with meals - Nightly correction insulin  - Patient qualifies for Free style libre on outpatient setting  AKI Due  to osmotic diuresis secondary to presenting hyperglycemia and renally acting antihypertensives. Now improving. Increased PO intake - I/Os - Avoiding nephrotoxic medications - Monitoring PO intake - Monitor BMO daily until resolution, back to baseline  UTI On day 3 CTX. Recollected U/A and culture as suggested per lab.  - Follow up urine cultures - Today is the last day of Abx coverage  Hematuria Chronic. No stone on CT study. Treating for UTI above.  - Will need repeat urine studies at follow up - If persistent, will need urologic evaluation  Basal ganglia hemorrhagic infarct Lacunar ischemic infarcts Cerebellar and generalized cerebral atrophy Secondary to microvascular disease. Cerebella atrophy with gait instability. - Contine ASA 81 mg daily - Continue Crestor 40 mg daily  Subclinical hyperthyroidism Currently asymptomatic.  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 No association  of fecal incontinence with food. Patient with chronic nocturia - Adult briefs vs PurWik when briefs unavailable - PRN imodium   Best Practice: Diet: Diabetic diet VTE: enoxaparin  (LOVENOX ) injection 40 mg Start: 02/15/24 1000 SCDs Start: 02/15/24 9352 Code: Full  Disposition planning: Therapy Recs: SNF, DME: straight cane Family Contact: Daughters to be notified DISPO: Anticipated discharge in 2 days to Skilled nursing facility pending SNF selection, approval, and bed availability.  Signature:  Hadassah Elnora [facility]  1:33 PM, 02/16/2024  On  Call pager [phone]

## 2024-02-16 NOTE — Plan of Care (Signed)
  Problem: Coping: Goal: Ability to adjust to condition or change in health will improve Outcome: Progressing   Problem: Fluid Volume: Goal: Ability to maintain a balanced intake and output will improve Outcome: Progressing   Problem: Health Behavior/Discharge Planning: Goal: Ability to identify and utilize available resources and services will improve Outcome: Progressing Goal: Ability to manage health-related needs will improve Outcome: Progressing

## 2024-02-17 DIAGNOSIS — N390 Urinary tract infection, site not specified: Secondary | ICD-10-CM

## 2024-02-17 DIAGNOSIS — G4700 Insomnia, unspecified: Secondary | ICD-10-CM

## 2024-02-17 DIAGNOSIS — E119 Type 2 diabetes mellitus without complications: Secondary | ICD-10-CM

## 2024-02-17 DIAGNOSIS — Z8673 Personal history of transient ischemic attack (TIA), and cerebral infarction without residual deficits: Secondary | ICD-10-CM

## 2024-02-17 DIAGNOSIS — Z91148 Patient's other noncompliance with medication regimen for other reason: Secondary | ICD-10-CM

## 2024-02-17 DIAGNOSIS — F32A Depression, unspecified: Secondary | ICD-10-CM

## 2024-02-17 DIAGNOSIS — R159 Full incontinence of feces: Secondary | ICD-10-CM

## 2024-02-17 DIAGNOSIS — E785 Hyperlipidemia, unspecified: Secondary | ICD-10-CM

## 2024-02-17 DIAGNOSIS — E1139 Type 2 diabetes mellitus with other diabetic ophthalmic complication: Secondary | ICD-10-CM

## 2024-02-17 DIAGNOSIS — K529 Noninfective gastroenteritis and colitis, unspecified: Secondary | ICD-10-CM | POA: Insufficient documentation

## 2024-02-17 DIAGNOSIS — I10 Essential (primary) hypertension: Secondary | ICD-10-CM

## 2024-02-17 DIAGNOSIS — R32 Unspecified urinary incontinence: Secondary | ICD-10-CM

## 2024-02-17 DIAGNOSIS — N179 Acute kidney failure, unspecified: Secondary | ICD-10-CM

## 2024-02-17 LAB — GLUCOSE, CAPILLARY
Glucose-Capillary: 146 mg/dL — ABNORMAL HIGH (ref 70–99)
Glucose-Capillary: 191 mg/dL — ABNORMAL HIGH (ref 70–99)
Glucose-Capillary: 174 mg/dL — ABNORMAL HIGH (ref 70–99)
Glucose-Capillary: 187 mg/dL — ABNORMAL HIGH (ref 70–99)

## 2024-02-17 MED ORDER — CHOLESTYRAMINE 4 G PO PACK
4.0000 g | PACK | Freq: Two times a day (BID) | ORAL | Status: DC
  Administered 2024-02-17 – 2024-02-19 (×5): 4 g via ORAL
  Filled 2024-02-17 (×6): qty 1

## 2024-02-17 MED ORDER — INSULIN GLARGINE-YFGN 100 UNIT/ML ~~LOC~~ SOLN
40.0000 [IU] | Freq: Every day | SUBCUTANEOUS | Status: AC
  Administered 2024-02-17: 40 [IU] via SUBCUTANEOUS
  Filled 2024-02-17: qty 0.4

## 2024-02-17 MED ORDER — GERHARDT'S BUTT CREAM
TOPICAL_CREAM | Freq: Two times a day (BID) | CUTANEOUS | Status: DC
  Filled 2024-02-17: qty 60

## 2024-02-17 MED ORDER — INSULIN ASPART 100 UNIT/ML IJ SOLN
3.0000 [IU] | Freq: Three times a day (TID) | INTRAMUSCULAR | Status: DC
  Administered 2024-02-17 – 2024-02-19 (×7): 3 [IU] via SUBCUTANEOUS

## 2024-02-17 MED ORDER — INSULIN GLARGINE-YFGN 100 UNIT/ML ~~LOC~~ SOLN
10.0000 [IU] | Freq: Once | SUBCUTANEOUS | Status: AC
  Administered 2024-02-17: 10 [IU] via SUBCUTANEOUS
  Filled 2024-02-17: qty 0.1

## 2024-02-17 NOTE — Progress Notes (Addendum)
 HD#2 SUBJECTIVE:  Patient Summary: Barbara Wang is a 58 y.o. 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 We had a longer discussion about the chronic nature of her diarrhea today, which has been going on for years, not associated with cramping, abdominal pain, but with urgency. Volume varies as well. She reports having a cholecystectomy. Upon chart review this was in 2013.  At home she takes one dose of of loperamide , which is not often sufficient, so she takes a second one and this offers better relief.  Her shades were down, so she couldn't tell it was the morning. She is unable to read the clock numbers or calendar because impaired vision.  OBJECTIVE:  Vital Signs: Vitals:   02/17/24 0424 02/17/24 0834 02/17/24 1245 02/17/24 1925  BP: 105/70 101/64 122/69 101/63  Pulse: 81 95 75 85  Resp: 18 18 16 16   Temp: 98.2 F (36.8 C) 99 F (37.2 C) 98.1 F (36.7 C) 98.5 F (36.9 C)  TempSrc: Oral Oral  Oral  SpO2: 93% 96% 94% 95%  Weight:      Height:       Supplemental O2: Room Air SpO2: 95 %  Filed Weights   02/14/24 1151  Weight: 76.7 kg     Intake/Output Summary (Last 24 hours) at 02/17/2024 2037 Last data filed at 02/16/2024 2208 Gross per 24 hour  Intake 240 ml  Output --  Net 240 ml   Net IO Since Admission: 11,519.08 mL [02/17/24 2037]  Physical Exam: Woman appearing older than stated age laying in bed in NAD Moist mucus membranes Heart sounds with RRR, without  murmur Clear to auscultation bilaterally Soft, non tender abdomen with normal bowel sounds No lower extremity edema Warm and dry skin Continues to be alert and answering questions appropriately, honest about the lapses in her memories in distant past Pleasant mood and affect   Pertinent labs and imaging:      Latest Ref Rng & Units 02/15/2024    5:52 AM 02/14/2024   11:21 AM 09/24/2023   11:26 AM  CBC  WBC 4.0 - 10.5 K/uL 7.5  6.8  6.7   Hemoglobin 12.0 - 15.0 g/dL 87.1  86.6  87.3   Hematocrit 36.0 - 46.0 % 36.9  39.5  39.5   Platelets 150 - 400 K/uL 327  306  355        Latest Ref Rng & Units 02/16/2024    5:52 AM 02/15/2024    5:52 AM 02/14/2024   11:36 PM  CMP  Glucose 70 - 99 mg/dL 656  84  801   BUN 6 - 20 mg/dL 24  32  38   Creatinine 0.44 - 1.00 mg/dL 8.66  8.30  8.07   Sodium 135 - 145 mmol/L 136  141  137   Potassium 3.5 - 5.1 mmol/L 4.1  4.2  3.3   Chloride 98 - 111 mmol/L 105  106  103   CO2 22 - 32 mmol/L 23  25  22    Calcium  8.9 - 10.3 mg/dL 9.2  9.7  9.5  ASSESSMENT/PLAN:  Assessment: Principal Problem:   Hyperglycemia Active Problems:   Poorly controlled type 2 diabetes mellitus with complication (HCC)   Hypovolemia due to dehydration   Acute kidney injury (HCC)   Impaired mobility   Chronic diarrhea   Essential hypertension   Subclinical hyperthyroidism   Urinary incontinence due diabetic neuropathy   UTI (urinary tract infection)   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 A1c pending. cBGs continue to be uncontrolled; required 20u Novolog  on 6/21  - Continue Semglee  40 units at bedtime> 50 units at bedtime  - Will add extra 10 units this AM + 40 units tonight - 3 units Novolog  with meals  - Change to 50 units at bedtime on 6/23  - Resistant SSI - Nightly correction insulin  - Patient qualifies for Free style  libre on outpatient setting  AKI, resolving Due to osmotic diuresis secondary to presenting hyperglycemia and renally acting antihypertensives. Now improving. Increased PO intake - I/Os - Avoiding nephrotoxic medications - Monitoring PO intake - Monitor BMP until until baseline  Hematuria Chronic. No stone on CT study. Treating for UTI above.  - Will need repeat urine studies at follow up - If persistent, will need urologic evaluation  Urinary incontinence Fecal incontinence Chronic. 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 Cerebella atrophy with gait instability. - Contine ASA 81 mg daily - Continue Crestor 40 mg daily  Subclinical hyperthyroidism Currently asymptomatic.  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  - Follow up urine cultures - Treated with IV CTX  Disposition -Pending SNF selection, approval, and bed availability   Best Practice: Diet: Diabetic diet VTE: enoxaparin  (LOVENOX ) injection 40 mg Start: 02/15/24 1000 SCDs Start: 02/15/24 9352 Code: Full  Disposition planning: Therapy Recs: SNF, DME: straight cane Family Contact: Daughters to be notified DISPO: Anticipated discharge in 2 days to Skilled nursing facility pending SNF selection, approval, and bed availability.  Signature:  Mliss Arlean Trudy Jolynn [facility] Res[facility]  8:37 PM, 02/17/2024  On Call pager [phone]

## 2024-02-17 NOTE — TOC Progression Note (Signed)
 Transition of Care [facility]) - Progression Note    Patient Details  Name: Barbara Wang MRN: [id number] Date of Birth: Apr 21, 1966  Transition [facility]) CM/SW Contact  Gwenn Frieze [place], [place] Phone Number: 02/17/2024, 2:04 PM  Clinical Narrative: Provided pt with current SNF offers: [facility] and [facility] [address] [facility] [place] [facility] [place]. Pt declining current offers and is requesting to see if additional offers available Monday before deciding on SNF vs home. MD updated.   Frieze Gwenn, MSW, LCSW [phone] (coverage)      Expected Discharge Plan: Skilled Nursing Facility Barriers to Discharge: Continued Medical Work up, English as a second language teacher  Expected Discharge Plan and Services   Discharge Planning Services: CM Consult Post Acute Care Choice: Skilled Nursing Facility Living arrangements for the past 2 months: Apartment                                       Social Determinants of Health (SDOH) Interventions SDOH Screenings   Food Insecurity: Food Insecurity Present (10/09/2022)  Housing: Low Risk  (08/23/2023)  Transportation Needs: No Transportation Needs (08/24/2023)  Utilities: Not At Risk (08/23/2023)  Depression (PHQ2-9): High Risk (09/24/2023)  Financial Resource Strain: Low Risk  (07/31/2023)  Physical Activity: Inactive (07/31/2023)  Social Connections: Unknown (01/06/2022)   Received from [facility]  Stress: No Stress Concern Present (07/31/2023)  Tobacco Use: High Risk (02/14/2024)  Health Literacy: Adequate Health Literacy (07/31/2023)    Readmission Risk Interventions     No data to display

## 2024-02-18 LAB — URINALYSIS, ROUTINE W REFLEX MICROSCOPIC
Bilirubin Urine: NEGATIVE
Glucose, UA: 50 mg/dL — AB
Ketones, ur: NEGATIVE mg/dL
Nitrite: NEGATIVE
Protein, ur: 30 mg/dL — AB
Specific Gravity, Urine: 1.013 (ref 1.005–1.030)
WBC, UA: >50 WBC/hpf (ref 0–5)
pH: 5 (ref 5.0–8.0)

## 2024-02-18 LAB — RENAL FUNCTION PANEL
Albumin: 2.8 g/dL — ABNORMAL LOW (ref 3.5–5.0)
Anion gap: 10 (ref 5–15)
BUN: 16 mg/dL (ref 6–20)
CO2: 23 mmol/L (ref 22–32)
Calcium: 9.3 mg/dL (ref 8.9–10.3)
Chloride: 108 mmol/L (ref 98–111)
Creatinine, Ser: 1.23 mg/dL — ABNORMAL HIGH (ref 0.44–1.00)
GFR, Estimated: 50 mL/min — ABNORMAL LOW (ref >60)
Glucose, Bld: 156 mg/dL — ABNORMAL HIGH (ref 70–99)
Phosphorus: 3.6 mg/dL (ref 2.5–4.6)
Potassium: 3.6 mmol/L (ref 3.5–5.1)
Sodium: 141 mmol/L (ref 135–145)

## 2024-02-18 LAB — HEMOGLOBIN A1C
Hgb A1c MFr Bld: >15.5 % — ABNORMAL HIGH (ref 4.8–5.6)
Mean Plasma Glucose: >398 mg/dL

## 2024-02-18 LAB — MAGNESIUM: Magnesium: 1.9 mg/dL (ref 1.7–2.4)

## 2024-02-18 LAB — GLUCOSE, CAPILLARY
Glucose-Capillary: 141 mg/dL — ABNORMAL HIGH (ref 70–99)
Glucose-Capillary: 149 mg/dL — ABNORMAL HIGH (ref 70–99)
Glucose-Capillary: 175 mg/dL — ABNORMAL HIGH (ref 70–99)
Glucose-Capillary: 147 mg/dL — ABNORMAL HIGH (ref 70–99)

## 2024-02-18 LAB — MRSA NEXT GEN BY PCR, NASAL: MRSA by PCR Next Gen: NOT DETECTED

## 2024-02-18 MED ORDER — INSULIN GLARGINE-YFGN 100 UNIT/ML ~~LOC~~ SOLN
50.0000 [IU] | Freq: Every day | SUBCUTANEOUS | Status: DC
  Administered 2024-02-18: 50 [IU] via SUBCUTANEOUS
  Filled 2024-02-18 (×2): qty 0.5

## 2024-02-18 MED ORDER — LOPERAMIDE HCL 2 MG PO CAPS
2.0000 mg | ORAL_CAPSULE | Freq: Every day | ORAL | Status: DC
  Administered 2024-02-18 – 2024-02-19 (×2): 2 mg via ORAL
  Filled 2024-02-18 (×2): qty 1

## 2024-02-18 NOTE — Progress Notes (Cosign Needed Addendum)
 HD#4 SUBJECTIVE:  Patient Summary: Barbara Wang is a 58 y.o. 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  Waiting on dispo planning.  Overnight Events: None.   Interim History:  Examined at the bedside, she continues to have loose stools. Denies on going abdominal pain.  She is declining SNF bed offers from [facility] an the [place]. She says he prefers to go home instead.  Vital Signs: Vitals:   02/17/24 1950 02/18/24 0400 02/18/24 0852 02/18/24 0927  BP: 101/63 112/70  115/75  Pulse: 85 70  95  Resp: 16 16  16   Temp: 98.5 F (36.9 C) 97.6 F (36.4 C)  98.1 F (36.7 C)  TempSrc:  Oral    SpO2: 97% 97% 97% 96%  Weight:      Height:       Supplemental O2: Room Air SpO2: 96 %  Filed Weights   02/14/24 1151  Weight: 76.7 kg    No intake or output data in the 24 hours ending 02/18/24 1603  Net IO Since Admission: 11,519.08 mL [02/18/24 1603]  Physical Exam:  Lying in bed, not in acute distress. Heart regular rate and rhythm, no murmurs Abdomen soft and nontender.  Normal bowel sounds.  Pertinent labs and imaging:      Latest Ref Rng & Units 02/15/2024    5:52 AM 02/14/2024   11:21 AM 09/24/2023   11:26 AM  CBC  WBC 4.0 - 10.5 K/uL 7.5  6.8  6.7   Hemoglobin 12.0 - 15.0 g/dL 87.1  86.6  87.3   Hematocrit 36.0 - 46.0 % 36.9  39.5  39.5   Platelets 150 - 400 K/uL 327  306  355        Latest Ref Rng & Units 02/18/2024   10:33 AM 02/16/2024    5:52 AM 02/15/2024    5:52 AM  CMP  Glucose 70 - 99 mg/dL 843  656  84   BUN 6 - 20 mg/dL 16  24  32   Creatinine 0.44 - 1.00 mg/dL 8.76  8.66  8.30   Sodium 135 - 145  mmol/L 141  136  141   Potassium 3.5 - 5.1 mmol/L 3.6  4.1  4.2   Chloride 98 - 111 mmol/L 108  105  106   CO2 22 - 32 mmol/L 23  23  25    Calcium  8.9 - 10.3 mg/dL 9.3  9.2  9.7       ASSESSMENT/PLAN:  Assessment: Principal Problem:   Hyperglycemia Active Problems:   Poorly controlled type 2 diabetes mellitus with complication (HCC)   Essential hypertension   Hypovolemia due to dehydration   UTI (urinary tract infection)   Acute kidney injury (HCC)   Subclinical hyperthyroidism   Urinary incontinence due diabetic neuropathy   Impaired mobility   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  A1c 6 months ago was 9.5, today is 15.5 Severe elevation in A1c is in the setting of non adherence.  She would benefit from getting extra support with medication management.  However patient has consistently  Addendum:  Patient is  alert, oriented, and demonstrates capacity to make informed medical decisions. She  understand her medical condition, the recommendation for SNF placement, associated risks and benefits, and have clearly and consistently chosen to return home instead. No signs of impaired judgment noted.  - Continue Semglee  50 units at bedtime. - SSI. - Patient qualifies for Free style libre on outpatient setting  AKI, resolved  SCR 1.23, (baseline SCr 1.1-1.3.).  Etiology of patient's AKI was in the setting of osmotic diuresis from hyperglycemia.  - Avoiding nephrotoxic medications - Monitoring PO intake - Monitor BMP until until baseline  ? UTI Hematuria Pyuria Repeat urinalysis demonstrated persistent leukocytes and rare bacteria; nitrites were negative. The specimen was positive for budding yeast. The patient has completed a 3-day course of intravenous ceftriaxone . Persistent pyuria may indicate resistant  organisms/pathogens.  Her previous urine culture showed no growth to date. -Follow-up on urine culture.  Urinary incontinence Fecal incontinence Prior history of cholecystectomy, chronic diarrhea, yesterday, she was started on cholestyramine, did not appreciably improve her diarrhea.  She is on the twice daily dosing.  - Could increase cholestyramine 4 g to 3 times daily. - Adult briefs vs PurWik when briefs unavailable - Scheduled Imodium .  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 Cerebella atrophy with gait instability.  Patient ambulated with physical therapy, PT recommending SNF . Patient is  declining SNF bed offers, patient prefers to go home instead.  Patient cannot get home health, but can get outpatient PT.  PT not recommending any equipment, she has a cane at bedside , but may want a walker to go home with.  - Contine ASA 81 mg daily - Continue Crestor 40 mg daily  Subclinical hyperthyroidism Currently asymptomatic.  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  Best Practice: Diet: Diabetic diet VTE: enoxaparin  (LOVENOX ) injection 40 mg Start: 02/15/24 1000 SCDs Start: 02/15/24 9352 Code: Full  Disposition planning: Home versus SNF pending conversation with the daughter later this evening.  Signature: Missy Celestina Jolynn [facility] Internal Medicine Residency-PGY 1

## 2024-02-18 NOTE — Progress Notes (Signed)
 Mobility Specialist: Progress Note   02/18/24 1628  Mobility  Activity Ambulated with assistance in hallway  Level of Assistance Standby assist, set-up cues, supervision of patient - no hands on  Assistive Device Front wheel walker  Distance Ambulated (ft) 200 ft  Activity Response Tolerated well  Mobility Referral Yes  Mobility visit 1 Mobility  Mobility Specialist Start Time (ACUTE ONLY) 1450  Mobility Specialist Stop Time (ACUTE ONLY) 1500  Mobility Specialist Time Calculation (min) (ACUTE ONLY) 10 min    Received pt in bed having no complaints and agreeable to mobility. Pt was asymptomatic throughout ambulation and returned to room w/o fault. Left in bed w/ call bell in reach and all needs met.   Ileana Lute Mobility Specialist Please contact via SecureChat or Rehab office at [phone]

## 2024-02-18 NOTE — Plan of Care (Signed)
  Problem: Nutritional: Goal: Maintenance of adequate nutrition will improve Outcome: Progressing   Problem: Skin Integrity: Goal: Risk for impaired skin integrity will decrease Outcome: Progressing   Problem: Education: Goal: Knowledge of General Education information will improve Description: Including pain rating scale, medication(s)/side effects and non-pharmacologic comfort measures Outcome: Progressing   

## 2024-02-18 NOTE — TOC Progression Note (Signed)
 Transition of Care [facility]) - Progression Note    Patient Details  Name: Barbara Wang MRN: [id number] Date of Birth: Jan 26, 1966  [facility]) CM/SW Contact  Amily Depp A [place], LCSW Phone Number: 02/18/2024, 2:02 PM  Clinical Narrative:     CSW met with pt at bedside. No other bed offers, pt adamant about not going to the 2 listed placements. CSW explained pt could follow up outpatient for PT but could not receive home health because of her insurance. She said she would need assistance with transportation, CSW provided transportation resource to assist. Provider and RNCM notified.    TOC will continue to follow.   Expected Discharge Plan: Skilled Nursing Facility Barriers to Discharge: Continued Medical Work up, English as a second language teacher  Expected Discharge Plan and Services   Discharge Planning Services: CM Consult Post Acute Care Choice: Skilled Nursing Facility Living arrangements for the past 2 months: Apartment                                       Social Determinants of Health (SDOH) Interventions SDOH Screenings   Food Insecurity: Food Insecurity Present (10/09/2022)  Housing: Low Risk  (08/23/2023)  Transportation Needs: No Transportation Needs (08/24/2023)  Utilities: Not At Risk (08/23/2023)  Depression (PHQ2-9): High Risk (09/24/2023)  Financial Resource Strain: Low Risk  (07/31/2023)  Physical Activity: Inactive (07/31/2023)  Social Connections: Unknown (01/06/2022)   Received from [facility]  Stress: No Stress Concern Present (07/31/2023)  Tobacco Use: High Risk (02/14/2024)  Health Literacy: Adequate Health Literacy (07/31/2023)    Readmission Risk Interventions     No data to display

## 2024-02-18 NOTE — TOC Progression Note (Signed)
 Transition of Care [facility]) - Progression Note    Patient Details  Name: Barbara Wang MRN: [id number] Date of Birth: 1966-07-02  Transition of Care [facility]) CM/SW Contact  Rosaline JONELLE Joe, RN Phone Number: 02/18/2024, 2:51 PM  Clinical Narrative:    I called and spoke to the patient's daughter and she plans to speak to the patient at the bedside about admitting to SNF facility.  Daughter is aware of the SNF offers and will follow up with MSW this afternoon.   Expected Discharge Plan: Skilled Nursing Facility Barriers to Discharge: Continued Medical Work up, English as a second language teacher  Expected Discharge Plan and Services   Discharge Planning Services: CM Consult Post Acute Care Choice: Skilled Nursing Facility Living arrangements for the past 2 months: Apartment                                       Social Determinants of Health (SDOH) Interventions SDOH Screenings   Food Insecurity: Food Insecurity Present (10/09/2022)  Housing: Low Risk  (08/23/2023)  Transportation Needs: No Transportation Needs (08/24/2023)  Utilities: Not At Risk (08/23/2023)  Depression (PHQ2-9): High Risk (09/24/2023)  Financial Resource Strain: Low Risk  (07/31/2023)  Physical Activity: Inactive (07/31/2023)  Social Connections: Unknown (01/06/2022)   Received from [facility]  Stress: No Stress Concern Present (07/31/2023)  Tobacco Use: High Risk (02/14/2024)  Health Literacy: Adequate Health Literacy (07/31/2023)    Readmission Risk Interventions     No data to display

## 2024-02-18 NOTE — Progress Notes (Signed)
 Physical Therapy Treatment Patient Details Name: Barbara Wang MRN: [id number] DOB: September 03, 1965 Today's Date: 02/18/2024   History of Present Illness Ms. Rody is a 58 year old female who presents with 4 days of progressively worsening generalized weakness. Past medical history significant for uncontrolled insulin -dependent T2DM, hypertension.    PT Comments  Patient progressing with balance activities and increased ambulation though endorsed fatigue needing two standing rest breaks.  She also demonstrates shuffling gait pattern indicative of fall risk.  She was encouraged to participate in balance activities and seated LE therex which was issued for HEP.  She remains appropriate for inpatient rehab (<3 hours/day) at d/c.  PT will continue to follow.    If plan is discharge home, recommend the following: A little help with walking and/or transfers;A little help with bathing/dressing/bathroom;Direct supervision/assist for financial management;Direct supervision/assist for medications management;Assist for transportation;Help with stairs or ramp for entrance   Can travel by private vehicle     Yes  Equipment Recommendations  None recommended by PT    Recommendations for Other Services       Precautions / Restrictions Precautions Precautions: Fall Recall of Precautions/Restrictions: Intact     Mobility  Bed Mobility         Supine to sit: Modified independent (Device/Increase time)          Transfers Overall transfer level: Needs assistance Equipment used: Rolling walker (2 wheels) Transfers: Sit to/from Stand Sit to Stand: Supervision           General transfer comment: S for safety    Ambulation/Gait Ambulation/Gait assistance: Contact guard assist Gait Distance (Feet): 150 Feet Assistive device: Rolling walker (2 wheels) Gait Pattern/deviations: Step-through pattern, Decreased stride length, Trunk flexed, Shuffle       General Gait Details:  stopped x 2 for standing rest in hallway reporting fatigue; wearing slip on sandals with socks shuffling pattern   Stairs             Wheelchair Mobility     Tilt Bed    Modified Rankin (Stroke Patients Only)       Balance Overall balance assessment: Needs assistance   Sitting balance-Leahy Scale: Good     Standing balance support: No upper extremity supported Standing balance-Leahy Scale: Fair Standing balance comment: static standing without UE support with S, needs A for dynamic mobility               High Level Balance Comments: standing squats x 2, heel raises x 2 all with UE support, side stepping at the sink with UE support            Communication Communication Communication: No apparent difficulties  Cognition Arousal: Alert Behavior During Therapy: WFL for tasks assessed/performed   PT - Cognitive impairments: No apparent impairments                         Following commands: Intact      Cueing Cueing Techniques: Verbal cues, Tactile cues  Exercises General Exercises - Lower Extremity Long Arc Quad: Strengthening, Both, 5 reps, Seated Hip Flexion/Marching: Strengthening, Seated, Both, 5 reps Heel Raises: Strengthening, 5 reps, Seated, Both    General Comments General comments (skin integrity, edema, etc.): VSS      Pertinent Vitals/Pain Pain Assessment Pain Assessment: Faces Faces Pain Scale: Hurts little more Pain Location: arms from having blood draw Pain Descriptors / Indicators: Sore Pain Intervention(s): Monitored during session    Home Living  Prior Function            PT Goals (current goals can now be found in the care plan section) Progress towards PT goals: Progressing toward goals    Frequency    Min 3X/week      PT Plan      Co-evaluation              AM-PAC PT 6 Clicks Mobility   Outcome Measure  Help needed turning from your back to your side  while in a flat bed without using bedrails?: A Little Help needed moving from lying on your back to sitting on the side of a flat bed without using bedrails?: A Little Help needed moving to and from a bed to a chair (including a wheelchair)?: A Little Help needed standing up from a chair using your arms (e.g., wheelchair or bedside chair)?: A Little Help needed to walk in hospital room?: A Little Help needed climbing 3-5 steps with a railing? : A Little 6 Click Score: 18    End of Session Equipment Utilized During Treatment: Gait belt Activity Tolerance: Patient limited by fatigue Patient left: in bed;with call bell/phone within reach;with bed alarm set (sitting on EOB)   PT Visit Diagnosis: Other abnormalities of gait and mobility (R26.89)     Time: 8879-8854 PT Time Calculation (min) (ACUTE ONLY): 25 min  Charges:    $Gait Training: 8-22 mins $Neuromuscular Re-education: 8-22 mins PT General Charges $$ ACUTE PT VISIT: 1 Visit                     Micheline Portal, PT Acute Rehabilitation Services [phone] 02/18/2024    Montie Portal 02/18/2024, 12:46 PM

## 2024-02-19 ENCOUNTER — Other Ambulatory Visit (HOSPITAL_COMMUNITY): Payer: Self-pay

## 2024-02-19 ENCOUNTER — Telehealth (HOSPITAL_COMMUNITY): Payer: Self-pay | Admitting: Pharmacy Technician

## 2024-02-19 LAB — URINE CULTURE

## 2024-02-19 LAB — GLUCOSE, CAPILLARY
Glucose-Capillary: 180 mg/dL — ABNORMAL HIGH (ref 70–99)
Glucose-Capillary: 189 mg/dL — ABNORMAL HIGH (ref 70–99)
Glucose-Capillary: 165 mg/dL — ABNORMAL HIGH (ref 70–99)

## 2024-02-19 LAB — VITAMIN B1: Vitamin B1 (Thiamine): 110.3 nmol/L (ref 66.5–200.0)

## 2024-02-19 MED ORDER — LANCET DEVICE MISC
1.0000 | Freq: Three times a day (TID) | 0 refills | Status: AC
  Filled 2024-02-19 (×2): qty 1, 30d supply, fill #0

## 2024-02-19 MED ORDER — ACCU-CHEK SOFTCLIX LANCETS MISC
1.0000 | Freq: Three times a day (TID) | 0 refills | Status: AC
  Filled 2024-02-19 (×2): qty 100, 30d supply, fill #0

## 2024-02-19 MED ORDER — FREESTYLE LIBRE 3 READER DEVI
1.0000 [IU] | Freq: Every day | 3 refills | Status: DC
  Filled 2024-02-19: qty 1, fill #0
  Filled 2024-02-19 – 2024-08-15 (×2): qty 1, 30d supply, fill #0
  Filled 2024-08-18: qty 1, 365d supply, fill #0

## 2024-02-19 MED ORDER — ACCU-CHEK GUIDE ME W/DEVICE KIT
1.0000 | PACK | Freq: Three times a day (TID) | 0 refills | Status: DC
  Filled 2024-02-19: qty 1, 30d supply, fill #0

## 2024-02-19 MED ORDER — BLOOD GLUCOSE TEST VI STRP
1.0000 | ORAL_STRIP | Freq: Three times a day (TID) | 0 refills | Status: AC
  Filled 2024-02-19: qty 100, 34d supply, fill #0

## 2024-02-19 MED ORDER — INSULIN DEGLUDEC 200 UNIT/ML ~~LOC~~ SOPN
60.0000 [IU] | PEN_INJECTOR | Freq: Every day | SUBCUTANEOUS | 0 refills | Status: DC
  Filled 2024-02-19 (×3): qty 9, 30d supply, fill #0

## 2024-02-19 MED ORDER — POTASSIUM CHLORIDE CRYS ER 20 MEQ PO TBCR
40.0000 meq | EXTENDED_RELEASE_TABLET | Freq: Once | ORAL | Status: AC
  Administered 2024-02-19: 40 meq via ORAL
  Filled 2024-02-19: qty 2

## 2024-02-19 MED ORDER — FREESTYLE LIBRE 3 SENSOR MISC
3 refills | Status: DC
  Filled 2024-02-19: qty 6, fill #0
  Filled 2024-02-19 – 2024-03-27 (×2): qty 2, 30d supply, fill #0
  Filled 2024-04-30: qty 2, 30d supply, fill #1
  Filled 2024-05-19 – 2024-05-23 (×2): qty 2, 30d supply, fill #2
  Filled 2024-07-25: qty 2, 30d supply, fill #3
  Filled 2024-08-15 – 2024-09-04 (×3): qty 2, 30d supply, fill #4

## 2024-02-19 MED ORDER — INSULIN PEN NEEDLE 32G X 4 MM MISC
1.0000 [IU] | Freq: Three times a day (TID) | 0 refills | Status: DC
  Filled 2024-02-19: qty 100, fill #0
  Filled 2024-02-19: qty 100, 30d supply, fill #0

## 2024-02-19 NOTE — Discharge Summary (Addendum)
 Name: Barbara Wang MRN: [id number] DOB: [birth year] 58 y.o. PCP: Barbara Wang ORN, NP  Date of Admission: 02/14/2024 11:11 AM Date of Discharge: 02/19/2024 Attending Physician: Dr. 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 Commonly known as: ZESTORETIC  Take 1 tablet by mouth daily.   potassium chloride  SA 20 MEQ tablet Wait to take this until your doctor or other care provider tells you to start again. Commonly known as: KLOR-CON  M Take 1 tablet (20 mEq total) by mouth 2 (two) times daily.       STOP taking these medications    BASAGLAR  KWIKPEN [place]   guaifenesin  400 MG Tabs tablet Commonly known as: HUMIBID E   HumaLOG  KwikPen 100 UNIT/ML KwikPen Generic drug: insulin  lispro       TAKE these medications    Accu-Chek Guide Test test strip Generic drug: glucose blood Use to check blood sugar 3 times daily. What changed: Another medication with the same name was added. Make sure you understand how and when to take each.   Accu-Chek Guide Test test strip Generic drug:  glucose blood Use to check blood glucose in the morning, at noon, and at bedtime. What changed: You were already taking a medication with the same name, and this prescription was added. Make sure you understand how and when to take each.   Accu-Chek Guide w/Device Kit Use to check blood sugar 3 times daily. What changed: Another medication with the same name was added. Make sure you understand how and when to take each.   Accu-Chek Guide w/Device Kit Use to check blood glucose in the morning, at noon, and at bedtime. What changed: You were already taking a medication with the same name, and this prescription was added. Make sure you understand how and when to take each.   Accu-Chek Softclix Lancets lancets Use to check blood sugar 3 times daily. What changed: Another medication with the same name was added. Make sure you understand how and when to take each.   Accu-Chek Softclix Lancets lancets Use in the morning, at noon, and at bedtime What changed: You were already taking a medication with the same name, and this prescription was added. Make sure you understand how and when to take each.   Aspirin  Low Dose 81 MG tablet Generic drug: aspirin  EC Take 1 tablet (81 mg total) by mouth daily.   atorvastatin  40 MG tablet Commonly known as: LIPITOR Take 1 tablet (40 mg total) by mouth daily at 6 PM.   budesonide -formoterol  160-4.5 MCG/ACT inhaler Commonly known as: SYMBICORT   Inhale 2 puffs into the lungs 2 (two) times daily.   fluticasone -salmeterol 230-21 MCG/ACT inhaler Commonly known as: Advair  HFA Inhale 2 puffs into the lungs 2 (two) times daily.   FreeStyle Libre 3 Reader [place] Use to check blood sugars continuously daily.   FreeStyle Libre 3 Sensor Misc Place 1 sensor on the skin every 15 days. Use to check glucose continuously   gabapentin  600 MG tablet Commonly known as: Neurontin  Take 1 tablet (600 mg total) by mouth daily as needed (for neuropathy in hands and  feet).   insulin  degludec 200 UNIT/ML FlexTouch Pen Commonly known as: TRESIBA Inject 60 Units into the skin daily.   Lancet Device Misc 1 each by Does not apply route in the morning, at noon, and at bedtime. May substitute to any manufacturer covered by patient's insurance.   loperamide  2 MG tablet Commonly known as: IMODIUM  A-D Take 2 mg by mouth 4 (four) times daily as needed for diarrhea or loose stools.   mometasone  0.1 % cream Commonly known as: ELOCON  Apply topically daily.   omega-3 acid ethyl esters 1 g capsule Commonly known as: Lovaza  Take 1 capsule (1 g total) by mouth daily.   Ozempic  (1 MG/DOSE) 4 MG/3ML Sopn Generic drug: Semaglutide  (1 MG/DOSE) Inject 1 mg into the skin once a week.   pantoprazole  40 MG tablet Commonly known as: PROTONIX  Take 1 tablet (40 mg total) by mouth daily.   sertraline  100 MG tablet Commonly known as: ZOLOFT  Take 1 tablet (100 mg total) by mouth daily FOR DEPRESSION. What changed:  when to take this reasons to take this   TechLite Pen Needles 32G X 4 MM Misc Generic drug: Insulin  Pen Needle Use with insulin , 3 (three) times daily.   trazodone  300 MG tablet Commonly known as: DESYREL  Take 1 tablet (300 mg total) by mouth at bedtime. What changed:  how much to take when to take this reasons to take this   Ventolin  HFA 108 (90 Base) MCG/ACT inhaler Generic drug: albuterol  Inhale 1-2 puffs into the lungs every 6 (six) hours as needed for wheezing or shortness of breath.   albuterol  (2.5 MG/3ML) 0.083% nebulizer solution Commonly known as: PROVENTIL  Take 3 mLs (2.5 mg total) by nebulization every 6 (six) hours as needed for wheezing or shortness of breath.        Disposition and follow-up:   Ms.Barbara Wang was discharged from [facility] in [place] condition.  At the hospital follow up visit please address:  1.  Follow-up:  a.  Uncontrolled type 2 diabetes. A1c 9.5>>15.5  I have adjusted her  insulin  regimen, perhaps this will improve compliance. I have discontinued insulin  with meals, and I have increased her long-acting to 60 units of Tresiba daily. -Please confirm patient compliance. Please confirm patient is aware of these additional resources:  - [employer] transportation for medical appointment - Patient care services paperwork completed and faxed to medicaid. - Outpatient PT referral placed.  Patient unable to get home health due to patient's cirrhosis and lack of PT staffing within the area.   b.  Persistent pyuria and mild hematuria.  she received 3 days of IV ceftriaxone  due to UA finding.  Repeat UA still shows persistent pyuria.  Urine culture with no growth to date.  Persistent pyuria likely secondary to uncovered pathogens versus resistance. -Please get a repeat UA at follow-up.   c.  Subclinical hyperparathyroidism Currently asymptomatic, please repeat TSH and T4 within 4-6 weeks.  2.  Labs / imaging needed at time of follow-up: CBC, BMP, UA  3.  Pending labs/ test needing follow-up: None  4.  Medication Changes Abx -   End Date:  Follow-up Appointments:  Contact information for follow-up providers     [employer] Follow up.   Why: Call 2-3 days in advance to schedule transportation for appointments Contact information: [phone]        [facility] Follow up.   Specialty: Rehabilitation Why: Please call and follow up at the OUtpatient rehabilitation facility for OUtpatient PT services. Contact information: [address] [facility]  [address] [place]  [address] [phone]        Barbara Wang ORN, NP Follow up.   Specialty: Nurse Practitioner Why: Call the office and schedule a hospital follow up in the next 7-10 days. Contact information: [address] [place] [place] [address] [phone]              Contact information for  after-discharge care     Destination     [facility] .   Service: Skilled Nursing Contact information: [address] [place]  [address] [phone]                     Hospital Course by problem list: Anaalicia SCHERRY LAVERNE is a 58 y.o. female with PMHx of uncontrolled insulin -dependent type 2 diabetes, hypertension, hyperlipidemia presented to the ED with generalized weakness, and admitted for hyperglycemia.   Principal Problem:   Hyperglycemia   # Hyperglycemia # Metabolic acidosis # Pseudohyponatremia Patient presented to the ED with generalized weakness.  On presentation blood BG >600, low bicarb, and elevated anion gap.  UA negative for urinary ketones, and serum beta hydroxybutyrate was negative.  Patient does not in DKA, serum osmolarity <320, not in HHS as well.  Hyperglycemia improved with IV fluids, and subcutaneous insulin .  Patient hyperglycemia likely in the setting of known adherence, HbA1c 15.5.  UA was also notable for pyuria and microhematuria, and rare bacteria, received treatment with 3-day course of IV ceftriaxone .  Repeat UA shows persistent pyuria and microhematuria.  Patient denies urinary symptoms.  At home, she was on Lantus  40U BID, and 12U of NovoLog  with meals; however she has been intermittently nonadherent.  To help with compliance, we have discontinued her NovoLog .  Will switch to Tresiba 200 units 60 units daily.  After communicating with social work and case worker, additional interventions will put in place to ensure patient can have versus discharge home, as she had decline discharged to SNF. - Patient has [employer] transportation for medical appointment. - Patient care services paperwork completed and faxed to medicaid. Daughter advised to follow up on it. - Outpatient PT referral placed.  Patient unable to get home health due to patient's cirrhosis and lack of PT staffing within the  area.  # AKI Prerenal etiology, 2/2 to  hyperglycemia-associated osmotic diuresis which led to acute kidney injury.  AKI improved with IV fluids, on day of discharge, SCr, was back to her baseline.  # Uncontrolled type 2 diabetes A1c 15.5, from 9.5 about 6 months ago.  We have made some changes to her diabetic regimen, to ensure adherence.  I spoke with patient's daughter, who provide assistance with daily insulin  restriction. - Provided resources for transportation to and from appointment for patient.  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 These findings are stable and unchanged compared to the MRI dated 12/24.  Folate and vitamin B12 both normal.  Patient worked with physical therapy and Occupational Therapy, PT did not note any particularly unsteady or ataxic gait.  - Continued with aspirin  81 mg and Crestor 40 mg.    # Essential Hypertension. Held home antihypertensive due to AKI.  Blood pressure remained normotensive. - Holding antihypertensive until follow-up with PCP.   #Pulmonary hypertension CT 06/24 showed enlarged pulmonary trunk indicative of pulm arterial hypertension.  I suspect this is from the underlying lung disease.  Could consider outpatient pulmonology follow-up.   Chronic medical conditions:   Depression: Continued with sertraline  50 mg. Insomnia: Continued with trazodone  300 mg.   Discharge Subjective: Evaluated at bedside, says she is feeling overall very well.  Patient communicated clearly that she does not want to go to SNF, spoke with daughter, case Production designer, theatre/television/film and social worker to develop a safe discharge to home.  Discharge Exam:   BP (!) 152/88 (BP Location: Left Arm)   Pulse 94   Temp 98.1 F (36.7 C)   Resp 18   Ht 5'  4 (1.626 m)   Wt 76.7 kg   SpO2 98%   BMI 29.01 kg/m   Constitutional: Pleasant, NAD. Cardiovascular: regular rate and rhythm, no m/r/g Pulmonary/Chest: Clear bilateral lungs.   Abdominal: soft, non-tender, non-distended Skin: warm and dry  Pertinent Labs, Studies, and Procedures:     Latest Ref Rng & Units 02/15/2024    5:52 AM 02/14/2024   11:21 AM 09/24/2023   11:26 AM  CBC  WBC 4.0 - 10.5 K/uL 7.5  6.8  6.7   Hemoglobin 12.0 - 15.0 g/dL 87.1  86.6  87.3   Hematocrit 36.0 - 46.0 % 36.9  39.5  39.5   Platelets 150 - 400 K/uL 327  306  355        Latest Ref Rng & Units 02/18/2024   10:33 AM 02/16/2024    5:52 AM 02/15/2024    5:52 AM  CMP  Glucose 70 - 99 mg/dL 843  656  84   BUN 6 - 20 mg/dL 16  24  32   Creatinine 0.44 - 1.00 mg/dL 8.76  8.66  8.30   Sodium 135 - 145 mmol/L 141  136  141   Potassium 3.5 - 5.1 mmol/L 3.6  4.1  4.2   Chloride 98 - 111 mmol/L 108  105  106   CO2 22 - 32 mmol/L 23  23  25    Calcium  8.9 - 10.3 mg/dL 9.3  9.2  9.7     CT HEAD WO CONTRAST ( ) Result Date: 02/14/2024 CLINICAL DATA:  Weakness x4 days. EXAM: CT HEAD WITHOUT CONTRAST TECHNIQUE: Contiguous axial images were obtained from the base of the skull through the vertex without intravenous contrast. RADIATION DOSE REDUCTION: This exam was performed according to the departmental dose-optimization program which includes automated exposure control, adjustment of the mA and/or kV according to patient size and/or use of iterative reconstruction technique. COMPARISON:  None Available. FINDINGS: Brain: There is generalized cerebral atrophy with widening of the extra-axial spaces and ventricular dilatation. There are areas of decreased attenuation within the white matter tracts of the supratentorial brain, consistent with microvascular disease changes. A chronic right basal ganglia infarct is seen. A 2.7 cm x  2.97 suprasellar mass is noted consistent with the patient's known history of meningioma (axial  CT image 11, CT series 2). This measured 2.8 cm x 2.1 cm on the prior study Vascular: No hyperdense vessel or unexpected calcification. Skull: Normal. Negative for fracture or focal lesion. Sinuses/Orbits: No acute finding. Other: None. IMPRESSION: 1. Generalized cerebral atrophy and microvascular disease changes of the supratentorial brain. 2. Chronic right basal ganglia infarct. 3. 2.7 cm x 2.97 suprasellar mass consistent with the patient's known history of meningioma. Electronically Signed   By: Suzen Dials M.D.   On: 02/14/2024 19:16   CT RENAL STONE STUDY Result Date: 02/14/2024 CLINICAL DATA:  Diffuse abdominal and flank pain EXAM: CT ABDOMEN AND PELVIS WITHOUT CONTRAST TECHNIQUE: Multidetector CT imaging of the abdomen and pelvis was performed following the standard protocol without IV contrast. RADIATION DOSE REDUCTION: This exam was performed according to the departmental dose-optimization program which includes automated exposure control, adjustment of the mA and/or kV according to patient size and/or use of iterative reconstruction technique. COMPARISON:  11/30/2019 FINDINGS: Lower chest: Minimal bilateral lower lobe scarring unchanged. No acute pleural or parenchymal lung disease. Hepatobiliary: Cholecystectomy. Unremarkable unenhanced appearance of the liver. Pancreas: Unremarkable unenhanced appearance. Spleen: Unremarkable unenhanced appearance. Adrenals/Urinary Tract: No urinary tract calculi or obstructive uropathy within either kidney. The adrenals are unremarkable. Bladder is moderately distended without focal abnormality. Stomach/Bowel: No bowel obstruction or ileus. Normal appendix right lower quadrant. No bowel wall thickening or inflammatory change. Vascular/Lymphatic: Aortic atherosclerosis. No enlarged abdominal or pelvic lymph nodes. Reproductive: Status post hysterectomy. No adnexal masses. Other: No free fluid or free intraperitoneal gas. No abdominal wall hernia.  Musculoskeletal: No acute or destructive bony abnormalities. Reconstructed images demonstrate no additional findings. IMPRESSION: 1. Unremarkable unenhanced CT of the abdomen and pelvis. No urinary tract calculi or obstructive uropathy. 2.  Aortic Atherosclerosis (ICD10-I70.0). Electronically Signed   By: Ozell Daring M.D.   On: 02/14/2024 19:14   DG Chest Port 1 View Result Date: 02/14/2024 CLINICAL DATA:  Weakness EXAM: PORTABLE CHEST 1 VIEW COMPARISON:  08/23/2023 FINDINGS: Single frontal view of the chest demonstrates a stable cardiac silhouette. No acute airspace disease, effusion, or pneumothorax. No acute bony abnormalities. IMPRESSION: 1. Stable chest, no acute process. Electronically Signed   By: Ozell Daring M.D.   On: 02/14/2024 13:04     Discharge Instructions: Discharge Instructions     Ambulatory referral to Physical Therapy   Complete by: As directed    Iontophoresis - 4 mg/ml of dexamethasone: No   T.E.N.S. Unit Evaluation and Dispense as Indicated: No   Call MD for:  difficulty breathing, headache or visual disturbances   Complete by: As directed    Call MD for:  persistant dizziness or light-headedness   Complete by: As directed    Diet - low sodium heart healthy   Complete by: As directed    Discharge instructions   Complete by: As directed    - Follow-up with PCP - Follow-up with physical therapy outpatient   Increase activity slowly   Complete by: As directed        Signed: Celestina Czar, MD 02/19/2024, 7:58 PM   Pager: [phone]

## 2024-02-19 NOTE — Telephone Encounter (Signed)
 Patient Product/process development scientist completed.    The patient is insured through [facility].     Ran test claim for Tresiba 200 unit/ml and the current 30 day co-pay is $4.00.  Ran test claim for Toujeo  300 unit/ml and the current 30 day co-pay is $4.00.  This test claim was processed through [facility]- copay amounts may vary at other pharmacies due to pharmacy/plan contracts, or as the patient moves through the different stages of their insurance plan.     Reyes Sharps, CPHT Pharmacy Technician III Certified Patient Advocate [facility] Patient Advocate Team Direct Number: [phone]  Fax: [phone]

## 2024-02-19 NOTE — Progress Notes (Signed)
 Mobility Specialist: Progress Note   02/19/24 1150  Mobility  Activity Ambulated with assistance in hallway  Level of Assistance Standby assist, set-up cues, supervision of patient - no hands on  Assistive Device Front wheel walker  Distance Ambulated (ft) 400 ft  Activity Response Tolerated well  Mobility Referral Yes  Mobility visit 1 Mobility  Mobility Specialist Start Time (ACUTE ONLY) 1050  Mobility Specialist Stop Time (ACUTE ONLY) 1107  Mobility Specialist Time Calculation (min) (ACUTE ONLY) 17 min    Pt received in bed, agreeable to mobility session. Took 1x seated break d/t fatigue and weakness. Ambulated the hallway and around the loop without fault. Left on EOB with all needs met, call bell in reach.   Ileana Lute Mobility Specialist Please contact via SecureChat or Rehab office at [phone]

## 2024-02-19 NOTE — Plan of Care (Signed)

## 2024-02-19 NOTE — TOC Progression Note (Signed)
 Transition of Care [facility]) - Progression Note    Patient Details  Name: Barbara Wang MRN: [id number] Date of Birth: 20-Mar-1966  [facility]) CM/SW Contact  Talitha Dicarlo A [place], LCSW Phone Number: 02/19/2024, 2:35 PM  Clinical Narrative:     CSW notified that pt is medically stable for discharge and wants to go home with home health. CSW was notified that pt's daughter does not want pt to return home as she is not there to supervise 24/7 and pt has been unable to manage her medications and feels she is unsafe at home.   CSW notified provider. Stated that they would speak with family. Provider then updated [facility] team that pt is able to return home with daughter, after discussing a plan with pt and daughter, for an appropriate discharge. RNCM input request for PCS services as well to assist with longer term care at home.   No other needs identified at this time. TOC will sign off, please consult again if TOC needs arise.    Expected Discharge Plan: Skilled Nursing Facility Barriers to Discharge: Continued Medical Work up, English as a second language teacher  Expected Discharge Plan and Services   Discharge Planning Services: CM Consult Post Acute Care Choice: Skilled Nursing Facility Living arrangements for the past 2 months: Apartment                                       Social Determinants of Health (SDOH) Interventions SDOH Screenings   Food Insecurity: Food Insecurity Present (10/09/2022)  Housing: Low Risk  (08/23/2023)  Transportation Needs: No Transportation Needs (08/24/2023)  Utilities: Not At Risk (08/23/2023)  Depression (PHQ2-9): High Risk (09/24/2023)  Financial Resource Strain: Low Risk  (07/31/2023)  Physical Activity: Inactive (07/31/2023)  Social Connections: Unknown (01/06/2022)   Received from [facility]  Stress: No Stress Concern Present (07/31/2023)  Tobacco Use: High Risk (02/14/2024)  Health Literacy: Adequate Health Literacy (07/31/2023)     Readmission Risk Interventions     No data to display

## 2024-02-19 NOTE — Progress Notes (Signed)
 Occupational Therapy Treatment Patient Details Name: Barbara Wang MRN: [id number] DOB: June 03, 1966 Today's Date: 02/19/2024   History of present illness [name] is a 58 year old female who presents with 4 days of progressively worsening generalized weakness. Past medical history significant for uncontrolled insulin -dependent T2DM, hypertension.   OT comments  Patient progressing and showed improved dynamic balance, ambulating to bathroom with SPC and supervision only, compared to previous session where pt required RW and CGA.   Pt may do well with HH PT and OT as opposed to SNF, but pt needs to speak with family about supervision available to ensure pt's safety as she recovers at home.  Patient remains limited by low vision due to meningioma, generalized weakness and decreased activity tolerance along with deficits noted below. Pt continues to demonstrate very good rehab potential and would benefit from continued skilled OT to increase safety and independence with ADLs and functional transfers to allow pt to return home safely and reduce caregiver burden and fall risk.       If plan is discharge home, recommend the following:  Assistance with cooking/housework;A little help with bathing/dressing/bathroom   Equipment Recommendations  None recommended by OT    Recommendations for Other Services      Precautions / Restrictions Precautions Precautions: Fall Recall of Precautions/Restrictions: Intact Restrictions Weight Bearing Restrictions Per Provider Order: No       Mobility Bed Mobility         Supine to sit: Modified independent (Device/Increase time) (HOB at 16 degrees. No use of rail)          Transfers                         Balance Overall balance assessment: Needs assistance   Sitting balance-Leahy Scale: Good     Standing balance support: Single extremity supported, No upper extremity supported Standing balance-Leahy Scale: Fair                              ADL either performed or assessed with clinical judgement   ADL Overall ADL's : Needs assistance/impaired Eating/Feeding: Independent;Sitting Eating/Feeding Details (indicate cue type and reason): Pt completing breakfast sitting EOB. Grooming: Standing;Wash/dry hands;Oral care;Supervision/safety Grooming Details (indicate cue type and reason): CGA once as pt tripped over cane she put to her LT at sink. Pt had planned on returning to bed (to her LT) after grooming. Pt educated that if she knows which direction she is headed to try and place cane on opposite side or shown placing cane right on the vanity to avoid tripping. Rec. use of bright collored tape on cane or anything tripping hazzards at home to increase visability.             Lower Body Dressing: Contact guard assist;Sitting/lateral leans Lower Body Dressing Details (indicate cue type and reason): Donned slide on shoes using OT's foot as barrier to control shoe. Toilet Transfer: Supervision/safety;Ambulation;Comfort height toilet (Pt's own SPC)   Toileting- Clothing Manipulation and Hygiene: Supervision/safety;Sitting/lateral lean;Sit to/from stand       Functional mobility during ADLs: Supervision/safety;Cane      Extremity/Trunk Assessment Upper Extremity Assessment Upper Extremity Assessment: Overall WFL for tasks assessed   Lower Extremity Assessment Lower Extremity Assessment: Overall WFL for tasks assessed        Vision   Vision Assessment?: Vision impaired- to be further tested in functional context (Pt reports that she needs an eye  doctor and needs galsses.) Additional Comments: 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 Noted one sway LT with hallway ambulation and pt self-correcting. Pt also avoiding obstacles to LT but coming close. Ed pt on use of direct lighting at home rather than overhead. Fall prevention at home with reduction of any clutter, use of high  contrast to better see things. Noted after standing at sink pt tripped over her cane that she had placed to her LT. Ed on other places cane could be placed to avoid these accidents.   Perception     Praxis     Communication Communication Communication: No apparent difficulties   Cognition Arousal: Lethargic (more alert once OOB) Behavior During Therapy: WFL for tasks assessed/performed Cognition: No apparent impairments                               Following commands: Intact        Cueing      Exercises Other Exercises Other Exercises: Per pt request, pt performed hqallway ambulation of ~150' with 3 standing rest breaks and use of RW. Supervision only to point out obstacles on pt's LT    Shoulder Instructions       General Comments      Pertinent Vitals/ Pain          Home Living                                          Prior Functioning/Environment              Frequency  Min 2X/week        Progress Toward Goals  OT Goals(current goals can now be found in the care plan section)  Progress towards OT goals: Progressing toward goals  Acute Rehab OT Goals Patient Stated Goal: Doesn't matter...rest. OT Goal Formulation: With patient Time For Goal Achievement: 02/29/24 Potential to Achieve Goals: Good  Plan      Co-evaluation                 AM-PAC OT 6 Clicks Daily Activity     Outcome Measure   Help from another person eating meals?: None Help from another person taking care of personal grooming?: A Little (due to vision and tripping over cane) Help from another person toileting, which includes using toliet, bedpan, or urinal?: A Little Help from another person bathing (including washing, rinsing, drying)?: A Little Help from another person to put on and taking off regular upper body clothing?: A Little Help from another person to put on and taking off regular lower body clothing?: A Little 6 Click Score:  19    End of Session Equipment Utilized During Treatment:  (cane)  OT Visit Diagnosis: History of falling (Z91.81);Unsteadiness on feet (R26.81)   Activity Tolerance Patient tolerated treatment well   Patient Left with call bell/phone within reach;in bed   Nurse Communication Mobility status        Time: 9076-9043 OT Time Calculation (min): 33 min  Charges: OT General Charges $OT Visit: 1 Visit OT Treatments $Self Care/Home Management : 8-22 mins $Therapeutic Activity: 8-22 mins  Delon, OT Acute Rehab Services Office: [phone] 02/19/2024   Delon Falter 02/19/2024, 10:05 AM

## 2024-02-19 NOTE — TOC Progression Note (Addendum)
 Transition of Care [facility]) - Progression Note    Patient Details  Name: Barbara Wang MRN: [id number] Date of Birth: 07-02-1966  Transition of Care [facility]) CM/SW Contact  Barbara JONELLE Joe, RN Phone Number: 02/19/2024, 3:41 PM  Clinical Narrative:    CM spoke with Dr. Hadassah Ala and she states that she spoke with the patient's daughter, Barbara Wang on the phone and daughter was in agreement to take the patient home today.  Daughter explained to the MD that she works during the day away from the home and medication adjustment were being made in the discharge orders so that daughter could provide diabetes medications to the patient in the am before she left for work since patient is unable to provide herself her own medications due to her visual impairment.  Personal Care Services paperwork was completed and faxed to [employer] CM to start the process for personal care services through patient's [facility] insurance provider.  MD is aware that this process may take a few weeks to be in place at the home.  Daughter's contact information was provided on the [facility] Services form for the insurance provider to contact and RN to reach out for first assessment day in the home.  Outpatient PT referral was placed yesterday since home health service companies were unable to offer services due to patient's payor sources and lack of PT staffing in the area.  MD aware.  Patient is aware that she can utilize [employer] transportation for appointments.  No additional DME is needed at this time.  No other TOC needs at this time and patient will be sent home with OP referral for PT.   Expected Discharge Plan: Skilled Nursing Facility Barriers to Discharge: Continued Medical Work up, English as a second language teacher  Expected Discharge Plan and Services   Discharge Planning Services: CM Consult Post Acute Care Choice: Skilled Nursing Facility Living arrangements for the past 2 months:  Apartment Expected Discharge Date: 02/19/24                                     Social Determinants of Health (SDOH) Interventions SDOH Screenings   Food Insecurity: Food Insecurity Present (10/09/2022)  Housing: Low Risk  (08/23/2023)  Transportation Needs: No Transportation Needs (08/24/2023)  Utilities: Not At Risk (08/23/2023)  Depression (PHQ2-9): High Risk (09/24/2023)  Financial Resource Strain: Low Risk  (07/31/2023)  Physical Activity: Inactive (07/31/2023)  Social Connections: Unknown (01/06/2022)   Received from [facility]  Stress: No Stress Concern Present (07/31/2023)  Tobacco Use: High Risk (02/14/2024)  Health Literacy: Adequate Health Literacy (07/31/2023)    Readmission Risk Interventions     No data to display

## 2024-02-19 NOTE — Discharge Instructions (Addendum)
 You were hospitalized for feeling very weak and fatigued for your high blood sugars.  We treated you with IV fluids, and subcutaneous insulin .  Please note these follow-up appointment: [facility] follow-up with your PCP.  Please call to make this appointment.  - Ask them about the personal care services AND the [employer] applications started during this hospitalization  - You will receive the medications below from the [facility]. You will need to tell your primary care provider to send them to the [facility], where you get your medications delivered -Physical therapy outpatient follow-up.  STOP - Insulin  lispro and humalog  kwikpen( Basaglar ) - insulin  glargine U100  START:  - INSULIN  DEGLUDEC (TRESIBA) U200 - 60 units once per day   YOUR DAUGHTER, [name], IS TO PROVIDE ALL MEDICATIONS:   Aspirin  81 mg once a day Crestor 40 mg daily, once a day Trazodone  300 mg daily at bedtime Imodium  1 to 2 tablets/day for diarrhea as needed Ozempic  1 mg mg once per week for diabetes  Please DO NOT take this medicine until follow-up with your PCP - Lisinopril -HCTZ 20/25 mg.  Please make sure to return to the hospital if you have worsening generalized weakness, new fevers, or abdominal pain.  Please call our clinic if you have any questions or concerns, we may be able to help and keep you from a long and expensive emergency room wait. Our clinic and after hours phone number is [phone], the best time to call is Monday through Friday 9 am to 4 pm but there is always someone available 24/7 if you have an emergency. If you need medication refills please notify your pharmacy one week in advance and they will send us  a request.   Missy Sandhoff, M.D.  Hadassah Kristy Ahr, M.D. Internal Medicine Residency at [facility]

## 2024-02-20 ENCOUNTER — Other Ambulatory Visit (HOSPITAL_COMMUNITY): Payer: Self-pay

## 2024-02-20 ENCOUNTER — Telehealth: Payer: Self-pay | Admitting: *Deleted

## 2024-02-20 DIAGNOSIS — Z599 Problem related to housing and economic circumstances, unspecified: Secondary | ICD-10-CM

## 2024-02-20 NOTE — Transitions of Care (Post Inpatient/ED Visit) (Signed)
 02/20/2024  Name: Barbara Wang MRN: [id number] DOB: Sep 18, 1965  Today's TOC FU Call Status: Today's TOC FU Call Status:: Successful TOC FU Call Completed TOC FU Call Complete Date: 02/20/24 (RNCM spoke with DPR/[name]) Patient's Name and Date of Birth confirmed.  Transition Care Management Follow-up Telephone Call Date of Discharge: 02/19/24 Discharge Facility: [facility]) Type of Discharge: Inpatient Admission Primary Inpatient Discharge Diagnosis:: Hyperglycemia How have you been since you were released from the hospital?: Same Any questions or concerns?: No  Items Reviewed: Did you receive and understand the discharge instructions provided?: Yes Medications obtained,verified, and reconciled?: Yes (Medications Reviewed) Any new allergies since your discharge?: No Dietary orders reviewed?: Yes Type of Diet Ordered:: low sodium, heart healthy, carb modified Do you have support at home?: Yes People in Home [RPT]: child(ren), adult Name of Support/Comfort Primary Source: [name]  Medications Reviewed Today: Medications Reviewed Today     Reviewed by Lucky Andrea LABOR, RN (Registered Nurse) on 02/20/24 at [phone]  Med List Status: <None>   Medication Order Taking? Sig Documenting Provider Last Dose Status Informant  Accu-Chek Softclix Lancets lancets [id number] Yes Use to check blood sugar 3 times daily. Newlin, Enobong, MD  Active Self, Pharmacy Records  Accu-Chek Softclix Lancets lancets [id number] Yes Use in the morning, at noon, and at bedtime Elnora Ip, MD  Active   albuterol  (PROAIR  HFA) 108 (90 Base) MCG/ACT inhaler [id number] Yes Inhale 1-2 puffs into the lungs every 6 (six) hours as needed for wheezing or shortness of breath. Theotis Haze ORN, NP  Active Self, Pharmacy Records  albuterol  (PROVENTIL ) (2.5 MG/3ML) 0.083% nebulizer solution [id number] Yes Take 3 mLs (2.5 mg total) by nebulization every 6 (six) hours as needed for wheezing or shortness of  breath. Theotis Haze ORN, NP  Active Self, Pharmacy Records  aspirin  EC 81 MG tablet [id number] Yes Take 1 tablet (81 mg total) by mouth daily. Theotis Haze ORN, NP  Active Self, Pharmacy Records  atorvastatin  (LIPITOR) 40 MG tablet [id number] Yes Take 1 tablet (40 mg total) by mouth daily at 6 PM. Fleming, Zelda W, NP  Active Self, Pharmacy Records  Blood Glucose Monitoring Suppl (ACCU-CHEK GUIDE ME) w/Device KIT [id number] Yes Use to check blood glucose in the morning, at noon, and at bedtime. Gomez-Caraballo, Maria, MD  Active   Blood Glucose Monitoring Suppl (ACCU-CHEK GUIDE) w/Device KIT [id number] Yes Use to check blood sugar 3 times daily. Newlin, Enobong, MD  Active Self, Pharmacy Records  budesonide -formoterol  (SYMBICORT ) 160-4.5 MCG/ACT inhaler [id number] Yes Inhale 2 puffs into the lungs 2 (two) times daily. Theotis Haze ORN, NP  Active Self, Pharmacy Records  Continuous Glucose Receiver (FREESTYLE LIBRE 3 READER) DEVI [id number] Yes Use to check blood sugars continuously daily. Elnora Ip, MD  Active   Continuous Glucose Sensor (FREESTYLE LIBRE 3 SENSOR) [place] [id number] Yes Place 1 sensor on the skin every 15 days. Use to check glucose continuously Elnora Ip, MD  Active   fluticasone -salmeterol (ADVAIR  HFA) 230-21 MCG/ACT inhaler [id number] Yes Inhale 2 puffs into the lungs 2 (two) times daily. Theotis Haze ORN, NP  Active Self, Pharmacy Records  gabapentin  (NEURONTIN ) 600 MG tablet [id number] Yes Take 1 tablet (600 mg total) by mouth daily as needed (for neuropathy in hands and feet). Theotis Haze ORN, NP  Active Self, Pharmacy Records           Med Note JACKOLYN WADDELL VEAR   Fri Feb 15, 2024  8:28 AM)    glucose blood (ACCU-CHEK GUIDE TEST) test  strip [id number] Yes Use to check blood sugar 3 times daily. Newlin, Enobong, MD  Active Self, Pharmacy Records  Glucose Blood (BLOOD GLUCOSE TEST STRIPS) STRP [id number] Yes Use to check blood glucose in the morning, at noon, and at  bedtime. Gomez-Caraballo, Maria, MD  Active   insulin  degludec (TRESIBA) 200 UNIT/ML FlexTouch Pen [id number]  Inject 60 Units into the skin daily.  Patient not taking: Reported on 02/20/2024   Elnora Ip, MD  Active      Discontinued 10/06/21 [phone] (External Source Cancellation)   Insulin  Pen Needle 32G X 4 MM MISC [id number]  Use with insulin , 3 (three) times daily.  Patient not taking: Reported on 02/20/2024   Elnora Ip, MD  Active   Lancet Device MISC [id number]  1 each by Does not apply route in the morning, at noon, and at bedtime. May substitute to any manufacturer covered by patient's insurance.  Patient not taking: Reported on 02/20/2024   Gomez-Caraballo, Maria, MD  Active   lisinopril -hydrochlorothiazide  (ZESTORETIC ) 20-25 MG tablet [id number]  Take 1 tablet by mouth daily.  Patient not taking: Reported on 02/20/2024   Theotis Haze ORN, NP  Active Self, Pharmacy Records  loperamide  (IMODIUM  A-D) 2 MG tablet [id number] Yes Take 2 mg by mouth 4 (four) times daily as needed for diarrhea or loose stools. [provider]  Active Self, Pharmacy Records  mometasone  (ELOCON ) 0.1 % cream [id number]  Apply topically daily. Theotis Haze ORN, NP  Active Self, Pharmacy Records  omega-3 acid ethyl esters (LOVAZA ) 1 g capsule [id number]  Take 1 capsule (1 g total) by mouth daily. Theotis Haze ORN, NP  Active Self, Pharmacy Records  pantoprazole  (PROTONIX ) 40 MG tablet [id number]  Take 1 tablet (40 mg total) by mouth daily. Theotis Haze ORN, NP  Active Self, Pharmacy Records  potassium chloride  SA (KLOR-CON  M) 20 MEQ tablet [id number]  Take 1 tablet (20 mEq total) by mouth 2 (two) times daily.  Patient not taking: Reported on 02/20/2024   Theotis Haze ORN, NP  Active Self, Pharmacy Records  Semaglutide , 1 MG/DOSE, 4 MG/3ML SOPN [id number] Yes Inject 1 mg into the skin once a week. Theotis Haze ORN, NP  Active Self, Pharmacy Records           Med Note JACKOLYN WADDELL DEL   Fri Feb 15, 2024  8:30 AM) Thursday's.   sertraline  (ZOLOFT ) 100 MG tablet [id number] Yes Take 1 tablet (100 mg total) by mouth daily FOR DEPRESSION. Theotis Haze ORN, NP  Active Self, Pharmacy Records  trazodone  (DESYREL ) 300 MG tablet [id number] Yes Take 1 tablet (300 mg total) by mouth at bedtime. Theotis Haze ORN, NP  Active Self, Pharmacy Records            Home Care and Equipment/Supplies: Were Home Health Services Ordered?: No Any new equipment or medical supplies ordered?: Yes Name of Medical supply agency?: [employer] Were you able to get the equipment/medical supplies?: Yes Do you have any questions related to the use of the equipment/supplies?: No  Functional Questionnaire: Do you need assistance with bathing/showering or dressing?: Yes Do you need assistance with meal preparation?: No Do you need assistance with eating?: No Do you have difficulty maintaining continence: Yes Do you need assistance with getting out of bed/getting out of a chair/moving?: Yes Do you have difficulty managing or taking your medications?: Yes  Follow up appointments reviewed: PCP Follow-up appointment confirmed?: No (DPR will call to schedule a hospital follow up appointment) MD Provider Line Number:[phone]  Given: No Specialist Hospital Follow-up appointment confirmed?: No (Referred to Outpatient Rehab [phone]) Reason Specialist Follow-Up Not Confirmed: Patient has Specialist Provider Number and will Call for Appointment Do you need transportation to your follow-up appointment?: Yes Transportation Need Intervention Addressed By:: Other: (DPR is aware of medical transportation provided by [facility] and will utilize this service) Do you understand care options if your condition(s) worsen?: Yes-patient verbalized understanding  SDOH Interventions Today    Flowsheet Row Most Recent Value  SDOH Interventions   Food Insecurity Interventions Intervention Not Indicated  Housing Interventions  Intervention Not Indicated  Transportation Interventions Payor Benefit  Utilities Interventions Other (Comment)  [Referral to VBCI SW]    Goals Addressed             This Visit's Progress    [facility]BCI Transitions of Care (TOC) Care Plan       Problems:  Recent Hospitalization for treatment of DMII Knowledge Deficit Related to Diabetes Management and SDOH barrier: Utilities  Goal:  Over the next 30 days, the patient will not experience hospital readmission  Interventions:  Transitions of Care: Annual Eye Exam advised patient to contact provider to schedule exam Doctor Visits  - discussed the importance of doctor visits Post discharge activity limitations prescribed by provider reviewed Reviewed discharge instructions and discussed with DPR Advised calling to scheduled Hospital follow up with PCP and Physical Therapy at Outpatient office-DPR has contact information and will call to schedule Discussed the importance of a carbohydrate modified diet, examples provided Discussed the application process for PCS was initiated in the hospital, [employer] should be contacting to schedule an evaluation Medication review, advised DPR to compare the list of medications on discharge list with what patient has at home to ensure accuracy in home medications, discussed medications on HOLD Referral to VBCI SW for resources to assist with utilities  Patient Self Care Activities:  Attend all scheduled provider appointments Call provider office for new concerns or questions  Participate in Transition of Care Program/Attend TOC scheduled calls Take medications as prescribed   Work with the social worker to address care coordination needs and will continue to work with the clinical team to address health care and disease management related needs schedule appointment with eye doctor drink 6 to 8 glasses of water each day fill half of plate with vegetables manage portion size switch to sugar-free  drinks  Plan:  Telephone follow up appointment with care management team member scheduled for:  02/27/24 at 11am        Andrea Dimes RN, BSN [place]  [facility] RN Care Manager [phone]

## 2024-02-27 ENCOUNTER — Encounter: Payer: Self-pay | Admitting: *Deleted

## 2024-02-27 ENCOUNTER — Telehealth: Payer: Self-pay | Admitting: *Deleted

## 2024-02-28 ENCOUNTER — Encounter: Payer: Self-pay | Admitting: *Deleted

## 2024-02-28 ENCOUNTER — Telehealth: Payer: Self-pay

## 2024-02-28 NOTE — Progress Notes (Signed)
 Complex Care Management Note Care Guide Note  02/28/2024 Name: Barbara Wang MRN: [id number] DOB: 11-29-1965   Complex Care Management Outreach Attempts: An unsuccessful telephone outreach was attempted today to offer the patient information about available complex care management services.  Follow Up Plan:  Additional outreach attempts will be made to offer the patient complex care management information and services.   Encounter Outcome:  No Answer  Boyde Grieco [facility]  [facility] Guide Direct Dial : [phone]  Fax: [phone] Website: [email or web address]

## 2024-03-03 ENCOUNTER — Encounter: Payer: Self-pay | Admitting: *Deleted

## 2024-03-04 ENCOUNTER — Telehealth: Payer: Self-pay

## 2024-03-04 NOTE — Progress Notes (Signed)
 Complex Care Management Note Care Guide Note  03/04/2024 Name: Barbara Wang MRN: [id number] DOB: 1966-04-24   Complex Care Management Outreach Attempts: A second unsuccessful outreach was attempted today to offer the patient with information about available complex care management services.  Follow Up Plan:  Additional outreach attempts will be made to offer the patient complex care management information and services.   Encounter Outcome:  No Answer  Tiyonna Sardinha [facility]  [facility] Resource Care Guide Direct Dial : [phone]  Fax: [phone] Website: [email or web address]

## 2024-03-05 ENCOUNTER — Telehealth: Payer: Self-pay

## 2024-03-05 NOTE — Progress Notes (Signed)
 Complex Care Management Note Care Guide Note  03/05/2024 Name: Barbara Wang MRN: [id number] DOB: 11-01-1965   Complex Care Management Outreach Attempts: A third unsuccessful outreach was attempted today to offer the patient with information about available complex care management services.  Follow Up Plan:  No further outreach attempts will be made at this time. We have been unable to contact the patient to offer or enroll patient in complex care management services.  Encounter Outcome:  No Answer  Takyla Kuchera [facility]  [facility] Resource Care Guide Direct Dial : [phone]  Fax: [phone] Website: [email or web address]

## 2024-03-18 ENCOUNTER — Telehealth: Payer: Self-pay | Admitting: Nurse Practitioner

## 2024-03-18 MED ORDER — MISC. DEVICES MISC: 99 refills | Status: AC

## 2024-03-18 NOTE — Telephone Encounter (Signed)
 Copied from CRM [id number]. Topic: Clinical - Order For Equipment >> Mar 18, 2024  3:50 PM Tobias CROME wrote:  Reason for CRM: Allean with [employer] following up on request for incontinence supplies. Request was faxed on 03/12/24 to office.   Krisin will refax today, confirmed faxed number

## 2024-03-18 NOTE — Telephone Encounter (Signed)
 Prescription in chart. May send notes from 08-2023

## 2024-03-18 NOTE — Addendum Note (Signed)
 Addended by: Willene Holian on: 03/18/2024 10:04 PM   Modules accepted: Orders

## 2024-03-19 NOTE — Telephone Encounter (Signed)
 Order and notes will be faxed once signed.

## 2024-03-26 NOTE — Telephone Encounter (Signed)
 Fax sent multiple times. Will fax again today.

## 2024-03-26 NOTE — Telephone Encounter (Signed)
>>   Mar 26, 2024  2:32 PM Delon DASEN wrote:  Garrel with [employer] calling to follow up on fax sent to renew prescription for incontinence supplies

## 2024-03-27 ENCOUNTER — Telehealth (HOSPITAL_COMMUNITY): Payer: Self-pay

## 2024-03-27 ENCOUNTER — Other Ambulatory Visit (HOSPITAL_COMMUNITY): Payer: Self-pay

## 2024-03-27 ENCOUNTER — Other Ambulatory Visit: Payer: Self-pay

## 2024-03-27 ENCOUNTER — Other Ambulatory Visit: Payer: Self-pay | Admitting: Nurse Practitioner

## 2024-03-27 MED ORDER — TRESIBA FLEXTOUCH 200 UNIT/ML ~~LOC~~ SOPN
60.0000 [IU] | PEN_INJECTOR | Freq: Every day | SUBCUTANEOUS | 0 refills | Status: DC
  Filled 2024-03-27: qty 9, 30d supply, fill #0

## 2024-03-27 NOTE — Telephone Encounter (Signed)
 PA request has been Received. New Encounter has been or will be created for follow up. For additional info see Pharmacy Prior Auth telephone encounter from 03/27/24.

## 2024-04-01 NOTE — Telephone Encounter (Unsigned)
 Copied from CRM [id number]. Topic: General - Other >> Apr 01, 2024  2:21 PM Tiffany S wrote: Reason for CRM: following up on paperwork for supplies 7/18/6/30 [employer] still have not received paperwork back   Fax [phone]

## 2024-04-01 NOTE — Telephone Encounter (Signed)
 Forms have been faxed 04/01/24.

## 2024-04-03 ENCOUNTER — Telehealth: Payer: Self-pay | Admitting: Nurse Practitioner

## 2024-04-03 NOTE — Telephone Encounter (Signed)
 Paperwork has been signed and faxed.

## 2024-04-03 NOTE — Telephone Encounter (Signed)
 Copied from CRM [id number]. Topic: General - Other >> Apr 01, 2024  2:21 PM Tiffany S wrote: Reason for CRM: following up on paperwork for supplies 7/18/6/30 [employer] still have not received paperwork back   Fax [phone] >> Apr 03, 2024  9:19 AM Montie POUR wrote: [employer] has not received order for her pull ups and gloves. She is almost out of pull ups. Please fax order to number above

## 2024-04-30 ENCOUNTER — Other Ambulatory Visit: Payer: Self-pay

## 2024-04-30 ENCOUNTER — Other Ambulatory Visit: Payer: Self-pay | Admitting: Nurse Practitioner

## 2024-05-01 ENCOUNTER — Other Ambulatory Visit: Payer: Self-pay

## 2024-05-01 ENCOUNTER — Encounter: Payer: Self-pay | Admitting: Pharmacist

## 2024-05-01 NOTE — Telephone Encounter (Signed)
 Courtesy refill given 03/27/24, appointment needed.  Requested Prescriptions  Pending Prescriptions Disp Refills   insulin  degludec (TRESIBA  FLEXTOUCH) 200 UNIT/ML FlexTouch Pen 9 mL 0    Sig: Inject 60 Units into the skin daily.     Endocrinology:  Diabetes - Insulins Failed - 05/01/2024 10:15 AM      Failed - HBA1C is between 0 and 7.9 and within 180 days    Hgb A1c MFr Bld  Date Value Ref Range Status  02/14/2024 >15.5 (H) 4.8 - 5.6 % Final    Comment:    (NOTE)         Prediabetes: 5.7 - 6.4         Diabetes: >6.4         Glycemic control for adults with diabetes: <7.0          Failed - Valid encounter within last 6 months    Recent Outpatient Visits           7 months ago Low TSH level   [facility] [place]. [facility] Theotis Haze ORN, NP   9 months ago Primary hypertension   [facility] [place]. [facility] Theotis Haze ORN, NP   1 year ago Uncontrolled type 2 diabetes mellitus with hyperglycemia [facility])   [facility] [place]. [facility] Theotis Haze ORN, NP   1 year ago Primary hypertension   [facility] [place]. [facility] Theotis Haze ORN, NP   1 year ago Hospital discharge follow-up   [facility], [place], [place]

## 2024-05-19 ENCOUNTER — Other Ambulatory Visit (HOSPITAL_COMMUNITY): Payer: Self-pay

## 2024-05-19 ENCOUNTER — Other Ambulatory Visit: Payer: Self-pay

## 2024-05-19 ENCOUNTER — Other Ambulatory Visit: Payer: Self-pay | Admitting: Nurse Practitioner

## 2024-05-20 NOTE — Telephone Encounter (Signed)
 Requested medications are due for refill today.  yes  Requested medications are on the active medications list.  yes  Last refill. 03/27/2024 9mL 0 rf  Future visit scheduled.   no  Notes to clinic.  Abnormal labs.    Requested Prescriptions  Pending Prescriptions Disp Refills   insulin  degludec (TRESIBA  FLEXTOUCH) 200 UNIT/ML FlexTouch Pen 9 mL 0    Sig: Inject 60 Units into the skin daily.     Endocrinology:  Diabetes - Insulins Failed - 05/20/2024  1:44 PM      Failed - HBA1C is between 0 and 7.9 and within 180 days    Hgb A1c MFr Bld  Date Value Ref Range Status  02/14/2024 >15.5 (H) 4.8 - 5.6 % Final    Comment:    (NOTE)         Prediabetes: 5.7 - 6.4         Diabetes: >6.4         Glycemic control for adults with diabetes: <7.0          Failed - Valid encounter within last 6 months    Recent Outpatient Visits           7 months ago Low TSH level   [facility] [place]. [facility] Theotis Haze ORN, NP   9 months ago Primary hypertension   [facility] [place]. [facility] Theotis Haze ORN, NP   1 year ago Uncontrolled type 2 diabetes mellitus with hyperglycemia [facility])   [facility] [place]. [facility] Theotis Haze ORN, NP   1 year ago Primary hypertension   [facility] [place]. [facility] Theotis Haze ORN, NP   1 year ago Hospital discharge follow-up   [facility] [place], NP

## 2024-05-22 ENCOUNTER — Other Ambulatory Visit (HOSPITAL_COMMUNITY): Payer: Self-pay

## 2024-05-22 ENCOUNTER — Encounter (HOSPITAL_COMMUNITY): Payer: Self-pay

## 2024-05-28 ENCOUNTER — Telehealth: Payer: Self-pay

## 2024-05-28 ENCOUNTER — Other Ambulatory Visit: Payer: Self-pay

## 2024-05-28 NOTE — Telephone Encounter (Signed)
 Copied from CRM #[id number]. Topic: Clinical - Medication Question >> May 28, 2024  9:49 AM Travis F wrote: Reason for CRM: Patient is calling in because she scheduled an appointment for 06/18/24 but wanted to know if she could get a refill on her [place] prior to her appointment.

## 2024-05-29 ENCOUNTER — Other Ambulatory Visit (HOSPITAL_COMMUNITY): Payer: Self-pay

## 2024-05-29 ENCOUNTER — Other Ambulatory Visit: Payer: Self-pay | Admitting: Nurse Practitioner

## 2024-05-29 ENCOUNTER — Other Ambulatory Visit: Payer: Self-pay

## 2024-05-29 MED ORDER — TRESIBA FLEXTOUCH 200 UNIT/ML ~~LOC~~ SOPN
60.0000 [IU] | PEN_INJECTOR | Freq: Every day | SUBCUTANEOUS | 0 refills | Status: DC
  Filled 2024-05-29 – 2024-07-25 (×3): qty 9, 30d supply, fill #0

## 2024-05-29 NOTE — Telephone Encounter (Signed)
Unable to reach patient by phone. Unable to leave voicemail.

## 2024-05-29 NOTE — Telephone Encounter (Signed)
 Sent!

## 2024-06-18 ENCOUNTER — Ambulatory Visit: Admitting: Nurse Practitioner

## 2024-07-07 ENCOUNTER — Ambulatory Visit: Payer: Self-pay

## 2024-07-07 NOTE — Telephone Encounter (Signed)
 FYI Only or Action Required?: Action required by provider: request for appointment. Unable to make an appointment due to availability on decision tree.   Patient was last seen in primary care on 09/24/2023 by Theotis Haze ORN, NP.  Called Nurse Triage reporting Abdominal Pain and Diarrhea.  Symptoms began several months ago.  Interventions attempted: Rest, hydration, or home remedies.  Symptoms are: unchanged.  Triage Disposition: See Physician Within 24 Hours  Patient/caregiver understands and will follow disposition?: No, wishes to speak with PCP  Patient reports continued abdominal pain and diarrhea. Patient states both the pain and diarrhea comes and go. Patient is wanting to be seen in the office and to get a refill on her Tresiba .   Copied from CRM [id number]. Topic: Clinical - Red Word Triage >> Jul 07, 2024 12:11 PM Barbara Wang wrote: Red Word that prompted transfer to Nurse Triage: Abdominal pain and diarrhea   ----------------------------------------------------------------------- From previous Reason for Contact - Scheduling: Patient/patient representative is calling to schedule an appointment. Refer to attachments for appointment information. Reason for Disposition  [1] SEVERE diarrhea (e.g., 7 or more times / day more than normal) AND [2] present > 24 hours (1 day)  [1] MODERATE pain (e.g., interferes with normal activities) AND [2] pain comes and goes (cramps) AND [3] present > 24 hours  (Exception: Pain with Vomiting or Diarrhea - see that Guideline.)  Answer Assessment - Initial Assessment Questions 1. LOCATION: Where does it hurt?      Lower abdominal pain 2. RADIATION: Does the pain shoot anywhere else? (e.g., chest, back)     Radiating from side to side 3. ONSET: When did the pain begin? (e.g., minutes, hours or days ago)      Several months 4. SUDDEN: Gradual or sudden onset?     Will come on patient sudden 5. PATTERN Does the pain come and go, or is  it constant?     Comes and goes 6. SEVERITY: How bad is the pain?  (e.g., Scale 1-10; mild, moderate, or severe)     4 out of 10 7. RECURRENT SYMPTOM: Have you ever had this type of stomach pain before? If Yes, ask: When was the last time? and What happened that time?      yes 8. CAUSE: What do you think is causing the stomach pain? (e.g., gallstones, recent abdominal surgery)     unsure 9. RELIEVING/AGGRAVATING FACTORS: What makes it better or worse? (e.g., antacids, bending or twisting motion, bowel movement)     Will take imodium  which slows down the diarrhea 10. OTHER SYMPTOMS: Do you have any other symptoms? (e.g., back pain, diarrhea, fever, urination pain, vomiting)       diarrhea  Answer Assessment - Initial Assessment Questions 1. DIARRHEA SEVERITY: How bad is the diarrhea? How many more stools have you had in the past 24 hours than normal?      Multiple times to the bathroom with diarrhea 2. ONSET: When did the diarrhea begin?      Started after having gallbladder removed 3. STOOL DESCRIPTION:  How loose or watery is the diarrhea? What is the stool color? Is there any blood or mucous in the stool?     Quite loose and watery.  4. VOMITING: Are you also vomiting? If Yes, ask: How many times in the past 24 hours?      no 5. ABDOMEN PAIN: Are you having any abdomen pain? If Yes, ask: What does it feel like? (e.g., crampy, dull, intermittent, constant)  yes 6. ABDOMEN PAIN SEVERITY: If present, ask: How bad is the pain?  (e.g., Scale 1-10; mild, moderate, or severe)     4 out of 10 7. ORAL INTAKE: If vomiting, Have you been able to drink liquids? How much liquids have you had in the past 24 hours?     Drinking liquids 8. HYDRATION: Any signs of dehydration? (e.g., dry mouth [not just dry lips], too weak to stand, dizziness, new weight loss) When did you last urinate?     Patient doesn't think she has any signs of dehydration 9.  EXPOSURE: Have you traveled to a foreign country recently? Have you been exposed to anyone with diarrhea? Could you have eaten any food that was spoiled?     no 10. ANTIBIOTIC USE: Are you taking antibiotics now or have you taken antibiotics in the past 2 months?       no 11. OTHER SYMPTOMS: Do you have any other symptoms? (e.g., fever, blood in stool)       no  Protocols used: Abdominal Pain - Female-A-AH, Diarrhea-A-AH

## 2024-07-07 NOTE — Telephone Encounter (Signed)
 Okay, thank you!

## 2024-07-25 ENCOUNTER — Other Ambulatory Visit: Payer: Self-pay | Admitting: Nurse Practitioner

## 2024-07-25 ENCOUNTER — Other Ambulatory Visit: Payer: Self-pay | Admitting: Student

## 2024-07-25 ENCOUNTER — Other Ambulatory Visit: Payer: Self-pay

## 2024-07-25 DIAGNOSIS — E119 Type 2 diabetes mellitus without complications: Secondary | ICD-10-CM

## 2024-07-28 NOTE — Telephone Encounter (Signed)
 Not imc patient

## 2024-08-11 ENCOUNTER — Other Ambulatory Visit: Payer: Self-pay

## 2024-08-12 ENCOUNTER — Encounter: Admitting: Family

## 2024-08-12 NOTE — Progress Notes (Signed)
 Erroneous encounter-disregard

## 2024-08-15 ENCOUNTER — Telehealth (HOSPITAL_COMMUNITY): Payer: Self-pay

## 2024-08-15 ENCOUNTER — Other Ambulatory Visit: Payer: Self-pay

## 2024-08-15 ENCOUNTER — Other Ambulatory Visit: Payer: Self-pay | Admitting: Nurse Practitioner

## 2024-08-15 DIAGNOSIS — E1142 Type 2 diabetes mellitus with diabetic polyneuropathy: Secondary | ICD-10-CM

## 2024-08-18 ENCOUNTER — Telehealth (HOSPITAL_COMMUNITY): Payer: Self-pay

## 2024-08-18 ENCOUNTER — Other Ambulatory Visit (HOSPITAL_COMMUNITY): Payer: Self-pay

## 2024-08-18 NOTE — Telephone Encounter (Signed)
 The Freestyle Libre 14 day, 2 and 3 are being discontinued in September 2025. The new version is 2 plus and 3 plus and are available. We are highly encouraging providers to go ahead and make the switch now. That will prevent us  from doing the prior auth on the old version now and then having to do a prior auth on the new version again in a few months. Thanks!

## 2024-08-18 NOTE — Telephone Encounter (Signed)
 PA request has been Received. New Encounter has been or will be created for follow up. For additional info see Pharmacy Prior Auth telephone encounter from 08/18/24.

## 2024-08-26 ENCOUNTER — Other Ambulatory Visit: Payer: Self-pay

## 2024-08-26 ENCOUNTER — Other Ambulatory Visit (HOSPITAL_COMMUNITY): Payer: Self-pay

## 2024-08-26 ENCOUNTER — Other Ambulatory Visit: Payer: Self-pay | Admitting: Nurse Practitioner

## 2024-08-26 NOTE — Telephone Encounter (Signed)
 Copied from CRM #[id number]. Topic: Clinical - Medication Refill >> Aug 26, 2024 10:53 AM Olam RAMAN wrote: Medication:   insulin  degludec (TRESIBA  FLEXTOUCH) 200 UNIT/ML FlexTouch Pen Continuous Glucose Sensor (FREESTYLE LIBRE 3 SENSOR) MISC  Has the patient contacted their pharmacy? Yes (Agent: If no, request that the patient contact the pharmacy for the refill. If patient does not wish to contact the pharmacy document the reason why and proceed with request.) (Agent: If yes, when and what did the pharmacy advise?)  This is the patient's preferred pharmacy:  [facility] [address], [address] [place] [address] Phone: [phone] Fax: [phone]  [facility] [address] [place] - [address] [facility] [place] [address] Phone: [phone] Fax: [phone]  [id number] [place], [place] - [address] AT [facility] [address] [place] [address] Phone: [phone] Fax: [phone]  [facility] [address] [place] [address] Phone: [phone] Fax: [phone]  Is this the correct pharmacy for this prescription? Yes If no, delete pharmacy and type the correct one.   Has the prescription been filled recently? Yes  Is the patient out of the medication? Yes  Has the patient been seen for an appointment in the last year OR does the patient have an upcoming appointment? No  Can we respond through MyChart? Yes  Agent: Please be advised that Rx refills may take up to 3 business days. We ask that you follow-up with your pharmacy.

## 2024-09-04 ENCOUNTER — Other Ambulatory Visit: Payer: Self-pay

## 2024-09-12 ENCOUNTER — Other Ambulatory Visit: Payer: Self-pay

## 2024-09-18 ENCOUNTER — Observation Stay
Admission: EM | Admit: 2024-09-18 | Discharge: 2024-09-20 | Disposition: A | Discharge status: Home / Self Care | Attending: Internal Medicine | Admitting: Internal Medicine

## 2024-09-18 DIAGNOSIS — J418 Mixed simple and mucopurulent chronic bronchitis: Secondary | ICD-10-CM

## 2024-09-18 DIAGNOSIS — F32A Depression, unspecified: Secondary | ICD-10-CM | POA: Insufficient documentation

## 2024-09-18 DIAGNOSIS — G629 Polyneuropathy, unspecified: Secondary | ICD-10-CM | POA: Insufficient documentation

## 2024-09-18 DIAGNOSIS — E785 Hyperlipidemia, unspecified: Secondary | ICD-10-CM | POA: Insufficient documentation

## 2024-09-18 DIAGNOSIS — N390 Urinary tract infection, site not specified: Secondary | ICD-10-CM | POA: Insufficient documentation

## 2024-09-18 DIAGNOSIS — Z794 Long term (current) use of insulin: Secondary | ICD-10-CM | POA: Insufficient documentation

## 2024-09-18 DIAGNOSIS — E0810 Diabetes mellitus due to underlying condition with ketoacidosis without coma: Secondary | ICD-10-CM

## 2024-09-18 DIAGNOSIS — F419 Anxiety disorder, unspecified: Secondary | ICD-10-CM | POA: Insufficient documentation

## 2024-09-18 DIAGNOSIS — N179 Acute kidney failure, unspecified: Secondary | ICD-10-CM | POA: Insufficient documentation

## 2024-09-18 DIAGNOSIS — E1165 Type 2 diabetes mellitus with hyperglycemia: Principal | ICD-10-CM | POA: Insufficient documentation

## 2024-09-18 DIAGNOSIS — R739 Hyperglycemia, unspecified: Principal | ICD-10-CM

## 2024-09-18 DIAGNOSIS — J45909 Unspecified asthma, uncomplicated: Secondary | ICD-10-CM | POA: Insufficient documentation

## 2024-09-19 ENCOUNTER — Encounter: Payer: Self-pay | Admitting: Family Medicine

## 2024-09-19 ENCOUNTER — Other Ambulatory Visit: Payer: Self-pay

## 2024-09-19 DIAGNOSIS — N390 Urinary tract infection, site not specified: Secondary | ICD-10-CM

## 2024-09-19 DIAGNOSIS — G629 Polyneuropathy, unspecified: Secondary | ICD-10-CM

## 2024-09-19 DIAGNOSIS — E785 Hyperlipidemia, unspecified: Secondary | ICD-10-CM | POA: Diagnosis not present

## 2024-09-19 DIAGNOSIS — N179 Acute kidney failure, unspecified: Secondary | ICD-10-CM | POA: Diagnosis not present

## 2024-09-19 DIAGNOSIS — E1165 Type 2 diabetes mellitus with hyperglycemia: Secondary | ICD-10-CM | POA: Diagnosis not present

## 2024-09-19 DIAGNOSIS — F32A Depression, unspecified: Secondary | ICD-10-CM | POA: Insufficient documentation

## 2024-09-19 DIAGNOSIS — J4520 Mild intermittent asthma, uncomplicated: Secondary | ICD-10-CM

## 2024-09-19 DIAGNOSIS — J45909 Unspecified asthma, uncomplicated: Secondary | ICD-10-CM | POA: Insufficient documentation

## 2024-09-19 LAB — CBC WITH DIFFERENTIAL/PLATELET
Abs Immature Granulocytes: 0.02 K/uL (ref 0.00–0.07)
Basophils Absolute: 0.1 K/uL (ref 0.0–0.1)
Basophils Relative: 1 %
Eosinophils Absolute: 0.1 K/uL (ref 0.0–0.5)
Eosinophils Relative: 2 %
HCT: 38.5 % (ref 36.0–46.0)
Hemoglobin: 13.2 g/dL (ref 12.0–15.0)
Immature Granulocytes: 0 %
Lymphocytes Relative: 26 %
Lymphs Abs: 1.7 K/uL (ref 0.7–4.0)
MCH: 29.1 pg (ref 26.0–34.0)
MCHC: 34.3 g/dL (ref 30.0–36.0)
MCV: 84.8 fL (ref 80.0–100.0)
Monocytes Absolute: 0.5 K/uL (ref 0.1–1.0)
Monocytes Relative: 8 %
Neutro Abs: 4.2 K/uL (ref 1.7–7.7)
Neutrophils Relative %: 63 %
Platelets: 323 K/uL (ref 150–400)
RBC: 4.54 MIL/uL (ref 3.87–5.11)
RDW: 12.6 % (ref 11.5–15.5)
WBC: 6.6 K/uL (ref 4.0–10.5)
nRBC: 0 % (ref 0.0–0.2)

## 2024-09-19 LAB — BASIC METABOLIC PANEL WITH GFR
Anion gap: 13 (ref 5–15)
BUN: 16 mg/dL (ref 6–20)
CO2: 27 mmol/L (ref 22–32)
Calcium: 9.9 mg/dL (ref 8.9–10.3)
Chloride: 103 mmol/L (ref 98–111)
Creatinine, Ser: 1.13 mg/dL — ABNORMAL HIGH (ref 0.44–1.00)
GFR, Estimated: 55 mL/min — ABNORMAL LOW
Glucose, Bld: 342 mg/dL — ABNORMAL HIGH (ref 70–99)
Potassium: 3.2 mmol/L — ABNORMAL LOW (ref 3.5–5.1)
Sodium: 142 mmol/L (ref 135–145)

## 2024-09-19 LAB — GLUCOSE, CAPILLARY
Glucose-Capillary: 418 mg/dL — ABNORMAL HIGH (ref 70–99)
Glucose-Capillary: 121 mg/dL — ABNORMAL HIGH (ref 70–99)
Glucose-Capillary: 108 mg/dL — ABNORMAL HIGH (ref 70–99)
Glucose-Capillary: 88 mg/dL (ref 70–99)
Glucose-Capillary: 194 mg/dL — ABNORMAL HIGH (ref 70–99)

## 2024-09-19 LAB — BLOOD GAS, VENOUS

## 2024-09-19 LAB — CBC
HCT: 39.1 % (ref 36.0–46.0)
Hemoglobin: 13.3 g/dL (ref 12.0–15.0)
MCH: 28.8 pg (ref 26.0–34.0)
MCHC: 34 g/dL (ref 30.0–36.0)
MCV: 84.6 fL (ref 80.0–100.0)
Platelets: 327 K/uL (ref 150–400)
RBC: 4.62 MIL/uL (ref 3.87–5.11)
RDW: 12.7 % (ref 11.5–15.5)
WBC: 7.8 K/uL (ref 4.0–10.5)
nRBC: 0 % (ref 0.0–0.2)

## 2024-09-19 LAB — URINALYSIS, W/ REFLEX TO CULTURE (INFECTION SUSPECTED)
Bilirubin Urine: NEGATIVE
Glucose, UA: >=500 mg/dL — AB
Ketones, ur: NEGATIVE mg/dL
Nitrite: NEGATIVE
Protein, ur: 100 mg/dL — AB
Specific Gravity, Urine: 1.025 (ref 1.005–1.030)
WBC, UA: >50 WBC/hpf (ref 0–5)
pH: 6 (ref 5.0–8.0)

## 2024-09-19 LAB — CBG MONITORING, ED
Glucose-Capillary: 469 mg/dL — ABNORMAL HIGH (ref 70–99)
Glucose-Capillary: 486 mg/dL — ABNORMAL HIGH (ref 70–99)

## 2024-09-19 LAB — COMPREHENSIVE METABOLIC PANEL WITH GFR
ALT: 8 U/L (ref 0–44)
AST: 17 U/L (ref 15–41)
Albumin: 3.8 g/dL (ref 3.5–5.0)
Alkaline Phosphatase: 109 U/L (ref 38–126)
Anion gap: 11 (ref 5–15)
BUN: 17 mg/dL (ref 6–20)
CO2: 27 mmol/L (ref 22–32)
Calcium: 9.8 mg/dL (ref 8.9–10.3)
Chloride: 99 mmol/L (ref 98–111)
Creatinine, Ser: 1.3 mg/dL — ABNORMAL HIGH (ref 0.44–1.00)
GFR, Estimated: 47 mL/min — ABNORMAL LOW
Glucose, Bld: 602 mg/dL (ref 70–99)
Potassium: 3.7 mmol/L (ref 3.5–5.1)
Sodium: 137 mmol/L (ref 135–145)
Total Bilirubin: 0.5 mg/dL (ref 0.0–1.2)
Total Protein: 7.1 g/dL (ref 6.5–8.1)

## 2024-09-19 LAB — HEMOGLOBIN A1C
Hgb A1c MFr Bld: 12.2 % — ABNORMAL HIGH (ref 4.8–5.6)
Mean Plasma Glucose: 303.44 mg/dL

## 2024-09-19 LAB — BETA-HYDROXYBUTYRIC ACID: Beta-Hydroxybutyric Acid: 0.16 mmol/L (ref 0.05–0.27)

## 2024-09-19 LAB — PROCALCITONIN: Procalcitonin: <0.1 ng/mL

## 2024-09-19 MED ORDER — INSULIN ASPART 100 UNIT/ML IJ SOLN
0.0000 [IU] | INTRAMUSCULAR | Status: DC
  Administered 2024-09-19: 2 [IU] via SUBCUTANEOUS
  Administered 2024-09-19: 1 [IU] via SUBCUTANEOUS
  Administered 2024-09-19: 4 [IU] via SUBCUTANEOUS
  Administered 2024-09-20 (×2): 1 [IU] via SUBCUTANEOUS
  Filled 2024-09-19 (×3): qty 1
  Filled 2024-09-19: qty 4
  Filled 2024-09-19: qty 2

## 2024-09-19 MED ORDER — ONDANSETRON HCL 4 MG PO TABS: 4.0000 mg | ORAL_TABLET | Freq: Four times a day (QID) | ORAL | Status: DC | PRN

## 2024-09-19 MED ORDER — ATORVASTATIN CALCIUM 20 MG PO TABS
40.0000 mg | ORAL_TABLET | Freq: Every day | ORAL | Status: DC
  Administered 2024-09-19: 40 mg via ORAL
  Filled 2024-09-19: qty 2

## 2024-09-19 MED ORDER — PANTOPRAZOLE SODIUM 40 MG PO TBEC
40.0000 mg | DELAYED_RELEASE_TABLET | Freq: Every day | ORAL | Status: DC
  Administered 2024-09-19 – 2024-09-20 (×2): 40 mg via ORAL
  Filled 2024-09-19 (×2): qty 1

## 2024-09-19 MED ORDER — ACETAMINOPHEN 650 MG RE SUPP: 650.0000 mg | Freq: Four times a day (QID) | RECTAL | Status: DC | PRN

## 2024-09-19 MED ORDER — ENOXAPARIN SODIUM 40 MG/0.4ML IJ SOSY
40.0000 mg | PREFILLED_SYRINGE | INTRAMUSCULAR | Status: DC
  Administered 2024-09-19 – 2024-09-20 (×2): 40 mg via SUBCUTANEOUS
  Filled 2024-09-19 (×2): qty 0.4

## 2024-09-19 MED ORDER — INSULIN ASPART 100 UNIT/ML IJ SOLN
8.0000 [IU] | Freq: Once | INTRAMUSCULAR | Status: AC
  Administered 2024-09-19: 8 [IU] via SUBCUTANEOUS
  Filled 2024-09-19: qty 8

## 2024-09-19 MED ORDER — TRAZODONE HCL 100 MG PO TABS: 300.0000 mg | ORAL_TABLET | Freq: Every day | ORAL | Status: DC

## 2024-09-19 MED ORDER — ALBUTEROL SULFATE HFA 108 (90 BASE) MCG/ACT IN AERS: 1.0000 | INHALATION_SPRAY | Freq: Four times a day (QID) | RESPIRATORY_TRACT | Status: DC | PRN

## 2024-09-19 MED ORDER — OMEGA-3-ACID ETHYL ESTERS 1 G PO CAPS
1.0000 g | ORAL_CAPSULE | Freq: Every day | ORAL | Status: DC
  Administered 2024-09-19 – 2024-09-20 (×2): 1 g via ORAL
  Filled 2024-09-19 (×2): qty 1

## 2024-09-19 MED ORDER — GABAPENTIN 300 MG PO CAPS: 600.0000 mg | ORAL_CAPSULE | Freq: Every day | ORAL | Status: DC | PRN

## 2024-09-19 MED ORDER — INSULIN GLARGINE-YFGN 100 UNIT/ML ~~LOC~~ SOLN
65.0000 [IU] | Freq: Every day | SUBCUTANEOUS | Status: DC
  Administered 2024-09-19: 65 [IU] via SUBCUTANEOUS
  Filled 2024-09-19 (×2): qty 0.65

## 2024-09-19 MED ORDER — LOPERAMIDE HCL 2 MG PO CAPS: 2.0000 mg | ORAL_CAPSULE | Freq: Four times a day (QID) | ORAL | Status: DC | PRN

## 2024-09-19 MED ORDER — SODIUM CHLORIDE 0.9 % IV SOLN: INTRAVENOUS | Status: DC

## 2024-09-19 MED ORDER — LACTATED RINGERS IV BOLUS
1000.0000 mL | Freq: Once | INTRAVENOUS | Status: AC
  Administered 2024-09-19: 1000 mL via INTRAVENOUS

## 2024-09-19 MED ORDER — ONDANSETRON HCL 4 MG/2ML IJ SOLN: 4.0000 mg | Freq: Four times a day (QID) | INTRAMUSCULAR | Status: DC | PRN

## 2024-09-19 MED ORDER — SODIUM CHLORIDE 0.9 % IV SOLN
1.0000 g | Freq: Once | INTRAVENOUS | Status: AC
  Administered 2024-09-20: 1 g via INTRAVENOUS
  Filled 2024-09-19: qty 10

## 2024-09-19 MED ORDER — ACETAMINOPHEN 325 MG PO TABS: 650.0000 mg | ORAL_TABLET | Freq: Four times a day (QID) | ORAL | Status: DC | PRN

## 2024-09-19 MED ORDER — SODIUM CHLORIDE 0.9 % IV SOLN
1.0000 g | Freq: Once | INTRAVENOUS | Status: AC
  Administered 2024-09-19: 1 g via INTRAVENOUS
  Filled 2024-09-19: qty 10

## 2024-09-19 MED ORDER — FLUTICASONE FUROATE-VILANTEROL 100-25 MCG/ACT IN AEPB
1.0000 | INHALATION_SPRAY | Freq: Every day | RESPIRATORY_TRACT | Status: DC
  Administered 2024-09-19 – 2024-09-20 (×2): 1 via RESPIRATORY_TRACT
  Filled 2024-09-19: qty 28

## 2024-09-19 MED ORDER — SERTRALINE HCL 50 MG PO TABS
100.0000 mg | ORAL_TABLET | Freq: Every day | ORAL | Status: DC
  Administered 2024-09-19 – 2024-09-20 (×2): 100 mg via ORAL
  Filled 2024-09-19 (×2): qty 2

## 2024-09-19 MED ORDER — POTASSIUM CHLORIDE CRYS ER 20 MEQ PO TBCR
40.0000 meq | EXTENDED_RELEASE_TABLET | Freq: Once | ORAL | Status: AC
  Administered 2024-09-19: 40 meq via ORAL
  Filled 2024-09-19: qty 2

## 2024-09-19 MED ORDER — ASPIRIN 81 MG PO TBEC
81.0000 mg | DELAYED_RELEASE_TABLET | Freq: Every day | ORAL | Status: DC
  Administered 2024-09-19 – 2024-09-20 (×2): 81 mg via ORAL
  Filled 2024-09-19 (×2): qty 1

## 2024-09-19 MED ORDER — MAGNESIUM HYDROXIDE 400 MG/5ML PO SUSP: 30.0000 mL | Freq: Every day | ORAL | Status: DC | PRN

## 2024-09-19 MED ORDER — ALBUTEROL SULFATE (2.5 MG/3ML) 0.083% IN NEBU: 2.5000 mg | INHALATION_SOLUTION | Freq: Four times a day (QID) | RESPIRATORY_TRACT | Status: DC | PRN

## 2024-09-19 NOTE — H&P (Signed)
 "     [place]   PATIENT NAME: Barbara Wang    MR#:  [id number]  DATE OF BIRTH:  1965-11-29  DATE OF ADMISSION:  09/18/2024  PRIMARY CARE PHYSICIAN: Theotis Haze ORN, NP   Patient is coming from: Home  REQUESTING/REFERRING PHYSICIAN: Waymond Lorelle Cummins, MD    CHIEF COMPLAINT:   Chief Complaint  Patient presents with   Hyperglycemia    HISTORY OF PRESENT ILLNESS:  Kalimah VEAR Ivins is a 59 y.o. African-American female with medical history significant for asthma, depression, hypertension, dyslipidemia, peripheral neuropathy, pancreatitis and type diabetes mellitus, presented to the emergency room with acute onset of hyperglycemia with associated polyuria and polydipsia.  She ran out of her insulin  on Monday.  She admitted to urinary frequency and urgency dysuria or hematuria however with left flank pain.  No fever or chills.  No nausea or vomiting or abdominal pain.  No cough or wheezing or dyspnea.  No chest pain or palpitations.  ED Course: When the patient came to the ER, BP was 143/83 with otherwise normal vital signs.  Labs revealed blood glucose of 602 and later 342 and creatinine 1.3 with otherwise unremarkable CMP.  CBC was within normal.  Beta-hydroxybutyrate acid was normal at 0.16.  UA was positive for UTI.  Urine culture was sent. EKG as reviewed by me : EKG showed likely sinus rhythm with a rate of 97. Imaging: Portable chest x-ray showed no acute findings.  The patient was given 8 units of subcutaneous NovoLog  and 1 g of IV Rocephin  as well as 1 L bolus of IV lactated Ringer .  She will be admitted to a medical telemetry bed for further evaluation and management. PAST MEDICAL HISTORY:   Past Medical History:  Diagnosis Date   Asthma    Brain tumor (HCC)    Cellulitis 06/2015   rt hand    Depression    History of hiatal hernia    it went away on it's own   Hyperlipemia    Hypertension    Neuropathy     feet & hands    Obesity    Pancreatitis     Type II diabetes mellitus (HCC)    insulin  dependent     PAST SURGICAL HISTORY:   Past Surgical History:  Procedure Laterality Date   ABDOMINAL HYSTERECTOMY     BREAST CYST EXCISION Right 10+ yrs ago   abscess removed   CESAREAN SECTION  1981; 1987   CHOLECYSTECTOMY  05/11/2012   Procedure: LAPAROSCOPIC CHOLECYSTECTOMY WITH INTRAOPERATIVE CHOLANGIOGRAM;  Surgeon: Camellia CHRISTELLA Blush, MD,FACS;  Location: [facility] OR;  Service: General;  Laterality: N/A;   SHOULDER ARTHROSCOPY W/ ROTATOR CUFF REPAIR Right    TUBAL LIGATION  10/25/1999   thelbert 01/11/2011    SOCIAL HISTORY:   Social History   Tobacco Use   Smoking status: Some Days    Current packs/day: 1.00    Average packs/day: 1 pack/day for 40.0 years (40.0 ttl pk-yrs)    Types: Cigarettes   Smokeless tobacco: Never  Substance Use Topics   Alcohol use: No    FAMILY HISTORY:   Family History  Problem Relation Age of Onset   Stroke Mother    Breast cancer Mother 75   Diabetes Father    Diabetes Sister    Breast cancer Maternal Aunt    Breast cancer Maternal Aunt    Breast cancer Maternal Grandmother     DRUG ALLERGIES:  Allergies[1]  REVIEW OF SYSTEMS:   ROS As  per history of present illness. All pertinent systems were reviewed above. Constitutional, HEENT, cardiovascular, respiratory, GI, GU, musculoskeletal, neuro, psychiatric, endocrine, integumentary and hematologic systems were reviewed and are otherwise negative/unremarkable except for positive findings mentioned above in the HPI.   MEDICATIONS AT HOME:   Prior to Admission medications  Medication Sig Start Date End Date Taking? Authorizing Provider  Accu-Chek Softclix Lancets lancets Use to check blood sugar 3 times daily. 12/06/23   Newlin, Enobong, MD  albuterol  (PROAIR  HFA) 108 (90 Base) MCG/ACT inhaler Inhale 1-2 puffs into the lungs every 6 (six) hours as needed for wheezing or shortness of breath. 07/31/23   Fleming, Zelda W, NP  albuterol  (PROVENTIL ) (2.5  MG/3ML) 0.083% nebulizer solution Take 3 mLs (2.5 mg total) by nebulization every 6 (six) hours as needed for wheezing or shortness of breath. 07/31/23   Fleming, Zelda W, NP  aspirin  EC 81 MG tablet Take 1 tablet (81 mg total) by mouth daily. 09/24/23   Theotis Haze ORN, NP  atorvastatin  (LIPITOR) 40 MG tablet Take 1 tablet (40 mg total) by mouth daily at 6 PM. 09/24/23   Fleming, Zelda W, NP  Blood Glucose Monitoring Suppl (ACCU-CHEK GUIDE ME) w/Device KIT Use to check blood glucose in the morning, at noon, and at bedtime. 02/19/24   Elnora Ip, MD  Blood Glucose Monitoring Suppl (ACCU-CHEK GUIDE) w/Device KIT Use to check blood sugar 3 times daily. 12/06/23   Newlin, Enobong, MD  budesonide -formoterol  (SYMBICORT ) 160-4.5 MCG/ACT inhaler Inhale 2 puffs into the lungs 2 (two) times daily. 07/31/23   Fleming, Zelda W, NP  Continuous Glucose Receiver (FREESTYLE LIBRE 3 READER) DEVI Use to check blood sugars continuously daily. 02/19/24   Elnora Ip, MD  Continuous Glucose Sensor (FREESTYLE LIBRE 3 SENSOR) MISC Place 1 sensor on the skin every 15 days. Use to check glucose continuously 02/19/24   Elnora Ip, MD  fluticasone -salmeterol (ADVAIR  HFA) 230-21 MCG/ACT inhaler Inhale 2 puffs into the lungs 2 (two) times daily. 07/31/23   Fleming, Zelda W, NP  gabapentin  (NEURONTIN ) 600 MG tablet Take 1 tablet (600 mg total) by mouth daily as needed (for neuropathy in hands and feet). 09/29/22   Fleming, Zelda W, NP  glucose blood (ACCU-CHEK GUIDE TEST) test strip Use to check blood sugar 3 times daily. 12/06/23   Newlin, Enobong, MD  insulin  degludec (TRESIBA  FLEXTOUCH) 200 UNIT/ML FlexTouch Pen Inject 60 Units into the skin daily. 05/29/24   Fleming, Zelda W, NP  Insulin  Pen Needle 32G X 4 MM MISC Use with insulin , 3 (three) times daily. Patient not taking: Reported on 02/20/2024 02/19/24   Gomez-Caraballo, Maria, MD  [Paused] lisinopril -hydrochlorothiazide  (ZESTORETIC ) 20-25 MG tablet  Take 1 tablet by mouth daily. Patient not taking: Reported on 02/20/2024 Wait to take this until your doctor or other care provider tells you to start again. 09/24/23   Fleming, Zelda W, NP  loperamide  (IMODIUM  A-D) 2 MG tablet Take 2 mg by mouth 4 (four) times daily as needed for diarrhea or loose stools.    [provider]  Misc. Devices MISC Please provide patient with incontinence brief of choice. N39.42 03/18/24   Fleming, Zelda W, NP  mometasone  (ELOCON ) 0.1 % cream Apply topically daily. 07/31/23   Fleming, Zelda W, NP  omega-3 acid ethyl esters (LOVAZA ) 1 g capsule Take 1 capsule (1 g total) by mouth daily. 09/24/23   Fleming, Zelda W, NP  pantoprazole  (PROTONIX ) 40 MG tablet Take 1 tablet (40 mg total) by mouth daily. 09/24/23  Theotis Haze ORN, NP  [Paused] potassium chloride  SA (KLOR-CON  M) 20 MEQ tablet Take 1 tablet (20 mEq total) by mouth 2 (two) times daily. Patient not taking: Reported on 02/20/2024 Wait to take this until your doctor or other care provider tells you to start again. 09/24/23   Theotis Haze ORN, NP  Semaglutide , 1 MG/DOSE, 4 MG/3ML SOPN Inject 1 mg into the skin once a week. 09/24/23   Fleming, Zelda W, NP  sertraline  (ZOLOFT ) 100 MG tablet Take 1 tablet (100 mg total) by mouth daily FOR DEPRESSION. 09/24/23   Fleming, Zelda W, NP  trazodone  (DESYREL ) 300 MG tablet Take 1 tablet (300 mg total) by mouth at bedtime. 09/01/23   Fleming, Zelda W, NP  insulin  NPH Human (HUMULIN  N) 100 UNIT/ML injection Inject 50 Units into the skin 2 times daily with meals for 30 days. (discard after 31 days) 09/23/21 10/06/21        VITAL SIGNS:  Blood pressure (!) 161/90, pulse 98, temperature (!) 97.3 F (36.3 C), resp. rate 14, height 5' 4 (1.626 m), weight 84.8 kg, SpO2 96%.  PHYSICAL EXAMINATION:  Physical Exam  GENERAL:  59 y.o.-year-old African American female patient lying in the bed with no acute distress.  EYES: Pupils equal, round, reactive to light and accommodation. No  scleral icterus. Extraocular muscles intact.  HEENT: Head atraumatic, normocephalic. Oropharynx and nasopharynx clear.  NECK:  Supple, no jugular venous distention. No thyroid  enlargement, no tenderness.  LUNGS: Normal breath sounds bilaterally, no wheezing, rales,rhonchi or crepitation. No use of accessory muscles of respiration.  CARDIOVASCULAR: Regular rate and rhythm, S1, S2 normal. No murmurs, rubs, or gallops.  ABDOMEN: Soft, nondistended, nontender. Bowel sounds present. No organomegaly or mass.  EXTREMITIES: No pedal edema, cyanosis, or clubbing.  NEUROLOGIC: Cranial nerves II through XII are intact. Muscle strength 5/5 in all extremities. Sensation intact. Gait not checked.  PSYCHIATRIC: The patient is alert and oriented x 3.  Normal affect and good eye contact. SKIN: No obvious rash, lesion, or ulcer.   LABORATORY PANEL:   CBC Recent Labs  Lab 09/19/24 0358  WBC 7.8  HGB 13.3  HCT 39.1  PLT 327   ------------------------------------------------------------------------------------------------------------------  Chemistries  Recent Labs  Lab 09/19/24 0017 09/19/24 0358  NA 137 142  K 3.7 3.2*  CL 99 103  CO2 27 27  GLUCOSE 602* 342*  BUN 17 16  CREATININE 1.30* 1.13*  CALCIUM  9.8 9.9  AST 17  --   ALT 8  --   ALKPHOS 109  --   BILITOT 0.5  --    ------------------------------------------------------------------------------------------------------------------  Cardiac Enzymes No results for input(s): TROPONINI in the last 168 hours. ------------------------------------------------------------------------------------------------------------------  RADIOLOGY:  No results found.    IMPRESSION AND PLAN:  Assessment and Plan: * Uncontrolled type 2 diabetes mellitus with hyperglycemia (HCC) - The patient will be admitted to a medical telemetry bed. - Will continue management for his hyperglycemia with subcutaneous NovoLog  with current fingerstick blood  glucose measurements with resistant supplemental NovoLog . - The patient will be hydrated with IV normal saline. - This is clearly secondary to her running out of insulin  and UTI. - Will continue basal coverage.  Acute lower UTI - Will continue IV Rocephin  and follow urine culture and sensitivity.  AKI (acute kidney injury) - This is likely prerenal. - Will avoid nephrotoxins and hydrate with IV normal saline while following BMP  Asthma, chronic - Will continue her inhalers.  Peripheral neuropathy - This is likely diabetic neuropathy. -  Will continue Neurontin .  Anxiety and depression Will continue his SSRI therapy.  Dyslipidemia - Will continue statin therapy.   DVT prophylaxis: Lovenox . Advanced Care Planning:  Code Status: full code. Family Communication:  The plan of care was discussed in details with the patient (and family). I answered all questions. The patient agreed to proceed with the above mentioned plan. Further management will depend upon hospital course. Disposition Plan: Back to previous home environment Consults called: none. All the records are reviewed and case discussed with ED provider.  Status is: Inpatient  At the time of the admission, it appears that the appropriate admission status for this patient is inpatient.  This is judged to be reasonable and necessary in order to provide the required intensity of service to ensure the patient's safety given the presenting symptoms, physical exam findings and initial radiographic and laboratory data in the context of comorbid conditions.  The patient requires inpatient status due to high intensity of service, high risk of further deterioration and high frequency of surveillance required.  I certify that at the time of admission, it is my clinical judgment that the patient will require inpatient hospital care extending more than 2 midnights.                            Dispo: The patient is from: Home               Anticipated d/c is to: Home              Patient currently is not medically stable to d/c.              Difficult to place patient: No  Madison DELENA Peaches M.D on 09/19/2024 at 5:50 AM  [facility]   From 7 PM-7 AM, contact night-coverage [email or web address]  CC: Primary care physician; Theotis Haze ORN, NP     [1]  Allergies Allergen Reactions   Penicillins Hives and Shortness Of Breath   Azithromycin Hives   Erythromycin Base Hives   Sulfa Antibiotics Hives   Tramadol  Hives   "

## 2024-09-19 NOTE — Assessment & Plan Note (Signed)
 Will continue statin therapy

## 2024-09-19 NOTE — Inpatient Diabetes Management (Addendum)
 Inpatient Diabetes Program Recommendations  AACE/ADA: New Consensus Statement on Inpatient Glycemic Control (2015)  Target Ranges:  Prepandial:   less than 140 mg/dL      Peak postprandial:   less than 180 mg/dL (1-2 hours)      Critically ill patients:  140 - 180 mg/dL    Latest Reference Range & Units 09/19/24 00:17  Glucose 70 - 99 mg/dL 397 (HH)  (HH): Data is critically high  Latest Reference Range & Units 09/19/24 00:04 09/19/24 02:21 09/19/24 04:00  Glucose-Capillary 70 - 99 mg/dL 513 (H) 530 (H)  8 units Novolog   418 (H)  4 units Novolog    (H): Data is abnormally high   Admit with: Hyperglycemia/ Ran out of Insulin  on Monday/ UTI  History: DM  Home DM Meds: Tresiba  65 units daily        Humalog  10 units TID with meals        Ozempic  1 mg Qweek        FSL3 CGM  Current Orders: Semglee  65 units daily     Novolog  Sensitive Correction Scale/ SSI (0-9 units) Q4H    PCP: [facility]  Note home dose basal insulin  will start this AM  Novolog  SSI started overnight  Current A1c 12.2%   Addendum 12:20pm--Met w/ pt at bedside.  She told me she ran out of her insulin  and Ozempic  on Monday.  Her PCP office ([facility]) will not refill meds until she makes a follow up appt (pt not sure of the last time she was seen)--Looks like maybe Jan 2025.  Uses the Freestyle Libre 3 glucose sensors with a reader at home but ran out of sensors as well.  Reviewed her current A1c with her and the need for better CBG control at home.  Reviewed goal A1c and goal CBGs for home.  Pt told me she often forgets to take her insulin .  Asked pt to please try to set alarms on her smart phone to remind her.  Lives with her Dtrs but stated they do not help with her meds.      --Will follow patient during hospitalization--  Adina Rudolpho Arrow RN, MSN, CDCES Diabetes Coordinator Inpatient Glycemic Control Team Team Pager: [phone] (8a-5p)

## 2024-09-19 NOTE — Assessment & Plan Note (Signed)
-   This is likely prerenal. - Will avoid nephrotoxins and hydrate with IV normal saline while following BMP

## 2024-09-19 NOTE — Assessment & Plan Note (Signed)
-   Will continue her inhalers.

## 2024-09-19 NOTE — Assessment & Plan Note (Signed)
Will continue IV Rocephin and follow urine culture and sensitivity.

## 2024-09-19 NOTE — ED Triage Notes (Signed)
 Pt was BIB [facility] from home. Per EMS report, family informed ems over the past 3 days pt has been sleeping more than normal. Check her blood sugar at home - > 600 reading. Family gave 10 units of insulin  (unknown name).  EMS gave 250 cc NS IVF, 20G R forearm. EMS CBG 589, VS 164/82, 98% RA, PR 97, and RR 17. On ED arrival pt A&Ox4. Reports being out of her DM medication for a week d/t missing her PCP appt.

## 2024-09-19 NOTE — ED Provider Notes (Signed)
 Barbara Wang Provider Note    Event Date/Time   First MD Initiated Contact with Patient 09/19/24 0000     (approximate)   History   Hyperglycemia   HPI  Barbara Wang is a 59 y.o. female with history of asthma, diabetes, presenting with hyperglycemia.  States she been feeling generally weak.  Denies any nausea vomiting or diarrhea.  No urinary symptoms, no chest pain or shortness of breath, no cough or fever.  States she ran out of her diabetes medications a week ago.  Per independent history from EMS, family had told them that the last 3 days, patient has been sleeping more than normal.  Sugars have been reading more than 600 at home.  They did give her 10 units of long-acting insulin  but they do not know the name.  For them she was alert and oriented, ran out of her diabetes medication for about a week due to missing her PCP appointment.  They did give her 250 cc of normal saline, blood glucose was 589 for them.  On interview, she was admitted in June of last year for hyperglycemia and AKI, she does have history of cognitive impairment, below the impairment, history of CVA.     Physical Exam   Triage Vital Signs: ED Triage Vitals  Encounter Vitals Group     BP      Girls Systolic BP Percentile      Girls Diastolic BP Percentile      Boys Systolic BP Percentile      Boys Diastolic BP Percentile      Pulse      Resp      Temp      Temp src      SpO2      Weight      Height      Head Circumference      Peak Flow      Pain Score      Pain Loc      Pain Education      Exclude from Growth Chart     Most recent vital signs: Vitals:   09/19/24 0012 09/19/24 0015  BP: (!) 143/83   Pulse: 98 96  Resp: 20   Temp: 97.9 F (36.6 C)   SpO2: 100% 95%     General: Awake, no distress.  CV:  Good peripheral perfusion.  Resp:  Normal effort.  No tachypnea or respiratory distress Abd:  No distention.  Soft nontender Other:  No lower  extremity edema   ED Results / Procedures / Treatments   Labs (all labs ordered are listed, but only abnormal results are displayed) Labs Reviewed  COMPREHENSIVE METABOLIC PANEL WITH GFR - Abnormal; Notable for the following components:      Result Value   Glucose, Bld 602 (*)    Creatinine, Ser 1.30 (*)    GFR, Estimated 47 (*)    All other components within normal limits  BLOOD GAS, VENOUS - Abnormal; Notable for the following components:   pO2, Ven <31 (*)    Bicarbonate 31.9 (*)    Acid-Base Excess 5.3 (*)    All other components within normal limits  URINALYSIS, W/ REFLEX TO CULTURE (INFECTION SUSPECTED) - Abnormal; Notable for the following components:   Color, Urine YELLOW (*)    APPearance CLOUDY (*)    Glucose, UA >=500 (*)    Hgb urine dipstick SMALL (*)    Protein, ur 100 (*)  Leukocytes,Ua LARGE (*)    Bacteria, UA FEW (*)    All other components within normal limits  CBG MONITORING, ED - Abnormal; Notable for the following components:   Glucose-Capillary 486 (*)    All other components within normal limits  URINE CULTURE  CBC WITH DIFFERENTIAL/PLATELET  BETA-HYDROXYBUTYRIC ACID     EKG  EKG shows, EKG shows sinus rhythm, normal QS, normal QTc, no obvious ischemic ST elevation, T wave flattening to inferior leads, this change compared to prior    PROCEDURES:  Critical Care performed: No  Procedures   MEDICATIONS ORDERED IN ED: Medications  insulin  aspart (novoLOG ) injection 8 Units (has no administration in time range)  cefTRIAXone  (ROCEPHIN ) 1 g in sodium chloride  0.9 % 100 mL IVPB (has no administration in time range)  lactated ringers  bolus 1,000 mL (1,000 mLs Intravenous New Bag/Given 09/19/24 0025)     IMPRESSION / MDM / ASSESSMENT AND PLAN / ED COURSE  I reviewed the triage vital signs and the nursing notes.                              Differential diagnosis includes, but is not limited to, hyperglycemia, DKA, medication  noncompliance, to consider electrolyte derangements, UTI, dehydration.  Labs, UA.  IV fluids.  Patient's presentation is most consistent with acute presentation with potential threat to life or bodily function.  Independent interpretation of labs and imaging below.  Clinical course as below.  Labs are consistent with hyperglycemia without evidence of DKA, she does have UTI, prior urine cultures were mixed flora.  Will start her on ceftriaxone  IV.  Will plan to have her admitted for further management.  Consult the hospitalist.  She is admitted.    Clinical Course as of 09/19/24 [id number]  [place] Sep 19, 2024  0128 Independent review of labs, she is hyperglycemic, LFTs are normal, no leukocytosis, beta-hydroxybutyrate is not elevated, no acidosis. [TT]  0153 Urinalysis, w/ Reflex to Culture (Infection Suspected) -Urine, Clean Catch(!) UA is consistent with UTI. [TT]    Clinical Course User Index [TT] Waymond, Lorelle Cummins, MD     FINAL CLINICAL IMPRESSION(S) / ED DIAGNOSES   Final diagnoses:  Hyperglycemia  Urinary tract infection without hematuria, site unspecified     Rx / DC Orders   ED Discharge Orders     None        Note:  This document was prepared using Dragon voice recognition software and may include unintentional dictation errors.    Way[place] Lorelle Cummins, MD 09/19/24 [phone]

## 2024-09-19 NOTE — Assessment & Plan Note (Addendum)
-   The patient will be admitted to a medical telemetry bed. - Will continue management for his hyperglycemia with subcutaneous NovoLog  with current fingerstick blood glucose measurements with resistant supplemental NovoLog . - The patient will be hydrated with IV normal saline. - This is clearly secondary to her running out of insulin  and UTI. - Will continue basal coverage.

## 2024-09-19 NOTE — Progress Notes (Signed)
 Pt ambulated to bathroom from bed with walker and stand by assist x 1

## 2024-09-19 NOTE — Assessment & Plan Note (Signed)
-   This is likely diabetic neuropathy. - Will continue Neurontin .

## 2024-09-19 NOTE — Assessment & Plan Note (Signed)
 Will continue his SSRI therapy.

## 2024-09-19 NOTE — Progress Notes (Signed)
 Pt's daughter Ailea Rhatigan updated on patient's status by this RN

## 2024-09-19 NOTE — Progress Notes (Signed)
 Brief hospitalist update note.  This is a nonbillable note.  Please see same-day H&P for full billable details.  Briefly, this is a 59 year old African-American female history significant for insulin -dependent diabetes mellitus who presented to the ER with acute onset of hyperglycemia with associated polyuria and polydipsia.  Patient states she ran out of her insulin  on Monday 1/19.  When asked why she ran out of insulin  she was unable to provide an answer.  She did give me the name of her primary care physician.  Glucose initially greater than 600 without evidence of diabetic ketoacidosis.  Urinalysis concerning for possible UTI and patient is endorsing polyuria and polydipsia  Plan: Diabetic coordinator consulted.  Patient restarted on home regimen of 65 units of Lantus .  Sliding scale.  Monitor blood sugars AC and at bedtime.  Rocephin  for UTI.  Add on urine culture.  If sugars improved anticipate discharge home/24.  Calvin Robson MD  No charge

## 2024-09-20 ENCOUNTER — Other Ambulatory Visit: Payer: Self-pay

## 2024-09-20 DIAGNOSIS — E1165 Type 2 diabetes mellitus with hyperglycemia: Secondary | ICD-10-CM | POA: Diagnosis not present

## 2024-09-20 LAB — GLUCOSE, CAPILLARY
Glucose-Capillary: 126 mg/dL — ABNORMAL HIGH (ref 70–99)
Glucose-Capillary: 143 mg/dL — ABNORMAL HIGH (ref 70–99)
Glucose-Capillary: 71 mg/dL (ref 70–99)
Glucose-Capillary: 88 mg/dL (ref 70–99)

## 2024-09-20 MED ORDER — TRESIBA FLEXTOUCH 200 UNIT/ML ~~LOC~~ SOPN
65.0000 [IU] | PEN_INJECTOR | Freq: Every day | SUBCUTANEOUS | 0 refills | Status: DC
  Filled 2024-09-20: qty 18, 54d supply, fill #0

## 2024-09-20 MED ORDER — ACCU-CHEK SOFTCLIX LANCETS MISC
Freq: Three times a day (TID) | 0 refills | Status: AC
  Filled 2024-09-20: qty 100, 33d supply, fill #0

## 2024-09-20 MED ORDER — ACCU-CHEK GUIDE ME W/DEVICE KIT
1.0000 | PACK | Freq: Three times a day (TID) | 0 refills | Status: AC
  Filled 2024-09-20: qty 1, 30d supply, fill #0

## 2024-09-20 MED ORDER — ALBUTEROL SULFATE HFA 108 (90 BASE) MCG/ACT IN AERS
1.0000 | INHALATION_SPRAY | Freq: Four times a day (QID) | RESPIRATORY_TRACT | 1 refills | Status: AC | PRN
  Filled 2024-09-20: qty 6.7, 25d supply, fill #0

## 2024-09-20 MED ORDER — ACCU-CHEK GUIDE TEST VI STRP
ORAL_STRIP | 0 refills | Status: AC
  Filled 2024-09-20: qty 100, 30d supply, fill #0

## 2024-09-20 MED ORDER — FREESTYLE LIBRE 3 READER DEVI
1.0000 [IU] | Freq: Every day | 3 refills | Status: AC
  Filled 2024-09-20: qty 1, 30d supply, fill #0

## 2024-09-20 NOTE — TOC Initial Note (Addendum)
 Transition of Care [facility]) - Initial/Assessment Note    Patient Details  Name: Barbara Wang MRN: [id number] Date of Birth: 1966-02-20  Transition of Care [facility]) CM/SW Contact:    Victory Jackquline RAMAN, RN Phone Number: 09/20/2024, 2:18 PM  Clinical Narrative:                 11:52 am: RNCM received a message from the MD stating that the patient was ready for discharge and the patient needed a ride home. RNCM called patient's daughter Barbara Wang @ [phone] and [phone] and both lines are out of services. Also called patient's other daughter Barbara Wang @ [phone] and left a message informing her that her mom was being discharged home and needs someone to pick her up. Awaiting a call back.   1:51 pm: RNCM  spoke to the patient at [phone] and informed her that I was unable to reach either of her daughters and asked if she had another number and she said no. Also asked if there was someone home to receive her and she said that her 61 year old daughter was home alone and she wasn't able to reach her or her mother Barbara Wang. I informed her that if she wasn't able to reach her that I would have to call the police to do a welfare check to make sure that she was okay. And she agreed.   2:00 pm: RNCM called [facility] Department @ [phone] and spoke with operator # [id number] and she said that she would dispatch an officer to the house to check on the child and she confirmed that the patient's correct address was [address] [place], [place], [place], not [facility].  2:20 pm: RNCM received a call fro Deputy Barbara Wang informing me that he was in route and would give me a call to let me know if the grandchild was okay.   2:33 pm: RNCM received a call back from [employer] informing me that the granddaughter was okay and her mom Barbara Wang was there and states that she is on her way to pick up her mom from the hospital. Deputy Barbara Wang confirmed Barbara Wang's phone number for me as [phone]. MD and bedside nurse  made aware that daughter was on the way.   Expected Discharge Plan: Home/Self Care Barriers to Discharge: Transportation   Patient Goals and CMS Choice            Expected Discharge Plan and Services       Living arrangements for the past 2 months: Single Family Home Expected Discharge Date: 09/20/24                                    Prior Living Arrangements/Services Living arrangements for the past 2 months: Single Family Home Lives with:: Self, Minor Children (Granddaughter) Patient language and need for interpreter reviewed:: Yes Do you feel safe going back to the place where you live?: Yes      Need for Family Participation in Patient Care: Yes (Comment) Care giver support system in place?: Yes (comment)      Activities of Daily Living   ADL Screening (condition at time of admission) Independently performs ADLs?: Yes (appropriate for developmental age) Is the patient deaf or have difficulty hearing?: No Does the patient have difficulty seeing, even when wearing glasses/contacts?: No Does the patient have difficulty concentrating, remembering, or making decisions?: No  Permission Sought/Granted  Emotional Assessment Appearance:: Appears stated age Attitude/Demeanor/Rapport: Gracious Affect (typically observed): Calm, Quiet, Pleasant Orientation: : Oriented to Self, Oriented to Place, Oriented to  Time, Oriented to Situation Alcohol / Substance Use: Not Applicable Psych Involvement: No (comment)  Admission diagnosis:  Hyperglycemia [R73.9] Urinary tract infection without hematuria, site unspecified [N39.0] Uncontrolled type 2 diabetes mellitus with hyperglycemia (HCC) [E11.65] Patient Active Problem List   Diagnosis Date Noted   Uncontrolled type 2 diabetes mellitus with hyperglycemia (HCC) 09/19/2024   Acute lower UTI 09/19/2024   Dyslipidemia 09/19/2024   Anxiety and depression 09/19/2024   Peripheral neuropathy 09/19/2024    AKI (acute kidney injury) 09/19/2024   Asthma, chronic 09/19/2024   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) 02/17/2024   Chronic diarrhea 02/17/2024   Subclinical hyperthyroidism 02/16/2024   Urinary incontinence due diabetic neuropathy 02/16/2024   Impaired mobility 02/16/2024   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   Hx of Lacunar infarction (HCC) 02/16/2024   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   Hyperglycemia 02/14/2024   DKA (diabetic ketoacidosis) (HCC) 08/23/2023   Acute metabolic encephalopathy 08/23/2023   Acute kidney injury 05/14/2022   Moderate episode of recurrent major depressive disorder (HCC) 01/26/2022   Vitamin D  deficiency 01/26/2022   Antibiotic-induced yeast infection 01/26/2022   DKA, type 2, not at goal [facility]) 09/30/2021   Diabetic ketoacidosis without coma associated with diabetes mellitus due to underlying condition (HCC) 09/18/2021   Pain in joint of right knee 09/14/2021   Osteoarthritis of knee 08/16/2021   Poorly controlled type 2 diabetes mellitus with complication (HCC) 04/27/2021   Essential hypertension 04/27/2021   Wrist pain, acute, right 07/22/2019   Mixed hyperlipidemia 07/22/2019   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 [address], [address] [place] [address] Phone: [phone] Fax: [phone]  [facility] [address], [place] - [address] [facility] [place] [address] Phone: [phone] Fax: [phone]  [id number] [place], [place] - [address] AT [facility] [address] [place] [address] Phone: [phone] Fax:  [phone]  [facility] [address] [place] [address] Phone: [phone] Fax: [phone]     Social Drivers of Health (SDOH) Social History: SDOH Screenings   Food Insecurity: Food Insecurity Present (09/19/2024)  Housing: High Risk (09/19/2024)  Transportation Needs: Unmet Transportation Needs (09/19/2024)  Utilities: At Risk (09/19/2024)  Depression (PHQ2-9): High Risk (09/24/2023)  Financial Resource Strain: Low Risk (07/31/2023)  Physical Activity: Inactive (07/31/2023)  Stress: No Stress Concern Present (07/31/2023)  Tobacco Use: High Risk (09/19/2024)  Health Literacy: Adequate Health Literacy (07/31/2023)   SDOH Interventions:     Readmission Risk Interventions     No data to display

## 2024-09-20 NOTE — Discharge Summary (Signed)
 Physician Discharge Summary  Barbara Wang FMW:[id number] DOB: Jul 07, 1966 DOA: 09/18/2024  PCP: Theotis Haze ORN, NP  Admit date: 09/18/2024 Discharge date: 09/20/2024  Admitted From: Home Disposition:  Home  Recommendations for Outpatient Follow-up:  Follow up with PCP in 1-2 weeks   Home Health:No  Equipment/Devices:None   Discharge Condition:Stable  CODE STATUS:FULL  Diet recommendation: Carb mod  Brief/Interim Summary: 59 y.o. African-American female with medical history significant for asthma, depression, hypertension, dyslipidemia, peripheral neuropathy, pancreatitis and type diabetes mellitus, presented to the emergency room with acute onset of hyperglycemia with associated polyuria and polydipsia.  She ran out of her insulin  on Monday.  She admitted to urinary frequency and urgency dysuria or hematuria however with left flank pain.  No fever or chills.  No nausea or vomiting or abdominal pain.  No cough or wheezing or dyspnea.  No chest pain or palpitations.   UTI ruled out.  Glycemic control improved.  Diabetes coordinator consult appreciated.  Patient's A1c is 12.2.  Educated on the importance of glycemic control and follow-up with her outpatient physicians.  Insulin  supply sent to [facility] pharmacy    Discharge Diagnoses:  Principal Problem:   Uncontrolled type 2 diabetes mellitus with hyperglycemia (HCC) Active Problems:   Acute lower UTI   AKI (acute kidney injury)   Dyslipidemia   Anxiety and depression   Peripheral neuropathy   Asthma, chronic   Uncontrolled type 2 diabetes mellitus with hyperglycemia (HCC) No evidence of diabetic ketoacidosis.  Etiology of hyperglycemia is nonadherence to home insulin  regimen.  Patient states she ran out of her insulin  and Ozempic  and her primary care physician is not able to refill her prescription without a visit.  Last documented visit with PCP is January 2025 Plan: Glycemic control improved.  Discharged home on previous  home regimen of 65 units of Lantus  a day.  Patient encouraged to seek follow-up with her primary care physician within 1 to 2 weeks of discharge.  All medications sent to [facility] pharmacy   Acute lower UTI, ruled out No evidence of infection.  Suspect asymptomatic bacteria in the setting of severe hyperglycemia  Discharge Instructions  Discharge Instructions     Diet Carb Modified   Complete by: As directed    Increase activity slowly   Complete by: As directed       Allergies as of 09/20/2024       Reactions   Penicillins Hives, Shortness Of Breath   Azithromycin Hives   Erythromycin Base Hives   Sulfa Antibiotics Hives   Tramadol  Hives        Medication List     PAUSE taking these medications    lisinopril -hydrochlorothiazide  20-25 MG tablet Wait to take this until your doctor or other care provider tells you to start again. Commonly known as: ZESTORETIC  Take 1 tablet by mouth daily.   potassium chloride  SA 20 MEQ tablet Wait to take this until your doctor or other care provider tells you to start again. Commonly known as: KLOR-CON  M Take 1 tablet (20 mEq total) by mouth 2 (two) times daily.       STOP taking these medications    FreeStyle Libre 3 Sensor Misc   insulin  lispro 100 UNIT/ML KwikPen Commonly known as: HUMALOG    mometasone  0.1 % cream Commonly known as: ELOCON    TechLite Pen Needles 32G X 4 MM Misc Generic drug: Insulin  Pen Needle       TAKE these medications    Accu-Chek Guide Me w/Device Kit  Use to check blood glucose in the morning, at noon, and at bedtime. What changed: Another medication with the same name was removed. Continue taking this medication, and follow the directions you see here.   Accu-Chek Guide Test test strip Generic drug: glucose blood Use to check blood sugar 3 times daily.   Accu-Chek Softclix Lancets lancets Use to check blood sugar 3 times daily. What changed:  when to take this additional  instructions   Advair  HFA 230-21 MCG/ACT inhaler Generic drug: fluticasone -salmeterol Inhale 2 puffs into the lungs 2 (two) times daily.   albuterol  108 (90 Base) MCG/ACT inhaler Commonly known as: ProAir  HFA Inhale 1-2 puffs into the lungs every 6 (six) hours as needed for wheezing or shortness of breath. What changed: Another medication with the same name was removed. Continue taking this medication, and follow the directions you see here.   Aspirin  Low Dose 81 MG tablet Generic drug: aspirin  EC Take 1 tablet (81 mg total) by mouth daily.   atorvastatin  40 MG tablet Commonly known as: LIPITOR Take 1 tablet (40 mg total) by mouth daily at 6 PM.   budesonide -formoterol  160-4.5 MCG/ACT inhaler Commonly known as: SYMBICORT  Inhale 2 puffs into the lungs 2 (two) times daily.   FreeStyle Libre 3 Reader [place] Use to check blood sugars continuously daily.   gabapentin  600 MG tablet Commonly known as: Neurontin  Take 1 tablet (600 mg total) by mouth daily as needed (for neuropathy in hands and feet).   loperamide  2 MG tablet Commonly known as: IMODIUM  A-D Take 2 mg by mouth 4 (four) times daily as needed for diarrhea or loose stools.   Misc. Devices Misc Please provide patient with incontinence brief of choice. N39.42   omega-3 acid ethyl esters 1 g capsule Commonly known as: Lovaza  Take 1 capsule (1 g total) by mouth daily.   Ozempic  (1 MG/DOSE) 4 MG/3ML Sopn Generic drug: Semaglutide  (1 MG/DOSE) Inject 1 mg into the skin once a week.   pantoprazole  40 MG tablet Commonly known as: PROTONIX  Take 1 tablet (40 mg total) by mouth daily.   sertraline  100 MG tablet Commonly known as: ZOLOFT  Take 1 tablet (100 mg total) by mouth daily FOR DEPRESSION.   trazodone  300 MG tablet Commonly known as: DESYREL  Take 1 tablet (300 mg total) by mouth at bedtime.   Tresiba  FlexTouch 200 UNIT/ML FlexTouch Pen Generic drug: insulin  degludec Inject 66 Units into the skin daily. What  changed: how much to take        Allergies[1]  Consultations: None   Procedures/Studies: No results found.    Subjective: Seen and examined the day of discharge.  Stable no distress.  Appropriate discharge home.  Discharge Exam: Vitals:   09/20/24 0435 09/20/24 0824  BP: (!) 149/77 (!) 164/80  Pulse: 64 65  Resp: 16 16  Temp: 98.4 F (36.9 C) 98.3 F (36.8 C)  SpO2: 100% 99%   Vitals:   09/19/24 0851 09/19/24 1639 09/20/24 0435 09/20/24 0824  BP: (!) 168/90 136/81 (!) 149/77 (!) 164/80  Pulse: 80 95 64 65  Resp: 18 18 16 16   Temp: 98 F (36.7 C) 98.4 F (36.9 C) 98.4 F (36.9 C) 98.3 F (36.8 C)  TempSrc: Oral Oral    SpO2: 100% 95% 100% 99%  Weight:      Height:        General: Pt is alert, awake, not in acute distress Cardiovascular: RRR, S1/S2 +, no rubs, no gallops Respiratory: CTA bilaterally, no wheezing, no  rhonchi Abdominal: Soft, NT, ND, bowel sounds + Extremities: no edema, no cyanosis    The results of significant diagnostics from this hospitalization (including imaging, microbiology, ancillary and laboratory) are listed below for reference.     Microbiology: Recent Results (from the past 240 hours)  Urine Culture     Status: Abnormal (Preliminary result)   Collection Time: 09/19/24 12:17 AM   Specimen: Urine, Random  Result Value Ref Range Status   Specimen Description   Final    URINE, RANDOM Performed at [facility], [address], [place], [place] [address]    Special Requests   Final    NONE Reflexed from [id number] Performed at [facility], [address], [place], [place] [address]    Culture (A)  Final    10,000 COLONIES/mL VONNE NEGATIVE RODS SUSCEPTIBILITIES TO FOLLOW Performed at [facility], [address] [place], [place] [address]    Report Status PENDING  Incomplete     Labs: BNP (last 3 results) No results for input(s): BNP in the last 8760 hours. Basic Metabolic Panel: Recent  Labs  Lab 09/19/24 0017 09/19/24 0358  NA 137 142  K 3.7 3.2*  CL 99 103  CO2 27 27  GLUCOSE 602* 342*  BUN 17 16  CREATININE 1.30* 1.13*  CALCIUM  9.8 9.9   Liver Function Tests: Recent Labs  Lab 09/19/24 0017  AST 17  ALT 8  ALKPHOS 109  BILITOT 0.5  PROT 7.1  ALBUMIN 3.8   No results for input(s): LIPASE, AMYLASE in the last 168 hours. No results for input(s): AMMONIA in the last 168 hours. CBC: Recent Labs  Lab 09/19/24 0017 09/19/24 0358  WBC 6.6 7.8  NEUTROABS 4.2  --   HGB 13.2 13.3  HCT 38.5 39.1  MCV 84.8 84.6  PLT 323 327   Cardiac Enzymes: No results for input(s): CKTOTAL, CKMB, CKMBINDEX, TROPONINI in the last 168 hours. BNP: Invalid input(s): POCBNP CBG: Recent Labs  Lab 09/19/24 2040 09/20/24 0017 09/20/24 0516 09/20/24 0833 09/20/24 1222  GLUCAP 194* 126* 71 88 143*   D-Dimer No results for input(s): DDIMER in the last 72 hours. Hgb A1c Recent Labs    09/19/24 0358  HGBA1C 12.2*   Lipid Profile No results for input(s): CHOL, HDL, LDLCALC, TRIG, CHOLHDL, LDLDIRECT in the last 72 hours. Thyroid  function studies No results for input(s): TSH, T4TOTAL, T3FREE, THYROIDAB in the last 72 hours.  Invalid input(s): FREET3 Anemia work up No results for input(s): VITAMINB12, FOLATE, FERRITIN, TIBC, IRON, RETICCTPCT in the last 72 hours. Urinalysis    Component Value Date/Time   COLORURINE YELLOW (A) 09/19/2024 0017   APPEARANCEUR CLOUDY (A) 09/19/2024 0017   APPEARANCEUR Cloudy (A) 07/31/2023 1705   LABSPEC 1.025 09/19/2024 0017   PHURINE 6.0 09/19/2024 0017   GLUCOSEU >=500 (A) 09/19/2024 0017   HGBUR SMALL (A) 09/19/2024 0017   BILIRUBINUR NEGATIVE 09/19/2024 0017   BILIRUBINUR Negative 07/31/2023 1705   KETONESUR NEGATIVE 09/19/2024 0017   PROTEINUR 100 (A) 09/19/2024 0017   UROBILINOGEN 1.0 12/29/2022 1226   UROBILINOGEN 0.2 01/15/2016 1524   NITRITE NEGATIVE 09/19/2024 0017    LEUKOCYTESUR LARGE (A) 09/19/2024 0017   Sepsis Labs Recent Labs  Lab 09/19/24 0017 09/19/24 0358  WBC 6.6 7.8   Microbiology Recent Results (from the past 240 hours)  Urine Culture     Status: Abnormal (Preliminary result)   Collection Time: 09/19/24 12:17 AM   Specimen: Urine, Random  Result Value Ref Range  Status   Specimen Description   Final    URINE, RANDOM Performed at [facility], [address] [place], [place] [address]    Special Requests   Final    NONE Reflexed from [id number] Performed at [facility], [address] [place], [place] [address]    Culture (A)  Final    10,000 COLONIES/mL VONNE NEGATIVE RODS SUSCEPTIBILITIES TO FOLLOW Performed at [facility], [address] [place], [place] [address]    Report Status PENDING  Incomplete     Time coordinating discharge: 40 minutes  SIGNED:   Calvin KATHEE Robson, MD  [facility] 09/20/2024, 2:40 PM Pager   If 7PM-7AM, please contact night-coverage     [1]  Allergies Allergen Reactions   Penicillins Hives and Shortness Of Breath   Azithromycin Hives   Erythromycin Base Hives   Sulfa Antibiotics Hives   Tramadol  Hives

## 2024-09-20 NOTE — Progress Notes (Signed)
 FSBS 71, juice given

## 2024-09-21 ENCOUNTER — Emergency Department

## 2024-09-21 ENCOUNTER — Other Ambulatory Visit: Payer: Self-pay

## 2024-09-21 ENCOUNTER — Observation Stay
Admission: EM | Admit: 2024-09-21 | Discharge: 2024-09-26 | Disposition: A | Discharge status: Skilled Nursing Facility | Attending: Internal Medicine | Admitting: Internal Medicine

## 2024-09-21 DIAGNOSIS — Z794 Long term (current) use of insulin: Secondary | ICD-10-CM | POA: Insufficient documentation

## 2024-09-21 DIAGNOSIS — R739 Hyperglycemia, unspecified: Principal | ICD-10-CM

## 2024-09-21 DIAGNOSIS — Z7982 Long term (current) use of aspirin: Secondary | ICD-10-CM | POA: Diagnosis not present

## 2024-09-21 DIAGNOSIS — N390 Urinary tract infection, site not specified: Secondary | ICD-10-CM | POA: Diagnosis not present

## 2024-09-21 DIAGNOSIS — I10 Essential (primary) hypertension: Secondary | ICD-10-CM | POA: Diagnosis not present

## 2024-09-21 DIAGNOSIS — F419 Anxiety disorder, unspecified: Secondary | ICD-10-CM | POA: Diagnosis not present

## 2024-09-21 DIAGNOSIS — R531 Weakness: Secondary | ICD-10-CM | POA: Diagnosis not present

## 2024-09-21 DIAGNOSIS — N3000 Acute cystitis without hematuria: Secondary | ICD-10-CM

## 2024-09-21 DIAGNOSIS — Z85841 Personal history of malignant neoplasm of brain: Secondary | ICD-10-CM | POA: Insufficient documentation

## 2024-09-21 DIAGNOSIS — J4520 Mild intermittent asthma, uncomplicated: Secondary | ICD-10-CM | POA: Diagnosis not present

## 2024-09-21 DIAGNOSIS — F418 Other specified anxiety disorders: Secondary | ICD-10-CM | POA: Diagnosis not present

## 2024-09-21 DIAGNOSIS — R4182 Altered mental status, unspecified: Secondary | ICD-10-CM | POA: Diagnosis not present

## 2024-09-21 DIAGNOSIS — E11649 Type 2 diabetes mellitus with hypoglycemia without coma: Secondary | ICD-10-CM | POA: Diagnosis not present

## 2024-09-21 DIAGNOSIS — Z7985 Long-term (current) use of injectable non-insulin antidiabetic drugs: Secondary | ICD-10-CM | POA: Diagnosis not present

## 2024-09-21 DIAGNOSIS — E1165 Type 2 diabetes mellitus with hyperglycemia: Secondary | ICD-10-CM | POA: Diagnosis not present

## 2024-09-21 DIAGNOSIS — J45909 Unspecified asthma, uncomplicated: Secondary | ICD-10-CM | POA: Diagnosis present

## 2024-09-21 DIAGNOSIS — F17210 Nicotine dependence, cigarettes, uncomplicated: Secondary | ICD-10-CM | POA: Insufficient documentation

## 2024-09-21 DIAGNOSIS — N179 Acute kidney failure, unspecified: Secondary | ICD-10-CM | POA: Diagnosis not present

## 2024-09-21 DIAGNOSIS — I160 Hypertensive urgency: Secondary | ICD-10-CM | POA: Diagnosis not present

## 2024-09-21 DIAGNOSIS — E663 Overweight: Secondary | ICD-10-CM | POA: Diagnosis not present

## 2024-09-21 DIAGNOSIS — J418 Mixed simple and mucopurulent chronic bronchitis: Secondary | ICD-10-CM

## 2024-09-21 DIAGNOSIS — Z79899 Other long term (current) drug therapy: Secondary | ICD-10-CM | POA: Insufficient documentation

## 2024-09-21 DIAGNOSIS — Z6829 Body mass index (BMI) 29.0-29.9, adult: Secondary | ICD-10-CM | POA: Insufficient documentation

## 2024-09-21 DIAGNOSIS — K219 Gastro-esophageal reflux disease without esophagitis: Secondary | ICD-10-CM

## 2024-09-21 DIAGNOSIS — E119 Type 2 diabetes mellitus without complications: Secondary | ICD-10-CM

## 2024-09-21 DIAGNOSIS — E876 Hypokalemia: Secondary | ICD-10-CM | POA: Diagnosis not present

## 2024-09-21 DIAGNOSIS — E1142 Type 2 diabetes mellitus with diabetic polyneuropathy: Secondary | ICD-10-CM

## 2024-09-21 DIAGNOSIS — E785 Hyperlipidemia, unspecified: Secondary | ICD-10-CM | POA: Diagnosis present

## 2024-09-21 DIAGNOSIS — F32A Depression, unspecified: Secondary | ICD-10-CM | POA: Diagnosis present

## 2024-09-21 DIAGNOSIS — F331 Major depressive disorder, recurrent, moderate: Secondary | ICD-10-CM

## 2024-09-21 DIAGNOSIS — F5101 Primary insomnia: Secondary | ICD-10-CM

## 2024-09-21 DIAGNOSIS — Z8673 Personal history of transient ischemic attack (TIA), and cerebral infarction without residual deficits: Secondary | ICD-10-CM

## 2024-09-21 DIAGNOSIS — R5383 Other fatigue: Secondary | ICD-10-CM | POA: Insufficient documentation

## 2024-09-21 LAB — CBC WITH DIFFERENTIAL/PLATELET
Abs Immature Granulocytes: 0.02 10*3/uL (ref 0.00–0.07)
Basophils Absolute: 0 10*3/uL (ref 0.0–0.1)
Basophils Relative: 0 %
Eosinophils Absolute: 0.1 10*3/uL (ref 0.0–0.5)
Eosinophils Relative: 2 %
HCT: 38.5 % (ref 36.0–46.0)
Hemoglobin: 13.2 g/dL (ref 12.0–15.0)
Immature Granulocytes: 0 %
Lymphocytes Relative: 27 %
Lymphs Abs: 1.9 10*3/uL (ref 0.7–4.0)
MCH: 28.8 pg (ref 26.0–34.0)
MCHC: 34.3 g/dL (ref 30.0–36.0)
MCV: 83.9 fL (ref 80.0–100.0)
Monocytes Absolute: 0.5 10*3/uL (ref 0.1–1.0)
Monocytes Relative: 7 %
Neutro Abs: 4.5 10*3/uL (ref 1.7–7.7)
Neutrophils Relative %: 64 %
Platelets: 312 10*3/uL (ref 150–400)
RBC: 4.59 MIL/uL (ref 3.87–5.11)
RDW: 12.6 % (ref 11.5–15.5)
WBC: 7 10*3/uL (ref 4.0–10.5)
nRBC: 0 % (ref 0.0–0.2)

## 2024-09-21 LAB — BLOOD GAS, VENOUS
Acid-Base Excess: 5.3 mmol/L — ABNORMAL HIGH (ref 0.0–2.0)
Acid-Base Excess: 3.7 mmol/L — ABNORMAL HIGH (ref 0.0–2.0)
Bicarbonate: 31.9 mmol/L — ABNORMAL HIGH (ref 20.0–28.0)
Bicarbonate: 27.7 mmol/L (ref 20.0–28.0)
O2 Saturation: 46 %
O2 Saturation: 93.8 %
Patient temperature: 37
Patient temperature: 37
pCO2, Ven: 54 mmHg (ref 44–60)
pCO2, Ven: 39 mmHg — ABNORMAL LOW (ref 44–60)
pH, Ven: 7.38 (ref 7.25–7.43)
pH, Ven: 7.46 — ABNORMAL HIGH (ref 7.25–7.43)
pO2, Ven: 62 mmHg — ABNORMAL HIGH (ref 32–45)

## 2024-09-21 LAB — URINALYSIS, ROUTINE W REFLEX MICROSCOPIC
Bilirubin Urine: NEGATIVE
Glucose, UA: >=500 mg/dL — AB
Hgb urine dipstick: NEGATIVE
Ketones, ur: NEGATIVE mg/dL
Nitrite: NEGATIVE
Protein, ur: 100 mg/dL — AB
Specific Gravity, Urine: 1.026 (ref 1.005–1.030)
WBC, UA: >50 WBC/hpf (ref 0–5)
pH: 6 (ref 5.0–8.0)

## 2024-09-21 LAB — COMPREHENSIVE METABOLIC PANEL WITH GFR
ALT: 11 U/L (ref 0–44)
AST: 23 U/L (ref 15–41)
Albumin: 4.1 g/dL (ref 3.5–5.0)
Alkaline Phosphatase: 113 U/L (ref 38–126)
Anion gap: 12 (ref 5–15)
BUN: 13 mg/dL (ref 6–20)
CO2: 26 mmol/L (ref 22–32)
Calcium: 9.7 mg/dL (ref 8.9–10.3)
Chloride: 100 mmol/L (ref 98–111)
Creatinine, Ser: 1 mg/dL (ref 0.44–1.00)
GFR, Estimated: >60 mL/min
Glucose, Bld: 478 mg/dL — ABNORMAL HIGH (ref 70–99)
Potassium: 4 mmol/L (ref 3.5–5.1)
Sodium: 138 mmol/L (ref 135–145)
Total Bilirubin: 0.5 mg/dL (ref 0.0–1.2)
Total Protein: 7.4 g/dL (ref 6.5–8.1)

## 2024-09-21 LAB — CBG MONITORING, ED
Glucose-Capillary: 437 mg/dL — ABNORMAL HIGH (ref 70–99)
Glucose-Capillary: 393 mg/dL — ABNORMAL HIGH (ref 70–99)
Glucose-Capillary: 404 mg/dL — ABNORMAL HIGH (ref 70–99)

## 2024-09-21 LAB — ETHANOL: Alcohol, Ethyl (B): <15 mg/dL

## 2024-09-21 LAB — BETA-HYDROXYBUTYRIC ACID: Beta-Hydroxybutyric Acid: 0.17 mmol/L (ref 0.05–0.27)

## 2024-09-21 LAB — OSMOLALITY: Osmolality: 316 mosm/kg — ABNORMAL HIGH (ref 275–295)

## 2024-09-21 MED ORDER — ASPIRIN 81 MG PO TBEC
81.0000 mg | DELAYED_RELEASE_TABLET | Freq: Every day | ORAL | Status: DC
  Administered 2024-09-22 – 2024-09-26 (×5): 81 mg via ORAL
  Filled 2024-09-21 (×5): qty 1

## 2024-09-21 MED ORDER — HYDRALAZINE HCL 20 MG/ML IJ SOLN: 5.0000 mg | INTRAMUSCULAR | Status: DC | PRN

## 2024-09-21 MED ORDER — PANTOPRAZOLE SODIUM 40 MG PO TBEC
40.0000 mg | DELAYED_RELEASE_TABLET | Freq: Every day | ORAL | Status: DC
  Administered 2024-09-22 – 2024-09-26 (×5): 40 mg via ORAL
  Filled 2024-09-21 (×5): qty 1

## 2024-09-21 MED ORDER — DM-GUAIFENESIN ER 30-600 MG PO TB12: 1.0000 | ORAL_TABLET | Freq: Two times a day (BID) | ORAL | Status: DC | PRN

## 2024-09-21 MED ORDER — ACETAMINOPHEN 325 MG PO TABS: 650.0000 mg | ORAL_TABLET | Freq: Four times a day (QID) | ORAL | Status: DC | PRN

## 2024-09-21 MED ORDER — INSULIN ASPART 100 UNIT/ML IJ SOLN
5.0000 [IU] | Freq: Once | INTRAMUSCULAR | Status: AC
  Administered 2024-09-21: 5 [IU] via INTRAVENOUS

## 2024-09-21 MED ORDER — ALBUTEROL SULFATE (2.5 MG/3ML) 0.083% IN NEBU: 2.5000 mg | INHALATION_SOLUTION | RESPIRATORY_TRACT | Status: DC | PRN

## 2024-09-21 MED ORDER — FLUTICASONE FUROATE-VILANTEROL 100-25 MCG/ACT IN AEPB
1.0000 | INHALATION_SPRAY | Freq: Every day | RESPIRATORY_TRACT | Status: DC
  Administered 2024-09-23 – 2024-09-26 (×4): 1 via RESPIRATORY_TRACT
  Filled 2024-09-21 (×2): qty 28

## 2024-09-21 MED ORDER — SODIUM CHLORIDE 0.9 % IV BOLUS (SEPSIS)
1000.0000 mL | Freq: Once | INTRAVENOUS | Status: AC
  Administered 2024-09-21: 1000 mL via INTRAVENOUS

## 2024-09-21 MED ORDER — INSULIN ASPART 100 UNIT/ML IJ SOLN
0.0000 [IU] | Freq: Every day | INTRAMUSCULAR | Status: DC
  Administered 2024-09-21: 10 [IU] via SUBCUTANEOUS
  Filled 2024-09-21: qty 1

## 2024-09-21 MED ORDER — INSULIN ASPART 100 UNIT/ML IJ SOLN
10.0000 [IU] | Freq: Once | INTRAMUSCULAR | Status: DC
  Filled 2024-09-21: qty 10

## 2024-09-21 MED ORDER — ATORVASTATIN CALCIUM 20 MG PO TABS
40.0000 mg | ORAL_TABLET | Freq: Every day | ORAL | Status: DC
  Administered 2024-09-22 – 2024-09-25 (×4): 40 mg via ORAL
  Filled 2024-09-21 (×4): qty 2

## 2024-09-21 MED ORDER — GABAPENTIN 300 MG PO CAPS: 600.0000 mg | ORAL_CAPSULE | Freq: Every day | ORAL | Status: DC | PRN

## 2024-09-21 MED ORDER — NICOTINE 21 MG/24HR TD PT24
21.0000 mg | MEDICATED_PATCH | Freq: Every day | TRANSDERMAL | Status: DC
  Administered 2024-09-22 – 2024-09-26 (×5): 21 mg via TRANSDERMAL
  Filled 2024-09-21 (×5): qty 1

## 2024-09-21 MED ORDER — INSULIN ASPART 100 UNIT/ML IJ SOLN: 0.0000 [IU] | Freq: Three times a day (TID) | INTRAMUSCULAR | Status: DC

## 2024-09-21 MED ORDER — LOPERAMIDE HCL 2 MG PO CAPS: 2.0000 mg | ORAL_CAPSULE | Freq: Four times a day (QID) | ORAL | Status: DC | PRN

## 2024-09-21 MED ORDER — SODIUM CHLORIDE 0.9 % IV SOLN
2.0000 g | INTRAVENOUS | Status: DC
  Administered 2024-09-21 – 2024-09-23 (×3): 2 g via INTRAVENOUS
  Filled 2024-09-21 (×3): qty 20

## 2024-09-21 MED ORDER — SODIUM CHLORIDE 0.9 % IV BOLUS: 500.0000 mL | Freq: Once | INTRAVENOUS | Status: DC

## 2024-09-21 MED ORDER — SERTRALINE HCL 50 MG PO TABS
100.0000 mg | ORAL_TABLET | Freq: Every day | ORAL | Status: DC
  Administered 2024-09-22 – 2024-09-26 (×5): 100 mg via ORAL
  Filled 2024-09-21 (×5): qty 2

## 2024-09-21 MED ORDER — ENOXAPARIN SODIUM 40 MG/0.4ML IJ SOSY
40.0000 mg | PREFILLED_SYRINGE | INTRAMUSCULAR | Status: DC
  Administered 2024-09-22 – 2024-09-26 (×5): 40 mg via SUBCUTANEOUS
  Filled 2024-09-21 (×5): qty 0.4

## 2024-09-21 MED ORDER — INSULIN GLARGINE 100 UNIT/ML ~~LOC~~ SOLN
56.0000 [IU] | Freq: Every day | SUBCUTANEOUS | Status: DC
  Administered 2024-09-21: 56 [IU] via SUBCUTANEOUS
  Filled 2024-09-21 (×2): qty 0.56

## 2024-09-21 MED ORDER — LISINOPRIL 20 MG PO TABS
20.0000 mg | ORAL_TABLET | Freq: Every day | ORAL | Status: DC
  Administered 2024-09-21 – 2024-09-24 (×4): 20 mg via ORAL
  Filled 2024-09-21 (×2): qty 2
  Filled 2024-09-21: qty 1
  Filled 2024-09-21: qty 2

## 2024-09-21 MED ORDER — ONDANSETRON HCL 4 MG/2ML IJ SOLN: 4.0000 mg | Freq: Three times a day (TID) | INTRAMUSCULAR | Status: DC | PRN

## 2024-09-21 MED ORDER — SODIUM CHLORIDE 0.9 % IV SOLN: 2.0000 g | Freq: Once | INTRAVENOUS | Status: DC

## 2024-09-21 MED ORDER — SODIUM CHLORIDE 0.9 % IV BOLUS
1000.0000 mL | Freq: Once | INTRAVENOUS | Status: AC
  Administered 2024-09-21: 1000 mL via INTRAVENOUS

## 2024-09-21 MED ORDER — TRAZODONE HCL 50 MG PO TABS
300.0000 mg | ORAL_TABLET | Freq: Every day | ORAL | Status: DC
  Administered 2024-09-22 – 2024-09-25 (×4): 300 mg via ORAL
  Filled 2024-09-21 (×2): qty 6
  Filled 2024-09-21: qty 3
  Filled 2024-09-21: qty 6

## 2024-09-21 MED ORDER — SODIUM CHLORIDE 0.9 % IV SOLN: INTRAVENOUS | Status: DC

## 2024-09-21 MED ORDER — HYDRALAZINE HCL 20 MG/ML IJ SOLN
10.0000 mg | INTRAMUSCULAR | Status: DC | PRN
  Administered 2024-09-21: 10 mg via INTRAVENOUS
  Filled 2024-09-21 (×2): qty 1

## 2024-09-21 NOTE — ED Triage Notes (Signed)
 PT BIB [facility] from home. c/o weakness. BG 564. Reports compliance with insulin . 20G LFA. BP 177/80, 71hr, 98.7 97% RA.

## 2024-09-21 NOTE — H&P (Addendum)
 " History and Physical    Barbara Wang FMW:[id number] DOB: 11/10/1965 DOA: 09/21/2024  Referring MD/NP/PA:   PCP: Theotis Haze ORN, NP   Patient coming from:  The patient is coming from home.     Chief Complaint: weakness, urinary frequency, dysuria  HPI: Barbara Wang is a 59 y.o. female with medical history significant of poorly controlled diabetes, HTN, HLD, asthma, depression with anxiety, brain tumor, neuropathy, chronic diarrhea, meningioma, who presents with weakness, urinary frequency and dysuria  Patient was recently hospitalized from 1/23 - 1/24 due to uncontrolled diabetes with glycemia.  Patient was discharged on Lantus  66 units daily.  Patient was also found to have UTI.  Urine culture showed 10,000 E. Coli.  Patient received 1 dose of Rocephin  in the hospital, no antibiotics on discharge medications.   Patient states that he has fatigue and feels tired today.  She reports mild dysuria and urinary frequency, no hematuria.  No nausea, vomiting or abdominal pain.  Patient has chronic intermittent diarrhea which has not changed in pattern.  No chest pain, cough, SOB.  Per EMS report, her blood sugar was 500s.  Per ED physician,  patient's daughter told EMS staff that patient was confused with hallucinating today. When I saw pt in ED, she is not confused.  Denies hallucination.  She answered all questions appropriately.  She is alert and oriented x 3.  She moves all extremities normally.  No facial droop or slurred speech.    Data reviewed independently and ED Course: pt was found to have blood sugar 478 byu BMP with normal anion gap 12, beta hydroxybutyric acid is 0.17, WBC 7.0.  Temperature normal, blood pressure 166/78 --> 206/89, heart rate 72, RR 18, oxygen saturation 100% on room air.  VBG with pH 7.46, CO2 39, O2 62.  Pt is placed in PCU for obs.  CT of head: 1. No acute intracranial abnormality. 2. Similar appearance of sellar/suprasellar mass compatible with  known meningioma. 3. 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  EKG: I have personally reviewed.  Sinus rhythm, QTc 445, LAE, anteroseptal infarction pattern   Review of Systems:   General: no fevers, chills, no body weight gain, has fatigue HEENT: no blurry vision, hearing changes or sore throat Respiratory: no dyspnea, coughing, wheezing CV: no chest pain, no palpitations GI: no nausea, vomiting, abdominal pain, constipation. Has chronic intermittent diarrhea GU: has dysuria, increased urinary frequency, no hematuria  Ext: no leg edema Neuro: no unilateral weakness, numbness, or tingling, no vision change or hearing loss Skin: no rash, no skin tear. MSK: No muscle spasm, no deformity, no limitation of range of movement in spin Heme: No easy bruising.  Travel history: No recent long distant travel.   Allergy: Allergies[1]  Past Medical History:  Diagnosis Date   Asthma    Brain tumor (HCC)    Cellulitis 06/2015   rt hand    Depression    History of hiatal hernia    it went away on it's own   Hyperlipemia    Hypertension    Neuropathy     feet & hands    Obesity    Pancreatitis    Type II diabetes mellitus (HCC)    insulin  dependent     Past Surgical History:  Procedure Laterality Date   ABDOMINAL HYSTERECTOMY     BREAST CYST EXCISION Right 10+ yrs ago   abscess removed   CESAREAN SECTION  1981;  1987   CHOLECYSTECTOMY  05/11/2012   Procedure: LAPAROSCOPIC CHOLECYSTECTOMY WITH INTRAOPERATIVE CHOLANGIOGRAM;  Surgeon: Camellia CHRISTELLA Blush, MD,FACS;  Location: [facility] OR;  Service: General;  Laterality: N/A;   SHOULDER ARTHROSCOPY W/ ROTATOR CUFF REPAIR Right    TUBAL LIGATION  10/25/1999   thelbert 01/11/2011    Social History:  reports that she has been smoking cigarettes. She has a 40 pack-year smoking history. She has never used smokeless tobacco. She reports that she does not drink alcohol and does not use  drugs.  Family History:  Family History  Problem Relation Age of Onset   Stroke Mother    Breast cancer Mother 51   Diabetes Father    Diabetes Sister    Breast cancer Maternal Aunt    Breast cancer Maternal Aunt    Breast cancer Maternal Grandmother      Prior to Admission medications  Medication Sig Start Date End Date Taking? Authorizing Provider  Accu-Chek Softclix Lancets lancets Use to check blood sugar 3 times daily. 09/20/24   Jhonny Calvin NOVAK, MD  albuterol  (PROAIR  HFA) 108 (90 Base) MCG/ACT inhaler Inhale 1-2 puffs into the lungs every 6 (six) hours as needed for wheezing or shortness of breath. 09/20/24   Jhonny Calvin NOVAK, MD  aspirin  EC 81 MG tablet Take 1 tablet (81 mg total) by mouth daily. 09/24/23   Fleming, Zelda W, NP  atorvastatin  (LIPITOR) 40 MG tablet Take 1 tablet (40 mg total) by mouth daily at 6 PM. 09/24/23   Fleming, Zelda W, NP  Blood Glucose Monitoring Suppl (ACCU-CHEK GUIDE ME) w/Device KIT Use to check blood glucose in the morning, at noon, and at bedtime. 09/20/24   Jhonny Calvin NOVAK, MD  budesonide -formoterol  (SYMBICORT ) 160-4.5 MCG/ACT inhaler Inhale 2 puffs into the lungs 2 (two) times daily. 07/31/23   Fleming, Zelda W, NP  Continuous Glucose Receiver (FREESTYLE LIBRE 3 READER) DEVI Use to check blood sugars continuously daily. 09/20/24   Jhonny Calvin NOVAK, MD  fluticasone -salmeterol (ADVAIR  HFA) 230-21 MCG/ACT inhaler Inhale 2 puffs into the lungs 2 (two) times daily. 07/31/23   Fleming, Zelda W, NP  gabapentin  (NEURONTIN ) 600 MG tablet Take 1 tablet (600 mg total) by mouth daily as needed (for neuropathy in hands and feet). 09/29/22   Fleming, Zelda W, NP  glucose blood (ACCU-CHEK GUIDE TEST) test strip Use to check blood sugar 3 times daily. 09/20/24   Jhonny Calvin NOVAK, MD  insulin  degludec (TRESIBA  FLEXTOUCH) 200 UNIT/ML FlexTouch Pen Inject 66 Units into the skin daily. 09/20/24   Jhonny Calvin NOVAK, MD  [Paused] lisinopril -hydrochlorothiazide   (ZESTORETIC ) 20-25 MG tablet Take 1 tablet by mouth daily. Patient not taking: Reported on 02/20/2024 Wait to take this until your doctor or other care provider tells you to start again. 09/24/23   Fleming, Zelda W, NP  loperamide  (IMODIUM  A-D) 2 MG tablet Take 2 mg by mouth 4 (four) times daily as needed for diarrhea or loose stools.    [provider]  Misc. Devices MISC Please provide patient with incontinence brief of choice. N39.42 03/18/24   Fleming, Zelda W, NP  omega-3 acid ethyl esters (LOVAZA ) 1 g capsule Take 1 capsule (1 g total) by mouth daily. 09/24/23   Fleming, Zelda W, NP  pantoprazole  (PROTONIX ) 40 MG tablet Take 1 tablet (40 mg total) by mouth daily. 09/24/23   Fleming, Zelda W, NP  [Paused] potassium chloride  SA (KLOR-CON  M) 20 MEQ tablet Take 1 tablet (20 mEq total) by mouth 2 (two) times daily.  Patient not taking: Reported on 02/20/2024 Wait to take this until your doctor or other care provider tells you to start again. 09/24/23   Fleming, Zelda W, NP  Semaglutide , 1 MG/DOSE, 4 MG/3ML SOPN Inject 1 mg into the skin once a week. 09/24/23   Fleming, Zelda W, NP  sertraline  (ZOLOFT ) 100 MG tablet Take 1 tablet (100 mg total) by mouth daily FOR DEPRESSION. 09/24/23   Fleming, Zelda W, NP  trazodone  (DESYREL ) 300 MG tablet Take 1 tablet (300 mg total) by mouth at bedtime. 09/01/23   Theotis Haze ORN, NP  insulin  NPH Human (HUMULIN  N) 100 UNIT/ML injection Inject 50 Units into the skin 2 times daily with meals for 30 days. (discard after 31 days) 09/23/21 10/06/21      Physical Exam: Vitals:   09/21/24 2218 09/21/24 2233 09/21/24 2245 09/21/24 2339  BP: (!) 211/118 (!) 188/90 (!) 167/90 (!) 162/95  Pulse:   77   Resp:      Temp:      TempSrc:      SpO2:   98%    General: Not in acute distress.  Dry mucous membrane HEENT:       Eyes: PERRL, EOMI, no jaundice       ENT: No discharge from the ears and nose, no pharynx injection, no tonsillar enlargement.        Neck: No JVD, no  bruit, no mass felt. Heme: No neck lymph node enlargement. Cardiac: S1/S2, RRR, No murmurs, No gallops or rubs. Respiratory: No rales, wheezing, rhonchi or rubs. GI: Soft, nondistended, nontender, no rebound pain, no organomegaly, BS present. GU: No hematuria Ext: No pitting leg edema bilaterally. 1+DP/PT pulse bilaterally. Musculoskeletal: No joint deformities, No joint redness or warmth, no limitation of ROM in spin. Skin: No rashes.  Neuro: Alert, oriented X3, cranial nerves II-XII grossly intact, moves all extremities normally.   Labs on Admission: I have personally reviewed following labs and imaging studies  CBC: Recent Labs  Lab 09/19/24 0017 09/19/24 0358 09/21/24 2012  WBC 6.6 7.8 7.0  NEUTROABS 4.2  --  4.5  HGB 13.2 13.3 13.2  HCT 38.5 39.1 38.5  MCV 84.8 84.6 83.9  PLT 323 327 312   Basic Metabolic Panel: Recent Labs  Lab 09/19/24 0017 09/19/24 0358 09/21/24 2012  NA 137 142 138  K 3.7 3.2* 4.0  CL 99 103 100  CO2 27 27 26   GLUCOSE 602* 342* 478*  BUN 17 16 13   CREATININE 1.30* 1.13* 1.00  CALCIUM  9.8 9.9 9.7   GFR: Estimated Creatinine Clearance: 63 mL/min (by C-G formula based on SCr of 1 mg/dL). Liver Function Tests: Recent Labs  Lab 09/19/24 0017 09/21/24 2012  AST 17 23  ALT 8 11  ALKPHOS 109 113  BILITOT 0.5 0.5  PROT 7.1 7.4  ALBUMIN 3.8 4.1   No results for input(s): LIPASE, AMYLASE in the last 168 hours. No results for input(s): AMMONIA in the last 168 hours. Coagulation Profile: No results for input(s): INR, PROTIME in the last 168 hours. Cardiac Enzymes: No results for input(s): CKTOTAL, CKMB, CKMBINDEX, TROPONINI in the last 168 hours. BNP (last 3 results) No results for input(s): PROBNP in the last 8760 hours. HbA1C: Recent Labs    09/19/24 0358  HGBA1C 12.2*   CBG: Recent Labs  Lab 09/20/24 0833 09/20/24 1222 09/21/24 2036 09/21/24 2146 09/21/24 2229  GLUCAP 88 143* 437* 393* 404*   Lipid  Profile: No results for input(s): CHOL, HDL, LDLCALC, TRIG,  CHOLHDL, LDLDIRECT in the last 72 hours. Thyroid  Function Tests: No results for input(s): TSH, T4TOTAL, FREET4, T3FREE, THYROIDAB in the last 72 hours. Anemia Panel: No results for input(s): VITAMINB12, FOLATE, FERRITIN, TIBC, IRON, RETICCTPCT in the last 72 hours. Urine analysis:    Component Value Date/Time   COLORURINE YELLOW (A) 09/19/2024 0017   APPEARANCEUR CLOUDY (A) 09/19/2024 0017   APPEARANCEUR Cloudy (A) 07/31/2023 1705   LABSPEC 1.025 09/19/2024 0017   PHURINE 6.0 09/19/2024 0017   GLUCOSEU >=500 (A) 09/19/2024 0017   HGBUR SMALL (A) 09/19/2024 0017   BILIRUBINUR NEGATIVE 09/19/2024 0017   BILIRUBINUR Negative 07/31/2023 1705   KETONESUR NEGATIVE 09/19/2024 0017   PROTEINUR 100 (A) 09/19/2024 0017   UROBILINOGEN 1.0 12/29/2022 1226   UROBILINOGEN 0.2 01/15/2016 1524   NITRITE NEGATIVE 09/19/2024 0017   LEUKOCYTESUR LARGE (A) 09/19/2024 0017   Sepsis Labs: @LABRCNTIP (procalcitonin:4,lacticidven:4) ) Recent Results (from the past 240 hours)  Urine Culture     Status: Abnormal (Preliminary result)   Collection Time: 09/19/24 12:17 AM   Specimen: Urine, Random  Result Value Ref Range Status   Specimen Description   Final    URINE, RANDOM Performed at [facility], [address], [place], [place] [address]    Special Requests   Final    NONE Reflexed from [id number] Performed at [facility], [address] [place], [place] [address]    Culture (A)  Final    10,000 COLONIES/mL ESCHERICHIA COLI CULTURE REINCUBATED FOR BETTER GROWTH SUSCEPTIBILITIES TO FOLLOW Performed at [facility], [address], [place], [place] [address]    Report Status PENDING  Incomplete     Radiological Exams on Admission:   Assessment/Plan Principal Problem:   Hypertensive urgency Active Problems:   UTI (urinary tract infection)   Uncontrolled type 2 diabetes  mellitus with hyperglycemia (HCC)   HLD (hyperlipidemia)   Asthma, chronic   Anxiety and depression   Overweight (BMI 25.0-29.9)   Assessment and Plan:  Hypertensive urgency: Bp is up to 206/28.  At the recent discharge, her Prinzide  was on hold, which is like the the reason.  -Please stay in PCU for outpatient - Start lisinopril  20 mg - Will not start HCTZ since patient need IV fluid due to hypoglycemia - IV hydralazine  as needed 10 mg q2h for SBP> 165.  UTI (urinary tract infection): Patient had positive urinalysis and urine culture with 10,000 E. coli in his recent admission.  Patient received 1 dose of Rocephin , sounds like not completely treated.  Patient still has symptoms of UTI, including dysuria and urinary frequency. - Started Rocephin  IV - Repeat UA and urine culture  Uncontrolled type 2 diabetes mellitus with hyperglycemia (HCC): Blood sugar 478 with normal anion gap 12, pH 7.46 by VBG, no DKA.  Patient states she is taking insulin , but cannot tell me the name and the dosage of insulin .  Likely noncompliance.  Recent A1c 12.2.  Patient is supposed to take Ozempic  and Tresiba  66 units daily. - Glargine insulin  56 units daily - SSI - IV fluid: 2 L normal saline, Naima 75 cc/h  HLD (hyperlipidemia) -Lipitor  Asthma, chronic: Stable, no wheezing -Bronchodilators and prn Mucinex   Anxiety and depression -Zoloft  and trazodone   Overweight (BMI 25.0-29.9): Body weight 78.1 kg, BMI 29.55 - Encourage losing weight - Exercise healthy diet       DVT ppx:  SQ Lovenox   Code Status: Full code   Family Communication:     not done, no family  member is at bed side.       Disposition Plan:  Anticipate discharge back to previous environment  Consults called:  none  Admission status and Level of care: Progressive:    for obs     Dispo: The patient is from: Home              Anticipated d/c is to: Home              Anticipated d/c date is: 1 day              Patient  currently is not medically stable to d/c.    Severity of Illness:  The appropriate patient status for this patient is OBSERVATION. Observation status is judged to be reasonable and necessary in order to provide the required intensity of service to ensure the patient's safety. The patient's presenting symptoms, physical exam findings, and initial radiographic and laboratory data in the context of their medical condition is felt to place them at decreased risk for further clinical deterioration. Furthermore, it is anticipated that the patient will be medically stable for discharge from the hospital within 2 midnights of admission.        Date of Service 09/21/2024    Caleb Exon [facility]   If 7PM-7AM, please contact night-coverage [email or web address] 09/21/2024, 11:45 PM     [1]  Allergies Allergen Reactions   Penicillins Hives and Shortness Of Breath   Azithromycin Hives   Erythromycin Base Hives   Sulfa Antibiotics Hives   Tramadol  Hives   "

## 2024-09-21 NOTE — ED Provider Notes (Signed)
 "  [facility] Provider Note    Event Date/Time   First MD Initiated Contact with Patient 09/21/24 1949     (approximate)   History   Hyperglycemia   HPI  Barbara Wang is a 59 y.o. female with history of brain tumor, hypertension, hyperlipidemia, insulin -dependent type 2 diabetes who presents to the emergency department EMS for hyperglycemia.  Blood sugar was in the 500s with EMS.  Patient denies any pain, vomiting.  States she ran out of NovoLog  on Monday.  It looks like she was just discharged from the hospital yesterday for the same complaint and insulin  was sent to her outpatient pharmacy.  Patient did not pick it up.  Patient tells me that she is on a sliding scale of NovoLog  and no long-acting insulin  however per her discharge summary it looks like she is on 66 units of Tresiba  daily.  Patient also tells me that she takes an oral medication that starts with a G for diabetes but I do not see this listed in her medication list.  Daughter told EMS staff that patient was confused, hallucinating today.  Patient is currently alert and oriented x 3.   History provided by patient, EMS.    Past Medical History:  Diagnosis Date   Asthma    Brain tumor (HCC)    Cellulitis 06/2015   rt hand    Depression    History of hiatal hernia    it went away on it's own   Hyperlipemia    Hypertension    Neuropathy     feet & hands    Obesity    Pancreatitis    Type II diabetes mellitus (HCC)    insulin  dependent     Past Surgical History:  Procedure Laterality Date   ABDOMINAL HYSTERECTOMY     BREAST CYST EXCISION Right 10+ yrs ago   abscess removed   CESAREAN SECTION  1981; 1987   CHOLECYSTECTOMY  05/11/2012   Procedure: LAPAROSCOPIC CHOLECYSTECTOMY WITH INTRAOPERATIVE CHOLANGIOGRAM;  Surgeon: Camellia CHRISTELLA Blush, MD,FACS;  Location: [facility] OR;  Service: General;  Laterality: N/A;   SHOULDER ARTHROSCOPY W/ ROTATOR CUFF REPAIR Right    TUBAL LIGATION   10/25/1999   thelbert 01/11/2011    MEDICATIONS:  Prior to Admission medications  Medication Sig Start Date End Date Taking? Authorizing Provider  Accu-Chek Softclix Lancets lancets Use to check blood sugar 3 times daily. 09/20/24   Jhonny Calvin NOVAK, MD  albuterol  (PROAIR  HFA) 108 (90 Base) MCG/ACT inhaler Inhale 1-2 puffs into the lungs every 6 (six) hours as needed for wheezing or shortness of breath. 09/20/24   Jhonny Calvin NOVAK, MD  aspirin  EC 81 MG tablet Take 1 tablet (81 mg total) by mouth daily. 09/24/23   Fleming, Zelda W, NP  atorvastatin  (LIPITOR) 40 MG tablet Take 1 tablet (40 mg total) by mouth daily at 6 PM. 09/24/23   Fleming, Zelda W, NP  Blood Glucose Monitoring Suppl (ACCU-CHEK GUIDE ME) w/Device KIT Use to check blood glucose in the morning, at noon, and at bedtime. 09/20/24   Jhonny Calvin NOVAK, MD  budesonide -formoterol  (SYMBICORT ) 160-4.5 MCG/ACT inhaler Inhale 2 puffs into the lungs 2 (two) times daily. 07/31/23   Fleming, Zelda W, NP  Continuous Glucose Receiver (FREESTYLE LIBRE 3 READER) DEVI Use to check blood sugars continuously daily. 09/20/24   Jhonny Calvin NOVAK, MD  fluticasone -salmeterol (ADVAIR  HFA) 230-21 MCG/ACT inhaler Inhale 2 puffs into the lungs 2 (two) times daily. 07/31/23   Theotis,  Zelda W, NP  gabapentin  (NEURONTIN ) 600 MG tablet Take 1 tablet (600 mg total) by mouth daily as needed (for neuropathy in hands and feet). 09/29/22   Fleming, Zelda W, NP  glucose blood (ACCU-CHEK GUIDE TEST) test strip Use to check blood sugar 3 times daily. 09/20/24   Jhonny Calvin NOVAK, MD  insulin  degludec (TRESIBA  FLEXTOUCH) 200 UNIT/ML FlexTouch Pen Inject 66 Units into the skin daily. 09/20/24   Jhonny Calvin NOVAK, MD  Paused lisinopril -hydrochlorothiazide  (ZESTORETIC ) 20-25 MG tablet Take 1 tablet by mouth daily. Patient not taking: Reported on 02/20/2024 Wait to take this until your doctor or other care provider tells you to start again. 09/24/23   Fleming, Zelda W, NP   loperamide  (IMODIUM  A-D) 2 MG tablet Take 2 mg by mouth 4 (four) times daily as needed for diarrhea or loose stools.    [provider]  Misc. Devices MISC Please provide patient with incontinence brief of choice. N39.42 03/18/24   Fleming, Zelda W, NP  omega-3 acid ethyl esters (LOVAZA ) 1 g capsule Take 1 capsule (1 g total) by mouth daily. 09/24/23   Fleming, Zelda W, NP  pantoprazole  (PROTONIX ) 40 MG tablet Take 1 tablet (40 mg total) by mouth daily. 09/24/23   Fleming, Zelda W, NP  Paused potassium chloride  SA (KLOR-CON  M) 20 MEQ tablet Take 1 tablet (20 mEq total) by mouth 2 (two) times daily. Patient not taking: Reported on 02/20/2024 Wait to take this until your doctor or other care provider tells you to start again. 09/24/23   Theotis Haze ORN, NP  Semaglutide , 1 MG/DOSE, 4 MG/3ML SOPN Inject 1 mg into the skin once a week. 09/24/23   Fleming, Zelda W, NP  sertraline  (ZOLOFT ) 100 MG tablet Take 1 tablet (100 mg total) by mouth daily FOR DEPRESSION. 09/24/23   Fleming, Zelda W, NP  trazodone  (DESYREL ) 300 MG tablet Take 1 tablet (300 mg total) by mouth at bedtime. 09/01/23   Theotis Haze ORN, NP  insulin  NPH Human (HUMULIN  N) 100 UNIT/ML injection Inject 50 Units into the skin 2 times daily with meals for 30 days. (discard after 31 days) 09/23/21 10/06/21      Physical Exam   Triage Vital Signs: ED Triage Vitals  Encounter Vitals Group     BP 09/21/24 2016 (!) 166/78     Girls Systolic BP Percentile --      Girls Diastolic BP Percentile --      Boys Systolic BP Percentile --      Boys Diastolic BP Percentile --      Pulse Rate 09/21/24 2037 72     Resp 09/21/24 2037 18     Temp 09/21/24 2037 98.2 F (36.8 C)     Temp Source 09/21/24 2037 Oral     SpO2 09/21/24 2016 100 %     Weight --      Height --      Head Circumference --      Peak Flow --      Pain Score 09/21/24 1954 0     Pain Loc --      Pain Education --      Exclude from Growth Chart --      Most recent vital  signs: Vitals:   09/21/24 2016 09/21/24 2037  BP: (!) 166/78   Pulse:  72  Resp:  18  Temp:  98.2 F (36.8 C)  SpO2: 100%     CONSTITUTIONAL: Alert, responds appropriately to questions.  Chronically ill-appearing, no  distress HEAD: Normocephalic, atraumatic EYES: Conjunctivae clear, pupils appear equal, sclera nonicteric ENT: normal nose; moist mucous membranes NECK: Supple, normal ROM CARD: RRR; S1 and S2 appreciated RESP: Normal chest excursion without splinting or tachypnea; breath sounds clear and equal bilaterally; no wheezes, no rhonchi, no rales, no hypoxia or respiratory distress, speaking full sentences ABD/GI: Non-distended; soft, non-tender, no rebound, no guarding, no peritoneal signs BACK: The back appears normal EXT: Normal ROM in all joints; no deformity noted, no edema SKIN: Normal color for age and race; warm; no rash on exposed skin NEURO: Moves all extremities equally, normal speech, no facial asymmetry, ambulates with normal gait PSYCH: The patient's mood and manner are appropriate.  Not responding to internal stimuli.   ED Results / Procedures / Treatments   LABS: (all labs ordered are listed, but only abnormal results are displayed) Labs Reviewed  COMPREHENSIVE METABOLIC PANEL WITH GFR - Abnormal; Notable for the following components:      Result Value   Glucose, Bld 478 (*)    All other components within normal limits  OSMOLALITY - Abnormal; Notable for the following components:   Osmolality 316 (*)    All other components within normal limits  BLOOD GAS, VENOUS - Abnormal; Notable for the following components:   pH, Ven 7.46 (*)    pCO2, Ven 39 (*)    pO2, Ven 62 (*)    Acid-Base Excess 3.7 (*)    All other components within normal limits  CBG MONITORING, ED - Abnormal; Notable for the following components:   Glucose-Capillary 437 (*)    All other components within normal limits  CBG MONITORING, ED - Abnormal; Notable for the following  components:   Glucose-Capillary 393 (*)    All other components within normal limits  URINE CULTURE  CBC WITH DIFFERENTIAL/PLATELET  BETA-HYDROXYBUTYRIC ACID  ETHANOL  URINALYSIS, ROUTINE W REFLEX MICROSCOPIC  URINE DRUG SCREEN     EKG:  EKG Interpretation Date/Time:  Sunday September 21 2024 20:28:48 EST Ventricular Rate:  70 PR Interval:  145 QRS Duration:  100 QT Interval:  412 QTC Calculation: 445 R Axis:   12  Text Interpretation: Sinus rhythm Anteroseptal infarct, old Confirmed by Neomi Neptune [id number]) on 09/21/2024 8:32:25 PM         RADIOLOGY: My personal review and interpretation of imaging: CT head unremarkable.  I have personally reviewed all radiology reports.   CT HEAD WO CONTRAST ( ) Result Date: 09/21/2024 EXAM: CT HEAD WITHOUT CONTRAST 09/21/2024 08:18:10 PM TECHNIQUE: CT of the head was performed without the administration of intravenous contrast. Automated exposure control, iterative reconstruction, and/or weight based adjustment of the mA/kV was utilized to reduce the radiation dose to as low as reasonably achievable. COMPARISON: 02/14/2024 CLINICAL HISTORY: Altered mental status, hallucinations. FINDINGS: BRAIN AND VENTRICLES: No acute hemorrhage. No evidence of acute infarct. 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 Similar mass effect on the anterior aspect of the third ventricle. The ventricles are stable in size and configuration compared to prior. 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 Small remote infarcts in the bilateral cerebellum. Patchy periventricular white matter hypodensity consistent with chronic microvascular ischemic changes. No hydrocephalus. No extra-axial collection. No midline shift. ORBITS: No acute abnormality. SINUSES: No acute abnormality. SOFT TISSUES AND SKULL: No acute soft tissue abnormality. No skull fracture.  IMPRESSION: 1. No acute intracranial abnormality. 2. Similar appearance of sellar/suprasellar mass compatible with known meningioma.  3. 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 Electronically signed by: Donnice Mania MD 09/21/2024 08:36 PM EST RP Workstation: [id number]     PROCEDURES:  Critical Care performed: Yes, see critical care procedure note(s)   CRITICAL CARE Performed by: Josette Sink   Total critical care time: 30 minutes  Critical care time was exclusive of separately billable procedures and treating other patients.  Critical care was necessary to treat or prevent imminent or life-threatening deterioration.  Critical care was time spent personally by me on the following activities: development of treatment plan with patient and/or surrogate as well as nursing, discussions with consultants, evaluation of patient's response to treatment, examination of patient, obtaining history from patient or surrogate, ordering and performing treatments and interventions, ordering and review of laboratory studies, ordering and review of radiographic studies, pulse oximetry and re-evaluation of patient's condition.   Procedures    IMPRESSION / MDM / ASSESSMENT AND PLAN / ED COURSE  I reviewed the triage vital signs and the nursing notes.    Patient here for altered mental status, hallucinations, hyperglycemia.  The patient is on the cardiac monitor to evaluate for evidence of arrhythmia and/or significant heart rate changes.   DIFFERENTIAL DIAGNOSIS (includes but not limited to):   Hyperglycemia due to medication noncompliance, DKA, HHS, UTI, dehydration, intracranial hemorrhage, stroke, anemia, electrolyte derangement   Patient's presentation is most consistent with acute presentation with potential threat to life or bodily function.   PLAN: Will obtain labs, blood gas, urine, CT head, EKG.  Will give IV fluids.  Daughter  reports she was altered and hallucinating.  She is alert and oriented here and appears neuro intact but is a very poor historian.  No family at bedside.   MEDICATIONS GIVEN IN ED: Medications  cefTRIAXone  (ROCEPHIN ) 2 g in sodium chloride  0.9 % 100 mL IVPB (has no administration in time range)  sodium chloride  0.9 % bolus 1,000 mL (0 mLs Intravenous Stopped 09/21/24 2109)  insulin  aspart (novoLOG ) injection 5 Units (5 Units Intravenous Given 09/21/24 2105)     ED COURSE: Patient has blood glucose in the 400s here but normal pH, bicarb and anion gap.  Beta hydroxybutyric acid level is normal.  Serum osmolality is slightly elevated.  This could be early HHS given her altered mental status but it does appear she was being treated for a UTI which also could be contributing to her altered mental status.  She is definitely confused here, picking at the cords, abnormal behavior.  We have tried to get a urine sample from her several times and she continues to dump it out or put toilet tissue in the urine.  Will give Rocephin  given she was just discharged on antibiotics for UTI and likely did not pick them up.  Will discuss with hospitalist for readmission.  CT head reviewed and interpreted by myself and the radiologist and is unremarkable. CONSULTS:  Consulted and discussed patient's case with hospitalist, Dr. Hilma.  I have recommended admission and consulting physician agrees and will place admission orders.  Patient (and family if present) agree with this plan.   I reviewed all nursing notes, vitals, pertinent previous records.  All labs, EKGs, imaging ordered have been independently reviewed and interpreted by myself.    OUTSIDE RECORDS REVIEWED: Reviewed recent admission.       FINAL CLINICAL IMPRESSION(S) / ED DIAGNOSES   Final diagnoses:  Hyperglycemia  Altered mental status, unspecified altered mental status type  Rx / DC Orders   ED Discharge Orders     None        Note:   This document was prepared using Dragon voice recognition software and may include unintentional dictation errors.   Safiatou Islam, Josette SAILOR, DO 09/21/24 2210  "

## 2024-09-21 NOTE — ED Notes (Signed)
 Attempted 3x to collect urine from pt. PT states she usually doesn't pee a lot at home. PT educated she may need to be catheterized.

## 2024-09-22 DIAGNOSIS — I160 Hypertensive urgency: Secondary | ICD-10-CM | POA: Diagnosis not present

## 2024-09-22 LAB — CBG MONITORING, ED
Glucose-Capillary: 94 mg/dL (ref 70–99)
Glucose-Capillary: 107 mg/dL — ABNORMAL HIGH (ref 70–99)
Glucose-Capillary: 59 mg/dL — ABNORMAL LOW (ref 70–99)
Glucose-Capillary: 146 mg/dL — ABNORMAL HIGH (ref 70–99)
Glucose-Capillary: 149 mg/dL — ABNORMAL HIGH (ref 70–99)
Glucose-Capillary: 155 mg/dL — ABNORMAL HIGH (ref 70–99)

## 2024-09-22 LAB — CBC
HCT: 37.3 % (ref 36.0–46.0)
Hemoglobin: 12.5 g/dL (ref 12.0–15.0)
MCH: 28.2 pg (ref 26.0–34.0)
MCHC: 33.5 g/dL (ref 30.0–36.0)
MCV: 84 fL (ref 80.0–100.0)
Platelets: 335 10*3/uL (ref 150–400)
RBC: 4.44 MIL/uL (ref 3.87–5.11)
RDW: 12.7 % (ref 11.5–15.5)
WBC: 8.1 10*3/uL (ref 4.0–10.5)
nRBC: 0 % (ref 0.0–0.2)

## 2024-09-22 LAB — BASIC METABOLIC PANEL WITH GFR
Anion gap: 9 (ref 5–15)
BUN: 12 mg/dL (ref 6–20)
CO2: 27 mmol/L (ref 22–32)
Calcium: 9.3 mg/dL (ref 8.9–10.3)
Chloride: 110 mmol/L (ref 98–111)
Creatinine, Ser: 0.79 mg/dL (ref 0.44–1.00)
GFR, Estimated: >60 mL/min
Glucose, Bld: 54 mg/dL — ABNORMAL LOW (ref 70–99)
Potassium: 3 mmol/L — ABNORMAL LOW (ref 3.5–5.1)
Sodium: 146 mmol/L — ABNORMAL HIGH (ref 135–145)

## 2024-09-22 LAB — MAGNESIUM: Magnesium: 1.7 mg/dL (ref 1.7–2.4)

## 2024-09-22 MED ORDER — MELATONIN 5 MG PO TABS
2.5000 mg | ORAL_TABLET | Freq: Every day | ORAL | Status: DC
  Administered 2024-09-22 – 2024-09-25 (×4): 2.5 mg via ORAL
  Filled 2024-09-22 (×4): qty 1

## 2024-09-22 MED ORDER — INSULIN ASPART 100 UNIT/ML IJ SOLN
0.0000 [IU] | Freq: Three times a day (TID) | INTRAMUSCULAR | Status: DC
  Administered 2024-09-22 – 2024-09-23 (×2): 1 [IU] via SUBCUTANEOUS
  Administered 2024-09-23 (×2): 2 [IU] via SUBCUTANEOUS
  Administered 2024-09-24: 3 [IU] via SUBCUTANEOUS
  Administered 2024-09-24 (×2): 2 [IU] via SUBCUTANEOUS
  Administered 2024-09-25: 3 [IU] via SUBCUTANEOUS
  Administered 2024-09-25: 7 [IU] via SUBCUTANEOUS
  Administered 2024-09-25 – 2024-09-26 (×2): 5 [IU] via SUBCUTANEOUS
  Administered 2024-09-26: 7 [IU] via SUBCUTANEOUS
  Administered 2024-09-26: 5 [IU] via SUBCUTANEOUS
  Filled 2024-09-22: qty 3
  Filled 2024-09-22: qty 2
  Filled 2024-09-22: qty 1
  Filled 2024-09-22: qty 5
  Filled 2024-09-22: qty 2
  Filled 2024-09-22: qty 3
  Filled 2024-09-22: qty 1
  Filled 2024-09-22: qty 5
  Filled 2024-09-22: qty 2
  Filled 2024-09-22: qty 5
  Filled 2024-09-22: qty 7
  Filled 2024-09-22: qty 2
  Filled 2024-09-22: qty 3

## 2024-09-22 MED ORDER — INSULIN GLARGINE 100 UNIT/ML ~~LOC~~ SOLN
40.0000 [IU] | Freq: Every day | SUBCUTANEOUS | Status: DC
  Filled 2024-09-22 (×2): qty 0.4

## 2024-09-22 MED ORDER — INSULIN ASPART 100 UNIT/ML IJ SOLN: 0.0000 [IU] | Freq: Three times a day (TID) | INTRAMUSCULAR | Status: DC

## 2024-09-22 MED ORDER — MAGNESIUM SULFATE IN D5W 1-5 GM/100ML-% IV SOLN
1.0000 g | Freq: Once | INTRAVENOUS | Status: AC
  Administered 2024-09-22: 1 g via INTRAVENOUS
  Filled 2024-09-22: qty 100

## 2024-09-22 MED ORDER — INSULIN ASPART 100 UNIT/ML IJ SOLN
0.0000 [IU] | Freq: Every day | INTRAMUSCULAR | Status: DC
  Administered 2024-09-24 – 2024-09-25 (×2): 3 [IU] via SUBCUTANEOUS
  Filled 2024-09-22 (×2): qty 3

## 2024-09-22 MED ORDER — POTASSIUM CHLORIDE CRYS ER 20 MEQ PO TBCR
40.0000 meq | EXTENDED_RELEASE_TABLET | Freq: Two times a day (BID) | ORAL | Status: AC
  Administered 2024-09-22 (×2): 40 meq via ORAL
  Filled 2024-09-22 (×2): qty 2

## 2024-09-22 NOTE — ED Notes (Signed)
 pt states she takes the Tresiba  daily in the AM without meal. Denies taking Humalon 10 tid bc she ran out around Monday 09/15/24. She states she took off the Jackson sensor more than a week ago but after [employer]

## 2024-09-22 NOTE — Inpatient Diabetes Management (Addendum)
 Inpatient Diabetes Program Recommendations  AACE/ADA: New Consensus Statement on Inpatient Glycemic Control (2015)  Target Ranges:  Prepandial:   less than 140 mg/dL      Peak postprandial:   less than 180 mg/dL (1-2 hours)      Critically ill patients:  140 - 180 mg/dL   Lab Results  Component Value Date   GLUCAP 107 (H) 09/22/2024   HGBA1C 12.2 (H) 09/19/2024    Latest Reference Range & Units 09/21/24 20:36 09/21/24 21:46 09/21/24 22:29  Glucose-Capillary 70 - 99 mg/dL 562 (H) 606 (H) 595 (H)  (H): Data is abnormally high  Latest Reference Range & Units 09/22/24 07:09 09/22/24 07:42  Glucose-Capillary 70 - 99 mg/dL 94 892 (H)  (H): Data is abnormally high  Diabetes history: DM2 Outpatient Diabetes medications: Tresiba  65 units daily Humalog  10 units TID with meals Ozempic  1 mg Qweek FSL3 CGM Current orders for Inpatient glycemic control: Lantus  56 units daily Novolog  0-15 units tid, 0-5 units hs correction  Inpatient Diabetes Program Recommendations:   Patient was recently discharged on 09-20-24 with DKA (ran out of insulin ). Received Novolog  5 units insulin  21:05, 10 units @ 22;32 and Lantus  56 units. Please consider: -Decrease Novolog  correction to 0-9 units tid, 0-5 units hs -Add Novolog  5 units tid meal coverage when PP CBGs >180   Spoke with patient via phone (DM coordinator working remotely). Patient said she was taking her insulin  but she was unclear that she was taking her Tresiba . Patient says she ran out of ozempic  2 weeks ago and has not restarted.   Thank you, Tysean Vandervliet E. Carlea Badour, RN, MSN, CNS, CDCES  Diabetes Coordinator Inpatient Glycemic Control Team Team Pager [phone] (8am-5pm) 09/22/2024 8:52 AM

## 2024-09-22 NOTE — Progress Notes (Addendum)
 " PROGRESS NOTE    Barbara Wang  FMW:[id number] DOB: Dec 05, 1965 DOA: 09/21/2024 PCP: Theotis Haze ORN, NP  Subjective:  No acute events overnight. Seen and examined at bedside. Reports she is here again because of fall and unsure who told other providers she came back for dysruia and urgency. Reports she was taking her insulin  at home but can't recall the name and dosing of her regimen. Reports her daughter helps her with medications. As per daughters, patient was confused at home with visual hallucinations and hyperglycemia in 500s and that's why EMS was called. Denies nausea, vomiting, constipation.   Hospital Course:  59 y.o. female with medical history significant of poorly controlled diabetes, HTN, HLD, asthma, depression with anxiety, brain tumor, neuropathy, chronic diarrhea, meningioma, who presents with weakness, urinary frequency and dysuria   Patient was recently hospitalized from 1/23 - 1/24 due to uncontrolled diabetes with glycemia.  Patient was discharged on Lantus  66 units daily.  Patient was also found to have UTI.  Urine culture showed 10,000 E. Coli.  Patient received 1 dose of Rocephin  in the hospital, no antibiotics on discharge medications.    Patient states that he has fatigue and feels tired today.  She reports mild dysuria and urinary frequency, no hematuria.  No nausea, vomiting or abdominal pain.  Patient has chronic intermittent diarrhea which has not changed in pattern.  No chest pain, cough, SOB.  Per EMS report, her blood sugar was 500s.   Per ED physician,  patient's daughter told EMS staff that patient was confused with hallucinating today. When I saw pt in ED, she is not confused.  Denies hallucination.  She answered all questions appropriately.  She is alert and oriented x 3.  She moves all extremities normally.  No facial droop or slurred speech.    Assessment and Plan:  Acute encephalopathy UTI (urinary tract infection):  Patient and daughter deny  her having any urinary symptoms and that being the reason for her to come back to hospital which is contrary to what is documented by the ED and admitting providers. Unsure if patient is a reliable historian at all at this time Unknown baseline mentation. Advised daughter to come to hospital to see the patient and let the treatment team know whether she is or isn't at/near her baseline mentation As per daughter, patient was confused and having visual  hallucinations at home where have not been observed at the hospital since arrival Etiology unclear, possibilities include hyperglycemia, hypertensive urgency, UTI CT head unremarkable No overt metabolic derangements Patient had positive urinalysis and urine culture with 10,000 E. coli in his recent admission. UA concerning for UTI Patient received 2 doses of Rocephin  on 1/22 and 1/23 before discharge - Cont Rocephin  IV - follow urine cultures - monitor oral intake, I/Os, Bms, sleep routine - monitor on fall and delirium precautions   Hypoglycemia Uncontrolled type 2 diabetes mellitus with hyperglycemia (HCC):  No concern for DKA/HHS on admission  Patient states she is taking insulin , but does not know her diabetic medications at all.  Concern for medication noncompliance, although, daughter states she has been preparing the meds as prescribed for her mother to take at home in front of her.  Recent A1c 12.2.  - Patient is supposed to take Ozempic  and Tresiba  66 units daily at home. - seen by diabetes coordinator - decrease Glargine insulin  56 to 40 units daily - decrease SSI intensity with FS ACHS - stop IV fluids  - cont diabetic diet -  adjust insulin  as needed  Hypokalemia - monitor and replete electrolytes as needed  Hypertensive urgency:  BP better  At the recent discharge, her Prinzide  was held - Start lisinopril  20 mg - Hold HCTZ for now. Resume if BP remains elevated  - IV hydralazine  as needed 10 mg q2h for SBP> 165.   HLD  (hyperlipidemia) -Lipitor   Asthma, chronic: Stable, no wheezing -Bronchodilators and prn Mucinex    Anxiety and depression -Zoloft  and trazodone    Overweight (BMI 25.0-29.9): Body weight 78.1 kg, BMI 29.55 - Encourage losing weight - Exercise healthy diet  DVT prophylaxis: enoxaparin  (LOVENOX ) injection 40 mg Start: 09/22/24 1000  Lovenox    Code Status: Full Code Family Communication: updated daughters over the phone Disposition Plan: TBD Reason for continuing need for hospitalization: severity of illness  Objective: Vitals:   09/22/24 0600 09/22/24 0730 09/22/24 0800 09/22/24 0830  BP: (!) 140/74 (!) 150/74 (!) 150/81 134/82  Pulse: 78  80 74  Resp: 18  17 20   Temp:      TempSrc:      SpO2: 98%  98% 98%    Intake/Output Summary (Last 24 hours) at 09/22/2024 1052 Last data filed at 09/21/2024 2244 Gross per 24 hour  Intake 1100 ml  Output --  Net 1100 ml   There were no vitals filed for this visit.  Examination:  Physical Exam Vitals and nursing note reviewed.  Constitutional:      General: She is not in acute distress.    Appearance: She is ill-appearing.  HENT:     Head: Normocephalic and atraumatic.  Cardiovascular:     Rate and Rhythm: Normal rate and regular rhythm.     Pulses: Normal pulses.     Heart sounds: Normal heart sounds.  Pulmonary:     Effort: Pulmonary effort is normal.     Breath sounds: Normal breath sounds.  Abdominal:     General: Bowel sounds are normal.     Palpations: Abdomen is soft.  Neurological:     Mental Status: She is alert.     Data Reviewed: I have personally reviewed following labs and imaging studies  CBC: Recent Labs  Lab 09/19/24 0017 09/19/24 0358 09/21/24 2012 09/22/24 0400  WBC 6.6 7.8 7.0 8.1  NEUTROABS 4.2  --  4.5  --   HGB 13.2 13.3 13.2 12.5  HCT 38.5 39.1 38.5 37.3  MCV 84.8 84.6 83.9 84.0  PLT 323 327 312 335   Basic Metabolic Panel: Recent Labs  Lab 09/19/24 0017 09/19/24 0358  09/21/24 2012 09/22/24 0400  NA 137 142 138 146*  K 3.7 3.2* 4.0 3.0*  CL 99 103 100 110  CO2 27 27 26 27   GLUCOSE 602* 342* 478* 54*  BUN 17 16 13 12   CREATININE 1.30* 1.13* 1.00 0.79  CALCIUM  9.8 9.9 9.7 9.3  MG  --   --   --  1.7   GFR: Estimated Creatinine Clearance: 78.7 mL/min (by C-G formula based on SCr of 0.79 mg/dL). Liver Function Tests: Recent Labs  Lab 09/19/24 0017 09/21/24 2012  AST 17 23  ALT 8 11  ALKPHOS 109 113  BILITOT 0.5 0.5  PROT 7.1 7.4  ALBUMIN 3.8 4.1   No results for input(s): LIPASE, AMYLASE in the last 168 hours. No results for input(s): AMMONIA in the last 168 hours. Coagulation Profile: No results for input(s): INR, PROTIME in the last 168 hours. Cardiac Enzymes: No results for input(s): CKTOTAL, CKMB, CKMBINDEX, TROPONINI in the last  168 hours. ProBNP, BNP (last 5 results) No results for input(s): PROBNP, BNP in the last 8760 hours. HbA1C: No results for input(s): HGBA1C in the last 72 hours. CBG: Recent Labs  Lab 09/21/24 2036 09/21/24 2146 09/21/24 2229 09/22/24 0709 09/22/24 0742  GLUCAP 437* 393* 404* 94 107*   Lipid Profile: No results for input(s): CHOL, HDL, LDLCALC, TRIG, CHOLHDL, LDLDIRECT in the last 72 hours. Thyroid  Function Tests: No results for input(s): TSH, T4TOTAL, FREET4, T3FREE, THYROIDAB in the last 72 hours. Anemia Panel: No results for input(s): VITAMINB12, FOLATE, FERRITIN, TIBC, IRON, RETICCTPCT in the last 72 hours. Sepsis Labs: Recent Labs  Lab 09/19/24 1050  PROCALCITON <0.10    Recent Results (from the past 240 hours)  Urine Culture     Status: Abnormal (Preliminary result)   Collection Time: 09/19/24 12:17 AM   Specimen: Urine, Random  Result Value Ref Range Status   Specimen Description   Final    URINE, RANDOM Performed at [facility], [address] [place], [place] [address]    Special Requests   Final    NONE  Reflexed from [id number] Performed at [facility], [address] [place], [place] [address]    Culture (A)  Final    10,000 COLONIES/mL ESCHERICHIA COLI CULTURE REINCUBATED FOR BETTER GROWTH SUSCEPTIBILITIES TO FOLLOW Performed at [facility], [address] [place], [place] [address]    Report Status PENDING  Incomplete     Radiology Studies: CT HEAD WO CONTRAST ( ) Result Date: 09/21/2024 EXAM: CT HEAD WITHOUT CONTRAST 09/21/2024 08:18:10 PM TECHNIQUE: CT of the head was performed without the administration of intravenous contrast. Automated exposure control, iterative reconstruction, and/or weight based adjustment of the mA/kV was utilized to reduce the radiation dose to as low as reasonably achievable. COMPARISON: 02/14/2024 CLINICAL HISTORY: Altered mental status, hallucinations. FINDINGS: BRAIN AND VENTRICLES: No acute hemorrhage. No evidence of acute infarct. 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 Similar mass effect on the anterior aspect of the third ventricle. The ventricles are stable in size and configuration compared to prior. 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 Small remote infarcts in the bilateral cerebellum. Patchy periventricular white matter hypodensity consistent with chronic microvascular ischemic changes. No hydrocephalus. No extra-axial collection. No midline shift. ORBITS: No acute abnormality. SINUSES: No acute abnormality. SOFT TISSUES AND SKULL: No acute soft tissue abnormality. No skull fracture. IMPRESSION: 1. No acute intracranial abnormality. 2. Similar appearance of sellar/suprasellar mass compatible with known meningioma. 3. 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 Electronically signed by: Donnice Mania MD 09/21/2024 08:36 PM EST RP Workstation: [id number]    Scheduled  Meds:  aspirin  EC  81 mg Oral Daily   atorvastatin   40 mg Oral q1800   enoxaparin  (LOVENOX ) injection  40 mg Subcutaneous Q24H   fluticasone  furoate-vilanterol  1 puff Inhalation Daily   insulin  aspart  0-9 Units Subcutaneous TID WC   insulin  glargine  40 Units Subcutaneous Daily   lisinopril   20 mg Oral Daily   nicotine   21 mg Transdermal Daily   pantoprazole   40 mg Oral Daily   potassium chloride   40 mEq Oral BID   sertraline   100 mg Oral Daily   trazodone   300 mg Oral QHS   Continuous Infusions:  cefTRIAXone  (ROCEPHIN )  IV Stopped (09/21/24 2244)   magnesium  sulfate bolus IVPB  LOS: 0 days   Norval Bar, MD  [facility]  09/22/2024, 10:52 AM   "

## 2024-09-23 DIAGNOSIS — I160 Hypertensive urgency: Secondary | ICD-10-CM | POA: Diagnosis not present

## 2024-09-23 LAB — BASIC METABOLIC PANEL WITH GFR
Anion gap: 8 (ref 5–15)
BUN: 17 mg/dL (ref 6–20)
CO2: 20 mmol/L — ABNORMAL LOW (ref 22–32)
Calcium: 7.6 mg/dL — ABNORMAL LOW (ref 8.9–10.3)
Chloride: 116 mmol/L — ABNORMAL HIGH (ref 98–111)
Creatinine, Ser: 1.04 mg/dL — ABNORMAL HIGH (ref 0.44–1.00)
GFR, Estimated: >60 mL/min
Glucose, Bld: 155 mg/dL — ABNORMAL HIGH (ref 70–99)
Potassium: 3.4 mmol/L — ABNORMAL LOW (ref 3.5–5.1)
Sodium: 144 mmol/L (ref 135–145)

## 2024-09-23 LAB — CBG MONITORING, ED
Glucose-Capillary: 170 mg/dL — ABNORMAL HIGH (ref 70–99)
Glucose-Capillary: 190 mg/dL — ABNORMAL HIGH (ref 70–99)

## 2024-09-23 LAB — MAGNESIUM: Magnesium: 1.8 mg/dL (ref 1.7–2.4)

## 2024-09-23 LAB — URINE CULTURE: Culture: <10000 — AB

## 2024-09-23 LAB — GLUCOSE, CAPILLARY
Glucose-Capillary: 134 mg/dL — ABNORMAL HIGH (ref 70–99)
Glucose-Capillary: 128 mg/dL — ABNORMAL HIGH (ref 70–99)

## 2024-09-23 MED ORDER — POLYETHYLENE GLYCOL 3350 17 G PO PACK
17.0000 g | PACK | Freq: Every day | ORAL | Status: DC
  Administered 2024-09-23 – 2024-09-26 (×4): 17 g via ORAL
  Filled 2024-09-23 (×4): qty 1

## 2024-09-23 MED ORDER — POTASSIUM CHLORIDE CRYS ER 20 MEQ PO TBCR
40.0000 meq | EXTENDED_RELEASE_TABLET | Freq: Once | ORAL | Status: AC
  Administered 2024-09-23: 40 meq via ORAL
  Filled 2024-09-23: qty 2

## 2024-09-23 MED ORDER — BISACODYL 10 MG RE SUPP: 10.0000 mg | Freq: Every day | RECTAL | Status: DC | PRN

## 2024-09-23 MED ORDER — SENNA 8.6 MG PO TABS
2.0000 | ORAL_TABLET | Freq: Two times a day (BID) | ORAL | Status: DC
  Administered 2024-09-23 – 2024-09-26 (×5): 17.2 mg via ORAL
  Filled 2024-09-23 (×7): qty 2

## 2024-09-23 NOTE — Progress Notes (Signed)
" ° °  Brief Progress Note   _____________________________________________________________________________________________________________  Patient Name: Barbara Wang Patient DOB: 27-Jul-1966 Date: @TODAY @      Data: Reviewed labs, notes, VS.    Action: Requested downgrade.      Response:  MD downgraded pt.  Appreciate the assistance.    _____________________________________________________________________________________________________________  [facility] RN Expeditor Sharolyn JONETTA Batman Please contact us  directly via secure chat (search for [facility]) or by calling us  at [phone] [facility]).  "

## 2024-09-23 NOTE — Discharge Instructions (Signed)
 Food Resources  Agency Name: [facility] Agency Address: [address], [place], [place] [address] Phone: [phone] Website: [email or web address] Service(s) Offered: Housing services, self-sufficiency, congregate meal program, weatherization program, Event organiser program, emergency food assistance,  housing counseling, home ownership program, wheels - to work program.  [employer] free for 60 and older at various locations from [employer], Monday-Friday:  [employer], [address]. [place], [phone] -[facility], [address], [place] [phone]  -[facility], [address], [place] [phone]  -[address], [address] [place], [phone]  Agency Name: [facility] Address: [phone] [address], [place], [place] [address] Phone: [phone] Website: www.[email or web address] Service(s) Offered: Home delivered hot, frozen, and emergency  meals. Grocery assistance program which matches  volunteers one-on-one with seniors unable to grocery shop  for themselves. Must be 60 years and older; less than 20  hours of in-home aide service, limited or no driving ability;  live alone or with someone with a disability; live in  [place].  Agency Name: [facility]) Address: [address], [place], [place] [address] Phone: [phone] Service(s) Offered: Food is served to shut-ins, homeless, elderly, and low income people in the community every Saturday (11:30 am-12:30 pm) and Sunday (12:30 pm-1:30pm). Volunteers also offer help and encouragement in seeking employment,  and spiritual guidance.  Agency Name: Department of Social Services Address: [address] [place], [place] [address] Phone: [phone] Service(s) Offered: Child support services; child welfare services; food stamps; Medicaid; work first family assistance; and aid  with fuel,  rent, food and medicine.  Agency Name: [facility]itian Address: [address] [place], [place] Phone: [phone] Website: [email or web address] Services Offered: Monday 10:00am-12:00, 8:00pm-9:00pm, and Friday 10:00am-12:00.  Agency Name: [facility] [place] Address: [address], [place], [place] [address] Phone: [phone] Website: [email or web address] Service(s) Offered: Serves weekday meals, open from 11:30 am- 1:00 pm., and 6:30-7:30pm, Monday-Wednesday-Friday distributes food 3:30-6pm, Monday-Wednesday-Friday.  Agency Name: [facility] Address: [address] [place], [place] Phone: [phone] Website: [email or web address] Services Offered: Distributes food the 4th Saturday of the month, starting at 8:00 am  Agency Name: [facility] Address: [phone] [address] [place], [place] [address] Phone: [phone] Website: [email or web address] Service(s) Offered: Bread of life, weekly food pantry. Open Wednesdays from 10:00am-noon.  Agency Name: [facility] [employer] Address: [address] [place], [place], [place] Phone: [phone] Services Offered: Distributes food 9am-1pm, Monday-Thursday. Call for details.  Agency Name: [facility] Address: [address] [place], [place] [address] Phone: [phone] Website: [email or web address] Service(s) Offered: Games developer. Call for assistance.  Agency Name: [facility] Address: [address] [place], [place] [address] Phone: [phone] Service Offered: Emergency Food Pantry. Call for appointment.  Agency Name: [facility] Address: [address] [place], [place] [address] Phone: [phone] Website: [email or web address] Services Offered: Games developer. Call for details  Agency Name: [facility] Address: [address] [place], [place] Phone:  [phone] Website: [email or web address] Service(s) Offered: Emergency Food Pantry. Call for details.  Agency Name: Holiday representative Address: [address], [place], [place] [address] Phone: [phone] or [phone] Website: [email or web address] Service(s) Offered: Distribute food 9am-11:30 am, Tuesday-Friday, and 1-3:30pm, Monday-Friday. Food pantry Monday-Friday 1pm-3pm, fresh items, Mon.-Wed.-Fri.  Agency Name: [facility] Address: [address] [place], [place] [address] Phone: [phone] Website: www.[email or web address] Services Offered: Distribute food Tues and Sats from 9:00am-noon.  Closed 1st Saturday of each month. Call for details  Agency Name: [employer] [facility] [employer] Address: [facility] [address] [place] [address] Phone: [phone]  Services Offered: Delivers meals every Thursday   Rent/Utility/Housing  Agency Name: [facility] Agency Address: [address] [place], [place] [address] Phone: [phone] Email: [email or web address] Website: [email or web address] Service(s) Offered: Housing services, self-sufficiency, congregate meal program, weatherization program, Field seismologist program, emergency food assistance,  housing counseling, home ownership program, wheels -towork program.  Agency Name: [facility] Address: [address] [place], [place] [address] Phone: [phone] (8a-4p) [phone] (8p- 10p) Email: [email or web address] Website: [email or web address] Service(s) Offered: A program for homeless and/or needy men that includes one-on-one counseling, life skills training and job rehabilitation.  Agency Name: [facility] [place] Address: [address] [place], [place] [address] Phone: [phone] Website: www.[email or web address] Service(s) Offered: Assistance to needy in emergency with  utility bills, heating fuel, and prescriptions. Shelter for homeless 7pm-7am. December 21, 2016 15  Agency Name: [facility] [place] [facility]Development[facility] Disabled) Address: [address], [place], [place] [address] Phone: [phone] Contact Person: Genie Key Email: [email or web address] Website: [email or web address] Service(s) Offered: Helps individuals with developmental disabilities move from housing that is more restrictive to homes where they  can achieve greater independence and have more  opportunities.  Agency Name: [facility] Address: [address], [place], [place] [address] Phone: [phone] Email: [email or web address] Website: [email or web address].org Service(s) Offered: Provides affordable housing for low-income families, elderly, and disabled individuals. Offer a wide range of  programs and services, from financial planning to afterschool and summer programs.  Agency Name: Department of Social Services Address: [address] [place], [place] [address] Phone: [phone] Service(s) Offered: Child support services; child welfare services; food stamps; Medicaid; work first family assistance; and aid with fuel,  rent, food and medicine.  Agency Name: [facility] [place] [employer]. Address: [facility] [address] [place], [place]  [address] Phone: [phone] Website: www.familyabuseservices.org Service(s) Offered: 24 hour Crisis Line: [phone]; 24 hour Emergency Shelter; Transitional Housing; Support Groups; Scientist, physiological; [employer]; Hispanic Outreach: [phone];  Visitation Center: [phone].  Agency Name: [facility] [place]. Address: [address] [place], [place] [address] Phone: [phone] Service(s) Offered: CAP Services; Home and [employer]; Individual or Group Supports; Respite Care Non-Institutional Nursing;  Residential Supports; Respite Care and Personal Care  Services; Transportation; Family and Friends Night; Recreational Activities; Three Nutritious Meals/Snacks; Consultation with Registered Dietician; Twenty-four hour Registered Nurse Access; Daily and [employer]; Camp Green Leaves; [place] for the [employer] (During Summer Months) Bingo Night (Every  Wednesday Night); Special Populations Dance Night  (Every Tuesday Night); Professional Hair Care Services.  Agency Name: [facility] Address: [address], [place], [place] [address] Phone: [phone] Contact Person: Richardo Chandler Website: [email or web address].Physicist, medical) Offered: Residential treatment facility for women; food and  clothing, educational & employment development and  transportation to work; Counsellor of financial skills;  parenting and family reunification; emotional and spiritual  support; transitional housing for program graduates.  Agency Name: [facility] Address: [address], [place], [place] [address] Phone: [phone] Email: [email or web address] Website: [email or web address].es Service(s) Offered: Public housing units for elderly, disabled, and low income people; housing choice vouchers for income eligible  applicants; shelter plus care vouchers; and Psychologist, clinical.  Agency Name: [facility] [employer] Address: [address], [place], [place] [address] Phone: [phone] Email: [email or web address] Website: www.habitatalamance.org  Service(s) Offered: Build houses for families in need of decent housing. Each adult in the family must invest 200 hours of labor on  someone else's house, work with volunteers to build their own house, attend classes on budgeting, home maintenance, yard care, and attend homeowner association meetings.  Agency Name: [facility] Address: [address], [place], [place] [address] Phone: [phone] Website:  www.rsli.org Service(s) Offered: Intermediate care facilities for intellectually delayed, Supervised Living in group homes for adults with developmental disabilities, Supervised Living for people who have dual diagnoses (MRMI), Independent Living, Supported Living, respite and a variety of CAP services, pre-vocational services, day supports, and [employer].  Agency Name: [place] [facility] Phone: [phone] Website: www.NCForeclosurePrevention.gov Service(s) Offered: Zero-interest, deferred loans to homeowners struggling to pay their mortgage. Call for more information.   Agency Name: [facility] Agency Address: [address] [place], [place] [address] Phone: [phone] Website: www.alamanceservices.org Service(s) Offered: Housing services, self-sufficiency, congregate meal program, and individual development account program.  Agency Name: [facility] [place] Address: [address] [place], [place] [address] Phone: [phone] Email: [email or web address] Website: www.alliedchurches.org Service(s) Offered: Housing the homeless, feeding the hungry, Company secretary, job and education related services.  Agency Name: [facility] Address: [address], [place], [place] [address] Phone: [phone] Email: [email or web address] Service(s) Offered: Counseling, problem pregnancy, advocacy for Hispanics, limited emergency financial assistance.  Agency Name: Department of Social Services Address: [address] [place], [place] [address] Phone: [phone] Website: [email or web address] Service(s) Offered: Child support services; child welfare services; SNAP; Medicaid; work first family assistance; and aid with fuel,  rent, food and medicine.  Agency Name: Holiday representative Address: [address], [place], [place] [address] Phone: [phone] or [phone] Email:  [email or web address] Service(s) Offered: Family services and transient assistance; emergency food, fuel, clothing, limited furniture, utilities; budget counseling, general counseling; give a kid a coat; thrift store; Christmas food and toys. Utility assistance, food pantry, rental  assistance, life sustaining medicine     Transportation Resources for [employer]  Agency Name: [facility] Agency Address: [address] [place], [place] [address] Phone: [phone] Email: [email or web address] Website: www.[email or web address].org Service(s) Offered: Housing services, self-sufficiency, congregate meal program, weatherization program, Field seismologist program, emergency food assistance,  housing counseling, home ownership program, wheels-towork program.  Agency Name: [facility] [employer] [phone]) Address: [address] [place], [place] [address] Phone: [phone] Website: [email or web address].acta-[email or web address] Service(s) Offered: Transportation for [employer], subscription and demand response; Dial-a-Ride for citizens 68 years of age or older.  Agency Name: Department of Social Services Address: [address] [place], [place] [address] Phone: [phone] Service(s) Offered: Child support services; child welfare services; food stamps; Medicaid; work first family assistance; and aid with fuel,  rent, food and medicine, transportation assistance.  Agency Name: Disabled [employer] (DAV) Transportation  Network Phone: [phone] Service(s) Offered: Transports veterans to the [facility]. Call  forty-eight hours in advance and leave the name, telephone  number, date, and time of appointment. Veteran will be  contacted by the driver the day before the appointment to  arrange a pick up point    [employer] ACTA currently provides door to door  services. ACTA connects with PART daily for services to [facility]. ACTA also performs contract services to [employer] operates 27 vehicles, all but 3 mini-vans are equipped with lifts for special needs as well as the general public. ACTA drivers are  each CDL certified and trained in First Aid and CPR. ACTA was established in 2002 by [employer]. An independent Industrial/product designer. ACTA operates via Cytogeneticist with required local 10% match funding from [place]. ACTA provides over 80,000 passenger trips each year, including [facility] and [employer] sites.  Call at least by 11 AM one business day prior to needing transportation  [employer].                      [place], [place] [address]     Office Hours: Monday-Friday  8 AM - 5 PM

## 2024-09-23 NOTE — Progress Notes (Signed)
 " PROGRESS NOTE    Barbara Wang  FMW:[id number] DOB: 1966/02/10 DOA: 09/21/2024 PCP: Theotis Haze ORN, NP  Subjective: No acute events overnight. Seen and examined at bedside. No new complaints. Unable to provide meaningful history due to altered mentation. Reports feeling constipation. Reports does not feel like eating right now. Denies nausea/vomiting.   Hospital Course:  59 y.o. female with medical history significant of poorly controlled diabetes, HTN, HLD, asthma, depression with anxiety, brain tumor, neuropathy, chronic diarrhea, meningioma, who presented under unclear circumstances with a different story being told to different providers. As per admitting provider, patient presented with weakness, urinary frequency and dysuria. As per ED provider, daughter told EMS staff that patient was confused with hallucinating and that's why EMS was called. Patient reports to me that she presented with hyperglycemia. When I spoke with daughter on 1/26, she states EMS was called as patient noted to be confused with visual hallucinations (seeing extended family members walking around the house when none present).    Admitted for acute encephalopathy, hyperglycemia and possible UTI.   Assessment and Plan:  Acute encephalopathy UTI (urinary tract infection):  Patient and daughter deny her having any urinary symptoms and that being the reason for her to come back to hospital which is contrary to what is documented by the ED and admitting providers. Unsure if patient is a reliable historian at all at this time Unknown baseline mentation but daughter did report she has some memory loss issues. Advised daughter to come to hospital to see the patient and let the treatment team know whether she is or isn't at/near her baseline mentation As per daughter, patient was confused and having visual  hallucinations at home where have not been observed at the hospital since arrival Etiology unclear,  possibilities include hyperglycemia, hypertensive urgency, UTI CT head unremarkable No overt metabolic derangements Patient had positive urinalysis and urine culture with 10,000 E. coli in his recent admission. UA concerning for UTI Patient received 2 doses of Rocephin  on 1/22 and 1/23 before discharge - Cont Rocephin  IV - follow urine cultures - monitor oral intake, I/Os, Bms, sleep routine - monitor on fall and delirium precautions - if continues to have memory loss issues despite treatment of reversible causes, may benefit from outpatient neuropsychological testing   Constipation - patient reports feeling constipated and cannot recall when last BM was - will start bowel regimen - monitor for regular BMs  Hypoglycemia Uncontrolled type 2 diabetes mellitus with hyperglycemia (HCC):  No concern for DKA/HHS on admission  Patient states she is taking insulin , but does not know her diabetic medications at all.  Concern for medication noncompliance, although, daughter states she has been preparing the meds as prescribed for her mother to take at home in front of her.  Recent A1c 12.2.  - Patient is supposed to take Ozempic  and Tresiba  66 units daily at home. - seen by diabetes coordinator - stop Glargine insulin  given low glucoses - cont SSI with FS ACHS - cont diabetic diet. May need to liberalize if glucose continues to trend low - adjust insulin  as needed   Hypokalemia - monitor and replete electrolytes as needed   Hypertensive urgency:  BP better  At the recent discharge, her Prinzide  was held - Cont lisinopril  20 mg - Hold HCTZ for now. Resume if BP remains elevated  - IV hydralazine  as needed 10 mg q2h for SBP> 165.   HLD (hyperlipidemia) -Lipitor   Asthma, chronic: Stable, no wheezing -Bronchodilators and prn  Mucinex    Anxiety and depression -Zoloft  and trazodone    Overweight (BMI 25.0-29.9): Body weight 78.1 kg, BMI 29.55 - Encourage losing weight - Exercise  healthy diet  DVT prophylaxis: enoxaparin  (LOVENOX ) injection 40 mg Start: 09/22/24 1000  Lovenox    Code Status: Full Code  Disposition Plan: TBD Reason for continuing need for hospitalization: severity of illness, PT/OT eval pending  Objective: Vitals:   09/23/24 0330 09/23/24 0600 09/23/24 1014 09/23/24 1015  BP: 98/60 127/68 121/73   Pulse: 66 (!) 53 67   Resp: 15 16 16    Temp: (!) 97.3 F (36.3 C) 97.8 F (36.6 C)  98.7 F (37.1 C)  TempSrc: Axillary Oral  Oral  SpO2: 99% 97% 97%   Weight:      Height:        Intake/Output Summary (Last 24 hours) at 09/23/2024 1033 Last data filed at 09/23/2024 0600 Gross per 24 hour  Intake 200 ml  Output 200 ml  Net 0 ml   Filed Weights   09/22/24 1923  Weight: 78 kg    Examination:  Physical Exam Vitals and nursing note reviewed.  Constitutional:      General: She is not in acute distress.    Appearance: She is ill-appearing.     Comments: Weak, frail  HENT:     Head: Normocephalic and atraumatic.  Cardiovascular:     Rate and Rhythm: Normal rate and regular rhythm.     Pulses: Normal pulses.     Heart sounds: Normal heart sounds.  Pulmonary:     Effort: Pulmonary effort is normal. No respiratory distress.     Breath sounds: Normal breath sounds. No wheezing.  Abdominal:     General: Bowel sounds are normal. There is no distension.     Palpations: Abdomen is soft.     Tenderness: There is no abdominal tenderness.  Neurological:     Mental Status: She is alert. She is disoriented.     Comments: Oriented to person only, not place or time     Data Reviewed: I have personally reviewed following labs and imaging studies  CBC: Recent Labs  Lab 09/19/24 0017 09/19/24 0358 09/21/24 2012 09/22/24 0400  WBC 6.6 7.8 7.0 8.1  NEUTROABS 4.2  --  4.5  --   HGB 13.2 13.3 13.2 12.5  HCT 38.5 39.1 38.5 37.3  MCV 84.8 84.6 83.9 84.0  PLT 323 327 312 335   Basic Metabolic Panel: Recent Labs  Lab 09/19/24 0017  09/19/24 0358 09/21/24 2012 09/22/24 0400 09/23/24 0348  NA 137 142 138 146* 144  K 3.7 3.2* 4.0 3.0* 3.4*  CL 99 103 100 110 116*  CO2 27 27 26 27  20*  GLUCOSE 602* 342* 478* 54* 155*  BUN 17 16 13 12 17   CREATININE 1.30* 1.13* 1.00 0.79 1.04*  CALCIUM  9.8 9.9 9.7 9.3 7.6*  MG  --   --   --  1.7 1.8   GFR: Estimated Creatinine Clearance: 58.1 mL/min (A) (by C-G formula based on SCr of 1.04 mg/dL (H)). Liver Function Tests: Recent Labs  Lab 09/19/24 0017 09/21/24 2012  AST 17 23  ALT 8 11  ALKPHOS 109 113  BILITOT 0.5 0.5  PROT 7.1 7.4  ALBUMIN 3.8 4.1   No results for input(s): LIPASE, AMYLASE in the last 168 hours. No results for input(s): AMMONIA in the last 168 hours. Coagulation Profile: No results for input(s): INR, PROTIME in the last 168 hours. Cardiac Enzymes: No results for input(s):  CKTOTAL, CKMB, CKMBINDEX, TROPONINI in the last 168 hours. ProBNP, BNP (last 5 results) No results for input(s): PROBNP, BNP in the last 8760 hours. HbA1C: No results for input(s): HGBA1C in the last 72 hours. CBG: Recent Labs  Lab 09/22/24 1255 09/22/24 1406 09/22/24 1828 09/22/24 2145 09/23/24 0744  GLUCAP 59* 146* 149* 155* 170*   Lipid Profile: No results for input(s): CHOL, HDL, LDLCALC, TRIG, CHOLHDL, LDLDIRECT in the last 72 hours. Thyroid  Function Tests: No results for input(s): TSH, T4TOTAL, FREET4, T3FREE, THYROIDAB in the last 72 hours. Anemia Panel: No results for input(s): VITAMINB12, FOLATE, FERRITIN, TIBC, IRON, RETICCTPCT in the last 72 hours. Sepsis Labs: Recent Labs  Lab 09/19/24 1050  PROCALCITON <0.10    Recent Results (from the past 240 hours)  Urine Culture     Status: Abnormal (Preliminary result)   Collection Time: 09/19/24 12:17 AM   Specimen: Urine, Random  Result Value Ref Range Status   Specimen Description   Final    URINE, RANDOM Performed at [facility], [address], [place], [place] [address]    Special Requests   Final    NONE Reflexed from [id number] Performed at [facility], [address] [place], [place] [address]    Culture (A)  Final    10,000 COLONIES/mL ESCHERICHIA COLI REPEATING SUSCEPTIBILITY Performed at [facility], [address], [place], [place] [address]    Report Status PENDING  Incomplete  Urine Culture (for pregnant, neutropenic or urologic patients or patients with an indwelling urinary catheter)     Status: Abnormal   Collection Time: 09/21/24 11:15 PM   Specimen: Urine, Clean Catch  Result Value Ref Range Status   Specimen Description   Final    URINE, CLEAN CATCH Performed at [facility], [address], [place], [place] [address]    Special Requests   Final    NONE Performed at [facility], [address] [place], [place] [address]    Culture (A)  Final    <10,000 COLONIES/mL INSIGNIFICANT GROWTH Performed at [facility], [address] [place], [place] [address]    Report Status 09/23/2024 FINAL  Final     Radiology Studies: CT HEAD WO CONTRAST ( ) Result Date: 09/21/2024 EXAM: CT HEAD WITHOUT CONTRAST 09/21/2024 08:18:10 PM TECHNIQUE: CT of the head was performed without the administration of intravenous contrast. Automated exposure control, iterative reconstruction, and/or weight based adjustment of the mA/kV was utilized to reduce the radiation dose to as low as reasonably achievable. COMPARISON: 02/14/2024 CLINICAL HISTORY: Altered mental status, hallucinations. FINDINGS: BRAIN AND VENTRICLES: No acute hemorrhage. No evidence of acute infarct. 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 Similar mass effect on the anterior aspect of the third ventricle. The ventricles are stable in size and configuration compared to prior. 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 Small remote infarcts in the bilateral cerebellum. Patchy periventricular white matter hypodensity consistent with chronic microvascular ischemic changes. No hydrocephalus. No extra-axial collection. No midline shift. ORBITS: No acute abnormality. SINUSES: No acute abnormality. SOFT TISSUES AND SKULL: No acute soft tissue abnormality. No skull fracture. IMPRESSION: 1. No acute intracranial abnormality. 2. Similar appearance of sellar/suprasellar mass compatible with known meningioma. 3. 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 Electronically signed by: Donnice Mania MD 09/21/2024 08:36 PM EST RP Workstation: [id number]  Scheduled Meds:  aspirin  EC  81 mg Oral Daily   atorvastatin   40 mg Oral q1800   enoxaparin  (LOVENOX ) injection  40 mg Subcutaneous Q24H   fluticasone  furoate-vilanterol  1 puff Inhalation Daily   insulin  aspart  0-5 Units Subcutaneous QHS   insulin  aspart  0-9 Units Subcutaneous TID WC   lisinopril   20 mg Oral Daily   melatonin  2.5 mg Oral QHS   nicotine   21 mg Transdermal Daily   pantoprazole   40 mg Oral Daily   sertraline   100 mg Oral Daily   trazodone   300 mg Oral QHS   Continuous Infusions:  cefTRIAXone  (ROCEPHIN )  IV Stopped (09/22/24 2315)     LOS: 0 days   Norval Bar, MD  [facility]  09/23/2024, 10:33 AM   "

## 2024-09-23 NOTE — Evaluation (Signed)
 Occupational Therapy Evaluation Patient Details Name: Barbara Wang MRN: [id number] DOB: 05-05-1966 Today's Date: 09/23/2024   History of Present Illness   59 y.o. female with medical history significant of poorly controlled diabetes, HTN, HLD, asthma, depression with anxiety, brain tumor, neuropathy, chronic diarrhea, meningioma, who presents with weakness, urinary frequency and dysuria     Patient was recently hospitalized from 1/23 - 1/24 due to uncontrolled diabetes with glycemia.  Patient was discharged on Lantus  66 units daily.  Patient was also found to have UTI.     Clinical Impressions Patient presenting with decreased Ind in self care,balance, functional mobility, transfer, strength, and safety awareness. Patient is very lethargic and needing to constantly be woken up when asked a question. Pt endorses living in an apartment with family. They work during the day. She uses RW for ambulation and needs some assist with ADLs. No family present to confirm. Pt is oriented to self. Pt needing total A for bed mobility and unable to follow any commands this session. She becomes resistive once on EOB and assisted back to supine for safety. She is far from baseline.  Patient will benefit from acute OT to increase overall independence in the areas of ADLs, functional mobility, and safety awareness in order to safely discharge.     If plan is discharge home, recommend the following:   A lot of help with walking and/or transfers;A lot of help with bathing/dressing/bathroom;Assistance with cooking/housework;Assist for transportation;Help with stairs or ramp for entrance;Supervision due to cognitive status     Functional Status Assessment   Patient has had a recent decline in their functional status and demonstrates the ability to make significant improvements in function in a reasonable and predictable amount of time.     Equipment Recommendations   Other (comment) (defer)       Precautions/Restrictions   Precautions Precautions: Fall     Mobility Bed Mobility Overal bed mobility: Needs Assistance Bed Mobility: Supine to Sit, Sit to Supine     Supine to sit: Total assist Sit to supine: Total assist   General bed mobility comments: Pt gave no effort    Transfers                   General transfer comment: not attempted secondary to safety concerns and lethargy      Balance Overall balance assessment: Needs assistance Sitting-balance support: Feet supported Sitting balance-Leahy Scale: Poor Sitting balance - Comments: mod A for static sitting balance Postural control: Posterior lean                                 ADL either performed or assessed with clinical judgement   ADL Overall ADL's : Needs assistance/impaired                                       General ADL Comments: max- total A     Vision Patient Visual Report: No change from baseline              Pertinent Vitals/Pain Pain Assessment Pain Assessment: No/denies pain     Extremity/Trunk Assessment Upper Extremity Assessment Upper Extremity Assessment: Generalized weakness   Lower Extremity Assessment Lower Extremity Assessment: Generalized weakness       Communication Communication Communication: Impaired Factors Affecting Communication: Difficulty expressing self   Cognition Arousal: Lethargic  Behavior During Therapy: Flat affect Cognition: No family/caregiver present to determine baseline, Cognition impaired   Orientation impairments: Place, Time, Situation Awareness: Intellectual awareness impaired, Online awareness impaired   Attention impairment (select first level of impairment): Focused attention Executive functioning impairment (select all impairments): Initiation, Organization, Sequencing, Reasoning, Problem solving OT - Cognition Comments: unable to follow any formal commands this session                  Following commands: Impaired Following commands impaired:  (no commands followed this session)     Cueing  General Comments   Cueing Techniques: Verbal cues;Gestural cues;Tactile cues;Visual cues              Home Living Family/patient expects to be discharged to:: Private residence Living Arrangements: Children (daughters) Available Help at Discharge: Family;Available PRN/intermittently Type of Home: Apartment Home Access: Stairs to enter Entrance Stairs-Number of Steps: 15 Entrance Stairs-Rails: Can reach both Home Layout: One level     Bathroom Shower/Tub: Chief Strategy Officer: Standard Bathroom Accessibility: (P) Yes   Home Equipment: Rolling Walker (2 wheels);Cane - single point;Hand held shower head          Prior Functioning/Environment Prior Level of Function : Needs assist;Patient poor historian/Family not available             Mobility Comments: mod I with use of RW for mobility ADLs Comments: Pt reports being Ind but needing assistance at times and family assist with IADLS.    OT Problem List: Decreased strength;Decreased activity tolerance;Decreased cognition;Impaired balance (sitting and/or standing);Decreased safety awareness   OT Treatment/Interventions: Self-care/ADL training;Balance training;Therapeutic exercise;Therapeutic activities;Energy conservation;Cognitive remediation/compensation;DME and/or AE instruction;Patient/family education      OT Goals(Current goals can be found in the care plan section)   Acute Rehab OT Goals Patient Stated Goal: to rest OT Goal Formulation: With patient Time For Goal Achievement: 10/07/24 Potential to Achieve Goals: Fair ADL Goals Pt Will Perform Grooming: with min assist;standing Pt Will Perform Lower Body Dressing: with min assist;sit to/from stand Pt Will Transfer to Toilet: with min assist;ambulating Pt Will Perform Toileting - Clothing Manipulation and hygiene: with min  assist;sit to/from stand   OT Frequency:  Min 2X/week       AM-PAC OT 6 Clicks Daily Activity     Outcome Measure Help from another person eating meals?: Total Help from another person taking care of personal grooming?: Total Help from another person toileting, which includes using toliet, bedpan, or urinal?: Total Help from another person bathing (including washing, rinsing, drying)?: Total Help from another person to put on and taking off regular upper body clothing?: Total Help from another person to put on and taking off regular lower body clothing?: Total 6 Click Score: 6   End of Session Nurse Communication: Mobility status;Other (comment) (Pt on RA during session)  Activity Tolerance: Patient limited by lethargy Patient left: in bed;with call bell/phone within reach;with bed alarm set  OT Visit Diagnosis: Unsteadiness on feet (R26.81);Muscle weakness (generalized) (M62.81)                Time: 9046-8993 OT Time Calculation (min): 13 min Charges:  OT General Charges $OT Visit: 1 Visit OT Evaluation $OT Eval Moderate Complexity: 1 [address], [place], OTR/L , CBIS ascom [phone]  09/23/24, 10:44 AM

## 2024-09-23 NOTE — Plan of Care (Signed)
   Problem: Education: Goal: Knowledge of General Education information will improve Description: Including pain rating scale, medication(s)/side effects and non-pharmacologic comfort measures Outcome: Progressing   Problem: Clinical Measurements: Goal: Ability to maintain clinical measurements within normal limits will improve Outcome: Progressing

## 2024-09-24 DIAGNOSIS — E663 Overweight: Secondary | ICD-10-CM | POA: Diagnosis not present

## 2024-09-24 DIAGNOSIS — R531 Weakness: Secondary | ICD-10-CM | POA: Diagnosis not present

## 2024-09-24 DIAGNOSIS — E785 Hyperlipidemia, unspecified: Secondary | ICD-10-CM | POA: Diagnosis not present

## 2024-09-24 DIAGNOSIS — J4520 Mild intermittent asthma, uncomplicated: Secondary | ICD-10-CM | POA: Diagnosis not present

## 2024-09-24 DIAGNOSIS — N179 Acute kidney failure, unspecified: Secondary | ICD-10-CM

## 2024-09-24 DIAGNOSIS — E11649 Type 2 diabetes mellitus with hypoglycemia without coma: Principal | ICD-10-CM

## 2024-09-24 DIAGNOSIS — Z794 Long term (current) use of insulin: Secondary | ICD-10-CM

## 2024-09-24 DIAGNOSIS — F32A Depression, unspecified: Secondary | ICD-10-CM | POA: Diagnosis not present

## 2024-09-24 DIAGNOSIS — F419 Anxiety disorder, unspecified: Secondary | ICD-10-CM | POA: Diagnosis not present

## 2024-09-24 DIAGNOSIS — I160 Hypertensive urgency: Secondary | ICD-10-CM | POA: Diagnosis not present

## 2024-09-24 DIAGNOSIS — R5383 Other fatigue: Secondary | ICD-10-CM | POA: Diagnosis not present

## 2024-09-24 DIAGNOSIS — E876 Hypokalemia: Secondary | ICD-10-CM | POA: Diagnosis not present

## 2024-09-24 LAB — BASIC METABOLIC PANEL WITH GFR
Anion gap: 9 (ref 5–15)
BUN: 20 mg/dL (ref 6–20)
CO2: 23 mmol/L (ref 22–32)
Calcium: 9.4 mg/dL (ref 8.9–10.3)
Chloride: 112 mmol/L — ABNORMAL HIGH (ref 98–111)
Creatinine, Ser: 1.11 mg/dL — ABNORMAL HIGH (ref 0.44–1.00)
GFR, Estimated: 57 mL/min — ABNORMAL LOW
Glucose, Bld: 153 mg/dL — ABNORMAL HIGH (ref 70–99)
Potassium: 4 mmol/L (ref 3.5–5.1)
Sodium: 144 mmol/L (ref 135–145)

## 2024-09-24 LAB — GLUCOSE, CAPILLARY
Glucose-Capillary: 155 mg/dL — ABNORMAL HIGH (ref 70–99)
Glucose-Capillary: 192 mg/dL — ABNORMAL HIGH (ref 70–99)
Glucose-Capillary: 207 mg/dL — ABNORMAL HIGH (ref 70–99)
Glucose-Capillary: 270 mg/dL — ABNORMAL HIGH (ref 70–99)

## 2024-09-24 MED ORDER — SODIUM CHLORIDE 0.9 % IV BOLUS
500.0000 mL | Freq: Once | INTRAVENOUS | Status: AC
  Administered 2024-09-24: 500 mL via INTRAVENOUS

## 2024-09-24 MED ORDER — AMLODIPINE BESYLATE 5 MG PO TABS
5.0000 mg | ORAL_TABLET | Freq: Once | ORAL | Status: AC
  Administered 2024-09-24: 5 mg via ORAL
  Filled 2024-09-24: qty 1

## 2024-09-24 MED ORDER — AMLODIPINE BESYLATE 5 MG PO TABS: 5.0000 mg | ORAL_TABLET | Freq: Every day | ORAL | Status: DC

## 2024-09-24 MED ORDER — POTASSIUM CHLORIDE CRYS ER 20 MEQ PO TBCR: 20.0000 meq | EXTENDED_RELEASE_TABLET | Freq: Once | ORAL | Status: DC

## 2024-09-24 MED ORDER — AMLODIPINE BESYLATE 10 MG PO TABS
10.0000 mg | ORAL_TABLET | Freq: Every day | ORAL | Status: DC
  Administered 2024-09-25 – 2024-09-26 (×2): 10 mg via ORAL
  Filled 2024-09-24 (×2): qty 1

## 2024-09-24 MED ORDER — SPIRONOLACTONE 25 MG PO TABS
25.0000 mg | ORAL_TABLET | Freq: Every day | ORAL | Status: DC
  Administered 2024-09-24 – 2024-09-26 (×3): 25 mg via ORAL
  Filled 2024-09-24 (×3): qty 1

## 2024-09-24 MED ORDER — AMLODIPINE BESYLATE 5 MG PO TABS
5.0000 mg | ORAL_TABLET | Freq: Every day | ORAL | Status: DC
  Administered 2024-09-24: 5 mg via ORAL
  Filled 2024-09-24: qty 1

## 2024-09-24 NOTE — Assessment & Plan Note (Addendum)
 Ruled out.  Urine culture with insignificant growth.  Will discontinue Rocephin .

## 2024-09-24 NOTE — Assessment & Plan Note (Signed)
 Will give a fluid bolus and hold lisinopril .

## 2024-09-24 NOTE — Assessment & Plan Note (Addendum)
 On Zoloft .  Lower the dose of trazodone  and make as needed for insomnia because she was lethargic this morning.

## 2024-09-24 NOTE — TOC Initial Note (Signed)
 Transition of Care [facility]) - Initial/Assessment Note    Patient Details  Name: Barbara Wang MRN: [id number] Date of Birth: Nov 05, 1965  Transition of Care [facility]) CM/SW Contact:    Victory Jackquline RAMAN, RN Phone Number: 09/24/2024, 2:58 PM  Clinical Narrative:                 RNCM met with patient at bedside. introduced myself and my role and explained that discharge planning would be discussed. PT is recommending STR. Patient asked me to give her daughter a call. RNCM called patient's daughter [name] @ [phone], introduced myself and my role and that discharge planning would be discussed. I informed her that I spoke with her mother and she asked me to call her. Advised her that PT was recommending STR. Patient's daughter is in agreement and asked  us  to submit referral's. RNCM will review bed offers once received. FL2 completed, PASRR# [id number] and referral's submitted. RNCM will continue to follow for discharge planning needs.   Expected Discharge Plan: Skilled Nursing Facility Barriers to Discharge: Continued Medical Work up   Patient Goals and CMS Choice            Expected Discharge Plan and Services       Living arrangements for the past 2 months: Single Family Home                                      Prior Living Arrangements/Services Living arrangements for the past 2 months: Single Family Home Lives with:: Adult Children, Self, Minor Children Patient language and need for interpreter reviewed:: Yes Do you feel safe going back to the place where you live?: Yes      Need for Family Participation in Patient Care: Yes (Comment) Care giver support system in place?: Yes (comment) Current home services: DME Criminal Activity/Legal Involvement Pertinent to Current Situation/Hospitalization: No - Comment as needed  Activities of Daily Living   ADL Screening (condition at time of admission) Independently performs ADLs?: Yes (appropriate for developmental  age) Is the patient deaf or have difficulty hearing?: No Does the patient have difficulty seeing, even when wearing glasses/contacts?: No Does the patient have difficulty concentrating, remembering, or making decisions?: No  Permission Sought/Granted                  Emotional Assessment Appearance:: Appears stated age, Well-Groomed Attitude/Demeanor/Rapport: Gracious Affect (typically observed): Calm, Pleasant, Quiet Orientation: : Oriented to Self, Oriented to Place, Fluctuating Orientation (Suspected and/or reported Sundowners) Alcohol / Substance Use: Not Applicable Psych Involvement: No (comment)  Admission diagnosis:  Primary insomnia [F51.01] Hallucination [R44.3] UTI (urinary tract infection) [N39.0] Hyperglycemia [R73.9] Mixed simple and mucopurulent chronic bronchitis (HCC) [J41.8] History of CVA (cerebrovascular accident) without residual deficits [Z86.73] Hypertensive urgency [I16.0] Type 2 diabetes mellitus with insulin  therapy (HCC) [E11.9, Z79.4] Altered mental status, unspecified altered mental status type [R41.82] Diabetic polyneuropathy associated with type 2 diabetes mellitus (HCC) [E11.42] Moderate episode of recurrent major depressive disorder (HCC) [F33.1] Gastroesophageal reflux disease, unspecified whether esophagitis present [K21.9] Patient Active Problem List   Diagnosis Date Noted   Uncontrolled type 2 diabetes mellitus with hypoglycemia, with long-term current use of insulin  (HCC) 09/24/2024   Generalized weakness 09/24/2024   HTN (hypertension) 09/21/2024   HLD (hyperlipidemia) 09/21/2024   Overweight (BMI 25.0-29.9) 09/21/2024   Hypertensive urgency 09/21/2024   Uncontrolled type 2 diabetes mellitus with hyperglycemia (HCC) 09/19/2024  Acute lower UTI 09/19/2024   Dyslipidemia 09/19/2024   Anxiety and depression 09/19/2024   Peripheral neuropathy 09/19/2024   AKI (acute kidney injury) 09/19/2024   Asthma, chronic 09/19/2024   Visual  impairment due to diabetes mellitus (HCC) 02/17/2024   Chronic diarrhea 02/17/2024   Subclinical hyperthyroidism 02/16/2024   Urinary incontinence due diabetic neuropathy 02/16/2024   Impaired mobility 02/16/2024   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   Hx of Lacunar infarction (HCC) 02/16/2024   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   Hyperglycemia 02/14/2024   DKA (diabetic ketoacidosis) (HCC) 08/23/2023   Acute kidney injury 05/14/2022   Moderate episode of recurrent major depressive disorder (HCC) 01/26/2022   Vitamin D  deficiency 01/26/2022   Antibiotic-induced yeast infection 01/26/2022   DKA, type 2, not at goal [facility]) 09/30/2021   Diabetic ketoacidosis without coma associated with diabetes mellitus due to underlying condition (HCC) 09/18/2021   Pain in joint of right knee 09/14/2021   Osteoarthritis of knee 08/16/2021   Poorly controlled type 2 diabetes mellitus with complication (HCC) 04/27/2021   Essential hypertension 04/27/2021   Wrist pain, acute, right 07/22/2019   Mixed hyperlipidemia 07/22/2019   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, [address] [place] [address] Phone: [phone] Fax: [phone]  [facility] [address], [place] - [address] [facility] [place] [address] Phone: [phone] Fax: [phone]  [id number] [place], [place] - [address] AT [facility] [address] [place] [address] Phone: [phone] Fax: [phone]  [facility] [address] [place] [address] Phone:  [phone] Fax: [phone]     Social Drivers of Health (SDOH) Social History: SDOH Screenings   Food Insecurity: No Food Insecurity (09/23/2024)  Recent Concern: Food Insecurity - Food Insecurity Present (09/19/2024)  Housing: High Risk (09/23/2024)  Transportation Needs: Unmet Transportation Needs (09/23/2024)  Utilities: Not At Risk (09/23/2024)  Recent Concern: Utilities - At Risk (09/19/2024)  Depression (PHQ2-9): High Risk (09/24/2023)  Financial Resource Strain: Low Risk (07/31/2023)  Physical Activity: Inactive (07/31/2023)  Stress: No Stress Concern Present (07/31/2023)  Tobacco Use: High Risk (09/21/2024)  Health Literacy: Adequate Health Literacy (07/31/2023)   SDOH Interventions: Food Insecurity Interventions: Inpatient TOC (Resources added to AVS.) Housing Interventions: Inpatient TOC (Resources added to AVS.) Transportation Interventions: Inpatient TOC (Resources added to AVS.) Utilities Interventions: Inpatient TOC (Resources added to AVS.)   Readmission Risk Interventions     No data to display

## 2024-09-24 NOTE — Evaluation (Signed)
 Physical Therapy Evaluation Patient Details Name: Barbara Wang MRN: [id number] DOB: 02/19/1966 Today's Date: 09/24/2024  History of Present Illness  59 y.o. female with medical history significant of poorly controlled diabetes, HTN, HLD, asthma, depression with anxiety, brain tumor, neuropathy, chronic diarrhea, meningioma, who presents with weakness, urinary frequency and dysuria     Patient was recently hospitalized from 1/23 - 1/24 due to uncontrolled diabetes with glycemia.  Patient was discharged on Lantus  66 units daily.  Patient was also found to have UTI.   Clinical Impression  Patient received in bed, she is alert and pleasant. Agrees to PT assessment. Patient is oriented x 4. Slow to respond and initiate movement with direction. Requires max cues. Mod A for bed mobility and min/mod A for sit to stand from bed and BSC. Patient was found to be wet and soiled in bed, therefore required full linen changed and clean up. Patient was able to pivot to [facility] and then take steps with RW to recliner. Patient will continue to benefit from skilled PT to improve functional mobility, strength and independence.         If plan is discharge home, recommend the following: A lot of help with walking and/or transfers;A lot of help with bathing/dressing/bathroom   Can travel by private vehicle   No    Equipment Recommendations None recommended by PT  Recommendations for Other Services       Functional Status Assessment Patient has had a recent decline in their functional status and demonstrates the ability to make significant improvements in function in a reasonable and predictable amount of time.     Precautions / Restrictions Precautions Precautions: Fall Recall of Precautions/Restrictions: Impaired Restrictions Weight Bearing Restrictions Per Provider Order: No      Mobility  Bed Mobility Overal bed mobility: Needs Assistance Bed Mobility: Supine to Sit     Supine to sit: Mod  assist     General bed mobility comments: patient able to assist, but needs mod A to go from side lying to sit and to scoot forward once sitting.    Transfers Overall transfer level: Needs assistance Equipment used: Rolling walker (2 wheels), None Transfers: Sit to/from Stand, Bed to chair/wheelchair/BSC Sit to Stand: Mod assist   Step pivot transfers: Min assist       General transfer comment: patient is able to stand with RW. Able to pivot to [facility] without walker    Ambulation/Gait Ambulation/Gait assistance: Contact guard assist Gait Distance (Feet): 4 Feet Assistive device: Rolling walker (2 wheels) Gait Pattern/deviations: Step-to pattern, Decreased step length - right, Decreased step length - left, Decreased stride length Gait velocity: decr     General Gait Details: patient is able to stand for prolonged period to get cleaned up. Able to take steps with RW and min A with max cues to get into recliner from [facility].  Stairs            Wheelchair Mobility     Tilt Bed    Modified Rankin (Stroke Patients Only)       Balance Overall balance assessment: Needs assistance Sitting-balance support: Feet supported Sitting balance-Leahy Scale: Poor Sitting balance - Comments: initially posterior leaning with sitting edge of bed. She is able to achieve balance with cues and postitioning with assistance. Postural control: Posterior lean Standing balance support: During functional activity, Bilateral upper extremity supported, Reliant on assistive device for balance Standing balance-Leahy Scale: Fair  Pertinent Vitals/Pain Pain Assessment Pain Assessment: No/denies pain    Home Living Family/patient expects to be discharged to:: Private residence Living Arrangements: Children Available Help at Discharge: Family;Available PRN/intermittently Type of Home: House Home Access: Level entry     Alternate Level Stairs-Number of  Steps: flight Home Layout: Two level Home Equipment: [employer] (2 wheels);[place] - single point Additional Comments: patient reports she lives with children and grandchildren. Her bedroom is up a flight of steps.    Prior Function Prior Level of Function : Needs assist;Patient poor historian/Family not available             Mobility Comments: patient reports she was using cane prior to admission, has walker ADLs Comments: Pt reports being Ind but needing assistance at times and family assist with IADLS.     Extremity/Trunk Assessment   Upper Extremity Assessment Upper Extremity Assessment: Defer to OT evaluation    Lower Extremity Assessment Lower Extremity Assessment: Generalized weakness    Cervical / Trunk Assessment Cervical / Trunk Assessment: Normal  Communication   Communication Communication: No apparent difficulties    Cognition Arousal: Alert Behavior During Therapy: Flat affect   PT - Cognitive impairments: Problem solving, Safety/Judgement                         Following commands: Impaired Following commands impaired: Follows one step commands with increased time     Cueing Cueing Techniques: Verbal cues, Gestural cues, Visual cues     General Comments      Exercises     Assessment/Plan    PT Assessment Patient needs continued PT services  PT Problem List Decreased strength;Decreased activity tolerance;Decreased balance;Decreased mobility;Decreased cognition;Decreased coordination;Decreased safety awareness;Decreased knowledge of use of DME       PT Treatment Interventions DME instruction;Gait training;Functional mobility training;Stair training;Therapeutic activities;Therapeutic exercise;Balance training;Neuromuscular re-education;Cognitive remediation;Patient/family education    PT Goals (Current goals can be found in the Care Plan section)  Acute Rehab PT Goals Patient Stated Goal: none stated PT Goal Formulation: With  patient Time For Goal Achievement: 10/08/24    Frequency Min 2X/week     Co-evaluation               AM-PAC PT 6 Clicks Mobility  Outcome Measure Help needed turning from your back to your side while in a flat bed without using bedrails?: A Lot Help needed moving from lying on your back to sitting on the side of a flat bed without using bedrails?: A Lot Help needed moving to and from a bed to a chair (including a wheelchair)?: A Lot Help needed standing up from a chair using your arms (e.g., wheelchair or bedside chair)?: A Lot Help needed to walk in hospital room?: A Lot Help needed climbing 3-5 steps with a railing? : Total 6 Click Score: 11    End of Session   Activity Tolerance: Patient limited by fatigue Patient left: in chair;with call bell/phone within reach;with chair alarm set Nurse Communication: Mobility status PT Visit Diagnosis: Unsteadiness on feet (R26.81);Other abnormalities of gait and mobility (R26.89);Muscle weakness (generalized) (M62.81);Difficulty in walking, not elsewhere classified (R26.2);History of falling (Z91.81)    Time: 1030-1058 PT Time Calculation (min) (ACUTE ONLY): 28 min   Charges:   PT Evaluation $PT Eval Low Complexity: 1 Low PT Treatments $Therapeutic Activity: 8-22 mins PT General Charges $$ ACUTE PT VISIT: 1 Visit         Koda Routon, PT, GCS 09/24/24,11:10 AM

## 2024-09-24 NOTE — Assessment & Plan Note (Signed)
 On Lipitor

## 2024-09-24 NOTE — Assessment & Plan Note (Signed)
BMI 29.52

## 2024-09-24 NOTE — Assessment & Plan Note (Signed)
Respiratory status stable. 

## 2024-09-24 NOTE — NC FL2 (Signed)
 " [place]  MEDICAID FL2 LEVEL OF CARE FORM     IDENTIFICATION  Patient Name: Barbara Wang Birthdate: 08-04-1966 Sex: female Admission Date (Current Location): 09/21/2024  [facility] [place] Number:  Chiropodist and Address:         Provider Number: [id number]  Attending Physician Name and Address:  Josette Ade, MD  Relative Name and Phone Number:       Current Level of Care: Hospital Recommended Level of Care: Skilled Nursing Facility Prior Approval Number:    Date Approved/Denied:   PASRR Number: [id number]  Discharge Plan: SNF    Current Diagnoses: Patient Active Problem List   Diagnosis Date Noted   Uncontrolled type 2 diabetes mellitus with hypoglycemia, with long-term current use of insulin  (HCC) 09/24/2024   Generalized weakness 09/24/2024   HTN (hypertension) 09/21/2024   HLD (hyperlipidemia) 09/21/2024   Overweight (BMI 25.0-29.9) 09/21/2024   Hypertensive urgency 09/21/2024   Uncontrolled type 2 diabetes mellitus with hyperglycemia (HCC) 09/19/2024   Acute lower UTI 09/19/2024   Dyslipidemia 09/19/2024   Anxiety and depression 09/19/2024   Peripheral neuropathy 09/19/2024   AKI (acute kidney injury) 09/19/2024   Asthma, chronic 09/19/2024   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   UTI (urinary tract infection) 12/29/2018   Acute pyelonephritis 12/28/2018   Asthma 03/16/2017   Hyperosmolar hyperglycemic state (HHS) (HCC) 07/02/2016   Hypovolemia due to dehydration 02/05/2016   Obesity    Hypokalemia 05/09/2012    Orientation RESPIRATION BLADDER Height & Weight     Self, Place  Normal Incontinent Weight: 78 kg Height:  5' 4 (162.6 cm)  BEHAVIORAL SYMPTOMS/MOOD NEUROLOGICAL BOWEL NUTRITION STATUS      Incontinent Diet  AMBULATORY STATUS COMMUNICATION OF NEEDS Skin   Limited Assist Verbally Normal                       Personal Care Assistance Level of Assistance  Dressing     Dressing Assistance: Limited assistance     Functional Limitations Info             SPECIAL CARE FACTORS FREQUENCY  PT (By licensed PT), OT (By licensed OT)     PT Frequency: 3 X/Week OT Frequency: 3X/Week            Contractures Contractures Info: Not present    Additional Factors Info  Code Status Code Status Info: Full             Current Medications (09/24/2024):  This is the current hospital active medication list Current Facility-Administered  Medications  Medication Dose Route Frequency Provider Last Rate Last Admin   acetaminophen  (TYLENOL ) tablet 650 mg  650 mg Oral Q6H PRN Niu, Xilin, MD       albuterol  (PROVENTIL ) (2.5 MG/3ML) 0.083% nebulizer solution 2.5 mg  2.5 mg Nebulization Q4H PRN Niu, Xilin, MD       amLODipine  (NORVASC ) tablet 5 mg  5 mg Oral Daily Josette Ade, MD   5 mg at 09/24/24 1409   aspirin  EC tablet 81 mg  81 mg Oral Daily Niu, Xilin, MD   81 mg at 09/24/24 9188    atorvastatin  (LIPITOR) tablet 40 mg  40 mg Oral q1800 Niu, Xilin, MD   40 mg at 09/23/24 1705   bisacodyl  (DULCOLAX) suppository 10 mg  10 mg Rectal Daily PRN Tariq, Hassan, MD       dextromethorphan-guaiFENesin  (MUCINEX  DM) 30-600 MG per 12 hr tablet 1 tablet  1 tablet Oral BID PRN Niu, Xilin, MD       enoxaparin  (LOVENOX ) injection 40 mg  40 mg Subcutaneous Q24H Niu, Xilin, MD   40 mg at 09/24/24 9191   fluticasone  furoate-vilanterol (BREO ELLIPTA ) 100-25 MCG/ACT 1 puff  1 puff Inhalation Daily Niu, Xilin, MD   1 puff at 09/24/24 9188   gabapentin  (NEURONTIN ) capsule 600 mg  600 mg Oral Daily PRN Niu, Xilin, MD       hydrALAZINE  (APRESOLINE ) injection 10 mg  10 mg Intravenous Q2H PRN Niu, Xilin, MD   10 mg at 09/21/24 2233   insulin  aspart (novoLOG ) injection 0-5 Units  0-5 Units Subcutaneous QHS Tariq, Hassan, MD       insulin  aspart (novoLOG ) injection 0-9 Units  0-9 Units Subcutaneous TID WC Tariq, Hassan, MD   2 Units at 09/24/24 1305   loperamide  (IMODIUM ) capsule 2 mg  2 mg Oral QID PRN Niu, Xilin, MD       melatonin tablet 2.5 mg  2.5 mg Oral QHS Tariq, Hassan, MD   2.5 mg at 09/23/24 2216   nicotine  (NICODERM CQ  - dosed in mg/24 hours) patch 21 mg  21 mg Transdermal Daily Niu, Xilin, MD   21 mg at 09/24/24 9187   ondansetron  (ZOFRAN ) injection 4 mg  4 mg Intravenous Q8H PRN Niu, Xilin, MD       pantoprazole  (PROTONIX ) EC tablet 40 mg  40 mg Oral Daily Niu, Xilin, MD   40 mg at 09/24/24 0810   polyethylene glycol (MIRALAX  / GLYCOLAX ) packet 17 g  17 g Oral Daily Tariq, Hassan, MD   17 g at 09/24/24 0809   senna (SENOKOT) tablet 17.2 mg  2 tablet Oral BID Tariq, Hassan, MD   17.2 mg at 09/23/24 2220   sertraline  (ZOLOFT ) tablet 100 mg  100 mg Oral Daily Niu, Xilin, MD   100 mg at 09/24/24 9189   traZODone  (DESYREL ) tablet 300 mg  300 mg Oral QHS Niu, Xilin, MD   300 mg at 09/23/24 2209     Discharge Medications: Please see discharge summary for a list of discharge  medications.  Relevant Imaging Results:  Relevant Lab Results:   Additional Information: [id number]    Victory Jackquline RAMAN, RN     "

## 2024-09-24 NOTE — Hospital Course (Addendum)
 59 y.o. female with medical history significant of poorly controlled diabetes, HTN, HLD, asthma, depression with anxiety, brain tumor, neuropathy, chronic diarrhea, meningioma, who presented under unclear circumstances with a different story being told to different providers. As per admitting provider, patient presented with weakness, urinary frequency and dysuria. As per ED provider, daughter told EMS staff that patient was confused with hallucinating and that's why EMS was called. Patient reports to me that she presented with hyperglycemia. When I spoke with daughter on 1/26, she states EMS was called as patient noted to be confused with visual hallucinations (seeing extended family members walking around the house when none present).    Admitted for acute encephalopathy, hyperglycemia and possible UTI.  1/28.  Patient had hypoglycemia and her insulin  was held yesterday.  Patient had recent hospitalization with diabetic ketoacidosis.  Case discussed with diabetes coordinator and will hold long-acting insulin  today unless sugars start going above 200 (would only give half her usual dose).  Reassess tomorrow. 1/29.  Placed on 30 units of long-acting insulin  this morning.  Patient feeling better but still weak. 1/30.  Increase long-acting insulin  to 40 units daily and add 5 units plus sliding scale.

## 2024-09-24 NOTE — Assessment & Plan Note (Addendum)
 Replaced

## 2024-09-24 NOTE — Progress Notes (Signed)
 Occupational Therapy Treatment Patient Details Name: Barbara Wang MRN: [id number] DOB: [birth year] Today's Date: 09/24/2024   History of present illness 59 y.o. female with medical history significant of poorly controlled diabetes, HTN, HLD, asthma, depression with anxiety, brain tumor, neuropathy, chronic diarrhea, meningioma, who presents with weakness, urinary frequency and dysuria. Patient was recently hospitalized from 1/23 - 1/24 due to uncontrolled diabetes with glycemia. Patient was also found to have UTI.   OT comments  Pt making gradual progress towards goals, able to participate but does require cues for sequencing, initiation and problem solving. Grooming tasks seated in recliner, up to MOD A to problem-solve doffing/donning gown, and performs 3x STS trials using RW. Discharge recommendation appropriate, OT to follow acutely.       If plan is discharge home, recommend the following:  A lot of help with walking and/or transfers;A lot of help with bathing/dressing/bathroom;Assistance with cooking/housework;Assist for transportation;Help with stairs or ramp for entrance;Supervision due to cognitive status   Equipment Recommendations  Other (comment)       Precautions / Restrictions Precautions Precautions: Fall Recall of Precautions/Restrictions: Impaired Restrictions Weight Bearing Restrictions Per Provider Order: No       Mobility Bed Mobility Overal bed mobility: Needs Assistance             General bed mobility comments: NT, pt up and left in recliner    Transfers Overall transfer level: Needs assistance Equipment used: Rolling walker (2 wheels) Transfers: Sit to/from Stand             General transfer comment: 3x STS with MIN A     Balance Overall balance assessment: Needs assistance Sitting-balance support: Feet supported Sitting balance-Leahy Scale: Good   Postural control: Posterior lean Standing balance support: During functional  activity, Bilateral upper extremity supported, Reliant on assistive device for balance Standing balance-Leahy Scale: Fair Standing balance comment: increased postural sway without UE support                           ADL either performed or assessed with clinical judgement   ADL Overall ADL's : Needs assistance/impaired     Grooming: Wash/dry hands;Wash/dry face;Sitting Grooming Details (indicate cue type and reason): cues for task initiation, problem-solving and sequencing         Upper Body Dressing : Moderate assistance;Sitting Upper Body Dressing Details (indicate cue type and reason): difficulties problem solving gown (attempted to put arm through same sleeve twice)                 Functional mobility during ADLs: Minimal assistance;Rolling walker (2 wheels) General ADL Comments: pt participates in UB dressing, STS x3 with MIN A and grooming     Communication Communication Communication: No apparent difficulties Factors Affecting Communication: Difficulty expressing self   Cognition Arousal: Alert Behavior During Therapy: Flat affect Cognition: No family/caregiver present to determine baseline, Cognition impaired   Orientation impairments: Place, Time, Situation Awareness: Intellectual awareness impaired, Online awareness impaired   Attention impairment (select first level of impairment): Focused attention Executive functioning impairment (select all impairments): Initiation, Organization, Sequencing, Reasoning, Problem solving OT - Cognition Comments: improved ability to follow commands, does require cues for initiation and problem-solving                 Following commands: Impaired Following commands impaired: Follows one step commands with increased time      Cueing   Cueing Techniques: Verbal cues, Gestural cues, Visual cues  Pertinent Vitals/ Pain       Pain Assessment Pain Assessment: Faces Faces Pain Scale: No  hurt   Frequency  Min 2X/week        Progress Toward Goals  OT Goals(current goals can now be found in the care plan section)  Progress towards OT goals: Progressing toward goals  Acute Rehab OT Goals OT Goal Formulation: With patient Time For Goal Achievement: 10/07/24 Potential to Achieve Goals: Fair  Plan      AM-PAC OT 6 Clicks Daily Activity     Outcome Measure   Help from another person eating meals?: A Little Help from another person taking care of personal grooming?: A Little Help from another person toileting, which includes using toliet, bedpan, or urinal?: A Lot Help from another person bathing (including washing, rinsing, drying)?: A Lot Help from another person to put on and taking off regular upper body clothing?: A Lot Help from another person to put on and taking off regular lower body clothing?: A Lot 6 Click Score: 14    End of Session Equipment Utilized During Treatment: Rolling walker (2 wheels)  OT Visit Diagnosis: Unsteadiness on feet (R26.81);Muscle weakness (generalized) (M62.81)   Activity Tolerance Patient tolerated treatment well   Patient Left with chair alarm set;in chair;with call bell/phone within reach   Nurse Communication Mobility status        Time: 8445-8393 OT Time Calculation (min): 12 min  Charges: OT General Charges $OT Visit: 1 Visit OT Treatments $Self Care/Home Management : 8-22 mins  Micha Erck L. Aubreigh Fuerte, OTR/L  09/24/24, 4:54 PM

## 2024-09-24 NOTE — Assessment & Plan Note (Addendum)
 Increase long-acting insulin  40 units daily plus sliding scale.  With recent admission for DKA and then with low sugar this admission hard to determine what she needs currently.  Will slowly titrate up long-acting insulin  if remains high.

## 2024-09-24 NOTE — Progress Notes (Signed)
 " Progress Note   Patient: Barbara Wang FMW:[id number] DOB: 04-08-1966 DOA: 09/21/2024     0 DOS: the patient was seen and examined on 09/24/2024   Brief hospital course: 59 y.o. female with medical history significant of poorly controlled diabetes, HTN, HLD, asthma, depression with anxiety, brain tumor, neuropathy, chronic diarrhea, meningioma, who presented under unclear circumstances with a different story being told to different providers. As per admitting provider, patient presented with weakness, urinary frequency and dysuria. As per ED provider, daughter told EMS staff that patient was confused with hallucinating and that's why EMS was called. Patient reports to me that she presented with hyperglycemia. When I spoke with daughter on 1/26, she states EMS was called as patient noted to be confused with visual hallucinations (seeing extended family members walking around the house when none present).    Admitted for acute encephalopathy, hyperglycemia and possible UTI.  1/28.  Patient had hypoglycemia and her insulin  was held yesterday.  Patient had recent hospitalization with diabetic ketoacidosis.  Case discussed with diabetes coordinator and will hold long-acting insulin  today unless sugars start going above 200 (would only give half her usual dose).  Reassess tomorrow.  Assessment and Plan: * Uncontrolled type 2 diabetes mellitus with hypoglycemia, with long-term current use of insulin  (HCC) Holding long-acting insulin  today and blood sugars go above 200.  Short acting insulin  today.  If sugars go away above 200 can restart half her usual dose of long-acting insulin .  With recent admission for DKA and now with low sugar hard to determine what she needs currently.  Hypertensive urgency Started on lisinopril  and blood pressures improved but creatinine started to rise will hold lisinopril  currently give a fluid bolus and start Norvasc  tomorrow.  AKI (acute kidney injury) Will give a  fluid bolus and hold lisinopril .  HLD (hyperlipidemia) On Lipitor  Hypokalemia Replaced  UTI (urinary tract infection) Ruled out.  Urine culture with insignificant growth.  Will discontinue Rocephin .  Asthma, chronic Respiratory status stable  Anxiety and depression On Zoloft  and trazodone   Overweight (BMI 25.0-29.9) BMI 29.52  Generalized weakness PT recommending rehab        Subjective: Patient feeling weak.  Recent hospitalization for diabetic ketoacidosis had low sugar yesterday.  Off long-acting insulin  today and sugars still less than 200.  Physical Exam: Vitals:   09/24/24 0004 09/24/24 0407 09/24/24 0743 09/24/24 1157  BP: 105/67 (!) 141/77 (!) 146/85 (!) 172/68  Pulse: 91 94 90 63  Resp: 18 17 18 18   Temp: 99.1 F (37.3 C) 98 F (36.7 C) 98 F (36.7 C) 98.2 F (36.8 C)  TempSrc:   Oral Oral  SpO2: 94% 100% 95% 100%  Weight:      Height:       Physical Exam HENT:     Head: Normocephalic.  Eyes:     General: Lids are normal.  Cardiovascular:     Rate and Rhythm: Normal rate and regular rhythm.     Heart sounds: Normal heart sounds, S1 normal and S2 normal.  Pulmonary:     Breath sounds: No decreased breath sounds, wheezing, rhonchi or rales.  Abdominal:     Palpations: Abdomen is soft.     Tenderness: There is no abdominal tenderness.  Musculoskeletal:     Right lower leg: No swelling.     Left lower leg: No swelling.  Skin:    General: Skin is warm.     Findings: No rash.  Neurological:     Mental Status:  She is alert and oriented to person, place, and time.     Data Reviewed: Urine culture less than 10,000 colonies of insignificant growth. Creatinine 1.11, potassium 4.0  Family Communication: Spoke with daughter on the phone  Disposition: Status is: Observation PT recommended rehab  Planned Discharge Destination: Rehab    Time spent: 28 minutes  Author: Charlie Patterson, MD 09/24/2024 1:52 PM  For on call review  [email or web address]  "

## 2024-09-24 NOTE — Assessment & Plan Note (Signed)
PT recommending rehab.

## 2024-09-25 DIAGNOSIS — E785 Hyperlipidemia, unspecified: Secondary | ICD-10-CM | POA: Diagnosis not present

## 2024-09-25 DIAGNOSIS — I160 Hypertensive urgency: Secondary | ICD-10-CM | POA: Diagnosis not present

## 2024-09-25 DIAGNOSIS — N179 Acute kidney failure, unspecified: Secondary | ICD-10-CM | POA: Diagnosis not present

## 2024-09-25 DIAGNOSIS — E11649 Type 2 diabetes mellitus with hypoglycemia without coma: Secondary | ICD-10-CM | POA: Diagnosis not present

## 2024-09-25 LAB — URINE CULTURE: Culture: 10000 — AB

## 2024-09-25 LAB — BASIC METABOLIC PANEL WITH GFR
Anion gap: 8 (ref 5–15)
BUN: 16 mg/dL (ref 6–20)
CO2: 25 mmol/L (ref 22–32)
Calcium: 9.2 mg/dL (ref 8.9–10.3)
Chloride: 107 mmol/L (ref 98–111)
Creatinine, Ser: 0.94 mg/dL (ref 0.44–1.00)
GFR, Estimated: >60 mL/min
Glucose, Bld: 287 mg/dL — ABNORMAL HIGH (ref 70–99)
Potassium: 4 mmol/L (ref 3.5–5.1)
Sodium: 140 mmol/L (ref 135–145)

## 2024-09-25 LAB — GLUCOSE, CAPILLARY
Glucose-Capillary: 286 mg/dL — ABNORMAL HIGH (ref 70–99)
Glucose-Capillary: 334 mg/dL — ABNORMAL HIGH (ref 70–99)
Glucose-Capillary: 225 mg/dL — ABNORMAL HIGH (ref 70–99)
Glucose-Capillary: 299 mg/dL — ABNORMAL HIGH (ref 70–99)

## 2024-09-25 MED ORDER — LISINOPRIL 10 MG PO TABS
10.0000 mg | ORAL_TABLET | Freq: Every day | ORAL | Status: DC
  Administered 2024-09-25 – 2024-09-26 (×2): 10 mg via ORAL
  Filled 2024-09-25 (×2): qty 1

## 2024-09-25 MED ORDER — INSULIN GLARGINE-YFGN 100 UNIT/ML ~~LOC~~ SOLN
30.0000 [IU] | Freq: Every day | SUBCUTANEOUS | Status: DC
  Administered 2024-09-25 – 2024-09-26 (×2): 30 [IU] via SUBCUTANEOUS
  Filled 2024-09-25 (×2): qty 0.3

## 2024-09-25 NOTE — Inpatient Diabetes Management (Signed)
 Inpatient Diabetes Program Recommendations  AACE/ADA: New Consensus Statement on Inpatient Glycemic Control (2015)  Target Ranges:  Prepandial:   less than 140 mg/dL      Peak postprandial:   less than 180 mg/dL (1-2 hours)      Critically ill patients:  140 - 180 mg/dL    Latest Reference Range & Units 09/24/24 07:44 09/24/24 11:59 09/24/24 16:18 09/24/24 19:40  Glucose-Capillary 70 - 99 mg/dL 844 (H)  2 units Novolog   192 (H)  2 units Novolog   207 (H)  3 units Novolog   270 (H)  3 units Novolog    (H): Data is abnormally high  Latest Reference Range & Units 09/25/24 08:15  Glucose-Capillary 70 - 99 mg/dL 713 (H)  (H): Data is abnormally high   Home DM Meds: Tresiba  65 units daily                              Humalog  10 units TID with meals                              Ozempic  1 mg Qweek                              FSL3 CGM   Current Orders: Novolog  Sensitive Correction Scale/ SSI (0-9 units) TID AC + HS   MD- Note CBG now 286 this AM  Please consider starting 50% home dose basal insulin :   Lantus  30 units daily (please start this AM)    --Will follow patient during hospitalization--  Adina Rudolpho Arrow RN, MSN, CDCES Diabetes Coordinator Inpatient Glycemic Control Team Team Pager: [phone] (8a-5p)

## 2024-09-25 NOTE — Plan of Care (Signed)
" °  Problem: Education: Goal: Ability to describe self-care measures that may prevent or decrease complications (Diabetes Survival Skills Education) will improve Outcome: Progressing   Problem: Coping: Goal: Ability to adjust to condition or change in health will improve Outcome: Progressing   Problem: Health Behavior/Discharge Planning: Goal: Ability to identify and utilize available resources and services will improve Outcome: Progressing   Problem: Metabolic: Goal: Ability to maintain appropriate glucose levels will improve Outcome: Progressing   Problem: Nutritional: Goal: Maintenance of adequate nutrition will improve Outcome: Progressing   Problem: Skin Integrity: Goal: Risk for impaired skin integrity will decrease Outcome: Progressing   Problem: Tissue Perfusion: Goal: Adequacy of tissue perfusion will improve Outcome: Progressing   Problem: Activity: Goal: Risk for activity intolerance will decrease Outcome: Progressing   "

## 2024-09-25 NOTE — Progress Notes (Signed)
 " Progress Note   Patient: Barbara Wang FMW:[id number] DOB: [birth year] DOA: 09/21/2024     0 DOS: the patient was seen and examined on 09/25/2024   Brief hospital course: 59 y.o. female with medical history significant of poorly controlled diabetes, HTN, HLD, asthma, depression with anxiety, brain tumor, neuropathy, chronic diarrhea, meningioma, who presented under unclear circumstances with a different story being told to different providers. As per admitting provider, patient presented with weakness, urinary frequency and dysuria. As per ED provider, daughter told EMS staff that patient was confused with hallucinating and that's why EMS was called. Patient reports to me that she presented with hyperglycemia. When I spoke with daughter on 1/26, she states EMS was called as patient noted to be confused with visual hallucinations (seeing extended family members walking around the house when none present).    Admitted for acute encephalopathy, hyperglycemia and possible UTI.  1/28.  Patient had hypoglycemia and her insulin  was held yesterday.  Patient had recent hospitalization with diabetic ketoacidosis.  Case discussed with diabetes coordinator and will hold long-acting insulin  today unless sugars start going above 200 (would only give half her usual dose).  Reassess tomorrow. 1/29.  Placed on 30 units of long-acting insulin  this morning.  Patient feeling better but still weak.  Assessment and Plan: * Uncontrolled type 2 diabetes mellitus with hypoglycemia, with long-term current use of insulin  (HCC) Restarting long-acting insulin  30 units daily plus sliding scale.  With recent admission for DKA and then with low sugar this admission hard to determine what she needs currently.  Will slowly titrate up long-acting insulin  if remains high.  Hypertensive urgency Continue Norvasc  10 mg daily lisinopril  10 mg daily and spironolactone  25 mg  AKI (acute kidney injury) Creatinine 0.79 on  presentation peaked at 1.11 and down to 0.94.  HLD (hyperlipidemia) On Lipitor  Hypokalemia Replaced  UTI (urinary tract infection) Ruled out.  Urine culture with insignificant growth.  Discontinued Rocephin  on 1/28.  Asthma, chronic Respiratory status stable  Anxiety and depression On Zoloft  and trazodone   Overweight (BMI 25.0-29.9) BMI 29.52  Generalized weakness PT recommending rehab        Subjective: Patient feels a little bit better today.  Appetite coming back.  Still weak.  Admitted with weakness and urinary frequency.  Physical Exam: Vitals:   09/24/24 1758 09/24/24 1954 09/25/24 0503 09/25/24 0812  BP: (!) 182/84 138/65 139/74 (!) 179/76  Pulse:  62 85 77  Resp:  20 20   Temp:  98 F (36.7 C) 98.2 F (36.8 C) 98.2 F (36.8 C)  TempSrc:    Oral  SpO2:  98% 96% 98%  Weight:      Height:       Physical Exam HENT:     Head: Normocephalic.  Eyes:     General: Lids are normal.  Cardiovascular:     Rate and Rhythm: Normal rate and regular rhythm.     Heart sounds: Normal heart sounds, S1 normal and S2 normal.  Pulmonary:     Breath sounds: No decreased breath sounds, wheezing, rhonchi or rales.  Abdominal:     Palpations: Abdomen is soft.     Tenderness: There is no abdominal tenderness.  Musculoskeletal:     Right lower leg: No swelling.     Left lower leg: No swelling.  Skin:    General: Skin is warm.     Findings: No rash.  Neurological:     Mental Status: She is alert and oriented to  person, place, and time.     Data Reviewed: Creatinine 0.94, electrolytes normal range  Family Communication: Spoke with daughter on the phone  Disposition: Status is: Observation TOC looking into rehab beds.  Restarting long-acting insulin  30 units daily today  Planned Discharge Destination: Rehab    Time spent: 28 minutes  Author: Charlie Patterson, MD 09/25/2024 12:24 PM  For on call review [email or web address].  "

## 2024-09-26 DIAGNOSIS — E785 Hyperlipidemia, unspecified: Secondary | ICD-10-CM | POA: Diagnosis not present

## 2024-09-26 DIAGNOSIS — N179 Acute kidney failure, unspecified: Secondary | ICD-10-CM | POA: Diagnosis not present

## 2024-09-26 DIAGNOSIS — I160 Hypertensive urgency: Secondary | ICD-10-CM | POA: Diagnosis not present

## 2024-09-26 DIAGNOSIS — E11649 Type 2 diabetes mellitus with hypoglycemia without coma: Secondary | ICD-10-CM | POA: Diagnosis not present

## 2024-09-26 DIAGNOSIS — R5383 Other fatigue: Secondary | ICD-10-CM | POA: Insufficient documentation

## 2024-09-26 LAB — GLUCOSE, CAPILLARY
Glucose-Capillary: 286 mg/dL — ABNORMAL HIGH (ref 70–99)
Glucose-Capillary: 313 mg/dL — ABNORMAL HIGH (ref 70–99)
Glucose-Capillary: 270 mg/dL — ABNORMAL HIGH (ref 70–99)

## 2024-09-26 LAB — MISC LABCORP TEST (SEND OUT)

## 2024-09-26 MED ORDER — INSULIN GLARGINE-YFGN 100 UNIT/ML ~~LOC~~ SOLN: 40.0000 [IU] | Freq: Every day | SUBCUTANEOUS | 0 refills | Status: AC

## 2024-09-26 MED ORDER — NICOTINE 21 MG/24HR TD PT24: 21.0000 mg | MEDICATED_PATCH | Freq: Every day | TRANSDERMAL | 0 refills | Status: AC

## 2024-09-26 MED ORDER — INSULIN ASPART 100 UNIT/ML IJ SOLN
5.0000 [IU] | Freq: Three times a day (TID) | INTRAMUSCULAR | Status: DC
  Administered 2024-09-26: 5 [IU] via SUBCUTANEOUS
  Filled 2024-09-26: qty 5

## 2024-09-26 MED ORDER — ACETAMINOPHEN 325 MG PO TABS: 650.0000 mg | ORAL_TABLET | Freq: Four times a day (QID) | ORAL | Status: AC | PRN

## 2024-09-26 MED ORDER — INSULIN GLARGINE-YFGN 100 UNIT/ML ~~LOC~~ SOLN: 40.0000 [IU] | Freq: Every day | SUBCUTANEOUS | Status: DC

## 2024-09-26 MED ORDER — SPIRONOLACTONE 25 MG PO TABS: 25.0000 mg | ORAL_TABLET | Freq: Every day | ORAL | 0 refills | Status: AC

## 2024-09-26 MED ORDER — SERTRALINE HCL 100 MG PO TABS: 100.0000 mg | ORAL_TABLET | Freq: Every day | ORAL | Status: AC

## 2024-09-26 MED ORDER — AMLODIPINE BESYLATE 10 MG PO TABS: 10.0000 mg | ORAL_TABLET | Freq: Every day | ORAL | 0 refills | Status: AC

## 2024-09-26 MED ORDER — TRAZODONE HCL 50 MG PO TABS: 50.0000 mg | ORAL_TABLET | Freq: Every evening | ORAL | Status: DC | PRN

## 2024-09-26 MED ORDER — POLYETHYLENE GLYCOL 3350 17 G PO PACK: 17.0000 g | PACK | Freq: Every day | ORAL | 0 refills | Status: AC | PRN

## 2024-09-26 MED ORDER — INSULIN ASPART 100 UNIT/ML FLEXPEN: 5.0000 [IU] | PEN_INJECTOR | Freq: Three times a day (TID) | SUBCUTANEOUS | 0 refills | Status: AC

## 2024-09-26 MED ORDER — INSULIN GLARGINE-YFGN 100 UNIT/ML ~~LOC~~ SOLN
10.0000 [IU] | Freq: Once | SUBCUTANEOUS | Status: AC
  Administered 2024-09-26: 10 [IU] via SUBCUTANEOUS
  Filled 2024-09-26: qty 0.1

## 2024-09-26 MED ORDER — LISINOPRIL 10 MG PO TABS: 10.0000 mg | ORAL_TABLET | Freq: Every day | ORAL | 0 refills | Status: AC

## 2024-09-26 MED ORDER — TRAZODONE HCL 50 MG PO TABS: 50.0000 mg | ORAL_TABLET | Freq: Every evening | ORAL | 0 refills | Status: AC | PRN

## 2024-09-26 NOTE — TOC Progression Note (Addendum)
 Transition of Care [facility]) - Progression Note    Patient Details  Name: Barbara Wang MRN: [id number] Date of Birth: 1966-06-13  Transition of Care [facility]) CM/SW Contact  Grayce JAYSON Perfect, RN Phone Number: 09/26/2024, 12:42 PM  Clinical Narrative:   RNCM spoke with patient in her room, she is agreeable to [facility] in [place] for short term rehab when medically stable.  RNCM accepted bed in hub.  Facility to obtain authorization from [facility] per [place].  She will notify me when authorization obtained.  RN left message with daughter, [name], notifying her of name and number to facility.    Expected Discharge Plan: Skilled Nursing Facility Barriers to Discharge: Continued Medical Work up               Expected Discharge Plan and Services       Living arrangements for the past 2 months: Single Family Home                                       Social Drivers of Health (SDOH) Interventions SDOH Screenings   Food Insecurity: No Food Insecurity (09/23/2024)  Recent Concern: Food Insecurity - Food Insecurity Present (09/19/2024)  Housing: High Risk (09/23/2024)  Transportation Needs: Unmet Transportation Needs (09/23/2024)  Utilities: Not At Risk (09/23/2024)  Recent Concern: Utilities - At Risk (09/19/2024)  Depression (PHQ2-9): High Risk (09/24/2023)  Financial Resource Strain: Low Risk (07/31/2023)  Physical Activity: Inactive (07/31/2023)  Stress: No Stress Concern Present (07/31/2023)  Tobacco Use: High Risk (09/21/2024)  Health Literacy: Adequate Health Literacy (07/31/2023)    Readmission Risk Interventions     No data to display

## 2024-09-26 NOTE — Inpatient Diabetes Management (Addendum)
 Inpatient Diabetes Program Recommendations  AACE/ADA: New Consensus Statement on Inpatient Glycemic Control (2015)  Target Ranges:  Prepandial:   less than 140 mg/dL      Peak postprandial:   less than 180 mg/dL (1-2 hours)      Critically ill patients:  140 - 180 mg/dL    Latest Reference Range & Units 09/25/24 08:15 09/25/24 12:08 09/25/24 15:52 09/25/24 22:03  Glucose-Capillary 70 - 99 mg/dL 713 (H)  5 units Novolog   30 units Semglee  @0956   334 (H)  7 units Novolog   225 (H)  3 units Novolog   299 (H)  3 units Novolog    (H): Data is abnormally high  Latest Reference Range & Units 09/26/24 07:53  Glucose-Capillary 70 - 99 mg/dL 713 (H)  (H): Data is abnormally high   Home DM Meds: Tresiba  65 units daily                              Humalog  10 units TID with meals                              Ozempic  1 mg Qweek                              FSL3 CGM   Current Orders: Novolog  Sensitive Correction Scale/ SSI (0-9 units) TID AC + HS     Semglee  30 units daily    MD- Please consider:  1. Increase Semglee  to 40 units daily (If 30 unit dose already given, please also give an extra 10 units this AM)  2. Start Novolog  Meal Coverage: Novolog  5 units TID with meals (50% home dose) HOLD if pt NPO HOLD if pt eats <50% meals    --Will follow patient during hospitalization--  Adina Rudolpho Arrow RN, MSN, CDCES Diabetes Coordinator Inpatient Glycemic Control Team Team Pager: [phone] (8a-5p)

## 2024-09-26 NOTE — Progress Notes (Signed)
 The report was provided to [place] at [facility]. We reviewed the patient's history and the reason for admission. Transportation is scheduled to arrive around 6:00 PM.

## 2024-09-26 NOTE — TOC Progression Note (Addendum)
 Transition of Care [facility]) - Progression Note    Patient Details  Name: Barbara Wang MRN: [id number] Date of Birth: [birth year]  Transition of Care [facility]) CM/SW Contact  Dalia GORMAN Fuse, RN Phone Number: 09/26/2024, 3:19 PM  Clinical Narrative:    TOC received call from [place] with [facility] in [place], they can admit the patient today if the dc summary is received before 3:30 PM. Grayce spoke with [facility] and was told they are unable to accept any additional patients today due to being at capacity. [facility] is unable to accept the patient tomorrow because the RX is closed. She doesn't anticipate the RX will be open on Monday due to the weather. TOC spoke with [facility] supervisor about using alternative transportation. [facility] was called and can transport the patient between 5:30 and 6:00 PM today.    Expected Discharge Plan: Skilled Nursing Facility Barriers to Discharge: Continued Medical Work up               Expected Discharge Plan and Services       Living arrangements for the past 2 months: Single Family Home                                       Social Drivers of Health (SDOH) Interventions SDOH Screenings   Food Insecurity: No Food Insecurity (09/23/2024)  Recent Concern: Food Insecurity - Food Insecurity Present (09/19/2024)  Housing: High Risk (09/23/2024)  Transportation Needs: Unmet Transportation Needs (09/23/2024)  Utilities: Not At Risk (09/23/2024)  Recent Concern: Utilities - At Risk (09/19/2024)  Depression (PHQ2-9): High Risk (09/24/2023)  Financial Resource Strain: Low Risk (07/31/2023)  Physical Activity: Inactive (07/31/2023)  Stress: No Stress Concern Present (07/31/2023)  Tobacco Use: High Risk (09/21/2024)  Health Literacy: Adequate Health Literacy (07/31/2023)    Readmission Risk Interventions     No data to display

## 2024-09-26 NOTE — Progress Notes (Signed)
 Transport here for patient, they are awaiting outside. all personal belonging place in bag. She was wheeled out via wheelchair by Nurse Tech. Left  the unit with her cell phone in her hand

## 2024-09-26 NOTE — Progress Notes (Signed)
 Occupational Therapy Treatment Patient Details Name: Barbara Wang MRN: [id number] DOB: 14-Nov-1965 Today's Date: 09/26/2024   History of present illness 59 y.o. female with medical history significant of poorly controlled diabetes, HTN, HLD, asthma, depression with anxiety, brain tumor, neuropathy, chronic diarrhea, meningioma, who presents with weakness, urinary frequency and dysuria. Patient was recently hospitalized from 1/23 - 1/24 due to uncontrolled diabetes with glycemia. Patient was also found to have UTI.   OT comments  Patient seen for OT treatment on this date. Upon arrival to room patient sitting on [facility] with PT, patient required increased time/assist to perform voiding/hygiene Patient able to ambulate ~15 feet with r/w to recliner with min A x 1. Once seated in recliner, patient performed 2 more sit<>stands with CGA for continued perineal hygiene. Patient ended tx in recliner with all needs within reach and chair alarm on. Patient making good progress toward goals, will continue to follow POC. Discharge recommendation remains appropriate.        If plan is discharge home, recommend the following:  A lot of help with walking and/or transfers;A lot of help with bathing/dressing/bathroom;Assistance with cooking/housework;Assist for transportation;Help with stairs or ramp for entrance;Supervision due to cognitive status   Equipment Recommendations  Other (comment)    Recommendations for Other Services      Precautions / Restrictions Precautions Precautions: Fall Recall of Precautions/Restrictions: Impaired Restrictions Weight Bearing Restrictions Per Provider Order: No       Mobility Bed Mobility               General bed mobility comments: OOB pre/post tx    Transfers Overall transfer level: Needs assistance Equipment used: Rolling walker (2 wheels) Transfers: Bed to chair/wheelchair/BSC, Sit to/from Stand Sit to Stand: Min assist Stand pivot transfers:  Min assist, Mod assist               Balance Overall balance assessment: Needs assistance Sitting-balance support: Feet supported Sitting balance-Leahy Scale: Good                                     ADL either performed or assessed with clinical judgement   ADL Overall ADL's : Needs assistance/impaired                     Lower Body Dressing: Contact guard assist;Sitting/lateral leans Lower Body Dressing Details (indicate cue type and reason): able to doff/don socks while sitting in figure 4, encouragement to perform Toilet Transfer: Minimal assistance;Stand-pivot;Rolling walker (2 wheels) Toilet Transfer Details (indicate cue type and reason): mod A to transfer to [facility], min A to transfer off of BSC Toileting- Clothing Manipulation and Hygiene: Moderate assistance;Sit to/from stand Toileting - Clothing Manipulation Details (indicate cue type and reason): A for thoroughness with perineal hygiene            Extremity/Trunk Assessment Upper Extremity Assessment Upper Extremity Assessment: Generalized weakness   Lower Extremity Assessment Lower Extremity Assessment: Defer to PT evaluation        Vision       Perception     Praxis     Communication Communication Communication: No apparent difficulties   Cognition Arousal: Alert Behavior During Therapy: WFL for tasks assessed/performed Cognition: No family/caregiver present to determine baseline, Cognition impaired     Awareness: Online awareness impaired     Executive functioning impairment (select all impairments): Reasoning, Problem solving OT - Cognition Comments: improved ability  to follow commands, does require cues for initiation and problem-solving                 Following commands: Impaired Following commands impaired: Follows one step commands with increased time      Cueing   Cueing Techniques: Verbal cues, Gestural cues, Visual cues  Exercises      Shoulder  Instructions       General Comments      Pertinent Vitals/ Pain       Pain Assessment Pain Assessment: No/denies pain  Home Living                                          Prior Functioning/Environment              Frequency  Min 2X/week        Progress Toward Goals  OT Goals(current goals can now be found in the care plan section)  Progress towards OT goals: Progressing toward goals  Acute Rehab OT Goals Patient Stated Goal: to get stronger OT Goal Formulation: With patient Time For Goal Achievement: 10/07/24 Potential to Achieve Goals: Fair ADL Goals Pt Will Perform Grooming: with min assist;standing Pt Will Perform Lower Body Dressing: with min assist;sit to/from stand Pt Will Transfer to Toilet: with min assist;ambulating Pt Will Perform Toileting - Clothing Manipulation and hygiene: with min assist;sit to/from stand  Plan      Co-evaluation                 AM-PAC OT 6 Clicks Daily Activity     Outcome Measure   Help from another person eating meals?: A Little Help from another person taking care of personal grooming?: A Little Help from another person toileting, which includes using toliet, bedpan, or urinal?: A Lot Help from another person bathing (including washing, rinsing, drying)?: A Lot Help from another person to put on and taking off regular upper body clothing?: A Lot Help from another person to put on and taking off regular lower body clothing?: A Lot 6 Click Score: 14    End of Session Equipment Utilized During Treatment: Rolling walker (2 wheels)  OT Visit Diagnosis: Unsteadiness on feet (R26.81);Muscle weakness (generalized) (M62.81)   Activity Tolerance Patient tolerated treatment well   Patient Left with chair alarm set;in chair;with call bell/phone within reach   Nurse Communication Mobility status        Time: 8584-8565 OT Time Calculation (min): 19 min  Charges: OT General Charges $OT Visit: 1  Visit OT Treatments $Self Care/Home Management : 8-22 mins  Rogers Clause, OT/L MSOT, 09/26/2024

## 2024-09-26 NOTE — Progress Notes (Signed)
 " Progress Note   Patient: Barbara Wang FMW:[id number] DOB: 1966/02/03 DOA: 09/21/2024     0 DOS: the patient was seen and examined on 09/26/2024   Brief hospital course: 59 y.o. female with medical history significant of poorly controlled diabetes, HTN, HLD, asthma, depression with anxiety, brain tumor, neuropathy, chronic diarrhea, meningioma, who presented under unclear circumstances with a different story being told to different providers. As per admitting provider, patient presented with weakness, urinary frequency and dysuria. As per ED provider, daughter told EMS staff that patient was confused with hallucinating and that's why EMS was called. Patient reports to me that she presented with hyperglycemia. When I spoke with daughter on 1/26, she states EMS was called as patient noted to be confused with visual hallucinations (seeing extended family members walking around the house when none present).    Admitted for acute encephalopathy, hyperglycemia and possible UTI.  1/28.  Patient had hypoglycemia and her insulin  was held yesterday.  Patient had recent hospitalization with diabetic ketoacidosis.  Case discussed with diabetes coordinator and will hold long-acting insulin  today unless sugars start going above 200 (would only give half her usual dose).  Reassess tomorrow. 1/29.  Placed on 30 units of long-acting insulin  this morning.  Patient feeling better but still weak.  Assessment and Plan: * Uncontrolled type 2 diabetes mellitus with hypoglycemia, with long-term current use of insulin  (HCC) Increase long-acting insulin  40 units daily plus sliding scale.  With recent admission for DKA and then with low sugar this admission hard to determine what she needs currently.  Will slowly titrate up long-acting insulin  if remains high.  Hypertensive urgency Blood pressure improved.  Continue Norvasc  10 mg daily lisinopril  10 mg daily and spironolactone  25 mg  AKI (acute kidney  injury) Creatinine 0.79 on presentation peaked at 1.11 and down to 0.94.  HLD (hyperlipidemia) On Lipitor  Hypokalemia Replaced  UTI (urinary tract infection) Ruled out.  Urine culture with insignificant growth.  Discontinued Rocephin  on 1/28.  Asthma, chronic Respiratory status stable  Anxiety and depression On Zoloft .  Lower the dose of trazodone  and make as needed for insomnia because she was lethargic this morning.  Overweight (BMI 25.0-29.9) BMI 29.52  Lethargy Likely from big dose of trazodone  last night.  Cut back dose and make as needed  Generalized weakness PT recommending rehab        Subjective: Patient sleeping this morning.  She received big dose of trazodone  last night.  Will lower the dose and make as needed.  Admitted with weakness.  Physical therapy recommending rehab  Physical Exam: Vitals:   09/25/24 1551 09/25/24 2013 09/26/24 0352 09/26/24 0752  BP: 134/64 129/65 (!) 117/57 120/64  Pulse: 70 73 65 72  Resp:    16  Temp: 98.4 F (36.9 C) 98.7 F (37.1 C) 98.6 F (37 C) 98.3 F (36.8 C)  TempSrc:  Oral Oral   SpO2: 97% 93% 95% 93%  Weight:      Height:       Physical Exam HENT:     Head: Normocephalic.  Eyes:     General: Lids are normal.  Cardiovascular:     Rate and Rhythm: Normal rate and regular rhythm.     Heart sounds: Normal heart sounds, S1 normal and S2 normal.  Pulmonary:     Breath sounds: No decreased breath sounds, wheezing, rhonchi or rales.  Abdominal:     Palpations: Abdomen is soft.     Tenderness: There is no abdominal tenderness.  Musculoskeletal:     Right lower leg: No swelling.     Left lower leg: No swelling.  Skin:    General: Skin is warm.     Findings: No rash.  Neurological:     Mental Status: She is lethargic.     Data Reviewed: Labs creatinine 0.94  Family Communication: Spoke with daughter on the phone  Disposition: Status is: Observation Looks like we found a rehab bed and I will start  insurance authorization.  Planned Discharge Destination: Rehab    Time spent: 28 minutes  Author: Charlie Patterson, MD 09/26/2024 1:06 PM  For on call review [email or web address].  "

## 2024-09-26 NOTE — Assessment & Plan Note (Signed)
 Likely from big dose of trazodone  last night.  Cut back dose and make as needed

## 2024-09-26 NOTE — Plan of Care (Signed)

## 2024-09-26 NOTE — Plan of Care (Signed)
  Problem: Education: Goal: Ability to describe self-care measures that may prevent or decrease complications (Diabetes Survival Skills Education) will improve Outcome: Progressing Goal: Individualized Educational Video(s) Outcome: Progressing   Problem: Coping: Goal: Ability to adjust to condition or change in health will improve Outcome: Progressing   Problem: Fluid Volume: Goal: Ability to maintain a balanced intake and output will improve Outcome: Progressing   Problem: Metabolic: Goal: Ability to maintain appropriate glucose levels will improve Outcome: Progressing   Problem: Nutritional: Goal: Maintenance of adequate nutrition will improve Outcome: Progressing Goal: Progress toward achieving an optimal weight will improve Outcome: Progressing   Problem: Skin Integrity: Goal: Risk for impaired skin integrity will decrease Outcome: Progressing   Problem: Tissue Perfusion: Goal: Adequacy of tissue perfusion will improve Outcome: Progressing

## 2024-09-26 NOTE — Progress Notes (Signed)
 Physical Therapy Treatment Patient Details Name: Barbara Wang MRN: [id number] DOB: 03/30/1966 Today's Date: 09/26/2024   History of Present Illness 59 y.o. female with medical history significant of poorly controlled diabetes, HTN, HLD, asthma, depression with anxiety, brain tumor, neuropathy, chronic diarrhea, meningioma, who presents with weakness, urinary frequency and dysuria. Patient was recently hospitalized from 1/23 - 1/24 due to uncontrolled diabetes with glycemia. Patient was also found to have UTI.    PT Comments  Patient wakes to voice, alert, oriented to self, place, denied pain. Pt required modA for bed mobility, extra time. Sit <> stand and stand pivot to [facility] modA. Several transfers performed, improved to minA for steadying with RW. She was able to ambulate ~18ft with RW and CGA-minA, R lateral lean noted with some fatigue. Pt left with OT at bedside. The patient would benefit from further skilled PT intervention to continue to progress towards goals. Recommendation remains appropriate.      If plan is discharge home, recommend the following: A lot of help with walking and/or transfers;A lot of help with bathing/dressing/bathroom;Assistance with cooking/housework;Assist for transportation;Help with stairs or ramp for entrance   Can travel by private vehicle     No  Equipment Recommendations  Other (comment) (TBD)    Recommendations for Other Services       Precautions / Restrictions Precautions Precautions: Fall Recall of Precautions/Restrictions: Impaired Restrictions Weight Bearing Restrictions Per Provider Order: No     Mobility  Bed Mobility Overal bed mobility: Needs Assistance Bed Mobility: Supine to Sit     Supine to sit: Mod assist          Transfers Overall transfer level: Needs assistance Equipment used: Rolling walker (2 wheels) Transfers: Bed to chair/wheelchair/BSC, Sit to/from Stand Sit to Stand: Mod assist, Min assist   Step pivot  transfers: Min assist, Mod assist       General transfer comment: initially modA to step pivot to [facility], improved to minA    Ambulation/Gait Ambulation/Gait assistance: Contact guard assist, Min assist Gait Distance (Feet): 10 Feet Assistive device: Rolling walker (2 wheels)   Gait velocity: decr     General Gait Details: occasional R lateral lean, fatigued   Stairs             Wheelchair Mobility     Tilt Bed    Modified Rankin (Stroke Patients Only)       Balance Overall balance assessment: Needs assistance Sitting-balance support: Feet supported Sitting balance-Leahy Scale: Good     Standing balance support: Bilateral upper extremity supported Standing balance-Leahy Scale: Fair                              Hotel Manager: No apparent difficulties Factors Affecting Communication: Difficulty expressing self  Cognition Arousal: Alert Behavior During Therapy: WFL for tasks assessed/performed   PT - Cognitive impairments: Problem solving, Safety/Judgement                         Following commands: Impaired Following commands impaired: Follows one step commands with increased time    Cueing Cueing Techniques: Verbal cues, Gestural cues, Visual cues  Exercises Other Exercises Other Exercises: able to sit and perform pericare, needed assistance to ensure cleanliness maxA    General Comments        Pertinent Vitals/Pain Pain Assessment Pain Assessment: No/denies pain    Home Living  Prior Function            PT Goals (current goals can now be found in the care plan section) Progress towards PT goals: Progressing toward goals    Frequency    Min 2X/week      PT Plan      Co-evaluation              AM-PAC PT 6 Clicks Mobility   Outcome Measure  Help needed turning from your back to your side while in a flat bed without using bedrails?: A  Lot Help needed moving from lying on your back to sitting on the side of a flat bed without using bedrails?: A Lot Help needed moving to and from a bed to a chair (including a wheelchair)?: A Lot Help needed standing up from a chair using your arms (e.g., wheelchair or bedside chair)?: A Little Help needed to walk in hospital room?: A Little Help needed climbing 3-5 steps with a railing? : Total 6 Click Score: 13    End of Session   Activity Tolerance: Patient tolerated treatment well Patient left: in chair;with call bell/phone within reach;Other (comment) (OT in room) Nurse Communication: Mobility status PT Visit Diagnosis: Unsteadiness on feet (R26.81);Other abnormalities of gait and mobility (R26.89);Muscle weakness (generalized) (M62.81);Difficulty in walking, not elsewhere classified (R26.2);History of falling (Z91.81)     Time: 8594-8572 PT Time Calculation (min) (ACUTE ONLY): 22 min  Charges:    $Therapeutic Activity: 8-22 mins PT General Charges $$ ACUTE PT VISIT: 1 Visit                     Doyal Shams PT, DPT 4:01 PM,09/26/24

## 2024-09-29 ENCOUNTER — Other Ambulatory Visit: Payer: Self-pay

## 2024-09-29 LAB — SUSCEPTIBILITY, AER + ANAEROB

## 2024-09-29 LAB — SUSCEPTIBILITY RESULT
